# Patient Record
Sex: Male | Born: 1945
Health system: Southern US, Community
[De-identification: ages and names within clinical notes are randomized; demographics above are authoritative.]

## PROBLEM LIST (undated history)

## (undated) DIAGNOSIS — I5042 Chronic combined systolic (congestive) and diastolic (congestive) heart failure: Secondary | ICD-10-CM

## (undated) DIAGNOSIS — I255 Ischemic cardiomyopathy: Secondary | ICD-10-CM

## (undated) DIAGNOSIS — Z951 Presence of aortocoronary bypass graft: Secondary | ICD-10-CM

## (undated) DIAGNOSIS — I4892 Unspecified atrial flutter: Secondary | ICD-10-CM

## (undated) DIAGNOSIS — I4819 Other persistent atrial fibrillation: Secondary | ICD-10-CM

## (undated) DIAGNOSIS — I5022 Chronic systolic (congestive) heart failure: Secondary | ICD-10-CM

## (undated) DIAGNOSIS — K819 Cholecystitis, unspecified: Secondary | ICD-10-CM

## (undated) DIAGNOSIS — I071 Rheumatic tricuspid insufficiency: Secondary | ICD-10-CM

## (undated) DIAGNOSIS — I2510 Atherosclerotic heart disease of native coronary artery without angina pectoris: Secondary | ICD-10-CM

## (undated) DIAGNOSIS — E785 Hyperlipidemia, unspecified: Secondary | ICD-10-CM

## (undated) DIAGNOSIS — G4733 Obstructive sleep apnea (adult) (pediatric): Secondary | ICD-10-CM

## (undated) DIAGNOSIS — N200 Calculus of kidney: Secondary | ICD-10-CM

## (undated) DIAGNOSIS — I119 Hypertensive heart disease without heart failure: Secondary | ICD-10-CM

## (undated) DIAGNOSIS — I340 Nonrheumatic mitral (valve) insufficiency: Secondary | ICD-10-CM

## (undated) HISTORY — DX: Ischemic cardiomyopathy: I25.5

## (undated) HISTORY — DX: Nonrheumatic mitral (valve) insufficiency: I34.0

## (undated) HISTORY — DX: Rheumatic tricuspid insufficiency: I07.1

## (undated) HISTORY — PX: CARDIOVERSION: SHX1299

## (undated) HISTORY — DX: Chronic combined systolic (congestive) and diastolic (congestive) heart failure: I50.42

## (undated) HISTORY — DX: Obstructive sleep apnea (adult) (pediatric): G47.33

## (undated) HISTORY — DX: Presence of aortocoronary bypass graft: Z95.1

---

## 2001-08-04 HISTORY — PX: CARDIAC CATHETERIZATION: SHX172

## 2002-02-14 ENCOUNTER — Inpatient Hospital Stay (HOSPITAL_COMMUNITY): Admission: EM | Admit: 2002-02-14 | Discharge: 2002-02-19 | Payer: Self-pay | Admitting: Cardiology

## 2002-02-15 ENCOUNTER — Encounter: Payer: Self-pay | Admitting: Thoracic Surgery (Cardiothoracic Vascular Surgery)

## 2002-02-16 ENCOUNTER — Encounter: Payer: Self-pay | Admitting: Thoracic Surgery (Cardiothoracic Vascular Surgery)

## 2002-02-16 DIAGNOSIS — Z951 Presence of aortocoronary bypass graft: Secondary | ICD-10-CM

## 2002-02-16 HISTORY — PX: CORONARY ARTERY BYPASS GRAFT: SHX141

## 2002-02-16 HISTORY — DX: Presence of aortocoronary bypass graft: Z95.1

## 2002-02-17 ENCOUNTER — Encounter: Payer: Self-pay | Admitting: Thoracic Surgery (Cardiothoracic Vascular Surgery)

## 2002-02-18 ENCOUNTER — Encounter: Payer: Self-pay | Admitting: Thoracic Surgery (Cardiothoracic Vascular Surgery)

## 2012-07-08 DIAGNOSIS — E7800 Pure hypercholesterolemia, unspecified: Secondary | ICD-10-CM | POA: Diagnosis not present

## 2012-07-08 DIAGNOSIS — Z125 Encounter for screening for malignant neoplasm of prostate: Secondary | ICD-10-CM | POA: Diagnosis not present

## 2012-07-08 DIAGNOSIS — I259 Chronic ischemic heart disease, unspecified: Secondary | ICD-10-CM | POA: Diagnosis not present

## 2012-07-08 DIAGNOSIS — F17200 Nicotine dependence, unspecified, uncomplicated: Secondary | ICD-10-CM | POA: Diagnosis not present

## 2013-08-03 DIAGNOSIS — Z9119 Patient's noncompliance with other medical treatment and regimen: Secondary | ICD-10-CM | POA: Diagnosis not present

## 2013-08-03 DIAGNOSIS — I259 Chronic ischemic heart disease, unspecified: Secondary | ICD-10-CM | POA: Diagnosis not present

## 2013-08-03 DIAGNOSIS — F17200 Nicotine dependence, unspecified, uncomplicated: Secondary | ICD-10-CM | POA: Diagnosis not present

## 2013-08-03 DIAGNOSIS — Z125 Encounter for screening for malignant neoplasm of prostate: Secondary | ICD-10-CM | POA: Diagnosis not present

## 2014-05-16 ENCOUNTER — Inpatient Hospital Stay (HOSPITAL_COMMUNITY)
Admission: EM | Admit: 2014-05-16 | Discharge: 2014-05-18 | DRG: 292 | Disposition: A | Discharge status: Home / Self Care | Payer: Medicare Other | Attending: Cardiovascular Disease | Admitting: Cardiovascular Disease

## 2014-05-16 ENCOUNTER — Emergency Department (HOSPITAL_COMMUNITY): Payer: Medicare Other

## 2014-05-16 ENCOUNTER — Encounter (HOSPITAL_COMMUNITY): Payer: Self-pay | Admitting: Physician Assistant

## 2014-05-16 DIAGNOSIS — I252 Old myocardial infarction: Secondary | ICD-10-CM | POA: Diagnosis not present

## 2014-05-16 DIAGNOSIS — R0609 Other forms of dyspnea: Secondary | ICD-10-CM

## 2014-05-16 DIAGNOSIS — Z7982 Long term (current) use of aspirin: Secondary | ICD-10-CM

## 2014-05-16 DIAGNOSIS — I429 Cardiomyopathy, unspecified: Secondary | ICD-10-CM | POA: Diagnosis present

## 2014-05-16 DIAGNOSIS — E785 Hyperlipidemia, unspecified: Secondary | ICD-10-CM | POA: Diagnosis present

## 2014-05-16 DIAGNOSIS — R000 Tachycardia, unspecified: Secondary | ICD-10-CM | POA: Diagnosis not present

## 2014-05-16 DIAGNOSIS — I4892 Unspecified atrial flutter: Secondary | ICD-10-CM | POA: Diagnosis present

## 2014-05-16 DIAGNOSIS — I2510 Atherosclerotic heart disease of native coronary artery without angina pectoris: Secondary | ICD-10-CM | POA: Diagnosis present

## 2014-05-16 DIAGNOSIS — I5031 Acute diastolic (congestive) heart failure: Secondary | ICD-10-CM | POA: Diagnosis not present

## 2014-05-16 DIAGNOSIS — I059 Rheumatic mitral valve disease, unspecified: Secondary | ICD-10-CM | POA: Diagnosis not present

## 2014-05-16 DIAGNOSIS — R0600 Dyspnea, unspecified: Secondary | ICD-10-CM | POA: Diagnosis not present

## 2014-05-16 DIAGNOSIS — I10 Essential (primary) hypertension: Secondary | ICD-10-CM

## 2014-05-16 DIAGNOSIS — R5383 Other fatigue: Secondary | ICD-10-CM | POA: Diagnosis not present

## 2014-05-16 DIAGNOSIS — R002 Palpitations: Secondary | ICD-10-CM | POA: Diagnosis not present

## 2014-05-16 DIAGNOSIS — Z951 Presence of aortocoronary bypass graft: Secondary | ICD-10-CM

## 2014-05-16 DIAGNOSIS — Z87891 Personal history of nicotine dependence: Secondary | ICD-10-CM | POA: Diagnosis not present

## 2014-05-16 DIAGNOSIS — I5021 Acute systolic (congestive) heart failure: Secondary | ICD-10-CM

## 2014-05-16 DIAGNOSIS — Z9119 Patient's noncompliance with other medical treatment and regimen: Secondary | ICD-10-CM | POA: Diagnosis not present

## 2014-05-16 DIAGNOSIS — R0602 Shortness of breath: Secondary | ICD-10-CM | POA: Diagnosis not present

## 2014-05-16 DIAGNOSIS — I4891 Unspecified atrial fibrillation: Secondary | ICD-10-CM

## 2014-05-16 DIAGNOSIS — I5041 Acute combined systolic (congestive) and diastolic (congestive) heart failure: Principal | ICD-10-CM

## 2014-05-16 LAB — COMPREHENSIVE METABOLIC PANEL
ALBUMIN: 3.4 g/dL — AB (ref 3.5–5.2)
ALT: 52 U/L (ref 0–53)
AST: 28 U/L (ref 0–37)
Alkaline Phosphatase: 66 U/L (ref 39–117)
Anion gap: 11 (ref 5–15)
BILIRUBIN TOTAL: 0.9 mg/dL (ref 0.3–1.2)
BUN: 18 mg/dL (ref 6–23)
CHLORIDE: 105 meq/L (ref 96–112)
CO2: 24 mEq/L (ref 19–32)
CREATININE: 1.01 mg/dL (ref 0.50–1.35)
Calcium: 8.8 mg/dL (ref 8.4–10.5)
GFR calc Af Amer: 87 mL/min — ABNORMAL LOW (ref >90)
GFR calc non Af Amer: 75 mL/min — ABNORMAL LOW (ref >90)
Glucose, Bld: 117 mg/dL — ABNORMAL HIGH (ref 70–99)
Potassium: 4.5 mEq/L (ref 3.7–5.3)
SODIUM: 140 meq/L (ref 137–147)
Total Protein: 6.2 g/dL (ref 6.0–8.3)

## 2014-05-16 LAB — URINALYSIS, ROUTINE W REFLEX MICROSCOPIC
Bilirubin Urine: NEGATIVE
Glucose, UA: NEGATIVE mg/dL
Hgb urine dipstick: NEGATIVE
Ketones, ur: NEGATIVE mg/dL
Leukocytes, UA: NEGATIVE
NITRITE: NEGATIVE
PH: 6 (ref 5.0–8.0)
Protein, ur: NEGATIVE mg/dL
Specific Gravity, Urine: 1.037 — ABNORMAL HIGH (ref 1.005–1.030)
UROBILINOGEN UA: 0.2 mg/dL (ref 0.0–1.0)

## 2014-05-16 LAB — PRO B NATRIURETIC PEPTIDE: Pro B Natriuretic peptide (BNP): 2265 pg/mL — ABNORMAL HIGH (ref 0–125)

## 2014-05-16 LAB — CBC
HCT: 44.1 % (ref 39.0–52.0)
Hemoglobin: 14 g/dL (ref 13.0–17.0)
MCH: 28.8 pg (ref 26.0–34.0)
MCHC: 31.7 g/dL (ref 30.0–36.0)
MCV: 90.7 fL (ref 78.0–100.0)
PLATELETS: 341 10*3/uL (ref 150–400)
RBC: 4.86 MIL/uL (ref 4.22–5.81)
RDW: 14.7 % (ref 11.5–15.5)
WBC: 12.6 10*3/uL — AB (ref 4.0–10.5)

## 2014-05-16 LAB — I-STAT TROPONIN, ED: TROPONIN I, POC: 0.03 ng/mL (ref 0.00–0.08)

## 2014-05-16 LAB — TROPONIN I: Troponin I: <0.3 ng/mL (ref <0.30)

## 2014-05-16 LAB — PROTIME-INR
INR: 1.12 (ref 0.00–1.49)
Prothrombin Time: 14.4 seconds (ref 11.6–15.2)

## 2014-05-16 LAB — LIPID PANEL
Cholesterol: 135 mg/dL (ref 0–200)
HDL: 34 mg/dL — ABNORMAL LOW (ref >39)
LDL CALC: 71 mg/dL (ref 0–99)
Total CHOL/HDL Ratio: 4 RATIO
Triglycerides: 148 mg/dL (ref <150)
VLDL: 30 mg/dL (ref 0–40)

## 2014-05-16 LAB — MRSA PCR SCREENING: MRSA by PCR: NEGATIVE

## 2014-05-16 LAB — APTT: APTT: 31 s (ref 24–37)

## 2014-05-16 LAB — TSH: TSH: 1.83 u[IU]/mL (ref 0.350–4.500)

## 2014-05-16 MED ORDER — HEPARIN (PORCINE) IN NACL 100-0.45 UNIT/ML-% IJ SOLN
1750.0000 [IU]/h | INTRAMUSCULAR | Status: DC
  Administered 2014-05-16: 1500 [IU]/h via INTRAVENOUS
  Administered 2014-05-17: 1750 [IU]/h via INTRAVENOUS
  Filled 2014-05-16 (×4): qty 250

## 2014-05-16 MED ORDER — HEPARIN BOLUS VIA INFUSION
5000.0000 [IU] | Freq: Once | INTRAVENOUS | Status: AC
  Administered 2014-05-16: 5000 [IU] via INTRAVENOUS
  Filled 2014-05-16: qty 5000

## 2014-05-16 MED ORDER — DILTIAZEM LOAD VIA INFUSION
10.0000 mg | Freq: Once | INTRAVENOUS | Status: AC
Start: 2014-05-16 — End: 2014-05-16
  Administered 2014-05-16: 10 mg via INTRAVENOUS
  Filled 2014-05-16: qty 10

## 2014-05-16 MED ORDER — SODIUM CHLORIDE 0.9 % IJ SOLN: 3.0000 mL | INTRAMUSCULAR | Status: DC | PRN

## 2014-05-16 MED ORDER — ALPRAZOLAM 0.25 MG PO TABS
0.2500 mg | ORAL_TABLET | Freq: Two times a day (BID) | ORAL | Status: DC | PRN
  Administered 2014-05-17: 0.25 mg via ORAL
  Filled 2014-05-16: qty 1

## 2014-05-16 MED ORDER — FUROSEMIDE 10 MG/ML IJ SOLN
40.0000 mg | Freq: Two times a day (BID) | INTRAMUSCULAR | Status: DC
  Administered 2014-05-17 (×2): 40 mg via INTRAVENOUS
  Filled 2014-05-16 (×5): qty 4

## 2014-05-16 MED ORDER — ONDANSETRON HCL 4 MG/2ML IJ SOLN
4.0000 mg | Freq: Four times a day (QID) | INTRAMUSCULAR | Status: DC | PRN
Start: 2014-05-16 — End: 2014-05-18

## 2014-05-16 MED ORDER — NITROGLYCERIN IN D5W 200-5 MCG/ML-% IV SOLN
0.0000 ug/min | Freq: Once | INTRAVENOUS | Status: AC
  Administered 2014-05-16: 5 ug/min via INTRAVENOUS
  Filled 2014-05-16: qty 250

## 2014-05-16 MED ORDER — DEXTROSE 5 % IV SOLN
5.0000 mg/h | INTRAVENOUS | Status: DC
  Administered 2014-05-17 (×2): 20 mg/h via INTRAVENOUS
  Filled 2014-05-16 (×3): qty 100

## 2014-05-16 MED ORDER — DILTIAZEM LOAD VIA INFUSION
5.0000 mg | Freq: Once | INTRAVENOUS | Status: AC
  Administered 2014-05-16: 5 mg via INTRAVENOUS
  Filled 2014-05-16: qty 5

## 2014-05-16 MED ORDER — FUROSEMIDE 10 MG/ML IJ SOLN
40.0000 mg | Freq: Once | INTRAMUSCULAR | Status: AC
  Administered 2014-05-16: 40 mg via INTRAVENOUS
  Filled 2014-05-16: qty 4

## 2014-05-16 MED ORDER — IOHEXOL 350 MG/ML SOLN
100.0000 mL | Freq: Once | INTRAVENOUS | Status: AC | PRN
  Administered 2014-05-16: 100 mL via INTRAVENOUS

## 2014-05-16 MED ORDER — NITROGLYCERIN 0.4 MG SL SUBL: 0.4000 mg | SUBLINGUAL_TABLET | SUBLINGUAL | Status: DC | PRN

## 2014-05-16 MED ORDER — POTASSIUM CHLORIDE CRYS ER 20 MEQ PO TBCR
20.0000 meq | EXTENDED_RELEASE_TABLET | Freq: Once | ORAL | Status: AC
  Administered 2014-05-16: 20 meq via ORAL
  Filled 2014-05-16: qty 1

## 2014-05-16 MED ORDER — ASPIRIN EC 81 MG PO TBEC
81.0000 mg | DELAYED_RELEASE_TABLET | Freq: Every day | ORAL | Status: DC
  Administered 2014-05-17: 81 mg via ORAL
  Filled 2014-05-16 (×3): qty 1

## 2014-05-16 MED ORDER — ZOLPIDEM TARTRATE 5 MG PO TABS: 5.0000 mg | ORAL_TABLET | Freq: Every evening | ORAL | Status: DC | PRN

## 2014-05-16 MED ORDER — SODIUM CHLORIDE 0.9 % IV SOLN: 250.0000 mL | INTRAVENOUS | Status: DC | PRN

## 2014-05-16 MED ORDER — ACETAMINOPHEN 325 MG PO TABS: 650.0000 mg | ORAL_TABLET | ORAL | Status: DC | PRN

## 2014-05-16 MED ORDER — SODIUM CHLORIDE 0.9 % IJ SOLN
3.0000 mL | Freq: Two times a day (BID) | INTRAMUSCULAR | Status: DC
  Administered 2014-05-16 – 2014-05-18 (×4): 3 mL via INTRAVENOUS

## 2014-05-16 NOTE — ED Notes (Signed)
Patient transported to X-ray 

## 2014-05-16 NOTE — ED Notes (Signed)
Patient transported to CT 

## 2014-05-16 NOTE — ED Notes (Signed)
Attempted Report 

## 2014-05-16 NOTE — Progress Notes (Signed)
ANTICOAGULATION CONSULT NOTE - Initial Consult  Pharmacy Consult for heparin Indication: Atrial fibrillation   No Known Allergies  Patient Measurements: Height: 5\' 11"  (180.3 cm) Weight: 231 lb (104.781 kg) IBW/kg (Calculated) : 75.3 Heparin Dosing Weight: 97 kg  Vital Signs: Temp: 98 F (36.7 C) (10/13 1157) Temp Source: Oral (10/13 1157) BP: 136/99 mmHg (10/13 1600) Pulse Rate: 135 (10/13 1600)  Labs:  Recent Labs  05/16/14 1212 05/16/14 1547  HGB 14.0  --   HCT 44.1  --   PLT 341  --   APTT  --  31  LABPROT  --  14.4  INR  --  1.12  CREATININE 1.01  --   TROPONINI  --  <0.30    Estimated Creatinine Clearance: 87.4 ml/min (by C-G formula based on Cr of 1.01).   Medical History: Past Medical History  Diagnosis Date  . MI (myocardial infarction)     silent MI (?x3) prior to CABG    Medications: No PTA antioagulation  Assessment: 68 y/o M w/ hx of CAD. ECG was taken at family physician that showed atrial fibrillation and flutter with RVR. Also endorses volume overload. Pulse 135, BP 136/99, WBC 12.6, CrCl 87.4 mL/min. Hgb, Hct, INR WNL.  Goal of Therapy:  Goal heparin level 0.3 - 0.7 Monitor platelets by anticoagulation protocol: yes  Plan:  Heparin IV 5000u bolus x 1, then heparin 1500 units/hr F/u CBC, 6 hour heparin level, daily heparin level  Marisue BrooklynGazda, Ciin Brazzel 05/16/2014,4:59 PM

## 2014-05-16 NOTE — ED Notes (Signed)
Verbal order to D/C Nitroglycerin

## 2014-05-16 NOTE — ED Provider Notes (Signed)
Medical screening examination/treatment/procedure(s) were conducted as a shared visit with non-physician practitioner(s) and myself.  I personally evaluated the patient during the encounter.   EKG Interpretation   Date/Time:  Tuesday May 16 2014 12:58:57 EDT Ventricular Rate:  138 PR Interval:  60 QRS Duration: 98 QT Interval:  297 QTC Calculation: 450 R Axis:   95 Text Interpretation:  Sinus or ectopic atrial tachycardia Ventricular  premature complex Probable anterolateral infarct, age indeterm ED  PHYSICIAN INTERPRETATION AVAILABLE IN [facility] Confirmed by TEST,  Record ([id number]) on 05/18/2014 7:04:35 AM     68yM with DOE. Progressively worsening over past two weeks. Not sleeping well at night and has actually moved to sleeping in a recliner. LE edema, but says he just noticed this today. Denies any CP. No cough. No fever or chills. Past hx of CAD. [place]. Has not followed up with cardiology since then. Hypertensive, tachycardic. Sinus tach vs afib/flutter. Diuretics. BP control. Admit.   Raeford RazorStephen Dayshaun Whobrey, MD 05/18/14 [phone]

## 2014-05-16 NOTE — ED Provider Notes (Signed)
CSN: [id number]     Arrival date & time 05/16/14  1135 History   First MD Initiated Contact with Patient 05/16/14 1207     Chief Complaint  Patient presents with  . Chest Pain     (Consider location/radiation/quality/duration/timing/severity/associated sxs/prior Treatment) HPI Pt is a 68yo male with hx of quadruple bypass in 2003, otherwise, "healthy" states he has f/u with PCP but not a cardiologist since as he has not had any issues until about 2 weeks ago, developed gradually worsening DOE.  States he becomes SOB while going to get his mail and doing other activities of daily living.  Was seen by his PCP today who sent him to ED for further evaluation of atrial flutter.  Pt states he noticed "the other day" his pulse was racing.  Denies chest pain, fever, chills, cough or congestion. Denies seeing a cardiologist since bypass in 2003.  No past medical history on file. No past surgical history on file. No family history on file. History  Substance Use Topics  . Smoking status: Not on file  . Smokeless tobacco: Not on file  . Alcohol Use: Not on file    Review of Systems  Constitutional: Positive for fatigue ( DOE). Negative for fever, chills and diaphoresis.  Respiratory: Negative for cough and shortness of breath.   Cardiovascular: Positive for palpitations ( "heart racing"). Negative for chest pain and leg swelling.  Gastrointestinal: Negative for nausea, vomiting, abdominal pain and diarrhea.  All other systems reviewed and are negative.     Allergies  Review of patient's allergies indicates no known allergies.  Home Medications   Prior to Admission medications   Medication Sig Start Date End Date Taking? Authorizing Provider  aspirin EC 81 MG tablet Take 81 mg by mouth daily.   Yes Historical Provider, MD   BP 129/103  Pulse 132  Temp(Src) 98 F (36.7 C) (Oral)  Resp 24  Ht 5\' 11"  (1.803 m)  Wt 231 lb (104.781 kg)  BMI 32.23 kg/m2  SpO2 95% Physical Exam   Nursing note and vitals reviewed. Constitutional: He appears well-developed and well-nourished.  Pt lying in exam bed, NAD. Appears well, non-toxic.  HENT:  Head: Normocephalic and atraumatic.  Eyes: Conjunctivae are normal. No scleral icterus.  Neck: Normal range of motion. Neck supple.  Cardiovascular: Regular rhythm and normal heart sounds.  Tachycardia present.   Pulmonary/Chest: Effort normal and breath sounds normal. No respiratory distress. He has no wheezes. He has no rales. He exhibits no tenderness.  No respiratory distress, able to speak in full sentences w/o difficulty. Lungs: CTAB  Abdominal: Soft. Bowel sounds are normal. He exhibits no distension and no mass. There is no tenderness. There is no rebound and no guarding.  Musculoskeletal: Normal range of motion.  Neurological: He is alert.  Skin: Skin is warm and dry.    ED Course  Procedures (including critical care time) Labs Review Labs Reviewed  CBC - Abnormal; Notable for the following:    WBC 12.6 (*)    All other components within normal limits  COMPREHENSIVE METABOLIC PANEL - Abnormal; Notable for the following:    Glucose, Bld 117 (*)    Albumin 3.4 (*)    GFR calc non Af Amer 75 (*)    GFR calc Af Amer 87 (*)    All other components within normal limits  URINALYSIS, ROUTINE W REFLEX MICROSCOPIC  I-STAT TROPOININ, ED    Imaging Review Dg Chest 2 View  05/16/2014   CLINICAL  DATA:  Shortness breath, tachycardia  EXAM: CHEST  2 VIEW  COMPARISON:  None.  FINDINGS: There is mild bilateral interstitial thickening. There are trace bilateral pleural effusions. There is no pneumothorax. There is mild cardiomegaly. There is evidence of prior CABG.  The osseous structures are unremarkable.  IMPRESSION: Findings concerning for mild pulmonary edema.   Electronically Signed   By: Elige KoHetal  Patel   On: 05/16/2014 12:57   Ct Angio Chest Pe W/cm &/or Wo Cm  05/16/2014   CLINICAL DATA:  Dyspnea on exertion  EXAM: CT  ANGIOGRAPHY CHEST WITH CONTRAST  TECHNIQUE: Multidetector CT imaging of the chest was performed using the standard protocol during bolus administration of intravenous contrast. Multiplanar CT image reconstructions and MIPs were obtained to evaluate the vascular anatomy.  CONTRAST:  100mL OMNIPAQUE IOHEXOL 350 MG/ML SOLN  COMPARISON:  Chest radiograph May 16, 2014  FINDINGS: There is no demonstrable pulmonary embolus. There is no appreciable thoracic aortic aneurysm. Patient is status post coronary artery bypass grafting. Heart is mildly enlarged. The pericardium is not thickened.  There are moderate pleural effusions bilaterally. There is patchy consolidation in each lung base. There is interstitial edema bilaterally, primarily in the lower lobe regions but present diffusely.  There are multiple subcentimeter mediastinal lymph nodes, but there is no frank adenopathy by size criteria.  In the visualized upper abdomen, there is a cyst in the anterior segment right lobe of the liver measuring 1.7 x 1.4 cm. There is an adrenal adenoma on the left, measuring 2.6 x 1.7 cm. A second left adrenal adenoma measures 2.2 x 1.9 cm. There is atherosclerotic change in the upper abdominal aorta. There are no blastic or lytic bone lesions. There is a hemangioma in the T7 vertebral body. The visualized thyroid appears within normal limits.  Review of the MIP images confirms the above findings.  IMPRESSION: No demonstrable pulmonary embolus.  Evidence of congestive heart failure. Superimposed consolidation in each lung base.  No appreciable adenopathy.  Hemangioma T7 vertebral body.  Benign-appearing left adrenal adenomas.   Electronically Signed   By: Bretta BangWilliam  Woodruff M.D.   On: 05/16/2014 14:26     EKG Interpretation None      MDM   Final diagnoses:  DOE (dyspnea on exertion)    Pt with hx of CAD presenting to ED with c/o DOE.  Pt is hypertensive and tachycardic in ED.  Initial concern for aflutter, however, EKG  reviewed by Dr. Juleen ChinaKohut, not concerned for afib/aflutter.  Concern for PE due to pulse in 130s.  Pt denies CP.  Appears well, NAD. CXR: findings concerning for mild pulmonary edema. CT angio chest: no demonstrable PE. Evidence of congestive heart failure.  Superimposed consolidation in each lung base.  Discussed with Dr. Juleen ChinaKohut, will give 40mg  IV lasix and consult cardiology  2:48 PM Consulted with Trish, cardiology, who agreed to send someone from cardiology to evaluate pt to help determine pt's disposition.   Pt admitted by cardiology.    Junius Finnerrin O'Malley, PA-C 05/17/14 1237

## 2014-05-16 NOTE — H&P (Signed)
History and Physical   Patient ID: Stanley [name]: [id number], DOB/AGE: 04/29/1946 68 y.o. Date of Encounter: 05/16/2014  Primary Physician: Dr. Jeanie Sewer x 2 years Primary Cardiologist: none in over 10 years  Chief Complaint: SOB and weakness  HPI: Stanley Wells is a 68 y.o. male with a history of CAD. He last saw cardiology shortly after his bypass surgery in 2003. The patient states at that time he was told he would be lucky to live 7 years.  He has done well since then. He sees his family doctor every year. His family doctor checks his cholesterol and says it is okay. His family physician requests that he followup with cardiology, and says there are medications he should be taking, but the patient doesn't know which ones and denies any history of diabetes, hypertension, or hyperlipidemia. He does not drink to excess. He quit smoking at the time of his bypass surgery and has not restarted. He exercises regularly, walking 4 miles a day. He never gets chest pain.  Several weeks ago, he began noticing increasing dyspnea on exertion. He did not notice any weight gain. He denies lower extremity edema. He felt that he had increased abdominal girth. He developed orthopnea and the dyspnea on exertion worsened to the point that he could not walk from room-room in the house without getting short of breath. He then develop PND.   He never had palpitations or any awareness of an irregular heart rate. Yesterday, he accidentally became aware that his heart rate was rapid and irregular. He then contacted his family physician.  Today, he went to his family physician and an ECG showed atrial fibrilation/flutter with rapid ventricular response. He also had significant volume overload by exam. He was hypertensive. He was sent to [facility] to the emergency room. In the emergency room, he has received IV Lasix with good urine output and is feeling much better. He has been started on a nitroglycerin  drip, with improvement in his blood pressure.   Past Medical History  Diagnosis Date  . MI (myocardial infarction)     silent MI (?x3) prior to CABG    Surgical History:  Past Surgical History  Procedure Laterality Date  . Coronary artery bypass graft  2003    x 5     I have reviewed the patient's current medications. Prior to Admission medications   Medication Sig Start Date End Date Taking? Authorizing Provider  aspirin EC 81 MG tablet Take 81 mg by mouth daily.   Yes Historical Provider, MD   Allergies: No Known Allergies  History   Social History  . Marital Status: Married    Spouse Name: N/A    Number of Children: N/A  . Years of Education: N/A   Occupational History  . Retired    Social History Main Topics  . Smoking status: Former Smoker -- 1.00 packs/day for 40 years    Types: Cigarettes    Quit date: 02/02/2002  . Smokeless tobacco: Never Used  . Alcohol Use: Yes     Comment: 6 pack of beer a week  . Drug Use: No  . Sexual Activity: Not on file   Other Topics Concern  . Not on file   Social History Narrative   Lives in [place] with wife.    Family History  Problem Relation Age of Onset  . Cancer Mother   . Cancer Father    Family Status  Relation Status Death Age  .  Mother Deceased 13    Cancer  . Father Deceased 26    Cancer    Review of Systems:   Full 14-point review of systems otherwise negative except as noted above.  Physical Exam: Blood pressure 136/99, pulse 135, temperature 98 F (36.7 C), temperature source Oral, resp. rate 27, height 5\' 11"  (1.803 m), weight 231 lb (104.781 kg), SpO2 93.00%. General: Well developed, well nourished,male with shortness of breath Head: Normocephalic, atraumatic, sclera non-icteric, no xanthomas, nares are without discharge. Dentition: Good Neck: No carotid bruits. JVD elevated to jaw. No thyromegally Lungs: Good expansion bilaterally. without wheezes or rhonchi. Bibasilar Rales Heart: IRRegular  and rapid rate and rhythm with S1 S2.  No S3 or S4.  No murmur, no rubs, or gallops appreciated. Abdomen: Firm, non-tender, non-distended with normoactive bowel sounds. No hepatomegaly. No rebound/guarding. No obvious abdominal masses. Positive hepatojugular reflux Msk:  Strength and tone appear normal for age. No joint deformities or effusions, no spine or costo-vertebral angle tenderness. Extremities: No clubbing or cyanosis. Trace pedal edema.  Distal pedal pulses are 2+ in 4 extrem Neuro: Alert and oriented X 3. Moves all extremities spontaneously. No focal deficits noted. Psych:  Responds to questions appropriately with a normal affect. Skin: No rashes or lesions noted  Labs:   Lab Results  Component Value Date   WBC 12.6* 05/16/2014   HGB 14.0 05/16/2014   HCT 44.1 05/16/2014   MCV 90.7 05/16/2014   PLT 341 05/16/2014       Recent Labs Lab 05/16/14 1212  NA 140  K 4.5  CL 105  CO2 24  BUN 18  CREATININE 1.01  CALCIUM 8.8  PROT 6.2  BILITOT 0.9  ALKPHOS 66  ALT 52  AST 28  GLUCOSE 117*    Recent Labs  05/16/14 1218  TROPIPOC 0.03   Radiology/Studies: Dg Chest 2 View 05/16/2014   CLINICAL DATA:  Shortness breath, tachycardia  EXAM: CHEST  2 VIEW  COMPARISON:  None.  FINDINGS: There is mild bilateral interstitial thickening. There are trace bilateral pleural effusions. There is no pneumothorax. There is mild cardiomegaly. There is evidence of prior CABG.  The osseous structures are unremarkable.  IMPRESSION: Findings concerning for mild pulmonary edema.   Electronically Signed   By: Elige Ko   On: 05/16/2014 12:57   Ct Angio Chest Pe W/cm &/or Wo Cm 05/16/2014   CLINICAL DATA:  Dyspnea on exertion  EXAM: CT ANGIOGRAPHY CHEST WITH CONTRAST  TECHNIQUE: Multidetector CT imaging of the chest was performed using the standard protocol during bolus administration of intravenous contrast. Multiplanar CT image reconstructions and MIPs were obtained to evaluate the  vascular anatomy.  CONTRAST:  OMNIPAQUE IOHEXOL 350 MG/ML SOLN  COMPARISON:  Chest radiograph May 16, 2014  FINDINGS: There is no demonstrable pulmonary embolus. There is no appreciable thoracic aortic aneurysm. Patient is status post coronary artery bypass grafting. Heart is mildly enlarged. The pericardium is not thickened.  There are moderate pleural effusions bilaterally. There is patchy consolidation in each lung base. There is interstitial edema bilaterally, primarily in the lower lobe regions but present diffusely.  There are multiple subcentimeter mediastinal lymph nodes, but there is no frank adenopathy by size criteria.  In the visualized upper abdomen, there is a cyst in the anterior segment right lobe of the liver measuring 1.7 x 1.4 cm. There is an adrenal adenoma on the left, measuring 2.6 x 1.7 cm. A second left adrenal adenoma measures 2.2 x 1.9 cm. There  is atherosclerotic change in the upper abdominal aorta. There are no blastic or lytic bone lesions. There is a hemangioma in the T7 vertebral body. The visualized thyroid appears within normal limits.  Review of the MIP images confirms the above findings.  IMPRESSION: No demonstrable pulmonary embolus.  Evidence of congestive heart failure. Superimposed consolidation in each lung base.  No appreciable adenopathy.  Hemangioma T7 vertebral body.  Benign-appearing left adrenal adenomas.   Electronically Signed   By: Bretta BangWilliam  Woodruff M.D.   On: 05/16/2014 14:26   ECG: Atrial flutter, rapid ventricular response, heart rate greater than 140  ASSESSMENT AND PLAN:  Principal Problem:   Acute congestive heart failure - he has had good response to IV Lasix. Continue diuresis, monitor weights, intake/output, and renal function. Get an echocardiogram to evaluate left ventricular function.   Active Problems:   Atrial flutter with rapid ventricular response - initial rate control with IV Cardizem, add a beta blocker as blood pressure and heart  rate will tolerate.    Hypertension goal BP (blood pressure) < 130/80 - patient denies any known history of hypertension. However blood pressure significantly elevated on admission. We'll continue to monitor closely, should improve with diuresis. Continue IV nitroglycerin for now.    Anticoagulation: He may need an ischemic evaluation. Will add heparin for now, consideration can be given to Coumadin versus NOAC Once the need for ischemic eval was ruled out.  Currently will need to be admitted to step down.  Melida QuitterSigned, Rhonda Barrett, PA-C 05/16/2014 4:16 PM Beeper [phone]   Patient seen and examined. Agree with assessment and plan.  Stanley Wells is a 68 year old Caucasian male, who underwent CABG revascularization surgery x5 in 2003, by Dr. Cornelius Moraswen.  He tells me he has seen a cardiologist only once after his operation and essentially has not had any Cardiologic followup for the past 12 years.  He had not been on any medication.  He has a history of remote tobacco use, but quit smoking at the time of his CABG surgery.  He typically would walk 4 miles per day without chest pain, development.  Of note, at the time of his cardiac presentation in 2003 he did not experience chest pain.  He did note profound diaphoresis and also noted some exertional dyspnea.  For the past several weeks he has noticed that he is more short of breath than he had been.  Yesterday he became aware that his heart rhythm was irregular and beating fast.  He was evaluated by his primary physician, who noted atrial fib/flutter with a rapid ventricular response.  He presented to the emergency room today with A. fib/flutter with a ventricular rate in the 130s.  He denies chest pain.  Physical exam is notable for significant mesomorphic body habitus.  He has a thick neck.  There is no wheezing.  Rhythm is irregularly irregular with a rate of approximately 130 beats per minute with a 1/6 systolic murmur.  Her moderate central adiposity.   Pulses were 2+.  He did not have significant edema.  Upon further questioning with his wife the patient does snore significantly and his body habitus is also highly suggestive of obstructive sleep apnea, which may be playing a role in his development of atrial fibrillation.  Presently, we will obtain a 2-D echo Doppler study.  He is hypertensive with a blood pressure 140/106.  He'll be started on titrating doses of IV nitroglycerin and will also be started on Cardizem bolus plus infusion.  We will  anticoagulate him initially with heparin.  Cardiac enzymes will be obtained.  If enzymes are positive, consideration for definitive cardiac catheterization may be necessary to delineate his coronary anatomy.  If enzymes are negative and ischemic evaluation should be performed with at least nuclear stress testing.  The patient also should be evaluated for obstructive sleep apnea, particularly with his cardiovascular comorbidities, and atrial fib/flutter arrhythmia.   Stanley Biharihomas A. Lola Lofaro, MD, [facility] 05/16/2014 4:55 PM

## 2014-05-16 NOTE — ED Notes (Signed)
Started to have fluttering in chest a couple ofweeks ago worse the last couple of daaysa went to dr and was sent here

## 2014-05-16 NOTE — ED Notes (Signed)
Meal tray at bedside.  

## 2014-05-16 NOTE — ED Notes (Signed)
Denies currently being SOB or Chest Pain.

## 2014-05-16 NOTE — ED Notes (Signed)
Verified with Pharmacy compatibility

## 2014-05-16 NOTE — ED Notes (Signed)
PA at bedside.

## 2014-05-16 NOTE — Progress Notes (Signed)
I have reviewed this note and agree with the assessment and plan.

## 2014-05-17 ENCOUNTER — Encounter (HOSPITAL_COMMUNITY): Payer: Self-pay | Admitting: General Practice

## 2014-05-17 DIAGNOSIS — I059 Rheumatic mitral valve disease, unspecified: Secondary | ICD-10-CM

## 2014-05-17 DIAGNOSIS — I5031 Acute diastolic (congestive) heart failure: Secondary | ICD-10-CM

## 2014-05-17 LAB — HEPARIN LEVEL (UNFRACTIONATED)
Heparin Unfractionated: 0.51 IU/mL (ref 0.30–0.70)
Heparin Unfractionated: <0.1 IU/mL — ABNORMAL LOW (ref 0.30–0.70)

## 2014-05-17 LAB — BASIC METABOLIC PANEL
Anion gap: 13 (ref 5–15)
BUN: 18 mg/dL (ref 6–23)
CO2: 23 mEq/L (ref 19–32)
Calcium: 8.5 mg/dL (ref 8.4–10.5)
Chloride: 101 mEq/L (ref 96–112)
Creatinine, Ser: 0.89 mg/dL (ref 0.50–1.35)
GFR calc non Af Amer: 87 mL/min — ABNORMAL LOW (ref >90)
Glucose, Bld: 124 mg/dL — ABNORMAL HIGH (ref 70–99)
POTASSIUM: 3.8 meq/L (ref 3.7–5.3)
SODIUM: 137 meq/L (ref 137–147)

## 2014-05-17 LAB — CBC
HEMATOCRIT: 41.2 % (ref 39.0–52.0)
Hemoglobin: 13.4 g/dL (ref 13.0–17.0)
MCH: 29.4 pg (ref 26.0–34.0)
MCHC: 32.5 g/dL (ref 30.0–36.0)
MCV: 90.4 fL (ref 78.0–100.0)
Platelets: 303 10*3/uL (ref 150–400)
RBC: 4.56 MIL/uL (ref 4.22–5.81)
RDW: 14.9 % (ref 11.5–15.5)
WBC: 11.6 10*3/uL — ABNORMAL HIGH (ref 4.0–10.5)

## 2014-05-17 LAB — TROPONIN I

## 2014-05-17 LAB — HEMOGLOBIN A1C
Hgb A1c MFr Bld: 6.4 % — ABNORMAL HIGH (ref <5.7)
Mean Plasma Glucose: 137 mg/dL — ABNORMAL HIGH (ref <117)

## 2014-05-17 MED ORDER — ATORVASTATIN CALCIUM 20 MG PO TABS
20.0000 mg | ORAL_TABLET | Freq: Every day | ORAL | Status: DC
  Administered 2014-05-17: 20 mg via ORAL
  Filled 2014-05-17 (×2): qty 1

## 2014-05-17 MED ORDER — DILTIAZEM HCL 90 MG PO TABS
90.0000 mg | ORAL_TABLET | Freq: Four times a day (QID) | ORAL | Status: DC
  Administered 2014-05-17 (×4): 90 mg via ORAL
  Filled 2014-05-17 (×9): qty 1

## 2014-05-17 MED ORDER — PERFLUTREN LIPID MICROSPHERE
1.0000 mL | INTRAVENOUS | Status: AC | PRN
  Administered 2014-05-17: 2 mL via INTRAVENOUS
  Filled 2014-05-17: qty 10

## 2014-05-17 MED ORDER — HEPARIN BOLUS VIA INFUSION
2000.0000 [IU] | Freq: Once | INTRAVENOUS | Status: AC
  Administered 2014-05-17: 2000 [IU] via INTRAVENOUS
  Filled 2014-05-17: qty 2000

## 2014-05-17 MED ORDER — APIXABAN 5 MG PO TABS
5.0000 mg | ORAL_TABLET | Freq: Two times a day (BID) | ORAL | Status: DC
  Administered 2014-05-17 – 2014-05-18 (×3): 5 mg via ORAL
  Filled 2014-05-17 (×5): qty 1

## 2014-05-17 NOTE — Progress Notes (Signed)
Subjective:  Feeling better this morning. Less SOB. Rate better controlled.   Objective:  Vital Signs in the last 24 hours: Temp:  [97.4 F (36.3 C)-98.4 F (36.9 C)] 97.4 F (36.3 C) (10/14 0802) Pulse Rate:  [42-140] 85 (10/14 0802) Resp:  [15-31] 19 (10/14 0802) BP: (99-154)/(73-118) 118/74 mmHg (10/14 0802) SpO2:  [92 %-98 %] 92 % (10/14 0802) Weight:  [213 lb 6.4 oz (96.798 kg)-231 lb (104.781 kg)] 213 lb 6.4 oz (96.798 kg) (10/14 0444)  Intake/Output from previous day: 10/13 0701 - 10/14 0700 In: 642 [I.V.:642] Out: 2400 [Urine:2400]   Physical Exam: General: Well developed, well nourished, in no acute distress. Head:  Normocephalic and atraumatic. Lungs: Clear to auscultation and percussion. Heart: Normal S1 and S2. Mildly irregular No murmur, rubs or gallops. Prior bypass scar Abdomen: soft, non-tender, positive bowel sounds. Overweight Extremities: No clubbing or cyanosis. No edema. Neurologic: Alert and oriented x 3.    Lab Results:  Recent Labs  05/16/14 1212 05/17/14 0002  WBC 12.6* 11.6*  HGB 14.0 13.4  PLT 341 303    Recent Labs  05/16/14 1212 05/17/14 0002  NA 140 137  K 4.5 3.8  CL 105 101  CO2 24 23  GLUCOSE 117* 124*  BUN 18 18  CREATININE 1.01 0.89    Recent Labs  05/16/14 2139 05/17/14 0339  TROPONINI <0.30 <0.30   Hepatic Function Panel  Recent Labs  05/16/14 1212  PROT 6.2  ALBUMIN 3.4*  AST 28  ALT 52  ALKPHOS 66  BILITOT 0.9    Recent Labs  05/16/14 1548  CHOL 135     Imaging: Dg Chest 2 View  05/16/2014   CLINICAL DATA:  Shortness breath, tachycardia  EXAM: CHEST  2 VIEW  COMPARISON:  None.  FINDINGS: There is mild bilateral interstitial thickening. There are trace bilateral pleural effusions. There is no pneumothorax. There is mild cardiomegaly. There is evidence of prior CABG.  The osseous structures are unremarkable.  IMPRESSION: Findings concerning for mild pulmonary edema.   Electronically Signed    By: Elige KoHetal  Patel   On: 05/16/2014 12:57   Ct Angio Chest Pe W/cm &/or Wo Cm  05/16/2014   CLINICAL DATA:  Dyspnea on exertion  EXAM: CT ANGIOGRAPHY CHEST WITH CONTRAST  TECHNIQUE: Multidetector CT imaging of the chest was performed using the standard protocol during bolus administration of intravenous contrast. Multiplanar CT image reconstructions and MIPs were obtained to evaluate the vascular anatomy.  CONTRAST:  100mL OMNIPAQUE IOHEXOL 350 MG/ML SOLN  COMPARISON:  Chest radiograph May 16, 2014  FINDINGS: There is no demonstrable pulmonary embolus. There is no appreciable thoracic aortic aneurysm. Patient is status post coronary artery bypass grafting. Heart is mildly enlarged. The pericardium is not thickened.  There are moderate pleural effusions bilaterally. There is patchy consolidation in each lung base. There is interstitial edema bilaterally, primarily in the lower lobe regions but present diffusely.  There are multiple subcentimeter mediastinal lymph nodes, but there is no frank adenopathy by size criteria.  In the visualized upper abdomen, there is a cyst in the anterior segment right lobe of the liver measuring 1.7 x 1.4 cm. There is an adrenal adenoma on the left, measuring 2.6 x 1.7 cm. A second left adrenal adenoma measures 2.2 x 1.9 cm. There is atherosclerotic change in the upper abdominal aorta. There are no blastic or lytic bone lesions. There is a hemangioma in the T7 vertebral body. The visualized thyroid appears within normal limits.  Review of the MIP images confirms the above findings.  IMPRESSION: No demonstrable pulmonary embolus.  Evidence of congestive heart failure. Superimposed consolidation in each lung base.  No appreciable adenopathy.  Hemangioma T7 vertebral body.  Benign-appearing left adrenal adenomas.   Electronically Signed   By: Bretta BangWilliam  Woodruff M.D.   On: 05/16/2014 14:26   Personally viewed.   Telemetry:atrial flutter 70's, occasional NSVT vs aberancy.   Personally viewed.   EKG:  aflutter  Cardiac Studies:  ECHO p  Scheduled Meds: . aspirin EC  81 mg Oral Daily  . furosemide  40 mg Intravenous BID  . sodium chloride  3 mL Intravenous Q12H   Continuous Infusions: . diltiazem (CARDIZEM) infusion 20 mg/hr (05/17/14 0444)  . heparin 1,750 Units/hr (05/17/14 0444)   PRN Meds:.sodium chloride, acetaminophen, ALPRAZolam, nitroGLYCERIN, ondansetron (ZOFRAN) IV, sodium chloride, zolpidem  Assessment/Plan:  Principal Problem:   Acute congestive heart failure Active Problems:   Atrial flutter with rapid ventricular response   Hypertension goal BP (blood pressure) < 130/80   Atrial fibrillation, rapid  68 year old with CAD, CABG 2003, new onset atrial flutter, acute diastolic HF, HTN, HL.   1) Atrial flutter, typical (neg deflection 2,3,F)- better rate controlled on diltiazem. Will change to PO. Trop neg. Will start Eliquis 5mg  PO BID. Stop ASA. If remains in flutter after 3 weeks, cardioversion. Able to rate control currently.   2) Acute diastolic heart failure-checking echocardiogram. IV Lasix has helped out. Will change to by mouth. Pro BNP was 2200.  3) Hypertension-currently well controlled.  4) Hyperlipidemia-LDL currently 71. With prior bypass surgery, I would like for him to be on statin therapy despite excellent LDL. I will start atorvastatin 20 mg.  5) CAD post bypass surgery-has been doing very well walking up to 4 miles a day, exercises, no anginal symptoms. Troponin has been normal. No need for further ischemic valuation. Stopping heparin IV. Transitioning him to Eliquis anticoagulation.  Patient and his wife are agreeable to plan Transfer to floor.    SKAINS, MARK 05/17/2014, 9:38 AM

## 2014-05-17 NOTE — Progress Notes (Signed)
UR completed Anesha Hackert K. Sybella Harnish, RN, BSN, MSHL, CCM  05/17/2014 8:55 PM

## 2014-05-17 NOTE — Care Management Note (Addendum)
  Page 2 of 2   05/18/2014     2:47:41 PM CARE MANAGEMENT NOTE 05/18/2014  Patient:  Stanley Stanley Wells,Stanley Stanley Wells   Account Number:  [id number]  Date Initiated:  05/17/2014  Documentation initiated by:  Kenadee Gates  Subjective/Objective Assessment:   CHF; ECG: Atrial flutter, rapid ventricular response, heart rate greater than 140     Action/Plan:   CM to follow for disposition needs   Anticipated DC Date:  05/20/2014   Anticipated DC Plan:  HOME/SELF CARE      DC Planning Services  CM consult  Medication Assistance      Choice offered to / List presented to:             Status of service:  Completed, signed off Medicare Important Message given?  NA - LOS <3 / Initial given by admissions (If response is "NO", the following Medicare IM given date fields will be blank) Date Medicare IM given:   Medicare IM given by:   Date Additional Medicare IM given:   Additional Medicare IM given by:    Discharge Disposition:  HOME/SELF CARE  Per UR Regulation:  Reviewed for med. necessity/level of care/duration of stay  If discussed at Long Length of Stay Meetings, dates discussed:    Comments:  Stanley Degan RN, BSN, MSHL, CCM  Nurse - Case Manager,  ([facility] [id number])  [phone]  05/18/2014 Social:  From home with wife Stanley Stanley Wells,Stanley Stanley Wells [phone] Patient denies any mobility needs and independent with all ADLs.PCP:  Dr.John  Stanley Wells [address] [place], [place]. [id number] Phone: [phone] Fax: [phone] Last appt 05/16/2014 prn visit for current issue and generally sees yearly and prn Medications:    States hx/o never being sick and never needing to take any medications. Patient has no healthcare coverage for medicatiosn and has pending appt for 05/31/2014 to discuss [employer] Med coverage options. CM provided education on $4.00 medication list options at [employer]. CM advised patient that CM would review d/Stanley Wells meds and review for 4.00 med use  options.   Patient d/Stanley Wells without CM notification Med list reviewed and Eliquis ordered. CM notified charge nurse of event. Charge nurse contacted patient at home # and advised CM would contact. CM attempted calls x 2 and left voice message requesting return call back. CM would like to provide 30 day free sample card to patient along,  patient assistance application, $4.00 med list and discuss meds Rx on $4.00 list.

## 2014-05-17 NOTE — Progress Notes (Addendum)
ANTICOAGULATION CONSULT NOTE - Follow Up Consult  Pharmacy Consult for Heparin  Indication: atrial fibrillation  No Known Allergies  Patient Measurements: Height: 5\' 11"  (180.3 cm) Weight: 231 lb (104.781 kg) IBW/kg (Calculated) : 75.3  Vital Signs: Temp: 98.4 F (36.9 C) (10/14 0030) Temp Source: Oral (10/14 0030) BP: 115/86 mmHg (10/14 0030) Pulse Rate: 42 (10/14 0030)  Labs:  Recent Labs  05/16/14 1212 05/16/14 1547 05/16/14 2139 05/17/14 0002  HGB 14.0  --   --  13.4  HCT 44.1  --   --  41.2  PLT 341  --   --  303  APTT  --  31  --   --   LABPROT  --  14.4  --   --   INR  --  1.12  --   --   HEPARINUNFRC  --   --   --  <0.10*  CREATININE 1.01  --   --  0.89  TROPONINI  --  <0.30 <0.30  --     Estimated Creatinine Clearance: 99.2 ml/min (by C-G formula based on Cr of 0.89).  Assessment: 68 y/o M on heparin for afib. First HL is <0.1. Other labs as above. No issues per RN.    Goal of Therapy:  Heparin level 0.3-0.7 units/ml Monitor platelets by anticoagulation protocol: Yes   Plan:  -Heparin 2000 units BOLUS -Increase heparin drip to 1750 units/hr -0800 HL -Daily CBC/HL -Monitor for bleeding  Abran DukeLedford, Izaac 05/17/2014,1:00 AM   Addendum  -Heparin level is therapeutic this morning -Continue 1750 units/hr -Daily HL/CBC -Check confirmatory HL this afternoon -Monitor transition to PO anticoagulation    Agapito GamesAlison Nohealani Medinger, PharmD, BCPS Clinical Pharmacist Pager: [phone] 05/17/2014 8:23 AM

## 2014-05-17 NOTE — Progress Notes (Signed)
  Echocardiogram 2D Echocardiogram with Definity has been performed.  Cathie BeamsGREGORY, Caoimhe Damron 05/17/2014, 12:53 PM

## 2014-05-18 LAB — BASIC METABOLIC PANEL
Anion gap: 11 (ref 5–15)
BUN: 18 mg/dL (ref 6–23)
CO2: 27 meq/L (ref 19–32)
CREATININE: 1.12 mg/dL (ref 0.50–1.35)
Calcium: 8.9 mg/dL (ref 8.4–10.5)
Chloride: 104 mEq/L (ref 96–112)
GFR calc Af Amer: 77 mL/min — ABNORMAL LOW (ref >90)
GFR, EST NON AFRICAN AMERICAN: 66 mL/min — AB (ref >90)
GLUCOSE: 118 mg/dL — AB (ref 70–99)
Potassium: 4.3 mEq/L (ref 3.7–5.3)
Sodium: 142 mEq/L (ref 137–147)

## 2014-05-18 MED ORDER — ATORVASTATIN CALCIUM 20 MG PO TABS: 20.0000 mg | ORAL_TABLET | Freq: Every day | ORAL | Status: DC

## 2014-05-18 MED ORDER — METOPROLOL SUCCINATE ER 100 MG PO TB24
100.0000 mg | ORAL_TABLET | Freq: Every day | ORAL | Status: DC
  Administered 2014-05-18: 100 mg via ORAL
  Filled 2014-05-18: qty 1

## 2014-05-18 MED ORDER — FUROSEMIDE 40 MG PO TABS
40.0000 mg | ORAL_TABLET | Freq: Every day | ORAL | Status: DC
  Administered 2014-05-18: 40 mg via ORAL
  Filled 2014-05-18: qty 1

## 2014-05-18 MED ORDER — FUROSEMIDE 40 MG PO TABS: 40.0000 mg | ORAL_TABLET | Freq: Every day | ORAL | Status: DC

## 2014-05-18 MED ORDER — APIXABAN 5 MG PO TABS: 5.0000 mg | ORAL_TABLET | Freq: Two times a day (BID) | ORAL | Status: DC

## 2014-05-18 MED ORDER — DILTIAZEM HCL ER COATED BEADS 360 MG PO CP24
360.0000 mg | ORAL_CAPSULE | Freq: Every day | ORAL | Status: DC
  Filled 2014-05-18: qty 1

## 2014-05-18 MED ORDER — METOPROLOL SUCCINATE ER 100 MG PO TB24: 100.0000 mg | ORAL_TABLET | Freq: Every day | ORAL | Status: DC

## 2014-05-18 NOTE — Progress Notes (Signed)
All d/c instructions explained and given to pt.  Verbalized understanding.  D/c off flooor via ambulatory.  Decline w/c service.  Spouse escorted pt.

## 2014-05-18 NOTE — Progress Notes (Signed)
Patient Profile: 68 y/o male with a h/o CAD s/p CABG in 2003, HTN and HLD, admitted with new onset atrial flutter and acute diastolic HF  Subjective: Feels great. No complaints. Denies dyspnea, chest pain and palpitations. Eager to go home.   Objective: Vital signs in last 24 hours: Temp:  [97.4 F (36.3 C)-98.2 F (36.8 C)] 98.2 F (36.8 C) (10/15 0550) Pulse Rate:  [57-85] 57 (10/15 0550) Resp:  [19-27] 20 (10/15 0550) BP: (100-130)/(54-83) 127/83 mmHg (10/15 0550) SpO2:  [92 %-99 %] 99 % (10/15 0550) Weight:  [223 lb 8.7 oz (101.4 kg)] 223 lb 8.7 oz (101.4 kg) (10/14 2059) Last BM Date: 05/17/14  Intake/Output from previous day: 10/14 0701 - 10/15 0700 In: 877.5 [P.O.:860; I.V.:17.5] Out: 2700 [Urine:2700] Intake/Output this shift:    Medications Current Facility-Administered Medications  Medication Dose Route Frequency Provider Last Rate Last Dose  . 0.9 %  sodium chloride infusion  250 mL Intravenous PRN Rhonda G Barrett, PA-C      . acetaminophen (TYLENOL) tablet 650 mg  650 mg Oral Q4H PRN Rhonda G Barrett, PA-C      . ALPRAZolam Prudy Feeler(XANAX) tablet 0.25 mg  0.25 mg Oral BID PRN Joline SaltRhonda G Barrett, PA-C   0.25 mg at 05/17/14 0819  . apixaban (ELIQUIS) tablet 5 mg  5 mg Oral BID Donato SchultzMark Harveer Sadler, MD   5 mg at 05/17/14 2224  . aspirin EC tablet 81 mg  81 mg Oral Daily Joline SaltRhonda G Barrett, PA-C   81 mg at 05/17/14 0951  . atorvastatin (LIPITOR) tablet 20 mg  20 mg Oral q1800 Donato SchultzMark Darleen Moffitt, MD   20 mg at 05/17/14 1827  . diltiazem (CARDIZEM) 100 mg in dextrose 5 % 100 mL (1 mg/mL) infusion  5-20 mg/hr Intravenous Continuous Rhonda G Barrett, PA-C   20 mg/hr at 05/17/14 0900  . diltiazem (CARDIZEM) tablet 90 mg  90 mg Oral QID Donato SchultzMark Dominga Mcduffie, MD   90 mg at 05/17/14 2224  . furosemide (LASIX) injection 40 mg  40 mg Intravenous BID Joline Salthonda G Barrett, PA-C   40 mg at 05/17/14 1827  . nitroGLYCERIN (NITROSTAT) SL tablet 0.4 mg  0.4 mg Sublingual Q5 Min x 3 PRN Rhonda G Barrett, PA-C      .  ondansetron (ZOFRAN) injection 4 mg  4 mg Intravenous Q6H PRN Rhonda G Barrett, PA-C      . sodium chloride 0.9 % injection 3 mL  3 mL Intravenous Q12H Rhonda G Barrett, PA-C   3 mL at 05/17/14 2225  . sodium chloride 0.9 % injection 3 mL  3 mL Intravenous PRN Rhonda G Barrett, PA-C      . zolpidem (AMBIEN) tablet 5 mg  5 mg Oral QHS PRN Darrol Jumphonda G Barrett, PA-C        PE: General appearance: alert, cooperative and no distress Lungs: clear to auscultation bilaterally Heart: fast rate irregular rhythm Extremities: no LEE Pulses: 2+ and symmetric Skin: warm and dry Neurologic: Grossly normal  Lab Results:   Recent Labs  05/16/14 1212 05/17/14 0002  WBC 12.6* 11.6*  HGB 14.0 13.4  HCT 44.1 41.2  PLT 341 303   BMET  Recent Labs  05/16/14 1212 05/17/14 0002  NA 140 137  K 4.5 3.8  CL 105 101  CO2 24 23  GLUCOSE 117* 124*  BUN 18 18  CREATININE 1.01 0.89  CALCIUM 8.8 8.5   PT/INR  Recent Labs  05/16/14 1547  LABPROT 14.4  INR 1.12   Cholesterol  Recent Labs  05/16/14 1548  CHOL 135   Studies/Results: 2D echocardiogram Study Conclusions  - Left ventricle: The cavity size was normal. There was mild concentric hypertrophy. Systolic function was moderately to severely reduced. The estimated ejection fraction was in the range of 30% to 35%. Diffuse hypokinesis. There is akinesis of the basalinferior myocardium. There is severe hypokinesis of the mid-apicalinferior myocardium. - Aortic valve: Moderate thickening and calcification, consistent with sclerosis. - Mitral valve: There was mild regurgitation.   Assessment/Plan  Principal Problem:   Acute congestive heart failure Active Problems:   Atrial flutter with rapid ventricular response   Hypertension goal BP (blood pressure) < 130/80   Atrial fibrillation, rapid  1. Atrial Flutter:  Still in flutter. Resting rate controlled in the 90s. Asymptomatic. BP stable. Given cardiomyopathy with EF of 30-35%,  will discontinue PO Diltiazem and switch to PO Metoprolol XL-100 mg daily. Will reassess ventricular rate at noon. If resting rate is > 100 will increase to 200 mg daily for better rate control. Continue Eliquis for [facility]. Repeat OP EKG in 3-4 weeks and plan for DCCV if still in flutter/fib.   2. Acute Systolic HF: EF 30-35% on 2D echo with diffuse hypokinesis. ? If tachy mediated. He denies anginal symptoms. Euvolemic on physical exam. Asymptomatic. Will place on BB. Continue diuretic. Will change lasix from IV to PO, 40 mg daily. Will need to consider ACE-I at time of f/u. Will hold off for now to avoid hypotension, given his higher dose of BB.   3. HTN: well controlled in the 120s systolic and 80s diastolic  4. HLD: LDL very close to goal at 71 mg/dL. Continue Lipitor.   5. CAD: h/o CABG. Denies anginal symptoms. On BB and Statin.     LOS: 2 days    Brittainy M. Delmer IslamSimmons, PA-C 05/18/2014 7:54 AM  Personally seen and examined. Agree with above. Change dilt to toprol 100. If HR still well rate controlled, OK for DC this afternoon.  Close follow up with me next week.  Donato SchultzSKAINS, Derian Dimalanta, MD

## 2014-05-18 NOTE — Discharge Summary (Signed)
Physician Discharge Summary  Patient ID: Stanley FreibergJames C Verville MRN: [id number] DOB/AGE: 67/04/1946 67 y.o.  Admit date: 05/16/2014 Discharge date: 05/18/2014  Primary Cardiologist: Dr. Anne FuSkains  Admission Diagnoses: Acute CHF   Discharge Diagnoses:  Principal Problem:   Acute congestive heart failure Active Problems:   Atrial flutter with rapid ventricular response   Hypertension goal BP (blood pressure) < 130/80   Atrial fibrillation, rapid   Discharged Condition: stable  Hospital Course: 68 y/o male with history of CAD s/p CABG in 2003. He has not been followed since that time. [facility] with complaints of dyspnea, orthopnea and edema/weight gain. He was initially seen by his PCP  And an EKG demonstrated new atrial flutter with RVR. Physical exam was also notable for significant volume overload. Subsequently, he was sent to the ER for evaluation.   On arrival, he was also hypertensive. BNP was elevated at 2265. CXR showed mild pulmonary edema. CT was negative for PE. Cardiac enzymes were negative x 3. TSH was normal as were electrolytes. He  received IV Lasix with good urine output and symptoms improved. He was started on a nitroglycerin drip, with improvement in his blood pressure (nomalized). IV Cardizem was initiated and rate improved however he continued in atrial flutter. He was transitioned to 360 mg of Diltiazem. Eliquis was started for anticoagulation. A 2 D echo was obtained revealing severely reduced systolic function with an EF of 30-35%. He denied chest pain.  It was felt that his cardiomyopathy was likely tachy mediated. His Cardizem was discontinued and he was started on 100 mg of Toprolol -XL. Ventricular rate remained well controlled. The plan is for repeat EKG in 3-4 weeks and to cardiovert if still in atrial flutter. He was not started on an ACE/ARB to avoid hypotension given his high dose of BB. His BP will need to be re-evaluated and an ACE should be  initiated if stable.   He had no further issues, he was last seen and examined by Dr. Anne FuSkains, who determined he was stable for discharge home.   Consults: None  Significant Diagnostic Studies:   2D echo 05/17/14 Study Conclusions  - Left ventricle: The cavity size was normal. There was mild concentric hypertrophy. Systolic function was moderately to severely reduced. The estimated ejection fraction was in the range of 30% to 35%. Diffuse hypokinesis. There is akinesis of the basalinferior myocardium. There is severe hypokinesis of the mid-apicalinferior myocardium. - Aortic valve: Moderate thickening and calcification, consistent with sclerosis. - Mitral valve: There was mild regurgitation.   Treatments: See Hospital Course  Discharge Exam: Blood pressure 128/79, pulse 99, temperature 98.2 F (36.8 C), temperature source Oral, resp. rate 20, height 5\' 11"  (1.803 m), weight 223 lb 8.7 oz (101.4 kg), SpO2 99.00%.   Disposition: 01-Home or Self CareHome      Discharge Instructions   Diet - low sodium heart healthy    Complete by:  As directed      Increase activity slowly    Complete by:  As directed             Medication List    STOP taking these medications       aspirin EC 81 MG tablet      TAKE these medications       apixaban 5 MG Tabs tablet  Commonly known as:  ELIQUIS  Take 1 tablet (5 mg total) by mouth 2 (two) times daily.     atorvastatin 20 MG tablet  Commonly known as:  LIPITOR  Take 1 tablet (20 mg total) by mouth daily at 6 PM.     furosemide 40 MG tablet  Commonly known as:  LASIX  Take 1 tablet (40 mg total) by mouth daily.     metoprolol succinate 100 MG 24 hr tablet  Commonly known as:  TOPROL-XL  Take 1 tablet (100 mg total) by mouth daily.       Follow-up Information   Follow up with Jacolyn ReedyMichele Lenze, PA-C On 05/31/2014. (1:15 PM (cardiology-up))    Specialty:  Cardiology   Contact information:   [address] [place] [address] [phone]     TIME SPENT ON DISCHARGE INCLUDING PHYSICIAN TIME: >30 MINUTES  Signed: Robbie LisSIMMONS, BRITTAINY 05/18/2014, 4:18 PM

## 2014-05-19 NOTE — Discharge Summary (Signed)
Personally seen and examined. Agree with above. Toprol (EF 35%) Eliquis x 3 weeks Cardioversion then if still in flutter ?partly tachy induced cardiomyopathy Donato SchultzSKAINS, Danasia Baker, MD

## 2014-05-21 NOTE — ED Provider Notes (Signed)
Medical screening examination/treatment/procedure(s) were conducted as a shared visit with non-physician practitioner(s) and myself.  I personally evaluated the patient during the encounter.   EKG Interpretation   Date/Time:  Tuesday May 16 2014 12:58:57 EDT Ventricular Rate:  138 PR Interval:  60 QRS Duration: 98 QT Interval:  297 QTC Calculation: 450 R Axis:   95 Text Interpretation:  Sinus or ectopic atrial tachycardia Ventricular  premature complex Probable anterolateral infarct, age indeterm ED  PHYSICIAN INTERPRETATION AVAILABLE IN [facility] Confirmed by TEST,  Record ([id number]) on 05/18/2014 7:04:35 AM       Raeford RazorStephen Wallice Granville, MD 05/21/14 1620

## 2014-05-29 ENCOUNTER — Emergency Department (HOSPITAL_COMMUNITY): Payer: Medicare Other

## 2014-05-29 ENCOUNTER — Inpatient Hospital Stay (HOSPITAL_COMMUNITY)
Admission: EM | Admit: 2014-05-29 | Discharge: 2014-06-02 | DRG: 444 | Disposition: A | Discharge status: Home / Self Care | Payer: Medicare Other | Attending: Cardiovascular Disease | Admitting: Cardiovascular Disease

## 2014-05-29 ENCOUNTER — Encounter (HOSPITAL_COMMUNITY): Payer: Self-pay | Admitting: Emergency Medicine

## 2014-05-29 DIAGNOSIS — I10 Essential (primary) hypertension: Secondary | ICD-10-CM | POA: Diagnosis present

## 2014-05-29 DIAGNOSIS — K810 Acute cholecystitis: Secondary | ICD-10-CM | POA: Diagnosis not present

## 2014-05-29 DIAGNOSIS — I429 Cardiomyopathy, unspecified: Secondary | ICD-10-CM | POA: Diagnosis present

## 2014-05-29 DIAGNOSIS — I252 Old myocardial infarction: Secondary | ICD-10-CM | POA: Diagnosis not present

## 2014-05-29 DIAGNOSIS — K829 Disease of gallbladder, unspecified: Secondary | ICD-10-CM

## 2014-05-29 DIAGNOSIS — E279 Disorder of adrenal gland, unspecified: Secondary | ICD-10-CM | POA: Diagnosis not present

## 2014-05-29 DIAGNOSIS — R100 Acute abdomen: Secondary | ICD-10-CM | POA: Diagnosis not present

## 2014-05-29 DIAGNOSIS — I5042 Chronic combined systolic (congestive) and diastolic (congestive) heart failure: Secondary | ICD-10-CM | POA: Diagnosis present

## 2014-05-29 DIAGNOSIS — I25810 Atherosclerosis of coronary artery bypass graft(s) without angina pectoris: Secondary | ICD-10-CM

## 2014-05-29 DIAGNOSIS — Z7901 Long term (current) use of anticoagulants: Secondary | ICD-10-CM

## 2014-05-29 DIAGNOSIS — Z87891 Personal history of nicotine dependence: Secondary | ICD-10-CM

## 2014-05-29 DIAGNOSIS — R0602 Shortness of breath: Secondary | ICD-10-CM | POA: Diagnosis not present

## 2014-05-29 DIAGNOSIS — R000 Tachycardia, unspecified: Secondary | ICD-10-CM | POA: Diagnosis present

## 2014-05-29 DIAGNOSIS — I341 Nonrheumatic mitral (valve) prolapse: Secondary | ICD-10-CM | POA: Diagnosis not present

## 2014-05-29 DIAGNOSIS — I5041 Acute combined systolic (congestive) and diastolic (congestive) heart failure: Secondary | ICD-10-CM | POA: Diagnosis present

## 2014-05-29 DIAGNOSIS — I2510 Atherosclerotic heart disease of native coronary artery without angina pectoris: Secondary | ICD-10-CM | POA: Diagnosis present

## 2014-05-29 DIAGNOSIS — I340 Nonrheumatic mitral (valve) insufficiency: Secondary | ICD-10-CM | POA: Diagnosis not present

## 2014-05-29 DIAGNOSIS — Z951 Presence of aortocoronary bypass graft: Secondary | ICD-10-CM

## 2014-05-29 DIAGNOSIS — I4891 Unspecified atrial fibrillation: Secondary | ICD-10-CM

## 2014-05-29 DIAGNOSIS — I483 Typical atrial flutter: Secondary | ICD-10-CM | POA: Diagnosis not present

## 2014-05-29 DIAGNOSIS — I4892 Unspecified atrial flutter: Secondary | ICD-10-CM | POA: Diagnosis present

## 2014-05-29 DIAGNOSIS — R1033 Periumbilical pain: Secondary | ICD-10-CM | POA: Diagnosis not present

## 2014-05-29 DIAGNOSIS — R1011 Right upper quadrant pain: Secondary | ICD-10-CM | POA: Diagnosis not present

## 2014-05-29 DIAGNOSIS — R1010 Upper abdominal pain, unspecified: Secondary | ICD-10-CM | POA: Diagnosis not present

## 2014-05-29 DIAGNOSIS — K819 Cholecystitis, unspecified: Secondary | ICD-10-CM | POA: Diagnosis present

## 2014-05-29 DIAGNOSIS — R1909 Other intra-abdominal and pelvic swelling, mass and lump: Secondary | ICD-10-CM | POA: Diagnosis not present

## 2014-05-29 DIAGNOSIS — R109 Unspecified abdominal pain: Secondary | ICD-10-CM

## 2014-05-29 HISTORY — DX: Cholecystitis, unspecified: K81.9

## 2014-05-29 HISTORY — DX: Atherosclerotic heart disease of native coronary artery without angina pectoris: I25.10

## 2014-05-29 HISTORY — DX: Chronic systolic (congestive) heart failure: I50.22

## 2014-05-29 LAB — I-STAT CHEM 8, ED
BUN: 32 mg/dL — AB (ref 6–23)
CALCIUM ION: 1.15 mmol/L (ref 1.13–1.30)
CHLORIDE: 103 meq/L (ref 96–112)
CREATININE: 1.2 mg/dL (ref 0.50–1.35)
Glucose, Bld: 141 mg/dL — ABNORMAL HIGH (ref 70–99)
HCT: 50 % (ref 39.0–52.0)
Hemoglobin: 17 g/dL (ref 13.0–17.0)
Potassium: 4.5 mEq/L (ref 3.7–5.3)
Sodium: 139 mEq/L (ref 137–147)
TCO2: 22 mmol/L (ref 0–100)

## 2014-05-29 LAB — URINALYSIS, ROUTINE W REFLEX MICROSCOPIC
Bilirubin Urine: NEGATIVE
Glucose, UA: NEGATIVE mg/dL
HGB URINE DIPSTICK: NEGATIVE
Ketones, ur: NEGATIVE mg/dL
LEUKOCYTES UA: NEGATIVE
NITRITE: NEGATIVE
PROTEIN: 30 mg/dL — AB
SPECIFIC GRAVITY, URINE: 1.041 — AB (ref 1.005–1.030)
UROBILINOGEN UA: 0.2 mg/dL (ref 0.0–1.0)
pH: 5 (ref 5.0–8.0)

## 2014-05-29 LAB — HEPATIC FUNCTION PANEL
ALT: 79 U/L — ABNORMAL HIGH (ref 0–53)
AST: 33 U/L (ref 0–37)
Albumin: 3.3 g/dL — ABNORMAL LOW (ref 3.5–5.2)
Alkaline Phosphatase: 59 U/L (ref 39–117)
BILIRUBIN INDIRECT: 0.6 mg/dL (ref 0.3–0.9)
BILIRUBIN TOTAL: 0.8 mg/dL (ref 0.3–1.2)
Bilirubin, Direct: 0.2 mg/dL (ref 0.0–0.3)
Total Protein: 5.7 g/dL — ABNORMAL LOW (ref 6.0–8.3)

## 2014-05-29 LAB — URINE MICROSCOPIC-ADD ON

## 2014-05-29 LAB — CBC
HEMATOCRIT: 47.1 % (ref 39.0–52.0)
Hemoglobin: 15 g/dL (ref 13.0–17.0)
MCH: 29.2 pg (ref 26.0–34.0)
MCHC: 31.8 g/dL (ref 30.0–36.0)
MCV: 91.8 fL (ref 78.0–100.0)
Platelets: 274 10*3/uL (ref 150–400)
RBC: 5.13 MIL/uL (ref 4.22–5.81)
RDW: 14.9 % (ref 11.5–15.5)
WBC: 14.8 10*3/uL — AB (ref 4.0–10.5)

## 2014-05-29 LAB — I-STAT TROPONIN, ED: Troponin i, poc: 0.01 ng/mL (ref 0.00–0.08)

## 2014-05-29 LAB — LIPASE, BLOOD: LIPASE: 24 U/L (ref 11–59)

## 2014-05-29 LAB — PRO B NATRIURETIC PEPTIDE: Pro B Natriuretic peptide (BNP): 3333 pg/mL — ABNORMAL HIGH (ref 0–125)

## 2014-05-29 MED ORDER — DILTIAZEM HCL 25 MG/5ML IV SOLN
10.0000 mg | Freq: Once | INTRAVENOUS | Status: AC
  Administered 2014-05-29: 10 mg via INTRAVENOUS
  Filled 2014-05-29: qty 5

## 2014-05-29 MED ORDER — FUROSEMIDE 10 MG/ML IJ SOLN
40.0000 mg | Freq: Two times a day (BID) | INTRAMUSCULAR | Status: DC
  Administered 2014-05-30 – 2014-05-31 (×3): 40 mg via INTRAVENOUS
  Filled 2014-05-29 (×5): qty 4

## 2014-05-29 MED ORDER — SODIUM CHLORIDE 0.9 % IV SOLN
INTRAVENOUS | Status: DC
  Administered 2014-05-30: via INTRAVENOUS

## 2014-05-29 MED ORDER — ASPIRIN EC 81 MG PO TBEC
81.0000 mg | DELAYED_RELEASE_TABLET | Freq: Every day | ORAL | Status: DC
  Administered 2014-05-30 – 2014-06-01 (×3): 81 mg via ORAL
  Filled 2014-05-29 (×4): qty 1

## 2014-05-29 MED ORDER — LISINOPRIL 5 MG PO TABS
5.0000 mg | ORAL_TABLET | Freq: Every day | ORAL | Status: DC
  Administered 2014-05-30 – 2014-06-02 (×4): 5 mg via ORAL
  Filled 2014-05-29 (×4): qty 1

## 2014-05-29 MED ORDER — METOPROLOL SUCCINATE ER 100 MG PO TB24
100.0000 mg | ORAL_TABLET | Freq: Every day | ORAL | Status: DC
  Administered 2014-05-30 – 2014-06-02 (×4): 100 mg via ORAL
  Filled 2014-05-29 (×4): qty 1

## 2014-05-29 MED ORDER — DILTIAZEM HCL 100 MG IV SOLR
5.0000 mg/h | INTRAVENOUS | Status: DC
  Administered 2014-05-29: 5 mg/h via INTRAVENOUS
  Administered 2014-05-30 – 2014-05-31 (×3): 10 mg/h via INTRAVENOUS
  Filled 2014-05-29 (×6): qty 100

## 2014-05-29 MED ORDER — CIPROFLOXACIN IN D5W 400 MG/200ML IV SOLN
400.0000 mg | Freq: Two times a day (BID) | INTRAVENOUS | Status: DC
  Administered 2014-05-30 (×3): 400 mg via INTRAVENOUS
  Filled 2014-05-29 (×4): qty 200

## 2014-05-29 MED ORDER — APIXABAN 5 MG PO TABS
5.0000 mg | ORAL_TABLET | Freq: Two times a day (BID) | ORAL | Status: DC
  Administered 2014-05-30: 5 mg via ORAL
  Filled 2014-05-29 (×3): qty 1

## 2014-05-29 MED ORDER — IOHEXOL 300 MG/ML  SOLN
80.0000 mL | Freq: Once | INTRAMUSCULAR | Status: AC | PRN
  Administered 2014-05-29: 80 mL via INTRAVENOUS

## 2014-05-29 MED ORDER — ATORVASTATIN CALCIUM 20 MG PO TABS
20.0000 mg | ORAL_TABLET | Freq: Every day | ORAL | Status: DC
  Administered 2014-05-30 – 2014-06-01 (×3): 20 mg via ORAL
  Filled 2014-05-29 (×4): qty 1

## 2014-05-29 MED ORDER — IOHEXOL 300 MG/ML  SOLN
25.0000 mL | INTRAMUSCULAR | Status: AC
  Administered 2014-05-29: 25 mL via ORAL

## 2014-05-29 MED ORDER — FUROSEMIDE 10 MG/ML IJ SOLN
40.0000 mg | Freq: Once | INTRAMUSCULAR | Status: AC
  Administered 2014-05-29: 40 mg via INTRAVENOUS
  Filled 2014-05-29: qty 4

## 2014-05-29 NOTE — ED Provider Notes (Signed)
CSN: [id number]     Arrival date & time 05/29/14  [id number] History   First MD Initiated Contact with Patient 05/29/14 [phone]     Chief Complaint  Patient presents with  . Shortness of Breath     (Consider location/radiation/quality/duration/timing/severity/associated sxs/prior Treatment) HPI Comments: Patient presents with shortness of breath and abdominal pain. He has a history of CHF and was recently admitted to [facility] for CHF exacerbation. He was discharged about a week ago. He states since she's been home he does have some shortness of breath is consistent with his past CHF. He says it's fairly baseline currently. The main reason he came back today he's complaining of abdominal pain. His abdomen has been intermittently hurting for the last 3 months. He states that he notify the physicians during this last hospitalization but the focus was mostly on his cardiac problems. He feels like his abdominal pain is getting worse. He has pain around his umbilicus it's worse when he ambulates and with certain positions. He states when his abdomen is hurting feels distended and he feels like aspirin making his breathing worse. He denies any fevers or chills. He's had some nausea but no vomiting. He is having normal bowel movements.  Patient is a 68 y.o. male presenting with shortness of breath.  Shortness of Breath Associated symptoms: abdominal pain   Associated symptoms: no chest pain, no cough, no diaphoresis, no fever, no headaches, no rash and no vomiting     Past Medical History  Diagnosis Date  . MI (myocardial infarction)     silent MI (?x3) prior to CABG  . CHF (congestive heart failure)    Past Surgical History  Procedure Laterality Date  . Coronary artery bypass graft  2003    x 5   Family History  Problem Relation Age of Onset  . Cancer Mother   . Cancer Father    History  Substance Use Topics  . Smoking status: Former Smoker -- 1.00 packs/day for 40 years    Types: Cigarettes     Quit date: 02/02/2002  . Smokeless tobacco: Never Used  . Alcohol Use: Yes     Comment: 6 pack of beer a week    Review of Systems  Constitutional: Negative for fever, chills, diaphoresis and fatigue.  HENT: Negative for congestion, rhinorrhea and sneezing.   Eyes: Negative.   Respiratory: Positive for shortness of breath. Negative for cough and chest tightness.   Cardiovascular: Negative for chest pain and leg swelling.  Gastrointestinal: Positive for nausea and abdominal pain. Negative for vomiting and blood in stool.  Genitourinary: Negative for frequency, hematuria, flank pain and difficulty urinating.  Musculoskeletal: Negative for arthralgias and back pain.  Skin: Negative for rash.  Neurological: Negative for dizziness, speech difficulty, weakness, numbness and headaches.      Allergies  Review of patient's allergies indicates no known allergies.  Home Medications   Prior to Admission medications   Medication Sig Start Date End Date Taking? Authorizing Provider  apixaban (ELIQUIS) 5 MG TABS tablet Take 1 tablet (5 mg total) by mouth 2 (two) times daily. 05/18/14  Yes Brittainy Sherlynn CarbonM Simmons, PA-C  aspirin EC 81 MG tablet Take 81 mg by mouth daily.   Yes Historical Provider, MD  atorvastatin (LIPITOR) 20 MG tablet Take 1 tablet (20 mg total) by mouth daily at 6 PM. 05/18/14  Yes Brittainy Sherlynn CarbonM Simmons, PA-C  furosemide (LASIX) 40 MG tablet Take 1 tablet (40 mg total) by mouth daily. 05/18/14  Yes  Brittainy Sherlynn CarbonM Simmons, PA-C  metoprolol succinate (TOPROL-XL) 100 MG 24 hr tablet Take 1 tablet (100 mg total) by mouth daily. 05/18/14  Yes Brittainy Sherlynn CarbonM Simmons, PA-C  Multiple Vitamin (MULTIVITAMIN WITH MINERALS) TABS tablet Take 1 tablet by mouth daily.   Yes Historical Provider, MD   BP 118/88  Pulse 135  Temp(Src) 97.6 F (36.4 C) (Oral)  Resp 22  SpO2 99% Physical Exam  Constitutional: He is oriented to person, place, and time. He appears well-developed and well-nourished.   HENT:  Head: Normocephalic and atraumatic.  Eyes: Pupils are equal, round, and reactive to light.  Neck: Normal range of motion. Neck supple.  Cardiovascular: Normal rate, regular rhythm and normal heart sounds.   Pulmonary/Chest: Effort normal and breath sounds normal. No respiratory distress. He has no wheezes. He has no rales. He exhibits no tenderness.  Abdominal: Soft. Bowel sounds are normal. There is tenderness (mild TTP around umbilicus). There is no rebound and no guarding.  Musculoskeletal: Normal range of motion. He exhibits no edema.  Lymphadenopathy:    He has no cervical adenopathy.  Neurological: He is alert and oriented to person, place, and time.  Skin: Skin is warm and dry. No rash noted.  Psychiatric: He has a normal mood and affect.    ED Course  Procedures (including critical care time) Labs Review Labs Reviewed  CBC - Abnormal; Notable for the following:    WBC 14.8 (*)    All other components within normal limits  PRO B NATRIURETIC PEPTIDE - Abnormal; Notable for the following:    Pro B Natriuretic peptide (BNP) 3333.0 (*)    All other components within normal limits  HEPATIC FUNCTION PANEL - Abnormal; Notable for the following:    Total Protein 5.7 (*)    Albumin 3.3 (*)    ALT 79 (*)    All other components within normal limits  URINALYSIS, ROUTINE W REFLEX MICROSCOPIC - Abnormal; Notable for the following:    Specific Gravity, Urine 1.041 (*)    Protein, ur 30 (*)    All other components within normal limits  URINE MICROSCOPIC-ADD ON - Abnormal; Notable for the following:    Squamous Epithelial / LPF FEW (*)    Bacteria, UA FEW (*)    Casts HYALINE CASTS (*)    All other components within normal limits  I-STAT CHEM 8, ED - Abnormal; Notable for the following:    BUN 32 (*)    Glucose, Bld 141 (*)    All other components within normal limits  LIPASE, BLOOD  I-STAT TROPOININ, ED    Imaging Review Ct Abdomen Pelvis W Contrast  05/29/2014    CLINICAL DATA:  Left lower quadrant abdominal pain and swelling for 3 days.  EXAM: CT ABDOMEN AND PELVIS WITH CONTRAST  TECHNIQUE: Multidetector CT imaging of the abdomen and pelvis was performed using the standard protocol following bolus administration of intravenous contrast.  CONTRAST:  80mL OMNIPAQUE IOHEXOL 300 MG/ML  SOLN  COMPARISON:  Chest CT 05/16/2014  FINDINGS: Lower chest: There are small bilateral pleural effusions with overlying atelectasis. No pericardial effusion. Three-vessel coronary artery calcifications are noted. The distal esophagus is grossly normal.  Hepatobiliary: Benign right hepatic lobe cyst. No worrisome hepatic lesions or intrahepatic biliary dilatation. A calcified granuloma is noted in the lower aspect of the right hepatic lobe. The gallbladder wall may be slightly thickened. No obvious calcified gallstones or pericholecystic inflammatory change. Right upper quadrant ultrasound may be helpful for further evaluation. No common  bile duct dilatation.  Pancreas: No focal hepatic lesions or evidence for acute pancreatitis.  Spleen: Normal size.  No focal lesions.  Adrenals/Urinary Tract: Left adrenal gland lesions are noted. These are likely benign adenomas. The right adrenal gland is normal. Both kidneys are unremarkable. No worrisome renal lesions or hydronephrosis. No obstructing ureteral calculi. The bladder appears normal.  Stomach/Bowel: The stomach, duodenum, small bowel and colon are unremarkable. No inflammatory changes, mass lesions or obstructive findings.  Vascular/Lymphatic: The aorta demonstrates Moderate to advanced atherosclerotic calcifications. No focal aneurysm or dissection. The branch vessels are patent. The major venous structures are patent.  Other: There is interstitial change am fluid in the retroperitoneum surrounding the kidneys and extending down into the extraperitoneal pelvis. I do not see an obvious explanation for these findings. It could be the sequela of  a cleared urinary tract infection and or pyelonephritis. I do not see any obstructing ureteral calculi or hydronephrosis.  Pelvis: The bladder, prostate glands and seminal vesicles are unremarkable. No pelvic mass or adenopathy. Extraperitoneal pelvic fluid is noted. No inguinal mass or adenopathy.  Musculoskeletal: No significant findings. Bilateral pars defects are noted at L5.  IMPRESSION: 1. Retroperitoneal interstitial change/fluid/edema of uncertain etiology. I do not see any definite findings for acute pyelonephritis or obstructive uropathy, pancreatitis or aortic abnormality. Retroperitoneal peritonitis would be a consideration. No abdominal/pelvic mass or adenopathy. 2. Left adrenal gland lesions, likely benign adenomas. 3. Moderate to advanced atherosclerotic changes involving the aorta and branch vessels. 4. Possible inflammation of the gallbladder with suspected wall thickening. Right upper quadrant ultrasound may be helpful for further evaluation.   Electronically Signed   By: Loralie Champagne M.D.   On: 05/29/2014 13:39   Dg Chest Port 1 View  05/29/2014   CLINICAL DATA:  CHF, recently discharged from hospital, now with shortness of breath, abdominal swelling for 3 days, personal history of coronary artery disease post MI and CABG, former smoker  EXAM: PORTABLE CHEST - 1 VIEW  COMPARISON:  Portable exam 1013 hr compared to 05/16/2014  FINDINGS: Enlargement of cardiac silhouette post CABG.  Mediastinal contours and pulmonary vascularity normal.  Mild interstitial changes at the inferior lungs bilaterally could reflect minimal residual edema.  Improved pulmonary edema and decreased pleural effusions versus previous exam.  No segmental consolidation or pneumothorax.  Osseous structures unremarkable.  IMPRESSION: Improved CHF.   Electronically Signed   By: Ulyses Southward M.D.   On: 05/29/2014 10:24   US Abdomen Limited Ruq  05/29/2014   CLINICAL DATA:  Mid abdominal pain, initial evaluation  EXAM: US  ABDOMEN LIMITED - RIGHT UPPER QUADRANT  COMPARISON:  05/29/2014 CT scan  FINDINGS: Gallbladder:  Gallbladder wall is 9 mm in thickness. There is sludge in the gallbladder. There is at least 1 tiny calculus. There is a positive sonographic Murphy's sign.  Common bile duct:  Diameter: 3 mm  Liver:  15 mm cyst in the right lobe, otherwise negative  Incidental note of a small right pleural effusion  IMPRESSION: Findings concerning for acute cholecystitis   Electronically Signed   By: Esperanza Heir M.D.   On: 05/29/2014 16:21     EKG Interpretation   Date/Time:  Monday May 29 2014 16:35:41 EDT Ventricular Rate:  136 PR Interval:  72 QRS Duration: 113 QT Interval:  320 QTC Calculation: 481 R Axis:   95 Text Interpretation:  Sinus tachycardia Ventricular premature complex  Probable anteroseptal infarct, old Nonspecific repol abnormality, diffuse  leads hard to tell if this  is sinus tach vs rapid a-flutter. Confirmed by  Jibri Schriefer  MD, Manali Mcelmurry ([id number]) on 05/29/2014 4:51:43 PM      MDM   Final diagnoses:  Abdominal pain    Pt with vague abd discomfort.  CT shows questionable gallbladder wall thickening, obtained u/s showing signs of acute cholecystitis.  WBC elevated, ALT mildly elevated, lipase normal.  Pt does have +Murphys sign.  Confounding factor is that pt is tachycardic.  I feel this rhythm represents a-flutter vs sinus tach, but difficult to tell on EKG.  Have tried cardizem with little improvement.  Also of note, pt is on Eliquis.  17:33, spoke with Dr. Donell Beers who will see pt tonight, requests medicine or cards to admit.  Still awaiting cards to call back, Dr. Preston Fleeting to talk with them.  Rolan Bucco, MD 05/31/14 [phone]

## 2014-05-29 NOTE — H&P (Signed)
Chief Complaint:  Shortness of breath at rest/orthopnea, abdominal discomfort  Cardiologist: Tresa EndoKelly  HPI:  This is a 68 y.o. male with a past medical history significant for recently diagnosed atrial flutter with rapid ventricular response and new onset cardiomyopathy (presumed tachycardia related) as well as previous coronary artery disease status post bypass surgery in 2003. He was recently hospitalized for the arrhythmia and discharged home with a plan for outpatient cardioversion after 3-4 weeks of anticoagulation.  He presents today with complaints of dyspnea at rest. He is sleeping in a recliner. He becomes very short of breath when he bends over. He has noticed upper abdominal discomfort, again worsened by bending over. He has poor appetite and early satiety. He has abdominal distention that began several weeks ago and was initially diagnosed with heart failure.  She also complains of abdominal discomfort status periumbilical and positional. Mostly he complains of a feeling of distention. He has not had fever, chills, vomiting or change in bowel pattern. He has occasional mild nausea.  He presents with tachycardia (atrial flutter with 2-1 AV block and a ventricular rate of 137 bpm). Electrocardiogram shows atrial flutter again. Cardiac enzymes are normal.  Evaluation in the emergency room showed evidence of mildly abnormal transaminases, elevated WBC, normal alkaline phosphatase and bilirubin levels, possible mild thickening of the gallbladder wall on CT but without pericholecystic inflammation or other signs of acute cholecystitis. No gallstones were seen. On the ultrasound the gallbladder wall is 9 mm in thickness with sludge and at least one tiny calculus. The common bile duct measured only 3 mm in diameter.   PMHx:  Past Medical History  Diagnosis Date  . MI (myocardial infarction)     silent MI (?x3) prior to CABG  . CHF (congestive heart failure)     Past Surgical History    Procedure Laterality Date  . Coronary artery bypass graft  2003    x 5    FAMHx:  Family History  Problem Relation Age of Onset  . Cancer Mother   . Cancer Father     SOCHx:   reports that he quit smoking about 12 years ago. His smoking use included Cigarettes. He has a 40 pack-year smoking history. He has never used smokeless tobacco. He reports that he drinks alcohol. He reports that he does not use illicit drugs.  ALLERGIES:  No Known Allergies  ROS: Constitutional: positive for anorexia, negative for chills, fevers and weight loss Ears, nose, mouth, throat, and face: negative for epistaxis, hoarseness, sore throat and voice change Respiratory: positive for dyspnea on exertion, negative for cough, hemoptysis, sputum, stridor and wheezing Cardiovascular: positive for dyspnea, orthopnea and paroxysmal nocturnal dyspnea, negative for chest pressure/discomfort, lower extremity edema, palpitations and syncope Gastrointestinal: positive for abdominal pain and nausea, negative for constipation, diarrhea, dysphagia, melena, odynophagia and vomiting Genitourinary:negative for frequency, hematuria, hesitancy and nocturia Hematologic/lymphatic: negative for bleeding, easy bruising, lymphadenopathy and petechiae Musculoskeletal:negative Neurological: negative Behavioral/Psych: negative Endocrine: negative for diabetic symptoms including polydipsia, polyphagia, polyuria and weight loss and temperature intolerance Allergic/Immunologic: negative  HOME MEDS:  (Not in a hospital admission) Scheduled Meds:  Continuous Infusions: . diltiazem (CARDIZEM) infusion 5 mg/hr (05/29/14 1747)   No current facility-administered medications on file prior to encounter.   Current Outpatient Prescriptions on File Prior to Encounter  Medication Sig Dispense Refill  . apixaban (ELIQUIS) 5 MG TABS tablet Take 1 tablet (5 mg total) by mouth 2 (two) times daily.  60 tablet  11  . atorvastatin (LIPITOR)  20 MG tablet Take 1 tablet (20 mg total) by mouth daily at 6 PM.  30 tablet  5  . furosemide (LASIX) 40 MG tablet Take 1 tablet (40 mg total) by mouth daily.  30 tablet  5  . metoprolol succinate (TOPROL-XL) 100 MG 24 hr tablet Take 1 tablet (100 mg total) by mouth daily.  30 tablet  5   LABS/IMAGING: Results for orders placed during the hospital encounter of 05/29/14 (from the past 48 hour(s))  CBC     Status: Abnormal   Collection Time    05/29/14 10:07 AM      Result Value Ref Range   WBC 14.8 (*) 4.0 - 10.5 K/uL   RBC 5.13  4.22 - 5.81 MIL/uL   Hemoglobin 15.0  13.0 - 17.0 g/dL   HCT 16.1  09.6 - 04.5 %   MCV 91.8  78.0 - 100.0 fL   MCH 29.2  26.0 - 34.0 pg   MCHC 31.8  30.0 - 36.0 g/dL   RDW 40.9  81.1 - 91.4 %   Platelets 274  150 - 400 K/uL  PRO B NATRIURETIC PEPTIDE     Status: Abnormal   Collection Time    05/29/14 10:07 AM      Result Value Ref Range   Pro B Natriuretic peptide (BNP) 3333.0 (*) 0 - 125 pg/mL  LIPASE, BLOOD     Status: None   Collection Time    05/29/14 10:07 AM      Result Value Ref Range   Lipase 24  11 - 59 U/L  I-STAT TROPOININ, ED     Status: None   Collection Time    05/29/14 10:16 AM      Result Value Ref Range   Troponin i, poc 0.01  0.00 - 0.08 ng/mL   Comment 3            Comment: Due to the release kinetics of cTnI,     a negative result within the first hours     of the onset of symptoms does not rule out     myocardial infarction with certainty.     If myocardial infarction is still suspected,     repeat the test at appropriate intervals.  I-STAT CHEM 8, ED     Status: Abnormal   Collection Time    05/29/14 10:18 AM      Result Value Ref Range   Sodium 139  137 - 147 mEq/L   Potassium 4.5  3.7 - 5.3 mEq/L   Chloride 103  96 - 112 mEq/L   BUN 32 (*) 6 - 23 mg/dL   Creatinine, Ser 7.82  0.50 - 1.35 mg/dL   Glucose, Bld 956 (*) 70 - 99 mg/dL   Calcium, Ion 2.13  0.86 - 1.30 mmol/L   TCO2 22  0 - 100 mmol/L   Hemoglobin 17.0   13.0 - 17.0 g/dL   HCT 57.8  46.9 - 62.9 %  HEPATIC FUNCTION PANEL     Status: Abnormal   Collection Time    05/29/14  2:16 PM      Result Value Ref Range   Total Protein 5.7 (*) 6.0 - 8.3 g/dL   Albumin 3.3 (*) 3.5 - 5.2 g/dL   AST 33  0 - 37 U/L   ALT 79 (*) 0 - 53 U/L   Alkaline Phosphatase 59  39 - 117 U/L   Total Bilirubin 0.8  0.3 - 1.2 mg/dL   Bilirubin,  Direct 0.2  0.0 - 0.3 mg/dL   Indirect Bilirubin 0.6  0.3 - 0.9 mg/dL  URINALYSIS, ROUTINE W REFLEX MICROSCOPIC     Status: Abnormal   Collection Time    05/29/14  3:05 PM      Result Value Ref Range   Color, Urine YELLOW  YELLOW   APPearance CLEAR  CLEAR   Specific Gravity, Urine 1.041 (*) 1.005 - 1.030   pH 5.0  5.0 - 8.0   Glucose, UA NEGATIVE  NEGATIVE mg/dL   Hgb urine dipstick NEGATIVE  NEGATIVE   Bilirubin Urine NEGATIVE  NEGATIVE   Ketones, ur NEGATIVE  NEGATIVE mg/dL   Protein, ur 30 (*) NEGATIVE mg/dL   Urobilinogen, UA 0.2  0.0 - 1.0 mg/dL   Nitrite NEGATIVE  NEGATIVE   Leukocytes, UA NEGATIVE  NEGATIVE  URINE MICROSCOPIC-ADD ON     Status: Abnormal   Collection Time    05/29/14  3:05 PM      Result Value Ref Range   Squamous Epithelial / LPF FEW (*) RARE   WBC, UA 0-2  <3 WBC/hpf   RBC / HPF 0-2  <3 RBC/hpf   Bacteria, UA FEW (*) RARE   Casts HYALINE CASTS (*) NEGATIVE   Urine-Other MUCOUS PRESENT     Ct Abdomen Pelvis W Contrast  05/29/2014   CLINICAL DATA:  Left lower quadrant abdominal pain and swelling for 3 days.  EXAM: CT ABDOMEN AND PELVIS WITH CONTRAST  TECHNIQUE: Multidetector CT imaging of the abdomen and pelvis was performed using the standard protocol following bolus administration of intravenous contrast.  CONTRAST:  80mL OMNIPAQUE IOHEXOL 300 MG/ML  SOLN  COMPARISON:  Chest CT 05/16/2014  FINDINGS: Lower chest: There are small bilateral pleural effusions with overlying atelectasis. No pericardial effusion. Three-vessel coronary artery calcifications are noted. The distal esophagus is grossly  normal.  Hepatobiliary: Benign right hepatic lobe cyst. No worrisome hepatic lesions or intrahepatic biliary dilatation. A calcified granuloma is noted in the lower aspect of the right hepatic lobe. The gallbladder wall may be slightly thickened. No obvious calcified gallstones or pericholecystic inflammatory change. Right upper quadrant ultrasound may be helpful for further evaluation. No common bile duct dilatation.  Pancreas: No focal hepatic lesions or evidence for acute pancreatitis.  Spleen: Normal size.  No focal lesions.  Adrenals/Urinary Tract: Left adrenal gland lesions are noted. These are likely benign adenomas. The right adrenal gland is normal. Both kidneys are unremarkable. No worrisome renal lesions or hydronephrosis. No obstructing ureteral calculi. The bladder appears normal.  Stomach/Bowel: The stomach, duodenum, small bowel and colon are unremarkable. No inflammatory changes, mass lesions or obstructive findings.  Vascular/Lymphatic: The aorta demonstrates Moderate to advanced atherosclerotic calcifications. No focal aneurysm or dissection. The branch vessels are patent. The major venous structures are patent.  Other: There is interstitial change am fluid in the retroperitoneum surrounding the kidneys and extending down into the extraperitoneal pelvis. I do not see an obvious explanation for these findings. It could be the sequela of a cleared urinary tract infection and or pyelonephritis. I do not see any obstructing ureteral calculi or hydronephrosis.  Pelvis: The bladder, prostate glands and seminal vesicles are unremarkable. No pelvic mass or adenopathy. Extraperitoneal pelvic fluid is noted. No inguinal mass or adenopathy.  Musculoskeletal: No significant findings. Bilateral pars defects are noted at L5.  IMPRESSION: 1. Retroperitoneal interstitial change/fluid/edema of uncertain etiology. I do not see any definite findings for acute pyelonephritis or obstructive uropathy, pancreatitis or  aortic abnormality. Retroperitoneal  peritonitis would be a consideration. No abdominal/pelvic mass or adenopathy. 2. Left adrenal gland lesions, likely benign adenomas. 3. Moderate to advanced atherosclerotic changes involving the aorta and branch vessels. 4. Possible inflammation of the gallbladder with suspected wall thickening. Right upper quadrant ultrasound may be helpful for further evaluation.   Electronically Signed   By: Loralie ChampagneMark  Gallerani M.D.   On: 05/29/2014 13:39   Dg Chest Port 1 View  05/29/2014   CLINICAL DATA:  CHF, recently discharged from hospital, now with shortness of breath, abdominal swelling for 3 days, personal history of coronary artery disease post MI and CABG, former smoker  EXAM: PORTABLE CHEST - 1 VIEW  COMPARISON:  Portable exam 1013 hr compared to 05/16/2014  FINDINGS: Enlargement of cardiac silhouette post CABG.  Mediastinal contours and pulmonary vascularity normal.  Mild interstitial changes at the inferior lungs bilaterally could reflect minimal residual edema.  Improved pulmonary edema and decreased pleural effusions versus previous exam.  No segmental consolidation or pneumothorax.  Osseous structures unremarkable.  IMPRESSION: Improved CHF.   Electronically Signed   By: Ulyses SouthwardMark  Boles M.D.   On: 05/29/2014 10:24   [place] Abdomen Limited Ruq  05/29/2014   CLINICAL DATA:  Mid abdominal pain, initial evaluation  EXAM: US ABDOMEN LIMITED - RIGHT UPPER QUADRANT  COMPARISON:  05/29/2014 CT scan  FINDINGS: Gallbladder:  Gallbladder wall is 9 mm in thickness. There is sludge in the gallbladder. There is at least 1 tiny calculus. There is a positive sonographic Murphy's sign.  Common bile duct:  Diameter: 3 mm  Liver:  15 mm cyst in the right lobe, otherwise negative  Incidental note of a small right pleural effusion  IMPRESSION: Findings concerning for acute cholecystitis   Electronically Signed   By: Esperanza Heiraymond  Rubner M.D.   On: 05/29/2014 16:21    VITALS: Blood pressure 126/95, pulse  140, temperature 97.6 F (36.4 C), temperature source Oral, resp. rate 20, SpO2 96.00%.  EXAM:  General: Alert, oriented x3, no distress Head: no evidence of trauma, PERRL, EOMI, no exophtalmos or lid lag, no myxedema, no xanthelasma; normal ears, nose and oropharynx Neck: 6-7 centimeters elevation in jugular venous pulsations and prompt hepatojugular reflux; brisk carotid pulses without delay and no carotid bruits Chest: clear to auscultation, no signs of consolidation by percussion or palpation, normal fremitus, symmetrical and full respiratory excursions Cardiovascular: normal position and quality of the apical impulse, rapid, occasionally irregular rhythm, normal first heart sound and loud second heart sounds, no rubs or gallops, no murmur Abdomen: The abdomen is moderately distended and is mildly tender in a diffuse pattern, without Murphy's sign or any peritoneal signs, no masses by palpation, no abnormal pulsatility or arterial bruits, normal bowel sounds, at least mild hepatomegaly Extremities: no clubbing, cyanosis; there is trace ankle bilateral edema; 2+ radial, ulnar and brachial pulses bilaterally; 2+ right femoral, posterior tibial and dorsalis pedis pulses; 2+ left femoral, posterior tibial and dorsalis pedis pulses; no subclavian or femoral bruits Neurological: grossly nonfocal   IMPRESSION:  [name] has abdominal symptoms that I think can be attributed to acute heart failure with pulmonary congestion and hepatic distention. He does have an elevated white blood cell count, but does not have fever or chills and has mildly elevated transaminases with normal alkaline phosphatase and bilirubin. On my exam, there are no peritoneal signs whatsoever.  I would welcome a surgical opinion to make sure that he does not indeed have acute cholecystitis, since this concern was raised by the ultrasound findings.  My suspicion is that he will improve with treatment of his congestive heart  failure.  He has clinical findings suggestive of biventricular failure and I think we should not delay treatment of his arrhythmia any further. We can expedite the treatment with a TEE guided electrical cardioversion. The risks and benefits of moderate sedation, TEE and electrical synchronized cardioversion were reviewed with the patient and his wife. They understand and agree to proceed. Hopefully this can be scheduled in the morning.  I don't think he will require cholecystectomy. If the surgical opinion is that cholecystectomy is necessary and is urgent, cardioversion will have to be canceled since we will be committing him to a month of uninterrupted full dose anticoagulation cardioversion.  He does have a history of coronary artery disease and 12 years have passed since his bypass procedure. He will also require a minimum workup for ischemia with a vasodilator nuclear imaging study, if not full cardiac catheterization and angiography of native and graft vessels.  ACE inhibitors or angiotensin receptor blockers should be started. For now he is on rate control with diltiazem in addition to oral metoprolol. Additional diuretics. We'll not stop his anticoagulant unless surgery tells [place] this is necessary.   Thurmon Fair, MD, [facility] [phone] office [phone] pager  05/29/2014, 7:55 PM

## 2014-05-29 NOTE — ED Notes (Signed)
Family offered something to drink.

## 2014-05-29 NOTE — Consult Note (Signed)
Reason for Consult:cholecystitis Referring Physician: Dr. Deberah Pelton [place] is an 68 y.o. male.  HPI:  Pt is a 68 yo M who was having shortness of breath and abdominal pain that has been worsening for the last several days.  He has a history of heart failure, and he was recently admitted for exacerbation of this.  He has had upper abdominal pain and discomfort for several months, but the episodes always got better on their own.  He describes mild nausea as well.  His wife states that he has had nausea with smells of certain foods.  He does not really have post prandial pain, however. He thought this was a side effect of his medication.  He denies fever or jaundice.  He also has a history of atrial flutter, and he is in this tonight.  He is on Eliquis for hes heart issues.    Past Medical History  Diagnosis Date  . MI (myocardial infarction)     silent MI (?x3) prior to CABG  . CHF (congestive heart failure)     Past Surgical History  Procedure Laterality Date  . Coronary artery bypass graft  2003    x 5    Family History  Problem Relation Age of Onset  . Cancer Mother   . Cancer Father     Social History:  reports that he quit smoking about 12 years ago. His smoking use included Cigarettes. He has a 40 pack-year smoking history. He has never used smokeless tobacco. He reports that he drinks alcohol. He reports that he does not use illicit drugs.  Allergies: No Known Allergies  Medications:  edicationsLong-Term  Outpatient Medications Hospital Medications    apixaban (ELIQUIS) 5 MG TABS tablet   aspirin EC 81 MG tablet   atorvastatin (LIPITOR) 20 MG tablet   furosemide (LASIX) 40 MG tablet   metoprolol succinate (TOPROL-XL) 100 MG 24 hr tablet   Multiple Vitamin (MULTIVITAMIN WITH MINERALS) TABS tablet     Results for orders placed during the hospital encounter of 05/29/14 (from the past 48 hour(s))  CBC     Status: Abnormal   Collection Time    05/29/14 10:07 AM        Result Value Ref Range   WBC 14.8 (*) 4.0 - 10.5 K/uL   RBC 5.13  4.22 - 5.81 MIL/uL   Hemoglobin 15.0  13.0 - 17.0 g/dL   HCT 62.1  30.8 - 65.7 %   MCV 91.8  78.0 - 100.0 fL   MCH 29.2  26.0 - 34.0 pg   MCHC 31.8  30.0 - 36.0 g/dL   RDW 84.6  96.2 - 95.2 %   Platelets 274  150 - 400 K/uL  PRO B NATRIURETIC PEPTIDE     Status: Abnormal   Collection Time    05/29/14 10:07 AM      Result Value Ref Range   Pro B Natriuretic peptide (BNP) 3333.0 (*) 0 - 125 pg/mL  LIPASE, BLOOD     Status: None   Collection Time    05/29/14 10:07 AM      Result Value Ref Range   Lipase 24  11 - 59 U/L  I-STAT TROPOININ, ED     Status: None   Collection Time    05/29/14 10:16 AM      Result Value Ref Range   Troponin i, poc 0.01  0.00 - 0.08 ng/mL   Comment 3  Comment: Due to the release kinetics of cTnI,     a negative result within the first hours     of the onset of symptoms does not rule out     myocardial infarction with certainty.     If myocardial infarction is still suspected,     repeat the test at appropriate intervals.  I-STAT CHEM 8, ED     Status: Abnormal   Collection Time    05/29/14 10:18 AM      Result Value Ref Range   Sodium 139  137 - 147 mEq/L   Potassium 4.5  3.7 - 5.3 mEq/L   Chloride 103  96 - 112 mEq/L   BUN 32 (*) 6 - 23 mg/dL   Creatinine, Ser 1.61  0.50 - 1.35 mg/dL   Glucose, Bld 096 (*) 70 - 99 mg/dL   Calcium, Ion 0.45  4.09 - 1.30 mmol/L   TCO2 22  0 - 100 mmol/L   Hemoglobin 17.0  13.0 - 17.0 g/dL   HCT 81.1  91.4 - 78.2 %  HEPATIC FUNCTION PANEL     Status: Abnormal   Collection Time    05/29/14  2:16 PM      Result Value Ref Range   Total Protein 5.7 (*) 6.0 - 8.3 g/dL   Albumin 3.3 (*) 3.5 - 5.2 g/dL   AST 33  0 - 37 U/L   ALT 79 (*) 0 - 53 U/L   Alkaline Phosphatase 59  39 - 117 U/L   Total Bilirubin 0.8  0.3 - 1.2 mg/dL   Bilirubin, Direct 0.2  0.0 - 0.3 mg/dL   Indirect Bilirubin 0.6  0.3 - 0.9 mg/dL  URINALYSIS, ROUTINE W  REFLEX MICROSCOPIC     Status: Abnormal   Collection Time    05/29/14  3:05 PM      Result Value Ref Range   Color, Urine YELLOW  YELLOW   APPearance CLEAR  CLEAR   Specific Gravity, Urine 1.041 (*) 1.005 - 1.030   pH 5.0  5.0 - 8.0   Glucose, UA NEGATIVE  NEGATIVE mg/dL   Hgb urine dipstick NEGATIVE  NEGATIVE   Bilirubin Urine NEGATIVE  NEGATIVE   Ketones, ur NEGATIVE  NEGATIVE mg/dL   Protein, ur 30 (*) NEGATIVE mg/dL   Urobilinogen, UA 0.2  0.0 - 1.0 mg/dL   Nitrite NEGATIVE  NEGATIVE   Leukocytes, UA NEGATIVE  NEGATIVE  URINE MICROSCOPIC-ADD ON     Status: Abnormal   Collection Time    05/29/14  3:05 PM      Result Value Ref Range   Squamous Epithelial / LPF FEW (*) RARE   WBC, UA 0-2  <3 WBC/hpf   RBC / HPF 0-2  <3 RBC/hpf   Bacteria, UA FEW (*) RARE   Casts HYALINE CASTS (*) NEGATIVE   Urine-Other MUCOUS PRESENT      Ct Abdomen Pelvis W Contrast  05/29/2014   CLINICAL DATA:  Left lower quadrant abdominal pain and swelling for 3 days.  EXAM: CT ABDOMEN AND PELVIS WITH CONTRAST  TECHNIQUE: Multidetector CT imaging of the abdomen and pelvis was performed using the standard protocol following bolus administration of intravenous contrast.  CONTRAST:  80mL OMNIPAQUE IOHEXOL 300 MG/ML  SOLN  COMPARISON:  Chest CT 05/16/2014  FINDINGS: Lower chest: There are small bilateral pleural effusions with overlying atelectasis. No pericardial effusion. Three-vessel coronary artery calcifications are noted. The distal esophagus is grossly normal.  Hepatobiliary: Benign right hepatic lobe cyst. No  worrisome hepatic lesions or intrahepatic biliary dilatation. A calcified granuloma is noted in the lower aspect of the right hepatic lobe. The gallbladder wall may be slightly thickened. No obvious calcified gallstones or pericholecystic inflammatory change. Right upper quadrant ultrasound may be helpful for further evaluation. No common bile duct dilatation.  Pancreas: No focal hepatic lesions or  evidence for acute pancreatitis.  Spleen: Normal size.  No focal lesions.  Adrenals/Urinary Tract: Left adrenal gland lesions are noted. These are likely benign adenomas. The right adrenal gland is normal. Both kidneys are unremarkable. No worrisome renal lesions or hydronephrosis. No obstructing ureteral calculi. The bladder appears normal.  Stomach/Bowel: The stomach, duodenum, small bowel and colon are unremarkable. No inflammatory changes, mass lesions or obstructive findings.  Vascular/Lymphatic: The aorta demonstrates Moderate to advanced atherosclerotic calcifications. No focal aneurysm or dissection. The branch vessels are patent. The major venous structures are patent.  Other: There is interstitial change am fluid in the retroperitoneum surrounding the kidneys and extending down into the extraperitoneal pelvis. I do not see an obvious explanation for these findings. It could be the sequela of a cleared urinary tract infection and or pyelonephritis. I do not see any obstructing ureteral calculi or hydronephrosis.  Pelvis: The bladder, prostate glands and seminal vesicles are unremarkable. No pelvic mass or adenopathy. Extraperitoneal pelvic fluid is noted. No inguinal mass or adenopathy.  Musculoskeletal: No significant findings. Bilateral pars defects are noted at L5.  IMPRESSION: 1. Retroperitoneal interstitial change/fluid/edema of uncertain etiology. I do not see any definite findings for acute pyelonephritis or obstructive uropathy, pancreatitis or aortic abnormality. Retroperitoneal peritonitis would be a consideration. No abdominal/pelvic mass or adenopathy. 2. Left adrenal gland lesions, likely benign adenomas. 3. Moderate to advanced atherosclerotic changes involving the aorta and branch vessels. 4. Possible inflammation of the gallbladder with suspected wall thickening. Right upper quadrant ultrasound may be helpful for further evaluation.   Electronically Signed   By: Loralie ChampagneMark  Gallerani M.D.   On:  05/29/2014 13:39   Dg Chest Port 1 View  05/29/2014   CLINICAL DATA:  CHF, recently discharged from hospital, now with shortness of breath, abdominal swelling for 3 days, personal history of coronary artery disease post MI and CABG, former smoker  EXAM: PORTABLE CHEST - 1 VIEW  COMPARISON:  Portable exam 1013 hr compared to 05/16/2014  FINDINGS: Enlargement of cardiac silhouette post CABG.  Mediastinal contours and pulmonary vascularity normal.  Mild interstitial changes at the inferior lungs bilaterally could reflect minimal residual edema.  Improved pulmonary edema and decreased pleural effusions versus previous exam.  No segmental consolidation or pneumothorax.  Osseous structures unremarkable.  IMPRESSION: Improved CHF.   Electronically Signed   By: Ulyses SouthwardMark  Boles M.D.   On: 05/29/2014 10:24   [place] Abdomen Limited Ruq  05/29/2014   CLINICAL DATA:  Mid abdominal pain, initial evaluation  EXAM: US ABDOMEN LIMITED - RIGHT UPPER QUADRANT  COMPARISON:  05/29/2014 CT scan  FINDINGS: Gallbladder:  Gallbladder wall is 9 mm in thickness. There is sludge in the gallbladder. There is at least 1 tiny calculus. There is a positive sonographic Murphy's sign.  Common bile duct:  Diameter: 3 mm  Liver:  15 mm cyst in the right lobe, otherwise negative  Incidental note of a small right pleural effusion  IMPRESSION: Findings concerning for acute cholecystitis   Electronically Signed   By: Esperanza Heiraymond  Rubner M.D.   On: 05/29/2014 16:21    Review of Systems  Constitutional: Positive for chills.  HENT: Negative.  Eyes: Negative.   Respiratory: Negative.   Cardiovascular: Positive for chest pain and palpitations.  Gastrointestinal: Positive for nausea, vomiting and abdominal pain. Negative for constipation.  Genitourinary: Negative.   Musculoskeletal: Negative.   Skin: Negative.   Neurological: Negative.   Endo/Heme/Allergies:       On eliquis  Psychiatric/Behavioral: Negative.    Blood pressure 128/90, pulse 60,  temperature 98.5 F (36.9 C), temperature source Oral, resp. rate 23, height 5\' 11"  (1.803 m), weight 221 lb 5.5 oz (100.4 kg), SpO2 91.00%. Physical Exam  Constitutional: He is oriented to person, place, and time. He appears well-developed and well-nourished. No distress.  HENT:  Head: Normocephalic and atraumatic.  Eyes: Conjunctivae are normal. Pupils are equal, round, and reactive to light. Right eye exhibits no discharge. Left eye exhibits no discharge. No scleral icterus.  Neck: Normal range of motion. Neck supple. No thyromegaly present.  Cardiovascular: Intact distal pulses.   Tachycardic, irregular beats.  Respiratory: Effort normal. No respiratory distress. He exhibits no tenderness.  GI: Soft. He exhibits distension (mildly). He exhibits no mass. There is tenderness (RUQ). There is no rebound and no guarding.  Musculoskeletal: He exhibits no edema and no tenderness.  Neurological: He is alert and oriented to person, place, and time.  Skin: Skin is warm and dry. No rash noted. He is not diaphoretic. No erythema. No pallor.  Psychiatric: He has a normal mood and affect. His behavior is normal. Judgment and thought content normal.    Assessment/Plan: Acute cholecystitis Iv antibiotics Will plan lap chole with IOC (Dr. Lindie SpruceWyatt)  Pt admitted to cardiology for rate control of atrial flutter and medical optimization prior to surgery. Will let eliquis wear off.   Surgery to follow.    Stanley Wells 05/29/2014, 11:33 PM

## 2014-05-29 NOTE — ED Provider Notes (Signed)
Case has been discussed with Dr. Brion AlimentBerge cardiology service who will come and evaluate the patient. Discussion regarding appropriate physician to be primary admitting will be held between cardiology, surgery, and possibly general internal medicine.  Dione Boozeavid Honesty Menta, MD 05/29/14 [place]

## 2014-05-29 NOTE — ED Notes (Signed)
Pt d/c'd from hospital for tx of chf.  Pt now c/o sob, swollen abd x 3 days.  HR afib in 130's.

## 2014-05-29 NOTE — ED Notes (Signed)
Patient returned from ct at this time

## 2014-05-29 NOTE — ED Notes (Signed)
Family at bedside. 

## 2014-05-30 ENCOUNTER — Inpatient Hospital Stay (HOSPITAL_COMMUNITY): Payer: Medicare Other

## 2014-05-30 ENCOUNTER — Encounter (HOSPITAL_COMMUNITY): Payer: Self-pay | Admitting: Physician Assistant

## 2014-05-30 DIAGNOSIS — K810 Acute cholecystitis: Principal | ICD-10-CM

## 2014-05-30 DIAGNOSIS — R1010 Upper abdominal pain, unspecified: Secondary | ICD-10-CM

## 2014-05-30 LAB — COMPREHENSIVE METABOLIC PANEL
ALBUMIN: 2.9 g/dL — AB (ref 3.5–5.2)
ALT: 66 U/L — ABNORMAL HIGH (ref 0–53)
ANION GAP: 14 (ref 5–15)
AST: 27 U/L (ref 0–37)
Alkaline Phosphatase: 57 U/L (ref 39–117)
BILIRUBIN TOTAL: 0.7 mg/dL (ref 0.3–1.2)
BUN: 32 mg/dL — AB (ref 6–23)
CHLORIDE: 104 meq/L (ref 96–112)
CO2: 20 mEq/L (ref 19–32)
CREATININE: 1.16 mg/dL (ref 0.50–1.35)
Calcium: 8.5 mg/dL (ref 8.4–10.5)
GFR, EST AFRICAN AMERICAN: 73 mL/min — AB (ref >90)
GFR, EST NON AFRICAN AMERICAN: 63 mL/min — AB (ref >90)
GLUCOSE: 134 mg/dL — AB (ref 70–99)
Potassium: 4.1 mEq/L (ref 3.7–5.3)
Sodium: 138 mEq/L (ref 137–147)
Total Protein: 5.1 g/dL — ABNORMAL LOW (ref 6.0–8.3)

## 2014-05-30 LAB — CBC WITH DIFFERENTIAL/PLATELET
BASOS PCT: 0 % (ref 0–1)
Basophils Absolute: 0.1 10*3/uL (ref 0.0–0.1)
Eosinophils Absolute: 0.2 10*3/uL (ref 0.0–0.7)
Eosinophils Relative: 1 % (ref 0–5)
HEMATOCRIT: 43.9 % (ref 39.0–52.0)
HEMOGLOBIN: 13.8 g/dL (ref 13.0–17.0)
LYMPHS ABS: 2.5 10*3/uL (ref 0.7–4.0)
Lymphocytes Relative: 21 % (ref 12–46)
MCH: 29.2 pg (ref 26.0–34.0)
MCHC: 31.4 g/dL (ref 30.0–36.0)
MCV: 92.8 fL (ref 78.0–100.0)
MONO ABS: 1 10*3/uL (ref 0.1–1.0)
MONOS PCT: 9 % (ref 3–12)
NEUTROS ABS: 8.4 10*3/uL — AB (ref 1.7–7.7)
Neutrophils Relative %: 69 % (ref 43–77)
Platelets: 241 10*3/uL (ref 150–400)
RBC: 4.73 MIL/uL (ref 4.22–5.81)
RDW: 15.1 % (ref 11.5–15.5)
WBC: 12.1 10*3/uL — ABNORMAL HIGH (ref 4.0–10.5)

## 2014-05-30 LAB — HEPARIN LEVEL (UNFRACTIONATED): Heparin Unfractionated: 1.28 IU/mL — ABNORMAL HIGH (ref 0.30–0.70)

## 2014-05-30 LAB — TROPONIN I
Troponin I: <0.3 ng/mL (ref <0.30)
Troponin I: <0.3 ng/mL (ref <0.30)

## 2014-05-30 LAB — APTT
APTT: 54 s — AB (ref 24–37)
aPTT: 31 seconds (ref 24–37)

## 2014-05-30 MED ORDER — STERILE WATER FOR INJECTION IJ SOLN
INTRAMUSCULAR | Status: AC
  Filled 2014-05-30: qty 10

## 2014-05-30 MED ORDER — SINCALIDE 5 MCG IJ SOLR
0.0200 ug/kg | Freq: Once | INTRAMUSCULAR | Status: AC
  Administered 2014-05-30: 2.01 ug via INTRAVENOUS

## 2014-05-30 MED ORDER — SINCALIDE 5 MCG IJ SOLR
INTRAMUSCULAR | Status: AC
  Administered 2014-05-30: 2.01 ug via INTRAVENOUS
  Filled 2014-05-30: qty 5

## 2014-05-30 MED ORDER — SINCALIDE 5 MCG IJ SOLR
INTRAMUSCULAR | Status: AC
  Filled 2014-05-30: qty 5

## 2014-05-30 MED ORDER — TECHNETIUM TC 99M MEBROFENIN IV KIT
5.0000 | PACK | Freq: Once | INTRAVENOUS | Status: AC | PRN
  Administered 2014-05-30: 5 via INTRAVENOUS

## 2014-05-30 MED ORDER — HEPARIN (PORCINE) IN NACL 100-0.45 UNIT/ML-% IJ SOLN
1600.0000 [IU]/h | INTRAMUSCULAR | Status: DC
  Administered 2014-05-30: 1350 [IU]/h via INTRAVENOUS
  Administered 2014-05-31: 1600 [IU]/h via INTRAVENOUS
  Filled 2014-05-30 (×4): qty 250

## 2014-05-30 NOTE — Progress Notes (Signed)
[facility] Progress Note    Subjective: The patient is doing well and awaiting TEE and now a HIDA scan ordered by me.  Objective: Vital signs in last 24 hours: Temp:  [97.4 F (36.3 C)-98.5 F (36.9 C)] 97.4 F (36.3 C) (10/27 0806) Pulse Rate:  [46-140] 59 (10/27 0806) Resp:  [16-31] 18 (10/27 0806) BP: (96-134)/(60-103) 121/74 mmHg (10/27 0806) SpO2:  [91 %-99 %] 97 % (10/27 0806) Weight:  [100.4 kg (221 lb 5.5 oz)] 100.4 kg (221 lb 5.5 oz) (10/27 0424)    Intake/Output from previous day: 10/26 0701 - 10/27 0700 In: 531.8 [P.O.:220; I.V.:111.8; IV Piggyback:200] Out: 1250 [Urine:1250] Intake/Output this shift:    General: No acute distress  Lungs: Clear.  Sats are good on room air.  Abd: Completely nontender, no RUQ tenderness, no Murphy's sign.  Extremities: No changes  Neuro: Intact  Lab Results:  @LABLAST2 (wbc:2,hgb:2,hct:2,plt:2) BMET  Recent Labs  05/29/14 1018 05/30/14 0336  NA 139 138  K 4.5 4.1  CL 103 104  CO2  --  20  GLUCOSE 141* 134*  BUN 32* 32*  CREATININE 1.20 1.16  CALCIUM  --  8.5   PT/INR No results found for this basename: LABPROT, INR,  in the last 72 hours ABG No results found for this basename: PHART, PCO2, PO2, HCO3,  in the last 72 hours  Studies/Results: Ct Abdomen Pelvis W Contrast  05/29/2014   CLINICAL DATA:  Left lower quadrant abdominal pain and swelling for 3 days.  EXAM: CT ABDOMEN AND PELVIS WITH CONTRAST  TECHNIQUE: Multidetector CT imaging of the abdomen and pelvis was performed using the standard protocol following bolus administration of intravenous contrast.  CONTRAST:  80mL OMNIPAQUE IOHEXOL 300 MG/ML  SOLN  COMPARISON:  Chest CT 05/16/2014  FINDINGS: Lower chest: There are small bilateral pleural effusions with overlying atelectasis. No pericardial effusion. Three-vessel coronary artery calcifications are noted. The distal esophagus is grossly normal.  Hepatobiliary: Benign right hepatic lobe cyst. No worrisome  hepatic lesions or intrahepatic biliary dilatation. A calcified granuloma is noted in the lower aspect of the right hepatic lobe. The gallbladder wall may be slightly thickened. No obvious calcified gallstones or pericholecystic inflammatory change. Right upper quadrant ultrasound may be helpful for further evaluation. No common bile duct dilatation.  Pancreas: No focal hepatic lesions or evidence for acute pancreatitis.  Spleen: Normal size.  No focal lesions.  Adrenals/Urinary Tract: Left adrenal gland lesions are noted. These are likely benign adenomas. The right adrenal gland is normal. Both kidneys are unremarkable. No worrisome renal lesions or hydronephrosis. No obstructing ureteral calculi. The bladder appears normal.  Stomach/Bowel: The stomach, duodenum, small bowel and colon are unremarkable. No inflammatory changes, mass lesions or obstructive findings.  Vascular/Lymphatic: The aorta demonstrates Moderate to advanced atherosclerotic calcifications. No focal aneurysm or dissection. The branch vessels are patent. The major venous structures are patent.  Other: There is interstitial change am fluid in the retroperitoneum surrounding the kidneys and extending down into the extraperitoneal pelvis. I do not see an obvious explanation for these findings. It could be the sequela of a cleared urinary tract infection and or pyelonephritis. I do not see any obstructing ureteral calculi or hydronephrosis.  Pelvis: The bladder, prostate glands and seminal vesicles are unremarkable. No pelvic mass or adenopathy. Extraperitoneal pelvic fluid is noted. No inguinal mass or adenopathy.  Musculoskeletal: No significant findings. Bilateral pars defects are noted at L5.  IMPRESSION: 1. Retroperitoneal interstitial change/fluid/edema of uncertain etiology. I do not see any definite  findings for acute pyelonephritis or obstructive uropathy, pancreatitis or aortic abnormality. Retroperitoneal peritonitis would be a  consideration. No abdominal/pelvic mass or adenopathy. 2. Left adrenal gland lesions, likely benign adenomas. 3. Moderate to advanced atherosclerotic changes involving the aorta and branch vessels. 4. Possible inflammation of the gallbladder with suspected wall thickening. Right upper quadrant ultrasound may be helpful for further evaluation.   Electronically Signed   By: Loralie ChampagneMark  Gallerani M.D.   On: 05/29/2014 13:39   Dg Chest Port 1 View  05/29/2014   CLINICAL DATA:  CHF, recently discharged from hospital, now with shortness of breath, abdominal swelling for 3 days, personal history of coronary artery disease post MI and CABG, former smoker  EXAM: PORTABLE CHEST - 1 VIEW  COMPARISON:  Portable exam 1013 hr compared to 05/16/2014  FINDINGS: Enlargement of cardiac silhouette post CABG.  Mediastinal contours and pulmonary vascularity normal.  Mild interstitial changes at the inferior lungs bilaterally could reflect minimal residual edema.  Improved pulmonary edema and decreased pleural effusions versus previous exam.  No segmental consolidation or pneumothorax.  Osseous structures unremarkable.  IMPRESSION: Improved CHF.   Electronically Signed   By: Ulyses SouthwardMark  Boles M.D.   On: 05/29/2014 10:24   [place] Abdomen Limited Ruq  05/29/2014   CLINICAL DATA:  Mid abdominal pain, initial evaluation  EXAM: US ABDOMEN LIMITED - RIGHT UPPER QUADRANT  COMPARISON:  05/29/2014 CT scan  FINDINGS: Gallbladder:  Gallbladder wall is 9 mm in thickness. There is sludge in the gallbladder. There is at least 1 tiny calculus. There is a positive sonographic Murphy's sign.  Common bile duct:  Diameter: 3 mm  Liver:  15 mm cyst in the right lobe, otherwise negative  Incidental note of a small right pleural effusion  IMPRESSION: Findings concerning for acute cholecystitis   Electronically Signed   By: Esperanza Heiraymond  Rubner M.D.   On: 05/29/2014 16:21    Anti-infectives: Anti-infectives   Start     Dose/Rate Route Frequency Ordered Stop    05/30/14 0000  ciprofloxacin (CIPRO) IVPB 400 mg     400 mg 200 mL/hr over 60 Minutes Intravenous Every 12 hours 05/29/14 2351        Assessment/Plan: s/p  Get HIDA scan today.  If + will discuss suirgical options with the patient.  LOS: 1 day   Marta LamasJames O. Gae BonWyatt, III, MD, FACS [phone]--pager [phone]--office [facility] 05/30/2014

## 2014-05-30 NOTE — Progress Notes (Signed)
Patient Name: Stanley Wells Date of Encounter: 05/30/2014  Principal Problem:   Acute cholecystitis Active Problems:   Atrial flutter with rapid ventricular response   Acute combined systolic and diastolic ACC/AHA stage C congestive heart failure   Hypertension goal BP (blood pressure) < 130/80   Abdominal discomfort in right upper quadrant   Chronic anticoagulation - Eliquis    Patient Profile: 68 yo male w/ hx atrial flutter, on Eliquis, CM (?2nd tachycardia), CAD/CABG, S-D-CHF, was admitted 10/26 w/ acute cholecystitis. Surgery following, once Eliquis wears off, may need lap choley  SUBJECTIVE: Feels much better than yesterday, SOB improved. Abd not tender.  OBJECTIVE Filed Vitals:   05/29/14 2212 05/30/14 0026 05/30/14 0424 05/30/14 0806  BP: 128/90 98/60 96/71  121/74  Pulse: 60 46 68 59  Temp: 98.5 F (36.9 C) 98.5 F (36.9 C) 98.4 F (36.9 C) 97.4 F (36.3 C)  TempSrc: Oral Oral Oral Oral  Resp: 23 16 19 18   Height: 5\' 11"  (1.803 m)     Weight: 221 lb 5.5 oz (100.4 kg)  221 lb 5.5 oz (100.4 kg)   SpO2: 91% 97% 96% 97%    Intake/Output Summary (Last 24 hours) at 05/30/14 0827 Last data filed at 05/30/14 0300  Gross per 24 hour  Intake 531.83 ml  Output   1250 ml  Net -718.17 ml   Filed Weights   05/29/14 2212 05/30/14 0424  Weight: 221 lb 5.5 oz (100.4 kg) 221 lb 5.5 oz (100.4 kg)    PHYSICAL EXAM General: Well developed, well nourished, male in no acute distress. Head: Normocephalic, atraumatic.  Neck: Supple without bruits, JVD 10 cm, JVP elevated as well.. Lungs:  Resp regular and unlabored, decreased BS bases. Heart: Irreg Irreg, S1, S2, no S3, S4, or murmur; no rub. Abdomen: Soft, non-tender, non-distended, BS decreased but present.  Extremities: No clubbing, cyanosis, no edema.  Neuro: Alert and oriented X 3. Moves all extremities spontaneously. Psych: Normal affect.  LABS: CBC:  Recent Labs  05/29/14 1007 05/29/14 1018  05/30/14 0336  WBC 14.8*  --  12.1*  NEUTROABS  --   --  8.4*  HGB 15.0 17.0 13.8  HCT 47.1 50.0 43.9  MCV 91.8  --  92.8  PLT 274  --  241   Basic Metabolic Panel:  Recent Labs  09/81/19 1018 05/30/14 0336  NA 139 138  K 4.5 4.1  CL 103 104  CO2  --  20  GLUCOSE 141* 134*  BUN 32* 32*  CREATININE 1.20 1.16  CALCIUM  --  8.5   Liver Function Tests:  Recent Labs  05/29/14 1416 05/30/14 0336  AST 33 27  ALT 79* 66*  ALKPHOS 59 57  BILITOT 0.8 0.7  PROT 5.7* 5.1*  ALBUMIN 3.3* 2.9*   Cardiac Enzymes:  Recent Labs  05/29/14 2243 05/30/14 0336  TROPONINI <0.30 <0.30    Recent Labs  05/29/14 1016  TROPIPOC 0.01   BNP: Pro B Natriuretic peptide (BNP)  Date/Time Value Ref Range Status  05/29/2014 10:07 AM 3333.0* 0 - 125 pg/mL Final  05/16/2014  4:14 PM 2265.0* 0 - 125 pg/mL Final   TELE: atrial flutter, rate generally < 110  Radiology/Studies: Ct Abdomen Pelvis W Contrast 05/29/2014   CLINICAL DATA:  Left lower quadrant abdominal pain and swelling for 3 days.  EXAM: CT ABDOMEN AND PELVIS WITH CONTRAST  TECHNIQUE: Multidetector CT imaging of the abdomen and pelvis was performed using the standard protocol following bolus administration  of intravenous contrast.  CONTRAST:  80mL OMNIPAQUE IOHEXOL 300 MG/ML  SOLN  COMPARISON:  Chest CT 05/16/2014  FINDINGS: Lower chest: There are small bilateral pleural effusions with overlying atelectasis. No pericardial effusion. Three-vessel coronary artery calcifications are noted. The distal esophagus is grossly normal.  Hepatobiliary: Benign right hepatic lobe cyst. No worrisome hepatic lesions or intrahepatic biliary dilatation. A calcified granuloma is noted in the lower aspect of the right hepatic lobe. The gallbladder wall may be slightly thickened. No obvious calcified gallstones or pericholecystic inflammatory change. Right upper quadrant ultrasound may be helpful for further evaluation. No common bile duct dilatation.   Pancreas: No focal hepatic lesions or evidence for acute pancreatitis.  Spleen: Normal size.  No focal lesions.  Adrenals/Urinary Tract: Left adrenal gland lesions are noted. These are likely benign adenomas. The right adrenal gland is normal. Both kidneys are unremarkable. No worrisome renal lesions or hydronephrosis. No obstructing ureteral calculi. The bladder appears normal.  Stomach/Bowel: The stomach, duodenum, small bowel and colon are unremarkable. No inflammatory changes, mass lesions or obstructive findings.  Vascular/Lymphatic: The aorta demonstrates Moderate to advanced atherosclerotic calcifications. No focal aneurysm or dissection. The branch vessels are patent. The major venous structures are patent.  Other: There is interstitial change am fluid in the retroperitoneum surrounding the kidneys and extending down into the extraperitoneal pelvis. I do not see an obvious explanation for these findings. It could be the sequela of a cleared urinary tract infection and or pyelonephritis. I do not see any obstructing ureteral calculi or hydronephrosis.  Pelvis: The bladder, prostate glands and seminal vesicles are unremarkable. No pelvic mass or adenopathy. Extraperitoneal pelvic fluid is noted. No inguinal mass or adenopathy.  Musculoskeletal: No significant findings. Bilateral pars defects are noted at L5.  IMPRESSION: 1. Retroperitoneal interstitial change/fluid/edema of uncertain etiology. I do not see any definite findings for acute pyelonephritis or obstructive uropathy, pancreatitis or aortic abnormality. Retroperitoneal peritonitis would be a consideration. No abdominal/pelvic mass or adenopathy. 2. Left adrenal gland lesions, likely benign adenomas. 3. Moderate to advanced atherosclerotic changes involving the aorta and branch vessels. 4. Possible inflammation of the gallbladder with suspected wall thickening. Right upper quadrant ultrasound may be helpful for further evaluation.   Electronically  Signed   By: Loralie ChampagneMark  Gallerani M.D.   On: 05/29/2014 13:39   Dg Chest Port 1 View 05/29/2014   CLINICAL DATA:  CHF, recently discharged from hospital, now with shortness of breath, abdominal swelling for 3 days, personal history of coronary artery disease post MI and CABG, former smoker  EXAM: PORTABLE CHEST - 1 VIEW  COMPARISON:  Portable exam 1013 hr compared to 05/16/2014  FINDINGS: Enlargement of cardiac silhouette post CABG.  Mediastinal contours and pulmonary vascularity normal.  Mild interstitial changes at the inferior lungs bilaterally could reflect minimal residual edema.  Improved pulmonary edema and decreased pleural effusions versus previous exam.  No segmental consolidation or pneumothorax.  Osseous structures unremarkable.  IMPRESSION: Improved CHF.   Electronically Signed   By: Ulyses SouthwardMark  Boles M.D.   On: 05/29/2014 10:24   [place] Abdomen Limited Ruq 05/29/2014   CLINICAL DATA:  Mid abdominal pain, initial evaluation  EXAM: US ABDOMEN LIMITED - RIGHT UPPER QUADRANT  COMPARISON:  05/29/2014 CT scan  FINDINGS: Gallbladder:  Gallbladder wall is 9 mm in thickness. There is sludge in the gallbladder. There is at least 1 tiny calculus. There is a positive sonographic Murphy's sign.  Common bile duct:  Diameter: 3 mm  Liver:  15 mm cyst  in the right lobe, otherwise negative  Incidental note of a small right pleural effusion  IMPRESSION: Findings concerning for acute cholecystitis   Electronically Signed   By: Esperanza Heiraymond  Rubner M.D.   On: 05/29/2014 16:21     Current Medications:  . apixaban  5 mg Oral BID  . aspirin EC  81 mg Oral Daily  . atorvastatin  20 mg Oral q1800  . ciprofloxacin  400 mg Intravenous Q12H  . furosemide  40 mg Intravenous Q12H  . lisinopril  5 mg Oral Daily  . metoprolol succinate  100 mg Oral Daily   . sodium chloride 20 mL/hr at 05/30/14 0000  . diltiazem (CARDIZEM) infusion 10 mg/hr (05/30/14 0220)    ASSESSMENT AND PLAN: Principal Problem:   Acute cholecystitis -  improved, per Dr Lindie SpruceWyatt, HIDA scan will determine need for surgery. Today at 11 am  Active Problems:   Atrial flutter with rapid ventricular response - rate control w/ IV Diltiatzem till taking POs well.    Acute combined systolic and diastolic ACC/AHA stage C congestive heart failure - still w/ some extra volume on board. Continue IV Lasix for now, follow RF, I/O, weights    Hypertension goal BP (blood pressure) < 130/80 - good control on rx    Abdominal discomfort in right upper quadrant - see above    Chronic anticoagulation - Eliquis - given last pm, hold till final surgical decision made.  Signed, Theodore Demarkhonda Barrett , PA-C 8:27 AM 05/30/2014  I have personally seen and examined this patient with Theodore Demarkhonda Barrett, PA-C. I agree with the assessment and plan as outlined above. He is admitted with 3 different issues. He was recently diagnosed with atrial flutter and discharged home on po beta blocker therapy. Now in the setting of possible acute cholecystitis has atrial flutter with RVR and volume overload. His CHF is likely due to his rapid ventricular rate. We will not arrange a TEE guided DCCV as he may have to have surgery and I would not want to interrupt his anti-coagulation post cardioversion for this. Will continue rate control with IV Cardizem. Will hold Eliquis with anticipation of surgery this week. Will start IV heparin. Continue Lasix IV today with evidence of volume overload on exam (elevated JVP). Of note, SOB and abdominal pain improved with current therapy. He may ultimately need TEE guided DCCV before discharge. Further plans to follow as surgical plans are finalized.   Junette Bernat 05/30/2014 9:19 AM

## 2014-05-30 NOTE — Consult Note (Signed)
ANTICOAGULATION CONSULT NOTE - Initial Consult  Pharmacy Consult for Heparin Indication: aflutter  No Known Allergies  Patient Measurements: Height: 5\' 11"  (180.3 cm) Weight: 221 lb 5.5 oz (100.4 kg) IBW/kg (Calculated) : 75.3 Heparin Dosing Weight: 96kg  Vital Signs: Temp: 97.4 F (36.3 C) (10/27 0806) Temp Source: Oral (10/27 0806) BP: 121/74 mmHg (10/27 0806) Pulse Rate: 59 (10/27 0806)  Labs:  Recent Labs  05/29/14 1007 05/29/14 1018 05/29/14 2243 05/30/14 0336  HGB 15.0 17.0  --  13.8  HCT 47.1 50.0  --  43.9  PLT 274  --   --  241  CREATININE  --  1.20  --  1.16  TROPONINI  --   --  <0.30 <0.30    Estimated Creatinine Clearance: 74.6 ml/min (by C-G formula based on Cr of 1.16).   Medical History: Past Medical History  Diagnosis Date  . MI (myocardial infarction)     silent MI (?x3) prior to CABG  . CHF (congestive heart failure)     chronic combined systolic and diastolic  . Chronic anticoagulation     Eliquis    Medications:  Apixaban PTA  Assessment: 68[facility] admission for acute CHF and aflutter RVR. He was discharged on apixaban with plans for outpatient cardioversion after 3-4 weeks. Yesterday he presented to the ED with SOB and abdominal pain and was found to be in aflutter. Surgery consulted for the abdominal pain - will get HIDA scan today and if positive will probably need surgery. Apixaban will be held and switched to IV heparin anticipating any surgical procedures. Last apixaban dose was today at 0001. Will use aPTTs to guide initial heparin dosing as apixaban can falsely elevated heparin levels.   Goal of Therapy:  APTT 66-102 seconds Heparin level 0.3-0.7 units/ml Monitor platelets by anticoagulation protocol: Yes   Plan:  1) Obtain baseline aPTT and heparin level 2) Start heparin at 1350 units/hr with no bolus at 1200 today 3) Check 6h aPTT 4) Daily aPTT, heparin level, CBC  Fredrik RiggerMarkle, Alyn Riedinger  Sue 05/30/2014,9:29 AM

## 2014-05-30 NOTE — Consult Note (Signed)
ANTICOAGULATION CONSULT NOTE -follow up Pharmacy Consult for Heparin Indication: aflutter  No Known Allergies  Patient Measurements: Height: 5\' 11"  (180.3 cm) Weight: 221 lb 5.5 oz (100.4 kg) IBW/kg (Calculated) : 75.3 Heparin Dosing Weight: 96kg  Vital Signs: Temp: 97.4 F (36.3 C) (10/27 1637) Temp Source: Oral (10/27 1637) BP: 102/83 mmHg (10/27 1637) Pulse Rate: 95 (10/27 1637)  Labs:  Recent Labs  05/29/14 1007 05/29/14 1018 05/29/14 2243 05/30/14 0336 05/30/14 0953 05/30/14 1832  HGB 15.0 17.0  --  13.8  --   --   HCT 47.1 50.0  --  43.9  --   --   PLT 274  --   --  241  --   --   APTT  --   --   --   --  31 54*  HEPARINUNFRC  --   --   --   --  1.28*  --   CREATININE  --  1.20  --  1.16  --   --   TROPONINI  --   --  <0.30 <0.30 <0.30  --     Estimated Creatinine Clearance: 74.6 ml/min (by C-G formula based on Cr of 1.16).   Medications:  Apixaban PTA  Assessment: Stanley Wells [facility] after admission for acute CHF and aflutter RVR. He was discharged on apixaban with plans for outpatient cardioversion after 3-4 weeks. Yesterday he presented to the ED with SOB and abdominal pain and was found to be in aflutter. Surgery consulted for the abdominal pain - will get HIDA scan today and if positive will probably need surgery. Apixaban will be held and switched to IV heparin anticipating any surgical procedures. Last apixaban dose was today at 0001. Will use aPTTs to guide initial heparin dosing as apixaban can falsely elevated heparin levels.   Heparin ordered to start with no bolus at 12 noon today.  Heparin drip started at 1517.  aPTT drawn at 1832 is low at 54 sec, but only 3 hours after drip started, so not steady state.  Baseline aPTT is 31.   Goal of Therapy:  APTT 66-102 seconds Heparin level 0.3-0.7 units/ml Monitor platelets by anticoagulation protocol: Yes   Plan:  -increase heparin drip to 1450 units/hr and check aPTT in 6 hrs - RN  informed  Daily aPTT, heparin level, CBC Herby AbrahamMichelle T. Shaketta Rill, Pharm.D. [phone] 05/30/2014 7:27 PM

## 2014-05-31 ENCOUNTER — Encounter: Payer: Medicare Other | Admitting: Physician Assistant

## 2014-05-31 LAB — BASIC METABOLIC PANEL
ANION GAP: 13 (ref 5–15)
BUN: 29 mg/dL — ABNORMAL HIGH (ref 6–23)
CO2: 24 meq/L (ref 19–32)
CREATININE: 1.25 mg/dL (ref 0.50–1.35)
Calcium: 8.9 mg/dL (ref 8.4–10.5)
Chloride: 102 mEq/L (ref 96–112)
GFR calc non Af Amer: 58 mL/min — ABNORMAL LOW (ref >90)
GFR, EST AFRICAN AMERICAN: 67 mL/min — AB (ref >90)
Glucose, Bld: 121 mg/dL — ABNORMAL HIGH (ref 70–99)
Potassium: 4.2 mEq/L (ref 3.7–5.3)
SODIUM: 139 meq/L (ref 137–147)

## 2014-05-31 LAB — CBC
HCT: 46 % (ref 39.0–52.0)
Hemoglobin: 14.8 g/dL (ref 13.0–17.0)
MCH: 29.5 pg (ref 26.0–34.0)
MCHC: 32.2 g/dL (ref 30.0–36.0)
MCV: 91.8 fL (ref 78.0–100.0)
Platelets: 255 10*3/uL (ref 150–400)
RBC: 5.01 MIL/uL (ref 4.22–5.81)
RDW: 15.4 % (ref 11.5–15.5)
WBC: 13.2 10*3/uL — AB (ref 4.0–10.5)

## 2014-05-31 LAB — APTT: APTT: 63 s — AB (ref 24–37)

## 2014-05-31 LAB — HEPARIN LEVEL (UNFRACTIONATED): HEPARIN UNFRACTIONATED: 0.77 [IU]/mL — AB (ref 0.30–0.70)

## 2014-05-31 MED ORDER — DILTIAZEM HCL ER COATED BEADS 240 MG PO CP24
240.0000 mg | ORAL_CAPSULE | Freq: Every day | ORAL | Status: DC
  Administered 2014-05-31 – 2014-06-01 (×2): 240 mg via ORAL
  Filled 2014-05-31 (×3): qty 1

## 2014-05-31 MED ORDER — FUROSEMIDE 40 MG PO TABS
40.0000 mg | ORAL_TABLET | Freq: Two times a day (BID) | ORAL | Status: DC
  Administered 2014-05-31 – 2014-06-02 (×4): 40 mg via ORAL
  Filled 2014-05-31 (×6): qty 1

## 2014-05-31 MED ORDER — SODIUM CHLORIDE 0.9 % IV SOLN
INTRAVENOUS | Status: DC
  Administered 2014-05-31: 10:00:00 via INTRAVENOUS

## 2014-05-31 MED ORDER — APIXABAN 5 MG PO TABS
5.0000 mg | ORAL_TABLET | Freq: Two times a day (BID) | ORAL | Status: DC
  Administered 2014-05-31 – 2014-06-02 (×5): 5 mg via ORAL
  Filled 2014-05-31 (×7): qty 1

## 2014-05-31 NOTE — Progress Notes (Signed)
     SUBJECTIVE: Feeling much better. No chest pain. Breathing improved. No abd pain. No N/V.   BP 112/76  Pulse 61  Temp(Src) 97.6 F (36.4 C) (Oral)  Resp 25  Ht 5\' 11"  (1.803 m)  Wt 220 lb 7.4 oz (100 kg)  BMI 30.76 kg/m2  SpO2 100%  Intake/Output Summary (Last 24 hours) at 05/31/14 0910 Last data filed at 05/31/14 0857  Gross per 24 hour  Intake 1552.5 ml  Output   1150 ml  Net  402.5 ml    PHYSICAL EXAM General: Well developed, well nourished, in no acute distress. Alert and oriented x 3.  Psych:  Good affect, responds appropriately Neck: No JVD. No masses noted.  Lungs: Clear bilaterally with no wheezes or rhonci noted.  Heart: RRR with no murmurs noted. Abdomen: Bowel sounds are present. Soft, non-tender.  Extremities: Trace bilateral lower extremity edema.   LABS: Basic Metabolic Panel:  Recent Labs  16/05/9609/27/15 0336 05/31/14 0231  NA 138 139  K 4.1 4.2  CL 104 102  CO2 20 24  GLUCOSE 134* 121*  BUN 32* 29*  CREATININE 1.16 1.25  CALCIUM 8.5 8.9   CBC:  Recent Labs  05/29/14 1018 05/30/14 0336 05/31/14 0231  WBC  --  12.1* 13.2*  NEUTROABS  --  8.4*  --   HGB 17.0 13.8 14.8  HCT 50.0 43.9 46.0  MCV  --  92.8 91.8  PLT  --  241 255   Cardiac Enzymes:  Recent Labs  05/29/14 2243 05/30/14 0336 05/30/14 0953  TROPONINI <0.30 <0.30 <0.30   Current Meds: . aspirin EC  81 mg Oral Daily  . atorvastatin  20 mg Oral q1800  . ciprofloxacin  400 mg Intravenous Q12H  . furosemide  40 mg Intravenous Q12H  . lisinopril  5 mg Oral Daily  . metoprolol succinate  100 mg Oral Daily   ASSESSMENT AND PLAN:  1. Atrial flutter: Rate controlled on IV Diltiazem but with readmission with RVR and CHF, will need TEE/DCCV to return to sinus and hopefully preventing readmission with CHF. Will make NPO at midnight and plan TEE/DCCV tomorrow at 11am. Will restart apixiban 5 mg po BID. Will change IV diltiazem to po cardizem.   2. Acute cholecystitis: No need  for surgery or drain per surgery. Advance diet. Will d/c antibiotics.   3. Acute combined systolic and diastolic CHF: Volume status is much better. Will change to po Lasix today.    4. Hypertension: BP controlled.   Transfer to telemetry unit    MCALHANY,CHRISTOPHER  10/28/20159:10 AM

## 2014-05-31 NOTE — Progress Notes (Signed)
ANTICOAGULATION CONSULT NOTE - Follow Up Consult  Pharmacy Consult for heparin Indication: Aflutter  Labs:  Recent Labs  05/29/14 1007 05/29/14 1018 05/29/14 2243 05/30/14 0336 05/30/14 0953 05/30/14 1832 05/31/14 0231  HGB 15.0 17.0  --  13.8  --   --  14.8  HCT 47.1 50.0  --  43.9  --   --  46.0  PLT 274  --   --  241  --   --  255  APTT  --   --   --   --  31 54* 63*  HEPARINUNFRC  --   --   --   --  1.28*  --  0.77*  CREATININE  --  1.20  --  1.16  --   --  1.25  TROPONINI  --   --  <0.30 <0.30 <0.30  --   --     Assessment: 68yo male remains subtherapeutic on heparin after rate increase (using PTT as apixaban is still affecting heparin levels.  Goal of Therapy:  Heparin level 66-102 units/ml   Plan:  Will increase heparin gtt by 1-2 units/kg/hr to 1600 units/hr and check PTT in 6hr.  Vernard GamblesVeronda Fynley Chrystal, PharmD, BCPS  05/31/2014,3:54 AM

## 2014-05-31 NOTE — Progress Notes (Signed)
[facility] Progress Note    Subjective: Patient had a completely normal HIDA scan yesterday.  No need for surgery or percutaneous drain.  Objective: Vital signs in last 24 hours: Temp:  [97.4 F (36.3 C)-97.9 F (36.6 C)] 97.9 F (36.6 C) (10/28 0415) Pulse Rate:  [50-97] 61 (10/28 0415) Resp:  [18-25] 25 (10/27 2105) BP: (94-121)/(45-83) 114/81 mmHg (10/28 0415) SpO2:  [95 %-100 %] 100 % (10/28 0415) Weight:  [100 kg (220 lb 7.4 oz)] 100 kg (220 lb 7.4 oz) (10/28 0415)    Intake/Output from previous day: 10/27 0701 - 10/28 0700 In: 1212.5 [P.O.:220; I.V.:592.5; IV Piggyback:400] Out: 1150 [Urine:1150] Intake/Output this shift:    General: No distress  Lungs: Clear  Abd: Completely benign.  Nontender.  Extremities: No changes  Neuro: Intact  Lab Results:  @LABLAST2 (wbc:2,hgb:2,hct:2,plt:2) BMET  Recent Labs  05/30/14 0336 05/31/14 0231  NA 138 139  K 4.1 4.2  CL 104 102  CO2 20 24  GLUCOSE 134* 121*  BUN 32* 29*  CREATININE 1.16 1.25  CALCIUM 8.5 8.9   PT/INR No results found for this basename: LABPROT, INR,  in the last 72 hours ABG No results found for this basename: PHART, PCO2, PO2, HCO3,  in the last 72 hours  Studies/Results: Nm Hepatobiliary Including Gb  05/30/2014   CLINICAL DATA:  Abdominal pain with abnormal ultrasound concerning for potential cholecystitis  EXAM: NUCLEAR MEDICINE HEPATOBILIARY IMAGING WITH GALLBLADDER EF  Views:  Anterior, right lateral right upper quadrant  Radionuclide:  Technetium 8872m Choletec  Dose:  5.0 mCi  Route of administration: Intravenous  COMPARISON:  Ultrasound right upper quadrant May 29, 2014  FINDINGS: Liver uptake of radiotracer is normal. There is prompt visualization of gallbladder and small bowel, indicating patency of the cystic and common bile ducts. A weight based dose, 2.01 mcg, of CCK was administered intravenously with calculation of the computer generated ejection fraction of radiotracer from the  gallbladder. The patient did not experience symptoms with the CCK infusion. The computer generated ejection fraction of radiotracer from the gallbladder is normal at 96.3%, normal greater than 38%.  IMPRESSION: Study within normal limits.   Electronically Signed   By: Bretta BangWilliam  Woodruff M.D.   On: 05/30/2014 16:05   Ct Abdomen Pelvis W Contrast  05/29/2014   CLINICAL DATA:  Left lower quadrant abdominal pain and swelling for 3 days.  EXAM: CT ABDOMEN AND PELVIS WITH CONTRAST  TECHNIQUE: Multidetector CT imaging of the abdomen and pelvis was performed using the standard protocol following bolus administration of intravenous contrast.  CONTRAST:  80mL OMNIPAQUE IOHEXOL 300 MG/ML  SOLN  COMPARISON:  Chest CT 05/16/2014  FINDINGS: Lower chest: There are small bilateral pleural effusions with overlying atelectasis. No pericardial effusion. Three-vessel coronary artery calcifications are noted. The distal esophagus is grossly normal.  Hepatobiliary: Benign right hepatic lobe cyst. No worrisome hepatic lesions or intrahepatic biliary dilatation. A calcified granuloma is noted in the lower aspect of the right hepatic lobe. The gallbladder wall may be slightly thickened. No obvious calcified gallstones or pericholecystic inflammatory change. Right upper quadrant ultrasound may be helpful for further evaluation. No common bile duct dilatation.  Pancreas: No focal hepatic lesions or evidence for acute pancreatitis.  Spleen: Normal size.  No focal lesions.  Adrenals/Urinary Tract: Left adrenal gland lesions are noted. These are likely benign adenomas. The right adrenal gland is normal. Both kidneys are unremarkable. No worrisome renal lesions or hydronephrosis. No obstructing ureteral calculi. The bladder appears normal.  Stomach/Bowel: The  stomach, duodenum, small bowel and colon are unremarkable. No inflammatory changes, mass lesions or obstructive findings.  Vascular/Lymphatic: The aorta demonstrates Moderate to advanced  atherosclerotic calcifications. No focal aneurysm or dissection. The branch vessels are patent. The major venous structures are patent.  Other: There is interstitial change am fluid in the retroperitoneum surrounding the kidneys and extending down into the extraperitoneal pelvis. I do not see an obvious explanation for these findings. It could be the sequela of a cleared urinary tract infection and or pyelonephritis. I do not see any obstructing ureteral calculi or hydronephrosis.  Pelvis: The bladder, prostate glands and seminal vesicles are unremarkable. No pelvic mass or adenopathy. Extraperitoneal pelvic fluid is noted. No inguinal mass or adenopathy.  Musculoskeletal: No significant findings. Bilateral pars defects are noted at L5.  IMPRESSION: 1. Retroperitoneal interstitial change/fluid/edema of uncertain etiology. I do not see any definite findings for acute pyelonephritis or obstructive uropathy, pancreatitis or aortic abnormality. Retroperitoneal peritonitis would be a consideration. No abdominal/pelvic mass or adenopathy. 2. Left adrenal gland lesions, likely benign adenomas. 3. Moderate to advanced atherosclerotic changes involving the aorta and branch vessels. 4. Possible inflammation of the gallbladder with suspected wall thickening. Right upper quadrant ultrasound may be helpful for further evaluation.   Electronically Signed   By: Loralie ChampagneMark  Gallerani M.D.   On: 05/29/2014 13:39   Dg Chest Port 1 View  05/29/2014   CLINICAL DATA:  CHF, recently discharged from hospital, now with shortness of breath, abdominal swelling for 3 days, personal history of coronary artery disease post MI and CABG, former smoker  EXAM: PORTABLE CHEST - 1 VIEW  COMPARISON:  Portable exam 1013 hr compared to 05/16/2014  FINDINGS: Enlargement of cardiac silhouette post CABG.  Mediastinal contours and pulmonary vascularity normal.  Mild interstitial changes at the inferior lungs bilaterally could reflect minimal residual edema.   Improved pulmonary edema and decreased pleural effusions versus previous exam.  No segmental consolidation or pneumothorax.  Osseous structures unremarkable.  IMPRESSION: Improved CHF.   Electronically Signed   By: Ulyses SouthwardMark  Boles M.D.   On: 05/29/2014 10:24   [place] Abdomen Limited Ruq  05/29/2014   CLINICAL DATA:  Mid abdominal pain, initial evaluation  EXAM: US ABDOMEN LIMITED - RIGHT UPPER QUADRANT  COMPARISON:  05/29/2014 CT scan  FINDINGS: Gallbladder:  Gallbladder wall is 9 mm in thickness. There is sludge in the gallbladder. There is at least 1 tiny calculus. There is a positive sonographic Murphy's sign.  Common bile duct:  Diameter: 3 mm  Liver:  15 mm cyst in the right lobe, otherwise negative  Incidental note of a small right pleural effusion  IMPRESSION: Findings concerning for acute cholecystitis   Electronically Signed   By: Esperanza Heiraymond  Rubner M.D.   On: 05/29/2014 16:21    Anti-infectives: Anti-infectives   Start     Dose/Rate Route Frequency Ordered Stop   05/30/14 0000  ciprofloxacin (CIPRO) IVPB 400 mg     400 mg 200 mL/hr over 60 Minutes Intravenous Every 12 hours 05/29/14 2351        Assessment/Plan: s/p  Advance diet We will sign off.  LOS: 2 days   Marta LamasJames O. Gae BonWyatt, III, MD, FACS [phone]--pager [phone]--office [facility] 05/31/2014

## 2014-06-01 ENCOUNTER — Encounter (HOSPITAL_COMMUNITY): Payer: Medicare Other | Admitting: Anesthesiology

## 2014-06-01 ENCOUNTER — Inpatient Hospital Stay (HOSPITAL_COMMUNITY): Payer: Medicare Other | Admitting: Anesthesiology

## 2014-06-01 ENCOUNTER — Encounter (HOSPITAL_COMMUNITY)
Admission: EM | Disposition: A | Discharge status: Home / Self Care | Payer: Self-pay | Source: Home / Self Care | Attending: Cardiovascular Disease

## 2014-06-01 ENCOUNTER — Encounter (HOSPITAL_COMMUNITY): Payer: Self-pay | Admitting: *Deleted

## 2014-06-01 DIAGNOSIS — I4892 Unspecified atrial flutter: Secondary | ICD-10-CM

## 2014-06-01 DIAGNOSIS — I10 Essential (primary) hypertension: Secondary | ICD-10-CM

## 2014-06-01 DIAGNOSIS — I341 Nonrheumatic mitral (valve) prolapse: Secondary | ICD-10-CM

## 2014-06-01 HISTORY — PX: TEE WITHOUT CARDIOVERSION: SHX5443

## 2014-06-01 HISTORY — PX: CARDIOVERSION: SHX1299

## 2014-06-01 LAB — BASIC METABOLIC PANEL
Anion gap: 10 (ref 5–15)
BUN: 25 mg/dL — ABNORMAL HIGH (ref 6–23)
CALCIUM: 8.6 mg/dL (ref 8.4–10.5)
CHLORIDE: 102 meq/L (ref 96–112)
CO2: 27 mEq/L (ref 19–32)
CREATININE: 1.19 mg/dL (ref 0.50–1.35)
GFR calc Af Amer: 71 mL/min — ABNORMAL LOW (ref >90)
GFR calc non Af Amer: 61 mL/min — ABNORMAL LOW (ref >90)
GLUCOSE: 113 mg/dL — AB (ref 70–99)
Potassium: 4 mEq/L (ref 3.7–5.3)
Sodium: 139 mEq/L (ref 137–147)

## 2014-06-01 LAB — HCG, SERUM, QUALITATIVE: Preg, Serum: NEGATIVE

## 2014-06-01 SURGERY — ECHOCARDIOGRAM, TRANSESOPHAGEAL
Anesthesia: Monitor Anesthesia Care

## 2014-06-01 MED ORDER — PROPOFOL 10 MG/ML IV BOLUS
INTRAVENOUS | Status: DC | PRN
  Administered 2014-06-01: 50 mg via INTRAVENOUS

## 2014-06-01 MED ORDER — CIPROFLOXACIN HCL 500 MG PO TABS
500.0000 mg | ORAL_TABLET | Freq: Two times a day (BID) | ORAL | Status: DC
  Administered 2014-06-01 – 2014-06-02 (×3): 500 mg via ORAL
  Filled 2014-06-01 (×5): qty 1

## 2014-06-01 MED ORDER — LACTATED RINGERS IV SOLN
INTRAVENOUS | Status: DC | PRN
  Administered 2014-06-01: 11:00:00 via INTRAVENOUS

## 2014-06-01 MED ORDER — PROPOFOL INFUSION 10 MG/ML OPTIME
INTRAVENOUS | Status: DC | PRN
  Administered 2014-06-01: 50 ug/kg/min via INTRAVENOUS

## 2014-06-01 NOTE — Anesthesia Preprocedure Evaluation (Addendum)
Anesthesia Evaluation  Patient identified by MRN, date of birth, ID band Patient awake    Reviewed: Allergy & Precautions, H&P , NPO status , Patient's Chart, lab work & pertinent test results, reviewed documented beta blocker date and time   History of Anesthesia Complications Negative for: history of anesthetic complications  Airway Mallampati: II  TM Distance: >3 FB Neck ROM: Full    Dental  (+) Teeth Intact, Dental Advisory Given   Pulmonary former smoker (quit '03),  breath sounds clear to auscultation        Cardiovascular hypertension, Pt. on medications and Pt. on home beta blockers + CAD, + Past MI, + CABG and +CHF + dysrhythmias (eliquis) Atrial Fibrillation Rhythm:Irregular Rate:Normal  05/17/14 ECHO: EF 30-35%, valves OK   Neuro/Psych negative neurological ROS     GI/Hepatic negative GI ROS, Neg liver ROS,   Endo/Other  Morbid obesity  Renal/GU negative Renal ROS     Musculoskeletal   Abdominal (+) + obese,   Peds  Hematology   Anesthesia Other Findings   Reproductive/Obstetrics                        Anesthesia Physical Anesthesia Plan  ASA: III  Anesthesia Plan: MAC   Post-op Pain Management:    Induction: Intravenous  Airway Management Planned: Nasal Cannula  Additional Equipment:   Intra-op Plan:   Post-operative Plan:   Informed Consent: I have reviewed the patients History and Physical, chart, labs and discussed the procedure including the risks, benefits and alternatives for the proposed anesthesia with the patient or authorized representative who has indicated his/her understanding and acceptance.   Dental advisory given  Plan Discussed with: Anesthesiologist, Surgeon and CRNA  Anesthesia Plan Comments: (Plan routine monitors, MAC)       Anesthesia Quick Evaluation

## 2014-06-01 NOTE — Progress Notes (Signed)
  Echocardiogram Echocardiogram Transesophageal has been performed.  Dorothey BasemanReel, Travas M 06/01/2014, 11:59 AM

## 2014-06-01 NOTE — Interval H&P Note (Signed)
History and Physical Interval Note:  06/01/2014 9:41 AM  Stanley Wells  has presented today for surgery, with the diagnosis of AFLUTTER  The various methods of treatment have been discussed with the patient and family. After consideration of risks, benefits and other options for treatment, the patient has consented to  Procedure(s): TRANSESOPHAGEAL ECHOCARDIOGRAM (TEE) (N/A) CARDIOVERSION (N/A) as a surgical intervention .  The patient's history has been reviewed, patient examined, no change in status, stable for surgery.  I have reviewed the patient's chart and labs.  Questions were answered to the patient's satisfaction.     Lars MassonNELSON, [place]path H

## 2014-06-01 NOTE — H&P (View-Only) (Signed)
     SUBJECTIVE: No chest pain or SOB.   Tele: appears to be flutter. Rate 65-75 bpm  BP 147/81  Pulse 82  Temp(Src) 97.6 F (36.4 C) (Oral)  Resp 20  Ht 5\' 11"  (1.803 m)  Wt 222 lb 14.2 oz (101.1 kg)  BMI 31.10 kg/m2  SpO2 98%  Intake/Output Summary (Last 24 hours) at 06/01/14 16100628 Last data filed at 06/01/14 0042  Gross per 24 hour  Intake   1244 ml  Output    475 ml  Net    769 ml    PHYSICAL EXAM General: Well developed, well nourished, in no acute distress. Alert and oriented x 3.  Psych:  Good affect, responds appropriately Neck: No JVD. No masses noted.  Lungs: Clear bilaterally with no wheezes or rhonci noted.  Heart: RRR with no murmurs noted. Abdomen: Bowel sounds are present. Soft, non-tender.  Extremities: No lower extremity edema.   LABS: Basic Metabolic Panel:  Recent Labs  96/11/5408/27/15 0336 05/31/14 0231  NA 138 139  K 4.1 4.2  CL 104 102  CO2 20 24  GLUCOSE 134* 121*  BUN 32* 29*  CREATININE 1.16 1.25  CALCIUM 8.5 8.9   CBC:  Recent Labs  05/29/14 1018 05/30/14 0336 05/31/14 0231  WBC  --  12.1* 13.2*  NEUTROABS  --  8.4*  --   HGB 17.0 13.8 14.8  HCT 50.0 43.9 46.0  MCV  --  92.8 91.8  PLT  --  241 255   Cardiac Enzymes:  Recent Labs  05/29/14 2243 05/30/14 0336 05/30/14 0953  TROPONINI <0.30 <0.30 <0.30   Current Meds: . apixaban  5 mg Oral BID  . aspirin EC  81 mg Oral Daily  . atorvastatin  20 mg Oral q1800  . diltiazem  240 mg Oral Daily  . furosemide  40 mg Oral BID  . lisinopril  5 mg Oral Daily  . metoprolol succinate  100 mg Oral Daily   ASSESSMENT AND PLAN:   1. Atrial flutter: Rate controlled on Diltiazem and Toprol but with readmission with RVR and CHF, will need TEE/DCCV to return to sinus and hopefully preventing readmission with CHF. Tele appears to show atrial flutter this am. Will get EKG to document flutter. TEE/DCCV today at 11am. Restarted apixiban 5 mg po BID on 05/31/14. He had been on heparin drip  while we were considering need for cholecystectomy and there was no period of time where he was not anti-coagulated.   2. Acute cholecystitis: No need for surgery or drain per surgery. Advance diet. Cipro 500 mg po BID for 7 day course.    3. Acute combined systolic and diastolic CHF: Volume status is much better. Continue po Lasix.   4. Hypertension: BP controlled.    MCALHANY,CHRISTOPHER  10/29/20156:28 AM

## 2014-06-01 NOTE — Progress Notes (Signed)
Report given to receiving RN. Patient in bed resting with family at bed side. No verbal complaints and no signs or symptoms of distress or discomfort noted.  

## 2014-06-01 NOTE — Interval H&P Note (Signed)
History and Physical Interval Note:  06/01/2014 9:41 AM  Stanley Wells  has presented today for surgery, with the diagnosis of AFLUTTER  The various methods of treatment have been discussed with the patient and family. After consideration of risks, benefits and other options for treatment, the patient has consented to  Procedure(s): TRANSESOPHAGEAL ECHOCARDIOGRAM (TEE) (N/A) CARDIOVERSION (N/A) as a surgical intervention .  The patient's history has been reviewed, patient examined, no change in status, stable for surgery.  I have reviewed the patient's chart and labs.  Questions were answered to the patient's satisfaction.     Ambreen Tufte H   

## 2014-06-01 NOTE — CV Procedure (Signed)
     Transesophageal Echocardiogram Note  Debby FreibergJames C Coyt [id number] 06/09/1946  Procedure: Transesophageal Echocardiogram Indications: atrial flutter  Procedure Details Consent: Obtained Time Out: Verified patient identification, verified procedure, site/side was marked, verified correct patient position, special equipment/implants available, Radiology Safety Procedures followed,  medications/allergies/relevent history reviewed, required imaging and test results available.  Performed  Medications: IV propofol by anesthesia staff  Left Ventrical:   The cavity size was normal. There was mild concentric hypertrophy. Systolic function was moderately to severely reduced. The estimated ejection fraction was in the range of 30% to 35%. Diffuse hypokinesis.   Mitral Valve: Moderate to severe MR.  Aortic Valve: Moderate thickening and calcification with normal leaflet opening.   Tricuspid Valve: Mild to moderate TR.   Pulmonic Valve: Normal   Left Atrium/ Left atrial appendage: No thrombus, decreased velocities. Dilated left atrium.   Atrial septum: Aneurysmal, no PFO or ASD, negative bubble study.   Aorta: Mild plaque  Complications: No apparent complications Patient did tolerate procedure well.   Procedure: DC Cardioversion Indications: atrial flutter  Procedure Details Consent: Obtained Time Out: Verified patient identification, verified procedure, site/side was marked, verified correct patient position, special equipment/implants available, Radiology Safety Procedures followed,  medications/allergies/relevent history reviewed, required imaging and test results available.  Performed  The patient has been on adequate anticoagulation.  The patient received IV Propofol for sedation.  Synchronous cardioversion was performed at 120 joules.  The cardioversion was successful   Complications: No apparent complications Patient did tolerate procedure well.   Lars MassonNELSON, [place]  H, MD, [facility] 06/01/2014, 11:02 AM

## 2014-06-01 NOTE — Progress Notes (Signed)
     SUBJECTIVE: No chest pain or SOB.   Tele: appears to be flutter. Rate 65-75 bpm  BP 147/81  Pulse 82  Temp(Src) 97.6 F (36.4 C) (Oral)  Resp 20  Ht 5' 11" (1.803 m)  Wt 222 lb 14.2 oz (101.1 kg)  BMI 31.10 kg/m2  SpO2 98%  Intake/Output Summary (Last 24 hours) at 06/01/14 0628 Last data filed at 06/01/14 0042  Gross per 24 hour  Intake   1244 ml  Output    475 ml  Net    769 ml    PHYSICAL EXAM General: Well developed, well nourished, in no acute distress. Alert and oriented x 3.  Psych:  Good affect, responds appropriately Neck: No JVD. No masses noted.  Lungs: Clear bilaterally with no wheezes or rhonci noted.  Heart: RRR with no murmurs noted. Abdomen: Bowel sounds are present. Soft, non-tender.  Extremities: No lower extremity edema.   LABS: Basic Metabolic Panel:  Recent Labs  05/30/14 0336 05/31/14 0231  NA 138 139  K 4.1 4.2  CL 104 102  CO2 20 24  GLUCOSE 134* 121*  BUN 32* 29*  CREATININE 1.16 1.25  CALCIUM 8.5 8.9   CBC:  Recent Labs  05/29/14 1018 05/30/14 0336 05/31/14 0231  WBC  --  12.1* 13.2*  NEUTROABS  --  8.4*  --   HGB 17.0 13.8 14.8  HCT 50.0 43.9 46.0  MCV  --  92.8 91.8  PLT  --  241 255   Cardiac Enzymes:  Recent Labs  05/29/14 2243 05/30/14 0336 05/30/14 0953  TROPONINI <0.30 <0.30 <0.30   Current Meds: . apixaban  5 mg Oral BID  . aspirin EC  81 mg Oral Daily  . atorvastatin  20 mg Oral q1800  . diltiazem  240 mg Oral Daily  . furosemide  40 mg Oral BID  . lisinopril  5 mg Oral Daily  . metoprolol succinate  100 mg Oral Daily   ASSESSMENT AND PLAN:   1. Atrial flutter: Rate controlled on Diltiazem and Toprol but with readmission with RVR and CHF, will need TEE/DCCV to return to sinus and hopefully preventing readmission with CHF. Tele appears to show atrial flutter this am. Will get EKG to document flutter. TEE/DCCV today at 11am. Restarted apixiban 5 mg po BID on 05/31/14. He had been on heparin drip  while we were considering need for cholecystectomy and there was no period of time where he was not anti-coagulated.   2. Acute cholecystitis: No need for surgery or drain per surgery. Advance diet. Cipro 500 mg po BID for 7 day course.    3. Acute combined systolic and diastolic CHF: Volume status is much better. Continue po Lasix.   4. Hypertension: BP controlled.    Stanley Wells  10/29/20156:28 AM  

## 2014-06-01 NOTE — Transfer of Care (Signed)
Immediate Anesthesia Transfer of Care Note  Patient: Stanley Wells  Procedure(s) Performed: Procedure(s): TRANSESOPHAGEAL ECHOCARDIOGRAM (TEE) (N/A) CARDIOVERSION (N/A)  Patient Location: Endoscopy Unit  Anesthesia Type:MAC  Level of Consciousness: awake, alert  and oriented  Airway & Oxygen Therapy: Patient Spontanous Breathing and Patient connected to nasal cannula oxygen  Post-op Assessment: Report given to PACU RN and Post -op Vital signs reviewed and stable  Post vital signs: Reviewed and stable  Complications: No apparent anesthesia complications

## 2014-06-01 NOTE — Anesthesia Postprocedure Evaluation (Signed)
  Anesthesia Post-op Note  Patient: Stanley Wells  Procedure(s) Performed: Procedure(s): TRANSESOPHAGEAL ECHOCARDIOGRAM (TEE) (N/A) CARDIOVERSION (N/A)  Patient Location: Endoscopy Unit  Anesthesia Type:MAC  Level of Consciousness: awake, alert , oriented and patient cooperative  Airway and Oxygen Therapy: Patient Spontanous Breathing  Post-op Pain: none  Post-op Assessment: Post-op Vital signs reviewed, Patient's Cardiovascular Status Stable, Respiratory Function Stable, Patent Airway, No signs of Nausea or vomiting and Pain level controlled  Post-op Vital Signs: Reviewed and stable  Last Vitals:  Filed Vitals:   06/01/14 1210  BP: 106/77  Pulse: 71  Temp:   Resp: 23    Complications: No apparent anesthesia complications

## 2014-06-02 ENCOUNTER — Telehealth: Payer: Self-pay | Admitting: Nurse Practitioner

## 2014-06-02 ENCOUNTER — Encounter (HOSPITAL_COMMUNITY): Payer: Self-pay | Admitting: Cardiology

## 2014-06-02 DIAGNOSIS — I5042 Chronic combined systolic (congestive) and diastolic (congestive) heart failure: Secondary | ICD-10-CM | POA: Diagnosis present

## 2014-06-02 DIAGNOSIS — Z7901 Long term (current) use of anticoagulants: Secondary | ICD-10-CM

## 2014-06-02 DIAGNOSIS — I2510 Atherosclerotic heart disease of native coronary artery without angina pectoris: Secondary | ICD-10-CM | POA: Diagnosis present

## 2014-06-02 DIAGNOSIS — K819 Cholecystitis, unspecified: Secondary | ICD-10-CM | POA: Diagnosis present

## 2014-06-02 LAB — CBC
HEMATOCRIT: 42.2 % (ref 39.0–52.0)
HEMOGLOBIN: 13.2 g/dL (ref 13.0–17.0)
MCH: 28.8 pg (ref 26.0–34.0)
MCHC: 31.3 g/dL (ref 30.0–36.0)
MCV: 92.1 fL (ref 78.0–100.0)
Platelets: 237 10*3/uL (ref 150–400)
RBC: 4.58 MIL/uL (ref 4.22–5.81)
RDW: 15.4 % (ref 11.5–15.5)
WBC: 10.6 10*3/uL — ABNORMAL HIGH (ref 4.0–10.5)

## 2014-06-02 LAB — BASIC METABOLIC PANEL
Anion gap: 10 (ref 5–15)
BUN: 25 mg/dL — AB (ref 6–23)
CO2: 26 meq/L (ref 19–32)
CREATININE: 1.2 mg/dL (ref 0.50–1.35)
Calcium: 8.5 mg/dL (ref 8.4–10.5)
Chloride: 104 mEq/L (ref 96–112)
GFR calc Af Amer: 71 mL/min — ABNORMAL LOW (ref >90)
GFR calc non Af Amer: 61 mL/min — ABNORMAL LOW (ref >90)
GLUCOSE: 109 mg/dL — AB (ref 70–99)
Potassium: 4 mEq/L (ref 3.7–5.3)
SODIUM: 140 meq/L (ref 137–147)

## 2014-06-02 MED ORDER — CIPROFLOXACIN HCL 500 MG PO TABS: 500.0000 mg | ORAL_TABLET | Freq: Two times a day (BID) | ORAL | Status: DC

## 2014-06-02 MED ORDER — FUROSEMIDE 40 MG PO TABS: 40.0000 mg | ORAL_TABLET | Freq: Two times a day (BID) | ORAL | Status: DC

## 2014-06-02 MED ORDER — LISINOPRIL 5 MG PO TABS: 5.0000 mg | ORAL_TABLET | Freq: Every day | ORAL | Status: DC

## 2014-06-02 NOTE — Telephone Encounter (Signed)
New message      TCM appt on 06-09-14 per Katie.

## 2014-06-02 NOTE — Discharge Summary (Signed)
Discharge Summary   Patient ID: Stanley [name]: [id number], DOB/AGE: 67/04/1946 67 y.o. Admit date: 05/29/2014 D/C date:     06/02/2014  Primary Cardiologist: Dr. Tresa EndoKelly   Principal Problem:   Atrial flutter with rapid ventricular response Active Problems:   Paroxysmal atrial flutter   Chronic systolic CHF (congestive heart failure)   CAD (coronary artery disease)   Cholecystitis    Admission Dates: 05/29/14-06/02/14 Discharge Diagnosis: atrial flutter s/p TEE/DCCV c/b acute on chronic systolic CHF and acute cholescystiis  HPI: Stanley Wells is a 68 y.o. male with a history of recently diagnosed atrial flutter on Eliquis, CM (presumed 2/2 tachycardia), CAD s/p CABG 2003, and chronic systolic CHF who was admitted on 05/29/14 with atrial flutter with RVR and volume overload in the setting of possible acute cholecystitis.  He was recently hospitalized for atrial flutter and discharged home with a plan for outpatient cardioversion after 3-4 weeks of anticoagulation.  However, he presented to the ED with complaints of dyspnea at rest as well as abdominal discomfort. Evaluation in the emergency room showed atrial flutter with 2-1 AV block and a ventricular rate of 137 bpm as well as evidence of mildly abnormal transaminases, elevated WBC, normal alkaline phosphatase and bilirubin levels, possible mild thickening of the gallbladder wall on CT but without pericholecystic inflammation or other signs of acute cholecystitis. No gallstones were seen. On the ultrasound the gallbladder wall is 9 mm in thickness with sludge and at least one tiny calculus. The common bile duct measured only 3 mm in diameter.    Hospital Course  Atrial flutter: Now s/p TEE/DCCV. Staying in NSR. -- Eliquis initially held and placed on heparin gtt in the case that he need acute cholecystectomy -- When surgery cleared him from needing surgery/drain for gallbladder, a TEE/DCCV was scheduled which he underwent on  06/01/14. This was successful and he is currently in NSR -- Will continue Eliquis and Toprol XL 100mg . Diltiazem discontinued in the setting LV dysfunction. No ASA as he is on Eliquis  Acute cholecystitis: -- s/p HIDA scan on this admission. No need for surgery or drain per surgery.  -- Tolerating POs.  -- Continue Cipro 500 mg po BID for 7 day course. He has received 3 doses of Cipro, will continue for 5.5 more days. ( 11 more doses)  Acute on chronic systolic CHF:  -- 2D ECHO: 05/17/2014: EF 30-35%, mild concentric hypertrophy, diffuse hypokinesis, akinesis of the basalinferior myocardium, severe hypokinesis of the mid-apicalinferior myocardium. Aortic sclerosis, mild MR  -- Presumed to be tachycardia induced CM -- Volume status is much better after IV Lasix. Discharge weight 223 lbs  -- Continue po Lasix 40 bid. Lisinopril 5mg  added this admission.   Hypertension: BP controlled on lisinopril 5mg  and Toprol XL 100mg     The patient has had an uncomplicated hospital course and is recovering well. He has been seen by Dr. Swazil[place]Jordan today and deemed ready for discharge home. All follow-up appointments have been scheduled.  Discharge medications are listed below.   Discharge Vitals: Blood pressure 129/90, pulse 79, temperature 97.6 F (36.4 C), temperature source Oral, resp. rate 18, height 5\' 11"  (1.803 m), weight 223 lb 8.7 oz (101.4 kg), SpO2 99.00%.  Labs: Lab Results  Component Value Date   WBC 10.6* 06/02/2014   HGB 13.2 06/02/2014   HCT 42.2 06/02/2014   MCV 92.1 06/02/2014   PLT 237 06/02/2014    Recent Labs Lab 05/30/14 0336  06/02/14 0315  NA  138  < > 140  K 4.1  < > 4.0  CL 104  < > 104  CO2 20  < > 26  BUN 32*  < > 25*  CREATININE 1.16  < > 1.20  CALCIUM 8.5  < > 8.5  PROT 5.1*  --   --   BILITOT 0.7  --   --   ALKPHOS 57  --   --   ALT 66*  --   --   AST 27  --   --   GLUCOSE 134*  < > 109*  < > = values in this interval not displayed.  Lab Results  Component  Value Date   CHOL 135 05/16/2014   HDL 34* 05/16/2014   LDLCALC 71 05/16/2014   TRIG 148 05/16/2014     Diagnostic Studies/Procedures    Nm Hepatobiliary Including Gb  05/30/2014   CLINICAL DATA:  Abdominal pain with abnormal ultrasound concerning for potential cholecystitis  EXAM: NUCLEAR MEDICINE HEPATOBILIARY IMAGING WITH GALLBLADDER EF  Views:  Anterior, right lateral right upper quadrant  Radionuclide:  Technetium 56m Choletec  Dose:  5.0 mCi  Route of administration: Intravenous  COMPARISON:  Ultrasound right upper quadrant May 29, 2014  FINDINGS: Liver uptake of radiotracer is normal. There is prompt visualization of gallbladder and small bowel, indicating patency of the cystic and common bile ducts. A weight based dose, 2.01 mcg, of CCK was administered intravenously with calculation of the computer generated ejection fraction of radiotracer from the gallbladder. The patient did not experience symptoms with the CCK infusion. The computer generated ejection fraction of radiotracer from the gallbladder is normal at 96.3%, normal greater than 38%.  IMPRESSION: Study within normal limits.   Electronically Signed   By: William  Woodruff M.D.   On: 05/30/2014 16:05    Ct Abdomen Pelvis W Contrast  05/29/2014   CLINICAL DATA:  Left lower quadrant abdominal pain and swelling for 3 days.  EXAM: CT ABDOMEN AND PELVIS WITH CONTRAST  TECHNIQUE: Multidetector CT imaging of the abdomen and pelvis was performed using the standard protocol following bolus administration of intravenous contrast.  CONTRAST:  571ThresMCandiKentuckyce29.7220 Shad6303ThressZM2CandiKentuckyce29.590 Ketch9192ThressZoM7CandiKentuckyce29.7079 Eas4532ThressM2CandiKentuckyce29.74033270ThresM6Candi[place]ce29.7370 394ThressZoniM2Candi[place]entuckyce29.84683914ThressZoniLakM5CandiKentuckyce23208ThressZoM4CandiKentuckyce29.36571ThressMCandiKentuckyce29.70184539ThressZoniBinM3CandiKentuckyce29254ThressZoniLaMCandiKentuckyce29.7832 N. 6754ThressZoniOak RiArM5CandiKentuckyce29.952 S4719ThressM2CandiKentuckyce2633ThressZoniM5CandiKentuckyce29.905 South B6579ThressM6CandiK[place]uckyce29.58 East3116ThreM3CandiK[place]uckyce29.7832 N. 3329ThressZoniLa CrescentM7CandiKentuckyce29.375 W. India7516ThressZoniRayviMounM3CandiKentuckyce29.2588ThressZoniM7Candi[place]uckyce29.74766ThressZoniM6CandiK[place]uckyce29.9074 South C3558ThreM5CandiKentuckyce29.696 Green Lake AvenueergonXOL 300 MG/ML  SOLN  COMPARISON:  Chest CT 05/16/2014  FINDINGS: Lower chest: There are small bilateral pleural effusions with overlying atelectasis. No pericardial effusion. Three-vessel coronary artery calcifications are noted. The distal esophagus is grossly normal.  Hepatobiliary: Benign right hepatic lobe cyst. No worrisome hepatic lesions or intrahepatic biliary dilatation. A  calcified granuloma is noted in the lower aspect of the right hepatic lobe. The gallbladder wall may be slightly thickened. No obvious calcified gallstones or pericholecystic inflammatory change. Right upper quadrant ultrasound may be helpful for further evaluation. No common bile duct dilatation.  Pancreas: No focal hepatic lesions or evidence for acute pancreatitis.  Spleen: Normal size.  No focal lesions.  Adrenals/Urinary Tract: Left adrenal gland lesions are noted. These are likely benign adenomas. The right adrenal gland is normal. Both kidneys are unremarkable. No worrisome renal lesions or hydronephrosis. No obstructing ureteral calculi. The bladder appears normal.  Stomach/Bowel: The stomach, duodenum, small bowel and colon are unremarkable. No inflammatory changes, mass lesions or obstructive findings.  Vascular/Lymphatic: The aorta demonstrates Moderate to advanced atherosclerotic calcifications. No focal aneurysm or dissection. The branch vessels are patent. The major venous structures are patent.  Other: There is interstitial change am fluid in the retroperitoneum surrounding the kidneys and extending down into the extraperitoneal pelvis. I do not see an obvious explanation for  these findings. It could be the sequela of a cleared urinary tract infection and or pyelonephritis. I do not see any obstructing ureteral calculi or hydronephrosis.  Pelvis: The bladder, prostate glands and seminal vesicles are unremarkable. No pelvic mass or adenopathy. Extraperitoneal pelvic fluid is noted. No inguinal mass or adenopathy.  Musculoskeletal: No significant findings. Bilateral pars defects are noted at L5.  IMPRESSION: 1. Retroperitoneal interstitial change/fluid/edema of uncertain etiology. I do not see any definite findings for acute pyelonephritis or obstructive uropathy, pancreatitis or aortic abnormality. Retroperitoneal peritonitis would be a consideration. No abdominal/pelvic mass or adenopathy. 2. Left  adrenal gland lesions, likely benign adenomas. 3. Moderate to advanced atherosclerotic changes involving the aorta and branch vessels. 4. Possible inflammation of the gallbladder with suspected wall thickening. Right upper quadrant ultrasound may be helpful for further evaluation.   Electronically Signed   By: Loralie Champagne M.D.   On: 05/29/2014 13:39    Dg Chest Port 1 View  05/29/2014   CLINICAL DATA:  CHF, recently discharged from hospital, now with shortness of breath, abdominal swelling for 3 days, personal history of coronary artery disease post MI and CABG, former smoker  EXAM: PORTABLE CHEST - 1 VIEW  COMPARISON:  Portable exam 1013 hr compared to 05/16/2014  FINDINGS: Enlargement of cardiac silhouette post CABG.  Mediastinal contours and pulmonary vascularity normal.  Mild interstitial changes at the inferior lungs bilaterally could reflect minimal residual edema.  Improved pulmonary edema and decreased pleural effusions versus previous exam.  No segmental consolidation or pneumothorax.  Osseous structures unremarkable.  IMPRESSION: Improved CHF.   Electronically Signed   By: Ulyses Southward M.D.   On: 05/29/2014 10:24    US Abdomen Limited Ruq  05/29/2014   CLINICAL DATA:  Mid abdominal pain, initial evaluation  EXAM: US ABDOMEN LIMITED - RIGHT UPPER QUADRANT  COMPARISON:  05/29/2014 CT scan  FINDINGS: Gallbladder:  Gallbladder wall is 9 mm in thickness. There is sludge in the gallbladder. There is at least 1 tiny calculus. There is a positive sonographic Murphy's sign.  Common bile duct:  Diameter: 3 mm  Liver:  15 mm cyst in the right lobe, otherwise negative  Incidental note of a small right pleural effusion  IMPRESSION: Findings concerning for acute cholecystitis   Electronically Signed   By: Esperanza Heir M.D.   On: 05/29/2014 16:21    2D ECHO: 05/17/2014: LV EF: 30% - 35% Study Conclusions - Left ventricle: The cavity size was normal. There was mild concentric hypertrophy. Systolic  function was moderately to severely reduced. The estimated ejection fraction was in the range of 30% to 35%. Diffuse hypokinesis. There is akinesis of the basalinferior myocardium. There is severe hypokinesis of the mid-apicalinferior myocardium. - Aortic valve: Moderate thickening and calcification, consistent with sclerosis. - Mitral valve: There was mild regurgitation.   Discharge Medications     Medication List    STOP taking these medications       aspirin EC 81 MG tablet      TAKE these medications       apixaban 5 MG Tabs tablet  Commonly known as:  ELIQUIS  Take 1 tablet (5 mg total) by mouth 2 (two) times daily.     atorvastatin 20 MG tablet  Commonly known as:  LIPITOR  Take 1 tablet (20 mg total) by mouth daily at 6 PM.     ciprofloxacin 500 MG tablet  Commonly known as:  CIPRO  Take 1 tablet (500 mg total) by mouth  2 (two) times daily.     furosemide 40 MG tablet  Commonly known as:  LASIX  Take 1 tablet (40 mg total) by mouth 2 (two) times daily.     lisinopril 5 MG tablet  Commonly known as:  PRINIVIL,ZESTRIL  Take 1 tablet (5 mg total) by mouth daily.     metoprolol succinate 100 MG 24 hr tablet  Commonly known as:  TOPROL-XL  Take 1 tablet (100 mg total) by mouth daily.     multivitamin with minerals Tabs tablet  Take 1 tablet by mouth daily.        Disposition   The patient will be discharged in stable condition to home.  Follow-up Information   Follow up with Norma Fredrickson, NP On 06/09/2014. (@ 9:30am)    Specialty:  Nurse Practitioner   Contact information:   [address] [place] [address] [phone]         Duration of Discharge Encounter: Greater than 30 minutes including physician and PA time.  SignedCline Crock R PA-C 06/02/2014, 11:45 AM

## 2014-06-02 NOTE — Progress Notes (Signed)
     SUBJECTIVE: No chest pain or SOB.   Tele: NSR  BP 119/77  Pulse 80  Temp(Src) 97.6 F (36.4 C) (Oral)  Resp 18  Ht 5\' 11"  (1.803 m)  Wt 223 lb 8.7 oz (101.4 kg)  BMI 31.19 kg/m2  SpO2 99%  Intake/Output Summary (Last 24 hours) at 06/02/14 0953 Last data filed at 06/02/14 0842  Gross per 24 hour  Intake   1360 ml  Output    100 ml  Net   1260 ml    PHYSICAL EXAM General: Well developed, well nourished, in no acute distress. Alert and oriented x 3.  Psych:  Good affect, responds appropriately Neck: No JVD. No masses noted.  Lungs: Clear bilaterally with no wheezes or rhonci noted.  Heart: RRR with no murmurs noted. Abdomen: Bowel sounds are present. Soft, non-tender.  Extremities: No lower extremity edema.   LABS: Basic Metabolic Panel:  Recent Labs  09/81/1910/29/15 0600 06/02/14 0315  NA 139 140  K 4.0 4.0  CL 102 104  CO2 27 26  GLUCOSE 113* 109*  BUN 25* 25*  CREATININE 1.19 1.20  CALCIUM 8.6 8.5   CBC:  Recent Labs  05/31/14 0231 06/02/14 0315  WBC 13.2* 10.6*  HGB 14.8 13.2  HCT 46.0 42.2  MCV 91.8 92.1  PLT 255 237   Cardiac Enzymes: No results found for this basename: CKTOTAL, CKMB, CKMBINDEX, TROPONINI,  in the last 72 hours Current Meds: . apixaban  5 mg Oral BID  . atorvastatin  20 mg Oral q1800  . ciprofloxacin  500 mg Oral BID  . furosemide  40 mg Oral BID  . lisinopril  5 mg Oral Daily  . metoprolol succinate  100 mg Oral Daily   ASSESSMENT AND PLAN:   1. Atrial flutter: Now s/p TEE CV. Staying in NSR. Will continue Eliquis and metoprolol. Will stop diltiazem with lwo EF. Stop ASA on Eliquis. Stable for DC today.  2. Acute cholecystitis: No need for surgery or drain per surgery. Tolerating pos. Cipro 500 mg po BID for 7 day course.    3. Acute combined systolic and diastolic CHF: WF 14-78%30-35%. Volume status is much better. Continue po Lasix bid. lisinopril added this admission.  4. Hypertension: BP controlled.    Peter  Northwest Medical CenterJordanMD,FACC  10/30/20159:53 AM

## 2014-06-02 NOTE — Progress Notes (Signed)
D/C'd IV.  D/C'd tele.  D/C'd pt to home with wife.  Educated pt on living with CHF, when to weigh himself, to assess which zone he is in daily.  Also, stressed the importance of taking Eliquis BID, and not to miss any doses.  Pt was in no distress.

## 2014-06-02 NOTE — Discharge Summary (Signed)
Patient seen and examined and history reviewed. Agree with above findings and plan. See earlier rounding note.  Peter Jordan, MDFACC 06/02/2014 2:24 PM    

## 2014-06-05 NOTE — Telephone Encounter (Signed)
Attempted to call x 2 - phone is busy

## 2014-06-05 NOTE — Telephone Encounter (Signed)
Attempted to call again this eve. Phone is busy.

## 2014-06-09 ENCOUNTER — Encounter: Payer: Self-pay | Admitting: Nurse Practitioner

## 2014-06-09 ENCOUNTER — Telehealth (HOSPITAL_COMMUNITY): Payer: Self-pay

## 2014-06-09 ENCOUNTER — Ambulatory Visit (INDEPENDENT_AMBULATORY_CARE_PROVIDER_SITE_OTHER): Payer: Medicare Other | Admitting: Nurse Practitioner

## 2014-06-09 VITALS — BP 150/90 | HR 73 | Ht 71.0 in | Wt 205.6 lb

## 2014-06-09 DIAGNOSIS — Z7901 Long term (current) use of anticoagulants: Secondary | ICD-10-CM

## 2014-06-09 DIAGNOSIS — Z9889 Other specified postprocedural states: Secondary | ICD-10-CM | POA: Diagnosis not present

## 2014-06-09 DIAGNOSIS — I4892 Unspecified atrial flutter: Secondary | ICD-10-CM

## 2014-06-09 DIAGNOSIS — I25810 Atherosclerosis of coronary artery bypass graft(s) without angina pectoris: Secondary | ICD-10-CM

## 2014-06-09 DIAGNOSIS — Z79899 Other long term (current) drug therapy: Secondary | ICD-10-CM

## 2014-06-09 LAB — BASIC METABOLIC PANEL
BUN: 24 mg/dL — ABNORMAL HIGH (ref 6–23)
CO2: 26 mEq/L (ref 19–32)
Calcium: 9.3 mg/dL (ref 8.4–10.5)
Chloride: 103 mEq/L (ref 96–112)
Creatinine, Ser: 1 mg/dL (ref 0.4–1.5)
GFR: 75.52 mL/min (ref >60.00)
Glucose, Bld: 91 mg/dL (ref 70–99)
Potassium: 4.1 mEq/L (ref 3.5–5.1)
Sodium: 138 mEq/L (ref 135–145)

## 2014-06-09 MED ORDER — LISINOPRIL 5 MG PO TABS: 5.0000 mg | ORAL_TABLET | Freq: Two times a day (BID) | ORAL | Status: DC

## 2014-06-09 NOTE — Telephone Encounter (Signed)
Encounter complete. 

## 2014-06-09 NOTE — Patient Instructions (Addendum)
We will be checking the following labs today BMET  Stay on your current medicines but I am increasing the Lisinopril to 5 mg twice a day  See Dr. Tresa EndoKelly in a month  [facility]  Talk to your PCP about your gallbladder and a sleep study  Restrict your salt goal is less than 2000 mg each day  [facility] office at [phone] if you have any questions, problems or concerns.

## 2014-06-09 NOTE — Progress Notes (Signed)
Stanley Wells Date of Birth: 09/26/1945 Medical Record #[id number]  History of Present Illness: [name] is seen back today for a post hospital visit/TOC (but no phone call - phone line busy). Seen for Dr. Tresa Endo. He is a 68 year old male with paroxysmal atrial flutter, chronic systolic HF, CAD with remote CABG in 2003 and gallbladder disease.  Most recently diagnosed during an ER visit with atrial flutter - placed on Eliquis. Cardiomyopathy felt to be tachycardia mediated. Plan was for outpatient cardioversion after sufficient anticoagulation. However, had progressive dyspnea and abdominal pain and came back to the ER -  EKG with atrial flutter with 2:1 AV block and a VR of 137. Also had mildly elevated LFTs - with evaluation concerning for gallbladder disease - he underwent TEE/DCCV with conversion to NSR. He improved clinically - discharged on course of Cipro. No work up for his CAD noted.   Comes back today. Here with his wife. He is feeling good. He notes that he is back to his "normal self". No chest pain. Not short of breath. Feels good on his medicines. Not dizzy or lightheaded. Not checking his BP at home but has a cuff.  Has finished his antibiotics. No more belly pain.   Current Outpatient Prescriptions  Medication Sig Dispense Refill  . apixaban (ELIQUIS) 5 MG TABS tablet Take 1 tablet (5 mg total) by mouth 2 (two) times daily. 60 tablet 11  . atorvastatin (LIPITOR) 20 MG tablet Take 1 tablet (20 mg total) by mouth daily at 6 PM. 30 tablet 5  . furosemide (LASIX) 40 MG tablet Take 1 tablet (40 mg total) by mouth 2 (two) times daily. 60 tablet 5  . lisinopril (PRINIVIL,ZESTRIL) 5 MG tablet Take 1 tablet (5 mg total) by mouth 2 (two) times daily. 60 tablet 11  . metoprolol succinate (TOPROL-XL) 100 MG 24 hr tablet Take 1 tablet (100 mg total) by mouth daily. 30 tablet 5  . Multiple Vitamin (MULTIVITAMIN WITH MINERALS) TABS tablet Take 1 tablet by mouth daily.     No current  facility-administered medications for this visit.    No Known Allergies  Past Medical History  Diagnosis Date  . CAD (coronary artery disease)     a. s/p CABG 2003  . Paroxysmal atrial flutter     a. s/p succesful TEE/DCCV on 06/01/14.  b. on Eliquis  . Chronic systolic CHF (congestive heart failure)     a. 2D ECHO: 05/17/2014: EF 30-35%, mild concentric hypertrophy, diffuse hypokinesis, akinesis of the basalinferior myocardium, severe hypokinesis of the mid-apicalinferior myocardium. Aortic sclerosis, mild MR  . Cholecystitis   . HTN (hypertension)     Past Surgical History  Procedure Laterality Date  . Coronary artery bypass graft  2003    x 5  . Tee without cardioversion N/A 06/01/2014    Procedure: TRANSESOPHAGEAL ECHOCARDIOGRAM (TEE);  Surgeon: Lars Masson, MD;  Location: [facility] ENDOSCOPY;  Service: Cardiovascular;  Laterality: N/A;  . Cardioversion N/A 06/01/2014    Procedure: CARDIOVERSION;  Surgeon: Lars Masson, MD;  Location: [facility] ENDOSCOPY;  Service: Cardiovascular;  Laterality: N/A;    History  Smoking status  . Former Smoker -- 1.00 packs/day for 40 years  . Types: Cigarettes  . Quit date: 02/02/2002  Smokeless tobacco  . Never Used    History  Alcohol Use  . Yes    Comment: 6 pack of beer a week    Family History  Problem Relation Age of Onset  . Cancer  Mother   . Cancer Father     Review of Systems: The review of systems is per the HPI.  All other systems were reviewed and are negative.  Physical Exam: BP 150/90 mmHg  Pulse 73  Ht 5\' 11"  (1.803 m)  Wt 205 lb 9.6 oz (93.26 kg)  BMI 28.69 kg/m2 Patient is very pleasant and in no acute distress. Skin is warm and dry. Color is normal.  HEENT is unremarkable. Normocephalic/atraumatic. PERRL. Sclera are nonicteric. Neck is supple. No masses. No JVD. Lungs are clear. Cardiac exam shows a regular rate and rhythm. Occasional ectopic. Abdomen is soft. Extremities are without edema. Gait and ROM are  intact. No gross neurologic deficits noted.  Wt Readings from Last 3 Encounters:  06/09/14 205 lb 9.6 oz (93.26 kg)  06/02/14 223 lb 8.7 oz (101.4 kg)  05/17/14 223 lb 8.7 oz (101.4 kg)    LABORATORY DATA/PROCEDURES: BMET pending  EKG today with sinus with PVCs noted. Has Anterolateral T wave changes.   Lab Results  Component Value Date   WBC 10.6* 06/02/2014   HGB 13.2 06/02/2014   HCT 42.2 06/02/2014   PLT 237 06/02/2014   GLUCOSE 109* 06/02/2014   CHOL 135 05/16/2014   TRIG 148 05/16/2014   HDL 34* 05/16/2014   LDLCALC 71 05/16/2014   ALT 66* 05/30/2014   AST 27 05/30/2014   NA 140 06/02/2014   K 4.0 06/02/2014   CL 104 06/02/2014   CREATININE 1.20 06/02/2014   BUN 25* 06/02/2014   CO2 26 06/02/2014   TSH 1.830 05/16/2014   INR 1.12 05/16/2014   HGBA1C 6.4* 05/16/2014    BNP (last 3 results)  Recent Labs  05/16/14 1614 05/29/14 1007  PROBNP 2265.0* 3333.0*   Study Conclusions  - Left ventricle: The cavity size was mildly dilated. Systolic function was moderately to severely reduced. The estimated ejection fraction was in the range of 30% to 35%. Diffuse hypokinesis. No evidence of thrombus. - Aortic valve: Moderately thickened, moderately calcified leaflets. There was no stenosis. There was no regurgitation. - Aorta: Mild plaque. - Mitral valve: No evidence of vegetation. There was moderate to severe regurgitation with systolic reversal of flow in pulmonary veins. - Left atrium: The atrium was dilated. No evidence of thrombus in the atrial cavity or appendage. No evidence of thrombus in the atrial cavity or appendage. Emptying velocity was moderately reduced. - Right ventricle: Systolic function was mildly reduced. - Right atrium: No evidence of thrombus in the atrial cavity or appendage. - Tricuspid valve: There was mild-moderate regurgitation. - Pulmonic valve: No evidence of vegetation. - Pericardium, extracardiac: There was  no pericardial effusion.  Impressions:  - Successful cardioversion. No cardiac source of emboli was indentified.   Procedure: Transesophageal Echocardiogram Indications: atrial flutter  Procedure Details Consent: Obtained Time Out: Verified patient identification, verified procedure, site/side was marked, verified correct patient position, special equipment/implants available, Radiology Safety Procedures followed, medications/allergies/relevent history reviewed, required imaging and test results available. Performed  Medications: IV propofol by anesthesia staff  Left Ventrical: The cavity size was normal. There was mild concentric hypertrophy. Systolic function was moderately to severely reduced. The estimated ejection fraction was in the range of 30% to 35%. Diffuse hypokinesis.   Mitral Valve: Moderate to severe MR.  Aortic Valve: Moderate thickening and calcification with normal leaflet opening.   Tricuspid Valve: Mild to moderate TR.   Pulmonic Valve: Normal   Left Atrium/ Left atrial appendage: No thrombus, decreased velocities. Dilated left atrium.  Atrial septum: Aneurysmal, no PFO or ASD, negative bubble study.   Aorta: Mild plaque  Complications: No apparent complications Patient did tolerate procedure well.   Procedure: DC Cardioversion Indications: atrial flutter  Procedure Details Consent: Obtained Time Out: Verified patient identification, verified procedure, site/side was marked, verified correct patient position, special equipment/implants available, Radiology Safety Procedures followed, medications/allergies/relevent history reviewed, required imaging and test results available. Performed  The patient has been on adequate anticoagulation. The patient received IV Propofol for sedation. Synchronous cardioversion was performed at 120 joules.  The cardioversion was successful   Complications: No apparent complications Patient did  tolerate procedure well.   Lars MassonNELSON, KATARINA H, MD, [facility] 06/01/2014, 11:02 AM    Assessment / Plan:  1. Atrial flutter - s/p TEE/DCCV - holding in sinus.   2. Anticoagulation  - no problems with Eliquis noted.  3. Gallbladder disease - not felt to need intervention  4. CAD with remote CABG -  Abnormal EKG - needs Myoview  5. Systolic HF -  Would favor repeat echo 3 months post cardioversion to reassess his EF - hopefully with restoration of sinus rhythm his EF will improve - his Lisinopril is increased today to 5 mg BID.   6. HTN - recheck by me has improved to 130/80. ACE increased today. BMET today.   See Dr. Tresa EndoKelly in 4 weeks. [facility] [place].   Patient is agreeable to this plan and will call if any problems develop in the interim.   Rosalio MacadamiaLori C. Loree Shehata, RN, ANP-C [facility] [address] [place], [place]  [id number] [phone]

## 2014-06-13 ENCOUNTER — Ambulatory Visit (HOSPITAL_COMMUNITY)
Admission: RE | Admit: 2014-06-13 | Discharge: 2014-06-13 | Disposition: A | Discharge status: Home / Self Care | Payer: Medicare Other | Source: Ambulatory Visit | Attending: Cardiovascular Disease | Admitting: Cardiovascular Disease

## 2014-06-13 ENCOUNTER — Encounter (HOSPITAL_COMMUNITY): Payer: Medicare Other

## 2014-06-13 DIAGNOSIS — I4892 Unspecified atrial flutter: Secondary | ICD-10-CM | POA: Diagnosis not present

## 2014-06-13 DIAGNOSIS — I252 Old myocardial infarction: Secondary | ICD-10-CM | POA: Diagnosis not present

## 2014-06-13 DIAGNOSIS — I2510 Atherosclerotic heart disease of native coronary artery without angina pectoris: Secondary | ICD-10-CM | POA: Diagnosis not present

## 2014-06-13 DIAGNOSIS — Z79899 Other long term (current) drug therapy: Secondary | ICD-10-CM

## 2014-06-13 DIAGNOSIS — Z87891 Personal history of nicotine dependence: Secondary | ICD-10-CM | POA: Insufficient documentation

## 2014-06-13 DIAGNOSIS — I10 Essential (primary) hypertension: Secondary | ICD-10-CM | POA: Insufficient documentation

## 2014-06-13 DIAGNOSIS — Z7901 Long term (current) use of anticoagulants: Secondary | ICD-10-CM

## 2014-06-13 DIAGNOSIS — E785 Hyperlipidemia, unspecified: Secondary | ICD-10-CM | POA: Insufficient documentation

## 2014-06-13 DIAGNOSIS — E663 Overweight: Secondary | ICD-10-CM | POA: Insufficient documentation

## 2014-06-13 MED ORDER — REGADENOSON 0.4 MG/5ML IV SOLN
0.4000 mg | Freq: Once | INTRAVENOUS | Status: AC
  Administered 2014-06-13: 0.4 mg via INTRAVENOUS

## 2014-06-13 MED ORDER — TECHNETIUM TC 99M SESTAMIBI GENERIC - CARDIOLITE
10.0000 | Freq: Once | INTRAVENOUS | Status: AC | PRN
  Administered 2014-06-13: 10 via INTRAVENOUS

## 2014-06-13 MED ORDER — TECHNETIUM TC 99M SESTAMIBI GENERIC - CARDIOLITE
30.0000 | Freq: Once | INTRAVENOUS | Status: AC | PRN
  Administered 2014-06-13: 30 via INTRAVENOUS

## 2014-06-13 NOTE — Procedures (Addendum)
[facility] [address] [place] [address] [phone]  Cardiology Nuclear Med Study  Debby FreibergJames C Evora is a [age] y.o. male     MRN : [id number]     DOB: 12/12/1945  Procedure Date: 06/13/2014  Nuclear Med Background Indication for Stress Test:  Evaluation for Ischemia, Graft Patency and Post Hospital History:  CAD;MI-2003;CABG X5-2003;CHF;PAF w/TEE/DCC;RVR, 2:1 BLOCK;No prior NUC MPI for comparison;05/2014-TEE;EF=30-35% Cardiac Risk Factors: History of Smoking, Hypertension, Lipids and Overweight  Symptoms:  Pt denies symptoms at this time.   Nuclear Pre-Procedure Caffeine/Decaff Intake:  7:00pm NPO After: 5:00am   IV Site: R Forearm  IV 0.9% NS with Angio Cath:  22g  Chest Size (in):  48"  IV Started by: Berdie OgrenAmanda Wease, RN  Height: 5\' 11"  (1.803 m)  Cup Size: n/a  BMI:  Body mass index is 28.6 kg/(m^2). Weight:  205 lb (92.987 kg)   Tech Comments:  n/a    Nuclear Med Study 1 or 2 day study: 1 day  Stress Test Type:  Lexiscan  Order Authorizing Provider:  Norma FredricksonLori Gerhardt, NP   Resting Radionuclide: Technetium 2788m Sestamibi  Resting Radionuclide Dose: 10.2 mCi   Stress Radionuclide:  Technetium 3288m Sestamibi  Stress Radionuclide Dose: 30.8 mCi           Stress Protocol Rest HR: 64 Stress HR: 67  Rest BP: 111/84 Stress BP: 131/88  Exercise Time (min): n/a METS: n/a   Predicted Max HR: 153 bpm % Max HR: 57.52 bpm Rate Pressure Product: [id number]  Dose of Adenosine (mg):  n/a Dose of Lexiscan: 0.4 mg  Dose of Atropine (mg): n/a Dose of Dobutamine: n/a mcg/kg/min (at max HR)  Stress Test Technologist: Esperanza Sheetserry-Marie Martin, CCT Nuclear Technologist: Koren Shiverobin Moffitt, CNMT   Rest Procedure:  Myocardial perfusion imaging was performed at rest 45 minutes following the intravenous administration of Technetium 988m Sestamibi. Stress Procedure:  The patient received IV Lexiscan 0.4 mg over 15-seconds.  Technetium 5488m Sestamibi  injected IV at 30-seconds.  There were no significant changes with Lexiscan.  Quantitative spect images were obtained after a 45 minute delay.  Transient Ischemic Dilatation (Normal <1.22):  1.09 LV Ejection Fraction: Study not gated       Rest ECG: NSR with occasional PVC, septal MI, NSST  Stress ECG: No significant ST segment change suggestive of ischemia.  QPS Raw Data Images:  Acquisition technically good; LVE. Stress Images:  There is decreased uptake in the basal anterior wall, apex and inferior wall. Rest Images:  There is decreased uptake in the inferior wall and apex less prominent compared to the stress images. Subtraction (SDS):  These findings are consistent with prior inferior infarct and mild peri-infarct ischemia and moderate anterior ischemia.  Impression Exercise Capacity:  Lexiscan with no exercise. BP Response:  Normal blood pressure response. Clinical Symptoms:  There is dyspnea. ECG Impression:  No significant ST segment change suggestive of ischemia. Comparison with Prior Nuclear Study: No previous nuclear study performed  Overall Impression:  High risk stress nuclear study with a moderate size, severe intensity, partially reversible inferior defect consistent with infarct and mild peri-infarct ischemia; there is also a moderate size, severe intensity, reversible anterior defect consistent with moderate ischemia..  LV Wall Motion:  Study not gated.   Olga MillersBrian Kaien Pezzullo, MD  06/13/2014 10:51 AM

## 2014-06-15 ENCOUNTER — Encounter: Payer: Self-pay | Admitting: Cardiovascular Disease

## 2014-06-15 ENCOUNTER — Other Ambulatory Visit: Payer: Self-pay | Admitting: *Deleted

## 2014-06-15 ENCOUNTER — Ambulatory Visit (INDEPENDENT_AMBULATORY_CARE_PROVIDER_SITE_OTHER): Payer: Medicare Other | Admitting: Cardiovascular Disease

## 2014-06-15 VITALS — BP 120/80 | HR 64 | Ht 71.0 in | Wt 206.7 lb

## 2014-06-15 DIAGNOSIS — I25810 Atherosclerosis of coronary artery bypass graft(s) without angina pectoris: Secondary | ICD-10-CM

## 2014-06-15 DIAGNOSIS — Z79899 Other long term (current) drug therapy: Secondary | ICD-10-CM

## 2014-06-15 DIAGNOSIS — I4892 Unspecified atrial flutter: Secondary | ICD-10-CM

## 2014-06-15 DIAGNOSIS — R9439 Abnormal result of other cardiovascular function study: Secondary | ICD-10-CM

## 2014-06-15 DIAGNOSIS — Z01812 Encounter for preprocedural laboratory examination: Secondary | ICD-10-CM

## 2014-06-15 DIAGNOSIS — Z01818 Encounter for other preprocedural examination: Secondary | ICD-10-CM

## 2014-06-15 DIAGNOSIS — G4733 Obstructive sleep apnea (adult) (pediatric): Secondary | ICD-10-CM | POA: Diagnosis not present

## 2014-06-15 DIAGNOSIS — I2510 Atherosclerotic heart disease of native coronary artery without angina pectoris: Secondary | ICD-10-CM | POA: Diagnosis not present

## 2014-06-15 LAB — CBC
HCT: 50.1 % (ref 39.0–52.0)
HEMOGLOBIN: 16.3 g/dL (ref 13.0–17.0)
MCH: 28.9 pg (ref 26.0–34.0)
MCHC: 32.5 g/dL (ref 30.0–36.0)
MCV: 88.8 fL (ref 78.0–100.0)
Platelets: 279 10*3/uL (ref 150–400)
RBC: 5.64 MIL/uL (ref 4.22–5.81)
RDW: 14.4 % (ref 11.5–15.5)
WBC: 11 10*3/uL — ABNORMAL HIGH (ref 4.0–10.5)

## 2014-06-15 MED ORDER — ISOSORBIDE MONONITRATE ER 30 MG PO TB24: 30.0000 mg | ORAL_TABLET | Freq: Every day | ORAL | Status: DC

## 2014-06-15 NOTE — Progress Notes (Signed)
Patient ID: [name], male   DOB: 06/11/1946, 68 y.o.   MRN: [id number]     HPI: Stanley Wells is a 68 y.o. male who presents to the office today for cardiology evaluation following his recent hospitalization with atrial flutter.  Stanley Wells has a history of Carney artery disease and in 2003, underwent CABGx5 revascularization surgery by Dr. Owen.  Apparently, he states he has not seen a cardiologist since that time and has been followed in [place].  He has a history of continued tobacco use. In early October he started to develop increasing episodes of dyspnea on exertion.  Ultimately, he became aware that his heart rate was beating fast he saw his primary care physician who noted that he was in atrial fibrillation/flutter with a rapid ventricular response.  He presented to the emergency room on 05/16/2014 and was found to be in atrial flutter.  He was started on Eliquis anticoagulation.  An echo Doppler study showed an ejection fraction in the range of 30-35% with diffuse hypokinesis.  There was mild to moderate tricuspid regurgitation and moderately severe mitral regurgitation.  He underwent successful TEE guided DC cardioversion.  He was also felt to have gallbladder disease for which he underwent an abdominal CT as well as a hepatobiliary scan.  I have reviewed these results from his hospitalization.  He has noticed significantly more energy since restoration of sinus rhythm.  He was seen in follow-up by Stanley Wells, nurse practitioner and was maintaining sinus rhythm at that office visit.  He was referred for nuclear perfusion study which was done on 06/13/2014. This was interpreted as a high risk nuclear study with moderate size, severely intense, partially reversible inferior defect consistent with infarct and mild peri-infarct ischemia and in addition, there was a moderate size, severely intense, reversible anterior defect consistent with moderate anterior ischemia.  He denies any  chest pain.  However, he states he never really experienced chest pain prior to his CABG revascularization surgery.  Upon further questioning, in speaking with the wife, the patient does snore significantly.  She has experienced apparent  Apnea spells when her husband sleeps. .  He gets up several times per night for urination.  He presents now for cardiology evaluation.  Past Medical History  Diagnosis Date  . CAD (coronary artery disease)     a. s/p CABG 2003  . Paroxysmal atrial flutter     a. s/p succesful TEE/DCCV on 06/01/14.  b. on Eliquis  . Chronic systolic CHF (congestive heart failure)     a. 2D ECHO: 05/17/2014: EF 30-35%, mild concentric hypertrophy, diffuse hypokinesis, akinesis of the basalinferior myocardium, severe hypokinesis of the mid-apicalinferior myocardium. Aortic sclerosis, mild MR  . Cholecystitis   . HTN (hypertension)     Past Surgical History  Procedure Laterality Date  . Coronary artery bypass graft  2003    x 5  . Tee without cardioversion N/A 06/01/2014    Procedure: TRANSESOPHAGEAL ECHOCARDIOGRAM (TEE);  Surgeon: Stanley H Nelson, MD;  Location: [facility];  Service: Cardiovascular;  Laterality: N/A;  . Cardioversion N/A 06/01/2014    Procedure: CARDIOVERSION;  Surgeon: Stanley H Nelson, MD;  Location: [facility];  Service: Cardiovascular;  Laterality: N/A;    No Known Allergies  Current Outpatient Prescriptions  Medication Sig Dispense Refill  . apixaban (ELIQUIS) 5 MG TABS tablet Take 1 tablet (5 mg total) by mouth 2 (two) times daily. 60 tablet 11  . atorvastatin (LIPITOR) 20 MG tablet Take 1 tablet (20   mg total) by mouth daily at 6 PM. 30 tablet 5  . furosemide (LASIX) 40 MG tablet Take 1 tablet (40 mg total) by mouth 2 (two) times daily. 60 tablet 5  . isosorbide mononitrate (IMDUR) 30 MG 24 hr tablet Take 1 tablet (30 mg total) by mouth daily. 30 tablet 6  . lisinopril (PRINIVIL,ZESTRIL) 5 MG tablet Take 1 tablet (5 mg total) by mouth 2  (two) times daily. 60 tablet 11  . metoprolol succinate (TOPROL-XL) 100 MG 24 hr tablet Take 1 tablet (100 mg total) by mouth daily. 30 tablet 5  . Multiple Vitamin (MULTIVITAMIN WITH MINERALS) TABS tablet Take 1 tablet by mouth daily.     No current facility-administered medications for this visit.    History   Social History  . Marital Status: Married    Spouse Name: N/A    Number of Children: N/A  . Years of Education: N/A   Occupational History  . Retired    Social History Main Topics  . Smoking status: Former Smoker -- 1.00 packs/day for 40 years    Types: Cigarettes    Quit date: 02/02/2002  . Smokeless tobacco: Never Used  . Alcohol Use: Yes     Comment: 6 pack of beer a week  . Drug Use: No  . Sexual Activity: Not on file   Other Topics Concern  . Not on file   Social History Narrative   Lives in [place] with wife.    Family History  Problem Relation Age of Onset  . Cancer Mother   . Cancer Father     ROS General: Negative; No fevers, chills, or night sweats HEENT: Negative; No changes in vision or hearing, sinus congestion, difficulty swallowing Pulmonary: Negative; No cough, wheezing, shortness of breath, hemoptysis Cardiovascular: See HPI: No chest pain, presyncope, syncope, palpatations GI:  No nausea, vomiting, diarrhea, or abdominal pain since his hospitalization GU: Negative; No dysuria, hematuria, or difficulty voiding Musculoskeletal: Negative; no myalgias, joint pain, or weakness Hematologic: Negative; no easy bruising, bleeding Endocrine: Negative; no heat/cold intolerance; no diabetes, Neuro: Negative; no changes in balance, headaches Skin: Negative; No rashes or skin lesions Psychiatric: Negative; No behavioral problems, depression Sleep: positive for snoring and witnessed apnea;  No daytime sleepiness, hypersomnolence, bruxism, restless legs, hypnogognic hallucinations. Other comprehensive 14 point system review is negative   Physical  Exam BP 120/80 mmHg  Pulse 64  Ht 5' 11" (1.803 m)  Wt 206 lb 11.2 oz (93.759 kg)  BMI 28.84 kg/m2 General: Alert, oriented, no distress.  Skin: normal turgor, no rashes, warm and dry HEENT: Normocephalic, atraumatic. Pupils equal round and reactive to light; sclera anicteric; extraocular muscles intact, No lid lag; Nose without nasal septal hypertrophy; Mouth/Parynx benign; Mallinpatti scale 3 Neck: No JVD, no carotid bruits; normal carotid upstroke Lungs: clear to ausculatation and percussion bilaterally; no wheezing or rales, normal inspiratory and expiratory effort Chest wall: without tenderness to palpitation Heart: PMI not displaced, RRR, s1 s2 normal, 2/6 systolic murmur, No diastolic murmur, no rubs, gallops, thrills, or heaves Abdomen: soft, nontender; no hepatosplenomehaly, BS+; abdominal aorta nontender and not dilated by palpation. Back: no CVA tenderness Pulses: 2+  Musculoskeletal: full range of motion, normal strength, no joint deformities Extremities: Pulses 2+, no clubbing cyanosis or edema, Homan's sign negative  Neurologic: grossly nonfocal; Cranial nerves grossly wnl Psychologic: Normal mood and affect  Nuclear study: I personally have reviewed the nuclear scan images which suggest moderate size scar with partial ischemia inferiorly and more significant   anterior to anterolateral ischemia.  I also have reviewed these in detail with the patient and his wife and have shown themthe scanned images and the areas of concern on the image scan  LABS:  BMET    Component Value Date/Time   NA 139 06/15/2014 1345   K 4.7 06/15/2014 1345   CL 103 06/15/2014 1345   CO2 24 06/15/2014 1345   GLUCOSE 105* 06/15/2014 1345   BUN 25* 06/15/2014 1345   CREATININE 1.10 06/15/2014 1345   CREATININE 1.0 06/09/2014 0957   CALCIUM 9.7 06/15/2014 1345   GFRNONAA 61* 06/02/2014 0315   GFRAA 71* 06/02/2014 0315     Hepatic Function Panel     Component Value Date/Time   PROT 6.7  06/15/2014 1345   ALBUMIN 4.2 06/15/2014 1345   AST 33 06/15/2014 1345   ALT 53 06/15/2014 1345   ALKPHOS 75 06/15/2014 1345   BILITOT 0.9 06/15/2014 1345   BILIDIR 0.2 05/29/2014 1416   IBILI 0.6 05/29/2014 1416     CBC    Component Value Date/Time   WBC 11.0* 06/15/2014 1345   RBC 5.64 06/15/2014 1345   HGB 16.3 06/15/2014 1345   HCT 50.1 06/15/2014 1345   PLT 279 06/15/2014 1345   MCV 88.8 06/15/2014 1345   MCH 28.9 06/15/2014 1345   MCHC 32.5 06/15/2014 1345   RDW 14.4 06/15/2014 1345   LYMPHSABS 2.5 05/30/2014 0336   MONOABS 1.0 05/30/2014 0336   EOSABS 0.2 05/30/2014 0336   BASOSABS 0.1 05/30/2014 0336     BNP    Component Value Date/Time   PROBNP 3333.0* 05/29/2014 1007    Lipid Panel     Component Value Date/Time   CHOL 166 06/15/2014 1345   TRIG 169* 06/15/2014 1345   HDL 51 06/15/2014 1345   CHOLHDL 3.3 06/15/2014 1345   VLDL 34 06/15/2014 1345   LDLCALC 81 06/15/2014 1345     RADIOLOGY: Nm Hepatobiliary Including Gb  05/30/2014   CLINICAL DATA:  Abdominal pain with abnormal ultrasound concerning for potential cholecystitis  EXAM: NUCLEAR MEDICINE HEPATOBILIARY IMAGING WITH GALLBLADDER EF  Views:  Anterior, right lateral right upper quadrant  Radionuclide:  Technetium 99m Choletec  Dose:  5.0 mCi  Route of administration: Intravenous  COMPARISON:  Ultrasound right upper quadrant May 29, 2014  FINDINGS: Liver uptake of radiotracer is normal. There is prompt visualization of gallbladder and small bowel, indicating patency of the cystic and common bile ducts. A weight based dose, 2.01 mcg, of CCK was administered intravenously with calculation of the computer generated ejection fraction of radiotracer from the gallbladder. The patient did not experience symptoms with the CCK infusion. The computer generated ejection fraction of radiotracer from the gallbladder is normal at 96.3%, normal greater than 38%.  IMPRESSION: Study within normal limits.    Electronically Signed   By: Stanley  Wells M.D.   On: 05/30/2014 16:05   Ct Abdomen Pelvis W Contrast  05/29/2014   CLINICAL DATA:  Left lower quadrant abdominal pain and swelling for 3 days.  EXAM: CT ABDOMEN AND PELVIS WITH CONTRAST  TECHNIQUE: Multidetector CT imaging of the abdomen and pelvis was performed using the standard protocol following bolus administration of intravenous contrast.  CONTRAST:  80mL OMNIPAQUE IOHEXOL 300 MG/ML  SOLN  COMPARISON:  Chest CT 05/16/2014  FINDINGS: Lower chest: There are small bilateral pleural effusions with overlying atelectasis. No pericardial effusion. Three-vessel coronary artery calcifications are noted. The distal esophagus is grossly normal.  Hepatobiliary: Benign right hepatic lobe   cyst. No worrisome hepatic lesions or intrahepatic biliary dilatation. A calcified granuloma is noted in the lower aspect of the right hepatic lobe. The gallbladder wall may be slightly thickened. No obvious calcified gallstones or pericholecystic inflammatory change. Right upper quadrant ultrasound may be helpful for further evaluation. No common bile duct dilatation.  Pancreas: No focal hepatic lesions or evidence for acute pancreatitis.  Spleen: Normal size.  No focal lesions.  Adrenals/Urinary Tract: Left adrenal gland lesions are noted. These are likely benign adenomas. The right adrenal gland is normal. Both kidneys are unremarkable. No worrisome renal lesions or hydronephrosis. No obstructing ureteral calculi. The bladder appears normal.  Stomach/Bowel: The stomach, duodenum, small bowel and colon are unremarkable. No inflammatory changes, mass lesions or obstructive findings.  Vascular/Lymphatic: The aorta demonstrates Moderate to advanced atherosclerotic calcifications. No focal aneurysm or dissection. The branch vessels are patent. The major venous structures are patent.  Other: There is interstitial change am fluid in the retroperitoneum surrounding the kidneys and  extending down into the extraperitoneal pelvis. I do not see an obvious explanation for these findings. It could be the sequela of a cleared urinary tract infection and or pyelonephritis. I do not see any obstructing ureteral calculi or hydronephrosis.  Pelvis: The bladder, prostate glands and seminal vesicles are unremarkable. No pelvic mass or adenopathy. Extraperitoneal pelvic fluid is noted. No inguinal mass or adenopathy.  Musculoskeletal: No significant findings. Bilateral pars defects are noted at L5.  IMPRESSION: 1. Retroperitoneal interstitial change/fluid/edema of uncertain etiology. I do not see any definite findings for acute pyelonephritis or obstructive uropathy, pancreatitis or aortic abnormality. Retroperitoneal peritonitis would be a consideration. No abdominal/pelvic mass or adenopathy. 2. Left adrenal gland lesions, likely benign adenomas. 3. Moderate to advanced atherosclerotic changes involving the aorta and branch vessels. 4. Possible inflammation of the gallbladder with suspected wall thickening. Right upper quadrant ultrasound may be helpful for further evaluation.   Electronically Signed   By: Stanley  Wells M.D.   On: 05/29/2014 13:39   Dg Chest Port 1 View  05/29/2014   CLINICAL DATA:  CHF, recently discharged from hospital, now with shortness of breath, abdominal swelling for 3 days, personal history of coronary artery disease post MI and CABG, former smoker  EXAM: PORTABLE CHEST - 1 VIEW  COMPARISON:  Portable exam 1013 hr compared to 05/16/2014  FINDINGS: Enlargement of cardiac silhouette post CABG.  Mediastinal contours and pulmonary vascularity normal.  Mild interstitial changes at the inferior lungs bilaterally could reflect minimal residual edema.  Improved pulmonary edema and decreased pleural effusions versus previous exam.  No segmental consolidation or pneumothorax.  Osseous structures unremarkable.  IMPRESSION: Improved CHF.   Electronically Signed   By: Stanley  Wells M.D.    On: 05/29/2014 10:24   Us Abdomen Limited Ruq  05/29/2014   CLINICAL DATA:  Mid abdominal pain, initial evaluation  EXAM: US ABDOMEN LIMITED - RIGHT UPPER QUADRANT  COMPARISON:  05/29/2014 CT scan  FINDINGS: Gallbladder:  Gallbladder wall is 9 mm in thickness. There is sludge in the gallbladder. There is at least 1 tiny calculus. There is a positive sonographic Murphy's sign.  Common bile duct:  Diameter: 3 mm  Liver:  15 mm cyst in the right lobe, otherwise negative  Incidental note of a small right pleural effusion  IMPRESSION: Findings concerning for acute cholecystitis   Electronically Signed   By: Stanley  Wells M.D.   On: 05/29/2014 16:21      ASSESSMENT AND PLAN: [name] is a   68-year-old Caucasian male who is 12 years status post CBG surgery 5 by Dr. Owen.  He had only seen a cardiologist 1 time after his surgery but had not had Cardiologic follow-up in over 12 years.  He is now one month after presenting to the hospital with atrial fibrillation flutter with increased ventricular rate and was found to have a significant cardiomyopathy with an ejection fraction of 30-35% with diffuse hypokinesis.  He has been on anticoagulation with Eliquis.  Since his cardioversion he is maintaining sinus rhythm.  Since he had a nuclear study 2 days ago, a repeat ECG was not done in the office today, but I did review the ECG from the treadmill test.  Presently, his blood pressure is controlled on lisinopril 5 mg twice a day, Toprol 100 mg, and he also is taking Lasix 40 mg twice a day.  His lungs are clear.  There are no signs of CHF.  His nuclear study is high risk.  I reviewed the study in detail.  It suggests abnormalities in at least 2 vascular distributions.  Presently, I am m recommending further titration of his Toprol to 125 mg and am also initiating isosorbide mononitrate 30 mg.  I'm also concerned that he may have obstructive sleep apnea and I'm referring him for an polysomnogram for further  evaluation.  In light of his nuclear study findings,  I had a long discussion with he and his wife today regarding definitive cardiac catheterization.  I discussed the risks benefits of the procedure in detail.  In light of his reduced LV function and valvular heart disease I will schedule this to be done as a right and left heart catheterization.  He will need to hold Eliquis for 48 hours prior to the cardiac catheterization.  I answered all he and his wife's questions.  Catheterization will be scheduled at [facility] next week. Also reviewed his recent laboratory from the hospital.  He is on atorvastatin 20 mg daily for hyperlipidemia.  Target LDL is less than 70.  I will also recommend further increase of his atorvastatin to 40 mg daily following his catheterization.   Time spent: 45 minutes  Stanley Rachel A. Caldwell Kronenberger, MD, FACC  06/17/2014 11:36 AM    

## 2014-06-15 NOTE — Patient Instructions (Signed)
Your physician has recommended that you have a sleep study. This test records several body functions during sleep, including: brain activity, eye movement, oxygen and carbon dioxide blood levels, heart rate and rhythm, breathing rate and rhythm, the flow of air through your mouth and nose, snoring, body muscle movements, and chest and belly movement.  Your physician has requested that you have a cardiac catheterization. Cardiac catheterization is used to diagnose and/or treat various heart conditions. Doctors may recommend this procedure for a number of different reasons. The most common reason is to evaluate chest pain. Chest pain can be a symptom of coronary artery disease (CAD), and cardiac catheterization can show whether plaque is narrowing or blocking your heart's arteries. This procedure is also used to evaluate the valves, as well as measure the blood flow and oxygen levels in different parts of your heart. For further information please visit [email or web address] Please follow instruction sheet, as given.  Your physician recommends that you return for lab work within 7 days of the procedure.  Your physician has recommended you make the following change in your medication: increase the metoprolol succ to 125 mg daily ( 1 & 1/4 tablet)  HOLD the Eliquis 48 hours prior to the heart cath.  Start new prescription for isosorbide [place] 30 mg. This has already been sent to your pharmacy.  Your physician recommends that you schedule a follow-up appointment in: 2 weeks after the procedure. This will be with a NP or PA and will be scheduled at the time of your discharge from the hospital.

## 2014-06-16 LAB — COMPREHENSIVE METABOLIC PANEL
ALK PHOS: 75 U/L (ref 39–117)
ALT: 53 U/L (ref 0–53)
AST: 33 U/L (ref 0–37)
Albumin: 4.2 g/dL (ref 3.5–5.2)
BILIRUBIN TOTAL: 0.9 mg/dL (ref 0.2–1.2)
BUN: 25 mg/dL — ABNORMAL HIGH (ref 6–23)
CO2: 24 meq/L (ref 19–32)
CREATININE: 1.1 mg/dL (ref 0.50–1.35)
Calcium: 9.7 mg/dL (ref 8.4–10.5)
Chloride: 103 mEq/L (ref 96–112)
GLUCOSE: 105 mg/dL — AB (ref 70–99)
Potassium: 4.7 mEq/L (ref 3.5–5.3)
Sodium: 139 mEq/L (ref 135–145)
Total Protein: 6.7 g/dL (ref 6.0–8.3)

## 2014-06-16 LAB — PROTIME-INR
INR: 1.14 (ref <1.50)
PROTHROMBIN TIME: 14.6 s (ref 11.6–15.2)

## 2014-06-16 LAB — LIPID PANEL
Cholesterol: 166 mg/dL (ref 0–200)
HDL: 51 mg/dL (ref >39)
LDL CALC: 81 mg/dL (ref 0–99)
Total CHOL/HDL Ratio: 3.3 Ratio
Triglycerides: 169 mg/dL — ABNORMAL HIGH (ref <150)
VLDL: 34 mg/dL (ref 0–40)

## 2014-06-16 LAB — TSH: TSH: 2.255 u[IU]/mL (ref 0.350–4.500)

## 2014-06-16 LAB — APTT: aPTT: 33 seconds (ref 24–37)

## 2014-06-17 ENCOUNTER — Encounter: Payer: Self-pay | Admitting: Cardiovascular Disease

## 2014-06-19 ENCOUNTER — Encounter (HOSPITAL_COMMUNITY)
Admission: RE | Disposition: A | Discharge status: Home / Self Care | Payer: Self-pay | Source: Ambulatory Visit | Attending: Cardiovascular Disease

## 2014-06-19 ENCOUNTER — Encounter: Payer: Self-pay | Admitting: *Deleted

## 2014-06-19 ENCOUNTER — Ambulatory Visit (HOSPITAL_COMMUNITY)
Admission: RE | Admit: 2014-06-19 | Discharge: 2014-06-19 | Disposition: A | Discharge status: Home / Self Care | Payer: Medicare Other | Source: Ambulatory Visit | Attending: Cardiovascular Disease | Admitting: Cardiovascular Disease

## 2014-06-19 DIAGNOSIS — I4891 Unspecified atrial fibrillation: Secondary | ICD-10-CM | POA: Diagnosis not present

## 2014-06-19 DIAGNOSIS — I255 Ischemic cardiomyopathy: Secondary | ICD-10-CM | POA: Insufficient documentation

## 2014-06-19 DIAGNOSIS — I5022 Chronic systolic (congestive) heart failure: Secondary | ICD-10-CM

## 2014-06-19 DIAGNOSIS — I10 Essential (primary) hypertension: Secondary | ICD-10-CM | POA: Insufficient documentation

## 2014-06-19 DIAGNOSIS — I2510 Atherosclerotic heart disease of native coronary artery without angina pectoris: Secondary | ICD-10-CM | POA: Diagnosis not present

## 2014-06-19 DIAGNOSIS — E785 Hyperlipidemia, unspecified: Secondary | ICD-10-CM | POA: Diagnosis not present

## 2014-06-19 DIAGNOSIS — I2582 Chronic total occlusion of coronary artery: Secondary | ICD-10-CM | POA: Diagnosis not present

## 2014-06-19 DIAGNOSIS — I4892 Unspecified atrial flutter: Secondary | ICD-10-CM | POA: Insufficient documentation

## 2014-06-19 DIAGNOSIS — R931 Abnormal findings on diagnostic imaging of heart and coronary circulation: Secondary | ICD-10-CM | POA: Diagnosis present

## 2014-06-19 DIAGNOSIS — Z01818 Encounter for other preprocedural examination: Secondary | ICD-10-CM

## 2014-06-19 DIAGNOSIS — I340 Nonrheumatic mitral (valve) insufficiency: Secondary | ICD-10-CM | POA: Diagnosis not present

## 2014-06-19 DIAGNOSIS — Z87891 Personal history of nicotine dependence: Secondary | ICD-10-CM | POA: Diagnosis not present

## 2014-06-19 DIAGNOSIS — I25810 Atherosclerosis of coronary artery bypass graft(s) without angina pectoris: Secondary | ICD-10-CM | POA: Diagnosis not present

## 2014-06-19 HISTORY — PX: CARDIAC CATHETERIZATION: SHX172

## 2014-06-19 LAB — POCT I-STAT 3, ART BLOOD GAS (G3+)
Acid-base deficit: 2 mmol/L (ref 0.0–2.0)
Bicarbonate: 23.3 mEq/L (ref 20.0–24.0)
O2 SAT: 99 %
TCO2: 24 mmol/L (ref 0–100)
pCO2 arterial: 38.9 mmHg (ref 35.0–45.0)
pH, Arterial: 7.385 (ref 7.350–7.450)
pO2, Arterial: 118 mmHg — ABNORMAL HIGH (ref 80.0–100.0)

## 2014-06-19 SURGERY — RIGHT/LEFT HEART CATH AND CORONARY/GRAFT ANGIOGRAPHY

## 2014-06-19 MED ORDER — ASPIRIN EC 81 MG PO TBEC: 81.0000 mg | DELAYED_RELEASE_TABLET | Freq: Every day | ORAL | Status: DC

## 2014-06-19 MED ORDER — ACETAMINOPHEN 325 MG PO TABS: 650.0000 mg | ORAL_TABLET | ORAL | Status: DC | PRN

## 2014-06-19 MED ORDER — SODIUM CHLORIDE 0.9 % IJ SOLN: 3.0000 mL | Freq: Two times a day (BID) | INTRAMUSCULAR | Status: DC

## 2014-06-19 MED ORDER — MIDAZOLAM HCL 2 MG/2ML IJ SOLN
INTRAMUSCULAR | Status: AC
  Filled 2014-06-19: qty 2

## 2014-06-19 MED ORDER — LIDOCAINE HCL (PF) 1 % IJ SOLN
INTRAMUSCULAR | Status: AC
  Filled 2014-06-19: qty 30

## 2014-06-19 MED ORDER — ATORVASTATIN CALCIUM 80 MG PO TABS: 80.0000 mg | ORAL_TABLET | Freq: Every day | ORAL | Status: DC

## 2014-06-19 MED ORDER — SODIUM CHLORIDE 0.9 % IJ SOLN: 3.0000 mL | INTRAMUSCULAR | Status: DC | PRN

## 2014-06-19 MED ORDER — SODIUM CHLORIDE 0.9 % IV SOLN: 250.0000 mL | INTRAVENOUS | Status: DC | PRN

## 2014-06-19 MED ORDER — FENTANYL CITRATE 0.05 MG/ML IJ SOLN
INTRAMUSCULAR | Status: AC
  Filled 2014-06-19: qty 2

## 2014-06-19 MED ORDER — SODIUM CHLORIDE 0.9 % IV SOLN: INTRAVENOUS | Status: DC

## 2014-06-19 MED ORDER — ONDANSETRON HCL 4 MG/2ML IJ SOLN: 4.0000 mg | Freq: Four times a day (QID) | INTRAMUSCULAR | Status: DC | PRN

## 2014-06-19 MED ORDER — HEPARIN (PORCINE) IN NACL 2-0.9 UNIT/ML-% IJ SOLN
INTRAMUSCULAR | Status: AC
  Filled 2014-06-19: qty 1500

## 2014-06-19 MED ORDER — ASPIRIN 81 MG PO CHEW
81.0000 mg | CHEWABLE_TABLET | ORAL | Status: AC
  Administered 2014-06-19: 81 mg via ORAL

## 2014-06-19 MED ORDER — DEXTROSE-NACL 5-0.45 % IV SOLN
INTRAVENOUS | Status: DC
  Administered 2014-06-19: 08:00:00 via INTRAVENOUS

## 2014-06-19 MED ORDER — ASPIRIN 81 MG PO CHEW
CHEWABLE_TABLET | ORAL | Status: AC
  Filled 2014-06-19: qty 1

## 2014-06-19 NOTE — H&P (View-Only) (Signed)
Patient ID: Stanley Wells Jakes, male   DOB: 11/15/1945, 68 y.o.   MRN: [id number]     HPI: Stanley Wells Manfre is a 68 y.o. male who presents to the office today for cardiology evaluation following his recent hospitalization with atrial flutter.  [name] has a history of Carney artery disease and in 2003, underwent CABGx5 revascularization surgery by Dr. Cornelius Moraswen.  Apparently, he states he has not seen a cardiologist since that time and has been [place] in [place].  He has a history of continued tobacco use. In early October he started to develop increasing episodes of dyspnea on exertion.  Ultimately, he became aware that his heart rate was beating fast he saw his primary care physician who noted that he was in atrial fibrillation/flutter with a rapid ventricular response.  He presented to the emergency room on 05/16/2014 and was found to be in atrial flutter.  He was started on Eliquis anticoagulation.  An echo Doppler study showed an ejection fraction in the range of 30-35% with diffuse hypokinesis.  There was mild to moderate tricuspid regurgitation and moderately severe mitral regurgitation.  He underwent successful TEE guided DC cardioversion.  He was also felt to have gallbladder disease for which he underwent an abdominal CT as well as a hepatobiliary scan.  I have reviewed these results from his hospitalization.  He has noticed significantly more energy since restoration of sinus rhythm.  He was seen in follow-up by Otelia SanteeLaurie Gearhart, nurse practitioner and was maintaining sinus rhythm at that office visit.  He was referred for nuclear perfusion study which was done on 06/13/2014. This was interpreted as a high risk nuclear study with moderate size, severely intense, partially reversible inferior defect consistent with infarct and mild peri-infarct ischemia and in addition, there was a moderate size, severely intense, reversible anterior defect consistent with moderate anterior ischemia.  He denies any  chest pain.  However, he states he never really experienced chest pain prior to his CABG revascularization surgery.  Upon further questioning, in speaking with the wife, the patient does snore significantly.  She has experienced apparent  Apnea spells when her husband sleeps. .  He gets up several times per night for urination.  He presents now for cardiology evaluation.  Past Medical History  Diagnosis Date  . CAD (coronary artery disease)     a. s/p CABG 2003  . Paroxysmal atrial flutter     a. s/p succesful TEE/DCCV on 06/01/14.  b. on Eliquis  . Chronic systolic CHF (congestive heart failure)     a. 2D ECHO: 05/17/2014: EF 30-35%, mild concentric hypertrophy, diffuse hypokinesis, akinesis of the basalinferior myocardium, severe hypokinesis of the mid-apicalinferior myocardium. Aortic sclerosis, mild MR  . Cholecystitis   . HTN (hypertension)     Past Surgical History  Procedure Laterality Date  . Coronary artery bypass graft  2003    x 5  . Tee without cardioversion N/A 06/01/2014    Procedure: TRANSESOPHAGEAL ECHOCARDIOGRAM (TEE);  Surgeon: Lars MassonKatarina H Nelson, MD;  [facility];  Service: Cardiovascular;  Laterality: N/A;  . Cardioversion N/A 06/01/2014    Procedure: CARDIOVERSION;  Surgeon: Lars MassonKatarina H Nelson, MD;  [facility];  Service: Cardiovascular;  Laterality: N/A;    No Known Allergies  Current Outpatient Prescriptions  Medication Sig Dispense Refill  . apixaban (ELIQUIS) 5 MG TABS tablet Take 1 tablet (5 mg total) by mouth 2 (two) times daily. 60 tablet 11  . atorvastatin (LIPITOR) 20 MG tablet Take 1 tablet (20  mg total) by mouth daily at 6 PM. 30 tablet 5  . furosemide (LASIX) 40 MG tablet Take 1 tablet (40 mg total) by mouth 2 (two) times daily. 60 tablet 5  . isosorbide mononitrate (IMDUR) 30 MG 24 hr tablet Take 1 tablet (30 mg total) by mouth daily. 30 tablet 6  . lisinopril (PRINIVIL,ZESTRIL) 5 MG tablet Take 1 tablet (5 mg total) by mouth 2  (two) times daily. 60 tablet 11  . metoprolol succinate (TOPROL-XL) 100 MG 24 hr tablet Take 1 tablet (100 mg total) by mouth daily. 30 tablet 5  . Multiple Vitamin (MULTIVITAMIN WITH MINERALS) TABS tablet Take 1 tablet by mouth daily.     No current facility-administered medications for this visit.    History   Social History  . Marital Status: Married    Spouse Name: N/A    Number of Children: N/A  . Years of Education: N/A   Occupational History  . Retired    Social History Main Topics  . Smoking status: Former Smoker -- 1.00 packs/day for 40 years    Types: Cigarettes    Quit date: 02/02/2002  . Smokeless tobacco: Never Used  . Alcohol Use: Yes     Comment: 6 pack of beer a week  . Drug Use: No  . Sexual Activity: Not on file   Other Topics Concern  . Not on file   Social History Narrative   Lives in [place] with wife.    Family History  Problem Relation Age of Onset  . Cancer Mother   . Cancer Father     ROS General: Negative; No fevers, chills, or night sweats HEENT: Negative; No changes in vision or hearing, sinus congestion, difficulty swallowing Pulmonary: Negative; No cough, wheezing, shortness of breath, hemoptysis Cardiovascular: See HPI: No chest pain, presyncope, syncope, palpatations GI:  No nausea, vomiting, diarrhea, or abdominal pain since his hospitalization GU: Negative; No dysuria, hematuria, or difficulty voiding Musculoskeletal: Negative; no myalgias, joint pain, or weakness Hematologic: Negative; no easy bruising, bleeding Endocrine: Negative; no heat/cold intolerance; no diabetes, Neuro: Negative; no changes in balance, headaches Skin: Negative; No rashes or skin lesions Psychiatric: Negative; No behavioral problems, depression Sleep: positive for snoring and witnessed apnea;  No daytime sleepiness, hypersomnolence, bruxism, restless legs, hypnogognic hallucinations. Other comprehensive 14 point system review is negative   Physical  Exam BP 120/80 mmHg  Pulse 64  Ht 5\' 11"  (1.803 m)  Wt 206 lb 11.2 oz (93.759 kg)  BMI 28.84 kg/m2 General: Alert, oriented, no distress.  Skin: normal turgor, no rashes, warm and dry HEENT: Normocephalic, atraumatic. Pupils equal round and reactive to light; sclera anicteric; extraocular muscles intact, No lid lag; Nose without nasal septal hypertrophy; Mouth/Parynx benign; Mallinpatti scale 3 Neck: No JVD, no carotid bruits; normal carotid upstroke Lungs: clear to ausculatation and percussion bilaterally; no wheezing or rales, normal inspiratory and expiratory effort Chest wall: without tenderness to palpitation Heart: PMI not displaced, RRR, s1 s2 normal, 2/6 systolic murmur, No diastolic murmur, no rubs, gallops, thrills, or heaves Abdomen: soft, nontender; no hepatosplenomehaly, BS+; abdominal aorta nontender and not dilated by palpation. Back: no CVA tenderness Pulses: 2+  Musculoskeletal: full range of motion, normal strength, no joint deformities Extremities: Pulses 2+, no clubbing cyanosis or edema, Homan's sign negative  Neurologic: grossly nonfocal; Cranial nerves grossly wnl Psychologic: Normal mood and affect  Nuclear study: I personally have reviewed the nuclear scan images which suggest moderate size scar with partial ischemia inferiorly and more significant  anterior to anterolateral ischemia.  I also have reviewed these in detail with the patient and his wife and have shown themthe scanned images and the areas of concern on the image scan  LABS:  BMET    Component Value Date/Time   NA 139 06/15/2014 1345   K 4.7 06/15/2014 1345   CL 103 06/15/2014 1345   CO2 24 06/15/2014 1345   GLUCOSE 105* 06/15/2014 1345   BUN 25* 06/15/2014 1345   CREATININE 1.10 06/15/2014 1345   CREATININE 1.0 06/09/2014 0957   CALCIUM 9.7 06/15/2014 1345   GFRNONAA 61* 06/02/2014 0315   GFRAA 71* 06/02/2014 0315     Hepatic Function Panel     Component Value Date/Time   PROT 6.7  06/15/2014 1345   ALBUMIN 4.2 06/15/2014 1345   AST 33 06/15/2014 1345   ALT 53 06/15/2014 1345   ALKPHOS 75 06/15/2014 1345   BILITOT 0.9 06/15/2014 1345   BILIDIR 0.2 05/29/2014 1416   IBILI 0.6 05/29/2014 1416     CBC    Component Value Date/Time   WBC 11.0* 06/15/2014 1345   RBC 5.64 06/15/2014 1345   HGB 16.3 06/15/2014 1345   HCT 50.1 06/15/2014 1345   PLT 279 06/15/2014 1345   MCV 88.8 06/15/2014 1345   MCH 28.9 06/15/2014 1345   MCHC 32.5 06/15/2014 1345   RDW 14.4 06/15/2014 1345   LYMPHSABS 2.5 05/30/2014 0336   MONOABS 1.0 05/30/2014 0336   EOSABS 0.2 05/30/2014 0336   BASOSABS 0.1 05/30/2014 0336     BNP    Component Value Date/Time   PROBNP 3333.0* 05/29/2014 1007    Lipid Panel     Component Value Date/Time   CHOL 166 06/15/2014 1345   TRIG 169* 06/15/2014 1345   HDL 51 06/15/2014 1345   CHOLHDL 3.3 06/15/2014 1345   VLDL 34 06/15/2014 1345   LDLCALC 81 06/15/2014 1345     RADIOLOGY: Nm Hepatobiliary Including Gb  05/30/2014   CLINICAL DATA:  Abdominal pain with abnormal ultrasound concerning for potential cholecystitis  EXAM: NUCLEAR MEDICINE HEPATOBILIARY IMAGING WITH GALLBLADDER EF  Views:  Anterior, right lateral right upper quadrant  Radionuclide:  Technetium 54m Choletec  Dose:  5.0 mCi  Route of administration: Intravenous  COMPARISON:  Ultrasound right upper quadrant May 29, 2014  FINDINGS: Liver uptake of radiotracer is normal. There is prompt visualization of gallbladder and small bowel, indicating patency of the cystic and common bile ducts. A weight based dose, 2.01 mcg, of CCK was administered intravenously with calculation of the computer generated ejection fraction of radiotracer from the gallbladder. The patient did not experience symptoms with the CCK infusion. The computer generated ejection fraction of radiotracer from the gallbladder is normal at 96.3%, normal greater than 38%.  IMPRESSION: Study within normal limits.    Electronically Signed   By: Bretta Bang M.D.   On: 05/30/2014 16:05   Ct Abdomen Pelvis W Contrast  05/29/2014   CLINICAL DATA:  Left lower quadrant abdominal pain and swelling for 3 days.  EXAM: CT ABDOMEN AND PELVIS WITH CONTRAST  TECHNIQUE: Multidetector CT imaging of the abdomen and pelvis was performed using the standard protocol following bolus administration of intravenous contrast.  CONTRAST:  80mL OMNIPAQUE IOHEXOL 300 MG/ML  SOLN  COMPARISON:  Chest CT 05/16/2014  FINDINGS: Lower chest: There are small bilateral pleural effusions with overlying atelectasis. No pericardial effusion. Three-vessel coronary artery calcifications are noted. The distal esophagus is grossly normal.  Hepatobiliary: Benign right hepatic lobe  cyst. No worrisome hepatic lesions or intrahepatic biliary dilatation. A calcified granuloma is noted in the lower aspect of the right hepatic lobe. The gallbladder wall may be slightly thickened. No obvious calcified gallstones or pericholecystic inflammatory change. Right upper quadrant ultrasound may be helpful for further evaluation. No common bile duct dilatation.  Pancreas: No focal hepatic lesions or evidence for acute pancreatitis.  Spleen: Normal size.  No focal lesions.  Adrenals/Urinary Tract: Left adrenal gland lesions are noted. These are likely benign adenomas. The right adrenal gland is normal. Both kidneys are unremarkable. No worrisome renal lesions or hydronephrosis. No obstructing ureteral calculi. The bladder appears normal.  Stomach/Bowel: The stomach, duodenum, small bowel and colon are unremarkable. No inflammatory changes, mass lesions or obstructive findings.  Vascular/Lymphatic: The aorta demonstrates Moderate to advanced atherosclerotic calcifications. No focal aneurysm or dissection. The branch vessels are patent. The major venous structures are patent.  Other: There is interstitial change am fluid in the retroperitoneum surrounding the kidneys and  extending down into the extraperitoneal pelvis. I do not see an obvious explanation for these findings. It could be the sequela of a cleared urinary tract infection and or pyelonephritis. I do not see any obstructing ureteral calculi or hydronephrosis.  Pelvis: The bladder, prostate glands and seminal vesicles are unremarkable. No pelvic mass or adenopathy. Extraperitoneal pelvic fluid is noted. No inguinal mass or adenopathy.  Musculoskeletal: No significant findings. Bilateral pars defects are noted at L5.  IMPRESSION: 1. Retroperitoneal interstitial change/fluid/edema of uncertain etiology. I do not see any definite findings for acute pyelonephritis or obstructive uropathy, pancreatitis or aortic abnormality. Retroperitoneal peritonitis would be a consideration. No abdominal/pelvic mass or adenopathy. 2. Left adrenal gland lesions, likely benign adenomas. 3. Moderate to advanced atherosclerotic changes involving the aorta and branch vessels. 4. Possible inflammation of the gallbladder with suspected wall thickening. Right upper quadrant ultrasound may be helpful for further evaluation.   Electronically Signed   By: Loralie ChampagneMark  Gallerani M.D.   On: 05/29/2014 13:39   Dg Chest Port 1 View  05/29/2014   CLINICAL DATA:  CHF, recently discharged from hospital, now with shortness of breath, abdominal swelling for 3 days, personal history of coronary artery disease post MI and CABG, former smoker  EXAM: PORTABLE CHEST - 1 VIEW  COMPARISON:  Portable exam 1013 hr compared to 05/16/2014  FINDINGS: Enlargement of cardiac silhouette post CABG.  Mediastinal contours and pulmonary vascularity normal.  Mild interstitial changes at the inferior lungs bilaterally could reflect minimal residual edema.  Improved pulmonary edema and decreased pleural effusions versus previous exam.  No segmental consolidation or pneumothorax.  Osseous structures unremarkable.  IMPRESSION: Improved CHF.   Electronically Signed   By: Ulyses SouthwardMark  Boles M.D.    On: 05/29/2014 10:24   [place] Abdomen Limited Ruq  05/29/2014   CLINICAL DATA:  Mid abdominal pain, initial evaluation  EXAM: US ABDOMEN LIMITED - RIGHT UPPER QUADRANT  COMPARISON:  05/29/2014 CT scan  FINDINGS: Gallbladder:  Gallbladder wall is 9 mm in thickness. There is sludge in the gallbladder. There is at least 1 tiny calculus. There is a positive sonographic Murphy's sign.  Common bile duct:  Diameter: 3 mm  Liver:  15 mm cyst in the right lobe, otherwise negative  Incidental note of a small right pleural effusion  IMPRESSION: Findings concerning for acute cholecystitis   Electronically Signed   By: Esperanza Heiraymond  Rubner M.D.   On: 05/29/2014 16:21      ASSESSMENT AND PLAN: Mr. Cathlean CowerJames Festa is a  68 year old Caucasian male who is 12 years status post CBG surgery 5 by Dr. [name].  He had only seen a cardiologist 1 time after his surgery but had not had Cardiologic follow-up in over 12 years.  He is now one month after presenting to the hospital with atrial fibrillation flutter with increased ventricular rate and was found to have a significant cardiomyopathy with an ejection fraction of 30-35% with diffuse hypokinesis.  He has been on anticoagulation with Eliquis.  Since his cardioversion he is maintaining sinus rhythm.  Since he had a nuclear study 2 days ago, a repeat ECG was not done in the office today, but I did review the ECG from the treadmill test.  Presently, his blood pressure is controlled on lisinopril 5 mg twice a day, Toprol 100 mg, and he also is taking Lasix 40 mg twice a day.  His lungs are clear.  There are no signs of CHF.  His nuclear study is high risk.  I reviewed the study in detail.  It suggests abnormalities in at least 2 vascular distributions.  Presently, I am m recommending further titration of his Toprol to 125 mg and am also initiating isosorbide mononitrate 30 mg.  I'm also concerned that he may have obstructive sleep apnea and I'm referring him for an polysomnogram for further  evaluation.  In light of his nuclear study findings,  I had a long discussion with he and his wife today regarding definitive cardiac catheterization.  I discussed the risks benefits of the procedure in detail.  In light of his reduced LV function and valvular heart disease I will schedule this to be done as a right and left heart catheterization.  He will need to hold Eliquis for 48 hours prior to the cardiac catheterization.  I answered all he and his wife's questions.  Catheterization will be scheduled at [facility] next week. Also reviewed his recent laboratory from the hospital.  He is on atorvastatin 20 mg daily for hyperlipidemia.  Target LDL is less than 70.  I will also recommend further increase of his atorvastatin to 40 mg daily following his catheterization.   Time spent: 45 minutes  Lennette Bihari, MD, [facility]  06/17/2014 11:36 AM

## 2014-06-19 NOTE — Progress Notes (Signed)
Site area: rt groin Site Prior to Removal:  Level 0 Pressure Applied For: 20 minutes Manual:   yes Patient Status During Pull:  stable Post Pull Site:  Level 0 Post Pull Instructions Given:  yes Post Pull Pulses Present: yes Dressing Applied:  tegaderm Bedrest begins @ 1050 Comments: no complications

## 2014-06-19 NOTE — Interval H&P Note (Signed)
Cath Lab Visit (complete for each Cath Lab visit)  Clinical Evaluation Leading to the Procedure:   ACS: No.  Non-ACS:    Anginal Classification: CCS II  Anti-ischemic medical therapy: Maximal Therapy (2 or more classes of medications)  Non-Invasive Test Results: High-risk stress test findings: cardiac mortality >3%/year  Prior CABG: No previous CABG      History and Physical Interval Note:  06/19/2014 9:08 AM  Stanley Wells  has presented today for surgery, with the diagnosis of Chest Pain  The various methods of treatment have been discussed with the patient and family. After consideration of risks, benefits and other options for treatment, the patient has consented to  Procedure(s): LEFT HEART CATHETERIZATION WITH CORONARY/GRAFT ANGIOGRAM (Left) as a surgical intervention .  The patient's history has been reviewed, patient examined, no change in status, stable for surgery.  I have reviewed the patient's chart and labs.  Questions were answered to the patient's satisfaction.     Aniayah Alaniz A

## 2014-06-19 NOTE — Discharge Instructions (Signed)
Angiogram, Care After °Refer to this sheet in the next few weeks. These instructions provide you with information on caring for yourself after your procedure. Your health care provider may also give you more specific instructions. Your treatment has been planned according to current medical practices, but problems sometimes occur. Call your health care provider if you have any problems or questions after your procedure.  °WHAT TO EXPECT AFTER THE PROCEDURE °After your procedure, it is typical to have the following sensations: °· Minor discomfort or tenderness and a small bump at the catheter insertion site. The bump should usually decrease in size and tenderness within 1 to 2 weeks. °· Any bruising will usually fade within 2 to 4 weeks. °HOME CARE INSTRUCTIONS  °· You may need to keep taking blood thinners if they were prescribed for you. Take medicines only as directed by your health care provider. °· Do not apply powder or lotion to the site. °· Do not take baths, swim, or use a hot tub until your health care provider approves. °· You may shower 24 hours after the procedure. Remove the bandage (dressing) and gently wash the site with plain soap and water. Gently pat the site dry. °· Inspect the site at least twice daily. °· Limit your activity for the first 48 hours. Do not bend, squat, or lift anything over 20 lb (9 kg) or as directed by your health care provider. °· Plan to have someone take you home after the procedure. Follow instructions about when you can drive or return to work. °SEEK MEDICAL CARE IF: °· You get light-headed when standing up. °· You have drainage (other than a small amount of blood on the dressing). °· You have chills. °· You have a fever. °· You have redness, warmth, swelling, or pain at the insertion site. °SEEK IMMEDIATE MEDICAL CARE IF:  °· You develop chest pain or shortness of breath, feel faint, or pass out. °· You have bleeding, swelling larger than a walnut, or drainage from the  catheter insertion site. °· You develop pain, discoloration, coldness, or severe bruising in the leg or arm that held the catheter.. °· You have heavy bleeding from the site. If this happens, hold pressure on the site. °MAKE SURE YOU: °· Understand these instructions. °· Will watch your condition. °· Will get help right away if you are not doing well or get worse. °Document Released: 02/06/2005 Document Revised: 12/05/2013 Document Reviewed: 12/13/2012 °[employer]® Patient Information ©2015 [employer]. This information is not intended to replace advice given to you by your health care provider. Make sure you discuss any questions you have with your health care provider. ° °

## 2014-06-19 NOTE — CV Procedure (Addendum)
Stanley Wells is a 68 y.o. male   [id number]  [id number] LOCATION:  FACILITY: [facility]  PHYSICIAN: Lennette Biharihomas A. Makari Sanko, MD, [facility] 03/22/1946   DATE OF PROCEDURE:  06/19/2014       RIGHT AND LEFT HEART CARDIAC CATHETERIZATION   HISTORY:   Stanley Wells is a 68 y.o. male who underwent CABG revascularization surgery 5 in 2003, by Dr. Cornelius Moraswen with a LIMA to the LAD, saphenous vein graft to the OM 2, saphenous vein graft to a diagonal 1, and sequential  vein graft to the PDA and PLA branches of the RCA. He had not had Cardiologic follow-up in over 12 years.  He recently developed atrial flutter and underwent successful cardioversion.  He was found to have an ejection fraction of 30-35% and a TEE suggested moderately severe MR and at least moderate TR.  A nuclear perfusion study was high risk with several regions of ischemia.  He is now referred for definitive right and left heart cardiac catheterization.   PROCEDURE:  Right and left heart catheterization: Swan-Ganz catheterization with cardiac output determination by the thermodilution and Fick methods, coronary angiography into the native coronary arteries, selective angiography into the saphenous vein grafts and left internal mammary artery, left ventriculography.  The patient was brought to the second floor [facility] lab in the fasting state. Versed 2 mg and fentanyl 50 mcg were administered for conscious sedation. The right groin was prepped and draped in sterile fashion and a 7 French arterial and venous sheaths were inserted without difficulty. A Swan-Ganz catheter was advanced into the venous sheath and pressures were obtained in the right atrium, right ventricle, pulmonary artery, and pulmonary capillary wedge position. Cardiac outputs were obtained by the thermodilution and assumed Fick methods. Oxygen saturation was obtained in the pulmonary artery and aorta. A pigtail catheter was inserted and simultaneous AO/PA pressures were  recorded. The pigtail catheter was advanced into the left ventricle and simultaneous left ventricular and PCW pressures were recorded. Left ventriculography was performed in the RAO projection.  A left ventricle to aorta pullback was performed. The pigtail catheter was then removed and diagnostic catheterization to delineate the coronary anatomy was performed utilizing 5 French JL 5,  JR$ and LIMA diagnostic catheters. All catheters were removed and the patient. Hemostasis was obtained by direct manual pressure. The patient tolerated the procedure well and returned to his room in satisfactory condition.   HEMODYNAMICS:   RA: 2 RV: 20/2 PA: 20/6 PC: mean 5 a 8  V 5 LV: 120/11  AO: 120/65  Oxygen saturation in the aorta 99% and the pulmonary artery 69%  Cardiac output: 4.0 l/min (Thermo); 4.2 (Fick)  Cardiac index: 1.9 l/m/m2                2.0  ANGIOGRAPHY:   Left main:  A  long angiographically normal vessel which gave rise to an intermediate and left circumflex.  Next cardio artery.  The LAD was occluded at its origin from the left main  LAD:  Totally occluded at its origin from the left main  Ramus intermediate:  Normal small vessel.   Left circumflex:  Moderate size vessel that had 70% proximal stenosis.  The marginal branches were not visualized from the left injection.  There was collateralization to the distal RCA.  The the distal circumflex.   Right coronary artery:  Totally occluded at its origin.  SVG to the OM 2: This graft was a large graft which had mild luminal  irregularities proximally in its midsegment.  The graft anastomosis into the OM2.  There was filling of the AV groove circumflex as well as the OM1 vessel via this graft.  There was no distal disease beyond the graft anastomosis in the OM 2 vessel.  Saphenous vein graft to the diagonal vessel is totally occluded at its origin.  Saphenous vein graft which had been a sequential graft supplying the PDA and PLA vessel is  totally occluded at its origin.  The LIMA graft was a large caliber graft which was widely patent.  The LAD filled retrograde back up to the left main.  The LAD distal to the graft was free of significant disease.  There were collateral supplying the diagonal vessel as well as collateral supplying an acute marginal branch of the RCA with moderate filling of the mid RCA via this collateral system  Left ventriculography  Revealed a enlarged left ventricle with global hypokinesis and an ejection fraction of 35%.  Only trivial mitral regurgitation was appreciated.  Total contrast used: 95 cc Omnipaque  IMPRESSION:  Ischemic cardiomyopathy with an ejection fraction of 35% and diffuse hypo-contractility and trivial mitral regurgitation.  Severe native coronary obstructive disease with total occlusion of the LAD at at its origin; 70% proximal left circumflex stenosis; and total occlusion of the proximal RCA.  Patent LIMA graft supplying the LAD with retrograde filling up to the left main and distal filling with collateralization to both the diagonal vessel as well as to the mid RCA.  Patent saphenous vein graft with mild luminal irregularities supplying the obtuse marginal 2 vessel of the left circumflex artery with filling of the AV groove circumflex and the OM1 vessel via this graft.  Occluded vein graft which had supplied the diagonal 1 vessel.  Occluded vein graft,which had sequentially supplied the PDA and PLA branches of the RCA.  RECOMMENDATION:  Medical therapy for the patient's CAD and graft occlusion with beta blocker, Ace-IARB therapy, nitrates, and possible ranolazine.  The patient will resume Eliquis tomorrow at 5 mg twice a day in light of his recent paroxysmal atrial flutter and his ischemic cardiomyopathy.   Lennette Biharihomas A. Achilles Neville, MD, [facility] 06/19/2014 10:10 AM

## 2014-06-20 LAB — POCT I-STAT 3, VENOUS BLOOD GAS (G3P V)
Bicarbonate: 25.8 mEq/L — ABNORMAL HIGH (ref 20.0–24.0)
O2 Saturation: 69 %
TCO2: 27 mmol/L (ref 0–100)
pCO2, Ven: 45.5 mmHg (ref 45.0–50.0)
pH, Ven: 7.361 — ABNORMAL HIGH (ref 7.250–7.300)
pO2, Ven: 38 mmHg (ref 30.0–45.0)

## 2014-06-26 ENCOUNTER — Other Ambulatory Visit: Payer: Self-pay

## 2014-06-26 MED ORDER — ATORVASTATIN CALCIUM 80 MG PO TABS: 80.0000 mg | ORAL_TABLET | Freq: Every day | ORAL | Status: DC

## 2014-06-26 NOTE — Telephone Encounter (Signed)
Rx was sent to pharmacy electronically. 

## 2014-07-05 ENCOUNTER — Encounter: Payer: Self-pay | Admitting: Cardiovascular Disease

## 2014-07-05 ENCOUNTER — Ambulatory Visit (INDEPENDENT_AMBULATORY_CARE_PROVIDER_SITE_OTHER): Payer: Medicare Other | Admitting: Cardiovascular Disease

## 2014-07-05 VITALS — BP 120/80 | HR 61 | Ht 71.0 in | Wt 209.0 lb

## 2014-07-05 DIAGNOSIS — I4892 Unspecified atrial flutter: Secondary | ICD-10-CM | POA: Diagnosis not present

## 2014-07-05 DIAGNOSIS — I2583 Coronary atherosclerosis due to lipid rich plaque: Secondary | ICD-10-CM

## 2014-07-05 DIAGNOSIS — G4733 Obstructive sleep apnea (adult) (pediatric): Secondary | ICD-10-CM | POA: Diagnosis not present

## 2014-07-05 DIAGNOSIS — I255 Ischemic cardiomyopathy: Secondary | ICD-10-CM

## 2014-07-05 DIAGNOSIS — Z79899 Other long term (current) drug therapy: Secondary | ICD-10-CM

## 2014-07-05 DIAGNOSIS — I2510 Atherosclerotic heart disease of native coronary artery without angina pectoris: Secondary | ICD-10-CM | POA: Diagnosis not present

## 2014-07-05 MED ORDER — RANOLAZINE ER 500 MG PO TB12: 500.0000 mg | ORAL_TABLET | Freq: Two times a day (BID) | ORAL | Status: DC

## 2014-07-05 MED ORDER — ATORVASTATIN CALCIUM 40 MG PO TABS: 40.0000 mg | ORAL_TABLET | Freq: Every day | ORAL | Status: DC

## 2014-07-05 NOTE — Patient Instructions (Signed)
Your physician has recommended you make the following change in your medication: start new prescription for Ranexa 500 mg as directed on the bottle. This has already been sent to your pharmacy. Decrease the atorvastatin down to 40 mg daily. Take 1/2 tablet of the 80 mg tablets daily. When completed start new prescription given today fore the 40 mg tablets.  Your physician recommends that you return for lab work fasting in 3 months.  Your physician recommends that you schedule a follow-up appointment in: 3 months with Dr. Tresa EndoKelly

## 2014-07-05 NOTE — Progress Notes (Signed)
Patient ID: Stanley Wells, male   DOB: October 20, 1945, 68 y.o.   MRN: [id number]     HPI: Stanley Wells is a 68 y.o. male who presents to the office today for cardiology evaluation following his recent cardiac catheterization.  [name] has a history of CAD and in 2003, underwent CABGx5 revascularization surgery by Dr. [name].  He had not seen a cardiologist since that time and has been followed in [place].  He has a history of continued tobacco use. In early October2015 he started to develop increasing episodes of dyspnea on exertion.  Ultimately, he became aware that his heart rate was beating fast he saw his primary care physician who noted that he was in atrial fibrillation/flutter with a rapid ventricular response.  He presented to the emergency room on 05/16/2014 and was found to be in atrial flutter.  He was started on Eliquis anticoagulation.  An echo Doppler study showed an ejection fraction in the range of 30-35% with diffuse hypokinesis.  There was mild to moderate tricuspid regurgitation and moderately severe mitral regurgitation.  He underwent successful TEE guided DC cardioversion.  He was also felt to have gallbladder disease for which he underwent an abdominal CT as well as a hepatobiliary scan.  I have reviewed these results from his hospitalization.  He has noticed significantly more energy since restoration of sinus rhythm.  He was seen in follow-up by Otelia Santee, nurse practitioner and was maintaining sinus rhythm at that office visit.  He was referred for nuclear perfusion study which was done on 06/13/2014. This was interpreted as a high risk nuclear study with moderate size, severely intense, partially reversible inferior defect consistent with infarct and mild peri-infarct ischemia and in addition, there was a moderate size, severely intense, reversible anterior defect consistent with moderate anterior ischemia.  He denies any chest pain.  However, he states he never really  experienced chest pain prior to his CABG revascularization surgery.  Upon further questioning, in speaking with the wife, the patient does snore significantly.  She has experienced apparent  Apnea spells when her husband sleeps. .  He gets up several times per night for urination.  He presents now for cardiology evaluation.  Since I initially saw him, he underwent right and left heart cardiac catheterization after his nuclear study was high risk with several regions of ischemia.  I perform this on 06/19/2014.  Right are pressures were normal and not increased.  Catheterization revealed findings compatible with an ischemic cardiopathy with an ejection fraction of 35% and diffuse hypocontractility and trivial mitral regurgitation.  He had severe native CAD with total occlusion of the LAD at [facility], 70% proximal left circumflex stenosis, and total occlusion of the proximal RCA.  The LIMA graft supplying the LAD was widely patent and there was retrograde filling of the LAD up to the left main and distal filling with collateralization to both the diagonal vessel as well as to the mid RCA.  The vein graft supplying the obtuse marginal 2 vessel had mild luminal irregularities but was otherwise free of significant stenosis and there was filling of the AV groove circumflex and the OM1 vessel.  This graft.  The vein graft which had supplied the diagonal 1 vessel was occluded as was the vein graft which had sequentially supplied the PDA and PLA branch of the RCA.  Medical therapy was recommended.  Eliquis was resumed the day following the catheterization in light of his history of PACs.  His atrial flutter and  ischemiccardiomyopathy.  He presents now for evaluation.  Past Medical History  Diagnosis Date  . CAD (coronary artery disease)     a. s/p CABG 2003  . Paroxysmal atrial flutter     a. s/p succesful TEE/DCCV on 06/01/14.  b. on Eliquis  . Chronic systolic CHF (congestive heart failure)     a. 2D ECHO:  05/17/2014: EF 30-35%, mild concentric hypertrophy, diffuse hypokinesis, akinesis of the basalinferior myocardium, severe hypokinesis of the mid-apicalinferior myocardium. Aortic sclerosis, mild MR  . Cholecystitis   . HTN (hypertension)     Past Surgical History  Procedure Laterality Date  . Coronary artery bypass graft  2003    x 5  . Tee without cardioversion N/A 06/01/2014    Procedure: TRANSESOPHAGEAL ECHOCARDIOGRAM (TEE);  Surgeon: Lars MassonKatarina H Nelson, MD;  [facility];  Service: Cardiovascular;  Laterality: N/A;  . Cardioversion N/A 06/01/2014    Procedure: CARDIOVERSION;  Surgeon: Lars MassonKatarina H Nelson, MD;  [facility];  Service: Cardiovascular;  Laterality: N/A;    No Known Allergies  Current Outpatient Prescriptions  Medication Sig Dispense Refill  . apixaban (ELIQUIS) 5 MG TABS tablet Take 1 tablet (5 mg total) by mouth 2 (two) times daily. 60 tablet 11  . atorvastatin (LIPITOR) 80 MG tablet Take 1 tablet (80 mg total) by mouth daily at 6 PM. 30 tablet 11  . furosemide (LASIX) 40 MG tablet Take 1 tablet (40 mg total) by mouth 2 (two) times daily. 60 tablet 5  . isosorbide mononitrate (IMDUR) 30 MG 24 hr tablet Take 1 tablet (30 mg total) by mouth daily. 30 tablet 6  . lisinopril (PRINIVIL,ZESTRIL) 5 MG tablet Take 1 tablet (5 mg total) by mouth 2 (two) times daily. 60 tablet 11  . metoprolol succinate (TOPROL-XL) 100 MG 24 hr tablet Take 1 tablet (100 mg total) by mouth daily. (Patient taking differently: Take 125 mg by mouth daily. ) 30 tablet 5  . Multiple Vitamin (MULTIVITAMIN WITH MINERALS) TABS tablet Take 1 tablet by mouth daily.     No current facility-administered medications for this visit.    History   Social History  . Marital Status: Married    Spouse Name: N/A    Number of Children: N/A  . Years of Education: N/A   Occupational History  . Retired    Social History Main Topics  . Smoking status: Former Smoker -- 1.00 packs/day for 40  years    Types: Cigarettes    Quit date: 02/02/2002  . Smokeless tobacco: Never Used  . Alcohol Use: Yes     Comment: 6 pack of beer a week  . Drug Use: No  . Sexual Activity: Not on file   Other Topics Concern  . Not on file   Social History Narrative   [place]    Family History  Problem Relation Age of Onset  . Cancer Mother   . Cancer Father     ROS General: Negative; No fevers, chills, or night sweats HEENT: Negative; No changes in vision or hearing, sinus congestion, difficulty swallowing Pulmonary: Negative; No cough, wheezing, shortness of breath, hemoptysis Cardiovascular: See HPI: No chest pain, presyncope, syncope, palpatations GI:  No nausea, vomiting, diarrhea, or abdominal pain since his hospitalization GU: Negative; No dysuria, hematuria, or difficulty voiding Musculoskeletal: Negative; no myalgias, joint pain, or weakness Hematologic: Negative; no easy bruising, bleeding Endocrine: Negative; no heat/cold intolerance; no diabetes, Neuro: Negative; no changes in balance, headaches Skin: Negative; No rashes or  skin lesions Psychiatric: Negative; No behavioral problems, depression Sleep: positive for snoring and witnessed apnea;  No daytime sleepiness, hypersomnolence, bruxism, restless legs, hypnogognic hallucinations. Other comprehensive 14 point system review is negative   Physical Exam BP 120/80 mmHg  Pulse 61  Ht 5\' 11"  (1.803 m)  Wt 209 lb (94.802 kg)  BMI 29.16 kg/m2 General: Alert, oriented, no distress.  Skin: normal turgor, no rashes, warm and dry HEENT: Normocephalic, atraumatic. Pupils equal round and reactive to light; sclera anicteric; extraocular muscles intact, No lid lag; Nose without nasal septal hypertrophy; Mouth/Parynx benign; Mallinpatti scale 3 Neck: No JVD, no carotid bruits; normal carotid upstroke Lungs: clear to ausculatation and percussion bilaterally; no wheezing or rales, normal inspiratory and expiratory  effort Chest wall: without tenderness to palpitation Heart: PMI not displaced, RRR, s1 s2 normal, 2/6 systolic murmur, No diastolic murmur, no rubs, gallops, thrills, or heaves Abdomen: soft, nontender; no hepatosplenomehaly, BS+; abdominal aorta nontender and not dilated by palpation. Back: no CVA tenderness Pulses: 2+  Musculoskeletal: full range of motion, normal strength, no joint deformities Extremities: Pulses 2+, no clubbing cyanosis or edema, Homan's sign negative  Neurologic: grossly nonfocal; Cranial nerves grossly wnl Psychologic: Normal mood and affect  ECG (independently read by me): Normal sinus rhythm at 61 bpm.  QTc interval 455 ms.  PR interval 158 ms.  LABS:  BMET    Component Value Date/Time   NA 139 06/15/2014 1345   K 4.7 06/15/2014 1345   CL 103 06/15/2014 1345   CO2 24 06/15/2014 1345   GLUCOSE 105* 06/15/2014 1345   BUN 25* 06/15/2014 1345   CREATININE 1.10 06/15/2014 1345   CREATININE 1.0 06/09/2014 0957   CALCIUM 9.7 06/15/2014 1345   GFRNONAA 61* 06/02/2014 0315   GFRAA 71* 06/02/2014 0315     Hepatic Function Panel     Component Value Date/Time   PROT 6.7 06/15/2014 1345   ALBUMIN 4.2 06/15/2014 1345   AST 33 06/15/2014 1345   ALT 53 06/15/2014 1345   ALKPHOS 75 06/15/2014 1345   BILITOT 0.9 06/15/2014 1345   BILIDIR 0.2 05/29/2014 1416   IBILI 0.6 05/29/2014 1416     CBC    Component Value Date/Time   WBC 11.0* 06/15/2014 1345   RBC 5.64 06/15/2014 1345   HGB 16.3 06/15/2014 1345   HCT 50.1 06/15/2014 1345   PLT 279 06/15/2014 1345   MCV 88.8 06/15/2014 1345   MCH 28.9 06/15/2014 1345   MCHC 32.5 06/15/2014 1345   RDW 14.4 06/15/2014 1345   LYMPHSABS 2.5 05/30/2014 0336   MONOABS 1.0 05/30/2014 0336   EOSABS 0.2 05/30/2014 0336   BASOSABS 0.1 05/30/2014 0336     BNP    Component Value Date/Time   PROBNP 3333.0* 05/29/2014 1007    Lipid Panel     Component Value Date/Time   CHOL 166 06/15/2014 1345   TRIG 169*  06/15/2014 1345   HDL 51 06/15/2014 1345   CHOLHDL 3.3 06/15/2014 1345   VLDL 34 06/15/2014 1345   LDLCALC 81 06/15/2014 1345     RADIOLOGY: No results found.    ASSESSMENT AND PLAN: Mr. Chuck Caban is a 68 year old Caucasian male who is 12 years status post CABG surgery 5 by Dr. [name].   He is now 2 months after presenting to the hospital with atrial fibrillation/flutter with increased ventricular rate and was found to have a significant cardiomyopathy with an ejection fraction of 30-35% with diffuse hypokinesis.  He has been on anticoagulation with  Eliquis.  Since his cardioversion he is maintaining sinus rhythm.  I reviewed with him his cardiac catheterization findings in detail.  He has graft occlusion of the diagonal vessel as well as the RCA with collateralization documented as noted above and is consistent with his high risk nuclear perfusion study which led to his catheterization.  He has felt improved on his increased medical regimen.  Presently, I'm electing to add Ranexa 500 mg twice a day which should improve collateral filling, as well as reduce myocardial stiffness as result of blockade of the late inward sodium current and the sodium/calcium exchange mechanism.  This should also have potential reduction in recurrent atrial fibrillation risk.  He is maintaining sinus rhythm and is not having any bleeding issues on Eliquis.  I am reducing his atorvastatin to 40 mg.  I have scheduled him for diagnostic polysomnogram but he has not yet had this done.  The patient does have significant snoring and has had witnessed apnea.  [facility]  I will see him in 3 months for follow-up evaluation and further recommendations will be made at that time.  Time spent: 30  Lennette Biharihomas A. Fuller Makin, MD, [facility]  07/05/2014 9:43 AM

## 2014-07-06 DIAGNOSIS — I255 Ischemic cardiomyopathy: Secondary | ICD-10-CM | POA: Insufficient documentation

## 2014-07-11 ENCOUNTER — Ambulatory Visit: Payer: Medicare Other | Admitting: Cardiovascular Disease

## 2014-07-13 ENCOUNTER — Encounter (HOSPITAL_COMMUNITY): Payer: Self-pay | Admitting: Cardiovascular Disease

## 2014-08-15 ENCOUNTER — Other Ambulatory Visit: Payer: Self-pay

## 2014-08-15 MED ORDER — ATORVASTATIN CALCIUM 40 MG PO TABS: 40.0000 mg | ORAL_TABLET | Freq: Every day | ORAL | Status: DC

## 2014-08-15 NOTE — Telephone Encounter (Signed)
Rx(s) sent to pharmacy electronically.  

## 2014-09-06 ENCOUNTER — Ambulatory Visit (HOSPITAL_BASED_OUTPATIENT_CLINIC_OR_DEPARTMENT_OTHER): Payer: Medicare Other | Attending: Cardiovascular Disease | Admitting: Radiology

## 2014-09-06 VITALS — Ht 71.0 in | Wt 206.0 lb

## 2014-09-06 DIAGNOSIS — I2510 Atherosclerotic heart disease of native coronary artery without angina pectoris: Secondary | ICD-10-CM

## 2014-09-06 DIAGNOSIS — G4733 Obstructive sleep apnea (adult) (pediatric): Secondary | ICD-10-CM | POA: Diagnosis not present

## 2014-09-06 DIAGNOSIS — R9439 Abnormal result of other cardiovascular function study: Secondary | ICD-10-CM

## 2014-09-06 DIAGNOSIS — G4731 Primary central sleep apnea: Secondary | ICD-10-CM

## 2014-09-06 DIAGNOSIS — Z01812 Encounter for preprocedural laboratory examination: Secondary | ICD-10-CM

## 2014-09-10 NOTE — Addendum Note (Signed)
Addended by: Nicki GuadalajaraKELLY, THOMAS A on: 09/10/2014 12:04 PM   Modules accepted: Level of Service

## 2014-09-10 NOTE — Sleep Study (Signed)
NAME: Stanley Wells DATE OF BIRTH:  12/30/1945 MEDICAL RECORD NUMBER [id number]  LOCATION: [facility]  PHYSICIAN: Lincon Sahlin A  DATE OF STUDY: 09/06/2014  SLEEP STUDY TYPE: Nocturnal Polysomnogram               REFERRING PHYSICIAN: Lennette BihariKelly, Clea Dubach A, MD  INDICATION FOR STUDY:  Evaluation for obstructive sleep apnea in this patient with cardiovascular comorbidities including hypertension, coronary artery disease, status post CABG revascularization surgery, and paroxysmal atrial fibrillation who has a history of snoring, witnessed apnea, and daytime fatigue,   EPWORTH SLEEPINESS SCORE:  9 HEIGHT: 5\' 11"  (180.3 cm)  WEIGHT: 206 lb (93.441 kg)    Body mass index is 28.74 kg/(m^2).  NECK SIZE: 16.5 in.  MEDICATIONS:  ranolazine (RANEXA) 500 MG 12 hr tablet 500 mg, 2 times daily Multiple Vitamin (MULTIVITAMIN WITH MINERALS) TABS tablet 1 tablet, Daily metoprolol succinate (TOPROL-XL) 100 MG 24 hr tablet 100 mg, Daily  Patient taking differently: 125 mg Oral Daily, Informant: Spouse/Significant Other, Reported on 07/05/2014  lisinopril (PRINIVIL,ZESTRIL) 5 MG tablet 5 mg, 2 times daily isosorbide mononitrate (IMDUR) 30 MG 24 hr tablet 30 mg, Daily furosemide (LASIX) 40 MG tablet 40 mg, 2 times daily atorvastatin (LIPITOR) 40 MG tablet 40 mg, Daily apixaban (ELIQUIS) 5 MG TABS tablet 5 mg, 2 times daily   SLEEP ARCHITECTURE:  The patient slept for 222 minutes out of a sleep period of time of 362 minutes.  Sleep efficiency was reduced at 58.4%.  Sleep latency was 11.5 minutes.  Latency to REM sleep was 78 minutes.  The patient spent 59.5 minutes in stage I (26.8%) 114 minutes in stage II (51.6%) and 48 minutes in REM sleep (21.6%).  He spent 49.3% of the night and REM sleep of which 7.4% was supine REM sleep.  RESPIRATORY DATA:  During the sleep period time the patient had 4 obstructive apneas, 34 central apneas, 2 mixed apneas, and 45, hypotony is.  The apnea hypopnea  index (AHI) was 23.0 which places the patient in the category of moderate obstructive sleep apnea.  The respiratory disturbance index (RDI) was 35.4.  The AHI in REM sleep was 8.8%.  There was a positional component with supine sleep.  AHI at 23.5 per hour in nonsupine sleep, AHI at 4.2 per hour.  The arousal index was increased at 34.6.  OXYGEN DATA:  The baseline oxygen saturation was 94%.  The lowest oxygen saturation with non-REM sleep was 89% and with REM sleep was 88%.  CARDIAC DATA:  The patient was in sinus rhythm but had occasional PACs and several PVCs.  MOVEMENT/PARASOMNIA:  There were 0 periodic limb movements.  IMPRESSION/ RECOMMENDATION:   Moderate obstructive and central sleep apnea/hypoxia syndrome with events being worse in the supine position. Respiratory events with oxygen desaturation to a nadir of 88% in REM sleep. Mild snoring. Reduced sleep efficiency. No evidence for nocturnal myoclonus. The arousal index was abnormal.  CPAP titration is recommended in light of the severity of the patient's sleep apnea/hypoxia syndrome and its symptomatic nature in this patient with significant cardiovascular morbidities. Efforts should be made to optimize nasal and oropharyngeal patency.   The patient should be counseled in good sleep hygiene. The patient should be counseled to try avoiding sleep in the supine position.    Lennette BihariKELLY,Kathryn Linarez A Diplomate, American Board of Sleep Medicine  ELECTRONICALLY SIGNED ON:  09/10/2014, 11:48 AM  [facility] PH: [phone]   FX: [phone] ACCREDITED BY  THE AMERICAN ACADEMY OF SLEEP MEDICINE

## 2014-09-15 ENCOUNTER — Telehealth: Payer: Self-pay | Admitting: *Deleted

## 2014-09-15 NOTE — Telephone Encounter (Signed)
Called patient to inform him sleep study positive for sleep apnea. CPAP titration recommended. Patient informs me that " I'm not going to get it."   I told patient that is totally his decision. He can discuss this with Dr. Tresa EndoKelly @  his up coming appointment on March 7th.

## 2014-10-03 DIAGNOSIS — Z79899 Other long term (current) drug therapy: Secondary | ICD-10-CM | POA: Diagnosis not present

## 2014-10-03 DIAGNOSIS — I4892 Unspecified atrial flutter: Secondary | ICD-10-CM | POA: Diagnosis not present

## 2014-10-03 DIAGNOSIS — I2510 Atherosclerotic heart disease of native coronary artery without angina pectoris: Secondary | ICD-10-CM | POA: Diagnosis not present

## 2014-10-04 LAB — CBC
HEMATOCRIT: 40.2 % (ref 39.0–52.0)
HEMOGLOBIN: 13.3 g/dL (ref 13.0–17.0)
MCH: 30.3 pg (ref 26.0–34.0)
MCHC: 33.1 g/dL (ref 30.0–36.0)
MCV: 91.6 fL (ref 78.0–100.0)
MPV: 10.3 fL (ref 9.4–12.4)
Platelets: 235 10*3/uL (ref 150–400)
RBC: 4.39 MIL/uL (ref 4.22–5.81)
RDW: 16.5 % — ABNORMAL HIGH (ref 11.5–15.5)
WBC: 10.1 10*3/uL (ref 4.0–10.5)

## 2014-10-04 LAB — LIPID PANEL
CHOL/HDL RATIO: 3.3 ratio
Cholesterol: 119 mg/dL (ref 0–200)
HDL: 36 mg/dL — ABNORMAL LOW (ref >=40)
LDL CALC: 60 mg/dL (ref 0–99)
Triglycerides: 113 mg/dL (ref <150)
VLDL: 23 mg/dL (ref 0–40)

## 2014-10-04 LAB — COMPREHENSIVE METABOLIC PANEL
ALBUMIN: 4.2 g/dL (ref 3.5–5.2)
ALK PHOS: 74 U/L (ref 39–117)
ALT: 29 U/L (ref 0–53)
AST: 27 U/L (ref 0–37)
BILIRUBIN TOTAL: 1.4 mg/dL — AB (ref 0.2–1.2)
BUN: 24 mg/dL — ABNORMAL HIGH (ref 6–23)
CO2: 27 mEq/L (ref 19–32)
Calcium: 9.7 mg/dL (ref 8.4–10.5)
Chloride: 105 mEq/L (ref 96–112)
Creat: 1.13 mg/dL (ref 0.50–1.35)
Glucose, Bld: 113 mg/dL — ABNORMAL HIGH (ref 70–99)
Potassium: 4.7 mEq/L (ref 3.5–5.3)
SODIUM: 140 meq/L (ref 135–145)
TOTAL PROTEIN: 6.6 g/dL (ref 6.0–8.3)

## 2014-10-04 LAB — TSH: TSH: 1.124 u[IU]/mL (ref 0.350–4.500)

## 2014-10-09 ENCOUNTER — Ambulatory Visit (INDEPENDENT_AMBULATORY_CARE_PROVIDER_SITE_OTHER): Payer: Medicare Other | Admitting: Cardiovascular Disease

## 2014-10-09 VITALS — BP 170/98 | HR 61 | Ht 71.0 in | Wt 212.1 lb

## 2014-10-09 DIAGNOSIS — I255 Ischemic cardiomyopathy: Secondary | ICD-10-CM

## 2014-10-09 DIAGNOSIS — I4892 Unspecified atrial flutter: Secondary | ICD-10-CM | POA: Diagnosis not present

## 2014-10-09 DIAGNOSIS — G4733 Obstructive sleep apnea (adult) (pediatric): Secondary | ICD-10-CM

## 2014-10-09 DIAGNOSIS — Z79899 Other long term (current) drug therapy: Secondary | ICD-10-CM | POA: Diagnosis not present

## 2014-10-09 DIAGNOSIS — I2510 Atherosclerotic heart disease of native coronary artery without angina pectoris: Secondary | ICD-10-CM

## 2014-10-09 MED ORDER — LISINOPRIL 20 MG PO TABS: 20.0000 mg | ORAL_TABLET | Freq: Every day | ORAL | Status: DC

## 2014-10-09 NOTE — Patient Instructions (Signed)
Your physician wants you to follow-up in: 6 Months You will receive a reminder letter in the mail two months in advance. If you don't receive a letter, please call our office to schedule the follow-up appointment.  Your physician has recommended you make the following change in your medication: Decrease Lasix to 40 my daily and Increase Lisinopril to 20 mg daily  Your physician recommends that you return for lab work in: 2 Weeks BMP

## 2014-10-11 ENCOUNTER — Encounter: Payer: Self-pay | Admitting: Cardiovascular Disease

## 2014-10-11 DIAGNOSIS — G4733 Obstructive sleep apnea (adult) (pediatric): Secondary | ICD-10-CM

## 2014-10-11 HISTORY — DX: Obstructive sleep apnea (adult) (pediatric): G47.33

## 2014-10-11 NOTE — Progress Notes (Signed)
Patient ID: Stanley Wells, male   DOB: [birth year], 70 y.o.   MRN: [id number]     HPI: Stanley Wells is a 69 y.o. male who presents to the office today for cardiology evaluation following his recent cardiac catheterization.  Mr. Azizi has a history of CAD and in 2003, underwent CABGx5 revascularization surgery by Dr. Roxy Manns.  He had not seen a cardiologist since that time and has been followed in [place].  He has a history of continued tobacco use. In October 2015 he started to develop increasing episodes of dyspnea on exertion. He became aware that his heart rate was beating fast and saw his primary care physician who noted that he was in atrial fibrillation/flutter with a rapid ventricular response.  He presented to the emergency room on 05/16/2014 and was found to be in atrial flutter.  He was started on Eliquis anticoagulation.  An echo Doppler study showed an ejection fraction in the range of 30-35% with diffuse hypokinesis.  There was mild to moderate tricuspid regurgitation and moderately severe mitral regurgitation.  He underwent successful TEE guided DC cardioversion.  He was also felt to have gallbladder disease for which he underwent an abdominal CT as well as a hepatobiliary scan.  I have reviewed these results from his hospitalization.  He has noticed significantly more energy since restoration of sinus rhythm.  He was seen in follow-up by Alan Mulder, nurse practitioner and was maintaining sinus rhythm at that office visit.  He was referred for nuclear perfusion study on 06/13/2014. This was interpreted as a high risk nuclear study with moderate size, severely intense, partially reversible inferior defect consistent with infarct and mild peri-infarct ischemia and in addition, there was a moderate size, severely intense, reversible anterior defect consistent with moderate anterior ischemia.  He denies any chest pain.  However, he states he never really experienced chest pain prior to  his CABG revascularization surgery.   He underwent right and left heart cardiac catheterization on 06/19/2014 after his nuclear study was high risk with several regions of ischemia. Right are pressures were normal and not increased.  Catheterization revealed findings compatible with an ischemic cardiomyopathy with an ejection fraction of 35% and diffuse hypocontractility and trivial mitral regurgitation.  He had severe native CAD with total occlusion of the LAD at [facility], 70% proximal left circumflex stenosis, and total occlusion of the proximal RCA.  The LIMA graft supplying the LAD was widely patent and there was retrograde filling of the LAD up to the left main and distal filling with collateralization to both the diagonal vessel as well as to the mid RCA.  The vein graft supplying the obtuse marginal 2 vessel had mild luminal irregularities but was otherwise free of significant stenosis and there was filling of the AV groove circumflex and the OM1 vessel.  This graft.  The vein graft which had supplied the diagonal 1 vessel was occluded as was the vein graft which had sequentially supplied the PDA and PLA branch of the RCA.  Medical therapy was recommended.  Eliquis was resumed the day following the catheterization in light of his history of PACs.  His atrial flutter and ischemiccardiomyopathy.  He presents now for evaluation.  I was concerned about the strong possibility of obstructive sleep apnea with his wife noting that he snores loudly and she has witnessed apnea spells when her husband sleeps. .  He gets up several times per night for urination.  Since I saw him, I referred him for a  diagnostic polysomnogram which was done at [facility] on 09/06/2014.  This revealed moderate obstructive and central sleep apnea with events being worse in the supine position.  His overall AHI was 23 per hour and RDI 35.4 per hour.  He had a significant positional component with supine sleep compared to nonsupine  sleep.  His arousal index was severely abnormal.  He sucks Claiborne Billings was notified to undergo a CPAP/BiPAP titration.  He apparently has refused to undergo this and states he will not have this done due to hearing many stories of people cannot tolerate CPAP therapy.   Past Medical History  Diagnosis Date  . CAD (coronary artery disease)     a. s/p CABG 2003  . Paroxysmal atrial flutter     a. s/p succesful TEE/DCCV on 06/01/14.  b. on Eliquis  . Chronic systolic CHF (congestive heart failure)     a. 2D ECHO: 05/17/2014: EF 30-35%, mild concentric hypertrophy, diffuse hypokinesis, akinesis of the basalinferior myocardium, severe hypokinesis of the mid-apicalinferior myocardium. Aortic sclerosis, mild MR  . Cholecystitis   . HTN (hypertension)     Past Surgical History  Procedure Laterality Date  . Coronary artery bypass graft  2003    x 5  . Tee without cardioversion N/A 06/01/2014    Procedure: TRANSESOPHAGEAL ECHOCARDIOGRAM (TEE);  Surgeon: Dorothy Spark, MD;  Location: [place];  Service: Cardiovascular;  Laterality: N/A;  . Cardioversion N/A 06/01/2014    Procedure: CARDIOVERSION;  Surgeon: Dorothy Spark, MD;  Location: [facility] ENDOSCOPY;  Service: Cardiovascular;  Laterality: N/A;  . Cardiac catheterization N/A 06/19/2014    Procedure: RIGHT/LEFT HEART CATH AND CORONARY/GRAFT ANGIOGRAPHY;  Surgeon: Troy Sine, MD;  Location: [facility] CATH LAB;  Service: Cardiovascular;  Laterality: N/A;    No Known Allergies  Current Outpatient Prescriptions  Medication Sig Dispense Refill  . apixaban (ELIQUIS) 5 MG TABS tablet Take 1 tablet (5 mg total) by mouth 2 (two) times daily. 60 tablet 11  . atorvastatin (LIPITOR) 40 MG tablet Take 1 tablet (40 mg total) by mouth daily. 30 tablet 11  . furosemide (LASIX) 40 MG tablet Take 1 tablet (40 mg total) by mouth 2 (two) times daily. 60 tablet 5  . isosorbide mononitrate (IMDUR) 30 MG 24 hr tablet Take 1 tablet (30 mg total) by mouth daily. 30  tablet 6  . lisinopril (PRINIVIL,ZESTRIL) 20 MG tablet Take 1 tablet (20 mg total) by mouth daily. 30 tablet 6  . metoprolol succinate (TOPROL-XL) 100 MG 24 hr tablet Take 1 tablet (100 mg total) by mouth daily. (Patient taking differently: Take 125 mg by mouth daily. ) 30 tablet 5  . Multiple Vitamin (MULTIVITAMIN WITH MINERALS) TABS tablet Take 1 tablet by mouth daily.    . ranolazine (RANEXA) 500 MG 12 hr tablet Take 1 tablet (500 mg total) by mouth 2 (two) times daily. 60 tablet 6   No current facility-administered medications for this visit.    History   Social History  . Marital Status: Married    Spouse Name: N/A  . Number of Children: N/A  . Years of Education: N/A   Occupational History  . Retired    Social History Main Topics  . Smoking status: Former Smoker -- 1.00 packs/day for 40 years    Types: Cigarettes    Quit date: 02/02/2002  . Smokeless tobacco: Never Used  . Alcohol Use: Yes     Comment: 6 pack of beer a week  . Drug Use: No  .  Sexual Activity: Not on file   Other Topics Concern  . Not on file   Social History Narrative   Lives in [place] with wife.    Family History  Problem Relation Age of Onset  . Cancer Mother   . Cancer Father     ROS General: Negative; No fevers, chills, or night sweats HEENT: Negative; No changes in vision or hearing, sinus congestion, difficulty swallowing Pulmonary: Negative; No cough, wheezing, shortness of breath, hemoptysis Cardiovascular: See HPI: No chest pain, presyncope, syncope, palpatations GI:  No nausea, vomiting, diarrhea, or abdominal pain since his hospitalization GU: Negative; No dysuria, hematuria, or difficulty voiding Musculoskeletal: Negative; no myalgias, joint pain, or weakness Hematologic: Negative; no easy bruising, bleeding Endocrine: Negative; no heat/cold intolerance; no diabetes, Neuro: Negative; no changes in balance, headaches Skin: Negative; No rashes or skin lesions Psychiatric:  Negative; No behavioral problems, depression Sleep: positive for snoring and witnessed apnea;  No daytime sleepiness, hypersomnolence, bruxism, restless legs, hypnogognic hallucinations. Other comprehensive 14 point system review is negative   Physical Exam BP 170/98 mmHg  Pulse 61  Ht _0  (1.803 m)  Wt 212 lb 1.6 oz (96.208 kg)  BMI 29.60 kg/m2  Repeat blood pressure by me was 160/94. General: Alert, oriented, no distress.  Skin: normal turgor, no rashes, warm and dry HEENT: Normocephalic, atraumatic. Pupils equal round and reactive to light; sclera anicteric; extraocular muscles intact, No lid lag; Nose without nasal septal hypertrophy; Mouth/Parynx benign; Mallinpatti scale 3 Neck: No JVD, no carotid bruits; normal carotid upstroke Lungs: clear to ausculatation and percussion bilaterally; no wheezing or rales, normal inspiratory and expiratory effort Chest wall: without tenderness to palpitation Heart: PMI not displaced, RRR, s1 s2 normal, 2/6 systolic murmur, No diastolic murmur, no rubs, gallops, thrills, or heaves Abdomen: soft, nontender; no hepatosplenomehaly, BS+; abdominal aorta nontender and not dilated by palpation. Back: no CVA tenderness Pulses: 2+  Musculoskeletal: full range of motion, normal strength, no joint deformities Extremities: Pulses 2+, no clubbing cyanosis or edema, Homan's sign negative  Neurologic: grossly nonfocal; Cranial nerves grossly wnl Psychologic: Normal mood and affect  ECG (independently read by me): Sinus rhythm with sinus arrhythmia and occasional PVC.  Average particular rate 61 bpm.  07/05/2014 ECG (independently read by me): Normal sinus rhythm at 61 bpm.  QTc interval 455 ms.  PR interval 158 ms.  LABS:  BMET  BMP Latest Ref Rng 10/03/2014 06/15/2014 06/09/2014  Glucose 70 - 99 mg/dL 113(H) 105(H) 91  BUN 6 - 23 mg/dL 24(H) 25(H) 24(H)  Creatinine 0.50 - 1.35 mg/dL 1.13 1.10 1.0  Sodium 135 - 145 mEq/L 140 139 138  Potassium 3.5 -  5.3 mEq/L 4.7 4.7 4.1  Chloride 96 - 112 mEq/L 105 103 103  CO2 19 - 32 mEq/L _1 Calcium 8.4 - 10.5 mg/dL 9.7 9.7 9.3     Hepatic Function Panel   Hepatic Function Latest Ref Rng 10/03/2014 06/15/2014 05/30/2014  Total Protein 6.0 - 8.3 g/dL 6.6 6.7 5.1(L)  Albumin 3.5 - 5.2 g/dL 4.2 4.2 2.9(L)  AST 0 - 37 U/L 27 33 27  ALT 0 - 53 U/L 29 53 66(H)  Alk Phosphatase 39 - 117 U/L 74 75 57  Total Bilirubin 0.2 - 1.2 mg/dL 1.4(H) 0.9 0.7  Bilirubin, Direct 0.0 - 0.3 mg/dL - - -     CBC   CBC Latest Ref Rng 10/03/2014 06/15/2014 06/02/2014  WBC 4.0 - 10.5 K/uL 10.1 11.0(H) 10.6(H)  Hemoglobin 13.0 -  17.0 g/dL 13.3 16.3 13.2  Hematocrit 39.0 - 52.0 % 40.2 50.1 42.2  Platelets 150 - 400 K/uL 235 279 237    BNP    Component Value Date/Time   PROBNP 3333.0* 05/29/2014 1007    Lipid Panel     Component Value Date/Time   CHOL 119 10/03/2014 0902   TRIG 113 10/03/2014 0902   HDL 36* 10/03/2014 0902   CHOLHDL 3.3 10/03/2014 0902   VLDL 23 10/03/2014 0902   LDLCALC 60 10/03/2014 0902     RADIOLOGY: No results found.    ASSESSMENT AND PLAN: Mr. Jarmar Rousseau is a 69 year old Caucasian male who is status post CABG surgery 5 by Dr. Roxy Manns.   He developed atrial fibrillation/flutter with increased ventricular rate and was found to have a significant cardiomyopathy with an ejection fraction of 30-35% with diffuse hypokinesis.  He has been on anticoagulation with Eliquis.  Since his cardioversion he is maintaining sinus rhythm.  I again  reviewed with him his cardiac catheterization which demonstrated graft occlusion of the diagonal vessel as well as the RCA with collateralization documented as noted above and is consistent with his high risk nuclear perfusion study which led to his catheterization.  He has felt improved on his increased medical regimen.  I last saw him I added Ranexa 500 mg twice a day which should improve collateral filling, as well as reduce myocardial  stiffness as result of blockade of the late inward sodium current and the sodium/calcium exchange mechanism.  This should also have potential reduction in recurrent atrial fibrillation risk.  He is maintaining sinus rhythm and is not having any bleeding issues on Eliquis.  His lipid studies are excellent on his current dose of atorvastatin, which was reduced at his last visit to 40 mg.  I long discussion with him today and reviewed in detail his recent sleep study.  I stressed the importance of treatment for his moderate sleep apnea which has both obstructive as well as central components.  He is adamant that he will not undergo CPAP titration.  His blood pressure today is elevated.  He is not having significant edema.  I have recommended he change his Lasix from 40 mg twice a day to 40 mg in the morning and will further titrate his lisinopril from 5 mg twice a day To 10 mg twice a day until his 5 mg pills run out and then I have given him a prescription for 20 mg for him to take once in the morning.  He will have a Bmet in 2 weeks.  I will see him in 6 months for reevaluation.  Time spent: [place], MD, [facility]  10/11/2014 2:57 PM

## 2014-10-26 DIAGNOSIS — Z79899 Other long term (current) drug therapy: Secondary | ICD-10-CM | POA: Diagnosis not present

## 2014-10-27 LAB — BASIC METABOLIC PANEL
BUN: 20 mg/dL (ref 6–23)
CO2: 26 meq/L (ref 19–32)
CREATININE: 1 mg/dL (ref 0.50–1.35)
Calcium: 9.3 mg/dL (ref 8.4–10.5)
Chloride: 105 mEq/L (ref 96–112)
GLUCOSE: 110 mg/dL — AB (ref 70–99)
Potassium: 4.5 mEq/L (ref 3.5–5.3)
Sodium: 140 mEq/L (ref 135–145)

## 2014-10-30 ENCOUNTER — Telehealth: Payer: Self-pay | Admitting: *Deleted

## 2014-10-30 NOTE — Telephone Encounter (Signed)
Spoke to patient. Result given . Verbalized understanding  

## 2014-10-30 NOTE — Telephone Encounter (Signed)
-----   Message from Lennette Biharihomas A Kelly, MD sent at 10/28/2014 10:28 AM EDT ----- labbs good; glu 110

## 2014-10-31 ENCOUNTER — Encounter: Payer: Self-pay | Admitting: *Deleted

## 2014-11-16 ENCOUNTER — Other Ambulatory Visit: Payer: Self-pay

## 2014-11-16 MED ORDER — METOPROLOL SUCCINATE ER 100 MG PO TB24: 100.0000 mg | ORAL_TABLET | Freq: Every day | ORAL | Status: DC

## 2014-11-16 NOTE — Telephone Encounter (Signed)
Rx(s) sent to pharmacy electronically.  

## 2015-01-02 ENCOUNTER — Other Ambulatory Visit: Payer: Self-pay | Admitting: *Deleted

## 2015-01-02 MED ORDER — ISOSORBIDE MONONITRATE ER 30 MG PO TB24: 30.0000 mg | ORAL_TABLET | Freq: Every day | ORAL | Status: DC

## 2015-02-01 ENCOUNTER — Other Ambulatory Visit: Payer: Self-pay | Admitting: *Deleted

## 2015-02-01 MED ORDER — RANOLAZINE ER 500 MG PO TB12: 500.0000 mg | ORAL_TABLET | Freq: Two times a day (BID) | ORAL | Status: DC

## 2015-02-06 ENCOUNTER — Other Ambulatory Visit: Payer: Self-pay | Admitting: *Deleted

## 2015-02-06 MED ORDER — FUROSEMIDE 40 MG PO TABS: 40.0000 mg | ORAL_TABLET | Freq: Two times a day (BID) | ORAL | Status: DC

## 2015-02-06 NOTE — Telephone Encounter (Signed)
Rx(s) sent to pharmacy electronically.  

## 2015-05-01 ENCOUNTER — Encounter: Payer: Self-pay | Admitting: Cardiovascular Disease

## 2015-05-01 ENCOUNTER — Ambulatory Visit (INDEPENDENT_AMBULATORY_CARE_PROVIDER_SITE_OTHER): Payer: Medicare Other | Admitting: Cardiovascular Disease

## 2015-05-01 VITALS — BP 162/104 | HR 63 | Ht 71.0 in | Wt 212.5 lb

## 2015-05-01 DIAGNOSIS — I10 Essential (primary) hypertension: Secondary | ICD-10-CM

## 2015-05-01 DIAGNOSIS — G4733 Obstructive sleep apnea (adult) (pediatric): Secondary | ICD-10-CM

## 2015-05-01 DIAGNOSIS — E785 Hyperlipidemia, unspecified: Secondary | ICD-10-CM

## 2015-05-01 DIAGNOSIS — I25810 Atherosclerosis of coronary artery bypass graft(s) without angina pectoris: Secondary | ICD-10-CM | POA: Diagnosis not present

## 2015-05-01 DIAGNOSIS — I480 Paroxysmal atrial fibrillation: Secondary | ICD-10-CM | POA: Diagnosis not present

## 2015-05-01 DIAGNOSIS — I4892 Unspecified atrial flutter: Secondary | ICD-10-CM

## 2015-05-01 DIAGNOSIS — I255 Ischemic cardiomyopathy: Secondary | ICD-10-CM

## 2015-05-01 DIAGNOSIS — I4891 Unspecified atrial fibrillation: Secondary | ICD-10-CM | POA: Diagnosis not present

## 2015-05-01 LAB — COMPREHENSIVE METABOLIC PANEL WITH GFR
ALT: 21 U/L (ref 9–46)
AST: 20 U/L (ref 10–35)
Albumin: 4 g/dL (ref 3.6–5.1)
Alkaline Phosphatase: 64 U/L (ref 40–115)
BUN: 22 mg/dL (ref 7–25)
CO2: 27 mmol/L (ref 20–31)
Calcium: 9.4 mg/dL (ref 8.6–10.3)
Chloride: 102 mmol/L (ref 98–110)
Creat: 0.9 mg/dL (ref 0.70–1.25)
Glucose, Bld: 102 mg/dL — ABNORMAL HIGH (ref 65–99)
Potassium: 4.4 mmol/L (ref 3.5–5.3)
Sodium: 134 mmol/L — ABNORMAL LOW (ref 135–146)
Total Bilirubin: 0.8 mg/dL (ref 0.2–1.2)
Total Protein: 6.4 g/dL (ref 6.1–8.1)

## 2015-05-01 LAB — CBC
HCT: 42.6 % (ref 39.0–52.0)
Hemoglobin: 14.6 g/dL (ref 13.0–17.0)
MCH: 31.4 pg (ref 26.0–34.0)
MCHC: 34.3 g/dL (ref 30.0–36.0)
MCV: 91.6 fL (ref 78.0–100.0)
MPV: 10.4 fL (ref 8.6–12.4)
PLATELETS: 214 10*3/uL (ref 150–400)
RBC: 4.65 MIL/uL (ref 4.22–5.81)
RDW: 14.3 % (ref 11.5–15.5)
WBC: 9.6 10*3/uL (ref 4.0–10.5)

## 2015-05-01 LAB — LIPID PANEL
Cholesterol: 213 mg/dL — ABNORMAL HIGH (ref 125–200)
HDL: 38 mg/dL — ABNORMAL LOW (ref >=40)
LDL Cholesterol: 126 mg/dL (ref <130)
Total CHOL/HDL Ratio: 5.6 ratio — ABNORMAL HIGH (ref <=5.0)
Triglycerides: 245 mg/dL — ABNORMAL HIGH (ref <150)
VLDL: 49 mg/dL — ABNORMAL HIGH (ref <30)

## 2015-05-01 MED ORDER — FUROSEMIDE 40 MG PO TABS: 40.0000 mg | ORAL_TABLET | Freq: Every day | ORAL | Status: DC

## 2015-05-01 MED ORDER — AMLODIPINE BESYLATE 5 MG PO TABS: 5.0000 mg | ORAL_TABLET | Freq: Every day | ORAL | Status: DC

## 2015-05-01 MED ORDER — LISINOPRIL 20 MG PO TABS: 30.0000 mg | ORAL_TABLET | Freq: Every day | ORAL | Status: DC

## 2015-05-01 NOTE — Patient Instructions (Signed)
Your physician has recommended you make the following change in your medication: increase the lisinopril to 30 mg daily. ( 1& 1/2 tablets daily). Start new prescription for amlodipine 5 mg.daily. New prescriptions were sent to your pharmacy.  Your physician recommends that you return for lab work today.  Your physician recommends that you schedule a follow-up appointment in: 3-4 months.

## 2015-05-01 NOTE — Progress Notes (Signed)
Patient ID: Stanley Wells, male   DOB: [birth year], 69 y.o.   MRN: [id number]    HPI: Stanley Wells is a 69 y.o. male who presents to the office today for cardiology evaluation following his recent cardiac catheterization.  Stanley Wells has a history of CAD and in 2003, underwent CABGx5 revascularization surgery by Dr. Roxy Manns.  He had not seen a cardiologist since that time and has been followed in [place] until 2015.  He has a history of continued tobacco use. In October 2015 he started to develop increasing episodes of dyspnea on exertion. He became aware that his heart rate was beating fast and saw his primary care physician who noted that he was in atrial fibrillation/flutter with a rapid ventricular response.  He presented to the emergency room on 05/16/2014 and was found to be in atrial flutter.  He was started on Eliquis anticoagulation.  An echo Doppler study showed an ejection fraction of 30-35% with diffuse hypokinesis.  There was mild to moderate tricuspid regurgitation and moderately severe mitral regurgitation.  He underwent successful TEE guided DC cardioversion.  He was also felt to have gallbladder disease for which he underwent an abdominal CT as well as a hepatobiliary scan.  He has noticed significantly more energy since restoration of sinus rhythm.  He was referred for nuclear perfusion study on 06/13/2014 which was interpreted as a high risk nuclear study with moderate size, severely intense, partially reversible inferior defect consistent with infarct and mild peri-infarct ischemia and in addition, there was a moderate size, severely intense, reversible anterior defect consistent with moderate anterior ischemia.  He denied any chest pain.  However, he states he never really experienced chest pain prior to his CABG revascularization surgery.  He underwent right and left heart cardiac catheterization on 06/19/2014 after his nuclear study was high risk with several regions of ischemia.  Right heart pressures were normal.  Catheterization revealed findings compatible with an ischemic cardiomyopathy with an ejection fraction of 35% and diffuse hypocontractility and trivial mitral regurgitation.  He had severe native CAD with total occlusion of the LAD at [name], 70% proximal left circumflex stenosis, and total occlusion of the proximal RCA.  The LIMA graft supplying the LAD was widely patent and there was retrograde filling of the LAD up to the left main and distal filling with collateralization to both the diagonal vessel as well as to the mid RCA.  The vein graft supplying the obtuse marginal 2 vessel had mild luminal irregularities but was otherwise free of significant stenosis and there was filling of the AV groove circumflex and the OM1 vessel.  This graft.  The vein graft which had supplied the diagonal 1 vessel was occluded as was the vein graft which had sequentially supplied the PDA and PLA branch of the RCA.  Medical therapy was recommended.  Eliquis was resumed the day following the catheterization in light of his history of PACs.  His atrial flutter and ischemiccardiomyopathy.  He presents now for evaluation.  I was concerned about the strong possibility of obstructive sleep apnea with his wife noting that he snores loudly and she has witnessed apnea spells when her husband sleeps. .  He gets up several times per night for urination.  Since I saw him, I referred him for a diagnostic polysomnogram which was done at [facility] on 09/06/2014.  This revealed moderate obstructive and central sleep apnea with events being worse in the supine position.  His overall AHI was 23 per hour  and RDI 35.4 per hour.  He had a significant positional component with supine sleep compared to nonsupine sleep.  His arousal index was severely abnormal.   It was recommended that he have a CPAP titration, but he never had this done and does not wish to pursue this.   Over the past 6 months, he denies  recurrent episodes of chest pain or shortness of breath.  He denies palpitations. He denies PND, orthopnea.  He presents for evaluation.  Past Medical History  Diagnosis Date  . CAD (coronary artery disease)     a. s/p CABG 2003  . Paroxysmal atrial flutter     a. s/p succesful TEE/DCCV on 06/01/14.  b. on Eliquis  . Chronic systolic CHF (congestive heart failure)     a. 2D ECHO: 05/17/2014: EF 30-35%, mild concentric hypertrophy, diffuse hypokinesis, akinesis of the basalinferior myocardium, severe hypokinesis of the mid-apicalinferior myocardium. Aortic sclerosis, mild MR  . Cholecystitis   . HTN (hypertension)     Past Surgical History  Procedure Laterality Date  . Coronary artery bypass graft  2003    x 5  . Tee without cardioversion N/A 06/01/2014    Procedure: TRANSESOPHAGEAL ECHOCARDIOGRAM (TEE);  Surgeon: Dorothy Spark, MD;  Location: [place];  Service: Cardiovascular;  Laterality: N/A;  . Cardioversion N/A 06/01/2014    Procedure: CARDIOVERSION;  Surgeon: Dorothy Spark, MD;  Location: [facility] ENDOSCOPY;  Service: Cardiovascular;  Laterality: N/A;  . Cardiac catheterization N/A 06/19/2014    Procedure: RIGHT/LEFT HEART CATH AND CORONARY/GRAFT ANGIOGRAPHY;  Surgeon: Troy Sine, MD;  Location: [facility] CATH LAB;  Service: Cardiovascular;  Laterality: N/A;    No Known Allergies  Current Outpatient Prescriptions  Medication Sig Dispense Refill  . apixaban (ELIQUIS) 5 MG TABS tablet Take 1 tablet (5 mg total) by mouth 2 (two) times daily. 60 tablet 11  . atorvastatin (LIPITOR) 40 MG tablet Take 1 tablet (40 mg total) by mouth daily. 30 tablet 11  . furosemide (LASIX) 40 MG tablet Take 1 tablet (40 mg total) by mouth daily. 60 tablet 8  . isosorbide mononitrate (IMDUR) 30 MG 24 hr tablet Take 1 tablet (30 mg total) by mouth daily. 30 tablet 6  . lisinopril (PRINIVIL,ZESTRIL) 20 MG tablet Take 1.5 tablets (30 mg total) by mouth daily. 45 tablet 6  . metoprolol succinate  (TOPROL-XL) 100 MG 24 hr tablet Take 1 tablet (100 mg total) by mouth daily. 30 tablet 11  . Multiple Vitamin (MULTIVITAMIN WITH MINERALS) TABS tablet Take 1 tablet by mouth daily.    . ranolazine (RANEXA) 500 MG 12 hr tablet Take 1 tablet (500 mg total) by mouth 2 (two) times daily. 60 tablet 3  . amLODipine (NORVASC) 5 MG tablet Take 1 tablet (5 mg total) by mouth daily. 30 tablet 6   No current facility-administered medications for this visit.    Social History   Social History  . Marital Status: Married    Spouse Name: N/A  . Number of Children: N/A  . Years of Education: N/A   Occupational History  . Retired    Social History Main Topics  . Smoking status: Former Smoker -- 1.00 packs/day for 40 years    Types: Cigarettes    Quit date: 02/02/2002  . Smokeless tobacco: Never Used  . Alcohol Use: Yes     Comment: 6 pack of beer a week  . Drug Use: No  . Sexual Activity: Not on file   Other Topics Concern  .  Not on file   Social History Narrative   Lives in [place] with wife.    Family History  Problem Relation Age of Onset  . Cancer Mother   . Cancer Father     ROS General: Negative; No fevers, chills, or night sweats HEENT: Negative; No changes in vision or hearing, sinus congestion, difficulty swallowing Pulmonary: Negative; No cough, wheezing, shortness of breath, hemoptysis Cardiovascular: See HPI: No chest pain, presyncope, syncope, palpatations GI:  No nausea, vomiting, diarrhea, or abdominal pain since his hospitalization GU: Negative; No dysuria, hematuria, or difficulty voiding Musculoskeletal: Negative; no myalgias, joint pain, or weakness Hematologic: Negative; no easy bruising, bleeding Endocrine: Negative; no heat/cold intolerance; no diabetes, Neuro: Negative; no changes in balance, headaches Skin: Negative; No rashes or skin lesions Psychiatric: Negative; No behavioral problems, depression Sleep: positive for snoring and witnessed apnea;  No  daytime sleepiness, hypersomnolence, bruxism, restless legs, hypnogognic hallucinations. Other comprehensive 14 point system review is negative   Physical Exam BP 162/104 mmHg  Pulse 63  Ht $R'5\' 11"'NW$  (1.803 m)  Wt 212 lb 8 oz (96.389 kg)  BMI 29.65 kg/m2  Repeat blood pressure by me was 170/98. Wt Readings from Last 3 Encounters:  05/01/15 212 lb 8 oz (96.389 kg)  10/09/14 212 lb 1.6 oz (96.208 kg)  09/06/14 206 lb (93.441 kg)   General: Alert, oriented, no distress.  Skin: normal turgor, no rashes, warm and dry HEENT: Normocephalic, atraumatic. Pupils equal round and reactive to light; sclera anicteric; extraocular muscles intact, No lid lag; Nose without nasal septal hypertrophy; Mouth/Parynx benign; Mallinpatti scale 3 Neck: No JVD, no carotid bruits; normal carotid upstroke Lungs: clear to ausculatation and percussion bilaterally; no wheezing or rales, normal inspiratory and expiratory effort Chest wall: without tenderness to palpitation Heart: PMI not displaced, RRR, s1 s2 normal, 2/6 systolic murmur, No diastolic murmur, no rubs, gallops, thrills, or heaves Abdomen: soft, nontender; no hepatosplenomehaly, BS+; abdominal aorta nontender and not dilated by palpation. Back: no CVA tenderness Pulses: 2+  Musculoskeletal: full range of motion, normal strength, no joint deformities Extremities: Pulses 2+, no clubbing cyanosis or edema, Homan's sign negative  Neurologic: grossly nonfocal; Cranial nerves grossly wnl Psychologic: Normal mood and affect  ECG (independently read by me):  Sinus rhythm with premature atrial complex.  No significant ST-T change.  March 2016ECG (independently read by me): Sinus rhythm with sinus arrhythmia and occasional PVC.  Average particular rate 61 bpm.  07/05/2014 ECG (independently read by me): Normal sinus rhythm at 61 bpm.  QTc interval 455 ms.  PR interval 158 ms.  LABS:  BMP Latest Ref Rng 10/26/2014 10/03/2014 06/15/2014  Glucose 70 - 99 mg/dL  110(H) 113(H) 105(H)  BUN 6 - 23 mg/dL 20 24(H) 25(H)  Creatinine 0.50 - 1.35 mg/dL 1.00 1.13 1.10  Sodium 135 - 145 mEq/L 140 140 139  Potassium 3.5 - 5.3 mEq/L 4.5 4.7 4.7  Chloride 96 - 112 mEq/L 105 105 103  CO2 19 - 32 mEq/L $Remove'26 27 24  'cDQsSyl$ Calcium 8.4 - 10.5 mg/dL 9.3 9.7 9.7    Hepatic Function Latest Ref Rng 10/03/2014 06/15/2014 05/30/2014  Total Protein 6.0 - 8.3 g/dL 6.6 6.7 5.1(L)  Albumin 3.5 - 5.2 g/dL 4.2 4.2 2.9(L)  AST 0 - 37 U/L 27 33 27  ALT 0 - 53 U/L 29 53 66(H)  Alk Phosphatase 39 - 117 U/L 74 75 57  Total Bilirubin 0.2 - 1.2 mg/dL 1.4(H) 0.9 0.7  Bilirubin, Direct 0.0 - 0.3 mg/dL - - -  CBC Latest Ref Rng 10/03/2014 06/15/2014 06/02/2014  WBC 4.0 - 10.5 K/uL 10.1 11.0(H) 10.6(H)  Hemoglobin 13.0 - 17.0 g/dL 13.3 16.3 13.2  Hematocrit 39.0 - 52.0 % 40.2 50.1 42.2  Platelets 150 - 400 K/uL 235 279 237   Lab Results  Component Value Date   MCV 91.6 10/03/2014   MCV 88.8 06/15/2014   MCV 92.1 06/02/2014   Lab Results  Component Value Date   TSH 1.124 10/03/2014   Lab Results  Component Value Date   HGBA1C 6.4* 05/16/2014   Lipid Panel     Component Value Date/Time   CHOL 119 10/03/2014 0902   TRIG 113 10/03/2014 0902   HDL 36* 10/03/2014 0902   CHOLHDL 3.3 10/03/2014 0902   VLDL 23 10/03/2014 0902   LDLCALC 60 10/03/2014 0902   RADIOLOGY: No results found.    ASSESSMENT AND PLAN: Stanley Wells is a 69 year old Caucasian male who is 13 years status post CABG surgery 5 by Dr. Roxy Manns.   He developed atrial fibrillation/flutter with increased ventricular rate and was found to have a significant cardiomyopathy with an ejection fraction of 30-35% with diffuse hypokinesis.  He has been on anticoagulation with Eliquis.  Since his cardioversion he is maintaining sinus rhythm.  I again  reviewed with him his cardiac catheterization which demonstrated graft occlusion of the diagonal vessel as well as the RCA with collateralization documented as noted above  and is consistent with his high risk nuclear perfusion study which led to his catheterization.  He has felt improved on his increased medical regimen.  I had added Ranexa 500 mg twice a day to improve collateral filling, as well as reduce myocardial stiffness as result of blockade of the late inward sodium current and the sodium/calcium exchange mechanism.  This should also have potential reduction in recurrent atrial fibrillation risk.  He is maintaining sinus rhythm and is not having any bleeding issues on Eliquis.  His lipid studies are excellent on his current dose of atorvastatin 40 mg.   When I last saw my further titrated his lisinopril.  His blood pressure today  continues to be elevated despite taking  Furosemide 40 mg daily, lisinopril 20 mg, Toprol-XL 100 mg in addition to his isosorbide mononitrate.  He also is tolerating Ranexa 500 mg twice a day.  He is without anginal symptoms.  I'm adding amlodipine 5 mg for more optimal blood pressure control and will further titrate his lisinopril to 30 mg daily. subsequent blood work consisting of a incompetence.  A  Comprehensive metabolic panel, lipid and CBC will be obtained. I will contact him regarding the results.  I will see him in 3-4 months for reevaluation.  Time spent: [place], MD, [facility]  05/01/2015 6:38 PM

## 2015-05-03 ENCOUNTER — Telehealth: Payer: Self-pay | Admitting: Cardiovascular Disease

## 2015-05-03 NOTE — Telephone Encounter (Signed)
Pt tried to take take the generic Norvasc,it gave him chest pain. Pt took it one time and have not taken it since.Also would like his lab results from 05-01-15 please.

## 2015-05-03 NOTE — Telephone Encounter (Signed)
Chest pain is not a side effect of amlodipine.  Cannot say for sure that this was the cause from only 1 tablet.  Would suggest he re-start it.  It is not absorbed sufficiently in 15 minutes to cause problems.

## 2015-05-03 NOTE — Telephone Encounter (Signed)
Patient said that he took first time Norvasc (generic) at 830pm Tuesday night along with his Wliquist, Imdur and Renexa.  15 minutes after taking developed chest pain lasting 15 minutes  Last night he eliminated Norvasc (generic), took all his other medications for 830pm and did not have chest pain  He feels that the Norvasc (generic) caused the pain  Routing to Phillips Hay, pharmacist and Dr. Tresa Endo

## 2015-05-04 NOTE — Telephone Encounter (Signed)
ok 

## 2015-05-04 NOTE — Telephone Encounter (Signed)
Suggested to patient to take medication in the morning an hour apart from his other medications.  If he finds he is having symptoms to call us back

## 2015-05-11 ENCOUNTER — Telehealth: Payer: Self-pay | Admitting: Cardiovascular Disease

## 2015-05-11 NOTE — Telephone Encounter (Signed)
Spoke with pt wife, the pt is reporting his heart rate is increasing for about 10 to 15 min after taking the amlodipine. He denies any SOB or chest pain and the pulse is not irregular. They want to make sure this is okay. Will forward to [employer] md for advise

## 2015-05-11 NOTE — Telephone Encounter (Signed)
Spoke with pt wife, aware of kristin's advise

## 2015-05-11 NOTE — Telephone Encounter (Signed)
Please call,he thinks the Norvasc is making his heart rate increase.

## 2015-05-11 NOTE — Telephone Encounter (Signed)
Palpitations/increased heart rate does happen with amlodipine.  Should stop after taking for a week or so.  If continues long term will consider another medication.  Not necessarily a problem, just an inconvenience.

## 2015-05-16 ENCOUNTER — Telehealth: Payer: Self-pay | Admitting: *Deleted

## 2015-05-16 NOTE — Telephone Encounter (Signed)
-----   Message from Thomas A Kelly, MD sent at 05/15/2015  3:42 PM EDT ----- Labs good x lipid; if still on atorva 40 mg will add zetia 10 mg; if does  not start zetia than inc atorva to 80 mg 

## 2015-05-16 NOTE — Telephone Encounter (Signed)
Left message with wife to have patient to return a call to discuss lab results.

## 2015-05-17 ENCOUNTER — Telehealth: Payer: Self-pay | Admitting: Cardiovascular Disease

## 2015-05-17 DIAGNOSIS — Z79899 Other long term (current) drug therapy: Secondary | ICD-10-CM

## 2015-05-17 DIAGNOSIS — E785 Hyperlipidemia, unspecified: Secondary | ICD-10-CM

## 2015-05-17 DIAGNOSIS — R899 Unspecified abnormal finding in specimens from other organs, systems and tissues: Secondary | ICD-10-CM

## 2015-05-17 NOTE — Telephone Encounter (Signed)
Returning your call. °

## 2015-05-22 MED ORDER — ATORVASTATIN CALCIUM 80 MG PO TABS
80.0000 mg | ORAL_TABLET | Freq: Every day | ORAL | Status: DC
Start: 2015-05-22 — End: 2015-07-07

## 2015-05-22 NOTE — Telephone Encounter (Signed)
-----   Message from Lennette Biharihomas A Kelly, MD sent at 05/15/2015  3:42 PM EDT ----- Labs good x lipid; if still on atorva 40 mg will add zetia 10 mg; if does  not start zetia than inc atorva to 80 mg

## 2015-05-22 NOTE — Telephone Encounter (Signed)
Called and discussed patient lab results and recommendations. Patient voiced understanding and opted to increase the atorvastatin to 80 mg instead of adding zetia.

## 2015-06-01 ENCOUNTER — Other Ambulatory Visit: Payer: Self-pay | Admitting: *Deleted

## 2015-06-01 MED ORDER — APIXABAN 5 MG PO TABS: 5.0000 mg | ORAL_TABLET | Freq: Two times a day (BID) | ORAL | Status: DC

## 2015-06-02 ENCOUNTER — Other Ambulatory Visit: Payer: Self-pay | Admitting: Cardiovascular Disease

## 2015-06-11 ENCOUNTER — Emergency Department (HOSPITAL_COMMUNITY)
Admission: EM | Admit: 2015-06-11 | Discharge: 2015-06-11 | Disposition: A | Discharge status: Home / Self Care | Payer: Medicare Other | Attending: Emergency Medicine | Admitting: Emergency Medicine

## 2015-06-11 ENCOUNTER — Telehealth: Payer: Self-pay | Admitting: Cardiovascular Disease

## 2015-06-11 ENCOUNTER — Encounter (HOSPITAL_COMMUNITY): Payer: Self-pay | Admitting: Family Medicine

## 2015-06-11 ENCOUNTER — Emergency Department (HOSPITAL_COMMUNITY): Payer: Medicare Other

## 2015-06-11 DIAGNOSIS — I5022 Chronic systolic (congestive) heart failure: Secondary | ICD-10-CM | POA: Insufficient documentation

## 2015-06-11 DIAGNOSIS — I5042 Chronic combined systolic (congestive) and diastolic (congestive) heart failure: Secondary | ICD-10-CM | POA: Diagnosis present

## 2015-06-11 DIAGNOSIS — Z7901 Long term (current) use of anticoagulants: Secondary | ICD-10-CM

## 2015-06-11 DIAGNOSIS — R000 Tachycardia, unspecified: Secondary | ICD-10-CM | POA: Diagnosis not present

## 2015-06-11 DIAGNOSIS — Z87891 Personal history of nicotine dependence: Secondary | ICD-10-CM | POA: Diagnosis not present

## 2015-06-11 DIAGNOSIS — I10 Essential (primary) hypertension: Secondary | ICD-10-CM | POA: Diagnosis present

## 2015-06-11 DIAGNOSIS — I255 Ischemic cardiomyopathy: Secondary | ICD-10-CM | POA: Diagnosis not present

## 2015-06-11 DIAGNOSIS — I4892 Unspecified atrial flutter: Secondary | ICD-10-CM | POA: Insufficient documentation

## 2015-06-11 DIAGNOSIS — Z79899 Other long term (current) drug therapy: Secondary | ICD-10-CM | POA: Insufficient documentation

## 2015-06-11 DIAGNOSIS — I2510 Atherosclerotic heart disease of native coronary artery without angina pectoris: Secondary | ICD-10-CM | POA: Diagnosis present

## 2015-06-11 DIAGNOSIS — R0602 Shortness of breath: Secondary | ICD-10-CM | POA: Diagnosis present

## 2015-06-11 LAB — I-STAT TROPONIN, ED: TROPONIN I, POC: 0.03 ng/mL (ref 0.00–0.08)

## 2015-06-11 LAB — BASIC METABOLIC PANEL
Anion gap: 10 (ref 5–15)
BUN: 17 mg/dL (ref 6–20)
CO2: 23 mmol/L (ref 22–32)
CREATININE: 1 mg/dL (ref 0.61–1.24)
Calcium: 9.2 mg/dL (ref 8.9–10.3)
Chloride: 105 mmol/L (ref 101–111)
GFR calc Af Amer: >60 mL/min (ref >60)
GLUCOSE: 123 mg/dL — AB (ref 65–99)
Potassium: 3.9 mmol/L (ref 3.5–5.1)
Sodium: 138 mmol/L (ref 135–145)

## 2015-06-11 LAB — I-STAT CHEM 8, ED
BUN: 19 mg/dL (ref 6–20)
CREATININE: 1 mg/dL (ref 0.61–1.24)
Calcium, Ion: 1.14 mmol/L (ref 1.13–1.30)
Chloride: 103 mmol/L (ref 101–111)
GLUCOSE: 117 mg/dL — AB (ref 65–99)
HEMATOCRIT: 45 % (ref 39.0–52.0)
HEMOGLOBIN: 15.3 g/dL (ref 13.0–17.0)
POTASSIUM: 3.8 mmol/L (ref 3.5–5.1)
Sodium: 140 mmol/L (ref 135–145)
TCO2: 23 mmol/L (ref 0–100)

## 2015-06-11 LAB — CBC
HCT: 43.4 % (ref 39.0–52.0)
Hemoglobin: 13.9 g/dL (ref 13.0–17.0)
MCH: 30 pg (ref 26.0–34.0)
MCHC: 32 g/dL (ref 30.0–36.0)
MCV: 93.5 fL (ref 78.0–100.0)
PLATELETS: 258 10*3/uL (ref 150–400)
RBC: 4.64 MIL/uL (ref 4.22–5.81)
RDW: 14.2 % (ref 11.5–15.5)
WBC: 12.7 10*3/uL — AB (ref 4.0–10.5)

## 2015-06-11 MED ORDER — DILTIAZEM LOAD VIA INFUSION
15.0000 mg | Freq: Once | INTRAVENOUS | Status: AC
  Administered 2015-06-11: 15 mg via INTRAVENOUS
  Filled 2015-06-11: qty 15

## 2015-06-11 MED ORDER — METOPROLOL SUCCINATE ER 50 MG PO TB24: 50.0000 mg | ORAL_TABLET | Freq: Every day | ORAL | Status: DC

## 2015-06-11 MED ORDER — ETOMIDATE 2 MG/ML IV SOLN
0.1000 mg/kg | Freq: Once | INTRAVENOUS | Status: DC
  Filled 2015-06-11: qty 10

## 2015-06-11 MED ORDER — ETOMIDATE 2 MG/ML IV SOLN
INTRAVENOUS | Status: AC | PRN
Start: 2015-06-11 — End: 2015-06-11
  Administered 2015-06-11: 10 mg via INTRAVENOUS

## 2015-06-11 MED ORDER — DEXTROSE 5 % IV SOLN
5.0000 mg/h | INTRAVENOUS | Status: DC
  Administered 2015-06-11: 5 mg/h via INTRAVENOUS
  Filled 2015-06-11: qty 100

## 2015-06-11 NOTE — Sedation Documentation (Signed)
Shocked at 200J-Dr.hardening

## 2015-06-11 NOTE — CV Procedure (Signed)
   NAME:  Stanley Wells   MRN: [id number] DOB:  12/30/1945   ADMIT DATE: 06/11/2015  Procedure: Electrical Cardioversion Indications:  Atrial Flutter  Procedure Details:  Consent: Risks of procedure as well as the alternatives and risks of each were explained to the (patient/caregiver).  Consent for procedure obtained.  Time Out: Verified patient identification, verified procedure, site/side was marked, verified correct patient position, special equipment/implants available, medications/allergies/relevent history reviewed, required imaging and test results available.  Performed  Patient placed on cardiac monitor, pulse oximetry, supplemental oxygen as necessary.  Sedation given: Etomidate  - Dr. Rubin PayorPickering (EDP) Pacer pads placed anterior chest.  Cardioverted 1 time(s).  Cardioverted at 200J.  Evaluation: Findings: Post procedure EKG shows: Normal sinus rhythm Complications: None Patient did tolerate procedure well.  Time Spent Directly with the Patient:  10 minutes    Signed, Azalee CourseHao Meng PA Pager: [phone]  I was present with [name],PA-C & Dr. Rubin PayorPickering & supervised each step of the process.   Successful DCCV with 1 shock restoring NSR with PACs.    Marykay LexHARDING, DAVID W, M.D., M.S. Interventional Cardiologist   Pager # [phone]

## 2015-06-11 NOTE — Progress Notes (Signed)
RT was on standby during cardioversion.

## 2015-06-11 NOTE — Discharge Instructions (Signed)
Please note your norvasc has been stopped to make more room for additional rate control medication. You will continue 100mg  daily metoprolol XL every morning, but take additional 50mg  daily metoprolol at night.

## 2015-06-11 NOTE — Consult Note (Signed)
Patient ID: Stanley [name]: [id number], DOB/AGE: November 09, 1945   Admit date: 06/11/2015   Primary Physician: [name]., MD Primary Cardiologist: Dr. Tresa Endo  Pt. Profile:  69 year old Caucasian male with past medical history of OSA, HTN, chronic systolic heart failure, PAF on eliquis and a history of CAD s/p CABG x5 2003 by Dr. [name] presented with recurrent aflutter  Problem List  Past Medical History  Diagnosis Date  . CAD (coronary artery disease)     a. s/p CABG 2003  . Paroxysmal atrial flutter (HCC)     a. s/p succesful TEE/DCCV on 06/01/14.  b. on Eliquis  . Chronic systolic CHF (congestive heart failure) (HCC)     a. 2D ECHO: 05/17/2014: EF 30-35%, mild concentric hypertrophy, diffuse hypokinesis, akinesis of the basalinferior myocardium, severe hypokinesis of the mid-apicalinferior myocardium. Aortic sclerosis, mild MR  . Cholecystitis   . HTN (hypertension)     Past Surgical History  Procedure Laterality Date  . Coronary artery bypass graft  2003    x 5  . Tee without cardioversion N/A 06/01/2014    Procedure: TRANSESOPHAGEAL ECHOCARDIOGRAM (TEE);  Surgeon: Lars Masson, MD;  Location: [facility] ENDOSCOPY;  Service: Cardiovascular;  Laterality: N/A;  . Cardioversion N/A 06/01/2014    Procedure: CARDIOVERSION;  Surgeon: Lars Masson, MD;  Location: [facility] ENDOSCOPY;  Service: Cardiovascular;  Laterality: N/A;  . Cardiac catheterization N/A 06/19/2014    Procedure: RIGHT/LEFT HEART CATH AND CORONARY/GRAFT ANGIOGRAPHY;  Surgeon: Lennette Bihari, MD;  Location: [facility] CATH LAB;  Service: Cardiovascular;  Laterality: N/A;     Allergies  No Known Allergies  HPI  The patient is a 69 year old Caucasian male with past medical history of OSA, HTN, chronic systolic heart failure, PAF on eliquis and a history of CAD s/p CABG x5 2003 by Dr. [name]. He first noted to have atrial fibrillation in October 2015 with his PCP noted he was in A. fib/atrial flutter with rapid  ventricular response. He was started during the previous admission in October 2015 with eliquis. Echo obtained at the time showed EF 30-35% with diffuse hypokinesis, mild to moderate tricuspid regurg and moderate severe mitral regurg. He underwent successful TEE DCCV. After cardioversion, he had significantly more energy. He was referred for nuclear stress test on 06/14/2015 which was interpreted as high risk with moderate sized, severe intense partially reversible inferior defect consistent with infarct with peri-infarct ischemia and he also had moderate size, severely intense, reversible anterior defect consistent with moderate anterior ischemia. He had no chest pain that time. However given high risk stress test, he underwent cardiac catheterization on 06/20/2015 which showed patent LIMA to LAD, patent SVG to OM 2, occluded SVG to D1, occluded SVG sequential to PDA/PLA. Medical therapy was recommended. Eliquis was resumed on the following day. He underwent diagnostic polysomnography at [facility] on 09/06/2014 this revealed moderate obstructive and central sleep apnea with events being worse in the supine position. However patient does not wish to pursue CPAP.   According to the patient, he has been fairly active walking up to 5 miles every other day. He also has been compliant with his medication. Recently 5 mg Norvasc was added to his medical regimen. Since starting Norvasc, he has been occasionally experiencing palpitation symptom. Interestingly, he never had true palpitation before with previous atrial fibrillation or atrial flutter, his symptom has always been shortness of breath with exertion. He was ready to go to church on 06/10/2015 and was cleaning up after the dog  when he experienced shortness of breath that's reminiscent of his symptom last year. He tried to walk his usual five-mile exercise regimen this morning, and was unable to complete it due to exertional shortness of breath. He felt he has back  into atrial fibrillation and decided to [facility] and was advised to seek medical attention at the ED.   On arrival to the ED, EKG showed patient was in 2:1 atrial flutter with RVR with T wave inversion in the lateral leads. He was started on diltiazem drip in the ED. According to the patient, he did take his Toprol-XL 100 mg this morning. He has normal CBC and BMP. Chest x-ray is negative for acute process. Cardiology has been consulted for atrial flutter with RVR.   Home Medications  Prior to Admission medications   Medication Sig Start Date End Date Taking? Authorizing Provider  amLODipine (NORVASC) 5 MG tablet Take 1 tablet (5 mg total) by mouth daily. 05/01/15  Yes Lennette Biharihomas A Kelly, MD  apixaban (ELIQUIS) 5 MG TABS tablet Take 1 tablet (5 mg total) by mouth 2 (two) times daily. 06/01/15  Yes Lennette Biharihomas A Kelly, MD  atorvastatin (LIPITOR) 80 MG tablet Take 1 tablet (80 mg total) by mouth daily. 05/22/15  Yes Lennette Biharihomas A Kelly, MD  furosemide (LASIX) 40 MG tablet Take 1 tablet (40 mg total) by mouth daily. 05/01/15  Yes Lennette Biharihomas A Kelly, MD  isosorbide mononitrate (IMDUR) 30 MG 24 hr tablet Take 1 tablet (30 mg total) by mouth daily. 01/02/15  Yes Lennette Biharihomas A Kelly, MD  lisinopril (PRINIVIL,ZESTRIL) 20 MG tablet Take 1.5 tablets (30 mg total) by mouth daily. 05/01/15  Yes Lennette Biharihomas A Kelly, MD  metoprolol succinate (TOPROL-XL) 100 MG 24 hr tablet Take 1 tablet (100 mg total) by mouth daily. 11/16/14  Yes Lennette Biharihomas A Kelly, MD  Multiple Vitamin (MULTIVITAMIN WITH MINERALS) TABS tablet Take 1 tablet by mouth daily.   Yes Historical Provider, MD  RANEXA 500 MG 12 hr tablet take 1 tablet by mouth twice a day 06/04/15  Yes Lennette Biharihomas A Kelly, MD    Family History  Family History  Problem Relation Age of Onset  . Cancer Mother   . Cancer Father     Social History  Social History   Social History  . Marital Status: Married    Spouse Name: N/A  . Number of Children: N/A  . Years of Education: N/A    Occupational History  . Retired    Social History Main Topics  . Smoking status: Former Smoker -- 1.00 packs/day for 40 years    Types: Cigarettes    Quit date: 02/02/2002  . Smokeless tobacco: Never Used  . Alcohol Use: Yes     Comment: 6 pack of beer a week  . Drug Use: No  . Sexual Activity: Not on file   Other Topics Concern  . Not on file   Social History Narrative   [place] in [place].     Review of Systems General:  No chills, fever, night sweats or weight changes.  Cardiovascular:  No chest pain, edema, orthopnea, palpitations, paroxysmal nocturnal dyspnea. +dyspnea on exertion Dermatological: No rash, lesions/masses Respiratory: No cough, dyspnea Urologic: No hematuria, dysuria Abdominal:   No nausea, vomiting, diarrhea, bright red blood per rectum, melena, or hematemesis Neurologic:  No visual changes, changes in mental status. +wkns All other systems reviewed and are otherwise negative except as noted above.  Physical Exam  Blood pressure 93/77, pulse 109, temperature  98.7 F (37.1 C), temperature source Oral, resp. rate 18, weight 217 lb 9 oz (98.686 kg), SpO2 98 %.  General: Pleasant, NAD Psych: Normal affect. Neuro: Alert and oriented X 3. Moves all extremities spontaneously. HEENT: Normal  Neck: Supple without bruits or JVD. Lungs:  Resp regular and unlabored, CTA. Heart: tachycardia. no s3, s4, or murmurs. Abdomen: Soft, non-tender, non-distended, BS + x 4.  Extremities: No clubbing, cyanosis or edema. DP/PT/Radials 2+ and equal bilaterally.  Labs  Troponin (Point of Care Test)  Recent Labs  06/11/15 1124  TROPIPOC 0.03   No results for input(s): CKTOTAL, CKMB, TROPONINI in the last 72 hours. Lab Results  Component Value Date   WBC 12.7* 06/11/2015   HGB 15.3 06/11/2015   HCT 45.0 06/11/2015   MCV 93.5 06/11/2015   PLT 258 06/11/2015    Recent Labs Lab 06/11/15 1102 06/11/15 1126  NA 138 140  K 3.9 3.8  CL 105 103  CO2  23  --   BUN 17 19  CREATININE 1.00 1.00  CALCIUM 9.2  --   GLUCOSE 123* 117*   Lab Results  Component Value Date   CHOL 213* 05/01/2015   HDL 38* 05/01/2015   LDLCALC 126 05/01/2015   TRIG 245* 05/01/2015   No results found for: DDIMER   Radiology/Studies  Dg Chest Portable 1 View  06/11/2015  CLINICAL DATA:  Tachycardia, shortness of breath, atrial fibrillation EXAM: PORTABLE CHEST 1 VIEW COMPARISON:  05/29/2014 FINDINGS: Prior coronary bypass. Stable cardiomegaly without focal pneumonia, collapse or consolidation. No edema, large effusion or pneumothorax. Degenerative changes of the left AC joint. No significant interval change. IMPRESSION: Stable cardiomegaly.  No current CHF or pneumonia. Electronically Signed   By: Judie Petit.  Shick M.D.   On: 06/11/2015 12:20    ECG  Aflutter with RVR with TWI in lateral leads  TEE 06/01/2014  LV EF: 30% -  35%  ------------------------------------------------------------------- History:  PMH:  Atrial fibrillation.  ------------------------------------------------------------------- Study Conclusions  - Left ventricle: The cavity size was mildly dilated. Systolic function was moderately to severely reduced. The estimated ejection fraction was in the range of 30% to 35%. Diffuse hypokinesis. No evidence of thrombus. - Aortic valve: Moderately thickened, moderately calcified leaflets. There was no stenosis. There was no regurgitation. - Aorta: Mild plaque. - Mitral valve: No evidence of vegetation. There was moderate to severe regurgitation with systolic reversal of flow in pulmonary veins. - Left atrium: The atrium was dilated. No evidence of thrombus in the atrial cavity or appendage. No evidence of thrombus in the atrial cavity or appendage. Emptying velocity was moderately reduced. - Right ventricle: Systolic function was mildly reduced. - Right atrium: No evidence of thrombus in the atrial cavity  or appendage. - Tricuspid valve: There was mild-moderate regurgitation. - Pulmonic valve: No evidence of vegetation. - Pericardium, extracardiac: There was no pericardial effusion.  Impressions:  - Successful cardioversion. No cardiac source of emboli was indentified.    ASSESSMENT AND PLAN  1. Paroxysmal atrial flutter with RVR on eliqus  - h/o afib/aflutter s/p DCCV 06/01/2014  - recurrence of exertional SOB on 06/10/2015, no cardiac awareness. Has been compliant with eliquis  - 2 options are admit to obes and DCCV tomorrow vs DCCV in the ED. He is a good candidate for DCCV in ED  - ideally need to control OSA, however he does not want CPAP.   - may need to stop norvasc on discharge and change Toprol XL to 100 mg BID or   in AM and  in PM  2. history of CAD s/p CABG x5 2003 by Dr. [name]  - cath 06/20/2015 patent LIMA to LAD, patent SVG to OM 2, occluded SVG to D1, occluded SVG sequential to PDA/PLA. Medical therapy was recommended.  3. Chronic systolic heart failure with EF 30-35% on echo 10/25: euvolemic on exam  4. OSA not on CPAP  5. Moderately severe MR on echo 05/2014  6. HTN   Signed, Azalee Course, [place] 06/11/2015, 2:36 PM   I have seen, examined and evaluated the patient this PM in the ER along with Mr. Lisabeth Devoid, [place].  After reviewing all the available data and chart,  I agree with his findings, examination as well as impression recommendations.  Recent onset Atrial Flutter with RVR - symptomatic.  No CHF or Angina Sx.  If notes persistent DOE after DCCV - would dose extra dose of Lasix. Is on standing dose of NOAC.    Agree with plan for DCCV in ER with EDP followed by increase of BB dose to total 150 mg/day Hold CCB until seen in close f/u already arranged with Mr. Leron Croak for next week.  If stable post DCCV, can d/c from ER with close f/u.  ---   ADDENDUM - See PV Procedure note for DCCV - 1 shock @ 200 J, Etomidate for sedation. Back in NSR with  occasional PACs & PVCs.  Rate in 70s.  Marykay Lex, M.D., M.S. Interventional Cardiologist   Pager # [phone]   ]

## 2015-06-11 NOTE — ED Notes (Signed)
Pt here for increased SOB and HR of 130. Hx pf afib. Sts more SOB than normal.

## 2015-06-11 NOTE — Sedation Documentation (Signed)
Family updated as to patient's status.Wife out side of room in chair.

## 2015-06-11 NOTE — ED Notes (Signed)
MD at bedside.-[place] 

## 2015-06-11 NOTE — ED Provider Notes (Addendum)
CSN: [id number]     Arrival date & time 06/11/15  1018 History   First MD Initiated Contact with Patient 06/11/15 1115     Chief Complaint  Patient presents with  . Shortness of Breath     (Consider location/radiation/quality/duration/timing/severity/associated sxs/prior Treatment) Patient is a 69 y.o. male presenting with shortness of breath.  Shortness of Breath Associated symptoms: no abdominal pain, no chest pain, no headaches, no rash and no vomiting    patient has had shortness of breath the last day and a half. Has a history of proximal muscle A flutter. Has a history of CHF also. States he has felt for the last 3 months he has had his heart racing at times. He is on anticoagulation. No fevers. No cough. No swelling in his legs. He just has some shortness of breath with it. Upon arrival found to be in atrial flutter with RVR.  Past Medical History  Diagnosis Date  . CAD (coronary artery disease)     a. s/p CABG 2003  . Paroxysmal atrial flutter (HCC)     a. s/p succesful TEE/DCCV on 06/01/14.  b. on Eliquis  . Chronic systolic CHF (congestive heart failure) (HCC)     a. 2D ECHO: 05/17/2014: EF 30-35%, mild concentric hypertrophy, diffuse hypokinesis, akinesis of the basalinferior myocardium, severe hypokinesis of the mid-apicalinferior myocardium. Aortic sclerosis, mild MR  . Cholecystitis   . HTN (hypertension)    Past Surgical History  Procedure Laterality Date  . Coronary artery bypass graft  2003    x 5  . Tee without cardioversion N/A 06/01/2014    Procedure: TRANSESOPHAGEAL ECHOCARDIOGRAM (TEE);  Surgeon: Lars MassonKatarina H Nelson, MD;  [facility];  Service: Cardiovascular;  Laterality: N/A;  . Cardioversion N/A 06/01/2014    Procedure: CARDIOVERSION;  Surgeon: Lars MassonKatarina H Nelson, MD;  [facility];  Service: Cardiovascular;  Laterality: N/A;  . Cardiac catheterization N/A 06/19/2014    Procedure: RIGHT/LEFT HEART CATH AND CORONARY/GRAFT ANGIOGRAPHY;   Surgeon: Lennette Biharihomas A Kelly, MD;  [facility];  Service: Cardiovascular;  Laterality: N/A;   Family History  Problem Relation Age of Onset  . Cancer Mother   . Cancer Father    Social History  Substance Use Topics  . Smoking status: Former Smoker -- 1.00 packs/day for 40 years    Types: Cigarettes    Quit date: 02/02/2002  . Smokeless tobacco: Never Used  . Alcohol Use: Yes     Comment: 6 pack of beer a week    Review of Systems  Constitutional: Negative for activity change and appetite change.  Eyes: Negative for pain.  Respiratory: Positive for shortness of breath. Negative for chest tightness.   Cardiovascular: Positive for palpitations. Negative for chest pain and leg swelling.  Gastrointestinal: Negative for nausea, vomiting, abdominal pain and diarrhea.  Genitourinary: Negative for flank pain.  Musculoskeletal: Negative for back pain and neck stiffness.  Skin: Negative for rash.  Neurological: Negative for weakness, numbness and headaches.  Psychiatric/Behavioral: Negative for behavioral problems.      Allergies  Review of patient's allergies indicates no known allergies.  Home Medications   Prior to Admission medications   Medication Sig Start Date End Date Taking? Authorizing Provider  apixaban (ELIQUIS) 5 MG TABS tablet Take 1 tablet (5 mg total) by mouth 2 (two) times daily. 06/01/15  Yes Lennette Biharihomas A Kelly, MD  atorvastatin (LIPITOR) 80 MG tablet Take 1 tablet (80 mg total) by mouth daily. 05/22/15  Yes Lennette Biharihomas A Kelly, MD  furosemide (LASIX) 40 MG tablet Take 1 tablet (40 mg total) by mouth daily. 05/01/15  Yes Lennette Bihari, MD  isosorbide mononitrate (IMDUR) 30 MG 24 hr tablet Take 1 tablet (30 mg total) by mouth daily. 01/02/15  Yes Lennette Bihari, MD  lisinopril (PRINIVIL,ZESTRIL) 20 MG tablet Take 1.5 tablets (30 mg total) by mouth daily. 05/01/15  Yes Lennette Bihari, MD  metoprolol succinate (TOPROL-XL) 100 MG 24 hr tablet Take 1 tablet (100 mg total) by  mouth daily. 11/16/14  Yes Lennette Bihari, MD  Multiple Vitamin (MULTIVITAMIN WITH MINERALS) TABS tablet Take 1 tablet by mouth daily.   Yes Historical Provider, MD  RANEXA 500 MG 12 hr tablet take 1 tablet by mouth twice a day 06/04/15  Yes Lennette Bihari, MD  metoprolol succinate (TOPROL XL) 50 MG 24 hr tablet Take 1 tablet (50 mg total) by mouth daily. Take  in AM and this  tablet in PM daily 06/11/15   Benjiman Core, MD   BP 108/81 mmHg  Pulse 76  Temp(Src) 98.7 F (37.1 C) (Oral)  Resp 24  Wt 217 lb 9 oz (98.686 kg)  SpO2 98% Physical Exam  Constitutional: He appears well-developed and well-nourished.  HENT:  Head: Atraumatic.  Eyes: EOM are normal.  Neck: Neck supple.  Cardiovascular:  Irregular tachycardia  Pulmonary/Chest: Effort normal.  Abdominal: Soft. There is no tenderness.  Musculoskeletal: Normal range of motion.  Neurological: He is alert.  Skin: Skin is warm.    ED Course  .Sedation Date/Time: 06/11/2015 3:00 PM Performed by: Benjiman Core Authorized by: Benjiman Core  Consent:    Consent obtained:  Written   Consent given by:  Patient   Risks discussed:  Prolonged hypoxia resulting in organ damage, respiratory compromise necessitating ventilatory assistance and intubation, nausea, vomiting and inadequate sedation Indications:    Sedation purpose:  Cardioversion   Procedure necessitating sedation performed by:  Different physician   Intended level of sedation:  Moderate (conscious sedation) Pre-sedation assessment:    Time since last food or drink:  4am   ASA classification: class 2 - patient with mild systemic disease     Neck mobility: normal     Mouth opening:  2 finger widths   Thyromental distance:  3 finger widths   Mallampati score:  III - soft palate, base of uvula visible   Pre-sedation assessments completed and reviewed: airway patency, cardiovascular function and mental status   Immediate pre-procedure details:     Reassessment: Patient reassessed immediately prior to procedure   Procedure details (see MAR for exact dosages):    Preoxygenation:  Nasal cannula   Sedation:  Etomidate   Intra-procedure monitoring:  Blood pressure monitoring   Intra-procedure events: none     Total sedation time (minutes):  10 Post-procedure details:    Attendance: Constant attendance by certified staff until patient recovered     Recovery: Patient returned to pre-procedure baseline     Post-sedation assessments completed and reviewed: cardiovascular function, mental status and respiratory function     Patient is stable for discharge or admission: Yes     Patient tolerance:  Tolerated well, no immediate complications  (including critical care time) Labs Review Labs Reviewed  BASIC METABOLIC PANEL - Abnormal; Notable for the following:    Glucose, Bld 123 (*)    All other components within normal limits  CBC - Abnormal; Notable for the following:    WBC 12.7 (*)    All other components within normal  limits  I-STAT CHEM 8, ED - Abnormal; Notable for the following:    Glucose, Bld 117 (*)    All other components within normal limits  I-STAT TROPOININ, ED    Imaging Review Dg Chest Portable 1 View  06/11/2015  CLINICAL DATA:  Tachycardia, shortness of breath, atrial fibrillation EXAM: PORTABLE CHEST 1 VIEW COMPARISON:  05/29/2014 FINDINGS: Prior coronary bypass. Stable cardiomegaly without focal pneumonia, collapse or consolidation. No edema, large effusion or pneumothorax. Degenerative changes of the left AC joint. No significant interval change. IMPRESSION: Stable cardiomegaly.  No current CHF or pneumonia. Electronically Signed   By: Judie Petit.  Shick M.D.   On: 06/11/2015 12:20   I have personally reviewed and evaluated these images and lab results as part of my medical decision-making.   EKG Interpretation   Date/Time:  Monday June 11 2015 10:48:20 EST Ventricular Rate:  133 PR Interval:  128 QRS Duration:  128 QT Interval:  310 QTC Calculation: 461 R Axis:   88 Text Interpretation:  Atrial flutter Non-specific intra-ventricular  conduction block Cannot rule out Anterior infarct , age undetermined T  wave abnormality, consider lateral ischemia Abnormal ECG Confirmed by  Rubin Payor  MD, Harrold Donath [phone]) on 06/11/2015 12:11:17 PM      MDM   Final diagnoses:  Atrial flutter with rapid ventricular response (HCC)    Patient with atrial flutter with RVR. Likely onset within the last 48 hours. Already on anticoagulation. Discussed with cardiology and cardioversion done in the ER. Had been on Cardizem drip. Maintain sinus rhythm and will be discharged home. CRITICAL CARE Performed by: Billee Cashing Total critical care time:30 minutes Critical care time was exclusive of separately billable procedures and treating other patients. Critical care was necessary to treat or prevent imminent or life-threatening deterioration. Critical care was time spent personally by me on the following activities: development of treatment plan with patient and/or surrogate as well as nursing, discussions with consultants, evaluation of patient's response to treatment, examination of patient, obtaining history from patient or surrogate, ordering and performing treatments and interventions, ordering and review of laboratory studies, ordering and review of radiographic studies, pulse oximetry and re-evaluation of patient's condition.        Benjiman Core, MD 06/11/15 1646  Cardioversion done by cardiology while I did the sedation.    Benjiman Core, MD 07/02/15 [phone]

## 2015-06-11 NOTE — Telephone Encounter (Signed)
Returned call to patient's wife. Patient has elevated BP of 155/104 and HR of 133. Patient is short of breath and "feels a lot like he did last fall when he had to go to the hospital" per wife. Advised with high HR, h/o aflutter with RVR, and shortness of breath, patient should go to ED for eval. Wife agreed and voiced understanding.

## 2015-06-11 NOTE — ED Notes (Signed)
PA with cards in with pt.

## 2015-06-11 NOTE — ED Notes (Signed)
Consent signed and at bedside  

## 2015-06-11 NOTE — Telephone Encounter (Signed)
[name] is having shortness of breath this morning , please call   Thanks

## 2015-06-13 ENCOUNTER — Encounter (HOSPITAL_COMMUNITY): Payer: Self-pay | Admitting: Emergency Medicine

## 2015-06-13 ENCOUNTER — Emergency Department (HOSPITAL_COMMUNITY): Payer: Medicare Other

## 2015-06-13 ENCOUNTER — Emergency Department (HOSPITAL_COMMUNITY)
Admission: EM | Admit: 2015-06-13 | Discharge: 2015-06-13 | Disposition: A | Discharge status: Home / Self Care | Payer: Medicare Other | Attending: Emergency Medicine | Admitting: Emergency Medicine

## 2015-06-13 DIAGNOSIS — J159 Unspecified bacterial pneumonia: Secondary | ICD-10-CM | POA: Diagnosis not present

## 2015-06-13 DIAGNOSIS — J189 Pneumonia, unspecified organism: Secondary | ICD-10-CM | POA: Diagnosis not present

## 2015-06-13 DIAGNOSIS — J90 Pleural effusion, not elsewhere classified: Secondary | ICD-10-CM | POA: Diagnosis not present

## 2015-06-13 DIAGNOSIS — Z79899 Other long term (current) drug therapy: Secondary | ICD-10-CM | POA: Diagnosis not present

## 2015-06-13 DIAGNOSIS — I5022 Chronic systolic (congestive) heart failure: Secondary | ICD-10-CM | POA: Diagnosis not present

## 2015-06-13 DIAGNOSIS — I2510 Atherosclerotic heart disease of native coronary artery without angina pectoris: Secondary | ICD-10-CM | POA: Diagnosis not present

## 2015-06-13 DIAGNOSIS — R0602 Shortness of breath: Secondary | ICD-10-CM | POA: Diagnosis present

## 2015-06-13 DIAGNOSIS — Z87891 Personal history of nicotine dependence: Secondary | ICD-10-CM | POA: Insufficient documentation

## 2015-06-13 DIAGNOSIS — I10 Essential (primary) hypertension: Secondary | ICD-10-CM | POA: Diagnosis not present

## 2015-06-13 LAB — I-STAT TROPONIN, ED: Troponin i, poc: 0.03 ng/mL (ref 0.00–0.08)

## 2015-06-13 LAB — BASIC METABOLIC PANEL
ANION GAP: 9 (ref 5–15)
BUN: 26 mg/dL — ABNORMAL HIGH (ref 6–20)
CALCIUM: 8.9 mg/dL (ref 8.9–10.3)
CO2: 22 mmol/L (ref 22–32)
CREATININE: 1.24 mg/dL (ref 0.61–1.24)
Chloride: 106 mmol/L (ref 101–111)
GFR, EST NON AFRICAN AMERICAN: 58 mL/min — AB (ref >60)
Glucose, Bld: 198 mg/dL — ABNORMAL HIGH (ref 65–99)
Potassium: 4.1 mmol/L (ref 3.5–5.1)
SODIUM: 137 mmol/L (ref 135–145)

## 2015-06-13 LAB — CBC
HCT: 40.9 % (ref 39.0–52.0)
Hemoglobin: 13.1 g/dL (ref 13.0–17.0)
MCH: 30.5 pg (ref 26.0–34.0)
MCHC: 32 g/dL (ref 30.0–36.0)
MCV: 95.3 fL (ref 78.0–100.0)
PLATELETS: 232 10*3/uL (ref 150–400)
RBC: 4.29 MIL/uL (ref 4.22–5.81)
RDW: 14.5 % (ref 11.5–15.5)
WBC: 12.5 10*3/uL — ABNORMAL HIGH (ref 4.0–10.5)

## 2015-06-13 LAB — BRAIN NATRIURETIC PEPTIDE: B Natriuretic Peptide: 634.7 pg/mL — ABNORMAL HIGH (ref 0.0–100.0)

## 2015-06-13 LAB — PROTIME-INR
INR: 1.32 (ref 0.00–1.49)
Prothrombin Time: 16.6 seconds — ABNORMAL HIGH (ref 11.6–15.2)

## 2015-06-13 LAB — D-DIMER, QUANTITATIVE: D-Dimer, Quant: 0.43 ug/mL-FEU (ref 0.00–0.48)

## 2015-06-13 MED ORDER — LEVOFLOXACIN 500 MG PO TABS
500.0000 mg | ORAL_TABLET | Freq: Once | ORAL | Status: AC
  Administered 2015-06-13: 500 mg via ORAL
  Filled 2015-06-13: qty 1

## 2015-06-13 MED ORDER — AZITHROMYCIN 250 MG PO TABS: 500.0000 mg | ORAL_TABLET | Freq: Once | ORAL | Status: DC

## 2015-06-13 MED ORDER — LEVOFLOXACIN 500 MG PO TABS: 500.0000 mg | ORAL_TABLET | Freq: Every day | ORAL | Status: DC

## 2015-06-13 MED ORDER — DEXTROSE 5 % IV SOLN: 1.0000 g | Freq: Once | INTRAVENOUS | Status: DC

## 2015-06-13 MED ORDER — ALBUTEROL SULFATE HFA 108 (90 BASE) MCG/ACT IN AERS
2.0000 | INHALATION_SPRAY | RESPIRATORY_TRACT | Status: DC | PRN
  Administered 2015-06-13: 2 via RESPIRATORY_TRACT
  Filled 2015-06-13: qty 6.7

## 2015-06-13 NOTE — ED Provider Notes (Signed)
CSN: [id number]     Arrival date & time 06/13/15  0630 History   First MD Initiated Contact with Patient 06/13/15 0700     Chief Complaint  Patient presents with  . Shortness of Breath     (Consider location/radiation/quality/duration/timing/severity/associated sxs/prior Treatment) HPI  Pt presenting with c/o shortness of breath.  Pt was in the ED 2 days ago in rapid afib and was cardioverted in the ED- he was stable after and discharged home.  He states that since that time he has been feeling short of breath with exertion "just about as bad as when I came in here 2 days ago" .  No chest pain.  He feels that his heart is racing again.  No syncope.  He takes eliquis.  No leg swelling.  No fever/chills.  There are no other associated systemic symptoms, there are no other alleviating or modifying factors.   Past Medical History  Diagnosis Date  . CAD (coronary artery disease)     a. s/p CABG 2003  . Paroxysmal atrial flutter (HCC)     a. s/p succesful TEE/DCCV on 06/01/14.  b. on Eliquis  . Chronic systolic CHF (congestive heart failure) (HCC)     a. 2D ECHO: 05/17/2014: EF 30-35%, mild concentric hypertrophy, diffuse hypokinesis, akinesis of the basalinferior myocardium, severe hypokinesis of the mid-apicalinferior myocardium. Aortic sclerosis, mild MR  . Cholecystitis   . HTN (hypertension)    Past Surgical History  Procedure Laterality Date  . Coronary artery bypass graft  2003    x 5  . Tee without cardioversion N/A 06/01/2014    Procedure: TRANSESOPHAGEAL ECHOCARDIOGRAM (TEE);  Surgeon: Lars Masson, MD;  Location: [facility] ENDOSCOPY;  Service: Cardiovascular;  Laterality: N/A;  . Cardioversion N/A 06/01/2014    Procedure: CARDIOVERSION;  Surgeon: Lars Masson, MD;  Location: [facility] ENDOSCOPY;  Service: Cardiovascular;  Laterality: N/A;  . Cardiac catheterization N/A 06/19/2014    Procedure: RIGHT/LEFT HEART CATH AND CORONARY/GRAFT ANGIOGRAPHY;  Surgeon: Lennette Bihari, MD;   Location: [facility] CATH LAB;  Service: Cardiovascular;  Laterality: N/A;   Family History  Problem Relation Age of Onset  . Cancer Mother   . Cancer Father    Social History  Substance Use Topics  . Smoking status: Former Smoker -- 1.00 packs/day for 40 years    Types: Cigarettes    Quit date: 02/02/2002  . Smokeless tobacco: Never Used  . Alcohol Use: Yes     Comment: 6 pack of beer a week    Review of Systems  ROS reviewed and all otherwise negative except for mentioned in HPI    Allergies  Review of patient's allergies indicates no known allergies.  Home Medications   Prior to Admission medications   Medication Sig Start Date End Date Taking? Authorizing Provider  apixaban (ELIQUIS) 5 MG TABS tablet Take 1 tablet (5 mg total) by mouth 2 (two) times daily. 06/01/15  Yes Lennette Bihari, MD  atorvastatin (LIPITOR) 80 MG tablet Take 1 tablet (80 mg total) by mouth daily. 05/22/15  Yes Lennette Bihari, MD  furosemide (LASIX) 40 MG tablet Take 1 tablet (40 mg total) by mouth daily. 05/01/15  Yes Lennette Bihari, MD  isosorbide mononitrate (IMDUR) 30 MG 24 hr tablet Take 1 tablet (30 mg total) by mouth daily. 01/02/15  Yes Lennette Bihari, MD  lisinopril (PRINIVIL,ZESTRIL) 20 MG tablet Take 1.5 tablets (30 mg total) by mouth daily. 05/01/15  Yes Lennette Bihari, MD  metoprolol succinate (  TOPROL XL) 50 MG 24 hr tablet Take 1 tablet (50 mg total) by mouth daily. Take 100mg  in AM and this 50mg  tablet in PM daily 06/11/15  Yes Benjiman CoreNathan Pickering, MD  Multiple Vitamin (MULTIVITAMIN WITH MINERALS) TABS tablet Take 1 tablet by mouth daily.   Yes Historical Provider, MD  RANEXA 500 MG 12 hr tablet take 1 tablet by mouth twice a day 06/04/15  Yes Lennette Biharihomas A Kelly, MD  levofloxacin (LEVAQUIN) 500 MG tablet Take 1 tablet (500 mg total) by mouth daily. 06/13/15   Jerelyn ScottMartha Linker, MD  metoprolol succinate (TOPROL-XL) 100 MG 24 hr tablet Take 1 tablet (100 mg total) by mouth daily. Patient not taking: Reported on  06/13/2015 11/16/14   Lennette Biharihomas A Kelly, MD   BP 127/98 mmHg  Pulse 85  Temp(Src) 97.6 F (36.4 C) (Oral)  Resp 23  Ht 5\' 11"  (1.803 m)  Wt 212 lb (96.163 kg)  BMI 29.58 kg/m2  SpO2 95%  Vitals reviewed Physical Exam  Physical Examination: General appearance - alert, well appearing, and in no distress Mental status - alert, oriented to person, place, and time Eyes - no conjunctival injection no scleral icterus Mouth - mucous membranes moist, pharynx normal without lesions Chest - clear to auscultation, no wheezes, rales or rhonchi, symmetric air entry Heart - normal rate, regular rhythm, normal S1, S2, no murmurs, rubs, clicks or gallops Abdomen - soft, nontender, nondistended, no masses or organomegaly Neurological - alert, oriented, normal speech,  Extremities - peripheral pulses normal, no pedal edema, no clubbing or cyanosis Skin - normal coloration and turgor, no rashes  ED Course  Procedures (including critical care time) Labs Review Labs Reviewed  BASIC METABOLIC PANEL - Abnormal; Notable for the following:    Glucose, Bld 198 (*)    BUN 26 (*)    GFR calc non Af Amer 58 (*)    All other components within normal limits  CBC - Abnormal; Notable for the following:    WBC 12.5 (*)    All other components within normal limits  PROTIME-INR - Abnormal; Notable for the following:    Prothrombin Time 16.6 (*)    All other components within normal limits  BRAIN NATRIURETIC PEPTIDE - Abnormal; Notable for the following:    B Natriuretic Peptide 634.7 (*)    All other components within normal limits  D-DIMER, QUANTITATIVE (NOT AT [facility])  Rosezena SensorI-STAT TROPOININ, ED    Imaging Review Dg Chest 2 View  06/13/2015  CLINICAL DATA:  Short of breath EXAM: CHEST  2 VIEW COMPARISON:  06/11/2015 FINDINGS: Sternotomy wires overlie normal cardiac silhouette. Increased linear opacities at the lung bases. Small bilateral effusions. No pneumothorax. Hyperinflated lungs. IMPRESSION: Increased linear  opacities lung bases suggests atelectasis, pneumonia, or bronchitis. Pneumonia less favored. Small effusions. Electronically Signed   By: Genevive BiStewart  Edmunds M.D.   On: 06/13/2015 07:03   Dg Chest Portable 1 View  06/11/2015  CLINICAL DATA:  Tachycardia, shortness of breath, atrial fibrillation EXAM: PORTABLE CHEST 1 VIEW COMPARISON:  05/29/2014 FINDINGS: Prior coronary bypass. Stable cardiomegaly without focal pneumonia, collapse or consolidation. No edema, large effusion or pneumothorax. Degenerative changes of the left AC joint. No significant interval change. IMPRESSION: Stable cardiomegaly.  No current CHF or pneumonia. Electronically Signed   By: Judie PetitM.  Shick M.D.   On: 06/11/2015 12:20   I have personally reviewed and evaluated these images and lab results as part of my medical decision-making.   EKG Interpretation   Date/Time:  Wednesday June 13 2015 06:37:21 EST Ventricular Rate:  93 PR Interval:  180 QRS Duration: 112 QT Interval:  366 QTC Calculation: 455 R Axis:   63 Text Interpretation:  Sinus rhythm Probable left atrial enlargement  Anteroseptal infarct, age indeterminate no significant change since Jun 11 2015 Confirmed by Criss Alvine  MD, SCOTT [phone]) on 06/13/2015 6:42:16 AM      MDM   Final diagnoses:  Community acquired pneumonia    Pt presenting with c/o shortness of breath- symptoms have been off and on the for the past 2 days.  Pt was cardioverted from rapid afib to sinus 2 days ago.  He remains in sinus rhythm in the ED today- HR 80s.  CXR shows area of atelectasis/bronchitis/possible pneumonia.  Troponin, d-dimer reassuring- doubt ACS or PE.  BNP is mildly elevated with hx of CHF- no florid pulmonary edema.   Pt on lasix.  Will give albuterol MDI as well as course of levaquin for possible pneumonia.  Discharged with strict return precautions.  Pt agreeable with plan.    Jerelyn Scott, MD 06/13/15 [phone]

## 2015-06-13 NOTE — ED Notes (Signed)
Pt in from home reporting SOB since Monday. Pt was here and was cardioverted. Stated feels like his heart is racing again

## 2015-06-13 NOTE — ED Notes (Signed)
Pt to CXR.

## 2015-06-13 NOTE — ED Notes (Signed)
Pt verbalizes understanding of discharge instructions. NAD on departure. VSS. Wheeled to waiting room to await transportation home safely by family.

## 2015-06-13 NOTE — Discharge Instructions (Signed)
Return to the ED with any concerns including difficulty breathing despite using albuterol every 4 hours, not drinking fluids, decreased urine output, vomiting and not able to keep down liquids or medications, decreased level of alertness/lethargy, or any other alarming symptoms °

## 2015-06-22 ENCOUNTER — Ambulatory Visit: Payer: Medicare Other | Admitting: Physician Assistant

## 2015-07-05 ENCOUNTER — Observation Stay (HOSPITAL_COMMUNITY)
Admission: EM | Admit: 2015-07-05 | Discharge: 2015-07-07 | Disposition: A | Discharge status: Home / Self Care | Payer: Medicare Other | Attending: Cardiology | Admitting: Cardiology

## 2015-07-05 ENCOUNTER — Emergency Department (HOSPITAL_COMMUNITY): Payer: Medicare Other

## 2015-07-05 ENCOUNTER — Encounter (HOSPITAL_COMMUNITY): Payer: Self-pay

## 2015-07-05 DIAGNOSIS — Z7901 Long term (current) use of anticoagulants: Secondary | ICD-10-CM | POA: Insufficient documentation

## 2015-07-05 DIAGNOSIS — I5042 Chronic combined systolic (congestive) and diastolic (congestive) heart failure: Secondary | ICD-10-CM | POA: Diagnosis not present

## 2015-07-05 DIAGNOSIS — I480 Paroxysmal atrial fibrillation: Secondary | ICD-10-CM | POA: Diagnosis not present

## 2015-07-05 DIAGNOSIS — Z79899 Other long term (current) drug therapy: Secondary | ICD-10-CM | POA: Insufficient documentation

## 2015-07-05 DIAGNOSIS — D72829 Elevated white blood cell count, unspecified: Secondary | ICD-10-CM | POA: Diagnosis not present

## 2015-07-05 DIAGNOSIS — R002 Palpitations: Secondary | ICD-10-CM | POA: Diagnosis present

## 2015-07-05 DIAGNOSIS — R748 Abnormal levels of other serum enzymes: Secondary | ICD-10-CM | POA: Insufficient documentation

## 2015-07-05 DIAGNOSIS — I10 Essential (primary) hypertension: Secondary | ICD-10-CM

## 2015-07-05 DIAGNOSIS — R7989 Other specified abnormal findings of blood chemistry: Secondary | ICD-10-CM

## 2015-07-05 DIAGNOSIS — G4733 Obstructive sleep apnea (adult) (pediatric): Secondary | ICD-10-CM | POA: Diagnosis not present

## 2015-07-05 DIAGNOSIS — R778 Other specified abnormalities of plasma proteins: Secondary | ICD-10-CM

## 2015-07-05 DIAGNOSIS — R0600 Dyspnea, unspecified: Secondary | ICD-10-CM | POA: Diagnosis not present

## 2015-07-05 DIAGNOSIS — I959 Hypotension, unspecified: Secondary | ICD-10-CM | POA: Insufficient documentation

## 2015-07-05 DIAGNOSIS — I340 Nonrheumatic mitral (valve) insufficiency: Secondary | ICD-10-CM | POA: Insufficient documentation

## 2015-07-05 DIAGNOSIS — I4891 Unspecified atrial fibrillation: Secondary | ICD-10-CM

## 2015-07-05 DIAGNOSIS — Z951 Presence of aortocoronary bypass graft: Secondary | ICD-10-CM | POA: Insufficient documentation

## 2015-07-05 DIAGNOSIS — Z9114 Patient's other noncompliance with medication regimen: Secondary | ICD-10-CM | POA: Insufficient documentation

## 2015-07-05 DIAGNOSIS — I255 Ischemic cardiomyopathy: Secondary | ICD-10-CM | POA: Diagnosis not present

## 2015-07-05 DIAGNOSIS — I4892 Unspecified atrial flutter: Secondary | ICD-10-CM | POA: Insufficient documentation

## 2015-07-05 DIAGNOSIS — Z87891 Personal history of nicotine dependence: Secondary | ICD-10-CM | POA: Diagnosis not present

## 2015-07-05 DIAGNOSIS — Z7982 Long term (current) use of aspirin: Secondary | ICD-10-CM | POA: Insufficient documentation

## 2015-07-05 DIAGNOSIS — I2510 Atherosclerotic heart disease of native coronary artery without angina pectoris: Secondary | ICD-10-CM | POA: Diagnosis not present

## 2015-07-05 DIAGNOSIS — I110 Hypertensive heart disease with heart failure: Secondary | ICD-10-CM | POA: Diagnosis not present

## 2015-07-05 DIAGNOSIS — N179 Acute kidney failure, unspecified: Secondary | ICD-10-CM | POA: Diagnosis not present

## 2015-07-05 DIAGNOSIS — E785 Hyperlipidemia, unspecified: Secondary | ICD-10-CM | POA: Diagnosis not present

## 2015-07-05 DIAGNOSIS — Z91148 Patient's other noncompliance with medication regimen for other reason: Secondary | ICD-10-CM

## 2015-07-05 LAB — BASIC METABOLIC PANEL
Anion gap: 10 (ref 5–15)
BUN: 19 mg/dL (ref 6–20)
CO2: 22 mmol/L (ref 22–32)
Calcium: 9.2 mg/dL (ref 8.9–10.3)
Chloride: 105 mmol/L (ref 101–111)
Creatinine, Ser: 1.06 mg/dL (ref 0.61–1.24)
GFR calc Af Amer: >60 mL/min (ref >60)
GFR calc non Af Amer: >60 mL/min (ref >60)
Glucose, Bld: 136 mg/dL — ABNORMAL HIGH (ref 65–99)
Potassium: 4.4 mmol/L (ref 3.5–5.1)
Sodium: 137 mmol/L (ref 135–145)

## 2015-07-05 LAB — CBC WITH DIFFERENTIAL/PLATELET
Basophils Absolute: 0 10*3/uL (ref 0.0–0.1)
Basophils Relative: 0 %
Eosinophils Absolute: 0.1 10*3/uL (ref 0.0–0.7)
Eosinophils Relative: 0 %
HCT: 45.3 % (ref 39.0–52.0)
Hemoglobin: 14.5 g/dL (ref 13.0–17.0)
Lymphocytes Relative: 12 %
Lymphs Abs: 2 10*3/uL (ref 0.7–4.0)
MCH: 29.4 pg (ref 26.0–34.0)
MCHC: 32 g/dL (ref 30.0–36.0)
MCV: 91.7 fL (ref 78.0–100.0)
Monocytes Absolute: 1.4 10*3/uL — ABNORMAL HIGH (ref 0.1–1.0)
Monocytes Relative: 8 %
Neutro Abs: 13.2 10*3/uL — ABNORMAL HIGH (ref 1.7–7.7)
Neutrophils Relative %: 79 %
Platelets: 256 10*3/uL (ref 150–400)
RBC: 4.94 MIL/uL (ref 4.22–5.81)
RDW: 14.4 % (ref 11.5–15.5)
WBC: 16.7 10*3/uL — ABNORMAL HIGH (ref 4.0–10.5)

## 2015-07-05 LAB — TROPONIN I
TROPONIN I: 0.04 ng/mL — AB (ref <0.031)
TROPONIN I: 0.03 ng/mL (ref <0.031)
Troponin I: 0.04 ng/mL — ABNORMAL HIGH (ref <0.031)

## 2015-07-05 LAB — MAGNESIUM: Magnesium: 2.1 mg/dL (ref 1.7–2.4)

## 2015-07-05 LAB — BRAIN NATRIURETIC PEPTIDE: B Natriuretic Peptide: 873.7 pg/mL — ABNORMAL HIGH (ref 0.0–100.0)

## 2015-07-05 MED ORDER — ISOSORBIDE MONONITRATE ER 30 MG PO TB24
30.0000 mg | ORAL_TABLET | Freq: Every day | ORAL | Status: DC
  Administered 2015-07-06 – 2015-07-07 (×2): 30 mg via ORAL
  Filled 2015-07-05 (×2): qty 1

## 2015-07-05 MED ORDER — DILTIAZEM HCL 100 MG IV SOLR
5.0000 mg/h | Freq: Once | INTRAVENOUS | Status: AC
  Administered 2015-07-05: 5 mg/h via INTRAVENOUS
  Filled 2015-07-05: qty 100

## 2015-07-05 MED ORDER — DILTIAZEM HCL 100 MG IV SOLR
5.0000 mg/h | INTRAVENOUS | Status: DC
  Administered 2015-07-05 (×2): 12.5 mg/h via INTRAVENOUS
  Filled 2015-07-05 (×2): qty 100

## 2015-07-05 MED ORDER — ADULT MULTIVITAMIN W/MINERALS CH
1.0000 | ORAL_TABLET | Freq: Every day | ORAL | Status: DC
  Administered 2015-07-05 – 2015-07-07 (×3): 1 via ORAL
  Filled 2015-07-05 (×3): qty 1

## 2015-07-05 MED ORDER — DILTIAZEM HCL 25 MG/5ML IV SOLN
25.0000 mg | Freq: Once | INTRAVENOUS | Status: AC
  Administered 2015-07-05: 25 mg via INTRAVENOUS

## 2015-07-05 MED ORDER — RANOLAZINE ER 500 MG PO TB12
500.0000 mg | ORAL_TABLET | Freq: Two times a day (BID) | ORAL | Status: DC
  Administered 2015-07-05 – 2015-07-07 (×4): 500 mg via ORAL
  Filled 2015-07-05 (×4): qty 1

## 2015-07-05 MED ORDER — LISINOPRIL 10 MG PO TABS
10.0000 mg | ORAL_TABLET | Freq: Every day | ORAL | Status: DC
  Administered 2015-07-05: 10 mg via ORAL
  Filled 2015-07-05: qty 1

## 2015-07-05 MED ORDER — METOPROLOL TARTRATE 50 MG PO TABS
50.0000 mg | ORAL_TABLET | Freq: Two times a day (BID) | ORAL | Status: DC
  Administered 2015-07-05 – 2015-07-07 (×5): 50 mg via ORAL
  Filled 2015-07-05 (×5): qty 1

## 2015-07-05 MED ORDER — ATORVASTATIN CALCIUM 80 MG PO TABS
80.0000 mg | ORAL_TABLET | Freq: Every day | ORAL | Status: DC
  Administered 2015-07-05 – 2015-07-06 (×2): 80 mg via ORAL
  Filled 2015-07-05 (×2): qty 1

## 2015-07-05 MED ORDER — ACETAMINOPHEN 325 MG PO TABS: 650.0000 mg | ORAL_TABLET | ORAL | Status: DC | PRN

## 2015-07-05 MED ORDER — APIXABAN 5 MG PO TABS
5.0000 mg | ORAL_TABLET | Freq: Two times a day (BID) | ORAL | Status: DC
  Administered 2015-07-05 – 2015-07-07 (×4): 5 mg via ORAL
  Filled 2015-07-05 (×4): qty 1

## 2015-07-05 MED ORDER — ONDANSETRON HCL 4 MG/2ML IJ SOLN
4.0000 mg | Freq: Four times a day (QID) | INTRAMUSCULAR | Status: DC | PRN
  Administered 2015-07-06: 4 mg via INTRAVENOUS
  Filled 2015-07-05: qty 2

## 2015-07-05 NOTE — ED Notes (Signed)
Pt presents with sudden onset of palpitations that began this morning.  Pt denies any chest pain, reports shortness of breath and dry cough. Pt reports stopping all his medication x 1 month.

## 2015-07-05 NOTE — ED Provider Notes (Signed)
CSN: [id number]     Arrival date & time 07/05/15  [id number] History   First MD Initiated Contact with Patient 07/05/15 [id number]     Chief Complaint  Patient presents with  . Palpitations     (Consider location/radiation/quality/duration/timing/severity/associated sxs/prior Treatment) HPI   69 year old Caucasian male with past medical history of OSA, HTN, chronic systolic heart failure, PAF on eliquis and a history of CAD s/p CABG x5 2003 presenting with dyspnea. Seen in ED 11/7 with afib/flutter. Had DCCV and felt better. "It was night and day." Began feeling SOB two days later and seen again in the ED 11/9. Was in NSR then and started on abx for possible pneumonia. Continued to feel SOB and was significantly worse this morning so decided to come to ED. Denies any pain.   Pt has stopped taking all of his medication aside from aspirin. "I did fine for 12 years on just aspirin." He had stopped all of them months ago and was not on any of them on 11/7 when he was cardioverted although he reported he was at that time. When asked why he told providers he was taking them he replied, "So they would shock me and we could get on with it."   Past Medical History  Diagnosis Date  . CAD (coronary artery disease)     a. s/p CABG 2003  . Paroxysmal atrial flutter (HCC)     a. s/p succesful TEE/DCCV on 06/01/14.  b. on Eliquis  . Chronic systolic CHF (congestive heart failure) (HCC)     a. 2D ECHO: 05/17/2014: EF 30-35%, mild concentric hypertrophy, diffuse hypokinesis, akinesis of the basalinferior myocardium, severe hypokinesis of the mid-apicalinferior myocardium. Aortic sclerosis, mild MR  . Cholecystitis   . HTN (hypertension)    Past Surgical History  Procedure Laterality Date  . Coronary artery bypass graft  2003    x 5  . Tee without cardioversion N/A 06/01/2014    Procedure: TRANSESOPHAGEAL ECHOCARDIOGRAM (TEE);  Surgeon: Lars MassonKatarina H Nelson, MD;  [facility];  Service: Cardiovascular;   Laterality: N/A;  . Cardioversion N/A 06/01/2014    Procedure: CARDIOVERSION;  Surgeon: Lars MassonKatarina H Nelson, MD;  [facility];  Service: Cardiovascular;  Laterality: N/A;  . Cardiac catheterization N/A 06/19/2014    Procedure: RIGHT/LEFT HEART CATH AND CORONARY/GRAFT ANGIOGRAPHY;  Surgeon: Lennette Biharihomas A Kelly, MD;  [facility];  Service: Cardiovascular;  Laterality: N/A;   Family History  Problem Relation Age of Onset  . Cancer Mother   . Cancer Father    Social History  Substance Use Topics  . Smoking status: Former Smoker -- 1.00 packs/day for 40 years    Types: Cigarettes    Quit date: 02/02/2002  . Smokeless tobacco: Never Used  . Alcohol Use: Yes     Comment: 6 pack of beer a week    Review of Systems  All systems reviewed and negative, other than as noted in HPI.   Allergies  Review of patient's allergies indicates no known allergies.  Home Medications   Prior to Admission medications   Medication Sig Start Date End Date Taking? Authorizing Provider  apixaban (ELIQUIS) 5 MG TABS tablet Take 1 tablet (5 mg total) by mouth 2 (two) times daily. 06/01/15   Lennette Biharihomas A Kelly, MD  atorvastatin (LIPITOR) 80 MG tablet Take 1 tablet (80 mg total) by mouth daily. 05/22/15   Lennette Biharihomas A Kelly, MD  furosemide (LASIX) 40 MG tablet Take 1 tablet (40 mg total) by mouth daily. 05/01/15  Lennette Bihari, MD  isosorbide mononitrate (IMDUR) 30 MG 24 hr tablet Take 1 tablet (30 mg total) by mouth daily. 01/02/15   Lennette Bihari, MD  levofloxacin (LEVAQUIN) 500 MG tablet Take 1 tablet (500 mg total) by mouth daily. 06/13/15   Jerelyn Scott, MD  lisinopril (PRINIVIL,ZESTRIL) 20 MG tablet Take 1.5 tablets (30 mg total) by mouth daily. 05/01/15   Lennette Bihari, MD  metoprolol succinate (TOPROL XL) 50 MG 24 hr tablet Take 1 tablet (50 mg total) by mouth daily. Take  in AM and this  tablet in PM daily 06/11/15   Benjiman Core, MD  metoprolol succinate (TOPROL-XL) 100 MG 24 hr  tablet Take 1 tablet (100 mg total) by mouth daily. Patient not taking: Reported on 06/13/2015 11/16/14   Lennette Bihari, MD  Multiple Vitamin (MULTIVITAMIN WITH MINERALS) TABS tablet Take 1 tablet by mouth daily.    Historical Provider, MD  RANEXA 500 MG 12 hr tablet take 1 tablet by mouth twice a day 06/04/15   Lennette Bihari, MD   BP 176/138 mmHg  Pulse 160  Temp(Src) 98.5 F (36.9 C) (Oral)  Resp 17  Ht  (1.803 m)  Wt 212 lb (96.163 kg)  BMI 29.58 kg/m2  SpO2 100% Physical Exam  Constitutional: He appears well-developed and well-nourished. No distress.  HENT:  Head: Normocephalic and atraumatic.  Eyes: Conjunctivae are normal. Right eye exhibits no discharge. Left eye exhibits no discharge.  Neck: Neck supple.  Cardiovascular: Regular rhythm and normal heart sounds.  Exam reveals no gallop and no friction rub.   No murmur heard. tachcyardic  Pulmonary/Chest: Effort normal and breath sounds normal. No respiratory distress.  Abdominal: Soft. He exhibits no distension. There is no tenderness.  Musculoskeletal: He exhibits no edema or tenderness.  Neurological: He is alert.  Skin: Skin is warm and dry.  Psychiatric: He has a normal mood and affect. His behavior is normal. Thought content normal.  Nursing note and vitals reviewed.   ED Course  Procedures (including critical care time) Labs Review Labs Reviewed  CBC WITH DIFFERENTIAL/PLATELET  BASIC METABOLIC PANEL  TROPONIN I  MAGNESIUM  BRAIN NATRIURETIC PEPTIDE    Imaging Review No results found. I have personally reviewed and evaluated these images and lab results as part of my medical decision-making.   EKG Interpretation   Date/Time:  Thursday July 05 2015 07:16:53 EST Ventricular Rate:  143 PR Interval:    QRS Duration: 96 QT Interval:  294 QTC Calculation: 453 R Axis:   89 Text Interpretation:  Atrial fibrillation with rapid ventricular response  with premature ventricular or aberrantly  conducted complexes Cannot rule  out Anterior infarct , age undetermined Abnormal ECG Confirmed by Jaleisa Brose   MD, Amyla Heffner ([id number]) on 07/05/2015 7:39:01 AM      MDM   Final diagnoses:  Atrial fibrillation with RVR (HCC)   69yM with dyspnea. In afib with RVR. HX of same.  Denies CP. Cardizem for rate control.  Stopped taking all meds except for aspirin. He stopped taking meds because he felt like many symptoms he was having were because of side effects. Some potentially may be but many probably not. Needs better education on his medical problems and medications.   Raeford Razor, MD 07/12/15 [phone]

## 2015-07-05 NOTE — H&P (Signed)
Patient ID: Stanley [name]: [id number], DOB/AGE: 68-Oct-1947   Admit date: 07/05/2015   Primary Physician: Stanley Wells., MD Primary Cardiologist: Dr. Herbie Wells (previously Dr. Tresa Wells, changed to Dr. Herbie Wells at pt's request)  Pt. Profile:  Stanley Wells is a 69 year old Caucasian male with past medical history of OSA, HTN, chronic systolic heart failure, PAF on eliquis and a history of CAD s/p CABG x5 2003 presented with recurrent afib due to stopping all meds except for ASA for 2 month  Problem List  Past Medical History  Diagnosis Date  . CAD (coronary artery disease)     a. s/p CABG 2003  . Paroxysmal atrial flutter (HCC)     a. s/p succesful TEE/DCCV on 06/01/14.  b. on Eliquis  . Chronic systolic CHF (congestive heart failure) (HCC)     a. 2D ECHO: 05/17/2014: EF 30-35%, mild concentric hypertrophy, diffuse hypokinesis, akinesis of the basalinferior myocardium, severe hypokinesis of the mid-apicalinferior myocardium. Aortic sclerosis, mild MR  . Cholecystitis   . HTN (hypertension)     Past Surgical History  Procedure Laterality Date  . Coronary artery bypass graft  2003    x 5  . Tee without cardioversion N/A 06/01/2014    Procedure: TRANSESOPHAGEAL ECHOCARDIOGRAM (TEE);  Surgeon: Stanley Masson, MD;  Location: [facility] ENDOSCOPY;  Service: Cardiovascular;  Laterality: N/A;  . Cardioversion N/A 06/01/2014    Procedure: CARDIOVERSION;  Surgeon: Stanley Masson, MD;  Location: [facility] ENDOSCOPY;  Service: Cardiovascular;  Laterality: N/A;  . Cardiac catheterization N/A 06/19/2014    Procedure: RIGHT/LEFT HEART CATH AND CORONARY/GRAFT ANGIOGRAPHY;  Surgeon: Stanley Bihari, MD;  Location: [facility] CATH LAB;  Service: Cardiovascular;  Laterality: N/A;     Allergies  No Known Allergies  HPI  Stanley Wells is a 69 year old Caucasian male with past medical history of OSA, HTN, chronic systolic heart failure, PAF on eliquis and a history of CAD s/p CABG x5 2003. He first noted  to have atrial fibrillation in October 2015. He was started on eliquis as time. Echocardiogram showed EF 30-35% with diffuse hypokinesis, mild to moderate tricuspid regurg and moderate to severe mitral regurg. He underwent TEE DC cardioversion. Afterward, he had more energy. He had a false positive stress test in 06/13/2014. He underwent cardiac catheterization on 06/19/2014 which showed patent LIMA to LAD, patent SVG to OM 2, occluded SVG to D1, occluded SVG to sequential PDA/PLA. Medical therapy was recommended. He had diagnostic polysomnography at [facility] on 09/06/2014 which revealed moderate obstructive and central sleep apnea with events being worse in the supine position. However patient does not wish to pursue CPAP.  He was last seen by cardiology on 06/11/2015 for atrial fibrillation with RVR. At that time, he reported he was taking Toprol-XL along with eliquis and he was cardioverted in the ED and discharged. However according to the wife, she just found out the patient has not been taking his medication for close to 2 month now. He did not want to take the eliquis because of he was having a lot of bruising on aspirin, and he did not want to take anything stronger. His only medication has been aspirin, all other medication has been stopped. He was seen back 2 days later for shortness of breath. EKG obtained in the ED at the time shows he was in sinus rhythm. Chest x-ray showed area of atelectasis, bronchitis and possible pneumonia. He was given a course of Levaquin. For the past month, patient has been having intermittent  shortness breath and palpitation. He denies any CP, orthopnea and PND. This morning, his shortness of breath worsened after he got up and go to the bathroom. He checked his pulse and was running in the 190s. This prompted the patient to seek medical attention at [facility] again.  On arrival, he was in A. fib with RVR on EKG. BNP was elevated at 873. Chest x-ray is negative.  White blood cell count is 16.7. Interestingly, patient denies any recent signs of infection after being treated with antibiotic month ago. He denies any fever or chill, he does have mild cough. His troponin in the ED was borderline elevated at 0.04. He admits to noncompliance with all his medication this time. Cardiology has been consulted for A. fib with RVR in the setting of medication noncompliance.    Home Medications  Prior to Admission medications   Medication Sig Start Date End Date Taking? Authorizing Provider  aspirin EC 81 MG tablet Take 81 mg by mouth daily.   Yes Historical Provider, MD  Multiple Vitamin (MULTIVITAMIN WITH MINERALS) TABS tablet Take 1 tablet by mouth daily.   Yes Historical Provider, MD  apixaban (ELIQUIS) 5 MG TABS tablet Take 1 tablet (5 mg total) by mouth 2 (two) times daily. 06/01/15   Stanley Bihari, MD  atorvastatin (LIPITOR) 80 MG tablet Take 1 tablet (80 mg total) by mouth daily. 05/22/15   Stanley Bihari, MD  furosemide (LASIX) 40 MG tablet Take 1 tablet (40 mg total) by mouth daily. 05/01/15   Stanley Bihari, MD  isosorbide mononitrate (IMDUR) 30 MG 24 hr tablet Take 1 tablet (30 mg total) by mouth daily. 01/02/15   Stanley Bihari, MD  levofloxacin (LEVAQUIN) 500 MG tablet Take 1 tablet (500 mg total) by mouth daily. 06/13/15   Stanley Scott, MD  lisinopril (PRINIVIL,ZESTRIL) 20 MG tablet Take 1.5 tablets (30 mg total) by mouth daily. 05/01/15   Stanley Bihari, MD  metoprolol succinate (TOPROL XL) 50 MG 24 hr tablet Take 1 tablet (50 mg total) by mouth daily. Take 100mg  in AM and this 50mg  tablet in PM daily 06/11/15   Stanley Core, MD  metoprolol succinate (TOPROL-XL) 100 MG 24 hr tablet Take 1 tablet (100 mg total) by mouth daily. Patient not taking: Reported on 06/13/2015 11/16/14   Stanley Bihari, MD  RANEXA 500 MG 12 hr tablet take 1 tablet by mouth twice a day 06/04/15   Stanley Bihari, MD    Family History  Family History  Problem Relation Age of  Onset  . Cancer Mother   . Cancer Father     Social History  Social History   Social History  . Marital Status: Married    Spouse Name: N/A  . Number of Children: N/A  . Years of Education: N/A   Occupational History  . Retired    Social History Main Topics  . Smoking status: Former Smoker -- 1.00 packs/day for 40 years    Types: Cigarettes    Quit date: 02/02/2002  . Smokeless tobacco: Never Used  . Alcohol Use: Yes     Comment: 6 pack of beer a week  . Drug Use: No  . Sexual Activity: Not on file   Other Topics Concern  . Not on file   Social History Narrative   Lives in [place] with wife.     Review of Systems General:  No chills, fever, night sweats or weight changes.  Cardiovascular:  No chest  pain,edema, orthopnea, palpitations, paroxysmal nocturnal dyspnea. + dyspnea on exertion Dermatological: No rash, lesions/masses Respiratory: + cough, dyspnea Urologic: No hematuria, dysuria Abdominal:   No nausea, vomiting, diarrhea, bright red blood per rectum, melena, or hematemesis Neurologic:  No visual changes, wkns, changes in mental status. All other systems reviewed and are otherwise negative except as noted above.  Physical Exam  Blood pressure 147/113, pulse 106, temperature 98.7 F (37.1 C), temperature source Oral, resp. rate 25, height 5\' 11"  (1.803 m), weight 212 lb (96.163 kg), SpO2 97 %.  General: Pleasant, NAD Psych: Normal affect. Neuro: Alert and oriented X 3. Moves all extremities spontaneously. HEENT: Normal  Neck: Supple without bruits or JVD. Lungs:  Resp regular and unlabored, CTA. Heart: irregular. no s3, s4, or murmurs. Abdomen: Soft, non-tender, non-distended, BS + x 4.  Extremities: No clubbing, cyanosis or edema. DP/PT/Radials 2+ and equal bilaterally.  Labs  Troponin (Point of Care Test) No results for input(s): TROPIPOC in the last 72 hours.  Recent Labs  07/05/15 0730  TROPONINI 0.04*   Lab Results  Component Value Date    WBC 16.7* 07/05/2015   HGB 14.5 07/05/2015   HCT 45.3 07/05/2015   MCV 91.7 07/05/2015   PLT 256 07/05/2015    Recent Labs Lab 07/05/15 0730  NA 137  K 4.4  CL 105  CO2 22  BUN 19  CREATININE 1.06  CALCIUM 9.2  GLUCOSE 136*   Lab Results  Component Value Date   CHOL 213* 05/01/2015   HDL 38* 05/01/2015   LDLCALC 126 05/01/2015   TRIG 245* 05/01/2015   Lab Results  Component Value Date   DDIMER 0.43 06/13/2015     Radiology/Studies  Dg Chest 2 View  06/13/2015  CLINICAL DATA:  Short of breath EXAM: CHEST  2 VIEW COMPARISON:  06/11/2015 FINDINGS: Sternotomy wires overlie normal cardiac silhouette. Increased linear opacities at the lung bases. Small bilateral effusions. No pneumothorax. Hyperinflated lungs. IMPRESSION: Increased linear opacities lung bases suggests atelectasis, pneumonia, or bronchitis. Pneumonia less favored. Small effusions. Electronically Signed   By: Genevive Bi M.D.   On: 06/13/2015 07:03   Dg Chest Portable 1 View  07/05/2015  CLINICAL DATA:  Dyspnea EXAM: PORTABLE CHEST 1 VIEW COMPARISON:  06/13/2015 FINDINGS: Cardiac enlargement with changes of CABG. Negative for heart failure. Lungs are clear without infiltrate or effusion or mass lesion. IMPRESSION: No active disease. Electronically Signed   By: Marlan Palau M.D.   On: 07/05/2015 08:02   Dg Chest Portable 1 View  06/11/2015  CLINICAL DATA:  Tachycardia, shortness of breath, atrial fibrillation EXAM: PORTABLE CHEST 1 VIEW COMPARISON:  05/29/2014 FINDINGS: Prior coronary bypass. Stable cardiomegaly without focal pneumonia, collapse or consolidation. No edema, large effusion or pneumothorax. Degenerative changes of the left AC joint. No significant interval change. IMPRESSION: Stable cardiomegaly.  No current CHF or pneumonia. Electronically Signed   By: Judie Petit.  Shick M.D.   On: 06/11/2015 12:20    ECG  afib with RVR  Echocardiogram 06/01/2014  LV EF: 30% -   35%  ------------------------------------------------------------------- History:  PMH:  Atrial fibrillation.  ------------------------------------------------------------------- Study Conclusions  - Left ventricle: The cavity size was mildly dilated. Systolic function was moderately to severely reduced. The estimated ejection fraction was in the range of 30% to 35%. Diffuse hypokinesis. No evidence of thrombus. - Aortic valve: Moderately thickened, moderately calcified leaflets. There was no stenosis. There was no regurgitation. - Aorta: Mild plaque. - Mitral valve: No evidence of vegetation. There was moderate  to severe regurgitation with systolic reversal of flow in pulmonary veins. - Left atrium: The atrium was dilated. No evidence of thrombus in the atrial cavity or appendage. No evidence of thrombus in the atrial cavity or appendage. Emptying velocity was moderately reduced. - Right ventricle: Systolic function was mildly reduced. - Right atrium: No evidence of thrombus in the atrial cavity or appendage. - Tricuspid valve: There was mild-moderate regurgitation. - Pulmonic valve: No evidence of vegetation. - Pericardium, extracardiac: There was no pericardial effusion.  Impressions:  - Successful cardioversion. No cardiac source of emboli was indentified.     ASSESSMENT AND PLAN Principal Problem:   Atrial fibrillation with rapid ventricular response (HCC) Active Problems:   Chronic combined systolic and diastolic heart failure, NYHA class 2 (HCC)   Coronary artery disease involving native coronary artery without angina pectoris   Cardiomyopathy, ischemic: EF 30-35%   Noncompliance with medication regimen   Paroxysmal atrial flutter (HCC)   Essential hypertension   Paroxysmal atrial fibrillation with RVR (HCC)   OSA (obstructive sleep apnea)   1. Paroxysmal atrial fibrillation 2/2 medication noncompliance  - h/o afib/aflutter s/p DCCV  06/01/2014, again DCCV on 06/11/2015 (he told provider he was on eliquis even though he was not)  - stopped all of his med close to 2 month ago, only on ASA at home. When asked about noncompliance and why he stopped on meds, he says they weren't doing him much good. Very pleasant patient, but noncompliance is a problem. Emphasized repeatedly today that he need to be compliant  - CHA2DS2-Vasc score 4 (age, CAD, HF, HTN)  - admit to cardiology, resume eliquis, continue IV diltiazem for now, continue lower dose PO metoprolol. Continue lower dose of ACEI for LV dysfunction . Continue Imdur and renaxa.   - elevated BNP, appears to be euvolemic on exam, BNP elevation likely related to afib  2. history of CAD s/p CABG x5 2003 by Dr. Cornelius Moraswen - cath 06/19/2014 patent LIMA to LAD, patent SVG to OM 2, occluded SVG to D1, occluded SVG sequential to PDA/PLA. Medical therapy was recommended.  3. Chronic systolic heart failure with EF 30-35% on echo 10/25: euvolemic on exam  4. OSA not on CPAP  5. Moderately severe MR on echo 05/2014  6. HTN  Signed, Azalee CourseMeng, Hao, [place]PA-C 07/05/2015, 11:34 AM    I have seen, examined and evaluated the patient this PM along with Mr. Eyvonne LeftMeng, PA-C (in ER).  After reviewing all the available data and chart,  I agree with his findings, examination as well as impression recommendations.  Patient is relatively well-known to me from your visit with A. fib RVR. At that time, we were led to believe that he was compliant with his medications including beta blocker and ELIQUIS. We opted for elective cardioversion while in the emergency room and then discharged with planned follow-up. Unfortunately he did not go to follow-up appointment, as it turns out he was not compliant with medications, and has not been taking his medications for over 2 months now.  He now returns with recurrent A. fib RVR and some exertional dyspnea. Mild spurring elevation which is not unheard of in the setting  of A. fib RVR with his cardiac myopathy.  Unfortunately this point, I don't think we can safely cardiovert him because he is indicated he has not been on his ELIQUIS. It turns out that he simply had what he considered to be a bad relationship with his cardiologist and feeling that that he was not  being listened to [place] far as him feeling poorly with medicines like his statin medicine. He is somewhat he would not even recommend medicine unless he is feeling poorly. He did not understand the importance of the medications that he was on. He was tired of having bleeding with ELIQUIS and didn't understand why he couldn't get back on aspirin.  Spent about 45 minutes with the patient, his son and wife explaining to him the importance of each the medications he is on and the concerns that I have about why he is not taking his medications. I then understood the simply wanted to potentially switch cardiologists. I have agreed to take over his his cardiologist if it would mean getting him back on his medications.  At this point I think he needs to be restarted on his beta blocker, we will use IV diltiazem while the beta blocker is kicking in and noted to provide more rate control. We will make she is back on ELIQUIS. The next step to see how he does on the beta blocker as opposed to not being on the beta blocker as far as any recurrence of A. fib. He has recurrence, at that time would probably need to consider antiarrhythmic agent. Probably start with dronaderone as the first option, but would then potentially needing discussed with electrophysiology as far as the preference of sotalol, Tikosyn or amiodarone.  He has ischemic cardiomyopathy, we will restart his ARB and his Toprol he has occasional edema but nothing consistent, is reluctant to take standing dose of Lasix, but I think we can agree that he needs to be on at least a when necessary Lasix dose. We talked about sliding scale dosing with weight change in  symptoms. He is agreeable to that.  He has significant cramping with statins, especially atorvastatin high-dose. For now we'll see how he does with that in the hospital, but would be inclined to switch to either Crestor or pravastatin to see how he tolerates those, if he still has severe cramps on those medications, would consider referral for [id number]-I injections.  We will continue his Imdur for now, as he does have some exertional tightness when he is in A. fib. He has significant CAD but by cath a year ago was stable.  The patient will likely require at least overnight observation for rate control. I would like to see how he does with simple rate control as well. If he is tolerating rate controlled atrial fibrillation with no significant heart failure symptoms, but could potentially consider discharge with simple rate control as opposed to rhythm control.  For his discharge, he has asked to switch cardiologists. He also has not seen his primary physician in quite some time and was suggested to him that he switch to [facility].  We can refer him to [facility], but will arrange OP f/u with me @ [facility] office on discharge.    Marykay Lex, M.D., M.S. Interventional Cardiologist   Pager # [phone]

## 2015-07-05 NOTE — ED Notes (Signed)
Family at bedside. 

## 2015-07-06 DIAGNOSIS — N179 Acute kidney failure, unspecified: Secondary | ICD-10-CM | POA: Diagnosis not present

## 2015-07-06 DIAGNOSIS — I4891 Unspecified atrial fibrillation: Secondary | ICD-10-CM | POA: Diagnosis not present

## 2015-07-06 DIAGNOSIS — I255 Ischemic cardiomyopathy: Secondary | ICD-10-CM | POA: Diagnosis not present

## 2015-07-06 DIAGNOSIS — I110 Hypertensive heart disease with heart failure: Secondary | ICD-10-CM | POA: Diagnosis not present

## 2015-07-06 DIAGNOSIS — I5042 Chronic combined systolic (congestive) and diastolic (congestive) heart failure: Secondary | ICD-10-CM | POA: Diagnosis not present

## 2015-07-06 DIAGNOSIS — I480 Paroxysmal atrial fibrillation: Secondary | ICD-10-CM | POA: Diagnosis not present

## 2015-07-06 DIAGNOSIS — N170 Acute kidney failure with tubular necrosis: Secondary | ICD-10-CM

## 2015-07-06 DIAGNOSIS — I2510 Atherosclerotic heart disease of native coronary artery without angina pectoris: Secondary | ICD-10-CM | POA: Diagnosis not present

## 2015-07-06 LAB — BASIC METABOLIC PANEL
Anion gap: 10 (ref 5–15)
BUN: 32 mg/dL — AB (ref 6–20)
CO2: 21 mmol/L — ABNORMAL LOW (ref 22–32)
CREATININE: 1.9 mg/dL — AB (ref 0.61–1.24)
Calcium: 8.9 mg/dL (ref 8.9–10.3)
Chloride: 103 mmol/L (ref 101–111)
GFR, EST AFRICAN AMERICAN: 40 mL/min — AB (ref >60)
GFR, EST NON AFRICAN AMERICAN: 35 mL/min — AB (ref >60)
Glucose, Bld: 157 mg/dL — ABNORMAL HIGH (ref 65–99)
POTASSIUM: 5 mmol/L (ref 3.5–5.1)
SODIUM: 134 mmol/L — AB (ref 135–145)

## 2015-07-06 LAB — TROPONIN I: TROPONIN I: 0.03 ng/mL (ref <0.031)

## 2015-07-06 MED ORDER — ROSUVASTATIN CALCIUM 40 MG PO TABS
40.0000 mg | ORAL_TABLET | Freq: Every day | ORAL | Status: DC
  Administered 2015-07-06: 40 mg via ORAL
  Filled 2015-07-06: qty 1

## 2015-07-06 MED ORDER — PROMETHAZINE HCL 25 MG/ML IJ SOLN: 12.5000 mg | Freq: Once | INTRAMUSCULAR | Status: DC

## 2015-07-06 MED ORDER — ROSUVASTATIN CALCIUM 20 MG PO TABS: 20.0000 mg | ORAL_TABLET | Freq: Every day | ORAL | Status: DC

## 2015-07-06 NOTE — Progress Notes (Signed)
EKG performed as ordered and results are in Epic.

## 2015-07-06 NOTE — Progress Notes (Signed)
As ordered, pt has ambulated in hall a second time, apprx 41200ft without complication. Oxygen was not warranted as his O2 sats = 96% while ambulating.

## 2015-07-06 NOTE — Progress Notes (Signed)
D/c'd cardizem drip per MD order parameter. HR 50s-60s, Bp 90/61 sats 100%RA. Pt c/o nausea, given Zofran per PRN order. Onalee Hualvarez on call, made aware and ordered to monitor pt HR.

## 2015-07-06 NOTE — Progress Notes (Signed)
Patient Name: Stanley Wells Date of Encounter: 07/06/2015  Primary Cardiologist: Dr. Herbie BaltimoreHarding (previously Dr. Tresa EndoKelly, changed to Dr. Herbie BaltimoreHarding at pt's request)   Principal Problem:   Atrial fibrillation with rapid ventricular response [facility](HCC) Active Problems:   Paroxysmal atrial flutter (HCC)   Chronic combined systolic and diastolic heart failure, NYHA class 2 (HCC)   Coronary artery disease involving native coronary artery without angina pectoris   Cardiomyopathy, ischemic: EF 30-35%   OSA (obstructive sleep apnea)   Essential hypertension   Paroxysmal atrial fibrillation with RVR (HCC)   Noncompliance with medication regimen    SUBJECTIVE  Denies any CP or SOB. Some nausea yesterday.   CURRENT MEDS . apixaban  5 mg Oral BID  . atorvastatin  80 mg Oral Daily  . isosorbide mononitrate  30 mg Oral Daily  . metoprolol tartrate  50 mg Oral BID  . multivitamin with minerals  1 tablet Oral Daily  . promethazine  12.5 mg Intravenous Once  . ranolazine  500 mg Oral BID    OBJECTIVE  Filed Vitals:   07/05/15 2019 07/05/15 2349 07/06/15 0109 07/06/15 0629  BP: 106/62 90/61 89/60  124/80  Pulse: 68 58 55 73  Temp: 98.4 F (36.9 C)   98.2 F (36.8 C)  TempSrc: Oral   Oral  Resp: 24   18  Height:      Weight:    212 lb 9.6 oz (96.435 kg)  SpO2: 98% 100% 95% 98%    Intake/Output Summary (Last 24 hours) at 07/06/15 1042 Last data filed at 07/06/15 0636  Gross per 24 hour  Intake 1227.17 ml  Output    175 ml  Net 1052.17 ml   Filed Weights   07/05/15 0719 07/05/15 1341 07/06/15 0629  Weight: 212 lb (96.163 kg) 211 lb 12.8 oz (96.072 kg) 212 lb 9.6 oz (96.435 kg)    PHYSICAL EXAM  General: Pleasant, NAD. Neuro: Alert and oriented X 3. Moves all extremities spontaneously. Psych: Normal affect. HEENT:  Normal  Neck: Supple without bruits or JVD. Lungs:  Resp regular and unlabored, CTA. Heart: irregular. no s3, s4, or murmurs. Abdomen: Soft, non-tender,  non-distended, BS + x 4.  Extremities: No clubbing, cyanosis or edema. DP/PT/Radials 2+ and equal bilaterally.  Accessory Clinical Findings  CBC  Recent Labs  07/05/15 0730  WBC 16.7*  NEUTROABS 13.2*  HGB 14.5  HCT 45.3  MCV 91.7  PLT 256   Basic Metabolic Panel  Recent Labs  07/05/15 0730 07/06/15 0307  NA 137 134*  K 4.4 5.0  CL 105 103  CO2 22 21*  GLUCOSE 136* 157*  BUN 19 32*  CREATININE 1.06 1.90*  CALCIUM 9.2 8.9  MG 2.1  --    Cardiac Enzymes  Recent Labs  07/05/15 1430 07/05/15 1957 07/06/15 0307  TROPONINI 0.04* 0.03 0.03    TELE afib with HR 60-90s    ECG  afib with TWI V4-V6  Echocardiogram 06/01/2014  LV EF: 30% -  35%  ------------------------------------------------------------------- History:  PMH:  Atrial fibrillation.  ------------------------------------------------------------------- Study Conclusions  - Left ventricle: The cavity size was mildly dilated. Systolic function was moderately to severely reduced. The estimated ejection fraction was in the range of 30% to 35%. Diffuse hypokinesis. No evidence of thrombus. - Aortic valve: Moderately thickened, moderately calcified leaflets. There was no stenosis. There was no regurgitation. - Aorta: Mild plaque. - Mitral valve: No evidence of vegetation. There was moderate to severe regurgitation with systolic reversal of flow in  pulmonary veins. - Left atrium: The atrium was dilated. No evidence of thrombus in the atrial cavity or appendage. No evidence of thrombus in the atrial cavity or appendage. Emptying velocity was moderately reduced. - Right ventricle: Systolic function was mildly reduced. - Right atrium: No evidence of thrombus in the atrial cavity or appendage. - Tricuspid valve: There was mild-moderate regurgitation. - Pulmonic valve: No evidence of vegetation. - Pericardium, extracardiac: There was no pericardial  effusion.  Impressions:  - Successful cardioversion. No cardiac source of emboli was indentified.    Radiology/Studies  Dg Chest 2 View  06/13/2015  CLINICAL DATA:  Short of breath EXAM: CHEST  2 VIEW COMPARISON:  06/11/2015 FINDINGS: Sternotomy wires overlie normal cardiac silhouette. Increased linear opacities at the lung bases. Small bilateral effusions. No pneumothorax. Hyperinflated lungs. IMPRESSION: Increased linear opacities lung bases suggests atelectasis, pneumonia, or bronchitis. Pneumonia less favored. Small effusions. Electronically Signed   By: Genevive Bi M.D.   On: 06/13/2015 07:03   Dg Chest Portable 1 View  07/05/2015  CLINICAL DATA:  Dyspnea EXAM: PORTABLE CHEST 1 VIEW COMPARISON:  06/13/2015 FINDINGS: Cardiac enlargement with changes of CABG. Negative for heart failure. Lungs are clear without infiltrate or effusion or mass lesion. IMPRESSION: No active disease. Electronically Signed   By: Marlan Palau M.D.   On: 07/05/2015 08:02   Dg Chest Portable 1 View  06/11/2015  CLINICAL DATA:  Tachycardia, shortness of breath, atrial fibrillation EXAM: PORTABLE CHEST 1 VIEW COMPARISON:  05/29/2014 FINDINGS: Prior coronary bypass. Stable cardiomegaly without focal pneumonia, collapse or consolidation. No edema, large effusion or pneumothorax. Degenerative changes of the left AC joint. No significant interval change. IMPRESSION: Stable cardiomegaly.  No current CHF or pneumonia. Electronically Signed   By: Judie Petit.  Shick M.D.   On: 06/11/2015 12:20    ASSESSMENT AND PLAN  1. Paroxysmal atrial fibrillation 2/2 medication noncompliance - h/o afib/aflutter s/p DCCV 06/01/2014, again DCCV on 06/11/2015 (he told provider he was on eliquis even though he was not) - stopped all of his med close to 2 month ago, only on ASA at home. When asked about noncompliance and why he stopped on meds, he says they weren't doing him much good. Very pleasant patient, but  noncompliance is a problem. Emphasized repeatedly today that he need to be compliant - CHA2DS2-Vasc score 4 (age, CAD, HF, HTN) - Continue Imdur and renaxa. HR well controlled on metoprolol  BID. IV diltizem stoped yesterday due to hypotension. Patient to ambulate today.  - elevated BNP, appears to be euvolemic on exam, BNP elevation likely related to afib  2. AKI: Cr increased from 1.06 up to 1.9 today. No nephrotoxic medication on board. Lisinopril stopped. Likely related to temporary drop in perfusion pressure with hypotension yesterday on IV diltiazem. IV diltiazem stopped.   - if Cr stable tomorrow AM, expect discharge.   3. history of CAD s/p CABG x5 2003 by Dr. [name] - cath 06/19/2014 patent LIMA to LAD, patent SVG to OM 2, occluded SVG to D1, occluded SVG sequential to PDA/PLA. Medical therapy was recommended.  4. Chronic systolic heart failure with EF 30-35% on echo 10/25: euvolemic on exam  5. OSA not on CPAP  6. Moderately severe MR on echo 05/2014  7. HTN 8. HLD: tolerating statin. If has muscle ache, will consider switch to crestor  Signed, Amedeo Plenty Pager: [phone]   I have seen, examined and evaluated the patient this AM along with Mr. Lisabeth Devoid, [place].  After reviewing all the  available data and chart,  I agree with his findings, examination as well as impression recommendations.  Overall, symptomatically improved. He did get a little bit hypotensive when his heart rate dropped, remains in atrial fibrillation but better rate control. Diltiazem stopped overnight, but continued on beta blocker. Due to the low pressures this morning, we will hold his ACE inhibitor since the creatinine level increased (likely increased secondary to prerenal condition with hypotension) severe ATN.  No active heart failure symptoms, sore okay with holding the ACE inhibitor for now. Would only restart once renal function improved.  Patient had  significant arthralgias and myalgias with Lipitor, we'll switch to Crestor.  Again had a long talk with the patient about medication adherence he does agree that he will continue. At this point if he is adherent with medical regimen, the plan will be to continue rate control and discharge if stable probably tomorrow (pending improvement in renal function). Would then be seen by endovascular provider in a couple weeks in order to determine and potentially schedule outpatient cardioversion if he remains in A. fib after 4 weeks of full and regulation.  Again the patient has requested to change cardiologists from his existing cardiologist Dr. Tresa Endo) to myself.    Marykay Lex, M.D., M.S. Interventional Cardiologist   Pager # [phone]

## 2015-07-06 NOTE — Progress Notes (Signed)
As ordered, pt has ambulated in hall, apprx 44600ft without complication.  Oxygen was not warranted as his O2 sats = 96% while ambulating.

## 2015-07-07 ENCOUNTER — Other Ambulatory Visit: Payer: Self-pay | Admitting: Physician Assistant

## 2015-07-07 DIAGNOSIS — I480 Paroxysmal atrial fibrillation: Secondary | ICD-10-CM | POA: Diagnosis not present

## 2015-07-07 DIAGNOSIS — I2510 Atherosclerotic heart disease of native coronary artery without angina pectoris: Secondary | ICD-10-CM | POA: Diagnosis not present

## 2015-07-07 DIAGNOSIS — I4891 Unspecified atrial fibrillation: Secondary | ICD-10-CM | POA: Diagnosis not present

## 2015-07-07 DIAGNOSIS — D72829 Elevated white blood cell count, unspecified: Secondary | ICD-10-CM

## 2015-07-07 DIAGNOSIS — Z9114 Patient's other noncompliance with medication regimen: Secondary | ICD-10-CM | POA: Diagnosis not present

## 2015-07-07 DIAGNOSIS — I4892 Unspecified atrial flutter: Secondary | ICD-10-CM

## 2015-07-07 DIAGNOSIS — R7989 Other specified abnormal findings of blood chemistry: Secondary | ICD-10-CM

## 2015-07-07 DIAGNOSIS — N179 Acute kidney failure, unspecified: Secondary | ICD-10-CM

## 2015-07-07 DIAGNOSIS — R778 Other specified abnormalities of plasma proteins: Secondary | ICD-10-CM

## 2015-07-07 DIAGNOSIS — I959 Hypotension, unspecified: Secondary | ICD-10-CM

## 2015-07-07 DIAGNOSIS — I340 Nonrheumatic mitral (valve) insufficiency: Secondary | ICD-10-CM

## 2015-07-07 LAB — BASIC METABOLIC PANEL
Anion gap: 6 (ref 5–15)
BUN: 31 mg/dL — AB (ref 6–20)
CO2: 27 mmol/L (ref 22–32)
CREATININE: 1.4 mg/dL — AB (ref 0.61–1.24)
Calcium: 8.7 mg/dL — ABNORMAL LOW (ref 8.9–10.3)
Chloride: 105 mmol/L (ref 101–111)
GFR, EST AFRICAN AMERICAN: 58 mL/min — AB (ref >60)
GFR, EST NON AFRICAN AMERICAN: 50 mL/min — AB (ref >60)
Glucose, Bld: 124 mg/dL — ABNORMAL HIGH (ref 65–99)
POTASSIUM: 4.2 mmol/L (ref 3.5–5.1)
SODIUM: 138 mmol/L (ref 135–145)

## 2015-07-07 MED ORDER — METOPROLOL TARTRATE 50 MG PO TABS: 50.0000 mg | ORAL_TABLET | Freq: Two times a day (BID) | ORAL | Status: DC

## 2015-07-07 MED ORDER — FUROSEMIDE 40 MG PO TABS: 40.0000 mg | ORAL_TABLET | Freq: Every day | ORAL | Status: DC | PRN

## 2015-07-07 MED ORDER — ROSUVASTATIN CALCIUM 40 MG PO TABS: 40.0000 mg | ORAL_TABLET | Freq: Every evening | ORAL | Status: DC

## 2015-07-07 NOTE — Progress Notes (Signed)
Subjective:  Feeling great. No CP.   Objective:  Vital Signs in the last 24 hours: Temp:  [97.4 F (36.3 C)-98.3 F (36.8 C)] 98.3 F (36.8 C) (12/03 1206) Pulse Rate:  [79-96] 89 (12/03 1206) Resp:  [16-18] 16 (12/03 1206) BP: (110-138)/(81-99) 116/81 mmHg (12/03 1206) SpO2:  [96 %-98 %] 96 % (12/03 1206) Weight:  [212 lb 11.2 oz (96.48 kg)] 212 lb 11.2 oz (96.48 kg) (12/03 0551)  Intake/Output from previous day: 12/02 0701 - 12/03 0700 In: 1135 [P.O.:1135] Out: 1225 [Urine:1225]   Physical Exam: General: Well developed, well nourished, in no acute distress. Head:  Normocephalic and atraumatic. Lungs: Clear to auscultation and percussion. Heart: Irreg irreg.  No murmur, rubs or gallops.  Abdomen: soft, non-tender, positive bowel sounds. Extremities: No clubbing or cyanosis. No edema. Neurologic: Alert and oriented x 3.    Lab Results:  Recent Labs  07/05/15 0730  WBC 16.7*  HGB 14.5  PLT 256    Recent Labs  07/06/15 0307 07/07/15 0246  NA 134* 138  K 5.0 4.2  CL 103 105  CO2 21* 27  GLUCOSE 157* 124*  BUN 32* 31*  CREATININE 1.90* 1.40*    Recent Labs  07/05/15 1957 07/06/15 0307  TROPONINI 0.03 0.03   Telemetry:  Atrial fibrillation under good rate control Personally viewed.   Cardiac Studies:  EF 30-35%  Scheduled Meds: . apixaban  5 mg Oral BID  . isosorbide mononitrate  30 mg Oral Daily  . metoprolol tartrate  50 mg Oral BID  . multivitamin with minerals  1 tablet Oral Daily  . promethazine  12.5 mg Intravenous Once  . ranolazine  500 mg Oral BID  . rosuvastatin  40 mg Oral q1800   Continuous Infusions:  PRN Meds:.acetaminophen, ondansetron (ZOFRAN) IV   Assessment/Plan:  Principal Problem:   Atrial fibrillation with rapid ventricular response (HCC) Active Problems:   Paroxysmal atrial flutter (HCC)   Chronic combined systolic and diastolic heart failure, NYHA class 2 (HCC)   Coronary artery disease involving native  coronary artery without angina pectoris   Cardiomyopathy, ischemic: EF 30-35%   OSA (obstructive sleep apnea)   Essential hypertension   Paroxysmal atrial fibrillation with RVR (HCC)   Noncompliance with medication regimen  69 year old with chronic systolic heart failure, atrial fibrillation, acute kidney injury resolved ready for discharge.  Atrial fibrillation -Under good rate control currently. -Metoprolol 50 mg twice a day -We could consider cardioversion as outpatient after anticoagulated for 3 weeks.  Chronic anticoagulation -Resuming Eliquis. Noncompliance has been an issue in the past.  Hyperlipidemia -Crestor 40  Ischemic cardiomyopathy, chronic systolic heart failure -EF 30%, appears well compensated. Creatinine has improved from 1.9 down to 1.4. Was normal creatinine 1.06 on admission. -Patient has not been taking furosemide 40 mg once a day. Given his recent creatinine change, it would not be unreasonable for him to take this on an as-needed basis if daily weights increase greater than 3 pounds. -His lisinopril 10 mg daily was held. -Check basic metabolic profile in about 10 days and if creatinine remains normal, I would reinitiate ACE inhibitor at low-dose and titrate upwards. -He has not been taking Ranexa, or isosorbide. It would be fine for him to continue this as an outpatient.  CAD s/p CABG x5 2003 by Dr. [name] - cath 06/19/2014 patent LIMA to LAD, patent SVG to OM 2, occluded SVG to D1, occluded SVG sequential to PDA/PLA. Medical therapy was recommended.  Hyperlipidemia-prior intolerance to atorvastatin,  we switched to Crestor.  Moderate to severe MR on echo 2015, continue to monitor.  Acute kidney injury secondary to hypotension. Improving.Anne Fu.  Stanley Wells 07/07/2015, 12:26 PM

## 2015-07-07 NOTE — Discharge Summary (Signed)
Discharge Summary   Patient ID: Stanley Wells,  MRN: [id number], DOB/AGE: Jun 01, 1946 69 y.o.  Admit date: 07/05/2015 Discharge date: 07/07/2015  Primary Care Provider: [name] Primary Cardiologist: Dr. Herbie Baltimore (patient requested to switch to Dr. Herbie Baltimore)  Discharge Diagnoses    Principal Problem:   Paroxysmal atrial fibrillation with RVR (HCC) Active Problems:   Noncompliance with medication regimen   Acute kidney injury (HCC)   Chronic combined systolic and diastolic heart failure, NYHA class 2 (HCC)   Coronary artery disease involving native coronary artery without angina pectoris   Cardiomyopathy, ischemic: EF 30-35%   OSA (obstructive sleep apnea)   Essential hypertension   Mitral regurgitation   Elevated troponin   Hypotension   Leukocytosis   Allergies No Known Allergies  Diagnostic Studies/Procedures    N/A _____________   History of Present Illness  [name] is a 69 year old Caucasian male with past medical history of OSA, HTN, chronic systolic heart failure, mod-severe MR, PAF, CAD s/p CABG x5 2003 who presented to [facility] 07/05/2015 with recurrent atrial fibrillation in the setting of stopping all medicines except for aspirin for 2 months.  Hospital Course   Per history, he first noted to have atrial fibrillation in October 2015. He was started on Eliquis as time. Echocardiogram showed EF 30-35% with diffuse hypokinesis, mild to moderate tricuspid regurg and moderate to severe mitral regurg. He underwent TEE/DC cardioversion. Afterward, he had more energy. He had a false positive stress test in 06/13/2014. He underwent cardiac catheterization on 06/19/2014 which showed patent LIMA to LAD, patent SVG to OM 2, occluded SVG to D1, occluded SVG to sequential PDA/PLA. Medical therapy was recommended. He had diagnostic polysomnography at [facility] on 09/06/2014 which revealed moderate obstructive and central sleep apnea with events being  worse in the supine position. However patient did not wish to pursue CPAP.  He was last seen by cardiology on 06/11/2015 for atrial fibrillation with RVR. At that time, he reported he was taking Toprol-XL along with Eliquis and he was cardioverted in the ED and discharged. However, according to the wife, she just found out the patient had not been taking his medication for close to 2 months. He admitted he did not want to take the Eliquis because of he was having a lot of bruising on aspirin, and he did not want to take anything stronger. He was seen back 2 days later for shortness of breath. EKG obtained in the ED showed NSR. Chest x-ray showed area of atelectasis, bronchitis and possible pneumonia. He was given a course of Levaquin. Unfortunately for the past month, he has been having intermittent shortness breath and palpitations. No CP, orthopnea and PND. On the morning of admission his SOB worsened and he checked his pulse which was running in the 190s, prompting him to return to the ER. On arrival, he was in Afib with RVR on EKG. BNP was elevated at 873. Chest x-ray was negative. WBC 16.7. He denied any fever or chills but did report mild cough. His troponin in the ED was borderline elevated at 0.04, subsequent sets negative. He was admitted for further management.   He was placed on IV diltiazem and restarted on metoprolol. Eliquis was restarted. He appeared to be euvolemic on exam and BNP/elevated troponin were felt likely due to atrial fibrillation. Diltiazem had to be stopped due to hypotension. He then developed AKI with Cr from 1.06 to 1.9 and thus lisinopril was held - felt due to ATN  from hypotension. He did not require diuresis this admission so there was no Lasix on board when this occurred. He has h/o myalgias on Lipitor so was changed to Crestor. Dr. Herbie Baltimore had a long talk with the patient about med adherence and he agreed that he would continue. The plan for now is for continued rate control  with f/u in the office in a couple of weeks to discuss possible outpatient cardioversion if he remains in A. fib after 4 weeks of uninterrupted compliance with anticoagulation. Cr has improved today to 1.40. HR is in the 80s and BP is normotensive. Dr. Anne Fu has seen and examined the patient today and feels he is stable for discharge. Since the patient was not taking Ranexa at home and not having any chest pain, this was not resumed at discharge per d/w Dr. Anne Fu.  Dr. Anne Fu recommends checking BMET in 10 days and if Cr is normal, would re-initiate ACEI at low dose and titrate upwards. Lasix was recommended only to take on an as-needed basis if daily weights increase greater than 3 pounds. We will also recheck a CBC at the time of his BMET given leukocytosis this admission to ensure it's returning to normal. I have sent a message to our [facility] office's scheduler requesting a follow-up appointment to coincide with those labs, and our office will call the patient with this information. If the patient is tolerating statin at time of follow-up appointment, would consider rechecking liver function/lipid panel in 6-8 weeks.  _____________  Discharge Vitals Blood pressure 116/81, pulse 89, temperature 98.3 F (36.8 C), temperature source Oral, resp. rate 16, height  (1.803 m), weight 212 lb 11.2 oz (96.48 kg), SpO2 96 %.  Filed Weights   07/05/15 1341 07/06/15 0629 07/07/15 0551  Weight: 211 lb 12.8 oz (96.072 kg) 212 lb 9.6 oz (96.435 kg) 212 lb 11.2 oz (96.48 kg)   _____________  Labs     CBC  Recent Labs  07/05/15 0730  WBC 16.7*  NEUTROABS 13.2*  HGB 14.5  HCT 45.3  MCV 91.7  PLT 256   Basic Metabolic Panel  Recent Labs  07/05/15 0730 07/06/15 0307 07/07/15 0246  NA 137 134* 138  K 4.4 5.0 4.2  CL 105 103 105  CO2 22 21* 27  GLUCOSE 136* 157* 124*  BUN 19 32* 31*  CREATININE 1.06 1.90* 1.40*  CALCIUM 9.2 8.9 8.7*  MG 2.1  --   --    ardiac Enzymes  Recent  Labs  07/05/15 1430 07/05/15 1957 07/06/15 0307  TROPONINI 0.04* 0.03 0.03   _____________  Disposition   Pt is being discharged home today in good condition. _____________  Follow-up Plans & Appointments    Follow-up Information    Follow up with [facility], Piedad Climes, MD.   Specialty:  Cardiology   Why:  Office will call you for your followup appointment. Call office if you have not heard back in 3 days. Make sure to clarify which office location you will be visiting since it may be at [facility] or [employer].   Contact information:   [address] [place] [address] [phone]      Discharge Instructions    Diet - low sodium heart healthy    Complete by:  As directed      Increase activity slowly    Complete by:  As directed   Please note the following medicine changes: - Your furosemide/Lasix has been changed to AS NEEDED (  see med list) - Your metoprolol was changed to a different form/prescription - metoprolol TARTRATE - Your atorvastatin was stopped and you were changed to rosuvastatin - The following medicines were stopped: Toprol (long-acting metoprolol), lisinopril, atorvastatin, Ranexa, and aspirin - Your Eliquis, isosorbide, and multivitamin remain the same.           Discharge Medications   Current Discharge Medication List    START taking these medications   Details  metoprolol (LOPRESSOR) 50 MG tablet Take 1 tablet (50 mg total) by mouth 2 (two) times daily. Qty: 60 tablet, Refills: 3    rosuvastatin (CRESTOR) 40 MG tablet Take 1 tablet (40 mg total) by mouth every evening. Qty: 30 tablet, Refills: 3      CONTINUE these medications which have CHANGED   Details  furosemide (LASIX) 40 MG tablet Take 1 tablet (40 mg total) by mouth daily as needed (fluid weight gain of 3 pounds or greater). Qty: 30 tablet, Refills: 1      CONTINUE these medications which have NOT CHANGED   Details  Multiple Vitamin (MULTIVITAMIN WITH  MINERALS) TABS tablet Take 1 tablet by mouth daily.    apixaban (ELIQUIS) 5 MG TABS tablet Take 1 tablet (5 mg total) by mouth 2 (two) times daily. Q    isosorbide mononitrate (IMDUR) 30 MG 24 hr tablet Take 1 tablet (30 mg total) by mouth daily.       STOP taking these medications     aspirin EC 81 MG tablet      atorvastatin (LIPITOR) 80 MG tablet      levofloxacin (LEVAQUIN) 500 MG tablet - finished PTA      lisinopril (PRINIVIL,ZESTRIL) 20 MG tablet      metoprolol succinate (TOPROL XL) 50 MG 24 hr tablet      metoprolol succinate (TOPROL-XL) 100 MG 24 hr tablet      RANEXA 500 MG 12 hr tablet           Outstanding Labs/Studies   CBC/BMET as above  Duration of Discharge Encounter   Greater than 30 minutes including physician time.  Signed, Dayna Dunn PA-C 07/07/2015, 40:24 PM  69 year old with chronic systolic heart failure, atrial fibrillation, acute kidney injury resolved ready for discharge.  Atrial fibrillation -Under good rate control currently. -Metoprolol 50 mg twice a day -We could consider cardioversion as outpatient after anticoagulated for 3 weeks.  Chronic anticoagulation -Resuming Eliquis. Noncompliance has been an issue in the past.  Hyperlipidemia -Crestor 40  Ischemic cardiomyopathy, chronic systolic heart failure -EF 30%, appears well compensated. Creatinine has improved from 1.9 down to 1.4. Was normal creatinine 1.06 on admission. -Patient has not been taking furosemide 40 mg once a day. Given his recent creatinine change, it would not be unreasonable for him to take this on an as-needed basis if daily weights increase greater than 3 pounds. -His lisinopril 10 mg daily was held. -Check basic metabolic profile in about 10 days and if creatinine remains normal, I would reinitiate ACE inhibitor at low-dose and titrate upwards. -He has not been taking Ranexa, or isosorbide. It would be fine for him to continue Imdur as an outpatient (Renaxa  possible cost deterrent -compliance).  CAD s/p CABG x5 2003 by Dr. [name] - cath 06/19/2014 patent LIMA to LAD, patent SVG to OM 2, occluded SVG to D1, occluded SVG sequential to PDA/PLA. Medical therapy was recommended.  Hyperlipidemia-prior intolerance to atorvastatin, we switched to Crestor.  Moderate to severe MR on echo 2015, continue to monitor.  Acute kidney injury secondary to hypotension. Improving.Anne Fu.  Meriah Shands

## 2015-07-07 NOTE — Progress Notes (Signed)
Pt. discharged home instructions given wife at the bedside, questions answered refused wheelchair out. IV and tele removed.

## 2015-07-13 ENCOUNTER — Emergency Department (HOSPITAL_COMMUNITY): Payer: Medicare Other

## 2015-07-13 ENCOUNTER — Inpatient Hospital Stay (HOSPITAL_COMMUNITY)
Admission: EM | Admit: 2015-07-13 | Discharge: 2015-07-15 | DRG: 293 | Disposition: A | Discharge status: Home / Self Care | Payer: Medicare Other | Attending: Cardiology | Admitting: Cardiology

## 2015-07-13 ENCOUNTER — Encounter (HOSPITAL_COMMUNITY): Payer: Self-pay | Admitting: *Deleted

## 2015-07-13 DIAGNOSIS — R0602 Shortness of breath: Secondary | ICD-10-CM | POA: Diagnosis not present

## 2015-07-13 DIAGNOSIS — Z951 Presence of aortocoronary bypass graft: Secondary | ICD-10-CM

## 2015-07-13 DIAGNOSIS — I2510 Atherosclerotic heart disease of native coronary artery without angina pectoris: Secondary | ICD-10-CM | POA: Diagnosis present

## 2015-07-13 DIAGNOSIS — I110 Hypertensive heart disease with heart failure: Principal | ICD-10-CM | POA: Diagnosis present

## 2015-07-13 DIAGNOSIS — I509 Heart failure, unspecified: Secondary | ICD-10-CM

## 2015-07-13 DIAGNOSIS — G4733 Obstructive sleep apnea (adult) (pediatric): Secondary | ICD-10-CM | POA: Diagnosis present

## 2015-07-13 DIAGNOSIS — I480 Paroxysmal atrial fibrillation: Secondary | ICD-10-CM | POA: Diagnosis present

## 2015-07-13 DIAGNOSIS — Z87891 Personal history of nicotine dependence: Secondary | ICD-10-CM

## 2015-07-13 DIAGNOSIS — Z9114 Patient's other noncompliance with medication regimen: Secondary | ICD-10-CM | POA: Diagnosis not present

## 2015-07-13 DIAGNOSIS — I5043 Acute on chronic combined systolic (congestive) and diastolic (congestive) heart failure: Secondary | ICD-10-CM | POA: Diagnosis present

## 2015-07-13 DIAGNOSIS — I10 Essential (primary) hypertension: Secondary | ICD-10-CM | POA: Diagnosis present

## 2015-07-13 DIAGNOSIS — Z6828 Body mass index (BMI) 28.0-28.9, adult: Secondary | ICD-10-CM

## 2015-07-13 DIAGNOSIS — I255 Ischemic cardiomyopathy: Secondary | ICD-10-CM | POA: Diagnosis present

## 2015-07-13 DIAGNOSIS — Z7901 Long term (current) use of anticoagulants: Secondary | ICD-10-CM | POA: Diagnosis not present

## 2015-07-13 DIAGNOSIS — E669 Obesity, unspecified: Secondary | ICD-10-CM | POA: Diagnosis present

## 2015-07-13 LAB — CBC
HEMATOCRIT: 43.5 % (ref 39.0–52.0)
HEMOGLOBIN: 13.6 g/dL (ref 13.0–17.0)
MCH: 28.9 pg (ref 26.0–34.0)
MCHC: 31.3 g/dL (ref 30.0–36.0)
MCV: 92.4 fL (ref 78.0–100.0)
Platelets: 262 10*3/uL (ref 150–400)
RBC: 4.71 MIL/uL (ref 4.22–5.81)
RDW: 14.5 % (ref 11.5–15.5)
WBC: 16.2 10*3/uL — ABNORMAL HIGH (ref 4.0–10.5)

## 2015-07-13 LAB — BASIC METABOLIC PANEL
Anion gap: 6 (ref 5–15)
BUN: 27 mg/dL — AB (ref 6–20)
CHLORIDE: 109 mmol/L (ref 101–111)
CO2: 24 mmol/L (ref 22–32)
CREATININE: 1.02 mg/dL (ref 0.61–1.24)
Calcium: 9 mg/dL (ref 8.9–10.3)
GFR calc non Af Amer: >60 mL/min (ref >60)
Glucose, Bld: 126 mg/dL — ABNORMAL HIGH (ref 65–99)
POTASSIUM: 4.8 mmol/L (ref 3.5–5.1)
SODIUM: 139 mmol/L (ref 135–145)

## 2015-07-13 LAB — BRAIN NATRIURETIC PEPTIDE: B NATRIURETIC PEPTIDE 5: 1130.2 pg/mL — AB (ref 0.0–100.0)

## 2015-07-13 LAB — I-STAT TROPONIN, ED: TROPONIN I, POC: 0.03 ng/mL (ref 0.00–0.08)

## 2015-07-13 MED ORDER — ONDANSETRON HCL 4 MG/2ML IJ SOLN: 4.0000 mg | Freq: Four times a day (QID) | INTRAMUSCULAR | Status: DC | PRN

## 2015-07-13 MED ORDER — ISOSORBIDE MONONITRATE ER 30 MG PO TB24
30.0000 mg | ORAL_TABLET | Freq: Every day | ORAL | Status: DC
  Administered 2015-07-14 – 2015-07-15 (×2): 30 mg via ORAL
  Filled 2015-07-13 (×2): qty 1

## 2015-07-13 MED ORDER — SODIUM CHLORIDE 0.9 % IJ SOLN
3.0000 mL | Freq: Two times a day (BID) | INTRAMUSCULAR | Status: DC
  Administered 2015-07-13 – 2015-07-15 (×4): 3 mL via INTRAVENOUS

## 2015-07-13 MED ORDER — METOPROLOL SUCCINATE ER 50 MG PO TB24
50.0000 mg | ORAL_TABLET | Freq: Two times a day (BID) | ORAL | Status: DC
  Administered 2015-07-13 – 2015-07-15 (×4): 50 mg via ORAL
  Filled 2015-07-13 (×4): qty 1

## 2015-07-13 MED ORDER — METOPROLOL SUCCINATE ER 25 MG PO TB24: 50.0000 mg | ORAL_TABLET | Freq: Two times a day (BID) | ORAL | Status: DC

## 2015-07-13 MED ORDER — FUROSEMIDE 10 MG/ML IJ SOLN
40.0000 mg | Freq: Once | INTRAMUSCULAR | Status: AC
  Administered 2015-07-13: 40 mg via INTRAVENOUS
  Filled 2015-07-13: qty 4

## 2015-07-13 MED ORDER — FUROSEMIDE 10 MG/ML IJ SOLN
40.0000 mg | Freq: Two times a day (BID) | INTRAMUSCULAR | Status: AC
  Administered 2015-07-13 – 2015-07-14 (×3): 40 mg via INTRAVENOUS
  Filled 2015-07-13 (×3): qty 4

## 2015-07-13 MED ORDER — ACETAMINOPHEN 325 MG PO TABS: 650.0000 mg | ORAL_TABLET | ORAL | Status: DC | PRN

## 2015-07-13 MED ORDER — METOPROLOL TARTRATE 25 MG PO TABS
25.0000 mg | ORAL_TABLET | Freq: Once | ORAL | Status: AC
  Administered 2015-07-13: 25 mg via ORAL
  Filled 2015-07-13: qty 1

## 2015-07-13 MED ORDER — ROSUVASTATIN CALCIUM 40 MG PO TABS
40.0000 mg | ORAL_TABLET | Freq: Every evening | ORAL | Status: DC
  Administered 2015-07-14: 40 mg via ORAL
  Filled 2015-07-13 (×2): qty 1

## 2015-07-13 MED ORDER — APIXABAN 5 MG PO TABS
5.0000 mg | ORAL_TABLET | Freq: Two times a day (BID) | ORAL | Status: DC
  Administered 2015-07-13 – 2015-07-15 (×4): 5 mg via ORAL
  Filled 2015-07-13 (×4): qty 1

## 2015-07-13 MED ORDER — SODIUM CHLORIDE 0.9 % IV SOLN: 250.0000 mL | INTRAVENOUS | Status: DC | PRN

## 2015-07-13 MED ORDER — SODIUM CHLORIDE 0.9 % IJ SOLN: 3.0000 mL | INTRAMUSCULAR | Status: DC | PRN

## 2015-07-13 NOTE — Progress Notes (Signed)
Patient arrived to 3E17 from the ED. Safety precautions and orders reviewed with patient. Call light within reach. TELE called and confirmed with Dwayne (CCMD). Will continue to monitor.   Sim BoastHavy, RN

## 2015-07-13 NOTE — ED Notes (Signed)
Dr Cooper with cardiology at bedside. 

## 2015-07-13 NOTE — ED Provider Notes (Signed)
CSN: [id number]     Arrival date & time 07/13/15  0840 History   First MD Initiated Contact with Patient 07/13/15 [phone]     Chief Complaint  Patient presents with  . Shortness of Breath  . Palpitations     (Consider location/radiation/quality/duration/timing/severity/associated sxs/prior Treatment) HPI Patient with history of coronary artery disease, a flutter and congestive heart failure presents with 3 days of increasing dyspnea on exertion and orthopnea. States he's had a dry nonproductive cough. Denies any fever or chills. Patient has no chest pain, lower extremity swelling or pain. Recent admission for initial fibrillation with RVR. Primary physician is Dr. Jeanie Sewer in [place] and cardiologist is Dr. Herbie Baltimore. Past Medical History  Diagnosis Date  . CAD (coronary artery disease)     a. s/p CABG 2003  . Paroxysmal atrial flutter (HCC)     a. s/p succesful TEE/DCCV on 06/01/14.  b. on Eliquis  . Chronic systolic CHF (congestive heart failure) (HCC)     a. 2D ECHO: 05/17/2014: EF 30-35%, mild concentric hypertrophy, diffuse hypokinesis, akinesis of the basalinferior myocardium, severe hypokinesis of the mid-apicalinferior myocardium. Aortic sclerosis, mild MR  . Cholecystitis   . HTN (hypertension)    Past Surgical History  Procedure Laterality Date  . Coronary artery bypass graft  2003    x 5  . Tee without cardioversion N/A 06/01/2014    Procedure: TRANSESOPHAGEAL ECHOCARDIOGRAM (TEE);  Surgeon: Lars Masson, MD;  Location: [facility] ENDOSCOPY;  Service: Cardiovascular;  Laterality: N/A;  . Cardioversion N/A 06/01/2014    Procedure: CARDIOVERSION;  Surgeon: Lars Masson, MD;  Location: [facility] ENDOSCOPY;  Service: Cardiovascular;  Laterality: N/A;  . Cardiac catheterization N/A 06/19/2014    Procedure: RIGHT/LEFT HEART CATH AND CORONARY/GRAFT ANGIOGRAPHY;  Surgeon: Lennette Bihari, MD;  Location: [facility] CATH LAB;  Service: Cardiovascular;  Laterality: N/A;   Family History  Problem  Relation Age of Onset  . Cancer Mother   . Cancer Father    Social History  Substance Use Topics  . Smoking status: Former Smoker -- 1.00 packs/day for 40 years    Types: Cigarettes    Quit date: 02/02/2002  . Smokeless tobacco: Never Used  . Alcohol Use: Yes     Comment: 6 pack of beer a week    Review of Systems  Constitutional: Negative for fever and chills.  Respiratory: Positive for cough and shortness of breath. Negative for wheezing.   Cardiovascular: Positive for palpitations. Negative for chest pain and leg swelling.  Gastrointestinal: Negative for nausea, vomiting and abdominal pain.  Musculoskeletal: Negative for back pain, neck pain and neck stiffness.  Skin: Negative for rash and wound.  Neurological: Positive for light-headedness. Negative for dizziness, syncope, weakness, numbness and headaches.  All other systems reviewed and are negative.     Allergies  Review of patient's allergies indicates no known allergies.  Home Medications   Prior to Admission medications   Medication Sig Start Date End Date Taking? Authorizing Provider  apixaban (ELIQUIS) 5 MG TABS tablet Take 1 tablet (5 mg total) by mouth 2 (two) times daily. 06/01/15  Yes Lennette Bihari, MD  furosemide (LASIX) 40 MG tablet Take 1 tablet (40 mg total) by mouth daily as needed (fluid weight gain of 3 pounds or greater). 07/07/15  Yes Dayna N Dunn, PA-C  isosorbide mononitrate (IMDUR) 30 MG 24 hr tablet Take 1 tablet (30 mg total) by mouth daily. 01/02/15  Yes Lennette Bihari, MD  metoprolol (LOPRESSOR) 50 MG tablet Take 1  tablet (50 mg total) by mouth 2 (two) times daily. 07/07/15  Yes Dayna N Dunn, PA-C  Multiple Vitamin (MULTIVITAMIN WITH MINERALS) TABS tablet Take 1 tablet by mouth daily.   Yes Historical Provider, MD  rosuvastatin (CRESTOR) 40 MG tablet Take 1 tablet (40 mg total) by mouth every evening. 07/07/15   Dayna N Dunn, PA-C   BP 138/120 mmHg  Pulse 117  Temp(Src) 97.6 F (36.4 C) (Oral)   Resp 24  Wt 223 lb (101.152 kg)  SpO2 99% Physical Exam  Constitutional: He is oriented to person, place, and time. He appears well-developed and well-nourished. No distress.  HENT:  Head: Normocephalic and atraumatic.  Mouth/Throat: Oropharynx is clear and moist. No oropharyngeal exudate.  Eyes: EOM are normal. Pupils are equal, round, and reactive to light.  Neck: Normal range of motion. Neck supple.  Cardiovascular:  Tachycardia. Irregularly irregular.  Pulmonary/Chest: Breath sounds normal. No respiratory distress. He has no wheezes. He has no rales. He exhibits no tenderness.  Increased respiratory effort but no distress.  Abdominal: Soft. Bowel sounds are normal. He exhibits no distension and no mass. There is no tenderness. There is no rebound and no guarding.  Musculoskeletal: Normal range of motion. He exhibits edema. He exhibits no tenderness.  Bilateral 1+ lower extremity edema. No calf swelling or tenderness. Distal pulses intact.  Neurological: He is alert and oriented to person, place, and time.  Moves all extremity is without deficit. Sensation is intact.  Skin: Skin is warm and dry. No rash noted. No erythema.  Psychiatric: He has a normal mood and affect. His behavior is normal.  Nursing note and vitals reviewed.   ED Course  Procedures (including critical care time) Labs Review Labs Reviewed  BASIC METABOLIC PANEL - Abnormal; Notable for the following:    Glucose, Bld 126 (*)    BUN 27 (*)    All other components within normal limits  CBC - Abnormal; Notable for the following:    WBC 16.2 (*)    All other components within normal limits  BRAIN NATRIURETIC PEPTIDE - Abnormal; Notable for the following:    B Natriuretic Peptide 1130.2 (*)    All other components within normal limits  I-STAT TROPOININ, ED    Imaging Review Dg Chest 2 View  07/13/2015  CLINICAL DATA:  Shortness of breath for 2 weeks. History of atrial fibrillation. EXAM: CHEST  2 VIEW  COMPARISON:  Two radiographs 07/05/2015 and 06/12/2005) FINDINGS: There is stable cardiac enlargement status post CABG. There is increased interstitial prominence consistent with mild edema. There are small bilateral pleural effusions. No confluent airspace opacity or pneumothorax. The bones appear unchanged. IMPRESSION: Interstitial pulmonary edema with small bilateral pleural effusions consistent with congestive heart failure. Electronically Signed   By: Carey BullocksWilliam  Veazey M.D.   On: 07/13/2015 09:31   I have personally reviewed and evaluated these images and lab results as part of my medical decision-making.   EKG Interpretation   Date/Time:  Friday July 13 2015 08:51:55 EST Ventricular Rate:  99 PR Interval:    QRS Duration: 98 QT Interval:  379 QTC Calculation: 486 R Axis:   93 Text Interpretation:  Right and left arm electrode reversal,  interpretation assumes no reversal Atrial fibrillation Ventricular  premature complex Anterior infarct, old Borderline repolarization  abnormality Confirmed by Ranae PalmsYELVERTON  MD, Shalla Bulluck ([id number]) on 07/13/2015 9:13:07  AM      MDM   Final diagnoses:  Acute on chronic congestive heart failure, unspecified congestive heart failure  type [facility])    Patient no respiratory distress. Blood pressure remained stable. Given dose of IV Lasix. Discuss with cardiology. Will see patient in emergency department and admit for CHF exacerbation.    Loren Racer, MD 07/13/15 [phone]

## 2015-07-13 NOTE — ED Notes (Signed)
Attempted report 

## 2015-07-13 NOTE — ED Notes (Signed)
Pt reports feeling his heart race "all week" and SOB.  pt reports recent visits for the same. Pt has hx of cardioversion. Denies chest pain.

## 2015-07-13 NOTE — H&P (Signed)
Cardiologist:  Stanley Wells is an 69 y.o. male.   Chief Complaint: Shortness of breath, orthopnea HPI:   Stanley Wells is a 69 year old Caucasian male with past medical history of OSA, HTN, chronic systolic heart failure, mod-severe MR, PAF, CAD s/p CABG x5 2003 who presented to [facility] 07/05/2015 with recurrent atrial fibrillation in the setting of stopping all medicines except for aspirin for 2 months.  Patient was discharged on December 3.  He first noted to have atrial fibrillation in October 2015. He was started on Eliquis as time. Echocardiogram showed EF 30-35% with diffuse hypokinesis, mild to moderate tricuspid regurg and moderate to severe mitral regurg. He underwent TEE/DC cardioversion. Afterward, he had more energy. He had a false positive stress test in 06/13/2014. He underwent cardiac catheterization on 06/19/2014 which showed patent LIMA to LAD, patent SVG to OM 2, occluded SVG to D1, occluded SVG to sequential PDA/PLA. Medical therapy was recommended. He had diagnostic polysomnography at [facility] on 09/06/2014 which revealed moderate obstructive and central sleep apnea with events being worse in the supine position. However patient did not wish to pursue CPAP.  He was seen by cardiology on 06/11/2015 for atrial fibrillation with RVR. At that time, he reported he was taking Toprol-XL along with Eliquis and he was cardioverted in the ED and discharged.  However, he was not taking his meds at that time.    During his recent admission he was placed on IV Cardizem and restart on metoprolol and Eliquis.  He appeared euvolemic on that exam and troponin elevation was felt to be due to atrial fibrillation. Diltiazem was stopped due to hypotension.. Acute kidney injury and lisinopril was held.  Patient presents back today with shortness of breath, increased weight, lower extremity edema, nausea, cough, abdominal distention and tightness.  He is supposed to be taking Lasix  as needed however, he is not weighing himself every day. His wife cooks healthy meals however, he makes some of his own, and eats what he wants with no concern for sodium content.  The patient currently denies vomiting, fever, dizziness, PND, cough, congestion, abdominal pain, hematochezia, melena.  His wife has been laying out all his medications for him which apparently he's been taking.    Past Medical History  Diagnosis Date  . CAD (coronary artery disease)     a. s/p CABG 2003  . Paroxysmal atrial flutter ([place])     a. s/p succesful TEE/DCCV on 06/01/14.  b. on Eliquis  . Chronic systolic CHF (congestive heart failure) ([place])     a. 2D ECHO: 05/17/2014: EF 30-35%, mild concentric hypertrophy, diffuse hypokinesis, akinesis of the basalinferior myocardium, severe hypokinesis of the mid-apicalinferior myocardium. Aortic sclerosis, mild MR  . Cholecystitis   . HTN (hypertension)     Past Surgical History  Procedure Laterality Date  . Coronary artery bypass graft  2003    x 5  . Tee without cardioversion N/A 06/01/2014    Procedure: TRANSESOPHAGEAL ECHOCARDIOGRAM (TEE);  Surgeon: Dorothy Spark, MD;  Location: [place];  Service: Cardiovascular;  Laterality: N/A;  . Cardioversion N/A 06/01/2014    Procedure: CARDIOVERSION;  Surgeon: Dorothy Spark, MD;  Location: [facility] ENDOSCOPY;  Service: Cardiovascular;  Laterality: N/A;  . Cardiac catheterization N/A 06/19/2014    Procedure: RIGHT/LEFT HEART CATH AND CORONARY/GRAFT ANGIOGRAPHY;  Surgeon: Troy Sine, MD;  Location: [facility] CATH LAB;  Service: Cardiovascular;  Laterality: N/A;    Family History  Problem Relation Age of  Onset  . Cancer Mother   . Cancer Father    Social History:  reports that he quit smoking about 13 years ago. His smoking use included Cigarettes. He has a 40 pack-year smoking history. He has never used smokeless tobacco. He reports that he drinks alcohol. He reports that he does not use illicit  drugs.  Allergies: No Known Allergies   (Not in a hospital admission)  Results for orders placed or performed during the hospital encounter of 07/13/15 (from the past 48 hour(s))  Basic metabolic panel     Status: Abnormal   Collection Time: 07/13/15  8:51 AM  Result Value Ref Range   Sodium 139 135 - 145 mmol/L   Potassium 4.8 3.5 - 5.1 mmol/L   Chloride 109 101 - 111 mmol/L   CO2 24 22 - 32 mmol/L   Glucose, Bld 126 (H) 65 - 99 mg/dL   BUN 27 (H) 6 - 20 mg/dL   Creatinine, Ser 1.02 0.61 - 1.24 mg/dL   Calcium 9.0 8.9 - 10.3 mg/dL   GFR calc non Af Amer >60 >60 mL/min   GFR calc Af Amer >60 >60 mL/min    Comment: (NOTE) The eGFR has been calculated using the CKD EPI equation. This calculation has not been validated in all clinical situations. eGFR's persistently <60 mL/min signify possible Chronic Kidney Disease.    Anion gap 6 5 - 15  CBC     Status: Abnormal   Collection Time: 07/13/15  8:51 AM  Result Value Ref Range   WBC 16.2 (H) 4.0 - 10.5 K/uL   RBC 4.71 4.22 - 5.81 MIL/uL   Hemoglobin 13.6 13.0 - 17.0 g/dL   HCT 43.5 39.0 - 52.0 %   MCV 92.4 78.0 - 100.0 fL   MCH 28.9 26.0 - 34.0 pg   MCHC 31.3 30.0 - 36.0 g/dL   RDW 14.5 11.5 - 15.5 %   Platelets 262 150 - 400 K/uL  Brain natriuretic peptide     Status: Abnormal   Collection Time: 07/13/15  9:00 AM  Result Value Ref Range   B Natriuretic Peptide 1130.2 (H) 0.0 - 100.0 pg/mL  I-stat troponin, ED (not at [facility])     Status: None   Collection Time: 07/13/15  9:06 AM  Result Value Ref Range   Troponin i, poc 0.03 0.00 - 0.08 ng/mL   Comment 3            Comment: Due to the release kinetics of cTnI, a negative result within the first hours of the onset of symptoms does not rule out myocardial infarction with certainty. If myocardial infarction is still suspected, repeat the test at appropriate intervals.    Dg Chest 2 View  07/13/2015  CLINICAL DATA:  Shortness of breath for 2 weeks. History of atrial  fibrillation. EXAM: CHEST  2 VIEW COMPARISON:  Two radiographs 07/05/2015 and 06/12/2005) FINDINGS: There is stable cardiac enlargement status post CABG. There is increased interstitial prominence consistent with mild edema. There are small bilateral pleural effusions. No confluent airspace opacity or pneumothorax. The bones appear unchanged. IMPRESSION: Interstitial pulmonary edema with small bilateral pleural effusions consistent with congestive heart failure. Electronically Signed   By: Richardean Sale M.D.   On: 07/13/2015 09:31    Review of Systems  Constitutional: Negative for fever and diaphoresis.  HENT: Negative for congestion.   Respiratory: Positive for cough and shortness of breath.   Cardiovascular: Positive for orthopnea, leg swelling and PND. Negative for  chest pain and palpitations.  Gastrointestinal: Positive for nausea and abdominal pain. Negative for vomiting, blood in stool and melena.  Musculoskeletal: Negative for myalgias.  Neurological: Negative for dizziness.  All other systems reviewed and are negative.   Blood pressure 138/120, pulse 117, temperature 97.6 F (36.4 C), temperature source Oral, resp. rate 24, weight 223 lb (101.152 kg), SpO2 99 %. Physical Exam  Nursing note and vitals reviewed. Constitutional: He is oriented to person, place, and time. He appears well-developed. No distress.  Obese  HENT:  Head: Normocephalic and atraumatic.  Mouth/Throat: No oropharyngeal exudate.  Eyes: EOM are normal. Pupils are equal, round, and reactive to light. No scleral icterus.  Neck: Normal range of motion. Neck supple. JVD present.  Cardiovascular: S1 normal and S2 normal.  An irregularly irregular rhythm present. Tachycardia present.   No murmur heard. Pulses:      Radial pulses are 2+ on the right side, and 2+ on the left side.  Respiratory: Effort normal. He has no wheezes. He has rales.  GI: Soft. Bowel sounds are normal. He exhibits distension. There is no  tenderness.  Dull percussion  Musculoskeletal: He exhibits edema.  Lymphadenopathy:    He has cervical adenopathy.  Neurological: He is alert and oriented to person, place, and time. He exhibits normal muscle tone.  Skin: Skin is warm and dry.  Psychiatric: He has a normal mood and affect.     Assessment/Plan Principal Problem:   Acute on chronic combined systolic and diastolic heart failure, NYHA class 2 (HCC) Active Problems:   Coronary artery disease involving native coronary artery without angina pectoris   Cardiomyopathy, ischemic: EF 30-35%   OSA (obstructive sleep apnea)   Essential hypertension   Long term current use of anticoagulant therapy - Eliquis   Paroxysmal atrial fibrillation with RVR ([place])   Noncompliance with medication regimen  Patient will be admitted to [place]. He needs IV diuretics as his weight is up about 11 pounds since his discharge one week ago.  BNP is elevated.  JVD is elevated. chest x-ray shows interstitial pulmonary edema and small bilateral pleural effusions.  His kidney function has improved compared to his labs on December 3.  Continue home medications.  Monitor potassium and kidney function and blood pressure.  No ACE or ARB due to hypotension last admission.  He is on long acting nitrates.  Dietary compliance as well as weight monitoring was discussed.  He took his metoprolol this morning.  Will give another 25 mg x 1 now to help BP and HR.  The previous plan was to consider DCCV after 4 weeks of eliquis as an OP.   Tarri Fuller, PA-C 07/13/2015, 12:21 PM  Patient seen, examined. Available data reviewed. Agree with findings, assessment, and plan as outlined by Tarri Fuller, PA-C. Data is reviewed from the chart. The patient has a history of medication noncompliance, but reports good adherence since his recent hospital discharge. On exam, he is alert and oriented in no distress. Jugular venous pressure is elevated to the angle of the jaw. Lungs with  bibasilar rales. Heart is irregularly irregular without murmur. Abdomen is soft and nontender but moderately distended. Extremities have trace edema.  In summary, he has atrial fibrillation and acute on chronic systolic heart failure with clinical exam evidence of volume overload. As detailed above, he will be admitted for IV diuresis. With LV dysfunction, will change his beta blocker from metoprolol tartrate to metoprolol succinate twice daily dosing.  Sherren Mocha, M.D.  07/13/2015 2:30 PM

## 2015-07-13 NOTE — ED Notes (Signed)
Cardiology at bedside.

## 2015-07-14 LAB — BASIC METABOLIC PANEL
Anion gap: 7 (ref 5–15)
BUN: 26 mg/dL — AB (ref 6–20)
CHLORIDE: 105 mmol/L (ref 101–111)
CO2: 29 mmol/L (ref 22–32)
CREATININE: 1.15 mg/dL (ref 0.61–1.24)
Calcium: 8.7 mg/dL — ABNORMAL LOW (ref 8.9–10.3)
GFR calc Af Amer: >60 mL/min (ref >60)
GFR calc non Af Amer: >60 mL/min (ref >60)
GLUCOSE: 115 mg/dL — AB (ref 65–99)
Potassium: 3.9 mmol/L (ref 3.5–5.1)
Sodium: 141 mmol/L (ref 135–145)

## 2015-07-14 NOTE — Progress Notes (Signed)
ptmhad episode of bradycardia. HR went down to the 20. And quickly went back to the low 100's. Pt asymptomatic. Dr Antoine PocheHochrein paged and made aware of bradycardic episode. No orders received. Continued to closely monitor patient

## 2015-07-14 NOTE — Discharge Instructions (Signed)

## 2015-07-14 NOTE — Progress Notes (Signed)
Patient ID: Debby FreibergJames C Vollmer, male   DOB: 12/18/1945, 69 y.o.   MRN: [id number]     Subjective:   SOB improved   Objective:   Temp:  [97.1 F (36.2 C)-98 F (36.7 C)] 98 F (36.7 C) (12/10 1140) Pulse Rate:  [44-125] 61 (12/10 1140) Resp:  [16-32] 16 (12/10 0512) BP: (124-169)/(81-133) 124/82 mmHg (12/10 1140) SpO2:  [92 %-99 %] 98 % (12/10 1140) Weight:  [209 lb 11.2 oz (95.119 kg)-214 lb 8 oz (97.297 kg)] 209 lb 11.2 oz (95.119 kg) (12/10 0512)    Filed Weights   07/13/15 1030 07/13/15 1737 07/14/15 0512  Weight: 223 lb (101.152 kg) 214 lb 8 oz (97.297 kg) 209 lb 11.2 oz (95.119 kg)    Intake/Output Summary (Last 24 hours) at 07/14/15 1239 Last data filed at 07/14/15 1100  Gross per 24 hour  Intake   1340 ml  Output   4575 ml  Net  -3235 ml    Telemetry: afib with some elevated rates.   Exam:  General: NAD  Resp: crackles bilateral bases  Cardiac: irreg, normal rate, no m/r/g, JVD to angle of jaw  GI: abdomen soft, NT, ND  MSK: no LE edema  Neuro: no focal deficits  Psych: appropriate affect  Lab Results:  Basic Metabolic Panel:  Recent Labs Lab 07/13/15 0851 07/14/15 0326  NA 139 141  K 4.8 3.9  CL 109 105  CO2 24 29  GLUCOSE 126* 115*  BUN 27* 26*  CREATININE 1.02 1.15  CALCIUM 9.0 8.7*    Liver Function Tests: No results for input(s): AST, ALT, ALKPHOS, BILITOT, PROT, ALBUMIN in the last 168 hours.  CBC:  Recent Labs Lab 07/13/15 0851  WBC 16.2*  HGB 13.6  HCT 43.5  MCV 92.4  PLT 262    Cardiac Enzymes: No results for input(s): CKTOTAL, CKMB, CKMBINDEX, TROPONINI in the last 168 hours.  BNP: No results for input(s): PROBNP in the last 8760 hours.  Coagulation: No results for input(s): INR in the last 168 hours.  ECG:   Medications:   Scheduled Medications: . apixaban  5 mg Oral BID  . furosemide  40 mg Intravenous BID  . isosorbide mononitrate  30 mg Oral Daily  . metoprolol succinate  50 mg Oral BID  .  rosuvastatin  40 mg Oral QPM  . sodium chloride  3 mL Intravenous Q12H     Infusions:     PRN Medications:  sodium chloride, acetaminophen, ondansetron (ZOFRAN) IV, sodium chloride     Assessment/Plan   1. Acute on chronic combined systolic/diastolic HF - 05/2014 echo LVEF 30-35%, diastolic function not described. - weight up 11 lbs on admission. History of medication noncompliance - negative 3.2 liters since admission, he is on lasix IV 40mg  bid. Mild uptrend in Cr.  - no ACE due to history of low bps - still with some fluid overload, continue IV diuretics today, likely change to oral tomorrow.   2. Afib - rate controlled with Toprol - has been on eliquis - notes mention consideration for possible outpatient DCCV at some point, however has not been on anticoag 3 weeks.  - some elevated rates, Toprol XL increased to 50mg  bid just last night, will follow rates.     Dina RichJonathan Cambell Rickenbach, M.D.

## 2015-07-15 LAB — BASIC METABOLIC PANEL
ANION GAP: 6 (ref 5–15)
BUN: 24 mg/dL — AB (ref 6–20)
CHLORIDE: 101 mmol/L (ref 101–111)
CO2: 32 mmol/L (ref 22–32)
Calcium: 8.8 mg/dL — ABNORMAL LOW (ref 8.9–10.3)
Creatinine, Ser: 1.26 mg/dL — ABNORMAL HIGH (ref 0.61–1.24)
GFR calc Af Amer: >60 mL/min (ref >60)
GFR calc non Af Amer: 57 mL/min — ABNORMAL LOW (ref >60)
Glucose, Bld: 122 mg/dL — ABNORMAL HIGH (ref 65–99)
POTASSIUM: 3.8 mmol/L (ref 3.5–5.1)
SODIUM: 139 mmol/L (ref 135–145)

## 2015-07-15 MED ORDER — METOPROLOL SUCCINATE ER 50 MG PO TB24: 50.0000 mg | ORAL_TABLET | Freq: Two times a day (BID) | ORAL | Status: DC

## 2015-07-15 NOTE — Progress Notes (Signed)
Pt d/c'd home with wife.  D/c instructions given to and d/w  Pt. Pt verbalizes understanding.

## 2015-07-15 NOTE — Progress Notes (Signed)
Patient ID: Stanley FreibergJames C Ramsay, male   DOB: 08/30/1945, 69 y.o.   MRN: [id number]     Subjective:    SOB resolved  Objective:   Temp:  [97.5 F (36.4 C)-98 F (36.7 C)] 98 F (36.7 C) (12/11 0828) Pulse Rate:  [61-114] 80 (12/11 0941) Resp:  [16-18] 18 (12/11 0828) BP: (124-148)/(79-110) 148/92 mmHg (12/11 0941) SpO2:  [97 %-100 %] 100 % (12/11 0828) Weight:  [205 lb 11.2 oz (93.305 kg)] 205 lb 11.2 oz (93.305 kg) (12/11 0445) Last BM Date: 07/14/15  Filed Weights   07/13/15 1737 07/14/15 0512 07/15/15 0445  Weight: 214 lb 8 oz (97.297 kg) 209 lb 11.2 oz (95.119 kg) 205 lb 11.2 oz (93.305 kg)    Intake/Output Summary (Last 24 hours) at 07/15/15 1016 Last data filed at 07/15/15 0900  Gross per 24 hour  Intake    360 ml  Output   3050 ml  Net  -2690 ml    Telemetry: afib  Exam:  General: NAD  HEENT: sclera clear  Resp: CTAB  Cardiac: irreg, no m/r/g, no jvd  GI: abdomen soft, NT, ND  MSK:no LE edema  Neuro: no focal deficits  Psych: appropriate affect  Lab Results:  Basic Metabolic Panel:  Recent Labs Lab 07/13/15 0851 07/14/15 0326 07/15/15 0300  NA 139 141 139  K 4.8 3.9 3.8  CL 109 105 101  CO2 24 29 32  GLUCOSE 126* 115* 122*  BUN 27* 26* 24*  CREATININE 1.02 1.15 1.26*  CALCIUM 9.0 8.7* 8.8*    Liver Function Tests: No results for input(s): AST, ALT, ALKPHOS, BILITOT, PROT, ALBUMIN in the last 168 hours.  CBC:  Recent Labs Lab 07/13/15 0851  WBC 16.2*  HGB 13.6  HCT 43.5  MCV 92.4  PLT 262    Cardiac Enzymes: No results for input(s): CKTOTAL, CKMB, CKMBINDEX, TROPONINI in the last 168 hours.  BNP: No results for input(s): PROBNP in the last 8760 hours.  Coagulation: No results for input(s): INR in the last 168 hours.  ECG:   Medications:   Scheduled Medications: . apixaban  5 mg Oral BID  . isosorbide mononitrate  30 mg Oral Daily  . metoprolol succinate  50 mg Oral BID  . rosuvastatin  40 mg Oral QPM  . sodium  chloride  3 mL Intravenous Q12H     Infusions:     PRN Medications:  sodium chloride, acetaminophen, ondansetron (ZOFRAN) IV, sodium chloride     Assessment/Plan   1. Acute on chronic combined systolic/diastolic HF - 05/2014 echo LVEF 30-35%, diastolic function not described. - weight up 11 lbs on admission. History of medication noncompliance - negative 2.6 liters yesterday and negative 5.5 liters since admission, he is on lasix IV 40mg  bid. Mild uptrend in Cr.  - no ACE due to history of low bps according to notes - will stop IV lasix. Restart his home lasix, will continue as prn at this time. Educated on importance of daily weights and monitoring for edema or SOB as triggers to take his lasix.    2. Afib - rate controlled with Toprol - has been on eliquis - notes mention consideration for possible outpatient DCCV at some point, however has not been on anticoag 3 weeks.  - some elevated rates, Toprol XL increased to 50mg  bid just last night, will follow rates.    His appointment for tomorrow with Dr Herbie BaltimoreHarding will need to be rescheduled for 2-3 weeks.     Dina RichJonathan Chatara Lucente,  M.D.

## 2015-07-15 NOTE — Discharge Summary (Signed)
Physician Discharge Summary   Cardiologist:   Herbie BaltimoreHarding  Patient ID: Stanley [name]: [id number] DOB/AGE: 68/04/1946 68 y.o.  Admit date: 07/13/2015 Discharge date: 07/15/2015  Admission Diagnoses:  Acute on chronic combined systolic and diastolic heart failure  Discharge Diagnoses:  Principal Problem:   Acute on chronic combined systolic and diastolic heart failure, NYHA class 2 (HCC) Active Problems:   Coronary artery disease involving native coronary artery without angina pectoris   Cardiomyopathy, ischemic: EF 30-35%   OSA (obstructive sleep apnea)   Essential hypertension   Long term current use of anticoagulant therapy - Eliquis   Paroxysmal atrial fibrillation with RVR (HCC)   Noncompliance with medication regimen   Acute on chronic combined systolic and diastolic congestive heart failure, NYHA class 1 (HCC)   Discharged Condition: stable  Hospital Course:  Stanley Wells is a 69 year old Caucasian male with past medical history of OSA, HTN, chronic systolic heart failure, mod-severe MR, PAF, CAD s/p CABG x5 2003 who [facility] with recurrent atrial fibrillation in the setting of stopping all medicines except for aspirin for 2 months. Patient was discharged on December 3. He first noted to have atrial fibrillation in October 2015. He was started on Eliquis as time. Echocardiogram showed EF 30-35% with diffuse hypokinesis, mild to moderate tricuspid regurg and moderate to severe mitral regurg. He underwent TEE/DC cardioversion. Afterward, he had more energy. He had a false positive stress test in 06/13/2014. He underwent cardiac catheterization on 06/19/2014 which showed patent LIMA to LAD, patent SVG to OM 2, occluded SVG to D1, occluded SVG to sequential PDA/PLA. Medical therapy was recommended. He had [facility] 09[facility]06/2014 which revealed moderate obstructive and central sleep apnea with events being  worse in the supine position. However patient did not wish to pursue CPAP. He was seen by cardiology on 06/11/2015 for atrial fibrillation with RVR. At that time, he reported he was taking Toprol-XL along with Eliquis and he was cardioverted in the ED and discharged. However, he was not taking his meds at that time.  During his recent admission he was placed on IV Cardizem and restart on metoprolol and Eliquis. He appeared euvolemic on that exam and troponin elevation was felt to be due to atrial fibrillation. Diltiazem was stopped due to hypotension.. Acute kidney injury and lisinopril was held. Patient presents back today with shortness of breath, increased weight, lower extremity edema, nausea, cough, abdominal distention and tightness. He is supposed to be taking Lasix as needed however, he is not weighing himself every day. His wife cooks healthy meals however, he makes some of his own, and eats what he wants with no concern for sodium content. The patient currently denies vomiting, fever, dizziness, PND, cough, congestion, abdominal pain, hematochezia, melena. His wife has been laying out all his medications for him which apparently he's been taking.  Patient was admitted for IV diuresis.  Ultimately diuresis 5.5 L.  Shortness of breath resolved. Change his Lopressor to metoprolol succinate 50 mg twice daily. Get a mild bump in his serum creatinine the day of his discharge.  He is on ACE inhibitor due to history of low blood pressures.  He was restarted on home Lasix which is when necessary. He was educated on the importance of monitoring his weight daily as well as low sodium diet. His A. fib rate is controlled after metoprolol was increased. He is on Eliquis.  The previous plan was to consider DCCV after 4 weeks  of eliquis as an OP. The patient was seen by Dr. Wyline Mood who felt he was stable for DC home.   Consults: None  Significant Diagnostic Studies:    CHEST 2 VIEW  COMPARISON: Two radiographs 07/05/2015 and 06/12/2005)  FINDINGS: There is stable cardiac enlargement status post CABG. There is increased interstitial prominence consistent with mild edema. There are small bilateral pleural effusions. No confluent airspace opacity or pneumothorax. The bones appear unchanged.  IMPRESSION: Interstitial pulmonary edema with small bilateral pleural effusions consistent with congestive heart failure.  Treatments: See above  Discharge Exam: Blood pressure 148/92, pulse 80, temperature 98 F (36.7 C), temperature source Oral, resp. rate 18, height  (1.803 m), weight 205 lb 11.2 oz (93.305 kg), SpO2 100 %.   Disposition: 01-Home or Self Care      Discharge Instructions    Diet - low sodium heart healthy    Complete by:  As directed      Discharge instructions    Complete by:  As directed   Monitor your weight every morning.  If you gain 3 pounds in 24 hours, or 5 pounds in a week, call the office for instructions.            Medication List    STOP taking these medications        metoprolol 50 MG tablet  Commonly known as:  LOPRESSOR      TAKE these medications        apixaban 5 MG Tabs tablet  Commonly known as:  ELIQUIS  Take 1 tablet (5 mg total) by mouth 2 (two) times daily.     furosemide 40 MG tablet  Commonly known as:  LASIX  Take 1 tablet (40 mg total) by mouth daily as needed (fluid weight gain of 3 pounds or greater).     isosorbide mononitrate 30 MG 24 hr tablet  Commonly known as:  IMDUR  Take 1 tablet (30 mg total) by mouth daily.     metoprolol succinate 50 MG 24 hr tablet  Commonly known as:  TOPROL-XL  Take 1 tablet (50 mg total) by mouth 2 (two) times daily. Take with or immediately following a meal.     multivitamin with minerals Tabs tablet  Take 1 tablet by mouth daily.     rosuvastatin 40 MG tablet  Commonly known as:  CRESTOR  Take 1 tablet (40 mg total) by mouth every evening.        Follow-up Information    Follow up with Marykay Lex, MD On 08/13/2015.   Specialty:  Cardiology   Why:  9:30 AM   Contact information:   [address] [place] [address] [phone]       Follow up with Marykay Lex, MD.   Specialty:  Cardiology   Why:  This appointment will be the week of the 19th with one of the PAs.  The office will call you tomorrow with the date and time   Contact information:   [address] [place] [address] [phone]      Greater than 30 minutes was spent completing the patient's discharge.    SignedWilburt Finlay, PA-C 07/15/2015, 10:50 AM

## 2015-07-16 ENCOUNTER — Telehealth: Payer: Self-pay | Admitting: Physician Assistant

## 2015-07-16 ENCOUNTER — Ambulatory Visit: Payer: Medicare Other | Admitting: Physician Assistant

## 2015-07-16 NOTE — Telephone Encounter (Signed)
Patient [facility] on 07/15/[facility].  Patient [facility]. Patient understands discharge instructions? yes  Patient understands medications and regiment? yes  Patient understands to bring all medications to this visit? yes

## 2015-07-16 NOTE — Telephone Encounter (Signed)
D/C phone Call .Marland Kitchen. Appt is on 07/23/15 at 2pm w/ Theodore Demarkhonda Barrett [facility] .Marland Kitchen.  Thanks

## 2015-07-23 ENCOUNTER — Encounter: Payer: Self-pay | Admitting: *Deleted

## 2015-07-23 ENCOUNTER — Other Ambulatory Visit: Payer: Self-pay | Admitting: *Deleted

## 2015-07-23 ENCOUNTER — Ambulatory Visit (INDEPENDENT_AMBULATORY_CARE_PROVIDER_SITE_OTHER): Payer: Medicare Other | Admitting: Physician Assistant

## 2015-07-23 ENCOUNTER — Encounter: Payer: Self-pay | Admitting: Physician Assistant

## 2015-07-23 VITALS — BP 150/90 | HR 94 | Ht 71.0 in | Wt 213.0 lb

## 2015-07-23 DIAGNOSIS — I255 Ischemic cardiomyopathy: Secondary | ICD-10-CM

## 2015-07-23 DIAGNOSIS — Z7901 Long term (current) use of anticoagulants: Secondary | ICD-10-CM | POA: Diagnosis not present

## 2015-07-23 DIAGNOSIS — I480 Paroxysmal atrial fibrillation: Secondary | ICD-10-CM | POA: Diagnosis not present

## 2015-07-23 DIAGNOSIS — I4891 Unspecified atrial fibrillation: Secondary | ICD-10-CM

## 2015-07-23 NOTE — Progress Notes (Signed)
Cardiology Office Note   Date:  07/23/2015   ID:  Stanley FreibergJames C Wells, DOB 12/12/1945, MRN [id number]  PCP:  Stanley SaupeEDDING II,[name], MD  Cardiologist:  Dr Lauraine RinneHarding  Stanley Riccardi, PA-C   No chief complaint on file.   History of Present Illness: Stanley Wells is a 69 y.o. male with a history of OSA, HTN, chronic systolic heart failure, mod-severe MR, PAF on Eliquis, CAD s/p CABG x5 2003. Admitted 12/09-12/11 with acute on chronic CHF. In afib, possible DCCV planned in 4 weeks.   Winn JockJames C Wells presents for  Follow-up of his atrial fibrillation.   Since discharge from the hospital, he has not missed a dose of Eliquis. According to his wife,  He will have been on it for 3 weeks this Thursday.   He struggles with the atrial fibrillation. He has significant fatigue and dyspnea on exertion. He does very little. He does not exercise in this been sitting mostly in the recliner. He has not had chest pain. He just doesn't feel like doing anything. He has not had edema, orthopnea or PND. He does not have palpitations. He just feels bad.   He is looking forward to getting cardioverted because he believes he will feel much better when he is back in sinus rhythm.   Past Medical History  Diagnosis Date  . CAD (coronary artery disease)     a. s/p CABG 2003  . Paroxysmal atrial flutter (HCC)     a. s/p succesful TEE/DCCV on 06/01/14.  b. on Eliquis  . Chronic systolic CHF (congestive heart failure) (HCC)     a. 2D ECHO: 05/17/2014: EF 30-35%, mild concentric hypertrophy, diffuse hypokinesis, akinesis of the basalinferior myocardium, severe hypokinesis of the mid-apicalinferior myocardium. Aortic sclerosis, mild MR  . Cholecystitis   . HTN (hypertension)     Past Surgical History  Procedure Laterality Date  . Coronary artery bypass graft  2003    x 5  . Tee without cardioversion N/A 06/01/2014    Procedure: TRANSESOPHAGEAL ECHOCARDIOGRAM (TEE);  Surgeon: Stanley MassonKatarina H Nelson, MD;   [facility];  Service: Cardiovascular;  Laterality: N/A;  . Cardioversion N/A 06/01/2014    Procedure: CARDIOVERSION;  Surgeon: Stanley MassonKatarina H Nelson, MD;  [facility];  Service: Cardiovascular;  Laterality: N/A;  . Cardiac catheterization N/A 06/19/2014    Procedure: RIGHT/LEFT HEART CATH AND CORONARY/GRAFT ANGIOGRAPHY;  Surgeon: Stanley Biharihomas A Kelly, MD;  [facility];  Service: Cardiovascular;  Laterality: N/A;    Current Outpatient Prescriptions  Medication Sig Dispense Refill  . apixaban (ELIQUIS) 5 MG TABS tablet Take 1 tablet (5 mg total) by mouth 2 (two) times daily. 60 tablet 11  . furosemide (LASIX) 40 MG tablet Take 1 tablet (40 mg total) by mouth daily as needed (fluid weight gain of 3 pounds or greater). 30 tablet 1  . isosorbide mononitrate (IMDUR) 30 MG 24 hr tablet Take 1 tablet (30 mg total) by mouth daily. 30 tablet 6  . metoprolol succinate (TOPROL-XL) 50 MG 24 hr tablet Take 1 tablet (50 mg total) by mouth 2 (two) times daily. Take with or immediately following a meal. 60 tablet 11  . Multiple Vitamin (MULTIVITAMIN WITH MINERALS) TABS tablet Take 1 tablet by mouth daily.    . rosuvastatin (CRESTOR) 40 MG tablet Take 1 tablet (40 mg total) by mouth every evening. 30 tablet 3  . [DISCONTINUED] amLODipine (NORVASC) 5 MG tablet Take 1 tablet (5 mg total) by mouth daily. 30 tablet 6  No current facility-administered medications for this visit.    Allergies:   Review of patient's allergies indicates no known allergies.    Social History:  The patient  reports that he quit smoking about 13 years ago. His smoking use included Cigarettes. He has a 40 pack-year smoking history. He has never used smokeless tobacco. He reports that he drinks alcohol. He reports that he does not use illicit drugs.   Family History:  The patient's family history includes Cancer in his father and mother. There is no history of Hypertension or Stroke.    ROS:  Please see the  history of present illness. All other systems are reviewed and negative.    PHYSICAL EXAM: VS:  BP 150/90 mmHg  Pulse 94  Ht  (1.803 m)  Wt 213 lb (96.616 kg)  BMI 29.72 kg/m2 , BMI Body mass index is 29.72 kg/(m^2). GEN: Well nourished, well developed, male in no acute distress HEENT: normal for age  Neck: no JVD, no carotid bruit, no masses Cardiac: irregular rate and rhythm; soft murmur, no rubs, or gallops Respiratory:  clear to auscultation bilaterally, normal work of breathing GI: soft, nontender, nondistended, + BS MS: no deformity or atrophy; no edema; distal pulses are 2+ in all 4 extremities  Skin: warm and dry, no rash Neuro:  Strength and sensation are intact Psych: euthymic mood, full affect   EKG:  EKG is ordered today. The ekg ordered today demonstrates atrial fibrillation, heart rate 94   Recent Labs: 10/03/2014: TSH 1.124 05/01/2015: ALT 21 07/05/2015: Magnesium 2.1 07/13/2015: B Natriuretic Peptide 1130.2*; Hemoglobin 13.6; Platelets 262 07/15/2015: BUN 24*; Creatinine, Ser 1.26*; Potassium 3.8; Sodium 139    Lipid Panel    Component Value Date/Time   CHOL 213* 05/01/2015 0942   TRIG 245* 05/01/2015 0942   HDL 38* 05/01/2015 0942   CHOLHDL 5.6* 05/01/2015 0942   VLDL 49* 05/01/2015 0942   LDLCALC 126 05/01/2015 0942     Wt Readings from Last 3 Encounters:  07/23/15 213 lb (96.616 kg)  07/15/15 205 lb 11.2 oz (93.305 kg)  07/07/15 212 lb 11.2 oz (96.48 kg)     Other studies Reviewed: Additional studies/ records that were reviewed today include:  Hospital records and office notes.  ASSESSMENT AND PLAN:  1.   Atrial fibrillation: the patient is tolerating poorly. He has been compliant with his doses of Eliquis. His wife tracks's carefully and he is been on it for almost 3 weeks. Therefore, he will be able to be cardioverted next week. We will set up a cardioversion for the end of next week. He will have been there few date: Eliquis for 4 weeks  and TEE will not be needed. Dr. Herbie Baltimore will be able to perform the procedure. His heart rate is controlled and no medication changes will be made at this time.   2. Chronic anticoagulation : Eliquis. This patients CHA2DS2-VASc Score and unadjusted Ischemic Stroke Rate (% per year) is equal to 4.8 % stroke rate/year from a score of 4 Above score calculated as 1 point each if present [CHF, HTN, DM, Vascular=MI/PAD/Aortic Plaque, Age if 65-74, or Male], 2 points each if present [Age > 75, or Stroke/TIA/TE]   3. Ischemic cardiomyopathy: his weight is up at his volume status is stable. He is to continue current therapy and contact us for any changes in his respiratory status  Current medicines are reviewed at length with the patient today.  The patient does not have concerns regarding medicines.  The following changes have been made:  no change  Labs/ tests ordered today include:  No orders of the defined types were placed in this encounter.     Disposition:   FU with  Dr. Harding  Signed, Gehrig Patras, PA-C  07/23/2015 2:35 PM    [facility] [address], [place], [place]  [address] Phone: [phone]; Fax: [phone]     

## 2015-07-23 NOTE — Patient Instructions (Signed)
Medication Instructions:   NO CHANGE  Testing/Procedures:  Your physician has recommended that you have a Cardioversion (DCCV). Electrical Cardioversion uses a jolt of electricity to your heart either through paddles or wired patches attached to your chest. This is a controlled, usually prescheduled, procedure. Defibrillation is done under light anesthesia in the hospital, and you usually go home the day of the procedure. This is done to get your heart back into a normal rhythm. You are not awake for the procedure. Please see the instruction sheet given to you today.    Follow-Up:  Your physician recommends that you schedule a follow-up appointment in: NEXT AVAILABLE [facility]   If you need a refill on your cardiac medications before your next appointment, please call your pharmacy.

## 2015-07-27 ENCOUNTER — Ambulatory Visit: Payer: Medicare Other | Admitting: Cardiovascular Disease

## 2015-08-02 ENCOUNTER — Ambulatory Visit (HOSPITAL_COMMUNITY): Payer: Medicare Other | Admitting: Anesthesiology

## 2015-08-02 ENCOUNTER — Encounter (HOSPITAL_COMMUNITY)
Admission: RE | Disposition: A | Discharge status: Home / Self Care | Payer: Self-pay | Source: Ambulatory Visit | Attending: Internal Medicine

## 2015-08-02 ENCOUNTER — Inpatient Hospital Stay (HOSPITAL_COMMUNITY): Payer: Medicare Other

## 2015-08-02 ENCOUNTER — Encounter (HOSPITAL_COMMUNITY): Payer: Self-pay | Admitting: *Deleted

## 2015-08-02 ENCOUNTER — Inpatient Hospital Stay (HOSPITAL_COMMUNITY)
Admission: RE | Admit: 2015-08-02 | Discharge: 2015-08-04 | DRG: 292 | Disposition: A | Discharge status: Home / Self Care | Payer: Medicare Other | Source: Ambulatory Visit | Attending: Internal Medicine | Admitting: Internal Medicine

## 2015-08-02 DIAGNOSIS — I255 Ischemic cardiomyopathy: Secondary | ICD-10-CM | POA: Diagnosis present

## 2015-08-02 DIAGNOSIS — I5023 Acute on chronic systolic (congestive) heart failure: Secondary | ICD-10-CM | POA: Diagnosis not present

## 2015-08-02 DIAGNOSIS — I481 Persistent atrial fibrillation: Secondary | ICD-10-CM | POA: Diagnosis not present

## 2015-08-02 DIAGNOSIS — I252 Old myocardial infarction: Secondary | ICD-10-CM | POA: Diagnosis not present

## 2015-08-02 DIAGNOSIS — Z951 Presence of aortocoronary bypass graft: Secondary | ICD-10-CM

## 2015-08-02 DIAGNOSIS — Z87891 Personal history of nicotine dependence: Secondary | ICD-10-CM

## 2015-08-02 DIAGNOSIS — I4892 Unspecified atrial flutter: Secondary | ICD-10-CM | POA: Diagnosis present

## 2015-08-02 DIAGNOSIS — Z7901 Long term (current) use of anticoagulants: Secondary | ICD-10-CM | POA: Diagnosis not present

## 2015-08-02 DIAGNOSIS — I509 Heart failure, unspecified: Secondary | ICD-10-CM | POA: Diagnosis not present

## 2015-08-02 DIAGNOSIS — I10 Essential (primary) hypertension: Secondary | ICD-10-CM | POA: Diagnosis not present

## 2015-08-02 DIAGNOSIS — E785 Hyperlipidemia, unspecified: Secondary | ICD-10-CM | POA: Diagnosis present

## 2015-08-02 DIAGNOSIS — I5043 Acute on chronic combined systolic (congestive) and diastolic (congestive) heart failure: Secondary | ICD-10-CM | POA: Diagnosis present

## 2015-08-02 DIAGNOSIS — Z79899 Other long term (current) drug therapy: Secondary | ICD-10-CM | POA: Diagnosis not present

## 2015-08-02 DIAGNOSIS — G4733 Obstructive sleep apnea (adult) (pediatric): Secondary | ICD-10-CM | POA: Diagnosis present

## 2015-08-02 DIAGNOSIS — I340 Nonrheumatic mitral (valve) insufficiency: Secondary | ICD-10-CM

## 2015-08-02 DIAGNOSIS — I110 Hypertensive heart disease with heart failure: Principal | ICD-10-CM | POA: Diagnosis present

## 2015-08-02 DIAGNOSIS — I4819 Other persistent atrial fibrillation: Secondary | ICD-10-CM | POA: Insufficient documentation

## 2015-08-02 DIAGNOSIS — I5022 Chronic systolic (congestive) heart failure: Secondary | ICD-10-CM | POA: Diagnosis not present

## 2015-08-02 DIAGNOSIS — I4891 Unspecified atrial fibrillation: Secondary | ICD-10-CM | POA: Diagnosis not present

## 2015-08-02 DIAGNOSIS — I119 Hypertensive heart disease without heart failure: Secondary | ICD-10-CM | POA: Diagnosis present

## 2015-08-02 DIAGNOSIS — I2510 Atherosclerotic heart disease of native coronary artery without angina pectoris: Secondary | ICD-10-CM | POA: Diagnosis present

## 2015-08-02 DIAGNOSIS — R0600 Dyspnea, unspecified: Secondary | ICD-10-CM | POA: Diagnosis not present

## 2015-08-02 HISTORY — DX: Calculus of kidney: N20.0

## 2015-08-02 HISTORY — DX: Hyperlipidemia, unspecified: E78.5

## 2015-08-02 HISTORY — DX: Hypertensive heart disease without heart failure: I11.9

## 2015-08-02 HISTORY — PX: CARDIOVERSION: SHX1299

## 2015-08-02 HISTORY — DX: Other persistent atrial fibrillation: I48.19

## 2015-08-02 LAB — BASIC METABOLIC PANEL
ANION GAP: 7 (ref 5–15)
BUN: 18 mg/dL (ref 6–20)
CHLORIDE: 108 mmol/L (ref 101–111)
CO2: 27 mmol/L (ref 22–32)
Calcium: 9 mg/dL (ref 8.9–10.3)
Creatinine, Ser: 1.16 mg/dL (ref 0.61–1.24)
GFR calc non Af Amer: >60 mL/min (ref >60)
GLUCOSE: 120 mg/dL — AB (ref 65–99)
Potassium: 5 mmol/L (ref 3.5–5.1)
Sodium: 142 mmol/L (ref 135–145)

## 2015-08-02 LAB — MAGNESIUM: Magnesium: 2.1 mg/dL (ref 1.7–2.4)

## 2015-08-02 LAB — POCT I-STAT 4, (NA,K, GLUC, HGB,HCT)
GLUCOSE: 128 mg/dL — AB (ref 65–99)
HCT: 43 % (ref 39.0–52.0)
Hemoglobin: 14.6 g/dL (ref 13.0–17.0)
Potassium: 4.2 mmol/L (ref 3.5–5.1)
Sodium: 140 mmol/L (ref 135–145)

## 2015-08-02 SURGERY — CARDIOVERSION
Anesthesia: General

## 2015-08-02 MED ORDER — LIDOCAINE HCL (CARDIAC) 20 MG/ML IV SOLN
INTRAVENOUS | Status: DC | PRN
  Administered 2015-08-02: 20 mg via INTRAVENOUS
  Administered 2015-08-02: 10 mg via INTRAVENOUS
  Administered 2015-08-02: 40 mg via INTRAVENOUS
  Administered 2015-08-02: 50 mg via INTRAVENOUS

## 2015-08-02 MED ORDER — SODIUM CHLORIDE 0.9 % IV SOLN: 250.0000 mL | INTRAVENOUS | Status: DC | PRN

## 2015-08-02 MED ORDER — ISOSORBIDE MONONITRATE ER 30 MG PO TB24
30.0000 mg | ORAL_TABLET | Freq: Every day | ORAL | Status: DC
  Administered 2015-08-03 – 2015-08-04 (×2): 30 mg via ORAL
  Filled 2015-08-02 (×2): qty 1

## 2015-08-02 MED ORDER — APIXABAN 5 MG PO TABS
5.0000 mg | ORAL_TABLET | Freq: Two times a day (BID) | ORAL | Status: DC
  Administered 2015-08-02 – 2015-08-04 (×4): 5 mg via ORAL
  Filled 2015-08-02 (×4): qty 1

## 2015-08-02 MED ORDER — METOPROLOL SUCCINATE ER 50 MG PO TB24
50.0000 mg | ORAL_TABLET | Freq: Two times a day (BID) | ORAL | Status: DC
  Administered 2015-08-02 – 2015-08-04 (×4): 50 mg via ORAL
  Filled 2015-08-02 (×4): qty 1

## 2015-08-02 MED ORDER — SODIUM CHLORIDE 0.9 % IV SOLN
INTRAVENOUS | Status: DC
  Administered 2015-08-02: 13:00:00 via INTRAVENOUS

## 2015-08-02 MED ORDER — SODIUM CHLORIDE 0.9 % IJ SOLN
3.0000 mL | Freq: Two times a day (BID) | INTRAMUSCULAR | Status: DC
  Administered 2015-08-02 – 2015-08-04 (×4): 3 mL via INTRAVENOUS

## 2015-08-02 MED ORDER — ROSUVASTATIN CALCIUM 10 MG PO TABS
40.0000 mg | ORAL_TABLET | Freq: Every evening | ORAL | Status: DC
  Administered 2015-08-02 – 2015-08-03 (×2): 40 mg via ORAL
  Filled 2015-08-02 (×2): qty 4

## 2015-08-02 MED ORDER — SODIUM CHLORIDE 0.9 % IJ SOLN: 3.0000 mL | INTRAMUSCULAR | Status: DC | PRN

## 2015-08-02 MED ORDER — HYDROCORTISONE 1 % EX CREA: 1.0000 "application " | TOPICAL_CREAM | Freq: Three times a day (TID) | CUTANEOUS | Status: DC | PRN

## 2015-08-02 MED ORDER — FUROSEMIDE 40 MG PO TABS: 40.0000 mg | ORAL_TABLET | Freq: Every day | ORAL | Status: DC | PRN

## 2015-08-02 NOTE — Interval H&P Note (Signed)
History and Physical Interval Note:  08/02/2015 2:13 PM  Stanley Wells  has presented today for surgery, with the diagnosis of afib  The various methods of treatment have been discussed with the patient and family. After consideration of risks, benefits and other options for treatment, the patient has consented to  Procedure(s): CARDIOVERSION (N/A) as a surgical intervention .  The patient's history has been reviewed, patient examined, no change in status, stable for surgery.  I have reviewed the patient's chart and labs.  Questions were answered to the patient's satisfaction.     Charlton HawsPeter Nishan

## 2015-08-02 NOTE — Anesthesia Postprocedure Evaluation (Signed)
Anesthesia Post Note  Patient: Stanley Wells  Procedure(s) Performed: Procedure(s) (LRB): CARDIOVERSION (N/A)  Patient location during evaluation: PACU Anesthesia Type: MAC Level of consciousness: awake and alert Pain management: pain level controlled Vital Signs Assessment: post-procedure vital signs reviewed and stable Respiratory status: spontaneous breathing, nonlabored ventilation, respiratory function stable and patient connected to nasal cannula oxygen Cardiovascular status: blood pressure returned to baseline and stable Postop Assessment: no signs of nausea or vomiting Anesthetic complications: no    Last Vitals:  Filed Vitals:   08/02/15 1445 08/02/15 1450  BP: 147/109 132/109  Pulse: 97 95  Temp:    Resp: 23 25    Last Pain: There were no vitals filed for this visit.               Orlando Thalmann JENNETTE

## 2015-08-02 NOTE — H&P (View-Only) (Signed)
Cardiology Office Note   Date:  07/23/2015   ID:  Stanley FreibergJames C Wells, DOB 12/12/1945, MRN [id number]  PCP:  Noni SaupeEDDING II,[name], MD  Cardiologist:  Dr Lauraine RinneHarding  Cinque Begley, PA-C   No chief complaint on file.   History of Present Illness: Stanley Wells is a 69 y.o. male with a history of OSA, HTN, chronic systolic heart failure, mod-severe MR, PAF on Eliquis, CAD s/p CABG x5 2003. Admitted 12/09-12/11 with acute on chronic CHF. In afib, possible DCCV planned in 4 weeks.   Stanley Wells presents for  Follow-up of his atrial fibrillation.   Since discharge from the hospital, he has not missed a dose of Eliquis. According to his wife,  He will have been on it for 3 weeks this Thursday.   He struggles with the atrial fibrillation. He has significant fatigue and dyspnea on exertion. He does very little. He does not exercise in this been sitting mostly in the recliner. He has not had chest pain. He just doesn't feel like doing anything. He has not had edema, orthopnea or PND. He does not have palpitations. He just feels bad.   He is looking forward to getting cardioverted because he believes he will feel much better when he is back in sinus rhythm.   Past Medical History  Diagnosis Date  . CAD (coronary artery disease)     a. s/p CABG 2003  . Paroxysmal atrial flutter (HCC)     a. s/p succesful TEE/DCCV on 06/01/14.  b. on Eliquis  . Chronic systolic CHF (congestive heart failure) (HCC)     a. 2D ECHO: 05/17/2014: EF 30-35%, mild concentric hypertrophy, diffuse hypokinesis, akinesis of the basalinferior myocardium, severe hypokinesis of the mid-apicalinferior myocardium. Aortic sclerosis, mild MR  . Cholecystitis   . HTN (hypertension)     Past Surgical History  Procedure Laterality Date  . Coronary artery bypass graft  2003    x 5  . Tee without cardioversion N/A 06/01/2014    Procedure: TRANSESOPHAGEAL ECHOCARDIOGRAM (TEE);  Surgeon: Lars MassonKatarina H Nelson, MD;   [facility];  Service: Cardiovascular;  Laterality: N/A;  . Cardioversion N/A 06/01/2014    Procedure: CARDIOVERSION;  Surgeon: Lars MassonKatarina H Nelson, MD;  [facility];  Service: Cardiovascular;  Laterality: N/A;  . Cardiac catheterization N/A 06/19/2014    Procedure: RIGHT/LEFT HEART CATH AND CORONARY/GRAFT ANGIOGRAPHY;  Surgeon: Lennette Biharihomas A Kelly, MD;  [facility];  Service: Cardiovascular;  Laterality: N/A;    Current Outpatient Prescriptions  Medication Sig Dispense Refill  . apixaban (ELIQUIS) 5 MG TABS tablet Take 1 tablet (5 mg total) by mouth 2 (two) times daily. 60 tablet 11  . furosemide (LASIX) 40 MG tablet Take 1 tablet (40 mg total) by mouth daily as needed (fluid weight gain of 3 pounds or greater). 30 tablet 1  . isosorbide mononitrate (IMDUR) 30 MG 24 hr tablet Take 1 tablet (30 mg total) by mouth daily. 30 tablet 6  . metoprolol succinate (TOPROL-XL) 50 MG 24 hr tablet Take 1 tablet (50 mg total) by mouth 2 (two) times daily. Take with or immediately following a meal. 60 tablet 11  . Multiple Vitamin (MULTIVITAMIN WITH MINERALS) TABS tablet Take 1 tablet by mouth daily.    . rosuvastatin (CRESTOR) 40 MG tablet Take 1 tablet (40 mg total) by mouth every evening. 30 tablet 3  . [DISCONTINUED] amLODipine (NORVASC) 5 MG tablet Take 1 tablet (5 mg total) by mouth daily. 30 tablet 6  No current facility-administered medications for this visit.    Allergies:   Review of patient's allergies indicates no known allergies.    Social History:  The patient  reports that he quit smoking about 13 years ago. His smoking use included Cigarettes. He has a 40 pack-year smoking history. He has never used smokeless tobacco. He reports that he drinks alcohol. He reports that he does not use illicit drugs.   Family History:  The patient's family history includes Cancer in his father and mother. There is no history of Hypertension or Stroke.    ROS:  Please see the  history of present illness. All other systems are reviewed and negative.    PHYSICAL EXAM: VS:  BP 150/90 mmHg  Pulse 94  Ht  (1.803 m)  Wt 213 lb (96.616 kg)  BMI 29.72 kg/m2 , BMI Body mass index is 29.72 kg/(m^2). GEN: Well nourished, well developed, male in no acute distress HEENT: normal for age  Neck: no JVD, no carotid bruit, no masses Cardiac: irregular rate and rhythm; soft murmur, no rubs, or gallops Respiratory:  clear to auscultation bilaterally, normal work of breathing GI: soft, nontender, nondistended, + BS MS: no deformity or atrophy; no edema; distal pulses are 2+ in all 4 extremities  Skin: warm and dry, no rash Neuro:  Strength and sensation are intact Psych: euthymic mood, full affect   EKG:  EKG is ordered today. The ekg ordered today demonstrates atrial fibrillation, heart rate 94   Recent Labs: 10/03/2014: TSH 1.124 05/01/2015: ALT 21 07/05/2015: Magnesium 2.1 07/13/2015: B Natriuretic Peptide 1130.2*; Hemoglobin 13.6; Platelets 262 07/15/2015: BUN 24*; Creatinine, Ser 1.26*; Potassium 3.8; Sodium 139    Lipid Panel    Component Value Date/Time   CHOL 213* 05/01/2015 0942   TRIG 245* 05/01/2015 0942   HDL 38* 05/01/2015 0942   CHOLHDL 5.6* 05/01/2015 0942   VLDL 49* 05/01/2015 0942   LDLCALC 126 05/01/2015 0942     Wt Readings from Last 3 Encounters:  07/23/15 213 lb (96.616 kg)  07/15/15 205 lb 11.2 oz (93.305 kg)  07/07/15 212 lb 11.2 oz (96.48 kg)     Other studies Reviewed: Additional studies/ records that were reviewed today include:  Hospital records and office notes.  ASSESSMENT AND PLAN:  1.   Atrial fibrillation: the patient is tolerating poorly. He has been compliant with his doses of Eliquis. His wife tracks's carefully and he is been on it for almost 3 weeks. Therefore, he will be able to be cardioverted next week. We will set up a cardioversion for the end of next week. He will have been there few date: Eliquis for 4 weeks  and TEE will not be needed. Dr. Herbie Baltimore will be able to perform the procedure. His heart rate is controlled and no medication changes will be made at this time.   2. Chronic anticoagulation : Eliquis. This patients CHA2DS2-VASc Score and unadjusted Ischemic Stroke Rate (% per year) is equal to 4.8 % stroke rate/year from a score of 4 Above score calculated as 1 point each if present [CHF, HTN, DM, Vascular=MI/PAD/Aortic Plaque, Age if 65-74, or Male], 2 points each if present [Age > 75, or Stroke/TIA/TE]   3. Ischemic cardiomyopathy: his weight is up at his volume status is stable. He is to continue current therapy and contact us for any changes in his respiratory status  Current medicines are reviewed at length with the patient today.  The patient does not have concerns regarding medicines.  The following changes have been made:  no change  Labs/ tests ordered today include:  No orders of the defined types were placed in this encounter.     Disposition:   FU with  Dr. Herbie Baltimore  Signed, Leanna Battles  07/23/2015 2:35 PM    [facility] [address] [place], [place], [place]  [address] Phone: [phone]; Fax: [phone]

## 2015-08-02 NOTE — Transfer of Care (Signed)
Immediate Anesthesia Transfer of Care Note  Patient: Stanley Wells  Procedure(s) Performed: Procedure(s): CARDIOVERSION (N/A)  Patient Location: PACU and Endoscopy Unit  Anesthesia Type:General  Level of Consciousness: sedated  Airway & Oxygen Therapy: Patient Spontanous Breathing and Patient connected to nasal cannula oxygen  Post-op Assessment: Report given to RN and Post -op Vital signs reviewed and stable  Post vital signs: Reviewed and stable  Last Vitals:  Filed Vitals:   08/02/15 1422 08/02/15 1423  BP: 169/101   Pulse: 50 104  Temp:    Resp: 33 24    Complications: No apparent anesthesia complications

## 2015-08-02 NOTE — Consult Note (Signed)
ELECTROPHYSIOLOGY CONSULT NOTE    Patient ID: Stanley Wells Hoffer MRN: [id number], DOB/AGE: 68/04/1946 68 y.o.  Admit date: 08/02/2015 Date of Consult: 08/02/2015  Primary Physician: Noni SaupeEDDING II,[name], MD Primary Cardiologist: Herbie BaltimoreHarding Referring Physician: Eden EmmsNishan  Reason for Consultation: atrial fibrillation  HPI:  Stanley Wells Amon is a 69 y.o. male with a past medical history significant for CAD s/p CABG, persistent atrial fibrillation, hypertension, ischemic cardiomyopathy, chronic systolic heart failure, and OSA.  He reports first being diagnosed with atrial fibrillation post CABG in 2003.  He did well without significant arrhythmias until about 18 months ago when he developed persistent atrial fibrillation and underwent TEE guided cardioversion in 05/2014.  He then had recurrent AF in November of this year and underwent repeat cardioversion but only held SR for approximately 2 weeks.  He is symptomatic with his AF with fatigue, increased shortness of breath and presented today for repeat elective cardioversion. Cardioversion was attempted 4 times but was unsuccessful.  EP has been asked to evaluate for treatment options.  TEE 05/2014 demonstrated EF 30-35%, diffuse hypokinesis, moderate to severe MR with systolic reversal of flow in pulmonary veins, mild to moderate TR.  TTE 05/2014 demonstrated LV dysfunction and LA of 50. Repeat echo pending this admission.   Lab work is pending.   He currently reports increased fatigue and shortness of breath with exertion. He has not had dizziness, pre-syncope or syncope.  He has not had recent fevers, chills, nausea or vomiting. No chest pain. He reports compliance with Eliquis without missing any doses in the last month.   Past Medical History  Diagnosis Date  . CAD (coronary artery disease)     a. s/p CABG 2003  . Persistent atrial fibrillation (HCC)     a. s/p succesful TEE/DCCV on 06/01/14.  b. on Eliquis  . Chronic systolic CHF (congestive  heart failure) (HCC)     a. 2D ECHO: 05/17/2014: EF 30-35%, mild concentric hypertrophy, diffuse hypokinesis, akinesis of the basalinferior myocardium, severe hypokinesis of the mid-apicalinferior myocardium. Aortic sclerosis, mild MR  . Cholecystitis   . HTN (hypertension)      Surgical History:  Past Surgical History  Procedure Laterality Date  . Coronary artery bypass graft  2003    x 5  . Tee without cardioversion N/A 06/01/2014    Procedure: TRANSESOPHAGEAL ECHOCARDIOGRAM (TEE);  Surgeon: Lars MassonKatarina H Nelson, MD;  [facility];  Service: Cardiovascular;  Laterality: N/A;  . Cardioversion N/A 06/01/2014    Procedure: CARDIOVERSION;  Surgeon: Lars MassonKatarina H Nelson, MD;  [facility];  Service: Cardiovascular;  Laterality: N/A;  . Cardiac catheterization N/A 06/19/2014    Procedure: RIGHT/LEFT HEART CATH AND CORONARY/GRAFT ANGIOGRAPHY;  Surgeon: Lennette Biharihomas A Kelly, MD;  [facility];  Service: Cardiovascular;  Laterality: N/A;     Prescriptions prior to admission  Medication Sig Dispense Refill Last Dose  . apixaban (ELIQUIS) 5 MG TABS tablet Take 1 tablet (5 mg total) by mouth 2 (two) times daily. 60 tablet 11 08/02/2015 at Unknown time  . furosemide (LASIX) 40 MG tablet Take 1 tablet (40 mg total) by mouth daily as needed (fluid weight gain of 3 pounds or greater). 30 tablet 1 Past Month at Unknown time  . isosorbide mononitrate (IMDUR) 30 MG 24 hr tablet Take 1 tablet (30 mg total) by mouth daily. 30 tablet 6 08/02/2015 at Unknown time  . metoprolol succinate (TOPROL-XL) 50 MG 24 hr tablet Take 1 tablet (50 mg total) by mouth 2 (two) times  daily. Take with or immediately following a meal. 60 tablet 11 08/02/2015 at Unknown time  . Multiple Vitamin (MULTIVITAMIN WITH MINERALS) TABS tablet Take 1 tablet by mouth daily.   08/02/2015 at Unknown time  . rosuvastatin (CRESTOR) 40 MG tablet Take 1 tablet (40 mg total) by mouth every evening. 30 tablet 3 08/01/2015 at  Unknown time    Inpatient Medications:   Allergies: No Known Allergies  Social History   Social History  . Marital Status: Married    Spouse Name: N/A  . Number of Children: N/A  . Years of Education: N/A   Occupational History  . Retired    Social History Main Topics  . Smoking status: Former Smoker -- 1.00 packs/day for 40 years    Types: Cigarettes    Quit date: 02/02/2002  . Smokeless tobacco: Never Used  . Alcohol Use: Yes     Comment: 6 pack of beer a week  . Drug Use: No  . Sexual Activity: Not on file   Other Topics Concern  . Not on file   Social History Narrative   Lives in [place] with wife.     Family History  Problem Relation Age of Onset  . Cancer Mother   . Cancer Father   . Hypertension Neg Hx   . Stroke Neg Hx      Review of Systems: All other systems reviewed and are otherwise negative except as noted above.  Physical Exam: Filed Vitals:   08/02/15 1440 08/02/15 1445 08/02/15 1450 08/02/15 1455  BP: 174/114 147/109 132/109 139/107  Pulse: 95 97 95 97  Temp:      TempSrc:      Resp: SpO2: 97% 95% 95% 95%    GEN- The patient is well appearing, alert and oriented x 3 today.   HEENT: normocephalic, atraumatic; sclera clear, conjunctiva pink; hearing intact; oropharynx clear; neck supple  Lungs- Clear to ausculation bilaterally, normal work of breathing.  No wheezes, rales, rhonchi Heart- Irregular rate and rhythm, 2/6 SEM GI- soft, non-tender, non-distended, bowel sounds present  Extremities- no clubbing, cyanosis, or edema; DP/PT/radial pulses 2+ bilaterally MS- no significant deformity or atrophy Skin- warm and dry, no rash or lesion Psych- euthymic mood, full affect Neuro- strength and sensation are intact  Labs:   Lab Results  Component Value Date   WBC 16.2* 07/13/2015   HGB 14.6 08/02/2015   HCT 43.0 08/02/2015   MCV 92.4 07/13/2015   PLT 262 07/13/2015     Recent Labs Lab 08/02/15 1252  NA 140  K 4.2   GLUCOSE 128*      Radiology/Studies: Dg Chest 2 View 07/13/2015  CLINICAL DATA:  Shortness of breath for 2 weeks. History of atrial fibrillation. EXAM: CHEST  2 VIEW COMPARISON:  Two radiographs 07/05/2015 and 06/12/2005) FINDINGS: There is stable cardiac enlargement status post CABG. There is increased interstitial prominence consistent with mild edema. There are small bilateral pleural effusions. No confluent airspace opacity or pneumothorax. The bones appear unchanged. IMPRESSION: Interstitial pulmonary edema with small bilateral pleural effusions consistent with congestive heart failure. Electronically Signed   By: Carey Bullocks M.D.   On: 07/13/2015 09:31   EXB:MWUXLK fibrillation, rate 94  TELEMETRY: atrial fibrillation, ventricular rates 90's  Assessment/Plan: 1.  Persistent atrial fibrillation The patient has persistent atrial fibrillation and has failed cardioversion of AAD therapy.  With severely enlarged LA and moderate to severe MR by TEE 05/2014, I think our ability to maintain SR  is low.  The patient is very symptomatic with his atrial fibrillation and would like to try AAD therapy to restore SR.  With CAD and LV dysfunction, drug options are limited. Would like to avoid amiodarone with relatively young age. Will try Tikosyn for now. Risks, benefits reviewed with patient and his family who would like to proceed.  Keep K>3.9, Mg >1.8 QTc acceptable on SR EKG's, will need to follow closely Continue Eliquis for CHADS2VASC of at least 4 Will hold off on starting Tikosyn tonight until MR evaluated with echo. If MR not felt to be surgical, will plan to start Tikosyn tomorrow.  2.  Moderate to severe MR by TEE 05/2014 Will update echo If this is considered to be primary MR, may be best considered for MVR with concomitant MAZE procedure.  Discussed with Dr Herbie Baltimore - will do stat echo today to relook at MR.  If severe by TTE, will need repeat TEE tomorrow.  NPO after midnight tonight and  evaluation with Dr [name] who did patient's bypass surgery in 2003  3.  OSA The patient has been diagnosed with OSA but has been unwilling to try CPAP.  I have discussed with him and his family today the correlation between sleep apnea and atrial fibrillation. I have encouraged him to consider a trial of CPAP with Dr Tresa Endo. The patient will think about it.   3.  ICM/CAD No recent ischemic symptoms  4.  Chronic systolic heart failure EF 30-35% by echo 05/2014 Update echo Continue BB/diuretic Consider adding ARB for LV dysfunction and to help with AF if BMET stable  5.  HTN Above goal Add ARB if BMET stable  Dr Graciela Husbands to see later today  Signed, Gypsy Balsam, NP 08/02/2015 3:25 PM   Pt seen and examined and addended as necessary  Pt with progressive LV dysfn and severe- mod MR  The issue oaf AFib is appropriate to address but as outlined secondarily to figuring out what we do about his MR Echo today EF further deterioeration 25% with Mod Sev MR   For TEE in am  Tikosyn can be initated thereafter if elected not to pursue intervntion for his MR at which poitn a Maze would be worth considering   Input from CT surgery will be most welcome and helpful

## 2015-08-02 NOTE — Progress Notes (Signed)
Echocardiogram 2D Echocardiogram has been performed.  Dorothey BasemanReel, Obediah M 08/02/2015, 5:05 PM

## 2015-08-02 NOTE — Anesthesia Procedure Notes (Signed)
Procedure Name: MAC Date/Time: 08/02/2015 1:36 PM Performed by: [name], Stanley Wells SALOMAN Pre-anesthesia Checklist: Patient identified, Emergency Drugs available, Suction available, Patient being monitored and Timeout performed Patient Re-evaluated:Patient Re-evaluated prior to inductionOxygen Delivery Method: Ambu bag Preoxygenation: Pre-oxygenation with 100% oxygen Number of attempts: 1 Dental Injury: Teeth and Oropharynx as per pre-operative assessment

## 2015-08-02 NOTE — Anesthesia Preprocedure Evaluation (Addendum)
Anesthesia Evaluation  Patient identified by MRN, date of birth, ID band Patient awake    Reviewed: Allergy & Precautions, H&P , NPO status , Patient's Chart, lab work & pertinent test results, reviewed documented beta blocker date and time   History of Anesthesia Complications Negative for: history of anesthetic complications  Airway Mallampati: II  TM Distance: >3 FB Neck ROM: Full    Dental  (+) Teeth Intact, Dental Advisory Given   Pulmonary sleep apnea , former smoker,    breath sounds clear to auscultation       Cardiovascular Exercise Tolerance: Good hypertension, Pt. on medications and Pt. on home beta blockers + CAD, + Past MI, + CABG and +CHF  + dysrhythmias (eliquis) Atrial Fibrillation  Rhythm:Irregular Rate:Normal  2D ECHO: 05/17/2014: EF 30-35%, mild concentric hypertrophy, diffuse hypokinesis, akinesis of the basalinferior myocardium, severe hypokinesis of the mid-apicalinferior myocardium. Aortic sclerosis, mild MR  s/p CABG 2003  s/p succesful TEE/DCCV on 06/01/14. b. on Eliquis   Neuro/Psych negative neurological ROS     GI/Hepatic negative GI ROS, Neg liver ROS,   Endo/Other    Renal/GU negative Renal ROS     Musculoskeletal   Abdominal (+) + obese,  Abdomen: soft. Bowel sounds: normal.  Peds  Hematology   Anesthesia Other Findings   Reproductive/Obstetrics                            BP Readings from Last 3 Encounters:  08/02/15 162/113  07/23/15 150/90  07/15/15 148/92   Lab Results  Component Value Date   WBC 16.2* 07/13/2015   HGB 13.6 07/13/2015   HCT 43.5 07/13/2015   MCV 92.4 07/13/2015   PLT 262 07/13/2015   Lab Results  Component Value Date   INR 1.32 06/13/2015   INR 1.14 06/15/2014   INR 1.12 05/16/2014     Chemistry      Component Value Date/Time   NA 139 07/15/2015 0300   K 3.8 07/15/2015 0300   CL 101 07/15/2015 0300   CO2 32  07/15/2015 0300   BUN 24* 07/15/2015 0300   CREATININE 1.26* 07/15/2015 0300   CREATININE 0.90 05/01/2015 0942      Component Value Date/Time   CALCIUM 8.8* 07/15/2015 0300   ALKPHOS 64 05/01/2015 0942   AST 20 05/01/2015 0942   ALT 21 05/01/2015 0942   BILITOT 0.8 05/01/2015 0942      Anesthesia Physical  Anesthesia Plan  ASA: III  Anesthesia Plan: General   Post-op Pain Management:    Induction: Intravenous  Airway Management Planned: Mask and Natural Airway  Additional Equipment:   Intra-op Plan:   Post-operative Plan:   Informed Consent: I have reviewed the patients History and Physical, chart, labs and discussed the procedure including the risks, benefits and alternatives for the proposed anesthesia with the patient or authorized representative who has indicated his/her understanding and acceptance.   Dental advisory given  Plan Discussed with: Anesthesiologist, Surgeon and CRNA  Anesthesia Plan Comments:         Anesthesia Quick Evaluation

## 2015-08-02 NOTE — CV Procedure (Signed)
DCC:  70 mg diprovan and 40 mg lidocain Dr Gentry RochJudd Anesthesia  DCC x 4 120 J and 3x at 200J.  Never converted from afib at rate 100-110.    No immediate neurologic sequelae  Have spoken with Dr Herbie BaltimoreHarding and EP  Will admit for [facility] Ascikosyn  Stanley Wells

## 2015-08-02 NOTE — H&P (Signed)
Physician History and Physical     Patient ID: Stanley [name]: [id number] DOB/AGE: Apr 26, 1946 69 y.o. Admit date: 08/02/2015  Primary Care Physician: [name]., MD Primary Cardiologist: Herbie Baltimore   Active Problems:   Atrial fibrillation (HCC)   A-fib [facility])   HPI:  69 y.o. with ischemic DCM.  EF 30-35%  Previous CABG.  Severe MR and LAE.  Been in hospital multiple times last 2 months with CHF releated to afib.  Last TEE/DCC October successful.  Has not been on antiarrhythmic RX On Rx Eliquis  Arranged by Dr Herbie Baltimore to come in today for [facility].  Shocked 4 times with AP pad postion 120 x1 and 200 J x3 with no sinus beats.  Given his symptomatic class 3 CHF elected to admit patient for EP consult and likely  Tikosyn loading.  No recent chest pain.  Palpitations fatigue malaise and dyspnea.  Mild LE edema    Review of systems complete and found to be negative unless listed above   Past Medical History  Diagnosis Date  . CAD (coronary artery disease)     a. s/p CABG 2003  . Paroxysmal atrial flutter (HCC)     a. s/p succesful TEE/DCCV on 06/01/14.  b. on Eliquis  . Chronic systolic CHF (congestive heart failure) (HCC)     a. 2D ECHO: 05/17/2014: EF 30-35%, mild concentric hypertrophy, diffuse hypokinesis, akinesis of the basalinferior myocardium, severe hypokinesis of the mid-apicalinferior myocardium. Aortic sclerosis, mild MR  . Cholecystitis   . HTN (hypertension)     Family History  Problem Relation Age of Onset  . Cancer Mother   . Cancer Father   . Hypertension Neg Hx   . Stroke Neg Hx     Social History   Social History  . Marital Status: Married    Spouse Name: N/A  . Number of Children: N/A  . Years of Education: N/A   Occupational History  . Retired    Social History Main Topics  . Smoking status: Former Smoker -- 1.00 packs/day for 40 years    Types: Cigarettes    Quit date: 02/02/2002  . Smokeless tobacco: Never Used  . Alcohol Use: Yes   Comment: 6 pack of beer a week  . Drug Use: No  . Sexual Activity: Not on file   Other Topics Concern  . Not on file   Social History Narrative   Lives in [place] with wife.    Past Surgical History  Procedure Laterality Date  . Coronary artery bypass graft  2003    x 5  . Tee without cardioversion N/A 06/01/2014    Procedure: TRANSESOPHAGEAL ECHOCARDIOGRAM (TEE);  Surgeon: Lars Masson, MD;  Location: [facility] ENDOSCOPY;  Service: Cardiovascular;  Laterality: N/A;  . Cardioversion N/A 06/01/2014    Procedure: CARDIOVERSION;  Surgeon: Lars Masson, MD;  Location: [facility] ENDOSCOPY;  Service: Cardiovascular;  Laterality: N/A;  . Cardiac catheterization N/A 06/19/2014    Procedure: RIGHT/LEFT HEART CATH AND CORONARY/GRAFT ANGIOGRAPHY;  Surgeon: Lennette Bihari, MD;  Location: [facility] CATH LAB;  Service: Cardiovascular;  Laterality: N/A;     Prescriptions prior to admission  Medication Sig Dispense Refill Last Dose  . apixaban (ELIQUIS) 5 MG TABS tablet Take 1 tablet (5 mg total) by mouth 2 (two) times daily. 60 tablet 11 08/02/2015 at Unknown time  . furosemide (LASIX) 40 MG tablet Take 1 tablet (40 mg total) by mouth daily as needed (fluid weight gain of 3 pounds or greater). 30  tablet 1 Past Month at Unknown time  . isosorbide mononitrate (IMDUR) 30 MG 24 hr tablet Take 1 tablet (30 mg total) by mouth daily. 30 tablet 6 08/02/2015 at Unknown time  . metoprolol succinate (TOPROL-XL) 50 MG 24 hr tablet Take 1 tablet (50 mg total) by mouth 2 (two) times daily. Take with or immediately following a meal. 60 tablet 11 08/02/2015 at Unknown time  . Multiple Vitamin (MULTIVITAMIN WITH MINERALS) TABS tablet Take 1 tablet by mouth daily.   08/02/2015 at Unknown time  . rosuvastatin (CRESTOR) 40 MG tablet Take 1 tablet (40 mg total) by mouth every evening. 30 tablet 3 08/01/2015 at Unknown time    Physical Exam: Blood pressure 139/105, pulse 94, temperature 97.5 F (36.4 C), temperature source Oral,  resp. rate 23, SpO2 98 %.    Affect appropriate Healthy:  appears stated age HEENT: normal Neck supple with no adenopathy JVP normal no bruits no thyromegaly Lungs clear with no wheezing and good diaphragmatic motion Heart:  S1/S2 MR previous sternotomy PMI enlarged  Abdomen: benighn, BS positve, no tenderness, no AAA no bruit.  No HSM or HJR Distal pulses intact with no bruits No edema Neuro non-focal Skin warm and dry No muscular weakness  No current facility-administered medications on file prior to encounter.   Current Outpatient Prescriptions on File Prior to Encounter  Medication Sig Dispense Refill  . apixaban (ELIQUIS) 5 MG TABS tablet Take 1 tablet (5 mg total) by mouth 2 (two) times daily. 60 tablet 11  . furosemide (LASIX) 40 MG tablet Take 1 tablet (40 mg total) by mouth daily as needed (fluid weight gain of 3 pounds or greater). 30 tablet 1  . isosorbide mononitrate (IMDUR) 30 MG 24 hr tablet Take 1 tablet (30 mg total) by mouth daily. 30 tablet 6  . metoprolol succinate (TOPROL-XL) 50 MG 24 hr tablet Take 1 tablet (50 mg total) by mouth 2 (two) times daily. Take with or immediately following a meal. 60 tablet 11  . Multiple Vitamin (MULTIVITAMIN WITH MINERALS) TABS tablet Take 1 tablet by mouth daily.    . rosuvastatin (CRESTOR) 40 MG tablet Take 1 tablet (40 mg total) by mouth every evening. 30 tablet 3  . [DISCONTINUED] amLODipine (NORVASC) 5 MG tablet Take 1 tablet (5 mg total) by mouth daily. 30 tablet 6    Labs:   Lab Results  Component Value Date   WBC 16.2* 07/13/2015   HGB 14.6 08/02/2015   HCT 43.0 08/02/2015   MCV 92.4 07/13/2015   PLT 262 07/13/2015    Recent Labs Lab 08/02/15 1252  NA 140  K 4.2  GLUCOSE 128*   Lab Results  Component Value Date   TROPONINI 0.03 07/06/2015   TROPONINI 0.03 07/05/2015   TROPONINI 0.04* 07/05/2015     Lab Results  Component Value Date   CHOL 213* 05/01/2015   CHOL 119 10/03/2014   CHOL 166 06/15/2014     Lab Results  Component Value Date   HDL 38* 05/01/2015   HDL 36* 10/03/2014   HDL 51 06/15/2014   Lab Results  Component Value Date   LDLCALC 126 05/01/2015   LDLCALC 60 10/03/2014   LDLCALC 81 06/15/2014   Lab Results  Component Value Date   TRIG 245* 05/01/2015   TRIG 113 10/03/2014   TRIG 169* 06/15/2014   Lab Results  Component Value Date   CHOLHDL 5.6* 05/01/2015   CHOLHDL 3.3 10/03/2014   CHOLHDL 3.3 06/15/2014   No results  found for: LDLDIRECT     Radiology: Dg Chest 2 View  07/13/2015  CLINICAL DATA:  Shortness of breath for 2 weeks. History of atrial fibrillation. EXAM: CHEST  2 VIEW COMPARISON:  Two radiographs 07/05/2015 and 06/12/2005) FINDINGS: There is stable cardiac enlargement status post CABG. There is increased interstitial prominence consistent with mild edema. There are small bilateral pleural effusions. No confluent airspace opacity or pneumothorax. The bones appear unchanged. IMPRESSION: Interstitial pulmonary edema with small bilateral pleural effusions consistent with congestive heart failure. Electronically Signed   By: Carey BullocksWilliam  Veazey M.D.   On: 07/13/2015 09:31   Dg Chest Portable 1 View  07/05/2015  CLINICAL DATA:  Dyspnea EXAM: PORTABLE CHEST 1 VIEW COMPARISON:  06/13/2015 FINDINGS: Cardiac enlargement with changes of CABG. Negative for heart failure. Lungs are clear without infiltrate or effusion or mass lesion. IMPRESSION: No active disease. Electronically Signed   By: Marlan Palauharles  Clark M.D.   On: 07/05/2015 08:02    EKG: afib nonspecific ST changes   ASSESSMENT AND PLAN:  PAF:  In setting of ischemic DCM EF 30-35% and previous CABG.  Admit for Tikosyn load and EP consult  Continue Eliquis  Check labs to include K/Mg.  Likely repeat DCC attempt on Sunday/Monday before d/c if he tolerates Tikosyn  CHF:  Continue home dose lasix try to restore NSR and AV synchrony  CAD/CABG:  No angina continue asa and beta blocker   Signed: Theron AristaPeter  Nishan12/29/2016, 2:44 PM

## 2015-08-03 ENCOUNTER — Inpatient Hospital Stay (HOSPITAL_COMMUNITY): Payer: Medicare Other

## 2015-08-03 ENCOUNTER — Encounter (HOSPITAL_COMMUNITY): Payer: Self-pay | Admitting: Cardiovascular Disease

## 2015-08-03 ENCOUNTER — Encounter (HOSPITAL_COMMUNITY)
Admission: RE | Disposition: A | Discharge status: Home / Self Care | Payer: Self-pay | Source: Ambulatory Visit | Attending: Internal Medicine

## 2015-08-03 DIAGNOSIS — I340 Nonrheumatic mitral (valve) insufficiency: Secondary | ICD-10-CM

## 2015-08-03 DIAGNOSIS — I481 Persistent atrial fibrillation: Secondary | ICD-10-CM

## 2015-08-03 DIAGNOSIS — I5022 Chronic systolic (congestive) heart failure: Secondary | ICD-10-CM

## 2015-08-03 HISTORY — PX: TEE WITHOUT CARDIOVERSION: SHX5443

## 2015-08-03 LAB — BASIC METABOLIC PANEL
Anion gap: 8 (ref 5–15)
BUN: 20 mg/dL (ref 6–20)
CO2: 26 mmol/L (ref 22–32)
Calcium: 9.1 mg/dL (ref 8.9–10.3)
Chloride: 108 mmol/L (ref 101–111)
Creatinine, Ser: 1.14 mg/dL (ref 0.61–1.24)
GFR calc Af Amer: >60 mL/min (ref >60)
GFR calc non Af Amer: >60 mL/min (ref >60)
Glucose, Bld: 106 mg/dL — ABNORMAL HIGH (ref 65–99)
Potassium: 4.7 mmol/L (ref 3.5–5.1)
Sodium: 142 mmol/L (ref 135–145)

## 2015-08-03 LAB — MAGNESIUM: Magnesium: 2.2 mg/dL (ref 1.7–2.4)

## 2015-08-03 SURGERY — ECHOCARDIOGRAM, TRANSESOPHAGEAL
Anesthesia: Moderate Sedation

## 2015-08-03 MED ORDER — DIPHENHYDRAMINE HCL 50 MG/ML IJ SOLN
INTRAMUSCULAR | Status: AC
  Filled 2015-08-03: qty 1

## 2015-08-03 MED ORDER — BUTAMBEN-TETRACAINE-BENZOCAINE 2-2-14 % EX AERO
INHALATION_SPRAY | CUTANEOUS | Status: DC | PRN
  Administered 2015-08-03: 2 via TOPICAL

## 2015-08-03 MED ORDER — POTASSIUM CHLORIDE CRYS ER 10 MEQ PO TBCR
10.0000 meq | EXTENDED_RELEASE_TABLET | Freq: Every day | ORAL | Status: DC
  Administered 2015-08-03 – 2015-08-04 (×2): 10 meq via ORAL
  Filled 2015-08-03 (×2): qty 1

## 2015-08-03 MED ORDER — MIDAZOLAM HCL 5 MG/ML IJ SOLN
INTRAMUSCULAR | Status: AC
Start: 2015-08-03 — End: 2015-08-03
  Filled 2015-08-03: qty 2

## 2015-08-03 MED ORDER — LISINOPRIL 10 MG PO TABS
10.0000 mg | ORAL_TABLET | Freq: Every day | ORAL | Status: DC
  Administered 2015-08-03 – 2015-08-04 (×2): 10 mg via ORAL
  Filled 2015-08-03 (×2): qty 1

## 2015-08-03 MED ORDER — FUROSEMIDE 40 MG PO TABS
40.0000 mg | ORAL_TABLET | Freq: Every day | ORAL | Status: DC
  Administered 2015-08-03 – 2015-08-04 (×2): 40 mg via ORAL
  Filled 2015-08-03 (×2): qty 1

## 2015-08-03 MED ORDER — FENTANYL CITRATE (PF) 100 MCG/2ML IJ SOLN
INTRAMUSCULAR | Status: AC
  Filled 2015-08-03: qty 2

## 2015-08-03 MED ORDER — MIDAZOLAM HCL 10 MG/2ML IJ SOLN
INTRAMUSCULAR | Status: DC | PRN
Start: 2015-08-03 — End: 2015-08-03
  Administered 2015-08-03: 2 mg via INTRAVENOUS

## 2015-08-03 MED ORDER — FENTANYL CITRATE (PF) 100 MCG/2ML IJ SOLN
INTRAMUSCULAR | Status: DC | PRN
  Administered 2015-08-03: 25 ug via INTRAVENOUS

## 2015-08-03 NOTE — H&P (View-Only) (Signed)
ELECTROPHYSIOLOGY CONSULT NOTE    Patient ID: Stanley [name]: [id number], DOB/AGE: 68/04/1946 68 y.o.  Admit date: 08/02/2015 Date of Consult: 08/02/2015  Primary Physician: Noni SaupeEDDING II,[name], MD Primary Cardiologist: Herbie BaltimoreHarding Referring Physician: Eden EmmsNishan  Reason for Consultation: atrial fibrillation  HPI:  Stanley Wells is a 69 y.o. male with a past medical history significant for CAD s/p CABG, persistent atrial fibrillation, hypertension, ischemic cardiomyopathy, chronic systolic heart failure, and OSA.  He reports first being diagnosed with atrial fibrillation post CABG in 2003.  He did well without significant arrhythmias until about 18 months ago when he developed persistent atrial fibrillation and underwent TEE guided cardioversion in 05/2014.  He then had recurrent AF in November of this year and underwent repeat cardioversion but only held SR for approximately 2 weeks.  He is symptomatic with his AF with fatigue, increased shortness of breath and presented today for repeat elective cardioversion. Cardioversion was attempted 4 times but was unsuccessful.  EP has been asked to evaluate for treatment options.  TEE 05/2014 demonstrated EF 30-35%, diffuse hypokinesis, moderate to severe MR with systolic reversal of flow in pulmonary veins, mild to moderate TR.  TTE 05/2014 demonstrated LV dysfunction and LA of 50. Repeat echo pending this admission.   Lab work is pending.   He currently reports increased fatigue and shortness of breath with exertion. He has not had dizziness, pre-syncope or syncope.  He has not had recent fevers, chills, nausea or vomiting. No chest pain. He reports compliance with Eliquis without missing any doses in the last month.   Past Medical History  Diagnosis Date  . CAD (coronary artery disease)     a. s/p CABG 2003  . Persistent atrial fibrillation (HCC)     a. s/p succesful TEE/DCCV on 06/01/14.  b. on Eliquis  . Chronic systolic CHF (congestive  heart failure) (HCC)     a. 2D ECHO: 05/17/2014: EF 30-35%, mild concentric hypertrophy, diffuse hypokinesis, akinesis of the basalinferior myocardium, severe hypokinesis of the mid-apicalinferior myocardium. Aortic sclerosis, mild MR  . Cholecystitis   . HTN (hypertension)      Surgical History:  Past Surgical History  Procedure Laterality Date  . Coronary artery bypass graft  2003    x 5  . Tee without cardioversion N/A 06/01/2014    Procedure: TRANSESOPHAGEAL ECHOCARDIOGRAM (TEE);  Surgeon: Lars MassonKatarina H Nelson, MD;  [facility];  Service: Cardiovascular;  Laterality: N/A;  . Cardioversion N/A 06/01/2014    Procedure: CARDIOVERSION;  Surgeon: Lars MassonKatarina H Nelson, MD;  [facility];  Service: Cardiovascular;  Laterality: N/A;  . Cardiac catheterization N/A 06/19/2014    Procedure: RIGHT/LEFT HEART CATH AND CORONARY/GRAFT ANGIOGRAPHY;  Surgeon: Lennette Biharihomas A Kelly, MD;  [facility];  Service: Cardiovascular;  Laterality: N/A;     Prescriptions prior to admission  Medication Sig Dispense Refill Last Dose  . apixaban (ELIQUIS) 5 MG TABS tablet Take 1 tablet (5 mg total) by mouth 2 (two) times daily. 60 tablet 11 08/02/2015 at Unknown time  . furosemide (LASIX) 40 MG tablet Take 1 tablet (40 mg total) by mouth daily as needed (fluid weight gain of 3 pounds or greater). 30 tablet 1 Past Month at Unknown time  . isosorbide mononitrate (IMDUR) 30 MG 24 hr tablet Take 1 tablet (30 mg total) by mouth daily. 30 tablet 6 08/02/2015 at Unknown time  . metoprolol succinate (TOPROL-XL) 50 MG 24 hr tablet Take 1 tablet (50 mg total) by mouth 2 (two) times  daily. Take with or immediately following a meal. 60 tablet 11 08/02/2015 at Unknown time  . Multiple Vitamin (MULTIVITAMIN WITH MINERALS) TABS tablet Take 1 tablet by mouth daily.   08/02/2015 at Unknown time  . rosuvastatin (CRESTOR) 40 MG tablet Take 1 tablet (40 mg total) by mouth every evening. 30 tablet 3 08/01/2015 at  Unknown time    Inpatient Medications:   Allergies: No Known Allergies  Social History   Social History  . Marital Status: Married    Spouse Name: N/A  . Number of Children: N/A  . Years of Education: N/A   Occupational History  . Retired    Social History Main Topics  . Smoking status: Former Smoker -- 1.00 packs/day for 40 years    Types: Cigarettes    Quit date: 02/02/2002  . Smokeless tobacco: Never Used  . Alcohol Use: Yes     Comment: 6 pack of beer a week  . Drug Use: No  . Sexual Activity: Not on file   Other Topics Concern  . Not on file   Social History Narrative   Lives in [place] with wife.     Family History  Problem Relation Age of Onset  . Cancer Mother   . Cancer Father   . Hypertension Neg Hx   . Stroke Neg Hx      Review of Systems: All other systems reviewed and are otherwise negative except as noted above.  Physical Exam: Filed Vitals:   08/02/15 1440 08/02/15 1445 08/02/15 1450 08/02/15 1455  BP: 174/114 147/109 132/109 139/107  Pulse: 95 97 95 97  Temp:      TempSrc:      Resp: 22 23 25 28  SpO2: 97% 95% 95% 95%    GEN- The patient is well appearing, alert and oriented x 3 today.   HEENT: normocephalic, atraumatic; sclera clear, conjunctiva pink; hearing intact; oropharynx clear; neck supple  Lungs- Clear to ausculation bilaterally, normal work of breathing.  No wheezes, rales, rhonchi Heart- Irregular rate and rhythm, 2/6 SEM GI- soft, non-tender, non-distended, bowel sounds present  Extremities- no clubbing, cyanosis, or edema; DP/PT/radial pulses 2+ bilaterally MS- no significant deformity or atrophy Skin- warm and dry, no rash or lesion Psych- euthymic mood, full affect Neuro- strength and sensation are intact  Labs:   Lab Results  Component Value Date   WBC 16.2* 07/13/2015   HGB 14.6 08/02/2015   HCT 43.0 08/02/2015   MCV 92.4 07/13/2015   PLT 262 07/13/2015     Recent Labs Lab 08/02/15 1252  NA 140  K 4.2   GLUCOSE 128*      Radiology/Studies: Dg Chest 2 View 07/13/2015  CLINICAL DATA:  Shortness of breath for 2 weeks. History of atrial fibrillation. EXAM: CHEST  2 VIEW COMPARISON:  Two radiographs 07/05/2015 and 06/12/2005) FINDINGS: There is stable cardiac enlargement status post CABG. There is increased interstitial prominence consistent with mild edema. There are small bilateral pleural effusions. No confluent airspace opacity or pneumothorax. The bones appear unchanged. IMPRESSION: Interstitial pulmonary edema with small bilateral pleural effusions consistent with congestive heart failure. Electronically Signed   By: William  Veazey M.D.   On: 07/13/2015 09:31   EKG:atrial fibrillation, rate 94  TELEMETRY: atrial fibrillation, ventricular rates 90's  Assessment/Plan: 1.  Persistent atrial fibrillation The patient has persistent atrial fibrillation and has failed cardioversion of AAD therapy.  With severely enlarged LA and moderate to severe MR by TEE 05/2014, I think our ability to maintain SR   is low.  The patient is very symptomatic with his atrial fibrillation and would like to try AAD therapy to restore SR.  With CAD and LV dysfunction, drug options are limited. Would like to avoid amiodarone with relatively young age. Will try Tikosyn for now. Risks, benefits reviewed with patient and his family who would like to proceed.  Keep K>3.9, Mg >1.8 QTc acceptable on SR EKG's, will need to follow closely Continue Eliquis for CHADS2VASC of at least 4 Will hold off on starting Tikosyn tonight until MR evaluated with echo. If MR not felt to be surgical, will plan to start Tikosyn tomorrow.  2.  Moderate to severe MR by TEE 05/2014 Will update echo If this is considered to be primary MR, may be best considered for MVR with concomitant MAZE procedure.  Discussed with Dr Herbie Baltimore - will do stat echo today to relook at MR.  If severe by TTE, will need repeat TEE tomorrow.  NPO after midnight tonight and  evaluation with Dr [name] who did patient's bypass surgery in 2003  3.  OSA The patient has been diagnosed with OSA but has been unwilling to try CPAP.  I have discussed with him and his family today the correlation between sleep apnea and atrial fibrillation. I have encouraged him to consider a trial of CPAP with Dr Tresa Endo. The patient will think about it.   3.  ICM/CAD No recent ischemic symptoms  4.  Chronic systolic heart failure EF 30-35% by echo 05/2014 Update echo Continue BB/diuretic Consider adding ARB for LV dysfunction and to help with AF if BMET stable  5.  HTN Above goal Add ARB if BMET stable  Dr Graciela Husbands to see later today  Signed, Gypsy Balsam, NP 08/02/2015 3:25 PM   Pt seen and examined and addended as necessary  Pt with progressive LV dysfn and severe- mod MR  The issue oaf AFib is appropriate to address but as outlined secondarily to figuring out what we do about his MR Echo today EF further deterioeration 25% with Mod Sev MR   For TEE in am  Tikosyn can be initated thereafter if elected not to pursue intervntion for his MR at which poitn a Maze would be worth considering   Input from CT surgery will be most welcome and helpful

## 2015-08-03 NOTE — Progress Notes (Signed)
  Echocardiogram Echocardiogram Transesophageal has been performed.  Stanley Wells, Stanley Wells 08/03/2015, 12:19 PM

## 2015-08-03 NOTE — Interval H&P Note (Signed)
History and Physical Interval Note:  08/03/2015 11:28 AM  Stanley Wells  has presented today for surgery, with the diagnosis of MR  The various methods of treatment have been discussed with the patient and family. After consideration of risks, benefits and other options for treatment, the patient has consented to  Procedure(s): TRANSESOPHAGEAL ECHOCARDIOGRAM (TEE) (N/A) as a surgical intervention .  The patient's history has been reviewed, patient examined, no change in status, stable for surgery.  I have reviewed the patient's chart and labs.  Questions were answered to the patient's satisfaction.     Lars MassonNELSON, Amarri Satterly H

## 2015-08-03 NOTE — Progress Notes (Signed)
PROGRESS NOTE  Subjective:   69 y.o. male with a history of OSA, HTN, chronic systolic heart failure, mod-severe MR, PAF on Eliquis, CAD s/p CABG x5 2003. Admitted 12/09-12/11 with acute on chronic CHF. In afib  I have reviewed the echo .  Has severe MR.  Significant LAE  TEE shows EF of 20%.  Severe MR with flow reversal in Pulmonary vein.     Objective:    Vital Signs:   Temp:  [97.6 F (36.4 C)-98.3 F (36.8 C)] 97.6 F (36.4 C) (12/30 1301) Pulse Rate:  [34-105] 103 (12/30 1301) Resp:  [0-34] 18 (12/30 1301) BP: (128-174)/(70-120) 149/106 mmHg (12/30 1301) SpO2:  [91 %-100 %] 97 % (12/30 1301) Weight:  [211 lb 9.6 oz (95.981 kg)-211 lb 12.8 oz (96.072 kg)] 211 lb 12.8 oz (96.072 kg) (12/30 0434)  Last BM Date: 08/03/15   24-hour weight change: Weight change:   Weight trends: Filed Weights   08/02/15 1541 08/03/15 0434  Weight: 211 lb 9.6 oz (95.981 kg) 211 lb 12.8 oz (96.072 kg)    Intake/Output:  12/29 0701 - 12/30 0700 In: 450 [I.V.:450] Out: 550 [Urine:550] Total I/O In: 0  Out: 300 [Urine:300]   Physical Exam: BP 149/106 mmHg  Pulse 103  Temp(Src) 97.6 F (36.4 C) (Oral)  Resp 18  Ht 5\' 11"  (1.803 m)  Wt 211 lb 12.8 oz (96.072 kg)  BMI 29.55 kg/m2  SpO2 97%  Wt Readings from Last 3 Encounters:  08/03/15 211 lb 12.8 oz (96.072 kg)  07/23/15 213 lb (96.616 kg)  07/15/15 205 lb 11.2 oz (93.305 kg)    General: Vital signs reviewed and noted.   Head: Normocephalic, atraumatic.  Eyes: conjunctivae/corneas clear.  EOM's intact.   Throat: normal  Neck:  normal   Lungs:    mostly clear, occasional rale   Heart:   Irreg. Irreg. Soft systolic murmur   Abdomen:  Soft, non-tender, non-distended    Extremities: No edema    Neurologic: A&O X3, CN II - XII are grossly intact.   Psych: Normal     Labs: BMET:  Recent Labs  08/02/15 1553 08/03/15 0324  NA 142 142  K 5.0 4.7  CL 108 108  CO2 27 26  GLUCOSE 120* 106*  BUN 18 20    CREATININE 1.16 1.14  CALCIUM 9.0 9.1  MG 2.1 2.2    Liver function tests: No results for input(s): AST, ALT, ALKPHOS, BILITOT, PROT, ALBUMIN in the last 72 hours. No results for input(s): LIPASE, AMYLASE in the last 72 hours.  CBC:  Recent Labs  08/02/15 1252  HGB 14.6  HCT 43.0    Cardiac Enzymes: No results for input(s): CKTOTAL, CKMB, TROPONINI in the last 72 hours.  Coagulation Studies: No results for input(s): LABPROT, INR in the last 72 hours.  Other: Invalid input(s): POCBNP No results for input(s): DDIMER in the last 72 hours. No results for input(s): HGBA1C in the last 72 hours. No results for input(s): CHOL, HDL, LDLCALC, TRIG, CHOLHDL in the last 72 hours. No results for input(s): TSH, T4TOTAL, T3FREE, THYROIDAB in the last 72 hours.  Invalid input(s): FREET3 No results for input(s): VITAMINB12, FOLATE, FERRITIN, TIBC, IRON, RETICCTPCT in the last 72 hours.   Other results: Tele :  Atrial fib   Medications:    Infusions:    Scheduled Medications: . apixaban  5 mg Oral BID  . isosorbide mononitrate  30 mg Oral Daily  . metoprolol succinate  50 mg Oral BID  . rosuvastatin  40 mg Oral QPM  . sodium chloride  3 mL Intravenous Q12H    Assessment/ Plan:   Active Problems:   Atrial fibrillation (HCC)   A-fib (HCC)   Persistent atrial fibrillation (HCC)  1. Chronic systolic CHF:   Needs to be back on lasix and lisinopril Will restart today .   Anticipate Dc tomorrow if he does well   2. Atrial fib:   Failed  cardioversion yesterday .  There was some thought about starting Tikosyn by I discussed the case with Gypsy Balsam, NP  today.  I think we should tune up his CHF symptoms ( add lasix and lisinopril back )  And consider antiarrhythmics at a later point .   3. Mitral regurg.:   Has severe MR. I've reviewed the echo. He should be sent back to Dr. [name] to discuss option .   Possible MVR ( ? mitra clip since his EF is so low) + MAZE.    Anticipate DC tomorrow if he does well   Disposition: Length of Stay: 1  Vesta Mixer, Montez Hageman., MD, [facility] 08/03/2015, 2:29 PM Office [phone] Pager [phone]

## 2015-08-03 NOTE — Plan of Care (Signed)
Problem: Education: Goal: Knowledge of disease or condition will improve Outcome: Completed/Met Date Met:  08/03/15 Pt is aware of the plan for for TEE . Also aware of the plan to start Tikosyn tomorrow.

## 2015-08-03 NOTE — CV Procedure (Signed)
     Transesophageal Echocardiogram Note  Stanley FreibergJames C Murry [id number] 11/03/1945  Procedure: Transesophageal Echocardiogram Indications: MR, a-fib  Procedure Details Consent: Obtained Time Out: Verified patient identification, verified procedure, site/side was marked, verified correct patient position, special equipment/implants available, Radiology Safety Procedures followed,  medications/allergies/relevent history reviewed, required imaging and test results available.  Performed  Medications: Fentanyl: 25 mcg Versed: 2 mg  Left ventricle: The cavity size was moderately dilated. There was mild concentric hypertrophy. Systolic function was severely reduced. The estimated ejection fraction was in the range of 20% to 25%. Diffuse hypokinesis. - Mitral valve: There was severe regurgitation directed centrally and covering > 40% of the left atrium.  Unable to calculated severity of the MR based on PISA as there are at least 2 jets, however there is clear systolic flow reversal in two pulmonary arteries.  - Left atrium: The atrium was severely dilated. - Right ventricle: Systolic function was moderately reduced. - Right atrium: The atrium was mildly dilated. - Tricuspid valve: There was moderate regurgitation. Left Atrium/ Left atrial appendage: Severely decreased velocities. No thrombus. Atrial septum: No ASD or PFO, lipomatous hypertrophy of the interatrial septum.  Aorta: Mild plaque.   Complications: No apparent complications. Patient did tolerate procedure well.  Lars MassonNELSON, Uriel Horkey H, MD, [facility] 08/03/2015, 11:29 AM

## 2015-08-03 NOTE — Interval H&P Note (Signed)
History and Physical Interval Note:  08/03/2015 8:56 AM  Stanley Wells  has presented today for surgery, with the diagnosis of MR  The various methods of treatment have been discussed with the patient and family. After consideration of risks, benefits and other options for treatment, the patient has consented to  Procedure(s): TRANSESOPHAGEAL ECHOCARDIOGRAM (TEE) (N/A) as a surgical intervention .  The patient's history has been reviewed, patient examined, no change in status, stable for surgery.  I have reviewed the patient's chart and labs.  Questions were answered to the patient's satisfaction.     Lars MassonNELSON, Zailyn Thoennes H

## 2015-08-03 NOTE — Progress Notes (Signed)
Severe MR noted on TEE - surgical consult recommended for possible MVR/MAZE.  Discussed with Drs Herbie BaltimoreHarding, Nahser, and Cornelius Moraswen.  For now, would leave in AF as starting AAD therapy and restoring SR would require continuous anticoagulation for 4 weeks.   Dr Cornelius Moraswen is out of the hospital until next Tuesday.  He will see patient in hospital (if still here) or as an outpatient if discharged.  Per Dr Cornelius Moraswen, right and left heart cath can be done either before or after surgical consult.  Gypsy BalsamAmber Seiler, NP 08/03/2015 6:10 PM

## 2015-08-04 ENCOUNTER — Encounter (HOSPITAL_COMMUNITY): Payer: Self-pay | Admitting: Nurse Practitioner

## 2015-08-04 DIAGNOSIS — I5023 Acute on chronic systolic (congestive) heart failure: Secondary | ICD-10-CM

## 2015-08-04 DIAGNOSIS — I340 Nonrheumatic mitral (valve) insufficiency: Secondary | ICD-10-CM

## 2015-08-04 DIAGNOSIS — I119 Hypertensive heart disease without heart failure: Secondary | ICD-10-CM | POA: Diagnosis present

## 2015-08-04 DIAGNOSIS — E785 Hyperlipidemia, unspecified: Secondary | ICD-10-CM | POA: Diagnosis present

## 2015-08-04 LAB — BASIC METABOLIC PANEL
Anion gap: 5 (ref 5–15)
BUN: 17 mg/dL (ref 6–20)
CO2: 28 mmol/L (ref 22–32)
Calcium: 8.6 mg/dL — ABNORMAL LOW (ref 8.9–10.3)
Chloride: 107 mmol/L (ref 101–111)
Creatinine, Ser: 1.15 mg/dL (ref 0.61–1.24)
GFR calc Af Amer: >60 mL/min (ref >60)
GFR calc non Af Amer: >60 mL/min (ref >60)
Glucose, Bld: 115 mg/dL — ABNORMAL HIGH (ref 65–99)
Potassium: 4 mmol/L (ref 3.5–5.1)
Sodium: 140 mmol/L (ref 135–145)

## 2015-08-04 LAB — MAGNESIUM: Magnesium: 2.2 mg/dL (ref 1.7–2.4)

## 2015-08-04 MED ORDER — LISINOPRIL 10 MG PO TABS: 10.0000 mg | ORAL_TABLET | Freq: Every day | ORAL | Status: DC

## 2015-08-04 MED ORDER — POTASSIUM CHLORIDE CRYS ER 10 MEQ PO TBCR: 10.0000 meq | EXTENDED_RELEASE_TABLET | Freq: Every day | ORAL | Status: DC

## 2015-08-04 MED ORDER — FUROSEMIDE 40 MG PO TABS: 40.0000 mg | ORAL_TABLET | Freq: Every day | ORAL | Status: DC

## 2015-08-04 NOTE — Discharge Summary (Signed)
Discharge Summary   Patient ID: Stanley Wells,  MRN: [id number], DOB/AGE: 68/04/1946 68 y.o.  Admit date: 08/02/2015 Discharge date: 08/04/2015  Primary Care Provider: Noni SaupeEDDING II,[name] Primary Cardiologist: [name]. Harding, MD   Discharge Diagnoses    Principal Problem:   Acute on chronic combined systolic and diastolic congestive heart failure, NYHA class 1 (HCC)  **Minus 1.75 liters this admission with reduction in weight from 211 lbs on admission to 209 lbs on discharge.  Active Problems:   Cardiomyopathy, ischemic: EF 20-25% by TEE this admission   Atrial fibrillation (HCC)  **Failed DCCV this admission.  **Chronic Eliquis.   Severe mitral regurgitation   Coronary artery disease involving native coronary artery without angina pectoris  **Prior CABG.   OSA (obstructive sleep apnea)   Chronic anticoagulation - Eliquis, CHADS2VASC=4   Hyperlipidemia   Hypertensive heart disease   Allergies No Known Allergies  Diagnostic Studies/Procedures    Unsuccessful Direct Current Cardioversion 12.29.2016  Shocked 4 times with AP pad postion 120 x1 and 200 J x3 with no sinus beats. _____________   2D Echocardiogram 12.29.2016  Study Conclusions  - Left ventricle: The cavity size was moderately dilated. There was   mild concentric hypertrophy. Systolic function was severely   reduced. The estimated ejection fraction was in the range of 20%   to 25%. Diffuse hypokinesis. - Mitral valve: There was moderate to severe regurgitation directed   centrally. - Left atrium: The atrium was severely dilated. - Right ventricle: Systolic function was moderately reduced. - Right atrium: The atrium was mildly dilated. - Tricuspid valve: There was moderate regurgitation. - Pulmonary arteries: Systolic pressure was mildly increased. PA   peak pressure: 33 mm Hg (S). - Line: A venous catheter was visualized in the superior vena cava,   with its tip in the right atrium. No abnormal  features noted. _____________   Transesophageal Echocardiogram 12.30.2016  Study Conclusions  - Left ventricle: The cavity size was moderately dilated. Systolic   function was severely reduced. The estimated ejection fraction   was in the range of 20% to 25%. Diffuse hypokinesis. - Aortic valve: No evidence of vegetation. - Mitral valve: There is severe mitral regurgitation with centrally   directed jet. The severity is based on systolic flow reversal in   two pulmonary veins and regurgitant jet covering > 50% of the   left atrium. PISA inacurate as there are at least two jets.   Mitral valve leaflets are mildly thickened and not calcified and   therefore favorable for repair. . There was severe regurgitation   directed centrally. - Left atrium: The atrium was severely dilated. No evidence of   thrombus in the atrial cavity or appendage. No evidence of   thrombus in the atrial cavity or appendage. No evidence of   thrombus in the appendage. - Right ventricle: Systolic function was moderately reduced. - Right atrium: The atrium was moderately dilated. No evidence of   thrombus in the atrial cavity or appendage. - Tricuspid valve: There was moderate regurgitation. - Pulmonic valve: No evidence of vegetation. _____________   History of Present Illness  69 year old male with a prior history of ischemic cardiac myopathy and systolic heart failure as well as coronary artery disease status post prior coronary artery bypass grafting, severe mitral regurgitation, and atrial fibrillation on chronic anticoagulation. Patient has been hospitalized multiple times in the past 2 months secondary to recurrent A. fib and congestive heart failure requiring diuresis. He was seen in clinic on December  19 with ongoing complaints of fatigue and dyspnea on exertion. Arrangements were made for outpatient cardioversion.  Hospital Course   Consultants: None   Patient presented to [facility] on December 29  and underwent elective cardioversion. Despite 4 attempts at cardioversion, he remained in atrial fibrillation. As result, decision was made to admit him for further management of heart failure and electrophysiology evaluation.  He was seen by [name] on December 29, and consideration was given to initiation of Tikosyn therapy. Was felt that he should have further evaluation of his mitral regurgitation prior to initiation however. A 2-D echocardiogram was undertaken revealing EF of 25% with diffuse hypokinesis and moderate to severe mitral regurgitation. TEE was felt to be necessary to better evaluate his mitral regurgitation and this was performed on December 30, also showing an EF of 20-25%, diffuse hypokinesis, and severe mitral regurgitation with a centrally directed jet. Systolic flow reversal in the 2 pulmonary veins was noted. Again, consideration was given to initiation of antiarrhythmic therapy, however it was felt that he would benefit from better management of heart failure symptoms as well as consideration for mitral valve surgery and maze procedure prior to Tikosyn initiation. ACE inhibitor and oral Lasix therapy were resumed. Thoracic surgery was consulted with recommendation for outpatient follow-up and surgical consultation.  [name] is felt to be stable for discharge today. He has early follow-up scheduled in our clinic on January 9 and will be contacted by thoracic surgery for follow-up with Dr. [name]. _____________  Discharge Vitals Blood pressure 154/100, pulse 90, temperature 98.3 F (36.8 C), temperature source Oral, resp. rate 18, height  (1.803 m), weight 209 lb 12.8 oz (95.165 kg), SpO2 98 %.  Filed Weights   08/02/15 1541 08/03/15 0434 08/04/15 0500  Weight: 211 lb 9.6 oz (95.981 kg) 211 lb 12.8 oz (96.072 kg) 209 lb 12.8 oz (95.165 kg)    Labs     CBC  Recent Labs  08/02/15 1252  HGB 14.6  HCT 43.0   Basic Metabolic Panel  Recent Labs  08/03/15 0324  08/04/15 0540  NA 142 140  K 4.7 4.0  CL 108 107  CO2 26 28  GLUCOSE 106* 115*  BUN 20 17  CREATININE 1.14 1.15  CALCIUM 9.1 8.6*  MG 2.2 2.2   Disposition   Pt is being discharged home today in good condition.  Follow-up Plans & Appointments        Follow-up Information    Follow up with Marykay Lex, MD On 08/13/2015.   Specialty:  Cardiology   Why:  9:30 AM   Contact information:   [address] [place] [address] [phone]        Discharge Medications     Medication List    TAKE these medications        apixaban 5 MG Tabs tablet  Commonly known as:  ELIQUIS  Take 1 tablet (5 mg total) by mouth 2 (two) times daily.     furosemide 40 MG tablet  Commonly known as:  LASIX  Take 1 tablet (40 mg total) by mouth daily.     isosorbide mononitrate 30 MG 24 hr tablet  Commonly known as:  IMDUR  Take 1 tablet (30 mg total) by mouth daily.     lisinopril 10 MG tablet  Commonly known as:  PRINIVIL,ZESTRIL  Take 1 tablet (10 mg total) by mouth daily.     metoprolol succinate 50 MG 24 hr tablet  Commonly  known as:  TOPROL-XL  Take 1 tablet (50 mg total) by mouth 2 (two) times daily. Take with or immediately following a meal.     multivitamin with minerals Tabs tablet  Take 1 tablet by mouth daily.     potassium chloride 10 MEQ tablet  Commonly known as:  K-DUR,KLOR-CON  Take 1 tablet (10 mEq total) by mouth daily.     rosuvastatin 40 MG tablet  Commonly known as:  CRESTOR  Take 1 tablet (40 mg total) by mouth every evening.         Outstanding Labs/Studies   F/U BMET @ office f/u on 1/9 (new ACEI; daily lasix).  Duration of Discharge Encounter   Greater than 30 minutes including physician time.  Signed, Nicolasa Ducking NP 08/04/2015, 10:16 AM       Hillis Range MD, [facility] 08/04/2015 12:26 PM

## 2015-08-04 NOTE — Progress Notes (Signed)
PROGRESS NOTE  Subjective:   69 y.o. male with a history of OSA, HTN, chronic systolic heart failure, mod-severe MR, PAF on Eliquis, CAD s/p CABG x5 2003. Admitted 12/09-12/11 with acute on chronic CHF. In afib  Has severe MR.  Significant LAE  TEE shows EF of 20%.  Severe MR with flow reversal in Pulmonary vein.  Feels well today.  Denies CP, SOB, or other concerns.  Wants to go home.   Objective:    Vital Signs:   Temp:  [97.6 F (36.4 C)-98.3 F (36.8 C)] 98.3 F (36.8 C) (12/31 0500) Pulse Rate:  [34-105] 90 (12/31 0803) Resp:  [0-34] 18 (12/31 0500) BP: (128-154)/(90-120) 154/100 mmHg (12/31 0802) SpO2:  [91 %-100 %] 98 % (12/31 0500) Weight:  [209 lb 12.8 oz (95.165 kg)] 209 lb 12.8 oz (95.165 kg) (12/31 0500)  Last BM Date: 08/03/15   24-hour weight change: Weight change: -1 lb 12.8 oz (-0.816 kg)  Weight trends: Filed Weights   08/02/15 1541 08/03/15 0434 08/04/15 0500  Weight: 211 lb 9.6 oz (95.981 kg) 211 lb 12.8 oz (96.072 kg) 209 lb 12.8 oz (95.165 kg)    Intake/Output:  12/30 0701 - 12/31 0700 In: 1015 [P.O.:1015] Out: 1200 [Urine:1200]     Physical Exam: BP 154/100 mmHg  Pulse 90  Temp(Src) 98.3 F (36.8 C) (Oral)  Resp 18  Ht  (1.803 m)  Wt 209 lb 12.8 oz (95.165 kg)  BMI 29.27 kg/m2  SpO2 98%  Wt Readings from Last 3 Encounters:  08/04/15 209 lb 12.8 oz (95.165 kg)  07/23/15 213 lb (96.616 kg)  07/15/15 205 lb 11.2 oz (93.305 kg)    General: Vital signs reviewed and noted.   Head: Normocephalic, atraumatic.  Eyes: conjunctivae/corneas clear.  EOM's intact.   Throat: normal  Neck:  normal   Lungs:    mostly clear, occasional rale   Heart:   Irreg. Irreg. Soft systolic murmur   Abdomen:  Soft, non-tender, non-distended    Extremities: No edema    Neurologic: A&O X3, CN II - XII are grossly intact.   Psych: Normal     Labs: BMET:  Recent Labs  08/03/15 0324 08/04/15 0540  NA 142 140  K 4.7 4.0  CL 108 107    CO2 26 28  GLUCOSE 106* 115*  BUN 20 17  CREATININE 1.14 1.15  CALCIUM 9.1 8.6*  MG 2.2 2.2    CBC:  Recent Labs  08/02/15 1252  HGB 14.6  HCT 43.0    Other results: Tele :  Atrial fib   Medications:    Infusions:    Scheduled Medications: . apixaban  5 mg Oral BID  . furosemide  40 mg Oral Daily  . isosorbide mononitrate  30 mg Oral Daily  . lisinopril  10 mg Oral Daily  . metoprolol succinate  50 mg Oral BID  . potassium chloride  10 mEq Oral Daily  . rosuvastatin  40 mg Oral QPM  . sodium chloride  3 mL Intravenous Q12H    Assessment/ Plan:   Active Problems:   Atrial fibrillation (HCC)   A-fib (HCC)   Persistent atrial fibrillation (HCC)  1. Acute on chronic systolic CHF: Will need further medicine titration as an outpatient May benefit from transition of care visit after discharge    2. Atrial fib:   Failed  cardioversion this admit.   If he is a surgical candidate for his MR, I would favor MAZE  as primary treatment strategy  3. Mitral regurg.:   Has severe MR. Plan is for him to see Dr Cornelius Moraswen as an outpatient in next couple weeks for consideration of mitral repair and maze  DC to home today  Hillis RangeJames Obed Samek MD, [facility] 08/04/2015 8:47 AM

## 2015-08-04 NOTE — Progress Notes (Signed)
Report received via Jessica L. RN using SBAR format, reviewed chart, orders, labs, VS, meds and patient's general condition, assumed care of patient. 

## 2015-08-04 NOTE — Discharge Instructions (Signed)
**PLEASE REMEMBER TO BRING ALL OF YOUR MEDICATIONS TO EACH OF YOUR FOLLOW-UP OFFICE VISITS.   Electrical Cardioversion, Care After Refer to this sheet in the next few weeks. These instructions provide you with information on caring for yourself after your procedure. Your health care provider may also give you more specific instructions. Your treatment has been planned according to current medical practices, but problems sometimes occur. Call your health care provider if you have any problems or questions after your procedure. WHAT TO EXPECT AFTER THE PROCEDURE After your procedure, it is typical to have the following sensations:  Some redness on the skin where the shocks were delivered. If this is tender, a sunburn lotion or hydrocortisone cream may help.  Possible return of an abnormal heart rhythm within hours or days after the procedure. HOME CARE INSTRUCTIONS  Take medicines only as directed by your health care provider. Be sure you understand how and when to take your medicine.  Learn how to feel your pulse and check it often.  Limit your activity for 48 hours after the procedure or as directed by your health care provider.  Avoid or minimize caffeine and other stimulants as directed by your health care provider. SEEK MEDICAL CARE IF:  You feel like your heart is beating too fast or your pulse is not regular.  You have any questions about your medicines.  You have bleeding that will not stop. SEEK IMMEDIATE MEDICAL CARE IF:  You are dizzy or feel faint.  It is hard to breathe or you feel short of breath.  There is a change in discomfort in your chest.  Your speech is slurred or you have trouble moving an arm or leg on one side of your body.  You get a serious muscle cramp that does not go away.  Your fingers or toes turn cold or blue.   This information is not intended to replace advice given to you by your health care provider. Make sure you discuss any questions you  have with your health care provider.   Document Released: 05/11/2013 Document Revised: 08/11/2014 Document Reviewed: 05/11/2013 Elsevier Interactive Patient Education 2016 [employer].     10 Habits of Highly Healthy People  [place] wants to help you get well and stay well.  Live a longer, healthier life by practicing healthy habits every day.  1.  Visit your primary care provider regularly. 2.  Make time for family and friends.  Healthy relationships are important. 3.  Take medications as directed by your provider. 4.  Maintain a healthy weight and a trim waistline. 5.  Eat healthy meals and snacks, rich in fruits, vegetables, whole grains, and lean proteins. 6.  Get moving every day - aim for 150 minutes of moderate physical activity each week. 7.  Don't smoke. 8.  Avoid alcohol or drink in moderation. 9.  Manage stress through meditation or mindful relaxation. 10.  Get seven to nine hours of quality sleep each night.  Want more information on healthy habits?  To learn more about these and other healthy habits, visit [email or web address]. _____________     Heart-Healthy Eating Plan Many factors influence your heart health, including eating and exercise habits. Heart (coronary) risk increases with abnormal blood fat (lipid) levels. Heart-healthy meal planning includes limiting unhealthy fats, increasing healthy fats, and making other small dietary changes. This includes maintaining a healthy body weight to help keep lipid levels within a normal range. WHAT IS MY PLAN?  Your health care provider recommends that  you:  Get no more than _________% of the total calories in your daily diet from fat.  Limit your intake of saturated fat to less than _________% of your total calories each day.  Limit the amount of cholesterol in your diet to less than _________ mg per day. WHAT TYPES OF FAT SHOULD I CHOOSE?  Choose healthy fats more often. Choose monounsaturated and  polyunsaturated fats, such as olive oil and canola oil, flaxseeds, walnuts, almonds, and seeds.  Eat more omega-3 fats. Good choices include salmon, mackerel, sardines, tuna, flaxseed oil, and ground flaxseeds. Aim to eat fish at least two times each week.  Limit saturated fats. Saturated fats are primarily found in animal products, such as meats, butter, and cream. Plant sources of saturated fats include palm oil, palm kernel oil, and coconut oil.  Avoid foods with partially hydrogenated oils in them. These contain trans fats. Examples of foods that contain trans fats are stick margarine, some tub margarines, cookies, crackers, and other baked goods. WHAT GENERAL GUIDELINES DO I NEED TO FOLLOW?  Check food labels carefully to identify foods with trans fats or high amounts of saturated fat.  Fill one half of your plate with vegetables and green salads. Eat 4-5 servings of vegetables per day. A serving of vegetables equals 1 cup of raw leafy vegetables,  cup of raw or cooked cut-up vegetables, or  cup of vegetable juice.  Fill one fourth of your plate with whole grains. Look for the word "whole" as the first word in the ingredient list.  Fill one fourth of your plate with lean protein foods.  Eat 4-5 servings of fruit per day. A serving of fruit equals one medium whole fruit,  cup of dried fruit,  cup of fresh, frozen, or canned fruit, or  cup of 100% fruit juice.  Eat more foods that contain soluble fiber. Examples of foods that contain this type of fiber are apples, broccoli, carrots, beans, peas, and barley. Aim to get 20-30 g of fiber per day.  Eat more home-cooked food and less restaurant, buffet, and fast food.  Limit or avoid alcohol.  Limit foods that are high in starch and sugar.  Avoid fried foods.  Cook foods by using methods other than frying. Baking, boiling, grilling, and broiling are all great options. Other fat-reducing suggestions include:  Removing the skin from  poultry.  Removing all visible fats from meats.  Skimming the fat off of stews, soups, and gravies before serving them.  Steaming vegetables in water or broth.  Lose weight if you are overweight. Losing just 5-10% of your initial body weight can help your overall health and prevent diseases such as diabetes and heart disease.  Increase your consumption of nuts, legumes, and seeds to 4-5 servings per week. One serving of dried beans or legumes equals  cup after being cooked, one serving of nuts equals 1 ounces, and one serving of seeds equals  ounce or 1 tablespoon.  You may need to monitor your salt (sodium) intake, especially if you have high blood pressure. Talk with your health care provider or dietitian to get more information about reducing sodium. WHAT FOODS CAN I EAT? Grains Breads, including [place], white, pita, wheat, raisin, rye, oatmeal, and [place]. Tortillas that are neither fried nor made with lard or trans fat. Low-fat rolls, including hotdog and hamburger buns and English muffins. Biscuits. Muffins. Waffles. Pancakes. Light popcorn. Whole-grain cereals. Flatbread. Melba toast. Pretzels. Breadsticks. Rusks. Low-fat snacks and crackers, including oyster, saltine, matzo,  graham, animal, and rye. Rice and pasta, including brown rice and those that are made with whole wheat. Vegetables All vegetables. Fruits All fruits, but limit coconut. Meats and Other Protein Sources Lean, well-trimmed beef, veal, pork, and lamb. Chicken and [place] without skin. All fish and shellfish. Wild duck, rabbit, pheasant, and venison. Egg whites or low-cholesterol egg substitutes. Dried beans, peas, lentils, and tofu.Seeds and most nuts. Dairy Low-fat or nonfat cheeses, including ricotta, string, and mozzarella. Skim or 1% milk that is liquid, powdered, or evaporated. Buttermilk that is made with low-fat milk. Nonfat or low-fat yogurt. Beverages Mineral water. Diet carbonated beverages. Sweets and  Desserts Sherbets and fruit ices. Honey, jam, marmalade, jelly, and syrups. Meringues and gelatins. Pure sugar candy, such as hard candy, jelly beans, gumdrops, mints, marshmallows, and small amounts of dark chocolate. [employer]. Eat all sweets and desserts in moderation. Fats and Oils Nonhydrogenated (trans-free) margarines. Vegetable oils, including soybean, sesame, sunflower, olive, peanut, safflower, corn, canola, and cottonseed. Salad dressings or mayonnaise that are made with a vegetable oil. Limit added fats and oils that you use for cooking, baking, salads, and as spreads. Other Cocoa powder. Coffee and tea. All seasonings and condiments. The items listed above may not be a complete list of recommended foods or beverages. Contact your dietitian for more options. WHAT FOODS ARE NOT RECOMMENDED? Grains Breads that are made with saturated or trans fats, oils, or whole milk. Croissants. Butter rolls. Cheese breads. Sweet rolls. Donuts. Buttered popcorn. Chow mein noodles. High-fat crackers, such as cheese or butter crackers. Meats and Other Protein Sources Fatty meats, such as hotdogs, short ribs, sausage, spareribs, bacon, ribeye roast or steak, and mutton. High-fat deli meats, such as salami and bologna. Caviar. Domestic duck and goose. Organ meats, such as kidney, liver, sweetbreads, brains, gizzard, chitterlings, and heart. Dairy Cream, sour cream, cream cheese, and creamed cottage cheese. Whole milk cheeses, including blue (bleu), [address], [place], [place], [address], [place], [address], [place], [place], and [place]. Whole or 2% milk that is liquid, evaporated, or condensed. Whole buttermilk. Cream sauce or high-fat cheese sauce. Yogurt that is made from whole milk. Beverages Regular sodas and drinks with added sugar. Sweets and Desserts Frosting. Pudding. Cookies. Cakes other than angel food cake. Candy that has milk chocolate or white chocolate, hydrogenated fat, butter, coconut,  or unknown ingredients. Buttered syrups. Full-fat ice cream or ice cream drinks. Fats and Oils Gravy that has suet, meat fat, or shortening. Cocoa butter, hydrogenated oils, palm oil, coconut oil, palm kernel oil. These can often be found in baked products, candy, fried foods, nondairy creamers, and whipped toppings. Solid fats and shortenings, including bacon fat, salt pork, lard, and butter. Nondairy cream substitutes, such as coffee creamers and sour cream substitutes. Salad dressings that are made of unknown oils, cheese, or sour cream. The items listed above may not be a complete list of foods and beverages to avoid. Contact your dietitian for more information.   This information is not intended to replace advice given to you by your health care provider. Make sure you discuss any questions you have with your health care provider.   Document Released: 04/29/2008 Document Revised: 08/11/2014 Document Reviewed: 01/12/2014 Elsevier Interactive Patient Education [employer]

## 2015-08-04 NOTE — Progress Notes (Deleted)
New IV placed in L. Wrist #22 Gauge with good blood return and flushes well, dressing applied and patient tolerated procedure well. Education provided and consent signed for his Cardioversion in the AM. Patient's left chest was shaved and cleaned in preparation for the procedure and all questions answered. Patient was given graham crackers/ peanut butter and diet coke, at his request, since he is receiving 60 units of Lantus and will be NPO after midnight, patient is aware and agreeable.

## 2015-08-07 ENCOUNTER — Encounter (HOSPITAL_COMMUNITY): Payer: Self-pay | Admitting: Cardiology

## 2015-08-09 ENCOUNTER — Encounter: Payer: Self-pay | Admitting: Thoracic Surgery (Cardiothoracic Vascular Surgery)

## 2015-08-09 ENCOUNTER — Institutional Professional Consult (permissible substitution) (INDEPENDENT_AMBULATORY_CARE_PROVIDER_SITE_OTHER): Payer: Medicare Other | Admitting: Thoracic Surgery (Cardiothoracic Vascular Surgery)

## 2015-08-09 ENCOUNTER — Other Ambulatory Visit: Payer: Self-pay | Admitting: *Deleted

## 2015-08-09 VITALS — BP 147/96 | HR 99 | Resp 16 | Ht 71.0 in | Wt 208.0 lb

## 2015-08-09 DIAGNOSIS — I712 Thoracic aortic aneurysm, without rupture, unspecified: Secondary | ICD-10-CM

## 2015-08-09 DIAGNOSIS — I071 Rheumatic tricuspid insufficiency: Secondary | ICD-10-CM | POA: Insufficient documentation

## 2015-08-09 DIAGNOSIS — I7409 Other arterial embolism and thrombosis of abdominal aorta: Secondary | ICD-10-CM

## 2015-08-09 DIAGNOSIS — I4819 Other persistent atrial fibrillation: Secondary | ICD-10-CM

## 2015-08-09 DIAGNOSIS — I255 Ischemic cardiomyopathy: Secondary | ICD-10-CM | POA: Diagnosis not present

## 2015-08-09 DIAGNOSIS — Z951 Presence of aortocoronary bypass graft: Secondary | ICD-10-CM | POA: Diagnosis not present

## 2015-08-09 DIAGNOSIS — I481 Persistent atrial fibrillation: Secondary | ICD-10-CM

## 2015-08-09 DIAGNOSIS — I340 Nonrheumatic mitral (valve) insufficiency: Secondary | ICD-10-CM

## 2015-08-09 DIAGNOSIS — I2510 Atherosclerotic heart disease of native coronary artery without angina pectoris: Secondary | ICD-10-CM

## 2015-08-09 DIAGNOSIS — I5042 Chronic combined systolic (congestive) and diastolic (congestive) heart failure: Secondary | ICD-10-CM

## 2015-08-09 DIAGNOSIS — R0602 Shortness of breath: Secondary | ICD-10-CM

## 2015-08-09 NOTE — Patient Instructions (Signed)
Continue all previous medications without any changes at this time  Schedule PFT's and CT angiogram as soon as practical  Schedule heart cath as soon as practical, and make certain that you know when to stop taking Eliquis before your cat

## 2015-08-09 NOTE — Progress Notes (Signed)
[address]       [place] [address]             [phone]     CARDIOTHORACIC SURGERY CONSULTATION REPORT  Referring Provider is Duke Salvia, MD  Primary Cardiologist is Lennette Bihari, MD PCP is [facility] Valrie Hart., MD  Chief Complaint  Patient presents with  . Mitral Regurgitation    eval for MVR/MAZE....ECHO 08/02/15, TEE 08/03/15  . Atrial Fibrillation  . Shortness of Breath    HPI:  Patient is a 70 year old male with known history of coronary artery disease, status post acute myocardial infarction in 2003.  He was noted to have ischemic cardiomyopathy and mild mitral regurgitation at that time. He underwent coronary artery bypass grafting 5 with grafts placed at time of surgery including left internal mammary artery to the distal left anterior ascending coronary artery, saphenous vein graft to the first diagonal branch, saphenous vein graft to the second obtuse marginal branch of the left circumflex coronary artery, and sequential saphenous vein graft to the posterior setting coronary artery and right posterior lateral branch. The patient recovered uneventfully and did very well clinically.  He was followed for a brief period of time by Dr. Tomie China in [place], but the patient states that he stopped going back for follow-up because he is prognosis was relatively guarded at best because of the degree of underlying left ventricular dysfunction caused by his large myocardial infarction.   The patient states that he did very well and remained asymptomatic until he suddenly began to experience symptoms of exertional shortness of breath in the fall of 2015.  He was hospitalized at that time and noted to be in rapid atrial flutter. There was initially some concern about the possibility of acute cholecystitis, but he was evaluated by general surgery and felt not to have symptomatic gallbladder disease.  Transthoracic echocardiogram performed at that time  demonstrated severe left ventricular systolic dysfunction with ejection fraction estimated 30-35%. There was moderate to severe mitral regurgitation. Similar findings were noted on transesophageal echocardiogram at the time of DC cardioversion. Following cardioversion the patient did well clinically and noticed considerable symptomatic improvement. He underwent left and right heart catheterization by Dr. Tresa Endo on 06/19/2014. He was found to have severe native vessel coronary artery disease with essentially 100% occlusion of all 3 major vascular territories. There remained widely patent left internal mammary artery graft to the left anterior descending coronary artery which filled the majority of the coronary arteries of the anterior wall. There was a widely patent vein graft to second obtuse marginal branch of left circumflex coronary artery which filled the majority of the branches of the lateral wall. Vein graft to the diagonal branch was chronically occluded, as were the sequential vein grafts to the right coronary circulation.  Pulmonary artery pressures were normal. Medical therapy was recommended.  The patient continued to do well for a proximally a year and was seen in follow-up on several occasions by Dr. Tresa Endo. He underwent a sleep study and was confirmed to have obstructive sleep apnea, although the patient refused to consider using CPAP.  The patient again developed relatively sudden onset of severe exertional shortness of breath in early November. He was seen in the emergency department and noted to be in atrial flutter. He was treated with cardioversion. He did well for approximately 4 weeks but again was admitted to the hospital in early December with recurrence of atrial fibrillation associated with acute  exacerbation of chronic combined systolic and diastolic congestive heart failure.  Prior to that admission the patient had stopped taking all medications except for aspirin.  During his  hospitalization he was aggressively diuresed and his shortness of breath resolved. He was restarted on Eliquis and discharge from the hospital with plans to perceived with repeat cardioversion in 4 weeks.  The patient was admitted for elective cardioversion on 08/02/2015. However, the patient failed cardioversion despite 4 countershocks including 3 at 200 J. The patient never converted from atrial fibrillation. Follow-up transthoracic and transesophageal echocardiograms were performed, demonstrating significant further decline in the patient's left ventricular systolic function and severe mitral regurgitation. The patient was referred for elective surgical consultation.  The patient is married and lives with his wife in [place], [place] near [place].  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 He has remained physically active and function independent until recently. Complains of is only significant physical limitation is that of progressive exertional shortness of breath. He states that at present is breathing is better than it was at the time of his hospitalization in early December, but he still gets short of breath with relatively mild physical activity and occasionally at rest. He intermittently has periods of worsening shortness of breath at night for which she sits up in bed. He states that at times she cannot lie flat. He feels his heart beating irregularly. He does not have any lower extremity edema at present but he had some mild swelling last month. He has had occasional mild dizzy spells without syncope. He has never experienced any symptoms of substernal chest pain or chest tightness either with activity or at rest.    The patient admits to intermittently stopping all medications over the past year because he felt that they were not helping him. He states that blood pressure medications have not been able to control  his blood pressure, and when that occurs he simply stopped taking them.   Past Medical History  Diagnosis Date  . CAD (coronary artery disease)     a. s/p CABG 2003  b. s/p acute MI prior to CABG w/ EF <30%  . Persistent atrial fibrillation (HCC)     a. s/p succesful TEE/DCCV on 06/01/14.  b. on Eliquis;  c. 07/2015 recurrent AF-->Failed DCCV.  Marland Kitchen Chronic systolic CHF (congestive heart failure) (HCC)     a. 2D ECHO: 05/17/2014: EF 30-35%;  b. 07/2015 TEE: EF 20-25%, diff HK, sev MR, sev dil LA w/o thrombus, mod reduced RV fxn, mod dil RA, mod TR.  Marland Kitchen Cholecystitis   . Hypertensive heart disease   . Hyperlipidemia   . Kidney stones X 1    "passed it" (08/02/2015)  . Chronic combined systolic and diastolic CHF (congestive heart failure) (HCC)   . Cardiomyopathy, ischemic: EF 20-25%   . OSA (obstructive sleep apnea) 10/11/2014    refuses to use CPAP  . Severe mitral regurgitation   . Tricuspid regurgitation   . S/P CABG x 5 02/16/2002    LIMA to LAD, SVG to D1, SVG to OM2, Sequential SVG to PDA-RPL, saphenous vein harvest from right thigh and lower leg    Past Surgical History  Procedure Laterality Date  . Tee without cardioversion N/A 06/01/2014    Procedure: TRANSESOPHAGEAL ECHOCARDIOGRAM (TEE);  Surgeon: Lars Masson, MD;  Location[facility] ENDOSCOPY;  Service: Cardiovascular;  Laterality: N/A;  . Cardioversion N/A 06/01/2014    Procedure:  CARDIOVERSION;  Surgeon: Lars Masson, MD;  Location: [facility] ENDOSCOPY;  Service: Cardiovascular;  Laterality: N/A;  . Coronary artery bypass graft  02/16/2002    CABG X5  . Cardiac catheterization N/A 06/19/2014    Procedure: RIGHT/LEFT HEART CATH AND CORONARY/GRAFT ANGIOGRAPHY;  Surgeon: Lennette Bihari, MD;  Location: [facility] CATH LAB;  Service: Cardiovascular;  Laterality: N/A;  . Cardiac catheterization  2003  . Cardioversion  06/11/2015; 08/02/2015  . Cardioversion N/A 08/02/2015    Procedure: CARDIOVERSION;  Surgeon: Wendall Stade, MD;   Location: [facility] ENDOSCOPY;  Service: Cardiovascular;  Laterality: N/A;  . Tee without cardioversion N/A 08/03/2015    Procedure: TRANSESOPHAGEAL ECHOCARDIOGRAM (TEE);  Surgeon: Lars Masson, MD;  Location: [facility] ENDOSCOPY;  Service: Cardiovascular;  Laterality: N/A;    Family History  Problem Relation Age of Onset  . Cancer Mother   . Cancer Father   . Hypertension Neg Hx   . Stroke Neg Hx     Social History   Social History  . Marital Status: Married    Spouse Name: N/A  . Number of Children: N/A  . Years of Education: N/A   Occupational History  . Retired    Social History Main Topics  . Smoking status: Former Smoker -- 1.00 packs/day for 40 years    Types: Cigarettes    Quit date: 02/02/2002  . Smokeless tobacco: Never Used  . Alcohol Use: Yes     Comment: 08/02/2015 "I rarely have a beer"  . Drug Use: No  . Sexual Activity: Yes   Other Topics Concern  . Not on file   Social History Narrative   Lives in [place] with wife.    Current Outpatient Prescriptions  Medication Sig Dispense Refill  . apixaban (ELIQUIS) 5 MG TABS tablet Take 1 tablet (5 mg total) by mouth 2 (two) times daily. 60 tablet 11  . furosemide (LASIX) 40 MG tablet Take 1 tablet (40 mg total) by mouth daily. 30 tablet 6  . isosorbide mononitrate (IMDUR) 30 MG 24 hr tablet Take 1 tablet (30 mg total) by mouth daily. 30 tablet 6  . lisinopril (PRINIVIL,ZESTRIL) 10 MG tablet Take 1 tablet (10 mg total) by mouth daily. 30 tablet 6  . metoprolol succinate (TOPROL-XL) 50 MG 24 hr tablet Take 1 tablet (50 mg total) by mouth 2 (two) times daily. Take with or immediately following a meal. 60 tablet 11  . Multiple Vitamin (MULTIVITAMIN WITH MINERALS) TABS tablet Take 1 tablet by mouth daily.    . potassium chloride (K-DUR,KLOR-CON) 10 MEQ tablet Take 1 tablet (10 mEq total) by mouth daily. 30 tablet 6  . rosuvastatin (CRESTOR) 40 MG tablet Take 1 tablet (40 mg total) by mouth every evening. 30 tablet 3  .  [DISCONTINUED] amLODipine (NORVASC) 5 MG tablet Take 1 tablet (5 mg total) by mouth daily. 30 tablet 6   No current facility-administered medications for this visit.    No Known Allergies    Review of Systems:   General:  normal appetite, decreased energy, no weight gain, no weight loss, no fever  Cardiac:  no chest pain with exertion, no chest pain at rest, + SOB with mild exertion, occasional resting SOB, occasional PND, + orthopnea, + palpitations, + arrhythmia, + atrial fibrillation, + LE edema, + dizzy spells, no syncope  Respiratory:  + shortness of breath, no home oxygen, no productive cough, + chronic dry cough that he attributes to medications, no bronchitis, no wheezing, no hemoptysis, no asthma, no  pain with inspiration or cough, + sleep apnea, no CPAP at night  GI:   no difficulty swallowing, no reflux, no frequent heartburn, no hiatal hernia, no abdominal pain, no constipation, no diarrhea, no hematochezia, no hematemesis, no melena  GU:   no dysuria,  no frequency, no urinary tract infection, no hematuria, no enlarged prostate, + kidney stones, no kidney disease  Vascular:  no pain suggestive of claudication, no pain in feet, no leg cramps, no varicose veins, no DVT, no non-healing foot ulcer  Neuro:   no stroke, no TIA's, no seizures, no headaches, no temporary blindness one eye,  no slurred speech, no peripheral neuropathy, no chronic pain, no instability of gait, no memory/cognitive dysfunction  Musculoskeletal: no arthritis, no joint swelling, no myalgias, no difficulty walking, normal mobility   Skin:   no rash, no itching, no skin infections, no pressure sores or ulcerations  Psych:   no anxiety, no depression, no nervousness, no unusual recent stress  Eyes:   no blurry vision, no floaters, no recent vision changes, does not wear glasses or contacts  ENT:   no hearing loss, no loose or painful teeth, no dentures, last saw dentist within the past month  Hematologic:  + easy  bruising, no abnormal bleeding, no clotting disorder, no frequent epistaxis  Endocrine:  no diabetes, does not check CBG's at home     Physical Exam:   BP 147/96 mmHg  Pulse 99  Resp 16  Ht 5\' 11"  (1.803 m)  Wt 208 lb (94.348 kg)  BMI 29.02 kg/m2  SpO2 97%  General:  Mildly obese but o/w  well-appearing  HEENT:  Unremarkable   Neck:   no JVD, no bruits, no adenopathy   Chest:   clear to auscultation, symmetrical breath sounds, no wheezes, no rhonchi   CV:   RRR, grade II/VI systolic murmur   Abdomen:  soft, non-tender, no masses   Extremities:  warm, well-perfused, pulses diminished but palpable, no LE edema  Rectal/GU  Deferred  Neuro:   Grossly non-focal and symmetrical throughout  Skin:   Clean and dry, no rashes, no breakdown   Diagnostic Tests:  Transthoracic Echocardiography  Patient:  Dakoda, Lav[place] MR #:    [id number] Study Date: 05/17/2014 Gender:   M Age:    43 Height:   180.3 cm Weight:   96.8 kg BSA:    2.22 m^2 Pt. Status: Room:    2C14C  ATTENDING  Nicki Guadalajara, M.D. SONOGRAPHER Cathie Beams ORDERING   Barrett, Joline Salt PERFORMING  [facility], Inpatient  cc:  ------------------------------------------------------------------- LV EF: 30% -  35%  ------------------------------------------------------------------- Indications:   CHF - 428.0.  ------------------------------------------------------------------- History:  PMH: Atrial flutter with rapid ventricular response. Atrial fibrillation. Risk factors: Hypertension.  ------------------------------------------------------------------- Study Conclusions  - Left ventricle: The cavity size was normal. There was mild concentric hypertrophy. Systolic function was moderately to severely reduced. The estimated ejection fraction was in the range of 30% to 35%. Diffuse hypokinesis. There is akinesis of the basalinferior myocardium. There is severe  hypokinesis of the mid-apicalinferior myocardium. - Aortic valve: Moderate thickening and calcification, consistent with sclerosis. - Mitral valve: There was mild regurgitation.  Transthoracic echocardiography. M-mode, complete 2D, spectral Doppler, and color Doppler. Birthdate: Patient birthdate: Mar 04, 1946. Age: Patient is 70 yr old. Sex: Gender: male. BMI: 29.8 kg/m^2. Blood pressure:   90/54 Patient status: Inpatient. Study date: Study date: 05/17/2014. Study time: 11:58 AM. Location: ICU/CCU  -------------------------------------------------------------------  ------------------------------------------------------------------- Left ventricle: The cavity size was normal.  There was mild concentric hypertrophy. Systolic function was moderately to severely reduced. The estimated ejection fraction was in the range of 30% to 35%. Diffuse hypokinesis. Regional wall motion abnormalities:  There is akinesis of the basalinferior myocardium. There is severe hypokinesis of the mid-apicalinferior myocardium.  ------------------------------------------------------------------- Aortic valve:  Trileaflet. Moderate thickening and calcification, consistent with sclerosis. Mobility was not restricted. Doppler: Transvalvular velocity was within the normal range. There was no stenosis. There was no regurgitation.  ------------------------------------------------------------------- Aorta: Aortic root: The aortic root was normal in size.  ------------------------------------------------------------------- Mitral valve:  Structurally normal valve.  Mobility was not restricted. Doppler: Transvalvular velocity was within the normal range. There was no evidence for stenosis. There was mild regurgitation.  Peak gradient (D): 3 mm Hg.  ------------------------------------------------------------------- Left atrium: The atrium was normal in  size.  ------------------------------------------------------------------- Right ventricle: The cavity size was normal. Wall thickness was normal. Systolic function was normal.  ------------------------------------------------------------------- Pulmonic valve:  Doppler: Transvalvular velocity was within the normal range. There was no evidence for stenosis.  ------------------------------------------------------------------- Tricuspid valve:  Structurally normal valve.  Doppler: Transvalvular velocity was within the normal range. There was no regurgitation.  ------------------------------------------------------------------- Pulmonary artery:  The main pulmonary artery was normal-sized. Systolic pressure was within the normal range.  ------------------------------------------------------------------- Right atrium: The atrium was normal in size.  ------------------------------------------------------------------- Pericardium: There was no pericardial effusion.  ------------------------------------------------------------------- Systemic veins: Inferior vena cava: The vessel was normal in size.  ------------------------------------------------------------------- Measurements  Left ventricle             Value    Reference LV ID, ED, PLAX chordal    (H)   58.8 mm   43 - 52 LV ID, ES, PLAX chordal    (H)   53.5 mm   23 - 38 LV fx shortening, PLAX chordal (L)   9   %   >=29 LV PW thickness, ED          13.1 mm   --------- IVS/LV PW ratio, ED          0.92     <=1.3  Ventricular septum           Value    Reference IVS thickness, ED           12.1 mm   ---------  Aorta                 Value    Reference Aortic root ID, ED           36  mm   ---------  Left atrium              Value    Reference LA ID, A-P,  ES             50  mm   --------- LA ID/bsa, A-P         (H)   2.25 cm/m^2 <=2.2 LA volume, ES, 1-p A4C         115  ml   --------- LA volume/bsa, ES, 1-p A4C       51.7 ml/m^2 --------- LA volume, ES, 1-p A2C         126  ml   --------- LA volume/bsa, ES, 1-p A2C       56.6 ml/m^2 ---------  Mitral valve              Value    Reference Mitral E-wave peak velocity      87.8 cm/s  --------- Mitral A-wave peak  velocity      38.8 cm/s  --------- Mitral deceleration time        169  ms   150 - 230 Mitral peak gradient, D        3   mm Hg --------- Mitral E/A ratio, peak         2.2     ---------  Pulmonary arteries           Value    Reference PA pressure, S, DP           27  mm Hg <=30  Tricuspid valve            Value    Reference Tricuspid regurg peak velocity     219  cm/s  --------- Tricuspid peak RV-RA gradient     19  mm Hg ---------  Systemic veins             Value    Reference Estimated CVP             8   mm Hg ---------  Right ventricle            Value    Reference RV pressure, S, DP           27  mm Hg <=30  Legend: (L) and (H) mark values outside specified reference range.  ------------------------------------------------------------------- Prepared and Electronically Authenticated by  Armanda Magic, MD 2015-10-14T13:23:51   Transesophageal Echocardiography with Cardioversion  Patient:  Male, Minish MR #:    [id number] Study Date: 06/01/2014 Gender:   M Age:    38 Height:   154.9 cm Weight:   100 kg BSA:    2.13 m^2 Pt. Status: Room:    3E04C  ADMITTING  Thurmon Fair, MD ATTENDING  Thurmon Fair, MD Lovey Newcomer, Christopher REFERRING   Verne Carrow SONOGRAPHER Jimmy Reel, RDCS PERFORMING  Tobias Alexander, M.D. SONOGRAPHER Britta Mccreedy Rix  cc:  ------------------------------------------------------------------- LV EF: 30% -  35%  ------------------------------------------------------------------- History:  PMH:  Atrial fibrillation.  ------------------------------------------------------------------- Study Conclusions  - Left ventricle: The cavity size was mildly dilated. Systolic function was moderately to severely reduced. The estimated ejection fraction was in the range of 30% to 35%. Diffuse hypokinesis. No evidence of thrombus. - Aortic valve: Moderately thickened, moderately calcified leaflets. There was no stenosis. There was no regurgitation. - Aorta: Mild plaque. - Mitral valve: No evidence of vegetation. There was moderate to severe regurgitation with systolic reversal of flow in pulmonary veins. - Left atrium: The atrium was dilated. No evidence of thrombus in the atrial cavity or appendage. No evidence of thrombus in the atrial cavity or appendage. Emptying velocity was moderately reduced. - Right ventricle: Systolic function was mildly reduced. - Right atrium: No evidence of thrombus in the atrial cavity or appendage. - Tricuspid valve: There was mild-moderate regurgitation. - Pulmonic valve: No evidence of vegetation. - Pericardium, extracardiac: There was no pericardial effusion.  Impressions:  - Successful cardioversion. No cardiac source of emboli was indentified.  Transesophageal echocardiography with cardioversion. 2D and intravenous contrast injection. Birthdate: Patient birthdate: 03/29/1946. Age: Patient is 70 yr old. Sex: Gender: male. BMI: 41.7 kg/m^2. Blood pressure:   145/87 Patient status: Inpatient. Study date: Study date: 06/01/2014. Study time: 11:28 AM. Location:  Endoscopy.  -------------------------------------------------------------------  ------------------------------------------------------------------- Left ventricle: The cavity size was mildly dilated. Systolic function was moderately to severely reduced. The estimated ejection fraction was in the range of 30% to 35%. Diffuse hypokinesis. No evidence of  thrombus.  ------------------------------------------------------------------- Aortic valve:  Moderately thickened, moderately calcified leaflets. Doppler:  There was no stenosis.  There was no regurgitation.  ------------------------------------------------------------------- Aorta: Mild plaque.  ------------------------------------------------------------------- Mitral valve:  Structurally normal valve.  Leaflet separation was normal. No evidence of vegetation. Doppler: There was moderate to severe regurgitation with systolic reversal of flow in pulmonary veins.  ------------------------------------------------------------------- Left atrium: The atrium was dilated. No evidence of thrombus in the atrial cavity or appendage. No evidence of thrombus in the atrial cavity or appendage. Emptying velocity was moderately reduced.  ------------------------------------------------------------------- Right ventricle: The cavity size was normal. Wall thickness was normal. Systolic function was mildly reduced.  ------------------------------------------------------------------- Pulmonic valve:  Structurally normal valve.  Cusp separation was normal. No evidence of vegetation.  ------------------------------------------------------------------- Tricuspid valve:  Structurally normal valve.  Doppler: There was mild-moderate regurgitation.  ------------------------------------------------------------------- Right atrium: The atrium was normal in size. No evidence of thrombus in the atrial cavity or  appendage.  ------------------------------------------------------------------- Pericardium: The pericardium was normal in appearance. There was no pericardial effusion.  ------------------------------------------------------------------- Post procedure conclusions Ascending Aorta:  - Mild plaque.  ------------------------------------------------------------------- Prepared and Electronically Authenticated by  Tobias Alexander, M.D. 2015-10-29T16:28:36    RIGHT AND LEFT HEART CARDIAC CATHETERIZATION   HISTORY:   ISAIA HASSELL is a 70 y.o. male who underwent CABG revascularization surgery 5 in 2003, by Dr. [name] with a LIMA to the LAD, saphenous vein graft to the OM 2, saphenous vein graft to a diagonal 1, and sequential vein graft to the PDA and PLA branches of the RCA. He had not had Cardiologic follow-up in over 12 years. He recently developed atrial flutter and underwent successful cardioversion. He was found to have an ejection fraction of 30-35% and a TEE suggested moderately severe MR and at least moderate TR. A nuclear perfusion study was high risk with several regions of ischemia. He is now referred for definitive right and left heart cardiac catheterization.   PROCEDURE: Right and left heart catheterization: Swan-Ganz catheterization with cardiac output determination by the thermodilution and Fick methods, coronary angiography into the native coronary arteries, selective angiography into the saphenous vein grafts and left internal mammary artery, left ventriculography.  The patient was brought to the second floor [facility] lab in the fasting state. Versed 2 mg and fentanyl 50 mcg were administered for conscious sedation. The right groin was prepped and draped in sterile fashion and a 7 French arterial and venous sheaths were inserted without difficulty. A Swan-Ganz catheter was advanced into the venous sheath and pressures were obtained in the right  atrium, right ventricle, pulmonary artery, and pulmonary capillary wedge position. Cardiac outputs were obtained by the thermodilution and assumed Fick methods. Oxygen saturation was obtained in the pulmonary artery and aorta. A pigtail catheter was inserted and simultaneous AO/PA pressures were recorded. The pigtail catheter was advanced into the left ventricle and simultaneous left ventricular and PCW pressures were recorded. Left ventriculography was performed in the RAO projection. A left ventricle to aorta pullback was performed. The pigtail catheter was then removed and diagnostic catheterization to delineate the coronary anatomy was performed utilizing 5 French JL 5, JR$ and LIMA diagnostic catheters. All catheters were removed and the patient. Hemostasis was obtained by direct manual pressure. The patient tolerated the procedure well and returned to his room in satisfactory condition.   HEMODYNAMICS:   RA: 2 RV: 20/2 PA: 20/6 PC: mean 5 a 8 V 5 LV: 120/11  AO: 120/65  Oxygen saturation in the aorta 99%  and the pulmonary artery 69%  Cardiac output: 4.0 l/min (Thermo); 4.2 (Fick)  Cardiac index: 1.9 l/m/m2 2.0  ANGIOGRAPHY:   Left main: A long angiographically normal vessel which gave rise to an intermediate and left circumflex. Next cardio artery. The LAD was occluded at its origin from the left main  LAD: Totally occluded at its origin from the left main  Ramus intermediate: Normal small vessel.   Left circumflex: Moderate size vessel that had 70% proximal stenosis. The marginal branches were not visualized from the left injection. There was collateralization to the distal RCA. The the distal circumflex.   Right coronary artery: Totally occluded at its origin.  SVG to the OM 2: This graft was a large graft which had mild luminal irregularities proximally in its midsegment. The graft anastomosis into the OM2. There was filling of the AV groove  circumflex as well as the OM1 vessel via this graft. There was no distal disease beyond the graft anastomosis in the OM 2 vessel.  Saphenous vein graft to the diagonal vessel is totally occluded at its origin.  Saphenous vein graft which had been a sequential graft supplying the PDA and PLA vessel is totally occluded at its origin.  The LIMA graft was a large caliber graft which was widely patent. The LAD filled retrograde back up to the left main. The LAD distal to the graft was free of significant disease. There were collateral supplying the diagonal vessel as well as collateral supplying an acute marginal branch of the RCA with moderate filling of the mid RCA via this collateral system  Left ventriculography Revealed a enlarged left ventricle with global hypokinesis and an ejection fraction of 35%. Only trivial mitral regurgitation was appreciated.  Total contrast used: 95 cc Omnipaque  IMPRESSION:  Ischemic cardiomyopathy with an ejection fraction of 35% and diffuse hypo-contractility and trivial mitral regurgitation.  Severe native coronary obstructive disease with total occlusion of the LAD at at its origin; 70% proximal left circumflex stenosis; and total occlusion of the proximal RCA.  Patent LIMA graft supplying the LAD with retrograde filling up to the left main and distal filling with collateralization to both the diagonal vessel as well as to the mid RCA.  Patent saphenous vein graft with mild luminal irregularities supplying the obtuse marginal 2 vessel of the left circumflex artery with filling of the AV groove circumflex and the OM1 vessel via this graft.  Occluded vein graft which had supplied the diagonal 1 vessel.  Occluded vein graft,which had sequentially supplied the PDA and PLA branches of the RCA.  RECOMMENDATION:  Medical therapy for the patient's CAD and graft occlusion with beta blocker, Ace-IARB therapy, nitrates, and possible ranolazine. The patient will  resume Eliquis tomorrow at 5 mg twice a day in light of his recent paroxysmal atrial flutter and his ischemic cardiomyopathy.   Lennette Bihari, MD, [facility] 06/19/2014 10:10 AM   Transthoracic Echocardiography  Patient:  Bron, Snellings MR #:    [id number] Study Date: 08/02/2015 Gender:   M Age:    41 Height:   180.3 cm Weight:   96.6 kg BSA:    2.22 m^2 Pt. Status: Room:    3W03C  ADMITTING  Bryan Lemma, MD ATTENDING  Bryan Lemma, MD SONOGRAPHER Lanae Crumbly, RDCS PERFORMING  [facility], Inpatient Mellissa Kohut, Amber K REFERRING  Glory Buff, Amber K  cc:  ------------------------------------------------------------------- LV EF: 20% -  25%  ------------------------------------------------------------------- Indications:   Atrial fibrillation - 427.31. Mitral regurgitation 424.0.  -------------------------------------------------------------------  History:  PMH: Post cardioversion. Atrial fibrillation. Congestive heart failure. Risk factors: Hypertension.  ------------------------------------------------------------------- Study Conclusions  - Left ventricle: The cavity size was moderately dilated. There was mild concentric hypertrophy. Systolic function was severely reduced. The estimated ejection fraction was in the range of 20% to 25%. Diffuse hypokinesis. - Mitral valve: There was moderate to severe regurgitation directed centrally. - Left atrium: The atrium was severely dilated. - Right ventricle: Systolic function was moderately reduced. - Right atrium: The atrium was mildly dilated. - Tricuspid valve: There was moderate regurgitation. - Pulmonary arteries: Systolic pressure was mildly increased. PA peak pressure: 33 mm Hg (S). - Line: A venous catheter was visualized in the superior vena cava, with its tip in the right atrium. No abnormal features noted.  Impressions:  - When compared to the  prior study from 06/01/2014, LVEF has further deteriorated, now 25-30%. There is also moderate RV systolic dysfunction.  Transthoracic echocardiography. M-mode, complete 2D, spectral Doppler, and color Doppler. Birthdate: Patient birthdate: [birth year]. Age: Patient is 69 yr old. Sex: Gender: male. BMI: 29.7 kg/m^2. Blood pressure:   150/90 Patient status: Inpatient. Study date: Study date: 08/02/2015. Study time: 04:40 PM. Location: Bedside.  -------------------------------------------------------------------  ------------------------------------------------------------------- Left ventricle: The cavity size was moderately dilated. There was mild concentric hypertrophy. Systolic function was severely reduced. The estimated ejection fraction was in the range of 20% to 25%. Diffuse hypokinesis. The study was not technically sufficient to allow evaluation of LV diastolic dysfunction due to atrial fibrillation.  ------------------------------------------------------------------- Aortic valve:  Trileaflet; mildly thickened, mildly calcified leaflets. Mobility was not restricted. Sclerosis without stenosis. Doppler: Transvalvular velocity was minimally increased. There was no stenosis. There was no regurgitation.  ------------------------------------------------------------------- Aorta: Aortic root: The aortic root was normal in size.  ------------------------------------------------------------------- Mitral valve:  Mildly thickened leaflets . Mobility was not restricted. Doppler: Transvalvular velocity was within the normal range. There was no evidence for stenosis. There was moderate to severe regurgitation directed centrally.  ------------------------------------------------------------------- Left atrium: The atrium was severely dilated.  ------------------------------------------------------------------- Right ventricle: The cavity size was normal.  Wall thickness was normal. Systolic function was moderately reduced.  ------------------------------------------------------------------- Pulmonic valve:  Structurally normal valve.  Cusp separation was normal. Doppler: Transvalvular velocity was within the normal range. There was no evidence for stenosis. There was no regurgitation.  ------------------------------------------------------------------- Tricuspid valve:  Structurally normal valve.  Doppler: Transvalvular velocity was within the normal range. There was moderate regurgitation.  ------------------------------------------------------------------- Pulmonary artery:  The main pulmonary artery was normal-sized. Systolic pressure was mildly increased.  ------------------------------------------------------------------- Right atrium: The atrium was mildly dilated. Pacer wire or catheter noted in right atrium.  ------------------------------------------------------------------- Pericardium: There was no pericardial effusion.  ------------------------------------------------------------------- Systemic veins: Line: A venous catheter was visualized in the superior vena cava, with its tip in the right atrium. No abnormal features noted. Inferior vena cava: The vessel was dilated. The respirophasic diameter changes were blunted (< 50%), consistent with elevated central venous pressure.  ------------------------------------------------------------------- Measurements  Left ventricle               Value    Reference LV ID, ED, PLAX chordal       (H)   60.6 mm   43 - 52 LV ID, ES, PLAX chordal       (H)   56.1 mm   23 - 38 LV fx shortening, PLAX chordal   (L)   7   %   >=29 LV PW thickness, ED  13  mm   --------- IVS/LV PW ratio, ED             0.92     <=1.3 LV end-diastolic volume, 1-p A4C      409  ml    --------- LV ejection fraction, 1-p A4C        28  %   --------- LV end-diastolic volume/bsa, 1-p      92  ml/m^2 --------- A4C LV end-diastolic volume, 2-p        195  ml   --------- LV end-systolic volume, 2-p         144  ml   --------- LV ejection fraction, 2-p          26  %   --------- Stroke volume, 2-p             51  ml   --------- LV end-diastolic volume/bsa, 2-p      88  ml/m^2 --------- LV end-systolic volume/bsa, 2-p       65  ml/m^2 --------- Stroke volume/bsa, 2-p           23  ml/m^2 ---------  Ventricular septum             Value    Reference IVS thickness, ED              11.9 mm   ---------  LVOT                    Value    Reference LVOT ID, S                 22  mm   --------- LVOT area                  3.8  cm^2  ---------  Aorta                    Value    Reference Aortic root ID, ED             31  mm   ---------  Left atrium                 Value    Reference LA ID, A-P, ES               59  mm   --------- LA ID/bsa, A-P           (H)   2.66 cm/m^2 <=2.2 LA volume, S                152  ml   --------- LA volume/bsa, S              68.4 ml/m^2 --------- LA volume, ES, 1-p A4C           147  ml   --------- LA volume/bsa, ES, 1-p A4C         66.2 ml/m^2 --------- LA volume, ES, 1-p A2C           147  ml   --------- LA volume/bsa, ES, 1-p A2C         66.2 ml/m^2 ---------  Pulmonary arteries             Value    Reference PA pressure, S, DP         (H)   33  mm Hg <=30  Tricuspid valve  Value     Reference Tricuspid regurg peak velocity       235  cm/s  --------- Tricuspid peak RV-RA gradient        22  mm Hg ---------  Systemic veins               Value    Reference Estimated CVP                8   mm Hg ---------  Right ventricle               Value    Reference RV pressure, S, DP             30  mm Hg <=30  Legend: (L) and (H) mark values outside specified reference range.  ------------------------------------------------------------------- Prepared and Electronically Authenticated by  Tobias Alexander, M.D. 2016-12-29T17:47:55   Transesophageal Echocardiography  Patient:  Kazuo, Durnil MR #:    [id number] Study Date: 08/03/2015 Gender:   M Age:    35 Height:   180.3 cm Weight:   95.9 kg BSA:    2.21 m^2 Pt. Status: Room:    3W03C  ATTENDING  Berton Mount, M.D. ADMITTING  Bryan Lemma, MD SONOGRAPHER Delcie Roch, RDCS, CCT ORDERING   Tobias Alexander, M.D. PERFORMING  Tobias Alexander, M.D.  cc:  ------------------------------------------------------------------- LV EF: 20% -  25%  ------------------------------------------------------------------- Study Conclusions  - Left ventricle: The cavity size was moderately dilated. Systolic function was severely reduced. The estimated ejection fraction was in the range of 20% to 25%. Diffuse hypokinesis. - Aortic valve: No evidence of vegetation. - Mitral valve: There is severe mitral regurgitation with centrally directed jet. The severity is based on systolic flow reversal in two pulmonary veins and regurgitant jet covering > 50% of the left atrium. PISA inacurate as there are at least two jets. Mitral valve leaflets are mildly thickened and not calcified and therefore favorable for repair. . There was severe regurgitation directed centrally. -  Left atrium: The atrium was severely dilated. No evidence of thrombus in the atrial cavity or appendage. No evidence of thrombus in the atrial cavity or appendage. No evidence of thrombus in the appendage. - Right ventricle: Systolic function was moderately reduced. - Right atrium: The atrium was moderately dilated. No evidence of thrombus in the atrial cavity or appendage. - Tricuspid valve: There was moderate regurgitation. - Pulmonic valve: No evidence of vegetation.  Impressions:  - There is severe mitral regurgitation with centrally directed jet. The severity is based on systolic flow reversal in two pulmonary veins and regurgitant jet covering > 50% of the left atrium. PISA inacurate as there are at least two jets. Mitral valve leaflets are mildly thickened and not calcified and therefore favorable for repair. .  Diagnostic transesophageal echocardiography. 2D and color Doppler. Birthdate: Patient birthdate: 03-02-1946. Age: Patient is 70 yr old. Sex: Gender: male.  BMI: 29.5 kg/m^2. Blood pressure: 143/105 Study date: Study date: 08/03/2015. Study time: 11:32 AM.  -------------------------------------------------------------------  ------------------------------------------------------------------- Left ventricle: The cavity size was moderately dilated. Systolic function was severely reduced. The estimated ejection fraction was in the range of 20% to 25%. Diffuse hypokinesis.  ------------------------------------------------------------------- Aortic valve:  Structurally normal valve. Trileaflet; normal thickness leaflets. Cusp separation was normal. No evidence of vegetation. Doppler: There was no regurgitation.  ------------------------------------------------------------------- Aorta: There was no atheroma. There was no evidence for dissection. Aortic root: The aortic root was not dilated. Ascending aorta: The ascending aorta was  normal in size. Aortic  arch: The aortic arch was normal in size. Descending aorta: The descending aorta was normal in size.  ------------------------------------------------------------------- Mitral valve: There is severe mitral regurgitation with centrally directed jet. The severity is based on systolic flow reversal in two pulmonary veins and regurgitant jet covering > 50% of the left atrium. PISA inacurate as there are at least two jets. Mitral valve leaflets are mildly thickened and not calcified and therefore favorable for repair. . Structurally normal valve. Leaflet separation was normal. Doppler: There was severe regurgitation directed centrally.  ------------------------------------------------------------------- Left atrium: The atrium was severely dilated. No evidence of thrombus in the atrial cavity or appendage. No evidence of thrombus in the atrial cavity or appendage. No evidence of thrombus in the appendage. The appendage was morphologically a left appendage, multilobulated, and of normal size. Emptying velocity significantly decreased.  ------------------------------------------------------------------- Right ventricle: The cavity size was normal. Wall thickness was normal. Systolic function was moderately reduced.  ------------------------------------------------------------------- Pulmonic valve:  Structurally normal valve.  Cusp separation was normal. No evidence of vegetation.  ------------------------------------------------------------------- Tricuspid valve:  Structurally normal valve.  Leaflet separation was normal. Doppler: There was moderate regurgitation.  ------------------------------------------------------------------- Pulmonary artery:  The main pulmonary artery was normal-sized.  ------------------------------------------------------------------- Right atrium: The atrium was moderately dilated. No evidence of thrombus in the  atrial cavity or appendage. The appendage was morphologically a right appendage.  ------------------------------------------------------------------- Pericardium: There was no pericardial effusion.  ------------------------------------------------------------------- Prepared and Electronically Authenticated by  Tobias Alexander, M.D. 2016-12-30T12:53:25        Impression:  I have personally reviewed the patient's recent transthoracic and transesophageal echocardiograms as well as diagnostic cardiac catheterization and echocardiograms performed in the fall of 2015. The patient has long-standing coronary artery disease with ischemic cardiomyopathy, ischemic mitral regurgitation, and chronic combined systolic and diastolic congestive heart failure.  He developed an acute exacerbation of chronic symptoms of congestive heart failure in October 2015 in the setting of atrial flutter with rapid ventricular response.  Transthoracic and transesophageal echocardiograms performed at that time revealed severe left ventricular systolic dysfunction and moderate to severe mitral regurgitation. Diagnostic cardiac catheterization was notable for the presence of severe native coronary artery disease but patent grafts supplying both the anterior wall and the lateral wall from previous coronary artery bypass surgery. Vein grafts to the right coronary circulation were chronically occluded, as was the native right coronary artery.  He was treated with cardioversion and did relatively well for approximately 1 year, but over the past 2 months he has had significant recurrence of symptoms of congestive heart failure in the setting of recurrent persistent atrial fibrillation that has failed multiple attempts at cardioversion. Transthoracic and transesophageal echocardiograms both confirm the presence of severe left ventricular systolic dysfunction, type IIIB mitral valve dysfunction with severe mitral regurgitation and  probably moderate tricuspid regurgitation. There is severe bilateral atrial enlargement.  Both left and right ventricular function appear noticeably worse than they did in 2015.  I think it is very unlikely that any further attempts to keep this patient in sinus rhythm will be likely to succeed unless his mitral regurgitation can be definitively treated.  It might be reasonable to consider mitral valve repair or replacement with Maze procedure, although any type of surgical intervention will be relatively high risk.  Based upon the severity of left ventricular systolic dysfunction and the degree of systolic tenting of the mitral valve and subvalvular apparatus, mitral valve replacement may be required. Concomitant tricuspid valve repair may need to be considered as well.  He  will need follow-up left and right heart catheterization to be performed if surgery were to be considered, and there remains a possibility that redo CABG might be warranted. Most importantly, even following successful surgery the patient's long-term prognosis will remain guarded at best because of his underlying severe ischemic heart disease.  Long-term compliance with medical therapy will be critically important.     Plan:  The patient and his wife were counseled at length regarding the patient's numerous ongoing clinical problems as well as the indications, risks and potential benefits of mitral valve repair or replacement and maze procedure.  The implications regarding the patient's underlying severe ischemic cardiomyopathy on both short-term treatment and long-term prognosis were discussed at length.  All of their questions were addressed. The patient is interested in proceeding with diagnostic cardiac catheterization in the near future and further discussions regarding the possible risks and benefits of surgery.  Pulmonary function tests will be performed as well as CT angiography to screen for the presence of significant aortoiliac  occlusive disease.  We will make arrangements for left and right heart catheterization in the near future. He might benefit from close follow-up through the advanced heart failure clinic.   I spent in excess of 90 minutes during the conduct of this office consultation and >50% of this time involved direct face-to-face encounter with the patient for counseling and/or coordination of their care.   Salvatore Decent. [name], MD 08/09/2015 4:08 PM

## 2015-08-13 ENCOUNTER — Ambulatory Visit: Payer: Medicare Other | Admitting: Cardiology

## 2015-08-15 ENCOUNTER — Ambulatory Visit
Admission: RE | Admit: 2015-08-15 | Discharge: 2015-08-15 | Disposition: A | Discharge status: Home / Self Care | Payer: Medicare Other | Source: Ambulatory Visit | Attending: Thoracic Surgery (Cardiothoracic Vascular Surgery) | Admitting: Thoracic Surgery (Cardiothoracic Vascular Surgery)

## 2015-08-15 ENCOUNTER — Ambulatory Visit (HOSPITAL_COMMUNITY)
Admission: RE | Admit: 2015-08-15 | Discharge: 2015-08-15 | Disposition: A | Discharge status: Home / Self Care | Payer: Medicare Other | Source: Ambulatory Visit | Attending: Thoracic Surgery (Cardiothoracic Vascular Surgery) | Admitting: Thoracic Surgery (Cardiothoracic Vascular Surgery)

## 2015-08-15 DIAGNOSIS — I7409 Other arterial embolism and thrombosis of abdominal aorta: Secondary | ICD-10-CM

## 2015-08-15 DIAGNOSIS — Z01818 Encounter for other preprocedural examination: Secondary | ICD-10-CM | POA: Diagnosis not present

## 2015-08-15 DIAGNOSIS — F17210 Nicotine dependence, cigarettes, uncomplicated: Secondary | ICD-10-CM | POA: Diagnosis not present

## 2015-08-15 DIAGNOSIS — R0609 Other forms of dyspnea: Secondary | ICD-10-CM | POA: Diagnosis not present

## 2015-08-15 DIAGNOSIS — I712 Thoracic aortic aneurysm, without rupture, unspecified: Secondary | ICD-10-CM

## 2015-08-15 DIAGNOSIS — R05 Cough: Secondary | ICD-10-CM | POA: Diagnosis not present

## 2015-08-15 DIAGNOSIS — Z01810 Encounter for preprocedural cardiovascular examination: Secondary | ICD-10-CM | POA: Diagnosis not present

## 2015-08-15 LAB — PULMONARY FUNCTION TEST
DL/VA % PRED: 82 %
DL/VA: 3.74 ml/min/mmHg/L
DLCO unc % pred: 37 %
DLCO unc: 11.72 ml/min/mmHg
FEF 25-75 POST: 3.49 L/s
FEF 25-75 Pre: 2.14 L/sec
FEF2575-%CHANGE-POST: 62 %
FEF2575-%PRED-POST: 143 %
FEF2575-%Pred-Pre: 88 %
FEV1-%CHANGE-POST: 11 %
FEV1-%PRED-PRE: 82 %
FEV1-%Pred-Post: 92 %
FEV1-PRE: 2.63 L
FEV1-Post: 2.94 L
FEV1FVC-%CHANGE-POST: 4 %
FEV1FVC-%PRED-PRE: 103 %
FEV6-%CHANGE-POST: 7 %
FEV6-%Pred-Post: 90 %
FEV6-%Pred-Pre: 84 %
FEV6-PRE: 3.42 L
FEV6-Post: 3.68 L
FEV6FVC-%Change-Post: 0 %
FEV6FVC-%Pred-Post: 106 %
FEV6FVC-%Pred-Pre: 106 %
FVC-%CHANGE-POST: 7 %
FVC-%PRED-POST: 85 %
FVC-%Pred-Pre: 79 %
FVC-POST: 3.68 L
FVC-Pre: 3.43 L
POST FEV1/FVC RATIO: 80 %
PRE FEV6/FVC RATIO: 100 %
Post FEV6/FVC ratio: 100 %
Pre FEV1/FVC ratio: 77 %
RV % pred: 138 %
RV: 3.29 L
TLC % pred: 102 %
TLC: 7 L

## 2015-08-15 MED ORDER — IOPAMIDOL (ISOVUE-370) INJECTION 76%
75.0000 mL | Freq: Once | INTRAVENOUS | Status: AC | PRN
Start: 2015-08-15 — End: 2015-08-15
  Administered 2015-08-15: 75 mL via INTRAVENOUS

## 2015-08-15 MED ORDER — ALBUTEROL SULFATE (2.5 MG/3ML) 0.083% IN NEBU
2.5000 mg | INHALATION_SOLUTION | Freq: Once | RESPIRATORY_TRACT | Status: AC
  Administered 2015-08-15: 2.5 mg via RESPIRATORY_TRACT

## 2015-08-17 ENCOUNTER — Other Ambulatory Visit: Payer: Medicare Other

## 2015-08-24 ENCOUNTER — Other Ambulatory Visit (HOSPITAL_COMMUNITY): Payer: Self-pay | Admitting: *Deleted

## 2015-08-24 ENCOUNTER — Encounter (HOSPITAL_COMMUNITY): Payer: Self-pay | Admitting: *Deleted

## 2015-08-24 ENCOUNTER — Encounter (HOSPITAL_COMMUNITY): Payer: Self-pay

## 2015-08-24 ENCOUNTER — Ambulatory Visit (HOSPITAL_COMMUNITY)
Admission: RE | Admit: 2015-08-24 | Discharge: 2015-08-24 | Disposition: A | Discharge status: Home / Self Care | Payer: Medicare Other | Source: Ambulatory Visit | Attending: Cardiology | Admitting: Cardiology

## 2015-08-24 ENCOUNTER — Telehealth (HOSPITAL_COMMUNITY): Payer: Self-pay | Admitting: *Deleted

## 2015-08-24 VITALS — BP 140/78 | HR 94 | Ht 71.0 in | Wt 212.4 lb

## 2015-08-24 DIAGNOSIS — I5043 Acute on chronic combined systolic (congestive) and diastolic (congestive) heart failure: Secondary | ICD-10-CM | POA: Diagnosis not present

## 2015-08-24 DIAGNOSIS — I255 Ischemic cardiomyopathy: Secondary | ICD-10-CM | POA: Diagnosis not present

## 2015-08-24 DIAGNOSIS — I2510 Atherosclerotic heart disease of native coronary artery without angina pectoris: Secondary | ICD-10-CM | POA: Diagnosis not present

## 2015-08-24 DIAGNOSIS — I481 Persistent atrial fibrillation: Secondary | ICD-10-CM | POA: Insufficient documentation

## 2015-08-24 DIAGNOSIS — Z87891 Personal history of nicotine dependence: Secondary | ICD-10-CM | POA: Diagnosis not present

## 2015-08-24 DIAGNOSIS — E785 Hyperlipidemia, unspecified: Secondary | ICD-10-CM

## 2015-08-24 DIAGNOSIS — G4733 Obstructive sleep apnea (adult) (pediatric): Secondary | ICD-10-CM | POA: Diagnosis not present

## 2015-08-24 DIAGNOSIS — I110 Hypertensive heart disease with heart failure: Secondary | ICD-10-CM | POA: Diagnosis not present

## 2015-08-24 DIAGNOSIS — I4891 Unspecified atrial fibrillation: Secondary | ICD-10-CM

## 2015-08-24 DIAGNOSIS — I4819 Other persistent atrial fibrillation: Secondary | ICD-10-CM

## 2015-08-24 DIAGNOSIS — Z7902 Long term (current) use of antithrombotics/antiplatelets: Secondary | ICD-10-CM | POA: Insufficient documentation

## 2015-08-24 DIAGNOSIS — I5022 Chronic systolic (congestive) heart failure: Secondary | ICD-10-CM | POA: Diagnosis not present

## 2015-08-24 DIAGNOSIS — I5042 Chronic combined systolic (congestive) and diastolic (congestive) heart failure: Secondary | ICD-10-CM

## 2015-08-24 DIAGNOSIS — I340 Nonrheumatic mitral (valve) insufficiency: Secondary | ICD-10-CM

## 2015-08-24 DIAGNOSIS — Z79899 Other long term (current) drug therapy: Secondary | ICD-10-CM | POA: Insufficient documentation

## 2015-08-24 DIAGNOSIS — I071 Rheumatic tricuspid insufficiency: Secondary | ICD-10-CM

## 2015-08-24 DIAGNOSIS — Z951 Presence of aortocoronary bypass graft: Secondary | ICD-10-CM | POA: Diagnosis not present

## 2015-08-24 LAB — LIPID PANEL
CHOL/HDL RATIO: 2.8 ratio
Cholesterol: 109 mg/dL (ref 0–200)
HDL: 39 mg/dL — AB (ref >40)
LDL CALC: 42 mg/dL (ref 0–99)
TRIGLYCERIDES: 141 mg/dL (ref <150)
VLDL: 28 mg/dL (ref 0–40)

## 2015-08-24 LAB — BASIC METABOLIC PANEL
ANION GAP: 9 (ref 5–15)
BUN: 26 mg/dL — ABNORMAL HIGH (ref 6–20)
CALCIUM: 9.3 mg/dL (ref 8.9–10.3)
CHLORIDE: 112 mmol/L — AB (ref 101–111)
CO2: 20 mmol/L — AB (ref 22–32)
Creatinine, Ser: 1.05 mg/dL (ref 0.61–1.24)
GFR calc non Af Amer: >60 mL/min (ref >60)
GLUCOSE: 107 mg/dL — AB (ref 65–99)
Potassium: 4.4 mmol/L (ref 3.5–5.1)
Sodium: 141 mmol/L (ref 135–145)

## 2015-08-24 LAB — CBC
HEMATOCRIT: 44.7 % (ref 39.0–52.0)
HEMOGLOBIN: 14.4 g/dL (ref 13.0–17.0)
MCH: 28.6 pg (ref 26.0–34.0)
MCHC: 32.2 g/dL (ref 30.0–36.0)
MCV: 88.9 fL (ref 78.0–100.0)
Platelets: 185 10*3/uL (ref 150–400)
RBC: 5.03 MIL/uL (ref 4.22–5.81)
RDW: 15.7 % — AB (ref 11.5–15.5)
WBC: 12.5 10*3/uL — AB (ref 4.0–10.5)

## 2015-08-24 LAB — PROTIME-INR
INR: 1.28 (ref 0.00–1.49)
PROTHROMBIN TIME: 16.1 s — AB (ref 11.6–15.2)

## 2015-08-24 MED ORDER — FUROSEMIDE 40 MG PO TABS: ORAL_TABLET | ORAL | Status: DC

## 2015-08-24 MED ORDER — SPIRONOLACTONE 25 MG PO TABS: 12.5000 mg | ORAL_TABLET | Freq: Every day | ORAL | Status: DC

## 2015-08-24 MED ORDER — POTASSIUM CHLORIDE CRYS ER 10 MEQ PO TBCR: 20.0000 meq | EXTENDED_RELEASE_TABLET | Freq: Every day | ORAL | Status: DC

## 2015-08-24 NOTE — Telephone Encounter (Signed)
No pre cert reqd for right and left heart cath 1/24

## 2015-08-24 NOTE — Patient Instructions (Signed)
Increase Furosemide to 40 mg in AM and 20 mg (1/2 tab) in PM  Increase Potassium to 20 meq (2 tabs) daily  Start Spironolactone 12.5 mg (1/2 tab) daily  Labs today  Heart Catheterization 08/28/15, see instruction sheet  Your physician recommends that you schedule a follow-up appointment in: 2-3 weeks

## 2015-08-26 NOTE — Progress Notes (Signed)
Patient ID: Stanley Wells, male   DOB: 04/01/1946, 70 y.o.   MRN: [id number] PCP: Dr. Redding  70 yo with history of CAD, ischemic cardiomyopathy/chronic systolic CHF, severe functional mitral regurgitation, and persistent atrial fibrillation presents for CHF clinic evaluation.  Patient had MI then CABG in 2003.  Cardiac cath in 2015 showed only LIMA-LAD and SVG-OM patent.  In 2015, EF was 30-35%.  He had atrial flutter in 2015 and again in 11/16, both times was cardioverted.  After the 11/16 cardioversion, he stopped all his meds.  He was admitted again in 12/16 with atrial fibrillation/RVR and a CHF exacerbation.  He was diuresed and started on Eliquis with plan for DCCV after 4 weeks of anticoagulation.  He was brought back at the end of December for cardioversion, but this was unsuccessful.  TEE in 12/16 showed EF 20-25% with severe functional MR.    He was seen by Dr Owen with plan for tentative plan for mitral valve repair versus replacement and possible tricuspid valve repair along with MAZE procedure.  He was referred to CHF clinic for optimization prior to surgery.   Patient reports dyspnea when he tries to walk fast.  He can climb a flight of steps but is a little winded at the top. No orthopnea/PND. Weight stable.  He is in atrial fibrillation but does feel palpitations.  Weight is stable.  Not as active as in the past, has "slowed down."  Taking Eliquis, no BRBPR or melena.   ECG: Atrial fibrillation at 95 bpm with anterolateral TWIs.   Labs (9/16): LDL 126, HDL 38 Labs (12/16): K 4, creatinine 1.15  PMH: 1. CAD: s/p MI in 2003.  CABG x 5 in 2003 with LIMA-LAD, SVG-D1, SVG-OM2, seq SVG-PDA/PLV.  LHC/RHC 11/15 with native arteries essentially occluded.  SVG-D1 and seq SVG-PDA/PLV occluded, LIMA and SVG-OM patent.  2. Atrial flutter: DCCV 2015.  Recurred 11/16 with DCCV.   3. Chronic systolic CHF: Ischemic cardiomyopathy.  Echo (2015) with moderate-severe MR, EF 30-35%.  TEE (12/16) with  severe central MR (functional), EF 20-25% with diffuse hypokinesis, moderately decreased RV systolic function, moderate TR.  4. Mitral regurgitation: Severe, suspect functional. 5. OSA: Not using CPAP.  6. HTN 7. Nephrolithiasis 8. Atrial fibrillation: Noted in 12/16, now persistent.  9. PFTs (1/17) with minimal obstructive airways disease but severely decreased DLCO (37% predicted), possibly suggestive of pulmonary vascular disease.   SH: Prior smoker quit 2013, lives in [place], retired engineer, married.   FH: No premature CAD.  ROS: All systems reviewed and negative except as per HPI.   Current Outpatient Prescriptions  Medication Sig Dispense Refill  . apixaban (ELIQUIS) 5 MG TABS tablet Take 1 tablet (5 mg total) by mouth 2 (two) times daily. 60 tablet 11  . furosemide (LASIX) 40 MG tablet Take 1 tab in AM and 1/2 tab in PM (Patient taking differently: Take 20-40 mg by mouth 2 (two) times daily. Take 1 tablet (40 mg) by mouth every morning and 1/2 tablet (20 mg) daily with supper) 45 tablet 6  . isosorbide mononitrate (IMDUR) 30 MG 24 hr tablet Take 1 tablet (30 mg total) by mouth daily. 30 tablet 6  . lisinopril (PRINIVIL,ZESTRIL) 10 MG tablet Take 1 tablet (10 mg total) by mouth daily. 30 tablet 6  . metoprolol succinate (TOPROL-XL) 50 MG 24 hr tablet Take 1 tablet (50 mg total) by mouth 2 (two) times daily. Take with or immediately following a meal. 60 tablet 11  . Multiple   Vitamin (MULTIVITAMIN WITH MINERALS) TABS tablet Take 1 tablet by mouth daily.    . potassium chloride (K-DUR,KLOR-CON) 10 MEQ tablet Take 2 tablets (20 mEq total) by mouth daily. 60 tablet 6  . rosuvastatin (CRESTOR) 40 MG tablet Take 1 tablet (40 mg total) by mouth every evening. 30 tablet 3  . spironolactone (ALDACTONE) 25 MG tablet Take 0.5 tablets (12.5 mg total) by mouth daily. 15 tablet 3  . [DISCONTINUED] amLODipine (NORVASC) 5 MG tablet Take 1 tablet (5 mg total) by mouth daily. 30 tablet 6   No  current facility-administered medications for this encounter.   BP 140/78 mmHg  Pulse 94  Ht 5' 11" (1.803 m)  Wt 212 lb 6.4 oz (96.344 kg)  BMI 29.64 kg/m2  SpO2 98% General: NAD Neck: JVP 8-9 cm, no thyromegaly or thyroid nodule.  Lungs: Decreased breath sounds bilaterally.  CV: Nondisplaced PMI.  Heart sounds quiet, regular S1/S2, no S3/S4, 1/6 HSM apex.  No peripheral edema.  No carotid bruit.  Normal pedal pulses.  Abdomen: Soft, nontender, no hepatosplenomegaly, no distention.  Skin: Intact without lesions or rashes.  Neurologic: Alert and oriented x 3.  Psych: Normal affect. Extremities: No clubbing or cyanosis.  HEENT: Normal.   Assessment/Plan: 1. Chronic systolic CHF: EF 20-25% on TEE from 12/16.  Ischemic cardiomyopathy.  He remains volume overloaded on exam with NYHA class II-III symptoms.   - I will have him increase Lasix to 40 qam/20 qpm.  Increase KCl to 20 daily.  - Add spironolactone 12.5 mg daily.  - Continue Toprol XL and lisinopril.  In future, would consider transition to Entresto.  - BMET/BNP today.  - Will follow with repeat echo after surgery. If EF remains low, would be ICD candidate.  Narrow QRS so no CRT.  - Will get RHC as part of pre-operative workup.  2. CAD: s/p CABG.  On last cath in 2015, only LIMA-LAD and SVG-OM2 still patent.   - He will need coronary angiography prior to MV surgery.  I will arrange this.  I discussed RHC/LHC with the patient and he understands the risks and benefits.  He agrees to the procedure.  - No ASA given use of apixaban.  - Continue Crestor, check lipids today.  3. Atrial fibrillation: Persistent.  Failed DCCV last month.  Rate controlled with Toprol XL 50 mg bid and anticoagulated with apixaban.  MAZE procedure is planned with his mitral valve operation.  4. Mitral regurgitation: Severe, probably functional.   Currently being evaluated for MV repair or replacement.  5. Tricuspid regurgitation: Moderate.  May be repaired at  time of surgery.   Stanley Wells 08/26/2015  

## 2015-08-28 ENCOUNTER — Encounter (HOSPITAL_COMMUNITY)
Admission: RE | Disposition: A | Discharge status: Home / Self Care | Payer: Self-pay | Source: Ambulatory Visit | Attending: Cardiovascular Disease

## 2015-08-28 ENCOUNTER — Ambulatory Visit (HOSPITAL_COMMUNITY)
Admission: RE | Admit: 2015-08-28 | Discharge: 2015-08-28 | Disposition: A | Discharge status: Home / Self Care | Payer: Medicare Other | Source: Ambulatory Visit | Attending: Cardiovascular Disease | Admitting: Cardiovascular Disease

## 2015-08-28 DIAGNOSIS — I481 Persistent atrial fibrillation: Secondary | ICD-10-CM | POA: Insufficient documentation

## 2015-08-28 DIAGNOSIS — I252 Old myocardial infarction: Secondary | ICD-10-CM | POA: Diagnosis not present

## 2015-08-28 DIAGNOSIS — I110 Hypertensive heart disease with heart failure: Secondary | ICD-10-CM | POA: Diagnosis not present

## 2015-08-28 DIAGNOSIS — I2582 Chronic total occlusion of coronary artery: Secondary | ICD-10-CM | POA: Diagnosis not present

## 2015-08-28 DIAGNOSIS — Z87442 Personal history of urinary calculi: Secondary | ICD-10-CM | POA: Insufficient documentation

## 2015-08-28 DIAGNOSIS — Z7901 Long term (current) use of anticoagulants: Secondary | ICD-10-CM | POA: Diagnosis not present

## 2015-08-28 DIAGNOSIS — I5022 Chronic systolic (congestive) heart failure: Secondary | ICD-10-CM | POA: Insufficient documentation

## 2015-08-28 DIAGNOSIS — I340 Nonrheumatic mitral (valve) insufficiency: Secondary | ICD-10-CM | POA: Insufficient documentation

## 2015-08-28 DIAGNOSIS — Z87891 Personal history of nicotine dependence: Secondary | ICD-10-CM | POA: Insufficient documentation

## 2015-08-28 DIAGNOSIS — I255 Ischemic cardiomyopathy: Secondary | ICD-10-CM | POA: Insufficient documentation

## 2015-08-28 DIAGNOSIS — I25810 Atherosclerosis of coronary artery bypass graft(s) without angina pectoris: Secondary | ICD-10-CM | POA: Insufficient documentation

## 2015-08-28 DIAGNOSIS — I429 Cardiomyopathy, unspecified: Secondary | ICD-10-CM | POA: Diagnosis not present

## 2015-08-28 DIAGNOSIS — G4733 Obstructive sleep apnea (adult) (pediatric): Secondary | ICD-10-CM | POA: Diagnosis not present

## 2015-08-28 DIAGNOSIS — I2510 Atherosclerotic heart disease of native coronary artery without angina pectoris: Secondary | ICD-10-CM | POA: Diagnosis not present

## 2015-08-28 HISTORY — PX: CARDIAC CATHETERIZATION: SHX172

## 2015-08-28 LAB — BASIC METABOLIC PANEL
Anion gap: 9 (ref 5–15)
BUN: 24 mg/dL — AB (ref 6–20)
CHLORIDE: 105 mmol/L (ref 101–111)
CO2: 26 mmol/L (ref 22–32)
CREATININE: 1.09 mg/dL (ref 0.61–1.24)
Calcium: 9.5 mg/dL (ref 8.9–10.3)
GFR calc Af Amer: >60 mL/min (ref >60)
GFR calc non Af Amer: >60 mL/min (ref >60)
Glucose, Bld: 109 mg/dL — ABNORMAL HIGH (ref 65–99)
Potassium: 4.4 mmol/L (ref 3.5–5.1)
SODIUM: 140 mmol/L (ref 135–145)

## 2015-08-28 LAB — POCT I-STAT 3, VENOUS BLOOD GAS (G3P V)
ACID-BASE EXCESS: 1 mmol/L (ref 0.0–2.0)
Bicarbonate: 26.1 mEq/L — ABNORMAL HIGH (ref 20.0–24.0)
O2 SAT: 57 %
PCO2 VEN: 42.4 mmHg — AB (ref 45.0–50.0)
PO2 VEN: 30 mmHg (ref 30.0–45.0)
TCO2: 27 mmol/L (ref 0–100)
pH, Ven: 7.397 — ABNORMAL HIGH (ref 7.250–7.300)

## 2015-08-28 SURGERY — RIGHT HEART CATH AND CORONARY/GRAFT ANGIOGRAPHY
Anesthesia: LOCAL

## 2015-08-28 MED ORDER — LIDOCAINE HCL (PF) 1 % IJ SOLN
INTRAMUSCULAR | Status: DC | PRN
  Administered 2015-08-28: 11:00:00

## 2015-08-28 MED ORDER — SODIUM CHLORIDE 0.9 % IJ SOLN: 3.0000 mL | Freq: Two times a day (BID) | INTRAMUSCULAR | Status: DC

## 2015-08-28 MED ORDER — SODIUM CHLORIDE 0.9 % IJ SOLN: 3.0000 mL | INTRAMUSCULAR | Status: DC | PRN

## 2015-08-28 MED ORDER — SODIUM CHLORIDE 0.9 % IV SOLN: 250.0000 mL | INTRAVENOUS | Status: DC | PRN

## 2015-08-28 MED ORDER — LIDOCAINE HCL (PF) 1 % IJ SOLN
INTRAMUSCULAR | Status: AC
  Filled 2015-08-28: qty 30

## 2015-08-28 MED ORDER — MIDAZOLAM HCL 2 MG/2ML IJ SOLN
INTRAMUSCULAR | Status: DC | PRN
  Administered 2015-08-28 (×2): 1 mg via INTRAVENOUS

## 2015-08-28 MED ORDER — HEPARIN (PORCINE) IN NACL 2-0.9 UNIT/ML-% IJ SOLN
INTRAMUSCULAR | Status: AC
  Filled 2015-08-28: qty 1000

## 2015-08-28 MED ORDER — MIDAZOLAM HCL 2 MG/2ML IJ SOLN
INTRAMUSCULAR | Status: AC
  Filled 2015-08-28: qty 2

## 2015-08-28 MED ORDER — FENTANYL CITRATE (PF) 100 MCG/2ML IJ SOLN
INTRAMUSCULAR | Status: AC
  Filled 2015-08-28: qty 2

## 2015-08-28 MED ORDER — IOHEXOL 350 MG/ML SOLN
INTRAVENOUS | Status: DC | PRN
Start: 2015-08-28 — End: 2015-08-28
  Administered 2015-08-28: 80 mL via INTRAVENOUS

## 2015-08-28 MED ORDER — SODIUM CHLORIDE 0.9 % IV SOLN
INTRAVENOUS | Status: DC
  Administered 2015-08-28: 08:00:00 via INTRAVENOUS

## 2015-08-28 MED ORDER — ASPIRIN 81 MG PO CHEW
CHEWABLE_TABLET | ORAL | Status: AC
  Filled 2015-08-28: qty 1

## 2015-08-28 MED ORDER — ASPIRIN 81 MG PO CHEW
81.0000 mg | CHEWABLE_TABLET | ORAL | Status: AC
  Administered 2015-08-28: 81 mg via ORAL

## 2015-08-28 MED ORDER — SODIUM CHLORIDE 0.9 % WEIGHT BASED INFUSION: 1.0000 mL/kg/h | INTRAVENOUS | Status: AC

## 2015-08-28 MED ORDER — FENTANYL CITRATE (PF) 100 MCG/2ML IJ SOLN
INTRAMUSCULAR | Status: DC | PRN
  Administered 2015-08-28 (×2): 25 ug via INTRAVENOUS

## 2015-08-28 MED ORDER — ACETAMINOPHEN 325 MG PO TABS: 650.0000 mg | ORAL_TABLET | ORAL | Status: DC | PRN

## 2015-08-28 MED ORDER — ONDANSETRON HCL 4 MG/2ML IJ SOLN: 4.0000 mg | Freq: Four times a day (QID) | INTRAMUSCULAR | Status: DC | PRN

## 2015-08-28 SURGICAL SUPPLY — 14 items
CATH INFINITI 5 FR IM (CATHETERS) ×2 IMPLANT
CATH INFINITI 5 FR MPA2 (CATHETERS) ×2 IMPLANT
CATH INFINITI 5FR JL5 (CATHETERS) ×2 IMPLANT
CATH INFINITI 5FR MULTPACK ANG (CATHETERS) ×2 IMPLANT
CATH SWAN GANZ 7F STRAIGHT (CATHETERS) IMPLANT
KIT HEART LEFT (KITS) ×2 IMPLANT
PACK CARDIAC CATHETERIZATION (CUSTOM PROCEDURE TRAY) ×2 IMPLANT
SHEATH PINNACLE 5F 10CM (SHEATH) IMPLANT
SHEATH PINNACLE 7F 10CM (SHEATH) IMPLANT
SYR MEDRAD MARK V 150ML (SYRINGE) ×2 IMPLANT
TRANSDUCER W/STOPCOCK (MISCELLANEOUS) ×4 IMPLANT
TUBING ART PRESS 72  MALE/FEM (TUBING) ×1
TUBING ART PRESS 72 MALE/FEM (TUBING) ×1 IMPLANT
WIRE EMERALD 3MM-J .035X150CM (WIRE) IMPLANT

## 2015-08-28 NOTE — H&P (View-Only) (Signed)
Patient ID: Stanley Wells, male   DOB: 16-Jan-1946, 70 y.o.   MRN: [id number] PCP: Dr. Jeanie Sewer  70 yo with history of CAD, ischemic cardiomyopathy/chronic systolic CHF, severe functional mitral regurgitation, and persistent atrial fibrillation presents for CHF clinic evaluation.  Patient had MI then CABG in 2003.  Cardiac cath in 2015 showed only LIMA-LAD and SVG-OM patent.  In 2015, EF was 30-35%.  He had atrial flutter in 2015 and again in 11/16, both times was cardioverted.  After the 11/16 cardioversion, he stopped all his meds.  He was admitted again in 12/16 with atrial fibrillation/RVR and a CHF exacerbation.  He was diuresed and started on Eliquis with plan for DCCV after 4 weeks of anticoagulation.  He was brought back at the end of December for cardioversion, but this was unsuccessful.  TEE in 12/16 showed EF 20-25% with severe functional MR.    He was seen by Dr [name] with plan for tentative plan for mitral valve repair versus replacement and possible tricuspid valve repair along with MAZE procedure.  He was referred to CHF clinic for optimization prior to surgery.   Patient reports dyspnea when he tries to walk fast.  He can climb a flight of steps but is a little winded at the top. No orthopnea/PND. Weight stable.  He is in atrial fibrillation but does feel palpitations.  Weight is stable.  Not as active as in the past, has "slowed down."  Taking Eliquis, no BRBPR or melena.   ECG: Atrial fibrillation at 95 bpm with anterolateral TWIs.   Labs (9/16): LDL 126, HDL 38 Labs (12/16): K 4, creatinine 1.15  PMH: 1. CAD: s/p MI in 2003.  CABG x 5 in 2003 with LIMA-LAD, SVG-D1, SVG-OM2, seq SVG-PDA/PLV.  LHC/RHC 11/15 with native arteries essentially occluded.  SVG-D1 and seq SVG-PDA/PLV occluded, LIMA and SVG-OM patent.  2. Atrial flutter: DCCV 2015.  Recurred 11/16 with DCCV.   3. Chronic systolic CHF: Ischemic cardiomyopathy.  Echo (2015) with moderate-severe MR, EF 30-35%.  TEE (12/16) with  severe central MR (functional), EF 20-25% with diffuse hypokinesis, moderately decreased RV systolic function, moderate TR.  4. Mitral regurgitation: Severe, suspect functional. 5. OSA: Not using CPAP.  6. HTN 7. Nephrolithiasis 8. Atrial fibrillation: Noted in 12/16, now persistent.  9. PFTs (1/17) with minimal obstructive airways disease but severely decreased DLCO (37% predicted), possibly suggestive of pulmonary vascular disease.   SH: Prior smoker quit 2013, lives in [place], retired Art gallery manager, married.   FH: No premature CAD.  ROS: All systems reviewed and negative except as per HPI.   Current Outpatient Prescriptions  Medication Sig Dispense Refill  . apixaban (ELIQUIS) 5 MG TABS tablet Take 1 tablet (5 mg total) by mouth 2 (two) times daily. 60 tablet 11  . furosemide (LASIX) 40 MG tablet Take 1 tab in AM and 1/2 tab in PM (Patient taking differently: Take 20-40 mg by mouth 2 (two) times daily. Take 1 tablet (40 mg) by mouth every morning and 1/2 tablet (20 mg) daily with supper) 45 tablet 6  . isosorbide mononitrate (IMDUR) 30 MG 24 hr tablet Take 1 tablet (30 mg total) by mouth daily. 30 tablet 6  . lisinopril (PRINIVIL,ZESTRIL) 10 MG tablet Take 1 tablet (10 mg total) by mouth daily. 30 tablet 6  . metoprolol succinate (TOPROL-XL) 50 MG 24 hr tablet Take 1 tablet (50 mg total) by mouth 2 (two) times daily. Take with or immediately following a meal. 60 tablet 11  . Multiple  Vitamin (MULTIVITAMIN WITH MINERALS) TABS tablet Take 1 tablet by mouth daily.    . potassium chloride (K-DUR,KLOR-CON) 10 MEQ tablet Take 2 tablets (20 mEq total) by mouth daily. 60 tablet 6  . rosuvastatin (CRESTOR) 40 MG tablet Take 1 tablet (40 mg total) by mouth every evening. 30 tablet 3  . spironolactone (ALDACTONE) 25 MG tablet Take 0.5 tablets (12.5 mg total) by mouth daily. 15 tablet 3  . [DISCONTINUED] amLODipine (NORVASC) 5 MG tablet Take 1 tablet (5 mg total) by mouth daily. 30 tablet 6   No  current facility-administered medications for this encounter.   BP 140/78 mmHg  Pulse 94  Ht  (1.803 m)  Wt 212 lb 6.4 oz (96.344 kg)  BMI 29.64 kg/m2  SpO2 98% General: NAD Neck: JVP 8-9 cm, no thyromegaly or thyroid nodule.  Lungs: Decreased breath sounds bilaterally.  CV: Nondisplaced PMI.  Heart sounds quiet, regular S1/S2, no S3/S4, 1/6 HSM apex.  No peripheral edema.  No carotid bruit.  Normal pedal pulses.  Abdomen: Soft, nontender, no hepatosplenomegaly, no distention.  Skin: Intact without lesions or rashes.  Neurologic: Alert and oriented x 3.  Psych: Normal affect. Extremities: No clubbing or cyanosis.  HEENT: Normal.   Assessment/Plan: 1. Chronic systolic CHF: EF 32-44% on TEE from 12/16.  Ischemic cardiomyopathy.  He remains volume overloaded on exam with NYHA class II-III symptoms.   - I will have him increase Lasix to 40 qam/20 qpm.  Increase KCl to 20 daily.  - Add spironolactone 12.5 mg daily.  - Continue Toprol XL and lisinopril.  In future, would consider transition to [facility].  - BMET/BNP today.  - Will follow with repeat echo after surgery. If EF remains low, would be ICD candidate.  Narrow QRS so no CRT.  - Will get RHC as part of pre-operative workup.  2. CAD: s/p CABG.  On last cath in 2015, only LIMA-LAD and SVG-OM2 still patent.   - He will need coronary angiography prior to MV surgery.  I will arrange this.  I discussed RHC/LHC with the patient and he understands the risks and benefits.  He agrees to the procedure.  - No ASA given use of apixaban.  - Continue Crestor, check lipids today.  3. Atrial fibrillation: Persistent.  Failed DCCV last month.  Rate controlled with Toprol XL 50 mg bid and anticoagulated with apixaban.  MAZE procedure is planned with his mitral valve operation.  4. Mitral regurgitation: Severe, probably functional.   Currently being evaluated for MV repair or replacement.  5. Tricuspid regurgitation: Moderate.  May be repaired at  time of surgery.   Marca Ancona 08/26/2015

## 2015-08-28 NOTE — Progress Notes (Signed)
Site area: rfa/rfv Site Prior to Removal:  Level 0 Pressure Applied For:12min Manual:   yes Patient Status During Pull: stable  Post Pull Site:  Level 0 Post Pull Instructions Given:yes   Post Pull Pulses Present:palpable  Dressing Applied:  clear Bedrest begins @ 1125 till 1525 Comments:

## 2015-08-28 NOTE — Discharge Instructions (Signed)
Angiogram, Care After °Refer to this sheet in the next few weeks. These instructions provide you with information about caring for yourself after your procedure. Your health care provider may also give you more specific instructions. Your treatment has been planned according to current medical practices, but problems sometimes occur. Call your health care provider if you have any problems or questions after your procedure. °WHAT TO EXPECT AFTER THE PROCEDURE °After your procedure, it is typical to have the following: °· Bruising at the catheter insertion site that usually fades within 1-2 weeks. °· Blood collecting in the tissue (hematoma) that may be painful to the touch. It should usually decrease in size and tenderness within 1-2 weeks. °HOME CARE INSTRUCTIONS °· Take medicines only as directed by your health care provider. °· You may shower 24-48 hours after the procedure or as directed by your health care provider. Remove the bandage (dressing) and gently wash the site with plain soap and water. Pat the area dry with a clean towel. Do not rub the site, because this may cause bleeding. °· Do not take baths, swim, or use a hot tub until your health care provider approves. °· Check your insertion site every day for redness, swelling, or drainage. °· Do not apply powder or lotion to the site. °· Do not lift over 10 lb (4.5 kg) for 5 days after your procedure or as directed by your health care provider. °· Ask your health care provider when it is okay to: °¨ Return to work or school. °¨ Resume usual physical activities or sports. °¨ Resume sexual activity. °· Do not drive home if you are discharged the same day as the procedure. Have someone else drive you. °· You may drive 24 hours after the procedure unless otherwise instructed by your health care provider. °· Do not operate machinery or power tools for 24 hours after the procedure or as directed by your health care provider. °· If your procedure was done as an  outpatient procedure, which means that you went home the same day as your procedure, a responsible adult should be with you for the first 24 hours after you arrive home. °· Keep all follow-up visits as directed by your health care provider. This is important. °SEEK MEDICAL CARE IF: °· You have a fever. °· You have chills. °· You have increased bleeding from the catheter insertion site. Hold pressure on the site. °SEEK IMMEDIATE MEDICAL CARE IF: °· You have unusual pain at the catheter insertion site. °· You have redness, warmth, or swelling at the catheter insertion site. °· You have drainage (other than a small amount of blood on the dressing) from the catheter insertion site. °· The catheter insertion site is bleeding, and the bleeding does not stop after 30 minutes of holding steady pressure on the site. °· The area near or just beyond the catheter insertion site becomes pale, cool, tingly, or numb. °  °This information is not intended to replace advice given to you by your health care provider. Make sure you discuss any questions you have with your health care provider. °  °Document Released: 02/06/2005 Document Revised: 08/11/2014 Document Reviewed: 12/22/2012 °Elsevier Interactive Patient Education ©2016 [employer] ° °

## 2015-08-28 NOTE — Interval H&P Note (Signed)
History and Physical Interval Note:  08/28/2015 9:44 AM  Stanley Wells  has presented today for surgery, with the diagnosis of hf  The various methods of treatment have been discussed with the patient and family. After consideration of risks, benefits and other options for treatment, the patient has consented to  Procedure(s): Right Heart Cath and Coronary/Graft Angiography (N/A) as a surgical intervention .  The patient's history has been reviewed, patient examined, no change in status, stable for surgery.  I have reviewed the patient's chart and labs.  Questions were answered to the patient's satisfaction.     Stanley Wells [employer]

## 2015-08-29 ENCOUNTER — Encounter (HOSPITAL_COMMUNITY): Payer: Self-pay | Admitting: Cardiology

## 2015-08-29 ENCOUNTER — Encounter (HOSPITAL_COMMUNITY): Payer: Self-pay | Admitting: *Deleted

## 2015-09-03 ENCOUNTER — Encounter: Payer: Self-pay | Admitting: Thoracic Surgery (Cardiothoracic Vascular Surgery)

## 2015-09-03 ENCOUNTER — Ambulatory Visit (INDEPENDENT_AMBULATORY_CARE_PROVIDER_SITE_OTHER): Payer: Medicare Other | Admitting: Thoracic Surgery (Cardiothoracic Vascular Surgery)

## 2015-09-03 ENCOUNTER — Other Ambulatory Visit: Payer: Self-pay | Admitting: Thoracic Surgery (Cardiothoracic Vascular Surgery)

## 2015-09-03 VITALS — BP 134/86 | HR 90 | Resp 20 | Ht 71.0 in | Wt 205.0 lb

## 2015-09-03 DIAGNOSIS — Z951 Presence of aortocoronary bypass graft: Secondary | ICD-10-CM

## 2015-09-03 DIAGNOSIS — I071 Rheumatic tricuspid insufficiency: Secondary | ICD-10-CM

## 2015-09-03 DIAGNOSIS — I481 Persistent atrial fibrillation: Secondary | ICD-10-CM | POA: Diagnosis not present

## 2015-09-03 DIAGNOSIS — I5042 Chronic combined systolic (congestive) and diastolic (congestive) heart failure: Secondary | ICD-10-CM

## 2015-09-03 DIAGNOSIS — I340 Nonrheumatic mitral (valve) insufficiency: Secondary | ICD-10-CM

## 2015-09-03 DIAGNOSIS — I4819 Other persistent atrial fibrillation: Secondary | ICD-10-CM

## 2015-09-03 DIAGNOSIS — I255 Ischemic cardiomyopathy: Secondary | ICD-10-CM | POA: Diagnosis not present

## 2015-09-03 NOTE — Progress Notes (Signed)
[address]       [place] [address]             [phone]     CARDIOTHORACIC SURGERY OFFICE NOTE  Referring Provider is Duke Salvia, MD  Primary Cardiologist is Lennette Bihari, MD PCP is [facility] Valrie Hart., MD   HPI:  Patient returns to the office today for follow-up of mitral regurgitation, coronary artery disease with ischemic cardiomyopathy, recurrent persistent atrial fibrillation, and chronic combined systolic and diastolic congestive heart failure.  He was initially seen in consultation on 08/09/2015.  Since then he has been evaluated in the advanced heart failure clinic and he underwent left and right heart catheterization by Dr. Shirlee Latch on 08/28/2015. Coronary arteriography appeared similar to the patient's last previous catheterization performed in 2015. Specifically, the patient had continued patency of left internal mammary artery to distal left anterior descending coronary artery and saphenous vein graft to the second obtuse marginal branch of the left circumflex coronary artery. All of the native coronary arteries are chronically occluded and the vein grafts previously placed to the diagonal branch into the right coronary system also remain chronically occluded. The patient had remarkably normal right sided filling pressures. Ejection fraction was estimated 25% with moderate to severe (3+) mitral regurgitation. The patient has also undergone pulmonary function testing and a CT angiogram, and he returns to our office today to discuss treatment options further.  The patient continues to describe stable symptoms of exertional shortness of breath consistent with chronic combined systolic and diastolic congestive heart failure, [place] Heart Association functional class II-III.  He states that he is comfortable with ordinary activity but he gets short of breath if he goes up a flight of stairs, walks at a brisk pace, or tries to do significant chores around  the house. This is limiting his daily activities to some degree. He has not had resting shortness of breath, or orthopnea.  He has had some PND and lower extremity edema in the past but not recently.   Current Outpatient Prescriptions  Medication Sig Dispense Refill  . DIGITEK 125 MCG tablet Take 0.125 mg by mouth daily.   0  . ELIQUIS 5 MG TABS tablet Take 5 mg by mouth 2 (two) times daily.  1  . furosemide (LASIX) 40 MG tablet TAKE 1 TABLET BY MOUTH IN THE MORNING AND 1/2 TABLET IN THE EVENING  0  . isosorbide mononitrate (IMDUR) 30 MG 24 hr tablet   0  . lisinopril (PRINIVIL,ZESTRIL) 10 MG tablet   0  . metoprolol succinate (TOPROL-XL) 50 MG 24 hr tablet   1  . potassium chloride (K-DUR,KLOR-CON) 10 MEQ tablet Take 20 mEq by mouth daily.  0  . rosuvastatin (CRESTOR) 40 MG tablet   0  . spironolactone (ALDACTONE) 25 MG tablet take 1/2 OF A tablet by mouth once daily  0  . [DISCONTINUED] amLODipine (NORVASC) 5 MG tablet Take 1 tablet (5 mg total) by mouth daily. 30 tablet 6   No current facility-administered medications for this visit.      Physical Exam:   BP 134/86 mmHg  Pulse 90  Resp 20  Ht  (1.803 m)  Wt 205 lb (92.987 kg)  BMI 28.60 kg/m2  SpO2 98%  General:  Well-appearing  Chest:   Clear to auscultation  CV:   Irregular rate and rhythm  Incisions:  n/a  Abdomen:  Soft and nontender  Extremities:  Warm and well-perfused  Diagnostic Tests:  CARDIAC CATHETERIZATION Procedures    Right Heart Cath and Coronary/Graft Angiography    Conclusion    1. Left ventriculogram showed EF about 25% with 3+ MR. Not well-visualized as LV was large and filled poorly.  2. Normal left and right heart filling pressures. However, cardiac output low by Fick and thermodilution.  3. Coronaries and grafts similar to prior study in 2015. Patent LIMA-LAD and SVG-OM1. Other two grafts occluded. No source of flow to RCA territory other than very weak collaterals from LAD.  Possible compromise to flow to superior division of large OM1 as the SVG touches down on the inferior division of OM1 which has 70-80% proximal stenosis.   He is clinically stable. I am going to start him on digoxin 0.125 daily. He will restart Eliquis tomorrow morning.     Technique and Indications    Procedure: Right Heart Cath, Left Heart Cath, Selective Coronary Angiography, SVG angiography, LIMA angiography, LV angiography  Indication: Severe MR, cardiomyopathy, atrial fibrillation. Plan for MV repair/replacement and MAZE.   Procedural Details: The right groin was prepped, draped, and anesthetized with 1% lidocaine. Using the modified Seldinger technique a 5 French sheath was placed in the right femoral artery and a 7 French sheath was placed in the right femoral vein. A Swan-Ganz catheter was used for the right heart catheterization. Standard protocol was followed for recording of right heart pressures and sampling of oxygen saturations. Fick and thermodilution cardiac output was calculated. Standard Judkins catheters were used for selective coronary angiography and left ventriculography. There were no immediate procedural complications. The patient was transferred to the post catheterization recovery area for further monitoring.Estimated blood loss <50 mL.    Coronary Findings    Dominance: Right   Left Main  30-40% distal LM stenosis.     Left Anterior Descending  Totally occluded proximal LAD. Total occlusion of SVG-diagonal. Patent LIMA-large LAD that gives collaterals to LCx territory and weak collaterals to RCA territory. There is a small-moderate D1 that fills via collaterals.     Left Circumflex  Small to moderate ramus without significant disease. Moderate caliber AV LCx with proximal 70-80% stenosis. Total occlusion ostially of a large, branching OM1. Patent SVG-OM1. SVG touches down on the inferior branch of OM1. This backfills to the superior branch of OM1. However,  there are serial 70-80% stenoses in the proximal inferior OM1 branch that may compromise backflow to the superior OM1 branch.     Right Coronary Artery  Total occlusion proximally. Occlusion of the sequential SVG-PDA and PLV.       Right Heart Pressures RHC Procedural Findings: Hemodynamics (mmHg) RA mean 5 RV 23/3 PA 27/12, mean 18 PCWP mean 10 LV 107/10 AO 111/74  Oxygen saturations: PA 57% AO 98%  Cardiac Output (Fick) 3.53  Cardiac Index (Fick) 1.7  Cardiac Output (Thermo) 3.9 Cardiac Index (Thermo) 1.83     CT ANGIOGRAPHY CHEST, ABDOMEN AND PELVIS  TECHNIQUE: Multidetector CT imaging through the chest, abdomen and pelvis was performed using the standard protocol during bolus administration of intravenous contrast. Multiplanar reconstructed images and MIPs were obtained and reviewed to evaluate the vascular anatomy.  CONTRAST: 75 cc Isovue  COMPARISON: CT thorax 05/16/2004  FINDINGS: CTA CHEST FINDINGS  Mediastinum/Nodes: Patient status post CABG. LEFT LIMA to LAD. Vein graft to circumflex.  The ascending aorta measures 3.4 cm at the level of the bi bypass graft markers. The descending thoracic aorta measures 3.1 cm.  No pericardial fluid. No mediastinal or hilar  adenopathy. Esophagus normal.  Lungs/Pleura: No pulmonary nodularity. No airspace disease or pleural fluid  Review of the MIP images confirms the above findings.  CTA ABDOMEN AND PELVIS FINDINGS  Hepatobiliary: Low-density lesion in the RIGHT hepatic lobe consistent benign cysts. No biliary duct dilatation. Gallbladder normal.  Pancreas: Pancreas is normal. No ductal dilatation. No pancreatic inflammation.  Spleen: Normal spleen  Adrenals/urinary tract: Enlargement of the LEFT adrenal gland. This lesions has low attenuation consistent with benign adenoma on comparison exam. Mild bilateral renal cortical thinning. No hydronephrosis. Ureters bladder  normal.  Stomach/Bowel: Stomach, small bowel, appendix, and cecum are normal. The colon and rectosigmoid colon are normal.  Vascular/Lymphatic: Abdominal aorta is normal caliber. There is extensive intimal calcification. The celiac trunk and SMA are widely patent. Bilateral single renal arteries noted. The iliac arteries are non aneurysmal.  Heavy intimal calcification of the iliac arteries. No significant stenosis. There is a focus of eccentric plaque in the proximal LEFT femoral artery (image 261, series 4). Second focus in the eccentric plaque in the RIGHT external iliac artery on image 225, series 4.  Reproductive: Normal prostate  Other: No free fluid.  Musculoskeletal: No aggressive osseous lesion.  Review of the MIP images confirms the above findings.  IMPRESSION: Chest Impression:  1. Post CABG anatomy with vein grafts and LIMA to LAD. 2. Thoracic aorta appears normal with some intimal calcification.  Abdomen / Pelvis Impression:  1. Interval calcification of the abdominal aorta. No aneurysm or dissection. 2. Patent branches of the aorta as above. 3. Iliac arteries are heavily calcified however there is no significant stenosis identified. Several foci of eccentric plaque are noted in the proximal LEFT femoral artery and RIGHT external iliac artery. 4. Bilateral renal cortical thinning.   Electronically Signed  By: Genevive Bi M.D.  On: 08/15/2015 16:15    Pulmonary Function Tests  Baseline      Post-bronchodilator  FVC  3.43 L  (79% predicted) FVC  3.68 L  (85% predicted) FEV1  2.63 L  (82% predicted) FEV1  2.94 L  (92% predicted) FEF25-75 2.14 L  (88% predicted) FEF25-75 3.49 L  (143% predicted)  TLC  7.00 L  (102% predicted) RV  3.29 L  (138% predicted) DLCO  37% predicted      Impression:  Patient has long-standing coronary artery disease status post coronary artery bypass grafting in 2003, ischemic cardiomyopathy,  ischemic mitral regurgitation, and chronic combined systolic and diastolic congestive heart failure that has gotten worse clinically in the setting of recurrent persistent atrial fibrillation. I have personally reviewed the patient's recent diagnostic cardiac catheterization and compared it with the catheterization performed in 2015. Overall there has been no significant change.  There is severe native artery artery disease with chronic occlusion of all of the main branches in all 3 major vascular territories. There remains continued patency of the left internal mammary artery to the distal left anterior second coronary artery and saphenous vein graft to the second obtuse marginal branch of the left circumflex system.  There are no signs of a reasonable target vessel for redo coronary artery bypass grafting in the distal right coronary circulation. Right heart pressures were remarkably normal at the time of the patient's recent catheterization. The patient has classical type IIIB dysfunction of the mitral valve with at least moderate and probably severe regurgitation. Flow reversal was noted in the pulmonary veins at the time of the patient's most recent transesophageal echocardiogram.  Pulmonary function testing demonstrates the presence of only mild  COPD and the patient quit smoking in 2003. The patient does have moderately reduced diffusion capacity at baseline. CT angiography reveals mild atherosclerotic disease of the aorta and great vessels without any significant complicating features.   Plan:  I have discussed options at length with the patient and his wife in the office today. We discussed long-term prognosis with continued medical therapy and close follow-up. We discussed risks associated with mitral valve repair or replacement and Maze procedure. The patient remains interested in considering surgical intervention despite the associated risks. We will obtain MRI viability study to further characterize  the degree of left ventricular systolic dysfunction and whether or not there are areas of reversible ischemia that might benefit from redo surgical revascularization. Based upon review of the patient's diagnostic catheterization I am currently skeptical that redo CABG would be worth the considerable associated risk. Alternatively, mitral valve repair or replacement with Maze procedure could potentially be performed via right mini thoracotomy approach. The patient will return in 2 weeks to review the results of his MRI and discuss treatment options further.  I spent in excess of 15 minutes during the conduct of this office consultation and >50% of this time involved direct face-to-face encounter with the patient for counseling and/or coordination of their care.    Salvatore Decent. [name], MD 09/03/2015 3:10 PM

## 2015-09-03 NOTE — Patient Instructions (Signed)
Continue all previous medications without any changes at this time  

## 2015-09-12 ENCOUNTER — Encounter: Payer: Self-pay | Admitting: Thoracic Surgery (Cardiothoracic Vascular Surgery)

## 2015-09-14 ENCOUNTER — Ambulatory Visit (HOSPITAL_COMMUNITY)
Admission: RE | Admit: 2015-09-14 | Discharge: 2015-09-14 | Disposition: A | Discharge status: Home / Self Care | Payer: Medicare Other | Source: Ambulatory Visit | Attending: Cardiology | Admitting: Cardiology

## 2015-09-14 ENCOUNTER — Encounter (HOSPITAL_COMMUNITY): Payer: Self-pay

## 2015-09-14 VITALS — BP 124/71 | HR 70 | Resp 18 | Wt 209.0 lb

## 2015-09-14 DIAGNOSIS — Z79899 Other long term (current) drug therapy: Secondary | ICD-10-CM | POA: Insufficient documentation

## 2015-09-14 DIAGNOSIS — I255 Ischemic cardiomyopathy: Secondary | ICD-10-CM | POA: Insufficient documentation

## 2015-09-14 DIAGNOSIS — I5042 Chronic combined systolic (congestive) and diastolic (congestive) heart failure: Secondary | ICD-10-CM | POA: Diagnosis not present

## 2015-09-14 DIAGNOSIS — G4733 Obstructive sleep apnea (adult) (pediatric): Secondary | ICD-10-CM | POA: Insufficient documentation

## 2015-09-14 DIAGNOSIS — I482 Chronic atrial fibrillation, unspecified: Secondary | ICD-10-CM

## 2015-09-14 DIAGNOSIS — Z87891 Personal history of nicotine dependence: Secondary | ICD-10-CM | POA: Insufficient documentation

## 2015-09-14 DIAGNOSIS — I2510 Atherosclerotic heart disease of native coronary artery without angina pectoris: Secondary | ICD-10-CM | POA: Diagnosis not present

## 2015-09-14 DIAGNOSIS — I340 Nonrheumatic mitral (valve) insufficiency: Secondary | ICD-10-CM

## 2015-09-14 DIAGNOSIS — I5043 Acute on chronic combined systolic (congestive) and diastolic (congestive) heart failure: Secondary | ICD-10-CM | POA: Diagnosis not present

## 2015-09-14 DIAGNOSIS — I5022 Chronic systolic (congestive) heart failure: Secondary | ICD-10-CM | POA: Insufficient documentation

## 2015-09-14 DIAGNOSIS — I272 Other secondary pulmonary hypertension: Secondary | ICD-10-CM | POA: Diagnosis not present

## 2015-09-14 DIAGNOSIS — I481 Persistent atrial fibrillation: Secondary | ICD-10-CM | POA: Diagnosis not present

## 2015-09-14 DIAGNOSIS — Z951 Presence of aortocoronary bypass graft: Secondary | ICD-10-CM | POA: Insufficient documentation

## 2015-09-14 DIAGNOSIS — Z7902 Long term (current) use of antithrombotics/antiplatelets: Secondary | ICD-10-CM | POA: Diagnosis not present

## 2015-09-14 DIAGNOSIS — I081 Rheumatic disorders of both mitral and tricuspid valves: Secondary | ICD-10-CM | POA: Diagnosis not present

## 2015-09-14 DIAGNOSIS — I110 Hypertensive heart disease with heart failure: Secondary | ICD-10-CM | POA: Insufficient documentation

## 2015-09-14 DIAGNOSIS — I252 Old myocardial infarction: Secondary | ICD-10-CM | POA: Diagnosis not present

## 2015-09-14 LAB — BASIC METABOLIC PANEL
ANION GAP: 12 (ref 5–15)
BUN: 31 mg/dL — ABNORMAL HIGH (ref 6–20)
CALCIUM: 9.6 mg/dL (ref 8.9–10.3)
CO2: 20 mmol/L — ABNORMAL LOW (ref 22–32)
Chloride: 104 mmol/L (ref 101–111)
Creatinine, Ser: 1.26 mg/dL — ABNORMAL HIGH (ref 0.61–1.24)
GFR, EST NON AFRICAN AMERICAN: 57 mL/min — AB (ref >60)
GLUCOSE: 104 mg/dL — AB (ref 65–99)
POTASSIUM: 5.1 mmol/L (ref 3.5–5.1)
Sodium: 136 mmol/L (ref 135–145)

## 2015-09-14 MED ORDER — SACUBITRIL-VALSARTAN 24-26 MG PO TABS: 1.0000 | ORAL_TABLET | Freq: Two times a day (BID) | ORAL | Status: DC

## 2015-09-14 NOTE — Patient Instructions (Addendum)
Routine lab work today. Will notify you of abnormal results, otherwise no news is good news!  STOP Lisinopril.  START Entresto 24/26 tablet twice daily on Sunday.  Follow up 1 month.  Do the following things EVERYDAY: 1) Weigh yourself in the morning before breakfast. Write it down and keep it in a log. 2) Take your medicines as prescribed 3) Eat low salt foods-Limit salt (sodium) to 2000 mg per day.  4) Stay as active as you can everyday 5) Limit all fluids for the day to less than 2 liters

## 2015-09-15 NOTE — Progress Notes (Signed)
Patient ID: Stanley Wells, male   DOB: [birth year], 70 y.o.   MRN: [id number] PCP: Dr. Jeanie Sewer  70 yo with history of CAD, ischemic cardiomyopathy/chronic systolic CHF, severe functional mitral regurgitation, and persistent atrial fibrillation presents for CHF clinic evaluation.  Patient had MI then CABG in 2003.  Cardiac cath in 2015 showed only LIMA-LAD and SVG-OM patent.  In 2015, EF was 30-35%.  He had atrial flutter in 2015 and again in 11/16, both times was cardioverted.  After the 11/16 cardioversion, he stopped all his meds.  He was admitted again in 12/16 with atrial fibrillation/RVR and a CHF exacerbation.  He was diuresed and started on Eliquis with plan for DCCV after 4 weeks of anticoagulation.  He was brought back at the end of December for cardioversion, but this was unsuccessful.  TEE in 12/16 showed EF 20-25% with severe functional MR.    He was seen by Dr [name] with plan for tentative plan for mitral valve repair versus replacement and possible tricuspid valve repair along with MAZE procedure.  He was referred to CHF clinic for optimization prior to surgery.   After last appointment, I took him for left and right heart cath. RHC showed good left and right heart filling pressures but cardiac output was low.  Coronary angiography was stable with patent LIMA-LAD and SVG-OM.  Other SVGs occluded.    Weight is down 3 lbs on higher Lasix since last appointment.  He is walking on flat ground and up a flight of steps without dyspnea.  No orthopnea/PND.  No chest pain.  Taking Eliquis, no BRBPR or melena.   Labs (9/16): LDL 126, HDL 38 Labs (12/16): K 4, creatinine 1.15 Labs (1/17): K 4.4, creatinine 1.09, LDL 42, HDL 39  PMH: 1. CAD: s/p MI in 2003.  CABG x 5 in 2003 with LIMA-LAD, SVG-D1, SVG-OM2, seq SVG-PDA/PLV.  LHC/RHC 11/15 with native arteries essentially occluded.  SVG-D1 and seq SVG-PDA/PLV occluded, LIMA and SVG-OM patent.  LHC (1/17) with patent LIMA-LAD, occluded SVG-D, patent  SVG-OM => SVG touches down on inferior branch of OM1 that backfills to superior branch of OM1, there are serial 70-80% stenoses in the proximal inferior OM1 branch that may compromise backflow to superior branch OM1, SVG-PDA/PLV totally occluded, EF 25% . 2. Atrial flutter: DCCV 2015.  Recurred 11/16 with DCCV.   3. Chronic systolic CHF: Ischemic cardiomyopathy.  Echo (2015) with moderate-severe MR, EF 30-35%.  TEE (12/16) with severe central MR (functional), EF 20-25% with diffuse hypokinesis, moderately decreased RV systolic function, moderate TR.  RHC (1/17) with mean RA 5, PA 27/12 mean 18, mean PCWP 10, CI 1.7 Fick/1.83 thermo. EF 25% on 1/17 LV-gram.  4. Mitral regurgitation: Severe, suspect functional. 5. OSA: Not using CPAP.  6. HTN 7. Nephrolithiasis 8. Atrial fibrillation: Noted in 12/16, now persistent.  9. PFTs (1/17) with minimal obstructive airways disease but severely decreased DLCO (37% predicted), possibly suggestive of pulmonary vascular disease.   SH: Prior smoker quit 2013, lives in [place], retired Art gallery manager, married.   FH: No premature CAD.  ROS: All systems reviewed and negative except as per HPI.   Current Outpatient Prescriptions  Medication Sig Dispense Refill  . DIGITEK 125 MCG tablet Take 0.125 mg by mouth daily.   0  . ELIQUIS 5 MG TABS tablet Take 5 mg by mouth 2 (two) times daily.  1  . furosemide (LASIX) 40 MG tablet TAKE 1 TABLET BY MOUTH IN THE MORNING AND 1/2 TABLET IN THE EVENING  0  . isosorbide mononitrate (IMDUR) 30 MG 24 hr tablet   0  . metoprolol succinate (TOPROL-XL) 50 MG 24 hr tablet   1  . Multiple Vitamins-Minerals (MULTIVITAMIN WITH MINERALS) tablet Take 1 tablet by mouth daily.    . potassium chloride (K-DUR,KLOR-CON) 10 MEQ tablet Take 20 mEq by mouth daily.  0  . rosuvastatin (CRESTOR) 40 MG tablet   0  . spironolactone (ALDACTONE) 25 MG tablet take 1/2 OF A tablet by mouth once daily  0  . sacubitril-valsartan (ENTRESTO) 24-26 MG  Take 1 tablet by mouth 2 (two) times daily. 60 tablet 3  . [DISCONTINUED] amLODipine (NORVASC) 5 MG tablet Take 1 tablet (5 mg total) by mouth daily. 30 tablet 6   No current facility-administered medications for this encounter.   BP 124/71 mmHg  Pulse 70  Resp 18  Wt 209 lb (94.802 kg)  SpO2 97% General: NAD Neck: JVP 7 cm, no thyromegaly or thyroid nodule.  Lungs: Decreased breath sounds bilaterally.  CV: Nondisplaced PMI.  Heart sounds quiet, regular S1/S2, no S3/S4, 1/6 HSM apex.  No peripheral edema.  No carotid bruit.  Normal pedal pulses.  Abdomen: Soft, nontender, no hepatosplenomegaly, no distention.  Skin: Intact without lesions or rashes.  Neurologic: Alert and oriented x 3.  Psych: Normal affect. Extremities: No clubbing or cyanosis.  HEENT: Normal.   Assessment/Plan: 1. Chronic systolic CHF: EF 21-30% on TEE from 12/16.  Ischemic cardiomyopathy.  Volume status improved on higher Lasix.  NYHA class II symptoms.  Low cardiac output by RHC in 1/17 but filling pressures optimized.  - Continue current Lasix and spironolactone.  BMET today. - Continue digoxin, check level today. - Continue Toprol XL.  - Stop lisinopril. After 36 hours, will start Entresto 24/26 bid. BMET in 2 wks. - Will follow with repeat echo after surgery. If EF remains low, would be ICD candidate.  Narrow QRS so no CRT.  2. CAD: s/p CABG.  On 1/17 cath, only LIMA-LAD and SVG-OM2 still patent.  I do not think that he would derive much benefit from attempting redo CABG.  If he has surgery, would like it done as right mini-thoractomy, avoiding sternotomy in case LVAD is needed down the road.  - No ASA given use of apixaban.  - Continue Crestor, good lipids 1/17.  - He is planned for MRI viability study to help make the final decision on CABG versus no CABG. 3. Atrial fibrillation: Persistent.  Failed DCCV in 12/16.  Rate controlled with Toprol XL 50 mg qd and anticoagulated with apixaban.  MAZE procedure is  planned with his mitral valve operation.  4. Mitral regurgitation: Severe, probably functional.   Currently being evaluated for MV repair or replacement. Would favor right mini-thoracotomy for MAZE/MV repair or replacement.  This would preserve re-do sternotomy for possible LVAD placement down the road. His filling pressures were normal on recent RHC but cardiac output was low.  He is at moderately high risk for complications with surgery.  Would load with amiodarone pre-operative, may make MAZE more likely to hold. 5. Tricuspid regurgitation: Moderate.  May be repaired at time of surgery.   Marca Ancona 09/15/2015

## 2015-09-17 ENCOUNTER — Ambulatory Visit: Payer: Medicare Other | Admitting: Thoracic Surgery (Cardiothoracic Vascular Surgery)

## 2015-09-19 ENCOUNTER — Telehealth (HOSPITAL_COMMUNITY): Payer: Self-pay | Admitting: *Deleted

## 2015-09-19 NOTE — Telephone Encounter (Signed)
Notes Recorded by Noralee Space, RN on 09/19/2015 at 5:09 PM Pt and wife aware and agreeable, repeat labs 2/27

## 2015-09-19 NOTE — Telephone Encounter (Signed)
-----   Message from Laurey Morale, MD sent at 09/16/2015 12:16 AM EST ----- Stop KCl. Repeat BMET 1 week.

## 2015-10-01 ENCOUNTER — Ambulatory Visit (INDEPENDENT_AMBULATORY_CARE_PROVIDER_SITE_OTHER): Payer: Medicare Other | Admitting: Thoracic Surgery (Cardiothoracic Vascular Surgery)

## 2015-10-01 ENCOUNTER — Telehealth (HOSPITAL_COMMUNITY): Payer: Self-pay | Admitting: Pharmacist

## 2015-10-01 ENCOUNTER — Ambulatory Visit (HOSPITAL_COMMUNITY)
Admission: RE | Admit: 2015-10-01 | Discharge: 2015-10-01 | Disposition: A | Discharge status: Home / Self Care | Payer: Medicare Other | Source: Ambulatory Visit | Attending: Thoracic Surgery (Cardiothoracic Vascular Surgery) | Admitting: Thoracic Surgery (Cardiothoracic Vascular Surgery)

## 2015-10-01 ENCOUNTER — Encounter: Payer: Self-pay | Admitting: Thoracic Surgery (Cardiothoracic Vascular Surgery)

## 2015-10-01 ENCOUNTER — Ambulatory Visit (HOSPITAL_BASED_OUTPATIENT_CLINIC_OR_DEPARTMENT_OTHER)
Admission: RE | Admit: 2015-10-01 | Discharge: 2015-10-01 | Disposition: A | Discharge status: Home / Self Care | Payer: Medicare Other | Source: Ambulatory Visit | Attending: Cardiology | Admitting: Cardiology

## 2015-10-01 VITALS — BP 114/72 | HR 57 | Resp 20 | Ht 71.0 in | Wt 209.0 lb

## 2015-10-01 DIAGNOSIS — I340 Nonrheumatic mitral (valve) insufficiency: Secondary | ICD-10-CM | POA: Insufficient documentation

## 2015-10-01 DIAGNOSIS — I5043 Acute on chronic combined systolic (congestive) and diastolic (congestive) heart failure: Secondary | ICD-10-CM | POA: Diagnosis not present

## 2015-10-01 DIAGNOSIS — I5042 Chronic combined systolic (congestive) and diastolic (congestive) heart failure: Secondary | ICD-10-CM

## 2015-10-01 DIAGNOSIS — I2510 Atherosclerotic heart disease of native coronary artery without angina pectoris: Secondary | ICD-10-CM

## 2015-10-01 DIAGNOSIS — I517 Cardiomegaly: Secondary | ICD-10-CM | POA: Diagnosis not present

## 2015-10-01 DIAGNOSIS — I481 Persistent atrial fibrillation: Secondary | ICD-10-CM

## 2015-10-01 DIAGNOSIS — Z951 Presence of aortocoronary bypass graft: Secondary | ICD-10-CM

## 2015-10-01 DIAGNOSIS — I255 Ischemic cardiomyopathy: Secondary | ICD-10-CM | POA: Diagnosis not present

## 2015-10-01 DIAGNOSIS — I071 Rheumatic tricuspid insufficiency: Secondary | ICD-10-CM

## 2015-10-01 DIAGNOSIS — I4819 Other persistent atrial fibrillation: Secondary | ICD-10-CM

## 2015-10-01 LAB — BASIC METABOLIC PANEL
Anion gap: 8 (ref 5–15)
BUN: 25 mg/dL — ABNORMAL HIGH (ref 6–20)
CALCIUM: 9.5 mg/dL (ref 8.9–10.3)
CO2: 25 mmol/L (ref 22–32)
CREATININE: 1.27 mg/dL — AB (ref 0.61–1.24)
Chloride: 106 mmol/L (ref 101–111)
GFR, EST NON AFRICAN AMERICAN: 56 mL/min — AB (ref >60)
Glucose, Bld: 107 mg/dL — ABNORMAL HIGH (ref 65–99)
Potassium: 4.5 mmol/L (ref 3.5–5.1)
SODIUM: 139 mmol/L (ref 135–145)

## 2015-10-01 LAB — DIGOXIN LEVEL: DIGOXIN LVL: 0.4 ng/mL — AB (ref 0.8–2.0)

## 2015-10-01 MED ORDER — GADOBENATE DIMEGLUMINE 529 MG/ML IV SOLN
30.0000 mL | Freq: Once | INTRAVENOUS | Status: AC
  Administered 2015-10-01: 29 mL via INTRAVENOUS

## 2015-10-01 NOTE — Addendum Note (Signed)
Encounter addended by: Theresia Bough, CMA on: 10/01/2015  9:56 AM<BR>     Documentation filed: [employer], Orders

## 2015-10-01 NOTE — Patient Instructions (Signed)
Continue all previous medications without any changes at this time  

## 2015-10-01 NOTE — Telephone Encounter (Signed)
Spoke with Mr. And Mrs. Such today about Entresto coverage. Through [employer], no PA required. Attempted to enroll him in [employer] but he makes too much and does not qualify. This will be relayed to patient.   Tyler Deis. Bonnye Fava, PharmD, BCPS, CPP Clinical Pharmacist Pager: [phone] Phone: [phone] 10/01/2015 12:02 PM

## 2015-10-01 NOTE — Progress Notes (Signed)
[address]       [place] [address]             [phone]     CARDIOTHORACIC SURGERY OFFICE NOTE  Referring Provider is Duke Salvia, MD  Primary Cardiologist is Lennette Bihari, MD PCP is [facility] Valrie Hart., MD   HPI:  Patient returns to the office today for follow-up of mitral regurgitation, coronary artery disease with ischemic cardiomyopathy, recurrent persistent atrial fibrillation, and chronic combined systolic and diastolic congestive heart failure. He was originally seen in consultation on 08/09/2015. Since then he has been evaluated in the advanced heart failure clinic by Dr. Shirlee Latch. He underwent diagnostic cardiac catheterization on 08/28/2015. He was last seen here in our office on 09/03/2015. Since then he was scheduled for cardiac MRI viability study which was performed this morning, results remain pending at this time. The patient returns to the office to discuss treatment options further.  The patient states that he is now feeling remarkably well. He denies any symptoms of exertional shortness of breath. He has not started any type of strenuous exercise, but with normal activities he has no symptoms of exertional shortness of breath whatsoever.  He has never had any chest pain or chest tightness. The last time he experienced an episode of shortness of breath with activity was several weeks ago. With his current regimen of medications he appears to be doing remarkably well.   Current Outpatient Prescriptions  Medication Sig Dispense Refill  . DIGITEK 125 MCG tablet Take 0.125 mg by mouth daily.   0  . ELIQUIS 5 MG TABS tablet Take 5 mg by mouth 2 (two) times daily.  1  . furosemide (LASIX) 40 MG tablet TAKE 1 TABLET BY MOUTH IN THE MORNING AND 1/2 TABLET IN THE EVENING  0  . isosorbide mononitrate (IMDUR) 30 MG 24 hr tablet Take 30 mg by mouth daily.   0  . metoprolol succinate (TOPROL-XL) 50 MG 24 hr tablet Take 50 mg by mouth 2 (two) times  daily.   1  . Multiple Vitamins-Minerals (MULTIVITAMIN WITH MINERALS) tablet Take 1 tablet by mouth daily.    . rosuvastatin (CRESTOR) 40 MG tablet Take 40 mg by mouth daily.   0  . sacubitril-valsartan (ENTRESTO) 24-26 MG Take 1 tablet by mouth 2 (two) times daily. 60 tablet 3  . spironolactone (ALDACTONE) 25 MG tablet take 1/2 OF A tablet by mouth once daily  0  . [DISCONTINUED] amLODipine (NORVASC) 5 MG tablet Take 1 tablet (5 mg total) by mouth daily. 30 tablet 6   No current facility-administered medications for this visit.      Physical Exam:   BP 114/72 mmHg  Pulse 57  Resp 20  Ht  (1.803 m)  Wt 209 lb (94.802 kg)  BMI 29.16 kg/m2  SpO2 96%  General:  Well-appearing   Chest:   Clear to auscultation  CV:   Irregular rate and rhythm without murmur  Incisions:  n/a  Abdomen:  Soft and nontender  Extremities:  Warm and well-perfused, no lower extremity edema  Diagnostic Tests:  n/a   Impression:  Patient has long-standing coronary artery disease status post coronary artery bypass grafting in 2003, ischemic cardiomyopathy, ischemic mitral regurgitation, persistent atrial fibrillation, and chronic combined systolic and diastolic congestive heart failure. With recent adjustment in his medical therapy his symptoms have improved dramatically. Presently the patient denies any symptoms of exertional shortness of breath, although he admits  that he has not been pushing himself very hard physically.  His blood pressure is currently under excellent control and I can no longer hear a murmur on physical exam. In the setting of secondary mitral regurgitation might be prudent to hold off on plans for surgery as long as the patient continues to do well on medical therapy.    Plan:  I have discussed matters at length with the patient and his wife in the office today. Under the circumstances I favor continued close observation on medical therapy. If patient experiences significant  exertional shortness of breath we could proceed with mitral valve repair or replacement and possible concomitant Maze procedure. On the other hand, now that his hypertension has been brought under control he seems to be doing remarkably well on medical therapy. Patient will return for follow-up in 3 months or sooner should symptoms get worse. He will continue to follow up closely with Dr. Shirlee Latch in the advanced heart failure clinic.  I spent in excess of 15 minutes during the conduct of this office consultation and >50% of this time involved direct face-to-face encounter with the patient for counseling and/or coordination of their care.    Salvatore Decent. [name], MD 10/01/2015 10:59 AM

## 2015-10-03 ENCOUNTER — Telehealth (HOSPITAL_COMMUNITY): Payer: Self-pay

## 2015-10-03 ENCOUNTER — Telehealth (HOSPITAL_COMMUNITY): Payer: Self-pay | Admitting: Pharmacist

## 2015-10-03 NOTE — Telephone Encounter (Signed)
Patient gave me wrong income information so was able to enroll him in [employer] and he now has $1500 for the year towards his Entresto copays. [facility] and relayed information to them.   Billing ID: [id number] Person Code: [id number] RX Group: [id number] RX BIN: [id number] PCN for Part D: [id number]    Tyler Deis. Bonnye Fava, PharmD, BCPS, CPP Clinical Pharmacist Pager: [phone] Phone: [phone] 10/03/2015 5:07 PM

## 2015-10-03 NOTE — Telephone Encounter (Signed)
Patient's wife contacted CHF clinic triage line to report patient recently spoke to [place] [facility] clinical pharmicist, howveer, she reports he gave her wrong informationa nd needs to speak with her concerning this.  Will forward message to erika to return call and address.  Ave Filter

## 2015-10-15 ENCOUNTER — Ambulatory Visit (HOSPITAL_COMMUNITY)
Admission: RE | Admit: 2015-10-15 | Discharge: 2015-10-15 | Disposition: A | Discharge status: Home / Self Care | Payer: Medicare Other | Source: Ambulatory Visit | Attending: Cardiology | Admitting: Cardiology

## 2015-10-15 ENCOUNTER — Other Ambulatory Visit (HOSPITAL_COMMUNITY): Payer: Self-pay | Admitting: *Deleted

## 2015-10-15 ENCOUNTER — Other Ambulatory Visit (HOSPITAL_COMMUNITY): Payer: Self-pay

## 2015-10-15 ENCOUNTER — Encounter (HOSPITAL_COMMUNITY): Payer: Self-pay

## 2015-10-15 VITALS — BP 100/65 | HR 90 | Resp 18 | Wt 216.2 lb

## 2015-10-15 DIAGNOSIS — G4733 Obstructive sleep apnea (adult) (pediatric): Secondary | ICD-10-CM | POA: Insufficient documentation

## 2015-10-15 DIAGNOSIS — I110 Hypertensive heart disease with heart failure: Secondary | ICD-10-CM | POA: Insufficient documentation

## 2015-10-15 DIAGNOSIS — Z87891 Personal history of nicotine dependence: Secondary | ICD-10-CM | POA: Diagnosis not present

## 2015-10-15 DIAGNOSIS — I481 Persistent atrial fibrillation: Secondary | ICD-10-CM | POA: Diagnosis not present

## 2015-10-15 DIAGNOSIS — Z7901 Long term (current) use of anticoagulants: Secondary | ICD-10-CM | POA: Diagnosis not present

## 2015-10-15 DIAGNOSIS — I2510 Atherosclerotic heart disease of native coronary artery without angina pectoris: Secondary | ICD-10-CM | POA: Diagnosis present

## 2015-10-15 DIAGNOSIS — I340 Nonrheumatic mitral (valve) insufficiency: Secondary | ICD-10-CM

## 2015-10-15 DIAGNOSIS — I5042 Chronic combined systolic (congestive) and diastolic (congestive) heart failure: Secondary | ICD-10-CM

## 2015-10-15 DIAGNOSIS — I255 Ischemic cardiomyopathy: Secondary | ICD-10-CM | POA: Insufficient documentation

## 2015-10-15 DIAGNOSIS — Z951 Presence of aortocoronary bypass graft: Secondary | ICD-10-CM

## 2015-10-15 DIAGNOSIS — I5022 Chronic systolic (congestive) heart failure: Secondary | ICD-10-CM | POA: Diagnosis not present

## 2015-10-15 DIAGNOSIS — R0600 Dyspnea, unspecified: Secondary | ICD-10-CM

## 2015-10-15 DIAGNOSIS — I5043 Acute on chronic combined systolic (congestive) and diastolic (congestive) heart failure: Secondary | ICD-10-CM

## 2015-10-15 DIAGNOSIS — Z79899 Other long term (current) drug therapy: Secondary | ICD-10-CM | POA: Insufficient documentation

## 2015-10-15 DIAGNOSIS — I4819 Other persistent atrial fibrillation: Secondary | ICD-10-CM

## 2015-10-15 MED ORDER — AMIODARONE HCL 200 MG PO TABS: 200.0000 mg | ORAL_TABLET | Freq: Every day | ORAL | Status: DC

## 2015-10-15 NOTE — Patient Instructions (Signed)
START Amiodarone 200mg  twice daily.  You have been scheduled for a cardioversion on 3/20//2017. See instruction sheet for details.  Will schedule you for Cardiopulmonary Exercise Test. This test is done at our Heart Failure Clinic. Please wear comfortable clothes and shoes for this test. Avoid heavy meal before the test (light snack/meal recommended). Avoid caffeine, alcohol, tobacco products 12 hrs before test. Please give 24 hr notice for cancellations/rescheduling: [phone].  Follow up 6 weeks with Dr. Shirlee LatchMcLean.  Do the following things EVERYDAY: 1) Weigh yourself in the morning before breakfast. Write it down and keep it in a log. 2) Take your medicines as prescribed 3) Eat low salt foods-Limit salt (sodium) to 2000 mg per day.  4) Stay as active as you can everyday 5) Limit all fluids for the day to less than 2 liters

## 2015-10-15 NOTE — Progress Notes (Signed)
Patient ID: Stanley Wells, male   DOB: 06/09/1946, 70 y.o.   MRN: [id number] PCP: Dr. Jeanie [place]  70 yo with history of CAD, ischemic cardiomyopathy/chronic systolic CHF, severe functional mitral regurgitation, and persistent atrial fibrillation presents for CHF clinic evaluation.  Patient had MI then CABG in 2003.  Cardiac cath in 2015 showed only LIMA-LAD and SVG-OM patent.  In 2015, EF was 30-35%.  He had atrial flutter in 2015 and again in 11/16, both times was cardioverted.  After the 11/16 cardioversion, he stopped all his meds.  He was admitted again in 12/16 with atrial fibrillation/RVR and a CHF exacerbation.  He was diuresed and started on Eliquis with plan for DCCV after 4 weeks of anticoagulation.  He was brought back at the end of December for cardioversion, but this was unsuccessful.  TEE in 12/16 showed EF 20-25% with severe functional MR.    He was seen by Dr Cornelius Moraswen with plan for tentative plan for mitral valve repair versus replacement and possible tricuspid valve repair along with MAZE procedure.  He was referred to CHF clinic for optimization prior to surgery.   After last appointment, he went for left and right heart cath. RHC showed good left and right heart filling pressures but cardiac output was low.  Coronary angiography was stable with patent LIMA-LAD and SVG-OM.  Other SVGs occluded.    Today he returns for HF follow up. Last visit lisinopril was stopped and entresto was added. Overall feeling pretty good. Denies SOB/PND/Orthopnea. Denies SOB with inclines. Every now an then dizzy. Weight at home 207-208 pounds. Does admit to mild leg fatigue. Able to do more outside.  Weight down 7 lbs on our scale.   ECG: Atrial fibrillation, old ASMI, old inferior MI, narrow QRS.   Labs (9/16): LDL 126, HDL 38 Labs (12/16): K 4, creatinine 1.15 Labs (1/17): K 4.4, creatinine 1.09, LDL 42, HDL 39 Labs (10/01/2015): K 4.5 Creatinine 1.27 Dig level 0.4   PMH: 1. CAD: s/p MI in 2003.  CABG x 5  in 2003 with LIMA-LAD, SVG-D1, SVG-OM2, seq SVG-PDA/PLV.  LHC/RHC 11/15 with native arteries essentially occluded.  SVG-D1 and seq SVG-PDA/PLV occluded, LIMA and SVG-OM patent.  LHC (1/17) with patent LIMA-LAD, occluded SVG-D, patent SVG-OM => SVG touches down on inferior branch of OM1 that backfills to superior branch of OM1, there are serial 70-80% stenoses in the proximal inferior OM1 branch that may compromise backflow to superior branch OM1, SVG-PDA/PLV totally occluded, EF 25% . 2. Atrial flutter: DCCV 2015.  Recurred 11/16 with DCCV.   3. Chronic systolic CHF: Ischemic cardiomyopathy.  Echo (2015) with moderate-severe MR, EF 30-35%.  TEE (12/16) with severe central MR (functional), EF 20-25% with diffuse hypokinesis, moderately decreased RV systolic function, moderate TR.  RHC (1/17) with mean RA 5, PA 27/12 mean 18, mean PCWP 10, CI 1.7 Fick/1.83 thermo. EF 25% on 1/17 LV-gram.  Cardiac MRI (2/17) with EF 19%, dilated LV, images not adequate to assess delayed enhancement.  4. Mitral regurgitation: Severe, suspect functional. 5. OSA: Not using CPAP.  6. HTN 7. Nephrolithiasis 8. Atrial fibrillation: Noted in 12/16, now persistent.  9. PFTs (1/17) with minimal obstructive airways disease but severely decreased DLCO (37% predicted), possibly suggestive of pulmonary vascular disease.   SH: Prior smoker quit 2013, [place], retired Art gallery managerengineer, married.   FH: No premature CAD.  ROS: All systems reviewed and negative except as per HPI.   Current Outpatient Prescriptions  Medication Sig Dispense Refill  .  DIGITEK 125 MCG tablet Take 0.125 mg by mouth daily.   0  . ELIQUIS 5 MG TABS tablet Take 5 mg by mouth 2 (two) times daily.  1  . furosemide (LASIX) 40 MG tablet TAKE 1 TABLET BY MOUTH IN THE MORNING AND 1/2 TABLET IN THE EVENING  0  . isosorbide mononitrate (IMDUR) 30 MG 24 hr tablet Take 30 mg by mouth daily.   0  . metoprolol succinate (TOPROL-XL) 50 MG 24 hr tablet Take 50 mg  by mouth 2 (two) times daily.   1  . Multiple Vitamins-Minerals (MULTIVITAMIN WITH MINERALS) tablet Take 1 tablet by mouth daily.    . rosuvastatin (CRESTOR) 40 MG tablet Take 40 mg by mouth daily.   0  . sacubitril-valsartan (ENTRESTO) 24-26 MG Take 1 tablet by mouth 2 (two) times daily. 60 tablet 3  . spironolactone (ALDACTONE) 25 MG tablet take 1/2 OF A tablet by mouth once daily  0  . [DISCONTINUED] amLODipine (NORVASC) 5 MG tablet Take 1 tablet (5 mg total) by mouth daily. 30 tablet 6   No current facility-administered medications for this encounter.   BP 100/65 mmHg  Pulse 90  Resp 18  Wt 216 lb 4 oz (98.09 kg)  SpO2 99% General: NAD Neck: JVP not elevated, no thyromegaly or thyroid nodule.  Lungs: Decreased breath sounds bilaterally.  CV: Nondisplaced PMI.  Heart sounds quiet, irregular S1/S2, no S3/S4, 1/6 HSM apex.  No peripheral edema.  No carotid bruit.  Normal pedal pulses.  Abdomen: Soft, nontender, no hepatosplenomegaly, no distention.  Skin: Intact without lesions or rashes.  Neurologic: Alert and oriented x 3.  Psych: Normal affect. Extremities: No clubbing or cyanosis.  HEENT: Normal.   Assessment/Plan: 1. Chronic systolic CHF: EF 16-10% on TEE from 12/16.  cMRI 2/17 with EF 19%, unable to assess for delayed enhancement on this study.  Ischemic cardiomyopathy.  NYHA class II symptoms.  Low cardiac output by RHC in 1/17 but filling pressures optimized.  Volume looks ok on exam today.  He does not have BP room for medication titration today.  - Continue current Lasix /20mg  and 12.5 mg spironolactone.   - Continue digoxin, level 0.4 on 09/2015  - Continue Toprol XL 50 mg twice a day .  - Continue entresto 24/26 bid. - If EF remains low, would be ICD candidate.  Narrow QRS so no CRT.  - Set up CPX in 4 weeks.  2. CAD: s/p CABG.  On 1/17 cath, only LIMA-LAD and SVG-OM2 still patent.  I do not think that he would derive much benefit from attempting redo CABG.   - No  ASA given use of apixaban.  - Continue Crestor, good lipids 1/17.  3. Atrial fibrillation: Persistent.  Failed DCCV in 12/16.  Rate controlled with Toprol XL 50 mg qd and anticoagulated with apixaban.  At this point, holding off on mitral valve repair/Maze, therefore think it would be reasonable to try again for cardioversion. - Start amiodarone 200 mg twice a day => if he remains on this, will need baseline PFTs and will need to follow LFTs/TSH.  - Plan for DC-CV in a few weeks after amiodarone load.  4. Mitral regurgitation: Severe by prior echo, probably functional.  Did not look as impressive on MRI and murmur today is not loud.  He is at moderately high risk for complications with surgery.  He saw Dr [name] recently => given symptomatic improvement and the functional nature of his mitral regurgitation, we are  holding off on MV replacement for the time being and will see how he does with medical treatment.  5. Tricuspid regurgitation: Moderate.    Follow up  6 weeks. Plan for CPX in 4 weeks.   Amy Clegg 10/15/2015   Patient seen with NP, agree with the above note.  He seems to be doing well overall.  He is not volume overloaded.  MR murmur is not prominent on exam.  Saw Dr [name] recently, and given improvement we are holding off on surgery for now (would be relatively high risk and MR is functional, so evidence for benefit from operation is less).   - I agree that it would be reasonable to try to get him out of atrial fibrillation using amiodarone and DCCV after amiodarone load.   - CPX will be arranged.   - If he worsens over time, would possible candidate for LVAD.   Marca Ancona 10/15/2015

## 2015-10-22 ENCOUNTER — Encounter (HOSPITAL_COMMUNITY)
Admission: RE | Disposition: A | Discharge status: Home / Self Care | Payer: Self-pay | Source: Ambulatory Visit | Attending: Cardiology

## 2015-10-22 ENCOUNTER — Ambulatory Visit (HOSPITAL_COMMUNITY)
Admission: RE | Admit: 2015-10-22 | Discharge: 2015-10-22 | Disposition: A | Discharge status: Home / Self Care | Payer: Medicare Other | Source: Ambulatory Visit | Attending: Cardiology | Admitting: Cardiology

## 2015-10-22 ENCOUNTER — Encounter (HOSPITAL_COMMUNITY): Payer: Self-pay | Admitting: *Deleted

## 2015-10-22 ENCOUNTER — Ambulatory Visit (HOSPITAL_COMMUNITY): Payer: Medicare Other | Admitting: Certified Registered Nurse Anesthetist

## 2015-10-22 DIAGNOSIS — R0600 Dyspnea, unspecified: Secondary | ICD-10-CM

## 2015-10-22 DIAGNOSIS — I110 Hypertensive heart disease with heart failure: Secondary | ICD-10-CM | POA: Insufficient documentation

## 2015-10-22 DIAGNOSIS — I4891 Unspecified atrial fibrillation: Secondary | ICD-10-CM | POA: Diagnosis not present

## 2015-10-22 DIAGNOSIS — G4733 Obstructive sleep apnea (adult) (pediatric): Secondary | ICD-10-CM | POA: Diagnosis not present

## 2015-10-22 DIAGNOSIS — E669 Obesity, unspecified: Secondary | ICD-10-CM | POA: Insufficient documentation

## 2015-10-22 DIAGNOSIS — Z951 Presence of aortocoronary bypass graft: Secondary | ICD-10-CM | POA: Diagnosis not present

## 2015-10-22 DIAGNOSIS — I10 Essential (primary) hypertension: Secondary | ICD-10-CM | POA: Diagnosis not present

## 2015-10-22 DIAGNOSIS — I340 Nonrheumatic mitral (valve) insufficiency: Secondary | ICD-10-CM | POA: Insufficient documentation

## 2015-10-22 DIAGNOSIS — I481 Persistent atrial fibrillation: Secondary | ICD-10-CM | POA: Diagnosis not present

## 2015-10-22 DIAGNOSIS — I252 Old myocardial infarction: Secondary | ICD-10-CM | POA: Insufficient documentation

## 2015-10-22 DIAGNOSIS — I5022 Chronic systolic (congestive) heart failure: Secondary | ICD-10-CM | POA: Diagnosis not present

## 2015-10-22 DIAGNOSIS — Z87891 Personal history of nicotine dependence: Secondary | ICD-10-CM | POA: Insufficient documentation

## 2015-10-22 DIAGNOSIS — Z7901 Long term (current) use of anticoagulants: Secondary | ICD-10-CM | POA: Insufficient documentation

## 2015-10-22 DIAGNOSIS — Z79899 Other long term (current) drug therapy: Secondary | ICD-10-CM | POA: Insufficient documentation

## 2015-10-22 DIAGNOSIS — I2510 Atherosclerotic heart disease of native coronary artery without angina pectoris: Secondary | ICD-10-CM | POA: Insufficient documentation

## 2015-10-22 DIAGNOSIS — I255 Ischemic cardiomyopathy: Secondary | ICD-10-CM | POA: Insufficient documentation

## 2015-10-22 HISTORY — PX: CARDIOVERSION: SHX1299

## 2015-10-22 LAB — POCT I-STAT 4, (NA,K, GLUC, HGB,HCT)
Glucose, Bld: 103 mg/dL — ABNORMAL HIGH (ref 65–99)
HCT: 44 % (ref 39.0–52.0)
Hemoglobin: 15 g/dL (ref 13.0–17.0)
POTASSIUM: 4.4 mmol/L (ref 3.5–5.1)
SODIUM: 140 mmol/L (ref 135–145)

## 2015-10-22 SURGERY — CARDIOVERSION
Anesthesia: Monitor Anesthesia Care

## 2015-10-22 MED ORDER — SODIUM CHLORIDE 0.9 % IV SOLN: 250.0000 mL | INTRAVENOUS | Status: DC

## 2015-10-22 MED ORDER — HYDROCORTISONE 1 % EX CREA: 1.0000 "application " | TOPICAL_CREAM | Freq: Three times a day (TID) | CUTANEOUS | Status: DC | PRN

## 2015-10-22 MED ORDER — SODIUM CHLORIDE 0.9% FLUSH: 3.0000 mL | Freq: Two times a day (BID) | INTRAVENOUS | Status: DC

## 2015-10-22 MED ORDER — PROPOFOL 10 MG/ML IV BOLUS
INTRAVENOUS | Status: DC | PRN
  Administered 2015-10-22: 50 mg via INTRAVENOUS

## 2015-10-22 MED ORDER — SODIUM CHLORIDE 0.9% FLUSH: 3.0000 mL | INTRAVENOUS | Status: DC | PRN

## 2015-10-22 MED ORDER — LIDOCAINE HCL (CARDIAC) 20 MG/ML IV SOLN
INTRAVENOUS | Status: DC | PRN
  Administered 2015-10-22: 60 mg via INTRAVENOUS

## 2015-10-22 MED ORDER — SODIUM CHLORIDE 0.9 % IV SOLN
INTRAVENOUS | Status: DC
Start: 2015-10-22 — End: 2015-10-22
  Administered 2015-10-22: 12:00:00 via INTRAVENOUS

## 2015-10-22 NOTE — Anesthesia Preprocedure Evaluation (Addendum)
Anesthesia Evaluation  Patient identified by MRN, date of birth, ID band Patient awake    Reviewed: Allergy & Precautions, H&P , NPO status , Patient's Chart, lab work & pertinent test results, reviewed documented beta blocker date and time   History of Anesthesia Complications Negative for: history of anesthetic complications  Airway Mallampati: II  TM Distance: >3 FB Neck ROM: Full    Dental  (+) Teeth Intact, Dental Advisory Given   Pulmonary sleep apnea , former smoker,    breath sounds clear to auscultation       Cardiovascular Exercise Tolerance: Good hypertension, Pt. on medications and Pt. on home beta blockers + CAD, + Past MI, + CABG and +CHF  + dysrhythmias (eliquis) Atrial Fibrillation + Valvular Problems/Murmurs MR  Rhythm:Irregular Rate:Normal  Echo 12/16: Study Conclusions  - Left ventricle: The cavity size was moderately dilated. Systolic function was severely reduced. The estimated ejection fraction was in the range of 20% to 25%. Diffuse hypokinesis. - Aortic valve: No evidence of vegetation. - Mitral valve: There is severe mitral regurgitation with centrally directed jet. The severity is based on systolic flow reversal in two pulmonary veins and regurgitant jet covering > 50% of the left atrium. PISA inacurate as there are at least two jets. Mitral valve leaflets are mildly thickened and not calcified and therefore favorable for repair. . There was severe regurgitation directed centrally. - Left atrium: The atrium was severely dilated. No evidence of thrombus in the atrial cavity or appendage. No evidence of thrombus in the atrial cavity or appendage. No evidence of thrombus in the appendage. - Right ventricle: Systolic function was moderately reduced. - Right atrium: The atrium was moderately dilated. No evidence of thrombus in the atrial cavity or appendage. - Tricuspid valve: There was moderate regurgitation. -  Pulmonic valve: No evidence of vegetation.  Impressions:  - There is severe mitral regurgitation with centrally directed jet.  The severity is based on systolic flow reversal in two pulmonary veins and regurgitant jet covering > 50% of the left atrium. PISA inacurate as there are at least two jets.  Mitral valve leaflets are mildly thickened and not calcified and therefore favorable for repair. .  s/p CABG 2003  s/p succesful TEE/DCCV on 06/01/14. b. on Eliquis   Neuro/Psych negative neurological ROS  negative psych ROS   GI/Hepatic negative GI ROS, Neg liver ROS,   Endo/Other  negative endocrine ROS  Renal/GU negative Renal ROS     Musculoskeletal negative musculoskeletal ROS (+)   Abdominal (+) + obese,  Abdomen: soft. Bowel sounds: normal.  Peds  Hematology  (+) Blood dyscrasia (eliquis), ,   Anesthesia Other Findings Day of surgery medications reviewed with the patient.  Reproductive/Obstetrics                            Anesthesia Physical Anesthesia Plan  ASA: IV  Anesthesia Plan: MAC   Post-op Pain Management:    Induction: Intravenous  Airway Management Planned: Nasal Cannula  Additional Equipment:   Intra-op Plan:   Post-operative Plan:   Informed Consent: I have reviewed the patients History and Physical, chart, labs and discussed the procedure including the risks, benefits and alternatives for the proposed anesthesia with the patient or authorized representative who has indicated his/her understanding and acceptance.   Dental advisory given  Plan Discussed with: CRNA and Anesthesiologist  Anesthesia Plan Comments: (Discussed risks/benefits/alternatives to MAC sedation including need for ventilatory support, hypotension, need  for conversion to general anesthesia.  All patient questions answered.  Patient wished to proceed.)        Anesthesia Quick Evaluation

## 2015-10-22 NOTE — Anesthesia Postprocedure Evaluation (Signed)
Anesthesia Post Note  Patient: [place] Wells  Procedure(s) Performed: Procedure(s) (LRB): CARDIOVERSION (N/A)  Patient location during evaluation: Endoscopy Anesthesia Type: MAC Level of consciousness: awake and alert Pain management: pain level controlled Vital Signs Assessment: post-procedure vital signs reviewed and stable Respiratory status: spontaneous breathing, nonlabored ventilation, respiratory function stable and patient connected to nasal cannula oxygen Cardiovascular status: stable and blood pressure returned to baseline Anesthetic complications: no    Last Vitals:  Filed Vitals:   10/22/15 1230 10/22/15 1240  BP: 117/83 118/76  Pulse: 66 63  Temp:    Resp: 16 15    Last Pain: There were no vitals filed for this visit.               Cecile HearingStephen Edward Darnel Mchan

## 2015-10-22 NOTE — Procedures (Signed)
Electrical Cardioversion Procedure Note [place] C [place] [id number] 08/04/1946  Procedure: Electrical Cardioversion Indications:  Atrial Fibrillation  Procedure Details Consent: Risks of procedure as well as the alternatives and risks of each were explained to the (patient/caregiver).  Consent for procedure obtained. Time Out: Verified patient identification, verified procedure, site/side was marked, verified correct patient position, special equipment/implants available, medications/allergies/relevent history reviewed, required imaging and test results available.  Performed  Patient placed on cardiac monitor, pulse oximetry, supplemental oxygen as necessary.  Sedation given: Propofol per anesthesiology Pacer pads placed anterior and posterior chest.  Cardioverted 1 time(s).  Cardioverted at 200J.  Evaluation Findings: Post procedure EKG shows: NSR Complications: None Patient did tolerate procedure well.   Stanley Wells 10/22/2015, 12:18 PM

## 2015-10-22 NOTE — Transfer of Care (Signed)
Immediate Anesthesia Transfer of Care Note  Patient: [place] Wells  Procedure(s) Performed: Procedure(s): CARDIOVERSION (N/A)  Patient Location: Endoscopy Unit  Anesthesia Type:MAC  Level of Consciousness: awake, alert  and oriented  Airway & Oxygen Therapy: Patient Spontanous Breathing and Patient connected to nasal cannula oxygen  Post-op Assessment: Report given to RN, Post -op Vital signs reviewed and stable and Patient moving all extremities  Post vital signs: Reviewed and stable  Last Vitals:  Filed Vitals:   10/22/15 0954  BP: 125/78  Pulse: 74  Temp: 36.4 C  Resp: 18    Complications: No apparent anesthesia complications

## 2015-10-22 NOTE — H&P (View-Only) (Signed)
Patient ID: Stanley Wells, male   DOB: 04/06/1946, 70 y.o.   MRN: [id number] PCP: Dr. Redding  70 yo with history of CAD, ischemic cardiomyopathy/chronic systolic CHF, severe functional mitral regurgitation, and persistent atrial fibrillation presents for CHF clinic evaluation.  Patient had MI then CABG in 2003.  Cardiac cath in 2015 showed only LIMA-LAD and SVG-OM patent.  In 2015, EF was 30-35%.  He had atrial flutter in 2015 and again in 11/16, both times was cardioverted.  After the 11/16 cardioversion, he stopped all his meds.  He was admitted again in 12/16 with atrial fibrillation/RVR and a CHF exacerbation.  He was diuresed and started on Eliquis with plan for DCCV after 4 weeks of anticoagulation.  He was brought back at the end of December for cardioversion, but this was unsuccessful.  TEE in 12/16 showed EF 20-25% with severe functional MR.    He was seen by Dr Owen with plan for tentative plan for mitral valve repair versus replacement and possible tricuspid valve repair along with MAZE procedure.  He was referred to CHF clinic for optimization prior to surgery.   After last appointment, he went for left and right heart cath. RHC showed good left and right heart filling pressures but cardiac output was low.  Coronary angiography was stable with patent LIMA-LAD and SVG-OM.  Other SVGs occluded.    Today he returns for HF follow up. Last visit lisinopril was stopped and entresto was added. Overall feeling pretty good. Denies SOB/PND/Orthopnea. Denies SOB with inclines. Every now an then dizzy. Weight at home 207-208 pounds. Does admit to mild leg fatigue. Able to do more outside.  Weight down 7 lbs on our scale.   ECG: Atrial fibrillation, old ASMI, old inferior MI, narrow QRS.   Labs (9/16): LDL 126, HDL 38 Labs (12/16): K 4, creatinine 1.15 Labs (1/17): K 4.4, creatinine 1.09, LDL 42, HDL 39 Labs (10/01/2015): K 4.5 Creatinine 1.27 Dig level 0.4   PMH: 1. CAD: s/p MI in 2003.  CABG x 5  in 2003 with LIMA-LAD, SVG-D1, SVG-OM2, seq SVG-PDA/PLV.  LHC/RHC 11/15 with native arteries essentially occluded.  SVG-D1 and seq SVG-PDA/PLV occluded, LIMA and SVG-OM patent.  LHC (1/17) with patent LIMA-LAD, occluded SVG-D, patent SVG-OM => SVG touches down on inferior branch of OM1 that backfills to superior branch of OM1, there are serial 70-80% stenoses in the proximal inferior OM1 branch that may compromise backflow to superior branch OM1, SVG-PDA/PLV totally occluded, EF 25% . 2. Atrial flutter: DCCV 2015.  Recurred 11/16 with DCCV.   3. Chronic systolic CHF: Ischemic cardiomyopathy.  Echo (2015) with moderate-severe MR, EF 30-35%.  TEE (12/16) with severe central MR (functional), EF 20-25% with diffuse hypokinesis, moderately decreased RV systolic function, moderate TR.  RHC (1/17) with mean RA 5, PA 27/12 mean 18, mean PCWP 10, CI 1.7 Fick/1.83 thermo. EF 25% on 1/17 LV-gram.  Cardiac MRI (2/17) with EF 19%, dilated LV, images not adequate to assess delayed enhancement.  4. Mitral regurgitation: Severe, suspect functional. 5. OSA: Not using CPAP.  6. HTN 7. Nephrolithiasis 8. Atrial fibrillation: Noted in 12/16, now persistent.  9. PFTs (1/17) with minimal obstructive airways disease but severely decreased DLCO (37% predicted), possibly suggestive of pulmonary vascular disease.   SH: Prior smoker quit 2013, lives in [place], retired engineer, married.   FH: No premature CAD.  ROS: All systems reviewed and negative except as per HPI.   Current Outpatient Prescriptions  Medication Sig Dispense Refill  .   DIGITEK 125 MCG tablet Take 0.125 mg by mouth daily.   0  . ELIQUIS 5 MG TABS tablet Take 5 mg by mouth 2 (two) times daily.  1  . furosemide (LASIX) 40 MG tablet TAKE 1 TABLET BY MOUTH IN THE MORNING AND 1/2 TABLET IN THE EVENING  0  . isosorbide mononitrate (IMDUR) 30 MG 24 hr tablet Take 30 mg by mouth daily.   0  . metoprolol succinate (TOPROL-XL) 50 MG 24 hr tablet Take 50 mg  by mouth 2 (two) times daily.   1  . Multiple Vitamins-Minerals (MULTIVITAMIN WITH MINERALS) tablet Take 1 tablet by mouth daily.    . rosuvastatin (CRESTOR) 40 MG tablet Take 40 mg by mouth daily.   0  . sacubitril-valsartan (ENTRESTO) 24-26 MG Take 1 tablet by mouth 2 (two) times daily. 60 tablet 3  . spironolactone (ALDACTONE) 25 MG tablet take 1/2 OF A tablet by mouth once daily  0  . [DISCONTINUED] amLODipine (NORVASC) 5 MG tablet Take 1 tablet (5 mg total) by mouth daily. 30 tablet 6   No current facility-administered medications for this encounter.   BP 100/65 mmHg  Pulse 90  Resp 18  Wt 216 lb 4 oz (98.09 kg)  SpO2 99% General: NAD Neck: JVP not elevated, no thyromegaly or thyroid nodule.  Lungs: Decreased breath sounds bilaterally.  CV: Nondisplaced PMI.  Heart sounds quiet, irregular S1/S2, no S3/S4, 1/6 HSM apex.  No peripheral edema.  No carotid bruit.  Normal pedal pulses.  Abdomen: Soft, nontender, no hepatosplenomegaly, no distention.  Skin: Intact without lesions or rashes.  Neurologic: Alert and oriented x 3.  Psych: Normal affect. Extremities: No clubbing or cyanosis.  HEENT: Normal.   Assessment/Plan: 1. Chronic systolic CHF: EF 20-25% on TEE from 12/16.  cMRI 2/17 with EF 19%, unable to assess for delayed enhancement on this study.  Ischemic cardiomyopathy.  NYHA class II symptoms.  Low cardiac output by RHC in 1/17 but filling pressures optimized.  Volume looks ok on exam today.  He does not have BP room for medication titration today.  - Continue current Lasix 40mg/20mg and 12.5 mg spironolactone.   - Continue digoxin, level 0.4 on 09/2015  - Continue Toprol XL 50 mg twice a day .  - Continue entresto 24/26 bid. - If EF remains low, would be ICD candidate.  Narrow QRS so no CRT.  - Set up CPX in 4 weeks.  2. CAD: s/p CABG.  On 1/17 cath, only LIMA-LAD and SVG-OM2 still patent.  I do not think that he would derive much benefit from attempting redo CABG.   - No  ASA given use of apixaban.  - Continue Crestor, good lipids 1/17.  3. Atrial fibrillation: Persistent.  Failed DCCV in 12/16.  Rate controlled with Toprol XL 50 mg qd and anticoagulated with apixaban.  At this point, holding off on mitral valve repair/Maze, therefore think it would be reasonable to try again for cardioversion. - Start amiodarone 200 mg twice a day => if he remains on this, will need baseline PFTs and will need to follow LFTs/TSH.  - Plan for DC-CV in a few weeks after amiodarone load.  4. Mitral regurgitation: Severe by prior echo, probably functional.  Did not look as impressive on MRI and murmur today is not loud.  He is at moderately high risk for complications with surgery.  He saw Dr Owen recently => given symptomatic improvement and the functional nature of his mitral regurgitation, we are   holding off on MV replacement for the time being and will see how he does with medical treatment.  5. Tricuspid regurgitation: Moderate.    Follow up  6 weeks. Plan for CPX in 4 weeks.   Amy Clegg 10/15/2015   Patient seen with NP, agree with the above note.  He seems to be doing well overall.  He is not volume overloaded.  MR murmur is not prominent on exam.  Saw Dr Owen recently, and given improvement we are holding off on surgery for now (would be relatively high risk and MR is functional, so evidence for benefit from operation is less).   - I agree that it would be reasonable to try to get him out of atrial fibrillation using amiodarone and DCCV after amiodarone load.   - CPX will be arranged.   - If he worsens over time, would possible candidate for LVAD.   Hiliary Osorto 10/15/2015   

## 2015-10-22 NOTE — Interval H&P Note (Signed)
History and Physical Interval Note:  10/22/2015 12:13 PM  Stanley Wells  has presented today for surgery, with the diagnosis of AFIB  The various methods of treatment have been discussed with the patient and family. After consideration of risks, benefits and other options for treatment, the patient has consented to  Procedure(s): CARDIOVERSION (N/A) as a surgical intervention .  The patient's history has been reviewed, patient examined, no change in status, stable for surgery.  I have reviewed the patient's chart and labs.  Questions were answered to the patient's satisfaction.     Sianni Cloninger [employer]

## 2015-10-22 NOTE — Discharge Instructions (Signed)
Monitored Anesthesia Care °Monitored anesthesia care is an anesthesia service for a medical procedure. Anesthesia is the loss of the ability to feel pain. It is produced by medicines called anesthetics. It may affect a small area of your body (local anesthesia), a large area of your body (regional anesthesia), or your entire body (general anesthesia). The need for monitored anesthesia care depends your procedure, your condition, and the potential need for regional or general anesthesia. It is often provided during procedures where:  °· General anesthesia may be needed if there are complications. This is because you need special care when you are under general anesthesia.   °· You will be under local or regional anesthesia. This is so that you are able to have higher levels of anesthesia if needed.   °· You will receive calming medicines (sedatives). This is especially the case if sedatives are given to put you in a semi-conscious state of relaxation (deep sedation). This is because the amount of sedative needed to produce this state can be hard to predict. Too much of a sedative can produce general anesthesia. °Monitored anesthesia care is performed by one or more health care providers who have special training in all types of anesthesia. You will need to meet with these health care providers before your procedure. During this meeting, they will ask you about your medical history. They will also give you instructions to follow. (For example, you will need to stop eating and drinking before your procedure. You may also need to stop or change medicines you are taking.) During your procedure, your health care providers will stay with you. They will:  °· Watch your condition. This includes watching your blood pressure, breathing, and level of pain.   °· Diagnose and treat problems that occur.   °· Give medicines if they are needed. These may include calming medicines (sedatives) and anesthetics.   °· Make sure you are  comfortable.   °Having monitored anesthesia care does not necessarily mean that you will be under anesthesia. It does mean that your health care providers will be able to manage anesthesia if you need it or if it occurs. It also means that you will be able to have a different type of anesthesia than you are having if you need it. When your procedure is complete, your health care providers will continue to watch your condition. They will make sure any medicines wear off before you are allowed to go home.  °  °This information is not intended to replace advice given to you by your health care provider. Make sure you discuss any questions you have with your health care provider. °  °Document Released: 04/16/2005 Document Revised: 08/11/2014 Document Reviewed: 09/01/2012 °Elsevier Interactive Patient Education ©2016 [employer] °Electrical Cardioversion, Care After °Refer to this sheet in the next few weeks. These instructions provide you with information on caring for yourself after your procedure. Your health care provider may also give you more specific instructions. Your treatment has been planned according to current medical practices, but problems sometimes occur. Call your health care provider if you have any problems or questions after your procedure. °WHAT TO EXPECT AFTER THE PROCEDURE °After your procedure, it is typical to have the following sensations: °· Some redness on the skin where the shocks were delivered. If this is tender, a sunburn lotion or hydrocortisone cream may help. °· Possible return of an abnormal heart rhythm within hours or days after the procedure. °HOME CARE INSTRUCTIONS °· Take medicines only as directed by your health care provider.   Be sure you understand how and when to take your medicine. °· Learn how to feel your pulse and check it often. °· Limit your activity for 48 hours after the procedure or as directed by your health care provider. °· Avoid or minimize caffeine and other  stimulants as directed by your health care provider. °SEEK MEDICAL CARE IF: °· You feel like your heart is beating too fast or your pulse is not regular. °· You have any questions about your medicines. °· You have bleeding that will not stop. °SEEK IMMEDIATE MEDICAL CARE IF: °· You are dizzy or feel faint. °· It is hard to breathe or you feel short of breath. °· There is a change in discomfort in your chest. °· Your speech is slurred or you have trouble moving an arm or leg on one side of your body. °· You get a serious muscle cramp that does not go away. °· Your fingers or toes turn cold or blue. °  °This information is not intended to replace advice given to you by your health care provider. Make sure you discuss any questions you have with your health care provider. °  °Document Released: 05/11/2013 Document Revised: 08/11/2014 Document Reviewed: 05/11/2013 °Elsevier Interactive Patient Education ©2016 Elsevier Inc. ° °

## 2015-10-23 ENCOUNTER — Encounter (HOSPITAL_COMMUNITY): Payer: Self-pay | Admitting: Cardiology

## 2015-10-24 ENCOUNTER — Other Ambulatory Visit (HOSPITAL_COMMUNITY): Payer: Self-pay | Admitting: *Deleted

## 2015-10-24 MED ORDER — ISOSORBIDE MONONITRATE ER 30 MG PO TB24: 30.0000 mg | ORAL_TABLET | Freq: Every day | ORAL | Status: DC

## 2015-10-29 ENCOUNTER — Ambulatory Visit (HOSPITAL_COMMUNITY): Payer: Medicare Other | Attending: Cardiology

## 2015-10-29 ENCOUNTER — Other Ambulatory Visit (HOSPITAL_COMMUNITY): Payer: Self-pay | Admitting: *Deleted

## 2015-10-29 DIAGNOSIS — I5043 Acute on chronic combined systolic (congestive) and diastolic (congestive) heart failure: Secondary | ICD-10-CM | POA: Insufficient documentation

## 2015-10-29 DIAGNOSIS — I5022 Chronic systolic (congestive) heart failure: Secondary | ICD-10-CM | POA: Diagnosis not present

## 2015-11-02 ENCOUNTER — Telehealth (HOSPITAL_COMMUNITY): Payer: Self-pay

## 2015-11-02 NOTE — Telephone Encounter (Signed)
10/29/15 CPX results reviewed with wife. No questions or concerns at this time.  Stanley Wells, [name]

## 2015-11-06 ENCOUNTER — Other Ambulatory Visit (HOSPITAL_COMMUNITY): Payer: Self-pay | Admitting: *Deleted

## 2015-11-06 MED ORDER — ROSUVASTATIN CALCIUM 40 MG PO TABS: 40.0000 mg | ORAL_TABLET | Freq: Every day | ORAL | Status: DC

## 2015-11-07 ENCOUNTER — Other Ambulatory Visit: Payer: Self-pay | Admitting: *Deleted

## 2015-11-07 NOTE — Patient Outreach (Signed)
[facility] Care Management  11/07/2015  Stanley FreibergJames C Wells 07/16/1946 [id number]  Subjective: Telephone call to patient's home number, spoke with wife, states patient not available, left HIPAA compliant message with wife for patient, and requested call back.   Consent on file to speak with wife regarding healthcare needs.   Wife states she is currently on the other line and is unable to talk at this time.  Objective: Per Epic case review: Patient had cardioversion for atrial fibrillation on 10/22/15.   Patient has history of chronic systolic congestive heart failure.  Assessment: Received from 2016 Heart Failure Readmission report on 10/31/15.    3 Admissions and 2 ED visits in the past 6 months.  Plan: RNCM will call patient for 2nd telephone outreach, telephone screen completion, within 3 business days, if no return call.  Annaleigha Woo H. Gardiner Barefootooper RN, BSN, CCM [facility] Care [facility] Telephonic CM Phone: [phone] Fax: [phone]

## 2015-11-08 ENCOUNTER — Other Ambulatory Visit: Payer: Self-pay | Admitting: *Deleted

## 2015-11-08 ENCOUNTER — Encounter: Payer: Self-pay | Admitting: *Deleted

## 2015-11-08 NOTE — Patient Outreach (Signed)
[facility] Care Management  11/08/2015  Stanley Wells 07/13/1946 [id number]  Subjective: Telephone call to patient's home number, spoke with patient, and HIPAA verified.   Patient states he is doing great.   [facility] services and patient agreement to complete telephone screen.   Patient states he has not seen primary MD Dr. Gwendlyn DeutscherJohn Redding II in a long time and has been seeing cardiologist frequently for heart issues.  States he will resume care with primary once heart issues have settled down.   Patient verbalized importance of follow up with primary MD.  Patient states he has not taken flu shot in many years, last taken when he was in the [employer].   Patient decline services due to no care management needs.   Patient in agreement to receive [facility] information.  Objective: Per Epic case review: Patient had cardioversion for atrial fibrillation on 10/22/15. Patient has history of chronic systolic congestive heart failure.  Assessment: Received from 2016 Heart Failure Readmission report on 10/31/15. 3 Admissions and 2 ED visits in the past 6 months.   Telephone screen completed.   [facility] due to no care management needs at this time.   Plan: RNCM will send patient successful outreach letter, [facility] pamphlet, and magnet. RNCM will send case closure letter due to refusal/ no case management needs to patient's primary MD. [facility] will send case closure due to refusal/ no case management needs request to Sherle PoeNicole Robinson at [facility] Care [facility].   Chardonay Scritchfield H. Gardiner Barefootooper RN, BSN, CCM [facility] Care [facility] Telephonic CM Phone: [phone] Fax: [phone]

## 2015-11-30 ENCOUNTER — Ambulatory Visit (HOSPITAL_COMMUNITY)
Admission: RE | Admit: 2015-11-30 | Discharge: 2015-11-30 | Disposition: A | Discharge status: Home / Self Care | Payer: Medicare Other | Source: Ambulatory Visit | Attending: Cardiology | Admitting: Cardiology

## 2015-11-30 ENCOUNTER — Encounter (HOSPITAL_COMMUNITY): Payer: Self-pay

## 2015-11-30 ENCOUNTER — Telehealth (HOSPITAL_COMMUNITY): Payer: Self-pay | Admitting: *Deleted

## 2015-11-30 VITALS — BP 109/57 | HR 61 | Ht 71.0 in | Wt 210.0 lb

## 2015-11-30 DIAGNOSIS — I480 Paroxysmal atrial fibrillation: Secondary | ICD-10-CM | POA: Diagnosis not present

## 2015-11-30 DIAGNOSIS — I481 Persistent atrial fibrillation: Secondary | ICD-10-CM

## 2015-11-30 DIAGNOSIS — I5022 Chronic systolic (congestive) heart failure: Secondary | ICD-10-CM | POA: Insufficient documentation

## 2015-11-30 DIAGNOSIS — Z79899 Other long term (current) drug therapy: Secondary | ICD-10-CM | POA: Insufficient documentation

## 2015-11-30 DIAGNOSIS — I255 Ischemic cardiomyopathy: Secondary | ICD-10-CM | POA: Diagnosis not present

## 2015-11-30 DIAGNOSIS — I110 Hypertensive heart disease with heart failure: Secondary | ICD-10-CM | POA: Insufficient documentation

## 2015-11-30 DIAGNOSIS — Z7901 Long term (current) use of anticoagulants: Secondary | ICD-10-CM | POA: Diagnosis not present

## 2015-11-30 DIAGNOSIS — I340 Nonrheumatic mitral (valve) insufficiency: Secondary | ICD-10-CM

## 2015-11-30 DIAGNOSIS — I2510 Atherosclerotic heart disease of native coronary artery without angina pectoris: Secondary | ICD-10-CM | POA: Insufficient documentation

## 2015-11-30 DIAGNOSIS — Z87891 Personal history of nicotine dependence: Secondary | ICD-10-CM | POA: Insufficient documentation

## 2015-11-30 DIAGNOSIS — I252 Old myocardial infarction: Secondary | ICD-10-CM | POA: Diagnosis not present

## 2015-11-30 DIAGNOSIS — I5042 Chronic combined systolic (congestive) and diastolic (congestive) heart failure: Secondary | ICD-10-CM | POA: Diagnosis not present

## 2015-11-30 DIAGNOSIS — I4819 Other persistent atrial fibrillation: Secondary | ICD-10-CM

## 2015-11-30 DIAGNOSIS — R531 Weakness: Secondary | ICD-10-CM | POA: Diagnosis not present

## 2015-11-30 DIAGNOSIS — G4733 Obstructive sleep apnea (adult) (pediatric): Secondary | ICD-10-CM | POA: Diagnosis not present

## 2015-11-30 DIAGNOSIS — I739 Peripheral vascular disease, unspecified: Secondary | ICD-10-CM

## 2015-11-30 DIAGNOSIS — Z951 Presence of aortocoronary bypass graft: Secondary | ICD-10-CM | POA: Diagnosis not present

## 2015-11-30 DIAGNOSIS — I081 Rheumatic disorders of both mitral and tricuspid valves: Secondary | ICD-10-CM | POA: Diagnosis not present

## 2015-11-30 LAB — COMPREHENSIVE METABOLIC PANEL
ALK PHOS: 58 U/L (ref 38–126)
ALT: 33 U/L (ref 17–63)
ANION GAP: 11 (ref 5–15)
AST: 33 U/L (ref 15–41)
Albumin: 4.3 g/dL (ref 3.5–5.0)
BUN: 34 mg/dL — ABNORMAL HIGH (ref 6–20)
CALCIUM: 9.8 mg/dL (ref 8.9–10.3)
CO2: 21 mmol/L — AB (ref 22–32)
CREATININE: 1.4 mg/dL — AB (ref 0.61–1.24)
Chloride: 107 mmol/L (ref 101–111)
GFR, EST AFRICAN AMERICAN: 58 mL/min — AB (ref >60)
GFR, EST NON AFRICAN AMERICAN: 50 mL/min — AB (ref >60)
Glucose, Bld: 123 mg/dL — ABNORMAL HIGH (ref 65–99)
Potassium: 4.5 mmol/L (ref 3.5–5.1)
SODIUM: 139 mmol/L (ref 135–145)
Total Bilirubin: 1.3 mg/dL — ABNORMAL HIGH (ref 0.3–1.2)
Total Protein: 6.9 g/dL (ref 6.5–8.1)

## 2015-11-30 LAB — CBC
HCT: 42.6 % (ref 39.0–52.0)
HEMOGLOBIN: 14.2 g/dL (ref 13.0–17.0)
MCH: 30.3 pg (ref 26.0–34.0)
MCHC: 33.3 g/dL (ref 30.0–36.0)
MCV: 90.8 fL (ref 78.0–100.0)
PLATELETS: 194 10*3/uL (ref 150–400)
RBC: 4.69 MIL/uL (ref 4.22–5.81)
RDW: 16.6 % — ABNORMAL HIGH (ref 11.5–15.5)
WBC: 10.5 10*3/uL (ref 4.0–10.5)

## 2015-11-30 LAB — TSH: TSH: 2.093 u[IU]/mL (ref 0.350–4.500)

## 2015-11-30 LAB — DIGOXIN LEVEL: DIGOXIN LVL: 0.4 ng/mL — AB (ref 0.8–2.0)

## 2015-11-30 MED ORDER — AMIODARONE HCL 200 MG PO TABS: 200.0000 mg | ORAL_TABLET | Freq: Every day | ORAL | Status: DC

## 2015-11-30 MED ORDER — FUROSEMIDE 40 MG PO TABS: 40.0000 mg | ORAL_TABLET | Freq: Every day | ORAL | Status: DC

## 2015-11-30 MED ORDER — SPIRONOLACTONE 25 MG PO TABS: 25.0000 mg | ORAL_TABLET | Freq: Every day | ORAL | Status: DC

## 2015-11-30 NOTE — Telephone Encounter (Signed)
Notes Recorded by Noralee SpaceHeather M Kameshia Madruga, RN on 11/30/2015 at 1:39 PM Pt's wife aware and agreeable, repeat labs sch 5/5

## 2015-11-30 NOTE — Progress Notes (Signed)
Patient ID: Stanley Wells, male   DOB: 10-Jun-1946, 70 y.o.   MRN: [id number] PCP: Dr. Jeanie Sewer  70 yo with history of CAD, ischemic cardiomyopathy/chronic systolic CHF, severe functional mitral regurgitation, and persistent atrial fibrillation presents for CHF clinic evaluation.  Patient had MI then CABG in 2003.  Cardiac cath in 2015 showed only LIMA-LAD and SVG-OM patent.  In 2015, EF was 30-35%.  He had atrial flutter in 2015 and again in 11/16, both times was cardioverted.  After the 11/16 cardioversion, he stopped all his meds.  He was admitted again in 12/16 with atrial fibrillation/RVR and a CHF exacerbation.  He was diuresed and started on Eliquis with plan for DCCV after 4 weeks of anticoagulation.  He was brought back at the end of December for cardioversion, but this was unsuccessful.  TEE in 12/16 showed EF 20-25% with severe functional MR.    He was seen by Dr [name] with plan for tentative plan for mitral valve repair versus replacement and possible tricuspid valve repair along with MAZE procedure.  He was referred to CHF clinic for optimization prior to surgery.   He went for left and right heart cath. RHC showed good left and right heart filling pressures but cardiac output was low.  Coronary angiography was stable with patent LIMA-LAD and SVG-OM.  Other SVGs occluded.    He had DCCV in 3/17 and is in NSR today.  He was supposed to decreased amiodarone to 200 daily after DCCV but actually stopped it altogether.  He had a CPX in 3/17 with only mild functional impairment from CHF.    Overall, he is doing quite well from a cardiac standpoint.  No exertional dyspnea now and no chest pain. Weight is down another 6 lbs. No orthopnea/PND.  Main limitation is that his legs tire after he walks more than 100 yards or so and he has to slow down.  He has walked up to 4 miles at a time, however.   ECG: NSR, old ASMI, lateral TWIs   Labs (9/16): LDL 126, HDL 38 Labs (12/16): K 4, creatinine  1.15 Labs (1/17): K 4.4, creatinine 1.09, LDL 42, HDL 39 Labs (10/01/2015): K 4.5 Creatinine 1.27 Dig level 0.4   PMH: 1. CAD: s/p MI in 2003.  CABG x 5 in 2003 with LIMA-LAD, SVG-D1, SVG-OM2, seq SVG-PDA/PLV.  LHC/RHC 11/15 with native arteries essentially occluded.  SVG-D1 and seq SVG-PDA/PLV occluded, LIMA and SVG-OM patent.  LHC (1/17) with patent LIMA-LAD, occluded SVG-D, patent SVG-OM => SVG touches down on inferior branch of OM1 that backfills to superior branch of OM1, there are serial 70-80% stenoses in the proximal inferior OM1 branch that may compromise backflow to superior branch OM1, SVG-PDA/PLV totally occluded, EF 25% . 2. Atrial flutter: DCCV 2015.  Recurred 11/16 with DCCV.   3. Chronic systolic CHF: Ischemic cardiomyopathy.  Echo (2015) with moderate-severe MR, EF 30-35%.  TEE (12/16) with severe central MR (functional), EF 20-25% with diffuse hypokinesis, moderately decreased RV systolic function, moderate TR.  RHC (1/17) with mean RA 5, PA 27/12 mean 18, mean PCWP 10, CI 1.7 Fick/1.83 thermo. EF 25% on 1/17 LV-gram.  Cardiac MRI (2/17) with EF 19%, dilated LV, images not adequate to assess delayed enhancement.  - CPX (3/17) with peak VO2 20.4, VE/VCO2 slope 34, RER 1.11 => mild functional impairment.  4. Mitral regurgitation: Severe, suspect functional. 5. OSA: Not using CPAP.  6. HTN 7. Nephrolithiasis 8. Atrial fibrillation: Noted in 12/16 initially.  Paroxysmal.  Had DCCV to NSR in 3/17.  9. PFTs (1/17) with minimal obstructive airways disease but severely decreased DLCO (37% predicted), possibly suggestive of pulmonary vascular disease.   SH: Prior smoker quit 2013, [place] in [place], retired Art gallery managerengineer, married.   FH: No premature CAD.  ROS: All systems reviewed and negative except as per HPI.   Current Outpatient Prescriptions  Medication Sig Dispense Refill  . DIGITEK 125 MCG tablet Take 0.125 mg by mouth daily.   0  . ELIQUIS 5 MG TABS tablet Take 5 mg by mouth  2 (two) times daily.  1  . furosemide (LASIX) 40 MG tablet TAKE 1 TABLET BY MOUTH IN THE MORNING AND 1/2 TABLET IN THE EVENING  0  . isosorbide mononitrate (IMDUR) 30 MG 24 hr tablet Take 1 tablet (30 mg total) by mouth daily. 30 tablet 3  . metoprolol succinate (TOPROL-XL) 50 MG 24 hr tablet Take 50 mg by mouth 2 (two) times daily.   1  . Multiple Vitamins-Minerals (MULTIVITAMIN WITH MINERALS) tablet Take 1 tablet by mouth daily.    . rosuvastatin (CRESTOR) 40 MG tablet Take 1 tablet (40 mg total) by mouth daily. 30 tablet 6  . sacubitril-valsartan (ENTRESTO) 24-26 MG Take 1 tablet by mouth 2 (two) times daily. 60 tablet 3  . spironolactone (ALDACTONE) 25 MG tablet take 1/2 OF A tablet by mouth once daily  0  . amiodarone (PACERONE) 200 MG tablet Take 1 tablet (200 mg total) by mouth daily. 90 tablet 3  . [DISCONTINUED] amLODipine (NORVASC) 5 MG tablet Take 1 tablet (5 mg total) by mouth daily. 30 tablet 6   No current facility-administered medications for this encounter.   BP 109/57 mmHg  Pulse 61  Ht 5\' 11"  (1.803 m)  Wt 210 lb (95.255 kg)  BMI 29.30 kg/m2  SpO2 97% General: NAD Neck: JVP not elevated, no thyromegaly or thyroid nodule.  Lungs: Decreased breath sounds bilaterally.  CV: Nondisplaced PMI.  Heart sounds quiet, irregular S1/S2, no S3/S4, 1/6 HSM apex.  No peripheral edema.  No carotid bruit.  2+ PT on right, unable to palpate PT on left.   Abdomen: Soft, nontender, no hepatosplenomegaly, no distention.  Skin: Intact without lesions or rashes.  Neurologic: Alert and oriented x 3.  Psych: Normal affect. Extremities: No clubbing or cyanosis.  HEENT: Normal.   Assessment/Plan: 1. Chronic systolic CHF: EF 19-14%20-25% on TEE from 12/16.  cMRI 2/17 with EF 19%, unable to assess for delayed enhancement on this study.  Ischemic cardiomyopathy.  Low cardiac output by RHC in 1/17 but filling pressures optimized.  Despite low output on RHC, he has been doing quite well with medical  treatment.  CPX with only mild functional limitation.  NYHA class II.  Volume looks ok on exam today.   - Continue current Lasix 40 qam/20 qpm.  BMET today.  - Increase spironolactone to 25 daily. BMET in 10 days.  - Continue digoxin, check level.   - Continue Toprol XL 50 mg twice a day .  - Continue entresto 24/26 bid. - Will get echo before next appt.  If EF remains low, would be ICD candidate.  Narrow QRS so no CRT.   2. CAD: s/p CABG.  On 1/17 cath, only LIMA-LAD and SVG-OM2 still patent.  I do not think that he would derive much benefit from attempting redo CABG.   - No ASA given use of apixaban.  - Continue Crestor, good lipids 1/17.  3. Atrial fibrillation: Paroxysmal.  Successful  DCCV in 3/17 on amiodarone.   - In NSR today, but I think to hold NSR long-term, he needs to restart amiodarone at 200 mg daily.  Will check LFTs and TSH today.  - Continue apixaban.  4. Mitral regurgitation: Severe by prior echo, probably functional.  Did not look as impressive on MRI and murmur today is not loud.  He is at moderately high risk for complications with surgery.  He saw Dr [name] => given symptomatic improvement and the functional nature of his mitral regurgitation, we are holding off on MV replacement for the time being and will see how he does with medical treatment.  5. Tricuspid regurgitation: Moderate.   6. Leg weakness/tiredness: I cannot palpate pulses in his right foot.  Will get peripheral arterial dopplers.   Follow up  6 weeks.   Marca Ancona 11/30/2015

## 2015-11-30 NOTE — Progress Notes (Signed)
Advanced Heart Failure Medication Review by a Pharmacist  Does the patient  feel that his/her medications are working for him/her?  yes  Has the patient been experiencing any side effects to the medications prescribed?  no  Does the patient measure his/her own blood pressure or blood glucose at home?  yes   Does the patient have any problems obtaining medications due to transportation or finances?   No PAN for Entresto  Understanding of regimen: good Understanding of indications: good Potential of compliance: good Patient understands to avoid NSAIDs. Patient understands to avoid decongestants.  Issues to address at subsequent visits: None   Pharmacist comments:  [name] is a pleasant 70 yo M presenting with his wife and without a medication list but with good recall of his regimen including dosages. He reports good compliance with his medications. He did express concern with the cost of Eliquis and I explained to them that through BMS they would be eligible for free medication once they have spent at least 3% of their annual income on Rx medications. They did not have any specific medication-related questions or concerns for me at this time.   Tyler DeisErika K. Bonnye FavaNicolsen, PharmD, BCPS, CPP Clinical Pharmacist Pager: [phone] Phone: [phone] 11/30/2015 8:58 AM    Time with patient: 10 minutes Preparation and documentation time: 2 minutes Total time: 12 minutes

## 2015-11-30 NOTE — Telephone Encounter (Signed)
-----   Message from Laurey Moralealton S McLean, MD sent at 11/30/2015  9:46 AM EDT ----- Decrease Lasix to 40 mg once daily since creatinine is a little higher.  Repeat BMET in 1 week.

## 2015-11-30 NOTE — Patient Instructions (Addendum)
Start Amiodarone 200 mg daily  Increase Spironolactone to 25 mg (1 tab) daily  Labs today  Your physician has requested that you have an echocardiogram. Echocardiography is a painless test that uses sound waves to create images of your heart. It provides your doctor with information about the size and shape of your heart and how well your heart's chambers and valves are working. This procedure takes approximately one hour. There are no restrictions for this procedure.  Your physician has requested that you have a lower extremity arterial exercise duplex. During this test, exercise and ultrasound are used to evaluate arterial blood flow in the legs. Allow one hour for this exam. There are no restrictions or special instructions.  Your physician recommends that you schedule a follow-up appointment in: 6 weeks

## 2015-12-04 ENCOUNTER — Other Ambulatory Visit (HOSPITAL_COMMUNITY): Payer: Self-pay | Admitting: Cardiology

## 2015-12-04 DIAGNOSIS — I739 Peripheral vascular disease, unspecified: Secondary | ICD-10-CM

## 2015-12-07 ENCOUNTER — Ambulatory Visit (HOSPITAL_COMMUNITY)
Admission: RE | Admit: 2015-12-07 | Discharge: 2015-12-07 | Disposition: A | Discharge status: Home / Self Care | Payer: Medicare Other | Source: Ambulatory Visit | Attending: Cardiology | Admitting: Cardiology

## 2015-12-07 DIAGNOSIS — I5042 Chronic combined systolic (congestive) and diastolic (congestive) heart failure: Secondary | ICD-10-CM | POA: Diagnosis not present

## 2015-12-07 LAB — BASIC METABOLIC PANEL
Anion gap: 9 (ref 5–15)
BUN: 35 mg/dL — AB (ref 6–20)
CALCIUM: 9.6 mg/dL (ref 8.9–10.3)
CHLORIDE: 111 mmol/L (ref 101–111)
CO2: 20 mmol/L — AB (ref 22–32)
CREATININE: 1.36 mg/dL — AB (ref 0.61–1.24)
GFR calc Af Amer: 60 mL/min — ABNORMAL LOW (ref >60)
GFR calc non Af Amer: 52 mL/min — ABNORMAL LOW (ref >60)
GLUCOSE: 120 mg/dL — AB (ref 65–99)
Potassium: 4.5 mmol/L (ref 3.5–5.1)
Sodium: 140 mmol/L (ref 135–145)

## 2015-12-10 DIAGNOSIS — H10813 Pingueculitis, bilateral: Secondary | ICD-10-CM | POA: Diagnosis not present

## 2015-12-11 ENCOUNTER — Ambulatory Visit (HOSPITAL_COMMUNITY)
Admission: RE | Admit: 2015-12-11 | Discharge: 2015-12-11 | Disposition: A | Discharge status: Home / Self Care | Payer: Medicare Other | Source: Ambulatory Visit | Attending: Cardiovascular Disease | Admitting: Cardiovascular Disease

## 2015-12-11 DIAGNOSIS — I110 Hypertensive heart disease with heart failure: Secondary | ICD-10-CM | POA: Diagnosis not present

## 2015-12-11 DIAGNOSIS — E785 Hyperlipidemia, unspecified: Secondary | ICD-10-CM | POA: Insufficient documentation

## 2015-12-11 DIAGNOSIS — G4733 Obstructive sleep apnea (adult) (pediatric): Secondary | ICD-10-CM | POA: Diagnosis not present

## 2015-12-11 DIAGNOSIS — I5042 Chronic combined systolic (congestive) and diastolic (congestive) heart failure: Secondary | ICD-10-CM | POA: Diagnosis not present

## 2015-12-11 DIAGNOSIS — I739 Peripheral vascular disease, unspecified: Secondary | ICD-10-CM | POA: Insufficient documentation

## 2015-12-11 DIAGNOSIS — I2510 Atherosclerotic heart disease of native coronary artery without angina pectoris: Secondary | ICD-10-CM | POA: Diagnosis not present

## 2015-12-17 ENCOUNTER — Ambulatory Visit (HOSPITAL_COMMUNITY): Payer: Medicare Other | Attending: Cardiovascular Disease

## 2015-12-17 ENCOUNTER — Other Ambulatory Visit: Payer: Self-pay

## 2015-12-17 DIAGNOSIS — I2510 Atherosclerotic heart disease of native coronary artery without angina pectoris: Secondary | ICD-10-CM | POA: Insufficient documentation

## 2015-12-17 DIAGNOSIS — I5042 Chronic combined systolic (congestive) and diastolic (congestive) heart failure: Secondary | ICD-10-CM | POA: Insufficient documentation

## 2015-12-17 DIAGNOSIS — I110 Hypertensive heart disease with heart failure: Secondary | ICD-10-CM | POA: Diagnosis not present

## 2015-12-17 DIAGNOSIS — I371 Nonrheumatic pulmonary valve insufficiency: Secondary | ICD-10-CM | POA: Insufficient documentation

## 2015-12-17 DIAGNOSIS — I429 Cardiomyopathy, unspecified: Secondary | ICD-10-CM | POA: Diagnosis not present

## 2015-12-17 DIAGNOSIS — I340 Nonrheumatic mitral (valve) insufficiency: Secondary | ICD-10-CM | POA: Insufficient documentation

## 2015-12-17 DIAGNOSIS — G4733 Obstructive sleep apnea (adult) (pediatric): Secondary | ICD-10-CM | POA: Insufficient documentation

## 2015-12-17 DIAGNOSIS — I509 Heart failure, unspecified: Secondary | ICD-10-CM | POA: Diagnosis present

## 2015-12-17 DIAGNOSIS — I071 Rheumatic tricuspid insufficiency: Secondary | ICD-10-CM | POA: Insufficient documentation

## 2015-12-24 ENCOUNTER — Encounter: Payer: Medicare Other | Admitting: Thoracic Surgery (Cardiothoracic Vascular Surgery)

## 2016-01-10 ENCOUNTER — Telehealth (HOSPITAL_COMMUNITY): Payer: Self-pay | Admitting: Cardiology

## 2016-01-10 ENCOUNTER — Ambulatory Visit (HOSPITAL_COMMUNITY)
Admission: RE | Admit: 2016-01-10 | Discharge: 2016-01-10 | Disposition: A | Discharge status: Home / Self Care | Payer: Medicare Other | Source: Ambulatory Visit | Attending: Cardiology | Admitting: Cardiology

## 2016-01-10 VITALS — BP 118/64 | HR 62 | Wt 207.5 lb

## 2016-01-10 DIAGNOSIS — I110 Hypertensive heart disease with heart failure: Secondary | ICD-10-CM | POA: Diagnosis not present

## 2016-01-10 DIAGNOSIS — Z87891 Personal history of nicotine dependence: Secondary | ICD-10-CM | POA: Diagnosis not present

## 2016-01-10 DIAGNOSIS — G4733 Obstructive sleep apnea (adult) (pediatric): Secondary | ICD-10-CM | POA: Insufficient documentation

## 2016-01-10 DIAGNOSIS — I255 Ischemic cardiomyopathy: Secondary | ICD-10-CM | POA: Diagnosis not present

## 2016-01-10 DIAGNOSIS — I2510 Atherosclerotic heart disease of native coronary artery without angina pectoris: Secondary | ICD-10-CM | POA: Diagnosis not present

## 2016-01-10 DIAGNOSIS — I5042 Chronic combined systolic (congestive) and diastolic (congestive) heart failure: Secondary | ICD-10-CM | POA: Diagnosis not present

## 2016-01-10 DIAGNOSIS — I340 Nonrheumatic mitral (valve) insufficiency: Secondary | ICD-10-CM | POA: Diagnosis not present

## 2016-01-10 DIAGNOSIS — I252 Old myocardial infarction: Secondary | ICD-10-CM | POA: Diagnosis not present

## 2016-01-10 DIAGNOSIS — Z7901 Long term (current) use of anticoagulants: Secondary | ICD-10-CM | POA: Diagnosis not present

## 2016-01-10 DIAGNOSIS — I480 Paroxysmal atrial fibrillation: Secondary | ICD-10-CM | POA: Insufficient documentation

## 2016-01-10 DIAGNOSIS — Z951 Presence of aortocoronary bypass graft: Secondary | ICD-10-CM | POA: Insufficient documentation

## 2016-01-10 DIAGNOSIS — Z79899 Other long term (current) drug therapy: Secondary | ICD-10-CM | POA: Insufficient documentation

## 2016-01-10 LAB — COMPREHENSIVE METABOLIC PANEL
ALT: 39 U/L (ref 17–63)
ANION GAP: 8 (ref 5–15)
AST: 29 U/L (ref 15–41)
Albumin: 4.3 g/dL (ref 3.5–5.0)
Alkaline Phosphatase: 57 U/L (ref 38–126)
BILIRUBIN TOTAL: 0.9 mg/dL (ref 0.3–1.2)
BUN: 44 mg/dL — AB (ref 6–20)
CALCIUM: 9.7 mg/dL (ref 8.9–10.3)
CHLORIDE: 107 mmol/L (ref 101–111)
CO2: 21 mmol/L — ABNORMAL LOW (ref 22–32)
Creatinine, Ser: 1.88 mg/dL — ABNORMAL HIGH (ref 0.61–1.24)
GFR calc Af Amer: 40 mL/min — ABNORMAL LOW (ref >60)
GFR, EST NON AFRICAN AMERICAN: 35 mL/min — AB (ref >60)
Glucose, Bld: 119 mg/dL — ABNORMAL HIGH (ref 65–99)
POTASSIUM: 5 mmol/L (ref 3.5–5.1)
Sodium: 136 mmol/L (ref 135–145)
TOTAL PROTEIN: 6.8 g/dL (ref 6.5–8.1)

## 2016-01-10 LAB — CBC
HCT: 40.7 % (ref 39.0–52.0)
Hemoglobin: 13.4 g/dL (ref 13.0–17.0)
MCH: 30.5 pg (ref 26.0–34.0)
MCHC: 32.9 g/dL (ref 30.0–36.0)
MCV: 92.5 fL (ref 78.0–100.0)
PLATELETS: 207 10*3/uL (ref 150–400)
RBC: 4.4 MIL/uL (ref 4.22–5.81)
RDW: 14.6 % (ref 11.5–15.5)
WBC: 13 10*3/uL — AB (ref 4.0–10.5)

## 2016-01-10 LAB — LIPID PANEL
CHOLESTEROL: 138 mg/dL (ref 0–200)
HDL: 39 mg/dL — ABNORMAL LOW (ref >40)
LDL Cholesterol: 54 mg/dL (ref 0–99)
TRIGLYCERIDES: 224 mg/dL — AB (ref <150)
Total CHOL/HDL Ratio: 3.5 RATIO
VLDL: 45 mg/dL — AB (ref 0–40)

## 2016-01-10 LAB — DIGOXIN LEVEL: DIGOXIN LVL: 0.8 ng/mL (ref 0.8–2.0)

## 2016-01-10 LAB — TSH: TSH: 3.061 u[IU]/mL (ref 0.350–4.500)

## 2016-01-10 LAB — BRAIN NATRIURETIC PEPTIDE: B NATRIURETIC PEPTIDE 5: 87.9 pg/mL (ref 0.0–100.0)

## 2016-01-10 MED ORDER — FUROSEMIDE 20 MG PO TABS: 40.0000 mg | ORAL_TABLET | ORAL | Status: DC | PRN

## 2016-01-10 MED ORDER — SPIRONOLACTONE 25 MG PO TABS: 12.5000 mg | ORAL_TABLET | Freq: Every day | ORAL | Status: DC

## 2016-01-10 MED ORDER — METOPROLOL SUCCINATE ER 25 MG PO TB24: 75.0000 mg | ORAL_TABLET | Freq: Two times a day (BID) | ORAL | Status: DC

## 2016-01-10 MED ORDER — FUROSEMIDE 20 MG PO TABS: 20.0000 mg | ORAL_TABLET | Freq: Every day | ORAL | Status: DC

## 2016-01-10 NOTE — Progress Notes (Signed)
Patient ID: Stanley Wells, male   DOB: 05/17/1946, 70 y.o.   MRN: [id number] PCP: Dr. Jeanie Seweredding  70 yo with history of CAD, ischemic cardiomyopathy/chronic systolic CHF, severe functional mitral regurgitation, and persistent atrial fibrillation presents for CHF clinic evaluation.  Patient had MI then CABG in 2003.  Cardiac cath in 2015 showed only LIMA-LAD and SVG-OM patent.  In 2015, EF was 30-35%.  He had atrial flutter in 2015 and again in 11/16, both times was cardioverted.  After the 11/16 cardioversion, he stopped all his meds.  He was admitted again in 12/16 with atrial fibrillation/RVR and a CHF exacerbation.  He was diuresed and started on Eliquis with plan for DCCV after 4 weeks of anticoagulation.  He was brought back at the end of December for cardioversion, but this was unsuccessful.  TEE in 12/16 showed EF 20-25% with severe functional MR.    He was seen by Dr Cornelius Moraswen with plan for tentative plan for mitral valve repair versus replacement and possible tricuspid valve repair along with MAZE procedure.  He was referred to CHF clinic for optimization prior to surgery.   He went for left and right heart cath. RHC showed good left and right heart filling pressures but cardiac output was low.  Coronary angiography was stable with patent LIMA-LAD and SVG-OM.  Other SVGs occluded.    He had DCCV in 3/17 and is in NSR today on amiodarone.  He had a CPX in 3/17 with only mild functional impairment from CHF.    Overall, he is doing quite well from a cardiac standpoint.  No exertional dyspnea now and no chest pain. Recently painted his house and was up and down ladders with no problems.  Weight is down another 3 lbs. No orthopnea/PND.  His most recent echo in 5/17 showed improvement in EF to 40-45% with only trivial MR.    Labs (9/16): LDL 126, HDL 38 Labs (12/16): K 4, creatinine 1.15 Labs (1/17): K 4.4, creatinine 1.09, LDL 42, HDL 39 Labs (10/01/2015): K 4.5 Creatinine 1.27 Dig level 0.4  Labs  (4/17): digoxin 0.4, LFTs normal, TSH normal Labs (5/17): K 4.5, creatinine 1.36  PMH: 1. CAD: s/p MI in 2003.  CABG x 5 in 2003 with LIMA-LAD, SVG-D1, SVG-OM2, seq SVG-PDA/PLV.  LHC/RHC 11/15 with native arteries essentially occluded.  SVG-D1 and seq SVG-PDA/PLV occluded, LIMA and SVG-OM patent.  LHC (1/17) with patent LIMA-LAD, occluded SVG-D, patent SVG-OM => SVG touches down on inferior branch of OM1 that backfills to superior branch of OM1, there are serial 70-80% stenoses in the proximal inferior OM1 branch that may compromise backflow to superior branch OM1, SVG-PDA/PLV totally occluded, EF 25% . 2. Atrial flutter: DCCV 2015.  Recurred 11/16 with DCCV.   3. Chronic systolic CHF: Ischemic cardiomyopathy.  Echo (2015) with moderate-severe MR, EF 30-35%.  TEE (12/16) with severe central MR (functional), EF 20-25% with diffuse hypokinesis, moderately decreased RV systolic function, moderate TR.  RHC (1/17) with mean RA 5, PA 27/12 mean 18, mean PCWP 10, CI 1.7 Fick/1.83 thermo. EF 25% on 1/17 LV-gram.  Cardiac MRI (2/17) with EF 19%, dilated LV, images not adequate to assess delayed enhancement.  - CPX (3/17) with peak VO2 20.4, VE/VCO2 slope 34, RER 1.11 => mild functional impairment.  - Echo (5/17) with EF 40-45%, trivial MR, mild RV dilation with low normal RV systolic function.  4. Mitral regurgitation: Severe on 12/16 TEE, but was only trivial on echo in 5/17 with aggressive medical treatment.  5. OSA: Not using CPAP.  6. HTN 7. Nephrolithiasis 8. Atrial fibrillation: Noted in 12/16 initially.  Paroxysmal.  Had DCCV to NSR in 3/17.  9. PFTs (1/17) with minimal obstructive airways disease but severely decreased DLCO (37% predicted), possibly suggestive of pulmonary vascular disease.  10. ABIs (5/17) were normal.   SH: Prior smoker quit 2013, lives in [place], retired Art gallery manager, married.   FH: No premature CAD.  ROS: All systems reviewed and negative except as per HPI.   Current  Outpatient Prescriptions  Medication Sig Dispense Refill  . amiodarone (PACERONE) 200 MG tablet Take 1 tablet (200 mg total) by mouth daily. 90 tablet 3  . DIGITEK 125 MCG tablet Take 0.125 mg by mouth daily.   0  . ELIQUIS 5 MG TABS tablet Take 5 mg by mouth 2 (two) times daily.  1  . isosorbide mononitrate (IMDUR) 30 MG 24 hr tablet Take 1 tablet (30 mg total) by mouth daily. 30 tablet 3  . metoprolol succinate (TOPROL-XL) 25 MG 24 hr tablet Take 3 tablets (75 mg total) by mouth 2 (two) times daily. 180 tablet 6  . Multiple Vitamins-Minerals (MULTIVITAMIN WITH MINERALS) tablet Take 1 tablet by mouth daily.    . rosuvastatin (CRESTOR) 40 MG tablet Take 1 tablet (40 mg total) by mouth daily. 30 tablet 6  . sacubitril-valsartan (ENTRESTO) 24-26 MG Take 1 tablet by mouth 2 (two) times daily. 60 tablet 3  . furosemide (LASIX) 20 MG tablet Take 2 tablets (40 mg total) by mouth as needed (for 3 lb weight gain over night or 5 lb weight gain in a week). 90 tablet 6  . spironolactone (ALDACTONE) 25 MG tablet Take 0.5 tablets (12.5 mg total) by mouth daily. 15 tablet 6  . [DISCONTINUED] amLODipine (NORVASC) 5 MG tablet Take 1 tablet (5 mg total) by mouth daily. 30 tablet 6   No current facility-administered medications for this encounter.   BP 118/64 mmHg  Pulse 62  Wt 207 lb 8 oz (94.121 kg)  SpO2 98% General: NAD Neck: JVP not elevated, no thyromegaly or thyroid nodule.  Lungs: Decreased breath sounds bilaterally.  CV: Nondisplaced PMI.  Heart sounds quiet, irregular S1/S2, no S3/S4, no murmur.  No peripheral edema.  No carotid bruit.  2+ PT on right, unable to palpate PT on left.   Abdomen: Soft, nontender, no hepatosplenomegaly, no distention.  Skin: Intact without lesions or rashes.  Neurologic: Alert and oriented x 3.  Psych: Normal affect. Extremities: No clubbing or cyanosis.  HEENT: Normal.   Assessment/Plan: 1. Chronic systolic CHF: EF 81-19% on TEE from 12/16.  cMRI 2/17 with EF  19%, unable to assess for delayed enhancement on this study.  Ischemic cardiomyopathy.  Low cardiac output by RHC in 1/17 but filling pressures optimized.  Despite low output on RHC, he has been doing quite well with medical treatment.  CPX with only mild functional limitation.  Most recent echo in 5/17 showed improvement in EF to 40-45%, now outside of ICD range.  NYHA class II.  Volume looks ok on exam today.   - I think he can decrease Lasix to 20 mg daily.   - Continue current Entresto and spironolactone. - Increase Toprol XL to 75 mg bid.   - Continue digoxin, check level.   - BMET today.  2. CAD: s/p CABG.  On 1/17 cath, only LIMA-LAD and SVG-OM2 still patent.  I do not think that he would derive much benefit from attempting redo CABG.   -  No ASA given use of apixaban.  - Continue Crestor, good lipids 1/17.  3. Atrial fibrillation: Paroxysmal.  Successful DCCV in 3/17 on amiodarone.  In NSR today. - Continue amiodarone.  Will check LFTs and TSH today.  He will need regular eye exams.  - Continue apixaban. CBC today.  4. Mitral regurgitation: Severe by 12/16 TEE, probably functional.  Much improved on 5/17 echo, only trivial.  Follow up 3 months.   Marca Ancona 01/10/2016

## 2016-01-10 NOTE — Telephone Encounter (Signed)
patinets wife aware and voiced understanding

## 2016-01-10 NOTE — Patient Instructions (Signed)
INCREASE Metoprolol to 75 mg (3 tabs) twice daily. CANNOT split your current tablets, must pick up new Rx at your pharmacy.  DECREASE Lasix to 20 mg once daily. CAN split current pills to take 1.2 tab once daily, new Rx will be correct dose 20 mg tabs.  Routine lab work today. Will notify you of abnormal results, otherwise no news is good news!  Follow up 3 months with Dr. Shirlee LatchMcLean.  Do the following things EVERYDAY: 1) Weigh yourself in the morning before breakfast. Write it down and keep it in a log. 2) Take your medicines as prescribed 3) Eat low salt foods-Limit salt (sodium) to 2000 mg per day.  4) Stay as active as you can everyday 5) Limit all fluids for the day to less than 2 liters

## 2016-01-11 ENCOUNTER — Encounter (HOSPITAL_COMMUNITY): Payer: Medicare Other

## 2016-01-14 ENCOUNTER — Other Ambulatory Visit (HOSPITAL_COMMUNITY): Payer: Self-pay | Admitting: *Deleted

## 2016-01-14 MED ORDER — SACUBITRIL-VALSARTAN 24-26 MG PO TABS: 1.0000 | ORAL_TABLET | Freq: Two times a day (BID) | ORAL | Status: DC

## 2016-02-25 ENCOUNTER — Other Ambulatory Visit (HOSPITAL_COMMUNITY): Payer: Self-pay | Admitting: *Deleted

## 2016-02-25 MED ORDER — ISOSORBIDE MONONITRATE ER 30 MG PO TB24: 30.0000 mg | ORAL_TABLET | Freq: Every day | ORAL | 3 refills | Status: DC

## 2016-03-22 ENCOUNTER — Other Ambulatory Visit: Payer: Self-pay | Admitting: Cardiology

## 2016-04-11 ENCOUNTER — Ambulatory Visit (HOSPITAL_COMMUNITY)
Admission: RE | Admit: 2016-04-11 | Discharge: 2016-04-11 | Disposition: A | Discharge status: Home / Self Care | Payer: Medicare Other | Source: Ambulatory Visit | Attending: Cardiology | Admitting: Cardiology

## 2016-04-11 VITALS — BP 140/70 | HR 45 | Wt 213.0 lb

## 2016-04-11 DIAGNOSIS — E785 Hyperlipidemia, unspecified: Secondary | ICD-10-CM | POA: Diagnosis not present

## 2016-04-11 DIAGNOSIS — I5042 Chronic combined systolic (congestive) and diastolic (congestive) heart failure: Secondary | ICD-10-CM

## 2016-04-11 DIAGNOSIS — I5022 Chronic systolic (congestive) heart failure: Secondary | ICD-10-CM | POA: Diagnosis not present

## 2016-04-11 DIAGNOSIS — I340 Nonrheumatic mitral (valve) insufficiency: Secondary | ICD-10-CM | POA: Diagnosis not present

## 2016-04-11 DIAGNOSIS — Z951 Presence of aortocoronary bypass graft: Secondary | ICD-10-CM | POA: Diagnosis not present

## 2016-04-11 DIAGNOSIS — I2510 Atherosclerotic heart disease of native coronary artery without angina pectoris: Secondary | ICD-10-CM | POA: Insufficient documentation

## 2016-04-11 DIAGNOSIS — I4891 Unspecified atrial fibrillation: Secondary | ICD-10-CM | POA: Diagnosis not present

## 2016-04-11 DIAGNOSIS — I130 Hypertensive heart and chronic kidney disease with heart failure and stage 1 through stage 4 chronic kidney disease, or unspecified chronic kidney disease: Secondary | ICD-10-CM | POA: Diagnosis not present

## 2016-04-11 DIAGNOSIS — I480 Paroxysmal atrial fibrillation: Secondary | ICD-10-CM | POA: Insufficient documentation

## 2016-04-11 DIAGNOSIS — I255 Ischemic cardiomyopathy: Secondary | ICD-10-CM | POA: Diagnosis not present

## 2016-04-11 LAB — COMPREHENSIVE METABOLIC PANEL
ALBUMIN: 4.2 g/dL (ref 3.5–5.0)
ALT: 35 U/L (ref 17–63)
AST: 28 U/L (ref 15–41)
Alkaline Phosphatase: 57 U/L (ref 38–126)
Anion gap: 5 (ref 5–15)
BUN: 22 mg/dL — AB (ref 6–20)
CHLORIDE: 106 mmol/L (ref 101–111)
CO2: 26 mmol/L (ref 22–32)
Calcium: 9.5 mg/dL (ref 8.9–10.3)
Creatinine, Ser: 1.36 mg/dL — ABNORMAL HIGH (ref 0.61–1.24)
GFR calc non Af Amer: 52 mL/min — ABNORMAL LOW (ref >60)
GFR, EST AFRICAN AMERICAN: 60 mL/min — AB (ref >60)
GLUCOSE: 119 mg/dL — AB (ref 65–99)
Potassium: 5.2 mmol/L — ABNORMAL HIGH (ref 3.5–5.1)
SODIUM: 137 mmol/L (ref 135–145)
TOTAL PROTEIN: 6.9 g/dL (ref 6.5–8.1)
Total Bilirubin: 0.8 mg/dL (ref 0.3–1.2)

## 2016-04-11 LAB — CBC
HEMATOCRIT: 43.9 % (ref 39.0–52.0)
HEMOGLOBIN: 14.1 g/dL (ref 13.0–17.0)
MCH: 31.5 pg (ref 26.0–34.0)
MCHC: 32.1 g/dL (ref 30.0–36.0)
MCV: 98 fL (ref 78.0–100.0)
Platelets: 215 10*3/uL (ref 150–400)
RBC: 4.48 MIL/uL (ref 4.22–5.81)
RDW: 13.9 % (ref 11.5–15.5)
WBC: 12.5 10*3/uL — AB (ref 4.0–10.5)

## 2016-04-11 LAB — TSH: TSH: 3.392 u[IU]/mL (ref 0.350–4.500)

## 2016-04-11 LAB — DIGOXIN LEVEL: DIGOXIN LVL: 0.6 ng/mL — AB (ref 0.8–2.0)

## 2016-04-11 MED ORDER — METOPROLOL SUCCINATE ER 25 MG PO TB24: ORAL_TABLET | ORAL | 6 refills | Status: DC

## 2016-04-11 MED ORDER — MAGNESIUM OXIDE -MG SUPPLEMENT 200 MG PO TABS: 200.0000 mg | ORAL_TABLET | Freq: Every day | ORAL | 0 refills | Status: DC

## 2016-04-11 MED ORDER — COENZYME Q10 200 MG PO CAPS
200.0000 mg | ORAL_CAPSULE | Freq: Every day | ORAL | Status: DC
Start: 2016-04-11 — End: 2016-07-14

## 2016-04-11 NOTE — Patient Instructions (Addendum)
Stop Amiodarone   Decrease Metoprolol to 50 mg in AM and 75 mg in PM  Labs today  Your physician recommends that you schedule a follow-up appointment in: 3 months  Start Mag-ox 200 mg daily (over the counter) Start CoQ10 200 mg daily (over the counter) Take Crestor 40 mg daily  AFTER 2 WEEKS:  IF NOT BETTER STOP CRESTOR   If better off Crestor then Start Atorvastatin (Lipitor) 80 mg daily   If NOT better off Crestor then resume Crestor  CALL US TO LET US KNOW WHAT YOU END UP TAKING

## 2016-04-11 NOTE — Progress Notes (Signed)
Advanced Heart Failure Medication Review by a Pharmacist  Does the patient  feel that his/her medications are working for him/her?  yes  Has the patient been experiencing any side effects to the medications prescribed?  Yes - thigh muscle "spasm" since starting Crestor  Does the patient measure his/her own blood pressure or blood glucose at home?  yes   Does the patient have any problems obtaining medications due to transportation or finances?   no  Understanding of regimen: good Understanding of indications: good Potential of compliance: good Patient understands to avoid NSAIDs. Patient understands to avoid decongestants.  Issues to address at subsequent visits: None   Pharmacist comments:  Stanley Wells is a pleasant 70 yo M presenting with his wife and a current medication list. He reports good compliance with his regimen but does state that he has been having bilateral posterior thigh muscle "spasms" since starting Crestor. He states that he was switched from Lipitor for an unknown reason but did not have these pains with it. In a previous note from Dr. Anne FuSkains, he was switched to Crestor for myalgias but apparently patient doesn't remember having these side effects.   Tyler DeisErika K. Bonnye FavaNicolsen, PharmD, BCPS, CPP Clinical Pharmacist Pager: [phone] Phone: [phone] 04/11/2016 9:25 AM      Time with patient: 10 minutes Preparation and documentation time: 2 minutes Total time: 12 minutes

## 2016-04-12 NOTE — Progress Notes (Signed)
Patient ID: Stanley Wells, male   DOB: 06-10-1946, 70 y.o.   MRN: [id number] PCP: Dr. Jeanie Sewer  70 yo with history of CAD, ischemic cardiomyopathy/chronic systolic CHF, severe functional mitral regurgitation, and persistent atrial fibrillation presents for CHF clinic evaluation.  Patient had MI then CABG in 2003.  Cardiac cath in 2015 showed only LIMA-LAD and SVG-OM patent.  In 2015, EF was 30-35%.  He had atrial flutter in 2015 and again in 11/16, both times was cardioverted.  After the 11/16 cardioversion, he stopped all his meds.  He was admitted again in 12/16 with atrial fibrillation/RVR and a CHF exacerbation.  He was diuresed and started on Eliquis with plan for DCCV after 4 weeks of anticoagulation.  He was brought back at the end of December for cardioversion, but this was unsuccessful.  TEE in 12/16 showed EF 20-25% with severe functional MR.    He was seen by Dr [name] with plan for tentative plan for mitral valve repair versus replacement and possible tricuspid valve repair along with MAZE procedure.  He was referred to CHF clinic for optimization prior to surgery.   He went for left and right heart cath. RHC showed good left and right heart filling pressures but cardiac output was low.  Coronary angiography was stable with patent LIMA-LAD and SVG-OM.  Other SVGs occluded.    He had DCCV in 3/17 and is in NSR today on amiodarone.  He had a CPX in 3/17 with only mild functional impairment from CHF.   His most recent echo in 5/17 showed improvement in EF to 40-45% with only trivial MR.    Main complaint today is cramping in his buttocks and thighs.  It tends to be momentary but can be quite severe.  Not related to activity.  No chest pain or exertional dyspnea.  Does some swimming and walking for exercise, tends to note more cramping when he swims. No BRBPR or melena. No palpitations.  Noted to be bradycardic.   ECG: NSR at 42, nonspecific T wave flattening  Labs (9/16): LDL 126, HDL 38 Labs  (12/16): K 4, creatinine 1.15 Labs (1/17): K 4.4, creatinine 1.09, LDL 42, HDL 39 Labs (10/01/2015): K 4.5 Creatinine 1.27 Dig level 0.4  Labs (4/17): digoxin 0.4, LFTs normal, TSH normal Labs (5/17): K 4.5, creatinine 1.36 Labs (6/17): K 5, creatinine 1.88, BNP 88, LDL 54, HDl 39, digoxin 0.8, LFTs normal, TSH normal  PMH: 1. CAD: s/p MI in 2003.  CABG x 5 in 2003 with LIMA-LAD, SVG-D1, SVG-OM2, seq SVG-PDA/PLV.  LHC/RHC 11/15 with native arteries essentially occluded.  SVG-D1 and seq SVG-PDA/PLV occluded, LIMA and SVG-OM patent.  LHC (1/17) with patent LIMA-LAD, occluded SVG-D, patent SVG-OM => SVG touches down on inferior branch of OM1 that backfills to superior branch of OM1, there are serial 70-80% stenoses in the proximal inferior OM1 branch that may compromise backflow to superior branch OM1, SVG-PDA/PLV totally occluded, EF 25% . 2. Atrial flutter: DCCV 2015.  Recurred 11/16 with DCCV.   3. Chronic systolic CHF: Ischemic cardiomyopathy.  Echo (2015) with moderate-severe MR, EF 30-35%.  TEE (12/16) with severe central MR (functional), EF 20-25% with diffuse hypokinesis, moderately decreased RV systolic function, moderate TR.  RHC (1/17) with mean RA 5, PA 27/12 mean 18, mean PCWP 10, CI 1.7 Fick/1.83 thermo. EF 25% on 1/17 LV-gram.  Cardiac MRI (2/17) with EF 19%, dilated LV, images not adequate to assess delayed enhancement.  - CPX (3/17) with peak VO2 20.4, VE/VCO2 slope  34, RER 1.11 => mild functional impairment.  - Echo (5/17) with EF 40-45%, trivial MR, mild RV dilation with low normal RV systolic function.  4. Mitral regurgitation: Severe on 12/16 TEE, but was only trivial on echo in 5/17 with aggressive medical treatment.  5. OSA: Not using CPAP.  6. HTN 7. Nephrolithiasis 8. Atrial fibrillation: Noted in 12/16 initially.  Paroxysmal.  Had DCCV to NSR in 3/17.  9. PFTs (1/17) with minimal obstructive airways disease but severely decreased DLCO (37% predicted), possibly suggestive of  pulmonary vascular disease.  10. ABIs (5/17) were normal.  11. CKD  SH: Prior smoker quit 2013, lives in [place], retired Art gallery manager, married.   FH: No premature CAD.  ROS: All systems reviewed and negative except as per HPI.   Current Outpatient Prescriptions  Medication Sig Dispense Refill  . DIGITEK 125 MCG tablet take 1 tablet by mouth once daily 30 tablet 6  . ELIQUIS 5 MG TABS tablet Take 5 mg by mouth 2 (two) times daily.  1  . furosemide (LASIX) 20 MG tablet Take 2 tablets (40 mg total) by mouth as needed (for 3 lb weight gain over night or 5 lb weight gain in a week). 90 tablet 6  . isosorbide mononitrate (IMDUR) 30 MG 24 hr tablet Take 1 tablet (30 mg total) by mouth daily. 30 tablet 3  . metoprolol succinate (TOPROL-XL) 25 MG 24 hr tablet Take 2 tabs in AM and 3 tabs in PM 180 tablet 6  . rosuvastatin (CRESTOR) 40 MG tablet Take 1 tablet (40 mg total) by mouth daily. 30 tablet 6  . sacubitril-valsartan (ENTRESTO) 24-26 MG Take 1 tablet by mouth 2 (two) times daily. 60 tablet 3  . spironolactone (ALDACTONE) 25 MG tablet Take 0.5 tablets (12.5 mg total) by mouth daily. 15 tablet 6  . Coenzyme Q10 200 MG capsule Take 1 capsule (200 mg total) by mouth daily.    . Magnesium Oxide 200 MG TABS Take 1 tablet (200 mg total) by mouth daily.  0   No current facility-administered medications for this encounter.    BP 140/70 (BP Location: Left Arm, Patient Position: Sitting, Cuff Size: Normal)   Pulse (!) 45   Wt 213 lb (96.6 kg)   SpO2 98%   BMI 29.71 kg/m  General: NAD Neck: JVP not elevated, no thyromegaly or thyroid nodule.  Lungs: Decreased breath sounds bilaterally.  CV: Nondisplaced PMI.  Heart sounds quiet, irregular S1/S2, no S3/S4, no murmur.  No peripheral edema.  No carotid bruit.  2+ PT on right, unable to palpate PT on left.   Abdomen: Soft, nontender, no hepatosplenomegaly, no distention.  Skin: Intact without lesions or rashes.  Neurologic: Alert and oriented x  3.  Psych: Normal affect. Extremities: No clubbing or cyanosis.  HEENT: Normal.   Assessment/Plan: 1. Chronic systolic CHF: EF 16-10% on TEE from 12/16.  cMRI 2/17 with EF 19%, unable to assess for delayed enhancement on this study.  Ischemic cardiomyopathy.  Low cardiac output by RHC in 1/17 but filling pressures optimized.  Despite low output on RHC, he has been doing quite well with medical treatment.  CPX with only mild functional limitation.  Most recent echo in 5/17 showed improvement in EF to 40-45%, now outside of ICD range.  NYHA class II.  Volume looks ok on exam today.   - He is now using Lasix only prn.   - Continue current Entresto and spironolactone. - with bradycardia, will have him decrease Toprol XL  to 50 qam/75 qpm.   - Continue digoxin, check level.   - BMET today.  2. CAD: s/p CABG.  On 1/17 cath, only LIMA-LAD and SVG-OM2 still patent.  I do not think that he would derive much benefit from attempting redo CABG.   - No ASA given use of apixaban.   3. Atrial fibrillation: Paroxysmal.  Successful DCCV in 3/17 on amiodarone.  In NSR today.  He is bradycardic. - I am going to try him off amiodarone given bradycardia and improvement in EF/decrease in MR on last echo. Check LFTs/TSH today. - Continue apixaban. CBC today.  4. Mitral regurgitation: Severe by 12/16 TEE, probably functional.  Much improved on 5/17 echo, only trivial. 5. CKD: Creatinine elevated in 1.88, Lasix cut back.  Repeat BMET today.  6. Cramps: Not related to exertion, normal ABIs in 5/17.  I am going to have him try magnesium oxide and coenzyme Q10 200 mg daily.  If this regimen does not help, will have him stop Crestor x 1 week.  If stopping Crestor helps, will have him start on atorvastatin (took atorvastatin 80 mg daily in the past, says he did not cramp when on this med).  If stopping Crestor makes no difference, restart Crestor.   Follow up 3 months.   Marca AnconaDalton McLean 04/12/2016

## 2016-04-14 ENCOUNTER — Telehealth (HOSPITAL_COMMUNITY): Payer: Self-pay | Admitting: *Deleted

## 2016-04-14 NOTE — Telephone Encounter (Signed)
Notes Recorded by Modesta MessingJasmine Q Earline Stiner, CMA on 04/14/2016 at 4:22 PM EDT Patient aware. Pts wife aware. I mailed her a list of foods that are high in potassium ( foods he should avoid). Pt added to lab schedule 9/18   ------  Notes Recorded by Laurey Moralealton S McLean, MD on 04/11/2016 at 5:20 PM EDT Creatinine better. Make sure he is following low K diet and is not taking any supplemental K. Repeat BMET 10 days.    Ref Range & Units 3d ago 49mo ago 71mo ago   Sodium 135 - 145 mmol/L 137 136 140   Potassium 3.5 - 5.1 mmol/L 5.2  5.0 4.5   Chloride 101 - 111 mmol/L 106 107 111   CO2 22 - 32 mmol/L 26 21  20     Glucose, Bld 65 - 99 mg/dL 161119  [id number]  045120    BUN 6 - 20 mg/dL 22  44  35    Creatinine, Ser 0.61 - 1.24 mg/dL 4.091.36  8.111.88  9.141.36    Calcium 8.9 - 10.3 mg/dL 9.5 9.7 9.6   Total Protein 6.5 - 8.1 g/dL 6.9 6.8    Albumin 3.5 - 5.0 g/dL 4.2 4.3    AST 15 - 41 U/L 28 29    ALT 17 - 63 U/L 35 39    Alkaline Phosphatase 38 - 126 U/L 57 57    Total Bilirubin 0.3 - 1.2 mg/dL 0.8 0.9    GFR calc non Af Amer >60 mL/min 52  35  52    GFR calc Af Amer >60 mL/min 60  40CM  60CM

## 2016-04-21 ENCOUNTER — Ambulatory Visit (HOSPITAL_COMMUNITY)
Admission: RE | Admit: 2016-04-21 | Discharge: 2016-04-21 | Disposition: A | Discharge status: Home / Self Care | Payer: Medicare Other | Source: Ambulatory Visit | Attending: Internal Medicine | Admitting: Internal Medicine

## 2016-04-21 DIAGNOSIS — I5043 Acute on chronic combined systolic (congestive) and diastolic (congestive) heart failure: Secondary | ICD-10-CM | POA: Diagnosis not present

## 2016-04-21 LAB — BASIC METABOLIC PANEL
ANION GAP: 6 (ref 5–15)
BUN: 29 mg/dL — ABNORMAL HIGH (ref 6–20)
CALCIUM: 9.4 mg/dL (ref 8.9–10.3)
CHLORIDE: 105 mmol/L (ref 101–111)
CO2: 25 mmol/L (ref 22–32)
CREATININE: 1.31 mg/dL — AB (ref 0.61–1.24)
GFR calc non Af Amer: 54 mL/min — ABNORMAL LOW (ref >60)
GLUCOSE: 125 mg/dL — AB (ref 65–99)
Potassium: 4.8 mmol/L (ref 3.5–5.1)
Sodium: 136 mmol/L (ref 135–145)

## 2016-05-09 ENCOUNTER — Other Ambulatory Visit (HOSPITAL_COMMUNITY): Payer: Self-pay | Admitting: Cardiology

## 2016-05-29 ENCOUNTER — Telehealth (HOSPITAL_COMMUNITY): Payer: Self-pay | Admitting: *Deleted

## 2016-05-29 NOTE — Telephone Encounter (Signed)
Pt's wife called to let us know pt did a trial off Crestor for a couple of weeks with no improvement in leg pain, so he resumed the Crestor 40 mg day, she just wanted us to be aware.

## 2016-06-09 ENCOUNTER — Other Ambulatory Visit (HOSPITAL_COMMUNITY): Payer: Self-pay | Admitting: Cardiology

## 2016-06-20 ENCOUNTER — Other Ambulatory Visit (HOSPITAL_COMMUNITY): Payer: Self-pay | Admitting: Cardiology

## 2016-06-20 ENCOUNTER — Other Ambulatory Visit: Payer: Self-pay | Admitting: Cardiovascular Disease

## 2016-07-14 ENCOUNTER — Encounter (HOSPITAL_COMMUNITY): Payer: Self-pay

## 2016-07-14 ENCOUNTER — Ambulatory Visit (HOSPITAL_COMMUNITY)
Admission: RE | Admit: 2016-07-14 | Discharge: 2016-07-14 | Disposition: A | Discharge status: Home / Self Care | Payer: Medicare Other | Source: Ambulatory Visit | Attending: Cardiology | Admitting: Cardiology

## 2016-07-14 VITALS — BP 142/80 | HR 58 | Wt 222.0 lb

## 2016-07-14 DIAGNOSIS — Z79899 Other long term (current) drug therapy: Secondary | ICD-10-CM | POA: Insufficient documentation

## 2016-07-14 DIAGNOSIS — Z951 Presence of aortocoronary bypass graft: Secondary | ICD-10-CM

## 2016-07-14 DIAGNOSIS — I5022 Chronic systolic (congestive) heart failure: Secondary | ICD-10-CM | POA: Diagnosis not present

## 2016-07-14 DIAGNOSIS — I2510 Atherosclerotic heart disease of native coronary artery without angina pectoris: Secondary | ICD-10-CM | POA: Insufficient documentation

## 2016-07-14 DIAGNOSIS — Z87891 Personal history of nicotine dependence: Secondary | ICD-10-CM | POA: Insufficient documentation

## 2016-07-14 DIAGNOSIS — I5042 Chronic combined systolic (congestive) and diastolic (congestive) heart failure: Secondary | ICD-10-CM

## 2016-07-14 DIAGNOSIS — I130 Hypertensive heart and chronic kidney disease with heart failure and stage 1 through stage 4 chronic kidney disease, or unspecified chronic kidney disease: Secondary | ICD-10-CM | POA: Insufficient documentation

## 2016-07-14 DIAGNOSIS — I4892 Unspecified atrial flutter: Secondary | ICD-10-CM | POA: Diagnosis not present

## 2016-07-14 DIAGNOSIS — Z9889 Other specified postprocedural states: Secondary | ICD-10-CM | POA: Diagnosis not present

## 2016-07-14 DIAGNOSIS — R252 Cramp and spasm: Secondary | ICD-10-CM | POA: Insufficient documentation

## 2016-07-14 DIAGNOSIS — I480 Paroxysmal atrial fibrillation: Secondary | ICD-10-CM | POA: Diagnosis not present

## 2016-07-14 DIAGNOSIS — I255 Ischemic cardiomyopathy: Secondary | ICD-10-CM | POA: Diagnosis not present

## 2016-07-14 DIAGNOSIS — I4891 Unspecified atrial fibrillation: Secondary | ICD-10-CM

## 2016-07-14 DIAGNOSIS — I340 Nonrheumatic mitral (valve) insufficiency: Secondary | ICD-10-CM | POA: Diagnosis not present

## 2016-07-14 DIAGNOSIS — I252 Old myocardial infarction: Secondary | ICD-10-CM | POA: Diagnosis not present

## 2016-07-14 LAB — BASIC METABOLIC PANEL
Anion gap: 6 (ref 5–15)
BUN: 23 mg/dL — AB (ref 6–20)
CALCIUM: 9.4 mg/dL (ref 8.9–10.3)
CHLORIDE: 105 mmol/L (ref 101–111)
CO2: 28 mmol/L (ref 22–32)
CREATININE: 1.13 mg/dL (ref 0.61–1.24)
GFR calc Af Amer: >60 mL/min (ref >60)
GFR calc non Af Amer: >60 mL/min (ref >60)
GLUCOSE: 112 mg/dL — AB (ref 65–99)
Potassium: 4.6 mmol/L (ref 3.5–5.1)
Sodium: 139 mmol/L (ref 135–145)

## 2016-07-14 LAB — BRAIN NATRIURETIC PEPTIDE: B Natriuretic Peptide: 95.2 pg/mL (ref 0.0–100.0)

## 2016-07-14 LAB — CK: Total CK: 206 U/L (ref 49–397)

## 2016-07-14 NOTE — Patient Instructions (Signed)
STOP magnesium and Coq10 to see if this improves rash. If rash does not subside, please call CHF clinic as we may need to restart these medications.  Follow up with your PCP about rash.  Routine lab work today. Will notify you of abnormal results, otherwise no news is good news!  Follow up 4 months with Dr. Shirlee LatchMcLean.  Do the following things EVERYDAY: 1) Weigh yourself in the morning before breakfast. Write it down and keep it in a log. 2) Take your medicines as prescribed 3) Eat low salt foods-Limit salt (sodium) to 2000 mg per day.  4) Stay as active as you can everyday 5) Limit all fluids for the day to less than 2 liters

## 2016-07-14 NOTE — Progress Notes (Signed)
Patient ID: Stanley Wells, male   DOB: 03/05/1946, 70 y.o.   MRN: [id number] PCP: Dr. Jeanie Seweredding Cardiology: Dr. Shirlee LatchMcLean  70 yo with history of CAD, ischemic cardiomyopathy/chronic systolic CHF, severe functional mitral regurgitation, and persistent atrial fibrillation presents for CHF clinic evaluation.  Patient had MI then CABG in 2003.  Cardiac cath in 2015 showed only LIMA-LAD and SVG-OM patent.  In 2015, EF was 30-35%.  He had atrial flutter in 2015 and again in 11/16, both times was cardioverted.  After the 11/16 cardioversion, he stopped all his meds.  He was admitted again in 12/16 with atrial fibrillation/RVR and a CHF exacerbation.  He was diuresed and started on Eliquis with plan for DCCV after 4 weeks of anticoagulation.  He was brought back at the end of December for cardioversion, but this was unsuccessful.  TEE in 12/16 showed EF 20-25% with severe functional MR.    He was seen by Dr Cornelius Moraswen with plan for tentative plan for mitral valve repair versus replacement and possible tricuspid valve repair along with MAZE procedure.  He was referred to CHF clinic for optimization prior to surgery.   He went for left and right heart cath. RHC showed good left and right heart filling pressures but cardiac output was low.  Coronary angiography was stable with patent LIMA-LAD and SVG-OM.  Other SVGs occluded.    He had DCCV in 3/17 and is in NSR today on amiodarone.  He had a CPX in 3/17 with only mild functional impairment from CHF.    His most recent echo in 5/17 showed improvement in EF to 40-45% with only trivial MR.    Still getting occasional cramping in his buttocks and thighs.  It tends to be momentary but can be quite severe.  Not related to activity.  ABIs were normal earlier this year.  Stopping Crestor x 2 weeks had no effect on this pain.  No chest pain or exertional dyspnea.  No BRBPR or melena. No palpitations.  He is in NSR.   ECG: NSR, nonspecific T wave flattening  Labs (9/16): LDL  126, HDL 38 Labs (12/16): K 4, creatinine 1.15 Labs (1/17): K 4.4, creatinine 1.09, LDL 42, HDL 39 Labs (10/01/2015): K 4.5 Creatinine 1.27 Dig level 0.4  Labs (4/17): digoxin 0.4, LFTs normal, TSH normal Labs (5/17): K 4.5, creatinine 1.36 Labs (6/17): K 5, creatinine 1.88, BNP 88, LDL 54, HDl 39, digoxin 0.8, LFTs normal, TSH normal Labs (9/17): K 4.8, creatinine 1.3, digoxin 0.6, TSH normal, LFTs normal  PMH: 1. CAD: s/p MI in 2003.  CABG x 5 in 2003 with LIMA-LAD, SVG-D1, SVG-OM2, seq SVG-PDA/PLV.  LHC/RHC 11/15 with native arteries essentially occluded.  SVG-D1 and seq SVG-PDA/PLV occluded, LIMA and SVG-OM patent.  LHC (1/17) with patent LIMA-LAD, occluded SVG-D, patent SVG-OM => SVG touches down on inferior branch of OM1 that backfills to superior branch of OM1, there are serial 70-80% stenoses in the proximal inferior OM1 branch that may compromise backflow to superior branch OM1, SVG-PDA/PLV totally occluded, EF 25% . 2. Atrial flutter: DCCV 2015.  Recurred 11/16 with DCCV.   3. Chronic systolic CHF: Ischemic cardiomyopathy.  Echo (2015) with moderate-severe MR, EF 30-35%.  TEE (12/16) with severe central MR (functional), EF 20-25% with diffuse hypokinesis, moderately decreased RV systolic function, moderate TR.  RHC (1/17) with mean RA 5, PA 27/12 mean 18, mean PCWP 10, CI 1.7 Fick/1.83 thermo. EF 25% on 1/17 LV-gram.  Cardiac MRI (2/17) with EF 19%, dilated LV,  images not adequate to assess delayed enhancement.  - CPX (3/17) with peak VO2 20.4, VE/VCO2 slope 34, RER 1.11 => mild functional impairment.  - Echo (5/17) with EF 40-45%, trivial MR, mild RV dilation with low normal RV systolic function.  4. Mitral regurgitation: Severe on 12/16 TEE, but was only trivial on echo in 5/17 with aggressive medical treatment.  5. OSA: Not using CPAP.  6. HTN 7. Nephrolithiasis 8. Atrial fibrillation: Noted in 12/16 initially.  Paroxysmal.  Had DCCV to NSR in 3/17.  9. PFTs (1/17) with minimal  obstructive airways disease but severely decreased DLCO (37% predicted), possibly suggestive of pulmonary vascular disease.  10. ABIs (5/17) were normal.  11. CKD  SH: Prior smoker quit 2013, [place] in [place], retired Art gallery managerengineer, married.   FH: No premature CAD.  ROS: All systems reviewed and negative except as per HPI.   Current Outpatient Prescriptions  Medication Sig Dispense Refill  . DIGITEK 125 MCG tablet take 1 tablet by mouth once daily 30 tablet 6  . ELIQUIS 5 MG TABS tablet Take 5 mg by mouth 2 (two) times daily.  1  . ENTRESTO 24-26 MG take 1 tablet by mouth twice a day 60 tablet 3  . isosorbide mononitrate (IMDUR) 30 MG 24 hr tablet take 1 tablet by mouth once daily 30 tablet 3  . metoprolol succinate (TOPROL-XL) 25 MG 24 hr tablet Take 2 tabs in AM and 3 tabs in PM 180 tablet 6  . rosuvastatin (CRESTOR) 40 MG tablet take 1 tablet by mouth once daily 30 tablet 6  . spironolactone (ALDACTONE) 25 MG tablet Take 0.5 tablets (12.5 mg total) by mouth daily. 15 tablet 6  . furosemide (LASIX) 20 MG tablet Take 2 tablets (40 mg total) by mouth as needed (for 3 lb weight gain over night or 5 lb weight gain in a week). (Patient not taking: Reported on 07/14/2016) 90 tablet 6   No current facility-administered medications for this encounter.    BP (!) 142/80   Pulse (!) 58   Wt 222 lb (100.7 kg)   SpO2 99%   BMI 30.96 kg/m  General: NAD Neck: JVP not elevated, no thyromegaly or thyroid nodule.  Lungs: Decreased breath sounds bilaterally.  CV: Nondisplaced PMI.  Heart sounds quiet, irregular S1/S2, no S3/S4, no murmur.  No peripheral edema.  No carotid bruit.  Normal peripheral pulses.    Abdomen: Soft, nontender, no hepatosplenomegaly, no distention.  Skin: Intact without lesions or rashes.  Neurologic: Alert and oriented x 3.  Psych: Normal affect. Extremities: No clubbing or cyanosis.  HEENT: Normal.   Assessment/Plan: 1. Chronic systolic CHF: EF 95-62%20-25% on TEE from  12/16.  cMRI 2/17 with EF 19%, unable to assess for delayed enhancement on this study.  Ischemic cardiomyopathy.  Low cardiac output by RHC in 1/17 but filling pressures optimized.  Despite low output on RHC, he has been doing quite well with medical treatment.  CPX with only mild functional limitation.  Most recent echo in 5/17 showed improvement in EF to 40-45%, now outside of ICD range.  NYHA class II.  Volume looks ok on exam today.   - He is now using Lasix only prn.   - Continue current Entresto and spironolactone.  I am not going to increase meds today, he is concerned that his leg cramping is coming from one of his meds and does not want to add anything.  - Continue Toprol XL 50 qam/75 qpm.   - Continue digoxin,  check level.   - BMET/BNP today.  - Repeat echo in 5/18.  2. CAD: s/p CABG.  On 1/17 cath, only LIMA-LAD and SVG-OM2 still patent.  I do not think that he would derive much benefit from attempting redo CABG.   - No ASA given use of apixaban.   3. Atrial fibrillation: Paroxysmal.  Successful DCCV in 3/17 on amiodarone.  In NSR today.   - He is now off amiodarone.  - Continue apixaban.   4. Mitral regurgitation: Severe by 12/16 TEE, probably functional.  Much improved on 5/17 echo, only trivial. 5. CKD: Stage III. BMET today.   6. Cramps:  In buttocks and posterior thighs. Not related to exertion, normal ABIs in 5/17. Stopping Crestor x 2 weeks did not help.  - Check CPK.  - ?Lumbar spine disease with sciatica.  I asked him to followup on this with his PCP.   Follow up 4 months.   Marca Ancona 07/14/2016

## 2016-07-19 ENCOUNTER — Emergency Department (HOSPITAL_COMMUNITY): Payer: Medicare Other

## 2016-07-19 ENCOUNTER — Inpatient Hospital Stay (HOSPITAL_COMMUNITY)
Admission: EM | Admit: 2016-07-19 | Discharge: 2016-07-25 | DRG: 339 | Disposition: A | Discharge status: Home / Self Care | Payer: Medicare Other | Attending: Internal Medicine | Admitting: Internal Medicine

## 2016-07-19 ENCOUNTER — Encounter (HOSPITAL_COMMUNITY): Payer: Self-pay | Admitting: Emergency Medicine

## 2016-07-19 DIAGNOSIS — I4892 Unspecified atrial flutter: Secondary | ICD-10-CM | POA: Diagnosis not present

## 2016-07-19 DIAGNOSIS — Z6829 Body mass index (BMI) 29.0-29.9, adult: Secondary | ICD-10-CM

## 2016-07-19 DIAGNOSIS — I110 Hypertensive heart disease with heart failure: Secondary | ICD-10-CM | POA: Diagnosis not present

## 2016-07-19 DIAGNOSIS — I517 Cardiomegaly: Secondary | ICD-10-CM | POA: Diagnosis not present

## 2016-07-19 DIAGNOSIS — Z87891 Personal history of nicotine dependence: Secondary | ICD-10-CM

## 2016-07-19 DIAGNOSIS — E669 Obesity, unspecified: Secondary | ICD-10-CM | POA: Diagnosis present

## 2016-07-19 DIAGNOSIS — K567 Ileus, unspecified: Secondary | ICD-10-CM | POA: Diagnosis not present

## 2016-07-19 DIAGNOSIS — I5022 Chronic systolic (congestive) heart failure: Secondary | ICD-10-CM | POA: Diagnosis not present

## 2016-07-19 DIAGNOSIS — Z4682 Encounter for fitting and adjustment of non-vascular catheter: Secondary | ICD-10-CM | POA: Diagnosis not present

## 2016-07-19 DIAGNOSIS — N179 Acute kidney failure, unspecified: Secondary | ICD-10-CM | POA: Diagnosis not present

## 2016-07-19 DIAGNOSIS — I481 Persistent atrial fibrillation: Secondary | ICD-10-CM | POA: Diagnosis not present

## 2016-07-19 DIAGNOSIS — I340 Nonrheumatic mitral (valve) insufficiency: Secondary | ICD-10-CM | POA: Diagnosis present

## 2016-07-19 DIAGNOSIS — K353 Acute appendicitis with localized peritonitis, without perforation or gangrene: Secondary | ICD-10-CM

## 2016-07-19 DIAGNOSIS — I2510 Atherosclerotic heart disease of native coronary artery without angina pectoris: Secondary | ICD-10-CM | POA: Diagnosis not present

## 2016-07-19 DIAGNOSIS — I10 Essential (primary) hypertension: Secondary | ICD-10-CM | POA: Diagnosis not present

## 2016-07-19 DIAGNOSIS — K3580 Unspecified acute appendicitis: Secondary | ICD-10-CM | POA: Diagnosis not present

## 2016-07-19 DIAGNOSIS — Z79899 Other long term (current) drug therapy: Secondary | ICD-10-CM | POA: Diagnosis not present

## 2016-07-19 DIAGNOSIS — K9189 Other postprocedural complications and disorders of digestive system: Secondary | ICD-10-CM | POA: Diagnosis present

## 2016-07-19 DIAGNOSIS — I255 Ischemic cardiomyopathy: Secondary | ICD-10-CM | POA: Diagnosis not present

## 2016-07-19 DIAGNOSIS — Z7901 Long term (current) use of anticoagulants: Secondary | ICD-10-CM

## 2016-07-19 DIAGNOSIS — I130 Hypertensive heart and chronic kidney disease with heart failure and stage 1 through stage 4 chronic kidney disease, or unspecified chronic kidney disease: Secondary | ICD-10-CM | POA: Diagnosis present

## 2016-07-19 DIAGNOSIS — D72829 Elevated white blood cell count, unspecified: Secondary | ICD-10-CM | POA: Diagnosis not present

## 2016-07-19 DIAGNOSIS — Z978 Presence of other specified devices: Secondary | ICD-10-CM | POA: Diagnosis not present

## 2016-07-19 DIAGNOSIS — G4733 Obstructive sleep apnea (adult) (pediatric): Secondary | ICD-10-CM | POA: Diagnosis present

## 2016-07-19 DIAGNOSIS — I252 Old myocardial infarction: Secondary | ICD-10-CM

## 2016-07-19 DIAGNOSIS — Z87442 Personal history of urinary calculi: Secondary | ICD-10-CM

## 2016-07-19 DIAGNOSIS — I5042 Chronic combined systolic (congestive) and diastolic (congestive) heart failure: Secondary | ICD-10-CM | POA: Diagnosis present

## 2016-07-19 DIAGNOSIS — Z951 Presence of aortocoronary bypass graft: Secondary | ICD-10-CM | POA: Diagnosis not present

## 2016-07-19 DIAGNOSIS — E785 Hyperlipidemia, unspecified: Secondary | ICD-10-CM | POA: Diagnosis not present

## 2016-07-19 DIAGNOSIS — I480 Paroxysmal atrial fibrillation: Secondary | ICD-10-CM | POA: Diagnosis not present

## 2016-07-19 DIAGNOSIS — Z9089 Acquired absence of other organs: Secondary | ICD-10-CM | POA: Diagnosis not present

## 2016-07-19 DIAGNOSIS — R110 Nausea: Secondary | ICD-10-CM | POA: Diagnosis not present

## 2016-07-19 DIAGNOSIS — I5043 Acute on chronic combined systolic (congestive) and diastolic (congestive) heart failure: Secondary | ICD-10-CM | POA: Diagnosis not present

## 2016-07-19 DIAGNOSIS — K352 Acute appendicitis with generalized peritonitis: Secondary | ICD-10-CM | POA: Diagnosis not present

## 2016-07-19 DIAGNOSIS — M6281 Muscle weakness (generalized): Secondary | ICD-10-CM

## 2016-07-19 LAB — URINALYSIS, ROUTINE W REFLEX MICROSCOPIC
BILIRUBIN URINE: NEGATIVE
Bacteria, UA: NONE SEEN
Glucose, UA: NEGATIVE mg/dL
Ketones, ur: NEGATIVE mg/dL
LEUKOCYTES UA: NEGATIVE
Nitrite: NEGATIVE
PH: 5 (ref 5.0–8.0)
Protein, ur: NEGATIVE mg/dL
SPECIFIC GRAVITY, URINE: 1.018 (ref 1.005–1.030)
SQUAMOUS EPITHELIAL / LPF: NONE SEEN

## 2016-07-19 LAB — BRAIN NATRIURETIC PEPTIDE: B NATRIURETIC PEPTIDE 5: 78.2 pg/mL (ref 0.0–100.0)

## 2016-07-19 LAB — I-STAT CG4 LACTIC ACID, ED
LACTIC ACID, VENOUS: 2.03 mmol/L — AB (ref 0.5–1.9)
Lactic Acid, Venous: 1.35 mmol/L (ref 0.5–1.9)

## 2016-07-19 LAB — CBC WITH DIFFERENTIAL/PLATELET
BASOS PCT: 0 %
Basophils Absolute: 0 10*3/uL (ref 0.0–0.1)
EOS ABS: 0.1 10*3/uL (ref 0.0–0.7)
EOS PCT: 0 %
HCT: 44.6 % (ref 39.0–52.0)
HEMOGLOBIN: 14.7 g/dL (ref 13.0–17.0)
Lymphocytes Relative: 13 %
Lymphs Abs: 2.5 10*3/uL (ref 0.7–4.0)
MCH: 31.1 pg (ref 26.0–34.0)
MCHC: 33 g/dL (ref 30.0–36.0)
MCV: 94.3 fL (ref 78.0–100.0)
MONO ABS: 1 10*3/uL (ref 0.1–1.0)
MONOS PCT: 5 %
NEUTROS PCT: 82 %
Neutro Abs: 16.3 10*3/uL — ABNORMAL HIGH (ref 1.7–7.7)
PLATELETS: 199 10*3/uL (ref 150–400)
RBC: 4.73 MIL/uL (ref 4.22–5.81)
RDW: 14.4 % (ref 11.5–15.5)
WBC: 19.9 10*3/uL — ABNORMAL HIGH (ref 4.0–10.5)

## 2016-07-19 LAB — COMPREHENSIVE METABOLIC PANEL
ALBUMIN: 4.1 g/dL (ref 3.5–5.0)
ALT: 36 U/L (ref 17–63)
ANION GAP: 12 (ref 5–15)
AST: 32 U/L (ref 15–41)
Alkaline Phosphatase: 66 U/L (ref 38–126)
BUN: 20 mg/dL (ref 6–20)
CHLORIDE: 104 mmol/L (ref 101–111)
CO2: 21 mmol/L — AB (ref 22–32)
Calcium: 9.6 mg/dL (ref 8.9–10.3)
Creatinine, Ser: 1.13 mg/dL (ref 0.61–1.24)
GFR calc non Af Amer: >60 mL/min (ref >60)
GLUCOSE: 157 mg/dL — AB (ref 65–99)
POTASSIUM: 4.2 mmol/L (ref 3.5–5.1)
SODIUM: 137 mmol/L (ref 135–145)
Total Bilirubin: 1.1 mg/dL (ref 0.3–1.2)
Total Protein: 7.1 g/dL (ref 6.5–8.1)

## 2016-07-19 LAB — SURGICAL PCR SCREEN
MRSA, PCR: NEGATIVE
STAPHYLOCOCCUS AUREUS: POSITIVE — AB

## 2016-07-19 LAB — LIPASE, BLOOD: Lipase: 16 U/L (ref 11–51)

## 2016-07-19 LAB — DIGOXIN LEVEL: DIGOXIN LVL: 0.3 ng/mL — AB (ref 0.8–2.0)

## 2016-07-19 MED ORDER — ROSUVASTATIN CALCIUM 40 MG PO TABS
40.0000 mg | ORAL_TABLET | Freq: Every day | ORAL | Status: DC
  Administered 2016-07-19 – 2016-07-24 (×6): 40 mg via ORAL
  Filled 2016-07-19 (×8): qty 1

## 2016-07-19 MED ORDER — IOPAMIDOL (ISOVUE-300) INJECTION 61%
INTRAVENOUS | Status: AC
  Administered 2016-07-19: 100 mL
  Filled 2016-07-19: qty 100

## 2016-07-19 MED ORDER — ACETAMINOPHEN 650 MG RE SUPP: 650.0000 mg | Freq: Four times a day (QID) | RECTAL | Status: DC | PRN

## 2016-07-19 MED ORDER — ONDANSETRON HCL 4 MG/2ML IJ SOLN
4.0000 mg | Freq: Four times a day (QID) | INTRAMUSCULAR | Status: DC | PRN
  Administered 2016-07-21 (×2): 4 mg via INTRAVENOUS
  Filled 2016-07-19 (×2): qty 2

## 2016-07-19 MED ORDER — METOPROLOL SUCCINATE ER 50 MG PO TB24
50.0000 mg | ORAL_TABLET | ORAL | Status: DC
  Administered 2016-07-20 – 2016-07-25 (×6): 50 mg via ORAL
  Filled 2016-07-19 (×6): qty 1

## 2016-07-19 MED ORDER — METOPROLOL SUCCINATE ER 25 MG PO TB24
75.0000 mg | ORAL_TABLET | Freq: Every evening | ORAL | Status: DC
  Administered 2016-07-19 – 2016-07-24 (×6): 75 mg via ORAL
  Filled 2016-07-19 (×6): qty 1

## 2016-07-19 MED ORDER — ACETAMINOPHEN 325 MG PO TABS: 650.0000 mg | ORAL_TABLET | Freq: Four times a day (QID) | ORAL | Status: DC | PRN

## 2016-07-19 MED ORDER — CHLORHEXIDINE GLUCONATE CLOTH 2 % EX PADS
6.0000 | MEDICATED_PAD | Freq: Every day | CUTANEOUS | Status: AC
  Administered 2016-07-19 – 2016-07-23 (×5): 6 via TOPICAL

## 2016-07-19 MED ORDER — METOPROLOL SUCCINATE ER 25 MG PO TB24: 25.0000 mg | ORAL_TABLET | Freq: Every day | ORAL | Status: DC

## 2016-07-19 MED ORDER — MUPIROCIN 2 % EX OINT
1.0000 "application " | TOPICAL_OINTMENT | Freq: Two times a day (BID) | CUTANEOUS | Status: AC
  Administered 2016-07-19 – 2016-07-24 (×10): 1 via NASAL
  Filled 2016-07-19 (×2): qty 22

## 2016-07-19 MED ORDER — HYDROMORPHONE HCL 2 MG/ML IJ SOLN
0.5000 mg | INTRAMUSCULAR | Status: DC | PRN
  Administered 2016-07-20: 0.5 mg via INTRAVENOUS
  Filled 2016-07-19: qty 1

## 2016-07-19 MED ORDER — DIGOXIN 125 MCG PO TABS
125.0000 ug | ORAL_TABLET | Freq: Every day | ORAL | Status: DC
  Administered 2016-07-19 – 2016-07-25 (×7): 125 ug via ORAL
  Filled 2016-07-19 (×7): qty 1

## 2016-07-19 MED ORDER — ONDANSETRON HCL 4 MG PO TABS: 4.0000 mg | ORAL_TABLET | Freq: Four times a day (QID) | ORAL | Status: DC | PRN

## 2016-07-19 MED ORDER — SODIUM CHLORIDE 0.9% FLUSH
3.0000 mL | Freq: Two times a day (BID) | INTRAVENOUS | Status: DC
  Administered 2016-07-19 – 2016-07-24 (×6): 3 mL via INTRAVENOUS

## 2016-07-19 MED ORDER — SODIUM CHLORIDE 0.9 % IV SOLN: 3.0000 g | Freq: Four times a day (QID) | INTRAVENOUS | Status: DC

## 2016-07-19 MED ORDER — PIPERACILLIN-TAZOBACTAM 3.375 G IVPB
3.3750 g | Freq: Three times a day (TID) | INTRAVENOUS | Status: DC
  Administered 2016-07-19 – 2016-07-25 (×18): 3.375 g via INTRAVENOUS
  Filled 2016-07-19 (×20): qty 50

## 2016-07-19 MED ORDER — SODIUM CHLORIDE 0.9 % IV SOLN
Freq: Once | INTRAVENOUS | Status: AC
  Administered 2016-07-19: 11:00:00 via INTRAVENOUS

## 2016-07-19 MED ORDER — DEXTROSE 5 % IV SOLN: 2.0000 g | Freq: Once | INTRAVENOUS | Status: DC

## 2016-07-19 NOTE — H&P (Signed)
Date: 07/19/2016               Patient Name:  Stanley [name]: [id number]  DOB: 1945/11/02 Age / Sex: 70 y.o., male   PCP: [name], MD         Medical Service: Internal Medicine Teaching Service         Attending Physician: Dr. Doneen Poisson, MD    First Contact: Dr. Reymundo Poll Pager: [phone]  Second Contact: Dr. Valentino Nose  Pager: [phone]       After Hours (After 5p/  First Contact Pager: [phone]  weekends / holidays): Second Contact Pager: [phone]   Chief Complaint: Abdominal pain   History of Present Illness: Patient is a 70 yo M with pmhx of HTN, HLD, ischemic cardiomyopathy / chronic combined systolic and diastolic heart failure, atrial fibrillation, and CABG x 5 in 2003 who present with abdominal pain since yesterday evening. Pain started around 11 pm last night. He describes the pain as a dull pain that started as intermittent but is now constant, 10/10 at it's worst. He does endorse associated nausea but denies emesis. He denies fevers at home. Prior to this abdominal pain patient was in his usual state of health. He has a history of heart failure but his symptoms are well controlled on his medication. At his baseline patient is able to do strenuous activity like mowing the lawn with a push mower without becoming short of breath. He is able to lay flat without difficulty. He follow with Dr. Shirlee Latch (cardiology) and was last seen in June of 2017 and felt to be much improved on medical therapy. Last echocardiogram in May 2015 showed mild to moderate systolic dysfunction LV EF of 57-84%, no wall motion abnormalities, and grade 1 diastolic dysfunction. This is improved from his prior echo in 2016 at which point EF was 20 - 25% with diffuse hypokinesis. He underwent exercise stress test in March 2017 showed only mild functional impairment. He does have a history of persistent a-fib rate controlled with metoprolol and digoxin. He is anticoagulated with Eliquis.    In the ED he was afebrile T 97.9, BP 187/91, HR 66, RR 16, and oxygen 100% on RA. Labs were significant for a leukocytosis of 19.9 and elevated creatinine 1.13 at his baseline. Venous lactic acid was elevated at 2.03. Lipase and BNP were normal. UA showed small hematuria but negative for nitrites or leukocytes. CT of his abdomen and pelvis revealed acute changes consistent with appendicitis.   Meds:  Current Meds  Medication Sig  . DIGITEK 125 MCG tablet take 1 tablet by mouth once daily  . ELIQUIS 5 MG TABS tablet Take 5 mg by mouth 2 (two) times daily.  Marland Kitchen ENTRESTO 24-26 MG take 1 tablet by mouth twice a day  . isosorbide mononitrate (IMDUR) 30 MG 24 hr tablet take 1 tablet by mouth once daily  . metoprolol succinate (TOPROL-XL) 25 MG 24 hr tablet Take 2 tabs in AM and 3 tabs in PM  . rosuvastatin (CRESTOR) 40 MG tablet take 1 tablet by mouth once daily  . spironolactone (ALDACTONE) 25 MG tablet Take 0.5 tablets (12.5 mg total) by mouth daily.     Allergies: Allergies as of 07/19/2016  . (No Known Allergies)   Past Medical History:  Diagnosis Date  . CAD (coronary artery disease)    a. s/p CABG 2003  b. s/p acute MI prior to CABG w/ EF <30%  . Cardiomyopathy,  ischemic: EF 20-25%   . Cholecystitis   . Chronic combined systolic and diastolic CHF (congestive heart failure) (HCC)   . Chronic systolic CHF (congestive heart failure) (HCC)    a. 2D ECHO: 05/17/2014: EF 30-35%;  b. 07/2015 TEE: EF 20-25%, diff HK, sev MR, sev dil LA w/o thrombus, mod reduced RV fxn, mod dil RA, mod TR.  Marland Kitchen. Hyperlipidemia   . Hypertensive heart disease   . Kidney stones X 1   "passed it" (08/02/2015)  . OSA (obstructive sleep apnea) 10/11/2014   refuses to use CPAP  . Persistent atrial fibrillation (HCC)    a. s/p succesful TEE/DCCV on 06/01/14.  b. on Eliquis;  c. 07/2015 recurrent AF-->Failed DCCV.  . S/P CABG x 5 02/16/2002   LIMA to LAD, SVG to D1, SVG to OM2, Sequential SVG to PDA-RPL, saphenous  vein harvest from right thigh and lower leg  . Severe mitral regurgitation   . Tricuspid regurgitation     Family History: Stomach cancer in Dad, Kidney cancer in Mom   Social History: Lives in [place] with his wife. He denies alcohol, tobacco, and illicit drug use.   Review of Systems: A complete ROS was negative except as per HPI.   Physical Exam: Blood pressure 136/85, pulse 62, temperature 98.6 F (37 C), temperature source Oral, resp. rate 18, height 5\' 11"  (1.803 m), weight 222 lb (100.7 kg), SpO2 98 %. Constitutional: NAD, appears comfortable HEENT: Atraumatic, normocephalic. PERRL, anicteric sclera.  Neck: Supple, trachea midline.  Cardiovascular: Regular rate, irregular rhythm, no murmurs, rubs, or gallops.  Pulmonary/Chest: CTAB, no wheezes, rales, or rhonchi.  Abdominal: Distended but soft. RLQ tender to palpation with some guarding, no rigidity or rebound tenderness. Normal BS.  Extremities: Warm and well perfused. Distal pulses intact. No edema.  Neurological: A&Ox3, CN II - XII grossly intact.  Skin: No rashes or erythema  Psychiatric: Normal mood and affect  EKG: Personally reviewed. NSR, no ischemic changes, unchanged from prior tracing.   CXR: Personally reviewed. Cardiomegaly but no evidence of infiltrate or consolidation.   CT Abdomen Pelvis W Contrast: IMPRESSION: Changes of acute appendicitis. Appendicolith noted within the proximal appendix.  Aortoiliac atherosclerosis.  Assessment & Plan by Problem:  Acute Appendicitis: Patient presented with 1 day of RLQ abdominal pain and associated nausea, found to have acute appendicitis on CT abdomen pelvis. He is afebrile but with leukocytosis of 19.9. S/p IV Zosyn x 1 in the ED. Abdominal exam at this point is reassuring. General surgery consulted, waiting on final recommendations. Appreciate assistance. From a pre-operative risk standpoint, based on his RCRI score is he considered moderate to high risk  (intraperitoneal surgery, ischemic cardiomyopathy, history of congestive heart failure) with a 6.6 - 11% risk of major cardiac event. However patient follows closely with cardiology and is medically optimized with NYHA class I - II symptoms. I feel that is true risk is much lower and outweighed by the benefits of the surgery.  General surgery is requesting cardiology clearance; will consult cards.  -- Surgery following, appreciate recommendations  -- Cardiology consult; appreciate recommendations  -- Dilaudid 0.5 mg q4 hours prn for pain  -- Zofran 4 mg q6 hours prn for nausea  -- Continue IV Zosyn q8 hours -- NPO   Chronic Combined Systolic and Diastolic Heart Failure: Secondary to ischemic cardiomyopathy. Follows with Dr. Shirlee LatchMcLean. Currently well compensated and medically optimized.  -- Holding imdur, spironolactone, and entresto  -- Continue metoprolol and digoxin   Atrial Fibrillation/Atrial Flutter: Persistent  a-fib with hx of atrial flutter in 2015 requiring DC cardioversion. Rate controlled on metoprolol, amiodarone, and digoxin. He is also on Eliquis.  -- Hold Eliquis for possible surgery  -- Continue digoxin 125 mcg daily  -- Continue metoprolol 50 mg qAM and 75 mg QHS  HLD: -- Continue crestor 40 mg daily   FEN: No fluids (CHF), replete lytes prn, NPO for possible surgery  VTE ppx: SCDs, holding Eliquis  Code Status: FULL  Dispo: Admit patient to Inpatient with expected length of stay greater than 2 midnights.  Signed: Reymundo Pollarolyn Zaryiah Barz, MD 07/19/2016, 12:04 PM  Pager: [phone]

## 2016-07-19 NOTE — ED Triage Notes (Signed)
Pt presents to the ED with c/o lower abdominal pain that started at approx 11pm last night. Pt reports nausea but no vomiting.

## 2016-07-19 NOTE — ED Provider Notes (Addendum)
9:06 AM Call placed to general surgery  CT consistent with acute appendicitis. Pt with RLQ tenderness on exam. NPO. Does not want any medication for pain at this time.   preop ecg and cxr will be obtained    ECG interpretation  Date: 07/19/2016  Rate: 66  Rhythm: normal sinus rhythm  QRS Axis: normal  Intervals: normal  ST/T Wave abnormalities: Nonspecific lateral T-wave changes  Conduction Disutrbances: none  Narrative Interpretation:   Old EKG Reviewed: No significant changes noted       Azalia BilisKevin Danea Manter, MD 07/19/16 [id number]    Azalia BilisKevin Nyna Chilton, MD 07/19/16 [phone]

## 2016-07-19 NOTE — ED Provider Notes (Signed)
[facility]-EMERGENCY DEPT Provider Note   CSN: [id number] Arrival date & time: 07/19/16  0448     History   Chief Complaint Chief Complaint  Patient presents with  . Abdominal Pain  . Nausea    HPI Stanley Wells is a 70 y.o. male.  Patient presents with lower abdominal pain with nausea onset 11 PM last night, progressively worsening. Denies vomiting. Denies constipation or diarrhea. Last normal bowel movement at 6 PM. Denies dysuria hematuria. States he's had similar pain in the past due to kidney stones. No previous abdominal surgeries pain is constant, nothing makes it better. Patient with history of 5 vessel bypass remotely he is on anticoagulation. Denies any chest pain or shortness of breath.   The history is provided by the patient.  Abdominal Pain   Associated symptoms include nausea. Pertinent negatives include fever, vomiting, constipation, dysuria, hematuria, headaches, arthralgias and myalgias.    Past Medical History:  Diagnosis Date  . CAD (coronary artery disease)    a. s/p CABG 2003  b. s/p acute MI prior to CABG w/ EF <30%  . Cardiomyopathy, ischemic: EF 20-25%   . Cholecystitis   . Chronic combined systolic and diastolic CHF (congestive heart failure) (HCC)   . Chronic systolic CHF (congestive heart failure) (HCC)    a. 2D ECHO: 05/17/2014: EF 30-35%;  b. 07/2015 TEE: EF 20-25%, diff HK, sev MR, sev dil LA w/o thrombus, mod reduced RV fxn, mod dil RA, mod TR.  Marland Kitchen Hyperlipidemia   . Hypertensive heart disease   . Kidney stones X 1   "passed it" (08/02/2015)  . OSA (obstructive sleep apnea) 10/11/2014   refuses to use CPAP  . Persistent atrial fibrillation (HCC)    a. s/p succesful TEE/DCCV on 06/01/14.  b. on Eliquis;  c. 07/2015 recurrent AF-->Failed DCCV.  . S/P CABG x 5 02/16/2002   LIMA to LAD, SVG to D1, SVG to OM2, Sequential SVG to PDA-RPL, saphenous vein harvest from right thigh and lower leg  . Severe mitral regurgitation   . Tricuspid regurgitation      Patient Active Problem List   Diagnosis Date Noted  . Tricuspid regurgitation   . Severe mitral regurgitation 08/04/2015  . Hyperlipidemia   . Hypertensive heart disease   . Atrial fibrillation (HCC) 08/02/2015  . A-fib (HCC) 08/02/2015  . Persistent atrial fibrillation (HCC) 08/02/2015  . Chronic anticoagulation - Eliquis, CHADS2VASC=4 07/23/2015  . Acute on chronic combined systolic and diastolic heart failure, NYHA class 2 (HCC) 07/13/2015  . Acute on chronic combined systolic and diastolic congestive heart failure, NYHA class 1 (HCC) 07/13/2015  . Mitral regurgitation 07/07/2015  . Elevated troponin 07/07/2015  . Acute kidney injury (HCC) 07/07/2015  . Hypotension 07/07/2015  . Leukocytosis 07/07/2015  . Noncompliance with medication regimen 07/05/2015  . Long term current use of anticoagulant therapy - Eliquis 06/11/2015  . Essential hypertension 05/01/2015  . OSA (obstructive sleep apnea) 10/11/2014  . Cardiomyopathy, ischemic: EF 20-25% 07/06/2014  . Chronic combined systolic and diastolic CHF (congestive heart failure) (HCC)   . Coronary artery disease involving native coronary artery without angina pectoris   . Cholecystitis   . S/P CABG x 5 02/16/2002    Past Surgical History:  Procedure Laterality Date  . CARDIAC CATHETERIZATION N/A 06/19/2014   Procedure: RIGHT/LEFT HEART CATH AND CORONARY/GRAFT ANGIOGRAPHY;  Surgeon: Lennette Bihari, MD;  Location[facility] CATH LAB;  Service: Cardiovascular;  Laterality: N/A;  . CARDIAC CATHETERIZATION  2003  . CARDIAC CATHETERIZATION N/A  08/28/2015   Procedure: Right Heart Cath and Coronary/Graft Angiography;  Surgeon: Laurey Moralealton S McLean, MD;  [facility];  Service: Cardiovascular;  Laterality: N/A;  . CARDIOVERSION N/A 06/01/2014   Procedure: CARDIOVERSION;  Surgeon: Lars MassonKatarina H Nelson, MD;  [facility];  Service: Cardiovascular;  Laterality: N/A;  . CARDIOVERSION  06/11/2015; 08/02/2015  . CARDIOVERSION N/A  08/02/2015   Procedure: CARDIOVERSION;  Surgeon: Wendall StadePeter C Nishan, MD;  [facility];  Service: Cardiovascular;  Laterality: N/A;  . CARDIOVERSION N/A 10/22/2015   Procedure: CARDIOVERSION;  Surgeon: Laurey Moralealton S McLean, MD;  [facility];  Service: Cardiovascular;  Laterality: N/A;  . CORONARY ARTERY BYPASS GRAFT  02/16/2002   CABG X5  . TEE WITHOUT CARDIOVERSION N/A 06/01/2014   Procedure: TRANSESOPHAGEAL ECHOCARDIOGRAM (TEE);  Surgeon: Lars MassonKatarina H Nelson, MD;  [facility];  Service: Cardiovascular;  Laterality: N/A;  . TEE WITHOUT CARDIOVERSION N/A 08/03/2015   Procedure: TRANSESOPHAGEAL ECHOCARDIOGRAM (TEE);  Surgeon: Lars MassonKatarina H Nelson, MD;  [facility];  Service: Cardiovascular;  Laterality: N/A;       Home Medications    Prior to Admission medications   Medication Sig Start Date End Date Taking? Authorizing Provider  DIGITEK 125 MCG tablet take 1 tablet by mouth once daily 03/24/16  Yes Laurey Moralealton S McLean, MD  ELIQUIS 5 MG TABS tablet Take 5 mg by mouth 2 (two) times daily. 06/01/15  Yes Historical Provider, MD  ENTRESTO 24-26 MG take 1 tablet by mouth twice a day 05/09/16  Yes Laurey Moralealton S McLean, MD  isosorbide mononitrate (IMDUR) 30 MG 24 hr tablet take 1 tablet by mouth once daily 06/20/16  Yes Laurey Moralealton S McLean, MD  metoprolol succinate (TOPROL-XL) 25 MG 24 hr tablet Take 2 tabs in AM and 3 tabs in PM 04/11/16  Yes Laurey Moralealton S McLean, MD  rosuvastatin (CRESTOR) 40 MG tablet take 1 tablet by mouth once daily 06/10/16  Yes Laurey Moralealton S McLean, MD  spironolactone (ALDACTONE) 25 MG tablet Take 0.5 tablets (12.5 mg total) by mouth daily. 01/10/16  Yes Amy D Clegg, NP  furosemide (LASIX) 20 MG tablet Take 2 tablets (40 mg total) by mouth as needed (for 3 lb weight gain over night or 5 lb weight gain in a week). Patient not taking: Reported on 07/19/2016 01/10/16   Amy D Filbert Schilderlegg, NP    Family History Family History  Problem Relation Age of Onset  . Cancer Mother   .  Cancer Father   . Hypertension Neg Hx   . Stroke Neg Hx     Social History Social History  Substance Use Topics  . Smoking status: Former Smoker    Packs/day: 1.00    Years: 40.00    Types: Cigarettes    Quit date: 02/02/2002  . Smokeless tobacco: Never Used  . Alcohol use Yes     Comment: 08/02/2015 "I rarely have a beer"     Allergies   Patient has no known allergies.   Review of Systems Review of Systems  Constitutional: Negative for activity change, appetite change, fatigue and fever.  HENT: Negative for congestion and rhinorrhea.   Respiratory: Negative for cough, chest tightness and shortness of breath.   Cardiovascular: Negative for chest pain.  Gastrointestinal: Positive for abdominal pain and nausea. Negative for constipation and vomiting.  Genitourinary: Negative for dysuria, hematuria and testicular pain.  Musculoskeletal: Negative for arthralgias and myalgias.  Skin: Negative for wound.  Neurological: Negative for dizziness, weakness, light-headedness, numbness and headaches.  A complete  10 system review of systems was obtained and all systems are negative except as noted in the HPI and PMH.     Physical Exam Updated Vital Signs BP 187/91 (BP Location: Right Arm)   Pulse 68   Temp 98.4 F (36.9 C) (Oral)   Resp 18   Ht 5\' 11"  (1.803 m)   Wt 222 lb (100.7 kg)   SpO2 98%   BMI 30.96 kg/m   Physical Exam  Constitutional: He is oriented to person, place, and time. He appears well-developed and well-nourished. No distress.  HENT:  Head: Normocephalic and atraumatic.  Mouth/Throat: Oropharynx is clear and moist. No oropharyngeal exudate.  Eyes: Conjunctivae and EOM are normal. Pupils are equal, round, and reactive to light.  Neck: Normal range of motion. Neck supple.  No meningismus.  Cardiovascular: Normal rate, regular rhythm, normal heart sounds and intact distal pulses.   No murmur heard. Pulmonary/Chest: Effort normal and breath sounds normal. No  respiratory distress.  Abdominal: Soft. There is tenderness. There is no rebound and no guarding.  TTP RLQ and suprapubic tenderness.  Voluntary guarding.  Genitourinary:  Genitourinary Comments: No testicular tenderness  Musculoskeletal: Normal range of motion. He exhibits no edema or tenderness.  No CVAT  Neurological: He is alert and oriented to person, place, and time. No cranial nerve deficit. He exhibits normal muscle tone. Coordination normal.  No ataxia on finger to nose bilaterally. No pronator drift. 5/5 strength throughout. CN 2-12 intact.Equal grip strength. Sensation intact.   Skin: Skin is warm. Capillary refill takes less than 2 seconds.  Psychiatric: He has a normal mood and affect. His behavior is normal.  Nursing note and vitals reviewed.    ED Treatments / Results  Labs (all labs ordered are listed, but only abnormal results are displayed) Labs Reviewed  CBC WITH DIFFERENTIAL/PLATELET - Abnormal; Notable for the following:       Result Value   WBC 19.9 (*)    Neutro Abs 16.3 (*)    All other components within normal limits  COMPREHENSIVE METABOLIC PANEL - Abnormal; Notable for the following:    CO2 21 (*)    Glucose, Bld 157 (*)    All other components within normal limits  URINALYSIS, ROUTINE W REFLEX MICROSCOPIC - Abnormal; Notable for the following:    Hgb urine dipstick SMALL (*)    All other components within normal limits  DIGOXIN LEVEL - Abnormal; Notable for the following:    Digoxin Level 0.3 (*)    All other components within normal limits  I-STAT CG4 LACTIC ACID, ED - Abnormal; Notable for the following:    Lactic Acid, Venous 2.03 (*)    All other components within normal limits  LIPASE, BLOOD  BRAIN NATRIURETIC PEPTIDE  I-STAT CG4 LACTIC ACID, ED    EKG  EKG Interpretation None       Radiology No results found.  Procedures Procedures (including critical care time)  Medications Ordered in ED Medications - No data to  display   Initial Impression / Assessment and Plan / ED Course  I have reviewed the triage vital signs and the nursing notes.  Pertinent labs & imaging results that were available during my care of the patient were reviewed by me and considered in my medical decision making (see chart for details).  Clinical Course    Patient presents with lower abdominal pain since last night with nausea but no vomiting. No fever. States similar to previous kidney stones with denies any urinary symptoms.  Patient has right lower quadrant tenderness on exam. He has a leukocytosis of his labs we'll obtain CT to rule out appendicitis. He declines pain and nausea medication.  CT pending at time of sign out to Dr. Patria Maneampos.     Final Clinical Impressions(s) / ED Diagnoses   Final diagnoses:  None    New Prescriptions New Prescriptions   No medications on file     Glynn OctaveStephen Darrall Strey, MD 07/19/16 [phone]

## 2016-07-19 NOTE — Consult Note (Signed)
CARDIOLOGY CONSULT NOTE   Patient ID: Stanley [name]: [id number] DOB/AGE: 69/04/1946 69 y.o.  Admit date: 07/19/2016  Requesting Physician: Dr. Josem KaufmannKlima Primary Physician:   Noni SaupeEDDING II,[name], MD Primary Cardiologist:  Dr. Shirlee LatchMcLean Reason for Consultation:  Pre op clearance  HPI: Stanley Wells is a 70 y.o. male with a history of CAD s/p CABG (2003), ischemic cardiomyopathy/chronic systolic CHF, severe functional mitral regurgitation, and persistent atrial fibrillation who [facility] with nausea and abdominal pain. Found to have acute appendicitis and cardiology asked for pre operative clearance prior to appendectomy.   Patient had MI then CABG in 2003.  Cardiac cath in 2015 showed only LIMA-LAD and SVG-OM patent.  In 2015, EF was 30-35%.  He had atrial flutter in 2015 and again in 11/16, both times was cardioverted.  After the 11/16 cardioversion, he stopped all his meds.  He was admitted again in 12/16 with atrial fibrillation/RVR and a CHF exacerbation.  He was diuresed and started on Eliquis with plan for DCCV after 4 weeks of anticoagulation.  He was brought back at the end of December for cardioversion, but this was unsuccessful.  TEE in 12/16 showed EF 20-25% with severe functional MR.    He was seen by Dr Cornelius Moraswen with plan for tentative plan for mitral valve repair versus replacement and possible tricuspid valve repair along with MAZE procedure.  He was referred to CHF clinic for optimization prior to surgery.   He went for left and right heart cath. RHC showed good left and right heart filling pressures but cardiac output was low.  Coronary angiography was stable with patent LIMA-LAD and SVG-OM.  Other SVGs occluded.    He had DCCV in 3/17 and is in NSR today on amiodarone.  He had a CPX in 3/17 with only mild functional impairment from CHF.    His most recent echo in 5/17 showed improvement in EF to 40-45% with only trivial MR.    He presented to the ER  today with nausea and abdominal pain since last night and found to have acute appendicitis by CT. No CP or SOB. No LE edema, orthopnea or PND. No dizziness or syncope. No blood in stool or urine. No palpitations.   In the ED he was afebrile T 97.9, BP 187/91, HR 66, RR 16, and oxygen 100% on RA. Labs were significant for a leukocytosis of 19.9 and elevated creatinine 1.13 at his baseline. Venous lactic acid was elevated at 2.03. Lipase and BNP were normal. UA showed small hematuria but negative for nitrites or leukocytes. CT of his abdomen and pelvis revealed acute changes consistent with appendicitis.      Past Medical History:  Diagnosis Date  . CAD (coronary artery disease)    a. s/p CABG 2003  b. s/p acute MI prior to CABG w/ EF <30%  . Cardiomyopathy, ischemic: EF 20-25%   . Cholecystitis   . Chronic combined systolic and diastolic CHF (congestive heart failure) (HCC)   . Chronic systolic CHF (congestive heart failure) (HCC)    a. 2D ECHO: 05/17/2014: EF 30-35%;  b. 07/2015 TEE: EF 20-25%, diff HK, sev MR, sev dil LA w/o thrombus, mod reduced RV fxn, mod dil RA, mod TR.  Marland Kitchen. Hyperlipidemia   . Hypertensive heart disease   . Kidney stones X 1   "passed it" (08/02/2015)  . OSA (obstructive sleep apnea) 10/11/2014   refuses to use CPAP  . Persistent atrial fibrillation (HCC)  a. s/p succesful TEE/DCCV on 06/01/14.  b. on Eliquis;  c. 07/2015 recurrent AF-->Failed DCCV.  . S/P CABG x 5 02/16/2002   LIMA to LAD, SVG to D1, SVG to OM2, Sequential SVG to PDA-RPL, saphenous vein harvest from right thigh and lower leg  . Severe mitral regurgitation   . Tricuspid regurgitation      Past Surgical History:  Procedure Laterality Date  . CARDIAC CATHETERIZATION N/A 06/19/2014   Procedure: RIGHT/LEFT HEART CATH AND CORONARY/GRAFT ANGIOGRAPHY;  Surgeon: Lennette Biharihomas A Kelly, MD;  [facility];  Service: Cardiovascular;  Laterality: N/A;  . CARDIAC CATHETERIZATION  2003  . CARDIAC  CATHETERIZATION N/A 08/28/2015   Procedure: Right Heart Cath and Coronary/Graft Angiography;  Surgeon: Laurey Moralealton S McLean, MD;  [facility];  Service: Cardiovascular;  Laterality: N/A;  . CARDIOVERSION N/A 06/01/2014   Procedure: CARDIOVERSION;  Surgeon: Lars MassonKatarina H Nelson, MD;  [facility];  Service: Cardiovascular;  Laterality: N/A;  . CARDIOVERSION  06/11/2015; 08/02/2015  . CARDIOVERSION N/A 08/02/2015   Procedure: CARDIOVERSION;  Surgeon: Wendall StadePeter C Nishan, MD;  [facility];  Service: Cardiovascular;  Laterality: N/A;  . CARDIOVERSION N/A 10/22/2015   Procedure: CARDIOVERSION;  Surgeon: Laurey Moralealton S McLean, MD;  [facility];  Service: Cardiovascular;  Laterality: N/A;  . CORONARY ARTERY BYPASS GRAFT  02/16/2002   CABG X5  . TEE WITHOUT CARDIOVERSION N/A 06/01/2014   Procedure: TRANSESOPHAGEAL ECHOCARDIOGRAM (TEE);  Surgeon: Lars MassonKatarina H Nelson, MD;  [facility];  Service: Cardiovascular;  Laterality: N/A;  . TEE WITHOUT CARDIOVERSION N/A 08/03/2015   Procedure: TRANSESOPHAGEAL ECHOCARDIOGRAM (TEE);  Surgeon: Lars MassonKatarina H Nelson, MD;  [facility];  Service: Cardiovascular;  Laterality: N/A;    No Known Allergies  I have reviewed the patient's current medications . digoxin  125 mcg Oral Daily  . [START ON 07/20/2016] metoprolol succinate  50 mg Oral BH-q7a  . metoprolol succinate  75 mg Oral QPM  . piperacillin-tazobactam (ZOSYN)  IV  3.375 g Intravenous Q8H  . rosuvastatin  40 mg Oral q1800  . sodium chloride flush  3 mL Intravenous Q12H    acetaminophen **OR** acetaminophen, HYDROmorphone (DILAUDID) injection, ondansetron **OR** ondansetron (ZOFRAN) IV  Prior to Admission medications   Medication Sig Start Date End Date Taking? Authorizing Provider  DIGITEK 125 MCG tablet take 1 tablet by mouth once daily 03/24/16  Yes Laurey Moralealton S McLean, MD  ELIQUIS 5 MG TABS tablet Take 5 mg by mouth 2 (two) times daily. 06/01/15  Yes  Historical Provider, MD  ENTRESTO 24-26 MG take 1 tablet by mouth twice a day 05/09/16  Yes Laurey Moralealton S McLean, MD  isosorbide mononitrate (IMDUR) 30 MG 24 hr tablet take 1 tablet by mouth once daily 06/20/16  Yes Laurey Moralealton S McLean, MD  metoprolol succinate (TOPROL-XL) 25 MG 24 hr tablet Take 2 tabs in AM and 3 tabs in PM 04/11/16  Yes Laurey Moralealton S McLean, MD  rosuvastatin (CRESTOR) 40 MG tablet take 1 tablet by mouth once daily 06/10/16  Yes Laurey Moralealton S McLean, MD  spironolactone (ALDACTONE) 25 MG tablet Take 0.5 tablets (12.5 mg total) by mouth daily. 01/10/16  Yes Amy D Clegg, NP  furosemide (LASIX) 20 MG tablet Take 2 tablets (40 mg total) by mouth as needed (for 3 lb weight gain over night or 5 lb weight gain in a week). Patient not taking: Reported on 07/19/2016 01/10/16   Sherald HessAmy D Clegg, NP     Social History   Social History  .  Marital status: Married    Spouse name: N/A  . Number of children: N/A  . Years of education: N/A   Occupational History  . Retired    Social History Main Topics  . Smoking status: Former Smoker    Packs/day: 1.00    Years: 40.00    Types: Cigarettes    Quit date: 02/02/2002  . Smokeless tobacco: Never Used  . Alcohol use Yes     Comment: 08/02/2015 "I rarely have a beer"  . Drug use: No  . Sexual activity: Yes   Other Topics Concern  . Not on file   Social History Narrative   Lives in [place] with wife.    Family Status  Relation Status  . Mother Deceased at age 22   Cancer  . Father Deceased at age 33   Cancer  . Neg Hx    Family History  Problem Relation Age of Onset  . Cancer Mother   . Cancer Father   . Hypertension Neg Hx   . Stroke Neg Hx        ROS:  Full 14 point review of systems complete and found to be negative unless listed above.  Physical Exam: Blood pressure 136/85, pulse 62, temperature 98.6 F (37 C), temperature source Oral, resp. rate 18, height 5\' 11"  (1.803 m), weight 204 lb 8 oz (92.8 kg), SpO2 98 %.  General: Well developed,  well nourished, male in no acute distress Head: Eyes PERRLA, No xanthomas.   Normocephalic and atraumatic, oropharynx without edema or exudate. :  Lungs: CTAB Heart: HRRR S1 S2, no rub/gallop, Heart regular rate and rhythm with S1, S2  murmur. pulses are 2+ extrem.   Neck: No carotid bruits. No lymphadenopathy. no JVD. Abdomen: Bowel sounds present, abdomen soft and non-tender without masses or hernias noted. Msk:  No spine or cva tenderness. No weakness, no joint deformities or effusions. Extremities: No clubbing or cyanosis.  No edema.  Neuro: Alert and oriented X 3. No focal deficits noted. Psych:  Good affect, responds appropriately Skin: No rashes or lesions noted.  Labs:   Lab Results  Component Value Date   WBC 19.9 (H) 07/19/2016   HGB 14.7 07/19/2016   HCT 44.6 07/19/2016   MCV 94.3 07/19/2016   PLT 199 07/19/2016   No results for input(s): INR in the last 72 hours.  Recent Labs Lab 07/19/16 0500  NA 137  K 4.2  CL 104  CO2 21*  BUN 20  CREATININE 1.13  CALCIUM 9.6  PROT 7.1  BILITOT 1.1  ALKPHOS 66  ALT 36  AST 32  GLUCOSE 157*  ALBUMIN 4.1   Magnesium  Date Value Ref Range Status  08/04/2015 2.2 1.7 - 2.4 mg/dL Final   No results for input(s): CKTOTAL, CKMB, TROPONINI in the last 72 hours. No results for input(s): TROPIPOC in the last 72 hours. Pro B Natriuretic peptide (BNP)  Date/Time Value Ref Range Status  05/29/2014 10:07 AM 3,333.0 (H) 0 - 125 pg/mL Final  05/16/2014 04:14 PM 2,265.0 (H) 0 - 125 pg/mL Final   Lab Results  Component Value Date   CHOL 138 01/10/2016   HDL 39 (L) 01/10/2016   LDLCALC 54 01/10/2016   TRIG 224 (H) 01/10/2016   Lab Results  Component Value Date   DDIMER 0.43 06/13/2015   Lipase  Date/Time Value Ref Range Status  07/19/2016 05:00 AM 16 11 - 51 U/L Final   TSH  Date/Time Value Ref Range Status  04/11/2016  09:30 AM 3.392 0.350 - 4.500 uIU/mL Final  10/03/2014 09:02 AM 1.124 0.350 - 4.500 uIU/mL Final    No results found for: VITAMINB12, FOLATE, FERRITIN, TIBC, IRON, RETICCTPCT  Radiology:  Ct Abdomen Pelvis W Contrast  Result Date: 07/19/2016 CLINICAL DATA:  Mid abdominal pain. EXAM: CT ABDOMEN AND PELVIS WITH CONTRAST TECHNIQUE: Multidetector CT imaging of the abdomen and pelvis was performed using the standard protocol following bolus administration of intravenous contrast. CONTRAST:  ISOVUE-300 IOPAMIDOL (ISOVUE-300) INJECTION 61% COMPARISON:  08/15/2015 FINDINGS: Lower chest: Heart is borderline in size. Diffuse calcifications in the visualized right coronary artery. Lung bases clear. No effusions. Hepatobiliary: Stable cyst in the right hepatic lobe. Gallbladder unremarkable. Pancreas: No focal abnormality or ductal dilatation. Spleen: No focal abnormality.  Normal size. Adrenals/Urinary Tract: Nodules seen within the left adrenal gland, stable. Right adrenal gland unremarkable. No focal renal abnormality or hydronephrosis. Urinary bladder unremarkable. Stomach/Bowel: Scattered descending colonic diverticulosis. No active diverticulitis. The appendix is mildly dilated, measuring up to 13 mm. Appendicolith noted within the proximal appendix. Findings compatible with acute appendicitis. Slight surrounding inflammatory stranding. Vascular/Lymphatic: Aortic and iliac calcifications. No aneurysm. No adenopathy. Reproductive: Mild prostate enlargement. Central prostate calcifications. Other: No free fluid or free air. Musculoskeletal: No acute bony abnormality or focal bone lesion. IMPRESSION: Changes of acute appendicitis. Appendicolith noted within the proximal appendix. Aortoiliac atherosclerosis. Electronically Signed   By: Charlett Nose M.D.   On: 07/19/2016 08:36   Dg Chest Portable 1 View  Result Date: 07/19/2016 CLINICAL DATA:  Preoperative evaluation prior to appendectomy. EXAM: PORTABLE CHEST 1 VIEW COMPARISON:  Chest CTA dated 08/15/2015 and chest radiographs dated 07/13/2015. FINDINGS:  Stable enlarged cardiac silhouette and post CABG changes. Clear lungs with normal vascularity. Mild left shoulder degenerative changes. IMPRESSION: No acute abnormality.  Stable cardiomegaly. Electronically Signed   By: Beckie Salts M.D.   On: 07/19/2016 09:46    ASSESSMENT AND PLAN:    Active Problems:   Acute appendicitis   Acute appendicitis: will require surgery when eliquis adequately washed out. Last dose was last night. Should be okay for tomorrow. Patient with no chest pain and euvolemic. He is cleared for surgery from a cardiac standpoint  Chronic systolic CHF: EF 16-10% on TEE from 12/16.  cMRI 2/17 with EF 19%, unable to assess for delayed enhancement on this study.  Ischemic cardiomyopathy.  Low cardiac output by RHC in 1/17 but filling pressures optimized.  Despite low output on RHC, he has been doing quite well with medical treatment.  CPX with only mild functional limitation.  Most recent echo in 5/17 showed improvement in EF to 40-45%, now outside of ICD range.  NYHA class II.  Volume looks good on exam today.   - He is now using Lasix only prn.  - Imdur, Entresto and spironolactone on hold currently, I do not see any reason to hold these given his normal BPs and renal fucntion.  - Toprol XL 50 qam/75 qpm and digoxin continued   CAD: s/p CABG.  On 1/17 cath, only LIMA-LAD and SVG-OM2 still patent. Not felt to be a candidate for redo CABG - No ASA given use of apixaban.    Atrial fibrillation: Paroxysmal.  Successful DCCV in 3/17 on amiodarone (now off this).  In NSR today.   - Apixiaban on hold for possible surgery.   Mitral regurgitation: Severe by 12/16 TEE, probably functional.  Much improved on 5/17 echo, only trivial.    Signed: Cline Crock, PA-C 07/19/2016 1:53  PM  Pager [phone]  Co-Sign MD   Attending Note:   The patient was seen and examined on Dec. 16, 2017.  Agree with assessment and plan as noted above.  Changes made to the above note as  needed.  Patient seen and independently examined with Carlean Jews, PA .   We discussed all aspects of the encounter. I agree with the assessment and plan as stated above.  Acute appendicitis: will require surgery when eliquis adequately washed out. Last dose was last night. Should be okay for tomorrow. He is at low - moderate risk for complications with surgeyr   Chronic systolic CHF: EF 14-78% on TEE from 12/16.   CAD: s/p CABG.    Atrial fibrillation: Paroxysmal.  Successful DCCV in 3/17 on amiodarone (now off this).  In NSR today.   Eliquis is on hold.   Mitral regurgitation: Severe by 12/16 TEE, probably functional.  Much improved on 5/17 echo, only trivial.     I have spent a total of 40 minutes with patient reviewing hospital  notes , telemetry, EKGs, labs and examining patient as well as establishing an assessment and plan that was discussed with the patient. > 50% of time was spent in direct patient care.    Vesta Mixer, Montez Hageman., MD, [facility] 07/21/2016, 2:49 PM [address]  [address] Office [phone] Pager [phone]

## 2016-07-19 NOTE — Consult Note (Signed)
[facility] Surgery Consult/Admission Note  DENNIE MOLTZ 1946/07/04  [id number].    Requesting MD: Dr. Venora Maples  Chief Complaint/Reason for Consult: Appendicitis  HPI:   Patient is a 70 year old male with a past medical history of HTN, CAD, S/P MI and CABG 2003, iischemic cardiomyopathy/chronic systolic CHF, severe functional mitral regurgitation, and persistent atrial fibrillation on Eliquis who presented to the emergency department with sudden onset, progressively worsening, lower abdominal pain since 11 PM last night. Pain was 8/10, severe, sharp, nonradiating. Nothing made it better. Associated tenesmus and nausea. Patient denies vomiting, fever, chills, chest pain, shortness of breath, melena, hematochezia. Last PO intake was 6 PM last night. Last dose of Eliquis was last night.  Cardiologist: Dr. Aundra Dubin  ROS:  Review of Systems  Constitutional: Negative for chills, diaphoresis, fever and malaise/fatigue.  HENT: Negative for sore throat.   Respiratory: Negative for cough and shortness of breath.   Cardiovascular: Negative for chest pain and leg swelling.  Gastrointestinal: Positive for abdominal pain and nausea. Negative for blood in stool, constipation, diarrhea and vomiting.  Genitourinary: Negative for dysuria.  Neurological: Negative for loss of consciousness.  All other systems reviewed and are negative.    Family History  Problem Relation Age of Onset  . Cancer Mother   . Cancer Father   . Hypertension Neg Hx   . Stroke Neg Hx     Past Medical History:  Diagnosis Date  . CAD (coronary artery disease)    a. s/p CABG 2003  b. s/p acute MI prior to CABG w/ EF <30%  . Cardiomyopathy, ischemic: EF 20-25%   . Cholecystitis   . Chronic combined systolic and diastolic CHF (congestive heart failure) ([place])   . Chronic systolic CHF (congestive heart failure) ([place])    a. 2D ECHO: 05/17/2014: EF 30-35%;  b. 07/2015 TEE: EF 20-25%, diff HK, sev MR, sev dil LA w/o  thrombus, mod reduced RV fxn, mod dil RA, mod TR.  Marland Kitchen Hyperlipidemia   . Hypertensive heart disease   . Kidney stones X 1   "passed it" (08/02/2015)  . OSA (obstructive sleep apnea) 10/11/2014   refuses to use CPAP  . Persistent atrial fibrillation ([place])    a. s/p succesful TEE/DCCV on 06/01/14.  b. on Eliquis;  c. 07/2015 recurrent AF-->Failed DCCV.  . S/P CABG x 5 02/16/2002   LIMA to LAD, SVG to D1, SVG to OM2, Sequential SVG to PDA-RPL, saphenous vein harvest from right thigh and lower leg  . Severe mitral regurgitation   . Tricuspid regurgitation     Past Surgical History:  Procedure Laterality Date  . CARDIAC CATHETERIZATION N/A 06/19/2014   Procedure: RIGHT/LEFT HEART CATH AND CORONARY/GRAFT ANGIOGRAPHY;  Surgeon: Troy Sine, MD;  Location: [facility] CATH LAB;  Service: Cardiovascular;  Laterality: N/A;  . CARDIAC CATHETERIZATION  2003  . CARDIAC CATHETERIZATION N/A 08/28/2015   Procedure: Right Heart Cath and Coronary/Graft Angiography;  Surgeon: Larey Dresser, MD;  Location: [place] CV LAB;  Service: Cardiovascular;  Laterality: N/A;  . CARDIOVERSION N/A 06/01/2014   Procedure: CARDIOVERSION;  Surgeon: Dorothy Spark, MD;  Location: [place];  Service: Cardiovascular;  Laterality: N/A;  . CARDIOVERSION  06/11/2015; 08/02/2015  . CARDIOVERSION N/A 08/02/2015   Procedure: CARDIOVERSION;  Surgeon: Josue Hector, MD;  Location: [facility] ENDOSCOPY;  Service: Cardiovascular;  Laterality: N/A;  . CARDIOVERSION N/A 10/22/2015   Procedure: CARDIOVERSION;  Surgeon: Larey Dresser, MD;  Location: [place];  Service: Cardiovascular;  Laterality: N/A;  .  CORONARY ARTERY BYPASS GRAFT  02/16/2002   CABG X5  . TEE WITHOUT CARDIOVERSION N/A 06/01/2014   Procedure: TRANSESOPHAGEAL ECHOCARDIOGRAM (TEE);  Surgeon: Dorothy Spark, MD;  Location: [place];  Service: Cardiovascular;  Laterality: N/A;  . TEE WITHOUT CARDIOVERSION N/A 08/03/2015   Procedure: TRANSESOPHAGEAL ECHOCARDIOGRAM  (TEE);  Surgeon: Dorothy Spark, MD;  Location: [facility] ENDOSCOPY;  Service: Cardiovascular;  Laterality: N/A;    Social History:  reports that he quit smoking about 14 years ago. His smoking use included Cigarettes. He has a 40.00 pack-year smoking history. He has never used smokeless tobacco. He reports that he drinks alcohol. He reports that he does not use drugs.  Allergies: No Known Allergies   (Not in a hospital admission)  Blood pressure 144/71, pulse (!) 57, temperature 98.4 F (36.9 C), temperature source Oral, resp. rate 16, height '5\' 11"'$  (1.803 m), weight 222 lb (100.7 kg), SpO2 99 %.  Physical Exam: General: pleasant, WD/WN white male who is laying in bed in NAD HEENT: head is normocephalic, atraumatic.  Sclera are noninjected. Oral mucosa is pink and moist Heart: regular rate, irregular rhythm.  No obvious murmurs, gallops, or rubs noted.  2+ radial and DP pulses bilaterally Lungs: CTAB, no wheezes, rhonchi, or rales noted.  Respiratory effort nonlabored Abd: soft, obese, ND, +BS, no hernias noted, moderate TTP to suprapubic region and RLQ MS: all 4 extremities are symmetrical, full ROM, no edema Skin: warm and dry  Psych: A&Ox3 with an appropriate affect. Neuro: CM grossly 2-12 intact, normal speech  Results for orders placed or performed during the hospital encounter of 07/19/16 (from the past 48 hour(s))  CBC with Differential/Platelet     Status: Abnormal   Collection Time: 07/19/16  5:00 AM  Result Value Ref Range   WBC 19.9 (H) 4.0 - 10.5 K/uL   RBC 4.73 4.22 - 5.81 MIL/uL   Hemoglobin 14.7 13.0 - 17.0 g/dL   HCT 44.6 39.0 - 52.0 %   MCV 94.3 78.0 - 100.0 fL   MCH 31.1 26.0 - 34.0 pg   MCHC 33.0 30.0 - 36.0 g/dL   RDW 14.4 11.5 - 15.5 %   Platelets 199 150 - 400 K/uL   Neutrophils Relative % 82 %   Neutro Abs 16.3 (H) 1.7 - 7.7 K/uL   Lymphocytes Relative 13 %   Lymphs Abs 2.5 0.7 - 4.0 K/uL   Monocytes Relative 5 %   Monocytes Absolute 1.0 0.1 - 1.0 K/uL    Eosinophils Relative 0 %   Eosinophils Absolute 0.1 0.0 - 0.7 K/uL   Basophils Relative 0 %   Basophils Absolute 0.0 0.0 - 0.1 K/uL  Comprehensive metabolic panel     Status: Abnormal   Collection Time: 07/19/16  5:00 AM  Result Value Ref Range   Sodium 137 135 - 145 mmol/L   Potassium 4.2 3.5 - 5.1 mmol/L   Chloride 104 101 - 111 mmol/L   CO2 21 (L) 22 - 32 mmol/L   Glucose, Bld 157 (H) 65 - 99 mg/dL   BUN 20 6 - 20 mg/dL   Creatinine, Ser 1.13 0.61 - 1.24 mg/dL   Calcium 9.6 8.9 - 10.3 mg/dL   Total Protein 7.1 6.5 - 8.1 g/dL   Albumin 4.1 3.5 - 5.0 g/dL   AST 32 15 - 41 U/L   ALT 36 17 - 63 U/L   Alkaline Phosphatase 66 38 - 126 U/L   Total Bilirubin 1.1 0.3 - 1.2 mg/dL  GFR calc non Af Amer >60 >60 mL/min   GFR calc Af Amer >60 >60 mL/min    Comment: (NOTE) The eGFR has been calculated using the CKD EPI equation. This calculation has not been validated in all clinical situations. eGFR's persistently <60 mL/min signify possible Chronic Kidney Disease.    Anion gap 12 5 - 15  Lipase, blood     Status: None   Collection Time: 07/19/16  5:00 AM  Result Value Ref Range   Lipase 16 11 - 51 U/L  Digoxin level     Status: Abnormal   Collection Time: 07/19/16  5:00 AM  Result Value Ref Range   Digoxin Level 0.3 (L) 0.8 - 2.0 ng/mL  Brain natriuretic peptide     Status: None   Collection Time: 07/19/16  5:00 AM  Result Value Ref Range   B Natriuretic Peptide 78.2 0.0 - 100.0 pg/mL  I-Stat CG4 Lactic Acid, ED     Status: Abnormal   Collection Time: 07/19/16  5:40 AM  Result Value Ref Range   Lactic Acid, Venous 2.03 (HH) 0.5 - 1.9 mmol/L   Comment NOTIFIED PHYSICIAN   Urinalysis, Routine w reflex microscopic     Status: Abnormal   Collection Time: 07/19/16  5:47 AM  Result Value Ref Range   Color, Urine YELLOW YELLOW   APPearance CLEAR CLEAR   Specific Gravity, Urine 1.018 1.005 - 1.030   pH 5.0 5.0 - 8.0   Glucose, UA NEGATIVE NEGATIVE mg/dL   Hgb urine dipstick  SMALL (A) NEGATIVE   Bilirubin Urine NEGATIVE NEGATIVE   Ketones, ur NEGATIVE NEGATIVE mg/dL   Protein, ur NEGATIVE NEGATIVE mg/dL   Nitrite NEGATIVE NEGATIVE   Leukocytes, UA NEGATIVE NEGATIVE   RBC / HPF 0-5 0 - 5 RBC/hpf   WBC, UA 6-30 0 - 5 WBC/hpf   Bacteria, UA NONE SEEN NONE SEEN   Squamous Epithelial / LPF NONE SEEN NONE SEEN   Mucous PRESENT    Hyaline Casts, UA PRESENT   I-Stat CG4 Lactic Acid, ED     Status: None   Collection Time: 07/19/16  8:39 AM  Result Value Ref Range   Lactic Acid, Venous 1.35 0.5 - 1.9 mmol/L   Ct Abdomen Pelvis W Contrast  Result Date: 07/19/2016 CLINICAL DATA:  Mid abdominal pain. EXAM: CT ABDOMEN AND PELVIS WITH CONTRAST TECHNIQUE: Multidetector CT imaging of the abdomen and pelvis was performed using the standard protocol following bolus administration of intravenous contrast. CONTRAST:  157m ISOVUE-300 IOPAMIDOL (ISOVUE-300) INJECTION 61% COMPARISON:  08/15/2015 FINDINGS: Lower chest: Heart is borderline in size. Diffuse calcifications in the visualized right coronary artery. Lung bases clear. No effusions. Hepatobiliary: Stable cyst in the right hepatic lobe. Gallbladder unremarkable. Pancreas: No focal abnormality or ductal dilatation. Spleen: No focal abnormality.  Normal size. Adrenals/Urinary Tract: Nodules seen within the left adrenal gland, stable. Right adrenal gland unremarkable. No focal renal abnormality or hydronephrosis. Urinary bladder unremarkable. Stomach/Bowel: Scattered descending colonic diverticulosis. No active diverticulitis. The appendix is mildly dilated, measuring up to 13 mm. Appendicolith noted within the proximal appendix. Findings compatible with acute appendicitis. Slight surrounding inflammatory stranding. Vascular/Lymphatic: Aortic and iliac calcifications. No aneurysm. No adenopathy. Reproductive: Mild prostate enlargement. Central prostate calcifications. Other: No free fluid or free air. Musculoskeletal: No acute bony  abnormality or focal bone lesion. IMPRESSION: Changes of acute appendicitis. Appendicolith noted within the proximal appendix. Aortoiliac atherosclerosis. Electronically Signed   By: KRolm BaptiseM.D.   On: 07/19/2016 08:36  Assessment/Plan  Appendicitis - pt admitted to medicine service - pt will likely need surgery but due to his significant cardiac history may try to treat medically and may need to hold off on surgery due to Eliquis - Empiric antibiotic coverage: Zosyn IV 12/16>> - card eval  Cardiac History:  Per primary HTN CAD S/P MI and CABG 2003 iischemic cardiomyopathy/chronic systolic CHF severe functional mitral regurgitation persistent atrial fibrillation on Eliquis   ID: Zosyn 12/16>>  Plan: NPO except sips with meds, pt will likely need surgery but due to his significant cardiac history may try to treat medically, cardiology evaluation will need to happen today, hold Eliquis, NPO sips with meds, IV Zosyn. We will follow this pt, thank you for the consult.   Kalman Drape, [facility] Surgery 07/19/2016, 9:36 AM Pager: [phone] Consults: [phone] Mon-Fri 7:00 am-4:30 pm Sat-Sun 7:00 am-11:30 am

## 2016-07-19 NOTE — Progress Notes (Signed)
Pharmacy Antibiotic Note  Stanley Wells is a 70 y.o. male admitted on 07/19/2016 with appendicitis.  Pharmacy has been consulted for Zosyn dosing.  Pt with history of AFib & significant cardiac history, on Eliquis with last dose on 12/15 PM.  Currently holding Eliquis in anticipation of possible surgery vs medical management.    Plan: Zosyn 3.375g IV q8, infusing over 4hr Watch renal function. F/U plans for OR vs resume anticoagulation   Height: 5\' 11"  (180.3 cm) Weight: 222 lb (100.7 kg) IBW/kg (Calculated) : 75.3  Temp (24hrs), Avg:98.2 F (36.8 C), Min:97.9 F (36.6 C), Max:98.4 F (36.9 C)   Recent Labs Lab 07/14/16 0930 07/19/16 0500 07/19/16 0540 07/19/16 0839  WBC  --  19.9*  --   --   CREATININE 1.13 1.13  --   --   LATICACIDVEN  --   --  2.03* 1.35    Estimated Creatinine Clearance: 74.6 mL/min (by C-G formula based on SCr of 1.13 mg/dL).    No Known Allergies  Antimicrobials this admission: 12/16 Zosyn >>  Dose adjustments this admission: N/A  Microbiology results:   Thank you for allowing pharmacy to be a part of this patient's care.  Marisue HumbleKendra Nakia Koble, PharmD Clinical Pharmacist [facility]

## 2016-07-19 NOTE — ED Notes (Signed)
Pt ambulated to and from restroom; tolerated well 

## 2016-07-20 ENCOUNTER — Encounter (HOSPITAL_COMMUNITY): Payer: Self-pay | Admitting: Certified Registered"

## 2016-07-20 ENCOUNTER — Encounter (HOSPITAL_COMMUNITY)
Admission: EM | Disposition: A | Discharge status: Home / Self Care | Payer: Self-pay | Source: Home / Self Care | Attending: Internal Medicine

## 2016-07-20 ENCOUNTER — Inpatient Hospital Stay (HOSPITAL_COMMUNITY): Payer: Medicare Other | Admitting: Certified Registered"

## 2016-07-20 DIAGNOSIS — Z7901 Long term (current) use of anticoagulants: Secondary | ICD-10-CM

## 2016-07-20 DIAGNOSIS — Z800 Family history of malignant neoplasm of digestive organs: Secondary | ICD-10-CM

## 2016-07-20 DIAGNOSIS — Z8051 Family history of malignant neoplasm of kidney: Secondary | ICD-10-CM

## 2016-07-20 DIAGNOSIS — I2510 Atherosclerotic heart disease of native coronary artery without angina pectoris: Secondary | ICD-10-CM

## 2016-07-20 DIAGNOSIS — K353 Acute appendicitis with localized peritonitis, without perforation or gangrene: Secondary | ICD-10-CM

## 2016-07-20 DIAGNOSIS — I255 Ischemic cardiomyopathy: Secondary | ICD-10-CM

## 2016-07-20 DIAGNOSIS — I481 Persistent atrial fibrillation: Secondary | ICD-10-CM

## 2016-07-20 DIAGNOSIS — I110 Hypertensive heart disease with heart failure: Secondary | ICD-10-CM

## 2016-07-20 DIAGNOSIS — Z951 Presence of aortocoronary bypass graft: Secondary | ICD-10-CM

## 2016-07-20 DIAGNOSIS — E785 Hyperlipidemia, unspecified: Secondary | ICD-10-CM

## 2016-07-20 DIAGNOSIS — I5042 Chronic combined systolic (congestive) and diastolic (congestive) heart failure: Secondary | ICD-10-CM

## 2016-07-20 DIAGNOSIS — I5022 Chronic systolic (congestive) heart failure: Secondary | ICD-10-CM

## 2016-07-20 DIAGNOSIS — Z79899 Other long term (current) drug therapy: Secondary | ICD-10-CM

## 2016-07-20 DIAGNOSIS — Z9089 Acquired absence of other organs: Secondary | ICD-10-CM

## 2016-07-20 HISTORY — PX: LAPAROSCOPIC APPENDECTOMY: SHX408

## 2016-07-20 LAB — BASIC METABOLIC PANEL
Anion gap: 10 (ref 5–15)
BUN: 16 mg/dL (ref 6–20)
CALCIUM: 9.2 mg/dL (ref 8.9–10.3)
CO2: 24 mmol/L (ref 22–32)
CREATININE: 1.18 mg/dL (ref 0.61–1.24)
Chloride: 102 mmol/L (ref 101–111)
GFR calc non Af Amer: >60 mL/min (ref >60)
Glucose, Bld: 142 mg/dL — ABNORMAL HIGH (ref 65–99)
Potassium: 3.8 mmol/L (ref 3.5–5.1)
SODIUM: 136 mmol/L (ref 135–145)

## 2016-07-20 LAB — CBC
HCT: 42.6 % (ref 39.0–52.0)
Hemoglobin: 14.1 g/dL (ref 13.0–17.0)
MCH: 30.8 pg (ref 26.0–34.0)
MCHC: 33.1 g/dL (ref 30.0–36.0)
MCV: 93 fL (ref 78.0–100.0)
PLATELETS: 195 10*3/uL (ref 150–400)
RBC: 4.58 MIL/uL (ref 4.22–5.81)
RDW: 14.3 % (ref 11.5–15.5)
WBC: 22.7 10*3/uL — ABNORMAL HIGH (ref 4.0–10.5)

## 2016-07-20 SURGERY — APPENDECTOMY, LAPAROSCOPIC
Anesthesia: General | Site: Abdomen

## 2016-07-20 MED ORDER — PROPOFOL 10 MG/ML IV BOLUS
INTRAVENOUS | Status: DC | PRN
  Administered 2016-07-20: 150 mg via INTRAVENOUS

## 2016-07-20 MED ORDER — SUGAMMADEX SODIUM 200 MG/2ML IV SOLN
INTRAVENOUS | Status: AC
  Filled 2016-07-20: qty 2

## 2016-07-20 MED ORDER — ONDANSETRON HCL 4 MG/2ML IJ SOLN
INTRAMUSCULAR | Status: AC
  Filled 2016-07-20: qty 2

## 2016-07-20 MED ORDER — OXYCODONE HCL 5 MG PO TABS
5.0000 mg | ORAL_TABLET | ORAL | Status: DC | PRN
  Administered 2016-07-20 – 2016-07-21 (×2): 10 mg via ORAL
  Filled 2016-07-20 (×3): qty 2

## 2016-07-20 MED ORDER — LIDOCAINE 2% (20 MG/ML) 5 ML SYRINGE
INTRAMUSCULAR | Status: DC | PRN
  Administered 2016-07-20: 60 mg via INTRAVENOUS

## 2016-07-20 MED ORDER — SUCCINYLCHOLINE CHLORIDE 200 MG/10ML IV SOSY
PREFILLED_SYRINGE | INTRAVENOUS | Status: AC
  Filled 2016-07-20: qty 10

## 2016-07-20 MED ORDER — HYDROMORPHONE HCL 2 MG/ML IJ SOLN
1.0000 mg | INTRAMUSCULAR | Status: DC | PRN
  Administered 2016-07-20 – 2016-07-22 (×3): 1 mg via INTRAVENOUS
  Filled 2016-07-20 (×3): qty 1

## 2016-07-20 MED ORDER — LACTATED RINGERS IV SOLN
INTRAVENOUS | Status: DC | PRN
  Administered 2016-07-20: 08:00:00 via INTRAVENOUS

## 2016-07-20 MED ORDER — ISOSORBIDE MONONITRATE ER 30 MG PO TB24
30.0000 mg | ORAL_TABLET | Freq: Every day | ORAL | Status: DC
  Administered 2016-07-20 – 2016-07-25 (×6): 30 mg via ORAL
  Filled 2016-07-20 (×6): qty 1

## 2016-07-20 MED ORDER — MORPHINE SULFATE (PF) 2 MG/ML IV SOLN
2.0000 mg | Freq: Once | INTRAVENOUS | Status: AC
  Administered 2016-07-20: 2 mg via INTRAVENOUS
  Filled 2016-07-20: qty 1

## 2016-07-20 MED ORDER — ROCURONIUM BROMIDE 10 MG/ML (PF) SYRINGE
PREFILLED_SYRINGE | INTRAVENOUS | Status: DC | PRN
  Administered 2016-07-20: 35 mg via INTRAVENOUS

## 2016-07-20 MED ORDER — ROCURONIUM BROMIDE 50 MG/5ML IV SOSY
PREFILLED_SYRINGE | INTRAVENOUS | Status: AC
  Filled 2016-07-20: qty 5

## 2016-07-20 MED ORDER — BUPIVACAINE-EPINEPHRINE (PF) 0.25% -1:200000 IJ SOLN
INTRAMUSCULAR | Status: AC
  Filled 2016-07-20: qty 30

## 2016-07-20 MED ORDER — FENTANYL CITRATE (PF) 100 MCG/2ML IJ SOLN
INTRAMUSCULAR | Status: AC
  Filled 2016-07-20: qty 4

## 2016-07-20 MED ORDER — PROMETHAZINE HCL 25 MG/ML IJ SOLN: 6.2500 mg | INTRAMUSCULAR | Status: DC | PRN

## 2016-07-20 MED ORDER — SACUBITRIL-VALSARTAN 24-26 MG PO TABS: 1.0000 | ORAL_TABLET | Freq: Two times a day (BID) | ORAL | Status: DC

## 2016-07-20 MED ORDER — FENTANYL CITRATE (PF) 100 MCG/2ML IJ SOLN
INTRAMUSCULAR | Status: DC | PRN
  Administered 2016-07-20 (×2): 100 ug via INTRAVENOUS

## 2016-07-20 MED ORDER — PROPOFOL 10 MG/ML IV BOLUS
INTRAVENOUS | Status: AC
  Filled 2016-07-20: qty 20

## 2016-07-20 MED ORDER — SPIRONOLACTONE 25 MG PO TABS
12.5000 mg | ORAL_TABLET | Freq: Every day | ORAL | Status: DC
  Administered 2016-07-20: 12.5 mg via ORAL
  Filled 2016-07-20: qty 1

## 2016-07-20 MED ORDER — SODIUM CHLORIDE 0.9 % IR SOLN
Status: DC | PRN
  Administered 2016-07-20: 1000 mL

## 2016-07-20 MED ORDER — LIDOCAINE 2% (20 MG/ML) 5 ML SYRINGE
INTRAMUSCULAR | Status: AC
  Filled 2016-07-20: qty 5

## 2016-07-20 MED ORDER — HYDROMORPHONE HCL 1 MG/ML IJ SOLN: 0.2500 mg | INTRAMUSCULAR | Status: DC | PRN

## 2016-07-20 MED ORDER — ONDANSETRON HCL 4 MG/2ML IJ SOLN
INTRAMUSCULAR | Status: DC | PRN
  Administered 2016-07-20: 4 mg via INTRAVENOUS

## 2016-07-20 MED ORDER — SACUBITRIL-VALSARTAN 24-26 MG PO TABS
1.0000 | ORAL_TABLET | Freq: Two times a day (BID) | ORAL | Status: DC
  Administered 2016-07-20 – 2016-07-22 (×5): 1 via ORAL
  Filled 2016-07-20 (×6): qty 1

## 2016-07-20 MED ORDER — MIDAZOLAM HCL 2 MG/2ML IJ SOLN
INTRAMUSCULAR | Status: AC
  Filled 2016-07-20: qty 2

## 2016-07-20 MED ORDER — ISOSORBIDE MONONITRATE ER 30 MG PO TB24: 30.0000 mg | ORAL_TABLET | Freq: Every day | ORAL | Status: DC

## 2016-07-20 MED ORDER — 0.9 % SODIUM CHLORIDE (POUR BTL) OPTIME
TOPICAL | Status: DC | PRN
  Administered 2016-07-20: 1000 mL

## 2016-07-20 MED ORDER — SUCCINYLCHOLINE CHLORIDE 200 MG/10ML IV SOSY
PREFILLED_SYRINGE | INTRAVENOUS | Status: DC | PRN
  Administered 2016-07-20: 120 mg via INTRAVENOUS

## 2016-07-20 MED ORDER — BUPIVACAINE-EPINEPHRINE 0.25% -1:200000 IJ SOLN
INTRAMUSCULAR | Status: DC | PRN
  Administered 2016-07-20: 6 mL

## 2016-07-20 SURGICAL SUPPLY — 42 items
APL SKNCLS STERI-STRIP NONHPOA (GAUZE/BANDAGES/DRESSINGS) ×1
APPLIER CLIP ROT 10 11.4 M/L (STAPLE)
BENZOIN TINCTURE PRP APPL 2/3 (GAUZE/BANDAGES/DRESSINGS) ×3 IMPLANT
BLADE SURG ROTATE 9660 (MISCELLANEOUS) IMPLANT
CANISTER SUCTION 2500CC (MISCELLANEOUS) ×3 IMPLANT
CHLORAPREP W/TINT 26ML (MISCELLANEOUS) ×3 IMPLANT
CLIP APPLIE ROT 10 11.4 M/L (STAPLE) IMPLANT
CLOSURE WOUND 1/2 X4 (GAUZE/BANDAGES/DRESSINGS) ×1
COVER SURGICAL LIGHT HANDLE (MISCELLANEOUS) ×3 IMPLANT
CUTTER FLEX LINEAR 45M (STAPLE) ×6 IMPLANT
DRSG TEGADERM 2-3/8X2-3/4 SM (GAUZE/BANDAGES/DRESSINGS) ×3 IMPLANT
DRSG TEGADERM 4X4.75 (GAUZE/BANDAGES/DRESSINGS) ×3 IMPLANT
ELECT REM PT RETURN 9FT ADLT (ELECTROSURGICAL) ×3
ELECTRODE REM PT RTRN 9FT ADLT (ELECTROSURGICAL) ×1 IMPLANT
ENDOLOOP SUT PDS II  0 18 (SUTURE)
ENDOLOOP SUT PDS II 0 18 (SUTURE) IMPLANT
FILTER SMOKE EVAC LAPAROSHD (FILTER) ×3 IMPLANT
GAUZE SPONGE 2X2 8PLY STRL LF (GAUZE/BANDAGES/DRESSINGS) ×1 IMPLANT
GLOVE BIO SURGEON STRL SZ7 (GLOVE) ×3 IMPLANT
GLOVE BIOGEL PI IND STRL 7.5 (GLOVE) ×1 IMPLANT
GLOVE BIOGEL PI INDICATOR 7.5 (GLOVE) ×2
GOWN STRL REUS W/ TWL LRG LVL3 (GOWN DISPOSABLE) ×3 IMPLANT
GOWN STRL REUS W/TWL LRG LVL3 (GOWN DISPOSABLE) ×9
KIT BASIN OR (CUSTOM PROCEDURE TRAY) ×3 IMPLANT
KIT ROOM TURNOVER OR (KITS) ×3 IMPLANT
NS IRRIG 1000ML POUR BTL (IV SOLUTION) ×3 IMPLANT
PAD ARMBOARD 7.5X6 YLW CONV (MISCELLANEOUS) ×6 IMPLANT
POUCH SPECIMEN RETRIEVAL 10MM (ENDOMECHANICALS) ×3 IMPLANT
RELOAD STAPLE TA45 3.5 REG BLU (ENDOMECHANICALS) ×6 IMPLANT
SCALPEL HARMONIC ACE (MISCELLANEOUS) ×3 IMPLANT
SET IRRIG TUBING LAPAROSCOPIC (IRRIGATION / IRRIGATOR) ×3 IMPLANT
SPECIMEN JAR SMALL (MISCELLANEOUS) ×3 IMPLANT
SPONGE GAUZE 2X2 STER 10/PKG (GAUZE/BANDAGES/DRESSINGS) ×2
STRIP CLOSURE SKIN 1/2X4 (GAUZE/BANDAGES/DRESSINGS) ×2 IMPLANT
SUT MNCRL AB 4-0 PS2 18 (SUTURE) ×3 IMPLANT
TAPE STRIPS DRAPE STRL (GAUZE/BANDAGES/DRESSINGS) ×3 IMPLANT
TOWEL OR 17X24 6PK STRL BLUE (TOWEL DISPOSABLE) ×3 IMPLANT
TOWEL OR 17X26 10 PK STRL BLUE (TOWEL DISPOSABLE) ×3 IMPLANT
TRAY LAPAROSCOPIC MC (CUSTOM PROCEDURE TRAY) ×3 IMPLANT
TROCAR XCEL BLADELESS 5X75MML (TROCAR) ×6 IMPLANT
TROCAR XCEL BLUNT TIP 100MML (ENDOMECHANICALS) ×3 IMPLANT
TUBING INSUFFLATION (TUBING) ×3 IMPLANT

## 2016-07-20 NOTE — Op Note (Signed)
Appendectomy, Lap, Procedure Note  Indications: The patient presented with a history of right-sided abdominal pain. A CT scan revealed findings consistent with acute appendicitis.  Surgery was delayed because of anticoagulation on Eliquis.  His WBC has increased today and he is more tender.  He has been cleared by cardiology.  Pre-operative Diagnosis: Acute appendicitis without mention of peritonitis  Post-operative Diagnosis: Perforated appendicitis with small appendiceal abscess  Surgeon: Stanley RuddSUEI,Migdalia Olejniczak K.   Assistants: none  Anesthesia: General endotracheal anesthesia  ASA Class: 2  Procedure Details  The patient was seen again in the Holding Room. The risks, benefits, complications, treatment options, and expected outcomes were discussed with the patient and/or family. The possibilities of reaction to medication, perforation of viscus, bleeding, recurrent infection, finding a normal appendix, the need for additional procedures, failure to diagnose a condition, and creating a complication requiring transfusion or operation were discussed. There was concurrence with the proposed plan and informed consent was obtained. The site of surgery was properly noted. The patient was taken to Operating Room, identified as Stanley Wells and the procedure verified as Appendectomy. A Time Out was held and the above information confirmed.  The patient was placed in the supine position and general anesthesia was induced.  The abdomen was prepped and draped in a sterile fashion. A one centimeter infraumbilical incision was made.  Dissection was carried down to the fascia bluntly.  The fascia was incised vertically.  We entered the peritoneal cavity bluntly.  A pursestring suture was passed around the incision with a 0 Vicryl.  The Hasson cannula was introduced into the abdomen and the tails of the suture were used to hold the Hasson in place.   The pneumoperitoneum was then established maintaining a maximum  pressure of 15 mmHg.  Additional 5 mm cannulas then placed in the left lower quadrant of the abdomen and the right upper quadrant under direct visualization. A careful evaluation of the entire abdomen was carried out. The patient was placed in Trendelenburg and left lateral decubitus position.  The scope was moved to the right upper quadrant port site. The cecum was mobilized medially.  There is some fibrinous exudate noted in the RLQ.  The appendix was identified and showed signs of perforation with necrosis and a small amount of purulent fluid.  The appendix was carefully dissected. The appendix was was skeletonized with the harmonic scalpel.   The appendix was divided at its base using an endo-GIA stapler. Minimal appendiceal stump was left in place. There was no evidence of bleeding, leakage, or complication after division of the appendix. Irrigation was also performed and irrigate suctioned from the abdomen as well.  No further purulent fluid was noted.  The umbilical port site was closed with the purse string suture. There was no residual palpable fascial defect.  The trocar site skin wounds were closed with 4-0 Monocryl.  Instrument, sponge, and needle counts were correct at the conclusion of the case.   Findings: The appendix was found to be inflamed. There were signs of necrosis.  There was perforation. There was abscess formation.  Estimated Blood Loss:  less than 50 mL         Drains: none         Specimens: appendix         Complications:  None; patient tolerated the procedure well.         Disposition: PACU - hemodynamically stable.         Condition: stable  Wilmon ArmsMatthew K.  Corliss Skainssuei, MD, [facility] [place] [facility]  General/ Trauma Surgery  07/20/2016 9:17 AM

## 2016-07-20 NOTE — H&P (Signed)
Internal Medicine Attending Admission Note Date: 07/20/2016  Patient name: Stanley Wells Medical record number: [id number] Date of birth: 09/14/1945 Age: 70 y.o. Gender: male  I saw and evaluated the patient. I reviewed the resident's note and I agree with the resident's findings and plan as documented in the resident's note.  Chief Complaint(s): Abdominal pain 12 hours  History - key components related to admission:  Stanley Wells is a 70 year old man with a history of coronary artery disease status post coronary artery bypass graft 5 in 2003, ischemic cardiomyopathy with combined systolic and diastolic heart failure, persistent atrial fibrillation, hypertension, and hyperlipidemia who presents with the acute onset of abdominal pain that began at 11 PM the evening prior to admission. He characterizes the pain as initially intermittent but eventually constant and dull. The pain was associated with nausea but no vomiting, fevers, or diarrhea. In the emergency department he was found to have a leukocytosis of 19.9, a lactic acid of 2.03, and an abdominal and pelvic CT scan that was consistent with acute appendicitis. Because he was on Eliquis for atrial fibrillation anticoagulation his surgery was delayed until today. He successfully underwent a laparoscopic appendectomy which demonstrated a perforation of the appendix and associated abscess. He tolerated the procedure well without immediate complications.  When seen on rounds postoperatively he was sitting in a chair feeling well. He stated he had some abdominal tenderness but was much improved from the preoperative pain. He has not passed any gas, but was looking forward to starting a liquid diet. He was without other complaints at that time.  Physical Exam - key components related to admission:  Vitals:   07/20/16 0924 07/20/16 0937 07/20/16 0955 07/20/16 1035  BP: (!) 159/94 (!) 162/95 (!) 161/77   Pulse: 72 80 82 84  Resp: (!) 21 20 20     Temp:  98.9 F (37.2 C) 99.5 F (37.5 C)   TempSrc:      SpO2: 98% 97% 95%   Weight:      Height:       Gen.: Well-developed, well-nourished, man sitting comfortably in a chair in no acute distress. Abdomen: Soft, mild tenderness, active bowel sounds. Port incision sites clean and dry.  Lab results:  Basic Metabolic Panel:  Recent Labs  82/95/6212/16/17 0500 07/20/16 0348  NA 137 136  K 4.2 3.8  CL 104 102  CO2 21* 24  GLUCOSE 157* 142*  BUN 20 16  CREATININE 1.13 1.18  CALCIUM 9.6 9.2   Liver Function Tests:  Recent Labs  07/19/16 0500  AST 32  ALT 36  ALKPHOS 66  BILITOT 1.1  PROT 7.1  ALBUMIN 4.1    Recent Labs  07/19/16 0500  LIPASE 16   CBC:  Recent Labs  07/19/16 0500 07/20/16 0348  WBC 19.9* 22.7*  NEUTROABS 16.3*  --   HGB 14.7 14.1  HCT 44.6 42.6  MCV 94.3 93.0  PLT 199 195   Urinalysis:  Clear, yellow, specific gravity 1.018, pH 5.0, small hemoglobin, negative nitrite, negative leukocytes, 0-5 red blood cells per high-power field, 6-30 white blood cells per high-power field.  Misc. Labs:  Digoxin 0.3 BNP 78.2 Lactic acid 2.03 -> 1.35  Imaging results:  Ct Abdomen Pelvis W Contrast  Result Date: 07/19/2016 CLINICAL DATA:  Mid abdominal pain. EXAM: CT ABDOMEN AND PELVIS WITH CONTRAST TECHNIQUE: Multidetector CT imaging of the abdomen and pelvis was performed using the standard protocol following bolus administration of intravenous contrast. CONTRAST:  100mL ISOVUE-300 IOPAMIDOL (ISOVUE-300)  INJECTION 61% COMPARISON:  08/15/2015 FINDINGS: Lower chest: Heart is borderline in size. Diffuse calcifications in the visualized right coronary artery. Lung bases clear. No effusions. Hepatobiliary: Stable cyst in the right hepatic lobe. Gallbladder unremarkable. Pancreas: No focal abnormality or ductal dilatation. Spleen: No focal abnormality.  Normal size. Adrenals/Urinary Tract: Nodules seen within the left adrenal gland, stable. Right adrenal gland  unremarkable. No focal renal abnormality or hydronephrosis. Urinary bladder unremarkable. Stomach/Bowel: Scattered descending colonic diverticulosis. No active diverticulitis. The appendix is mildly dilated, measuring up to 13 mm. Appendicolith noted within the proximal appendix. Findings compatible with acute appendicitis. Slight surrounding inflammatory stranding. Vascular/Lymphatic: Aortic and iliac calcifications. No aneurysm. No adenopathy. Reproductive: Mild prostate enlargement. Central prostate calcifications. Other: No free fluid or free air. Musculoskeletal: No acute bony abnormality or focal bone lesion. IMPRESSION: Changes of acute appendicitis. Appendicolith noted within the proximal appendix. Aortoiliac atherosclerosis. Electronically Signed   By: Charlett NoseKevin  Dover M.D.   On: 07/19/2016 08:36   Dg Chest Portable 1 View  Result Date: 07/19/2016 CLINICAL DATA:  Preoperative evaluation prior to appendectomy. EXAM: PORTABLE CHEST 1 VIEW COMPARISON:  Chest CTA dated 08/15/2015 and chest radiographs dated 07/13/2015. FINDINGS: Stable enlarged cardiac silhouette and post CABG changes. Clear lungs with normal vascularity. Mild left shoulder degenerative changes. IMPRESSION: No acute abnormality.  Stable cardiomegaly. Electronically Signed   By: Beckie SaltsSteven  Reid M.D.   On: 07/19/2016 09:46   Other results:  EKG: Normal sinus rhythm at 66 bpm, normal axis, normal intervals, no significant Q waves, no LVH by voltage, delayed R wave progression, no ST segment changes, nonspecific T-wave flattening. No significant changes from the previous ECG on 07/14/2016.  Assessment & Plan by Problem:  Stanley Wells is a 70 year old man with a history of coronary artery disease status post coronary artery bypass graft 5 in 2003, ischemic cardiomyopathy with combined systolic and diastolic heart failure, persistent atrial fibrillation, hypertension, and hyperlipidemia who presents with the acute onset of abdominal pain that  began at 11 PM the evening prior to admission. He was found to have an acute appendicitis and successfully underwent a laparoscopic appendectomy this morning which demonstrated a perforation and appendiceal abscess. He is doing well immediately postop.  1) Acute appendicitis status post laparoscopic appendectomy: His diet will be advanced as tolerated per recommendations from surgery. We will continue the antibiotics and likely send him home on an oral regimen. We will inquire when from a surgical standpoint we can restart the anticoagulation for his atrial fibrillation.  2) Cardiac disease: We will resume his preoperative cardiac regimen with the exception of the anticoagulation for which we will determine when surgery feels we can restart it from their standpoint.  3) Disposition: I to he will be stable for discharge home as long as he is tolerating a diet and moving his bowels. This should be within the next 24-48 hours.

## 2016-07-20 NOTE — Progress Notes (Signed)
   Subjective: Patient was seen after surgery today and was sitting up in the chair beside the bed. Reports he feels well aside from some abdominal soreness. He has not yet attempted any PO but was told by his surgeon that he could try liquids for lunch. Pain is well controlled.   Objective:  Vital signs in last 24 hours: Vitals:   07/20/16 0937 07/20/16 0955 07/20/16 1035 07/20/16 1402  BP: (!) 162/95 (!) 161/77  (!) 156/76  Pulse: 80 82 84 83  Resp: 20 20  18   Temp: 98.9 F (37.2 C) 99.5 F (37.5 C)  98.8 F (37.1 C)  TempSrc:    Oral  SpO2: 97% 95%  97%  Weight:  207 lb 7.3 oz (94.1 kg)    Height:       Physical Exam Constitutional: NAD, appears comfortable, sitting up in chair  Cardiovascular: RRR, no murmurs, rubs, or gallops.  Pulmonary/Chest: CTAB, no wheezes, rales, or rhonchi.   Abdominal: Post surgical bandages in place, c/d/i. Abdomen is distended but soft and only mildly tender to palpation. +BS.  Extremities: Warm and well perfused. Distal pulses intact. No edema.  Neurological: A&Ox3, CN II - XII grossly intact.  Psychiatric: Normal mood and affect  Assessment/Plan:  Acute Appendicitis: S/p laparoscopic appendectomy this morning by Dr. Corliss Skainssuei. Patient has remained afebrile but leukocytosis has persisted today, 19.9 -> 22.7 on lab check this morning prior to surgery. Per op note, the appendix was found to show signs of perforation with necrosis and a small amount of surrounding purulent fluid. The surgery was completed without complication and patient seems to be doing very well post operatively. Plan is to slowly advance diet and continue IV antibiotics. Will discuss with surgery the plan to resume his Eliquis. Patient will likely need to complete a course of antibiotics on discharged due the purulent drainage found in the OR.  -- Surgery following, appreciate recommendations  -- Cardiology consult; appreciate recommendations  -- Dilaudid 1 mg q2 hours prn for pain  --  Oxycodone 5-10 mg q4 hours prn for pain  -- Zofran 4 mg q6 hours prn for nausea -- Continue IV Zosyn q8 hours  -- Advance diet as tolerated  Chronic Combined Systolic and Diastolic Heart Failure: Secondary to ischemic cardiomyopathy. Follows with Dr. Shirlee LatchMcLean. Currently well compensated and medically optimized.  -- Continue metoprolol and digoxin -- Restarted home imdur, spironolactone, and entresto today  Atrial Fibrillation/Atrial Flutter: Persistent a-fib with hx of atrial flutter in 2015 requiring DC cardioversion. Rate controlled on metoprolol, amiodarone, and digoxin. He is also on Eliquis.  -- Continue digoxin 125 mcg daily  -- Continue metoprolol 50 mg qAM and 75 mg QHS -- Waiting call back from surgery for recommendations on restarting Eliquis; will likely plan to restart tomorrow   HLD: -- Continue crestor 40 mg daily   FEN: Fluids discontinued, replete lytes prn, advance as tolerated VTE ppx: SCDs, holding Eliquis  Code Status: FULL  Dispo: Anticipated discharge tomorrow pending advancement in diet and return of bowl function.   Stanley Pollarolyn Ganesh Deeg, MD 07/20/2016, 2:08 PM Pager: [phone]

## 2016-07-20 NOTE — Evaluation (Signed)
Physical Therapy Evaluation Patient Details Name: Stanley FreibergJames C Nicolls MRN: [id number] DOB: 09/04/1945 Today's Date: 07/20/2016   History of Present Illness  70 yo male admitted via ED on 07/19/16 with RLQ abdominal pain and was diagnosed with acute appendicitis. Pt underwent appendectomy via laparoscopy on 07/20/16. PMH significant for HTN, HLD, ischemia cardiomyopathy, chronic diastolic and systolic CHF, Atrial Fib, CABG x5 2003.   Clinical Impression  Pt is POD 0 following appendectomy. Prior to admission, pt was completely independent including driving and performed household chores and yard work. Currently, pt presents with the below deficits and will benefit from continuing to be seen acutely in order to maximize his functional outcomes. Pt does not require follow-up following discharge unless he has more of functional decline during his hospitalization. Pt will need to work on stair negotiation prior to discharge.     Follow Up Recommendations No PT follow up    Equipment Recommendations  None recommended by PT    Recommendations for Other Services       Precautions / Restrictions Precautions Precautions: None Restrictions Weight Bearing Restrictions: No      Mobility  Bed Mobility Overal bed mobility: Needs Assistance Bed Mobility: Supine to Sit     Supine to sit: Supervision     General bed mobility comments: supervision for safety with first attempt  Transfers Overall transfer level: Needs assistance Equipment used: None Transfers: Sit to/from Stand Sit to Stand: Supervision         General transfer comment: supervision for safety from EOB  Ambulation/Gait Ambulation/Gait assistance: Min guard Ambulation Distance (Feet): 250 Feet Assistive device: None Gait Pattern/deviations: Step-through pattern Gait velocity: decreased Gait velocity interpretation: Below normal speed for age/gender General Gait Details: decreased cadence, wide base of support, requires  assistance to maneuver IV pole  Stairs            Wheelchair Mobility    Modified Rankin (Stroke Patients Only)       Balance Overall balance assessment: No apparent balance deficits (not formally assessed)                                           Pertinent Vitals/Pain Pain Assessment: 0-10 Pain Score: 7  Pain Location: RLQ Pain Descriptors / Indicators: Sore Pain Intervention(s): Monitored during session    Home Living Family/patient expects to be discharged to:: Private residence Living Arrangements: Spouse/significant other Available Help at Discharge: Family;Available 24 hours/day Type of Home: House Home Access: Stairs to enter Entrance Stairs-Rails: Right;Left;Can reach both Entrance Stairs-Number of Steps: 2 Home Layout: One level Home Equipment: None      Prior Function Level of Independence: Independent         Comments: working in yard and driving prior to admission     Hand Dominance   Dominant Hand: Right    Extremity/Trunk Assessment   Upper Extremity Assessment Upper Extremity Assessment: Defer to OT evaluation    Lower Extremity Assessment Lower Extremity Assessment: Overall WFL for tasks assessed    Cervical / Trunk Assessment Cervical / Trunk Assessment: Normal  Communication   Communication: No difficulties  Cognition Arousal/Alertness: Awake/alert Behavior During Therapy: WFL for tasks assessed/performed Overall Cognitive Status: Within Functional Limits for tasks assessed                      General Comments      Exercises  Assessment/Plan    PT Assessment Patient needs continued PT services  PT Problem List Decreased mobility;Decreased balance;Decreased activity tolerance          PT Treatment Interventions Gait training;Stair training;Functional mobility training;Therapeutic activities;Therapeutic exercise;Balance training;Patient/family education    PT Goals (Current goals can  be found in the Care Plan section)  Acute Rehab PT Goals Patient Stated Goal: to get home PT Goal Formulation: With patient Time For Goal Achievement: 07/27/16 Potential to Achieve Goals: Good    Frequency Min 3X/week   Barriers to discharge        Co-evaluation               End of Session Equipment Utilized During Treatment: Gait belt Activity Tolerance: Patient tolerated treatment well;Patient limited by pain Patient left: in chair;with call bell/phone within reach Nurse Communication: Mobility status         Time: [id number] PT Time Calculation (min) (ACUTE ONLY): 13 min   Charges:   PT Evaluation $PT Eval Low Complexity: 1 Procedure     PT G Codes:        Colin BroachSabra M. Mariha Sleeper PT, DPT  [phone]  07/20/2016, 3:49 PM

## 2016-07-20 NOTE — Anesthesia Procedure Notes (Signed)
Procedure Name: Intubation Date/Time: 07/20/2016 8:13 AM Performed by: Charm BargesBUTLER, Arienna Benegas R Pre-anesthesia Checklist: Patient identified, Emergency Drugs available, Suction available and Patient being monitored Patient Re-evaluated:Patient Re-evaluated prior to inductionOxygen Delivery Method: Circle System Utilized Preoxygenation: Pre-oxygenation with 100% oxygen Intubation Type: IV induction Laryngoscope Size: Mac and 3 Grade View: Grade I Tube type: Oral Tube size: 7.5 mm Number of attempts: 1 Airway Equipment and Method: Stylet Placement Confirmation: ETT inserted through vocal cords under direct vision,  positive ETCO2 and breath sounds checked- equal and bilateral Secured at: 21 cm Tube secured with: Tape Dental Injury: Teeth and Oropharynx as per pre-operative assessment

## 2016-07-20 NOTE — Anesthesia Preprocedure Evaluation (Addendum)
Anesthesia Evaluation  Patient identified by MRN, date of birth, ID band Patient awake    Airway Mallampati: I  TM Distance: >3 FB Neck ROM: Full    Dental  (+) Teeth Intact, Dental Advisory Given   Pulmonary sleep apnea , former smoker,    breath sounds clear to auscultation       Cardiovascular hypertension, + CAD and +CHF  + dysrhythmias + Valvular Problems/Murmurs MR  Rhythm:Regular Rate:Normal  Cardiac status and low EF well outlined in chart.   Neuro/Psych    GI/Hepatic Acute appy   Endo/Other  negative endocrine ROS  Renal/GU Renal disease     Musculoskeletal   Abdominal (+) + obese,   Peds  Hematology   Anesthesia Other Findings   Reproductive/Obstetrics                            Anesthesia Physical Anesthesia Plan  ASA: IV  Anesthesia Plan: General   Post-op Pain Management:    Induction: Intravenous  Airway Management Planned: Oral ETT  Additional Equipment: Arterial line  Intra-op Plan:   Post-operative Plan: Extubation in OR and Possible Post-op intubation/ventilation  Informed Consent: I have reviewed the patients History and Physical, chart, labs and discussed the procedure including the risks, benefits and alternatives for the proposed anesthesia with the patient or authorized representative who has indicated his/her understanding and acceptance.   Dental advisory given  Plan Discussed with:   Anesthesia Plan Comments:         Anesthesia Quick Evaluation

## 2016-07-20 NOTE — Anesthesia Postprocedure Evaluation (Signed)
Anesthesia Post Note  Patient: Stanley Wells  Procedure(s) Performed: Procedure(s) (LRB): APPENDECTOMY LAPAROSCOPIC (N/A)  Patient location during evaluation: PACU Anesthesia Type: General Level of consciousness: awake and alert Pain management: pain level controlled Vital Signs Assessment: post-procedure vital signs reviewed and stable Respiratory status: spontaneous breathing, nonlabored ventilation, respiratory function stable and patient connected to nasal cannula oxygen Cardiovascular status: blood pressure returned to baseline and stable Postop Assessment: no signs of nausea or vomiting Anesthetic complications: no    Last Vitals:  Vitals:   07/20/16 0955 07/20/16 1035  BP: (!) 161/77   Pulse: 82 84  Resp: 20   Temp: 37.5 C     Last Pain:  Vitals:   07/20/16 0955  TempSrc:   PainSc: 0-No pain                 Jamis Kryder,Riccardo TERRILL

## 2016-07-20 NOTE — Progress Notes (Signed)
Patient ID: Stanley Wells, male   DOB: 12/27/1945, 70 y.o.   MRN: [id number]   Patient Name: Stanley Wells Date of Encounter: 07/20/2016  [facility]  Hospital Problem List     Active Problems:   Acute appendicitis     Subjective   Looks remarkably well having had appendix removed this am Sitting in chair taking liquids no dyspnea   Inpatient Medications    Scheduled Meds: . Chlorhexidine Gluconate Cloth  6 each Topical Daily  . digoxin  125 mcg Oral Daily  . metoprolol succinate  50 mg Oral BH-q7a  . metoprolol succinate  75 mg Oral QPM  . mupirocin ointment  1 application Nasal BID  . piperacillin-tazobactam (ZOSYN)  IV  3.375 g Intravenous Q8H  . rosuvastatin  40 mg Oral q1800  . sodium chloride flush  3 mL Intravenous Q12H   Continuous Infusions:  PRN Meds: acetaminophen **OR** acetaminophen, HYDROmorphone (DILAUDID) injection, ondansetron **OR** ondansetron (ZOFRAN) IV, oxyCODONE   Vital Signs    Vitals:   07/20/16 0924 07/20/16 0937 07/20/16 0955 07/20/16 1035  BP: (!) 159/94 (!) 162/95 (!) 161/77   Pulse: 72 80 82 84  Resp: (!) 21 20 20    Temp:  98.9 F (37.2 C) 99.5 F (37.5 C)   TempSrc:      SpO2: 98% 97% 95%   Weight:      Height:        Intake/Output Summary (Last 24 hours) at 07/20/16 1102 Last data filed at 07/20/16 1043  Gross per 24 hour  Intake             1247 ml  Output             1115 ml  Net              132 ml   Filed Weights   07/19/16 0456 07/19/16 1155  Weight: 222 lb (100.7 kg) 204 lb 8 oz (92.8 kg)    Physical Exam    GEN: Well nourished, well developed, in no acute distress.  HEENT: Grossly normal.  Neck: Supple, no JVD, carotid bruits, or masses. Cardiac: RRR, no murmurs, rubs, or gallops. No clubbing, cyanosis, edema.  Radials/DP/PT 2+ and equal bilaterally.  Respiratory:  Respirations regular and unlabored, clear to auscultation bilaterally. GI: mild distension post lap appy  MS: no  deformity or atrophy. Skin: warm and dry, no rash. Neuro:  Strength and sensation are intact. Psych: AAOx3.  Normal affect.  Labs    CBC  Recent Labs  07/19/16 0500 07/20/16 0348  WBC 19.9* 22.7*  NEUTROABS 16.3*  --   HGB 14.7 14.1  HCT 44.6 42.6  MCV 94.3 93.0  PLT 199 195   Basic Metabolic Panel  Recent Labs  07/19/16 0500 07/20/16 0348  NA 137 136  K 4.2 3.8  CL 104 102  CO2 21* 24  GLUCOSE 157* 142*  BUN 20 16  CREATININE 1.13 1.18  CALCIUM 9.6 9.2   Liver Function Tests  Recent Labs  07/19/16 0500  AST 32  ALT 36  ALKPHOS 66  BILITOT 1.1  PROT 7.1  ALBUMIN 4.1    Recent Labs  07/19/16 0500  LIPASE 16     Telemetry    NSR 07/20/2016  - Personally Reviewed  ECG    Sinus arrhythmia no acute ST changes  - Personally Reviewed  Radiology    Ct Abdomen Pelvis W Contrast  Result Date: 07/19/2016 CLINICAL DATA:  Mid abdominal  pain. EXAM: CT ABDOMEN AND PELVIS WITH CONTRAST TECHNIQUE: Multidetector CT imaging of the abdomen and pelvis was performed using the standard protocol following bolus administration of intravenous contrast. CONTRAST:  100mL ISOVUE-300 IOPAMIDOL (ISOVUE-300) INJECTION 61% COMPARISON:  08/15/2015 FINDINGS: Lower chest: Heart is borderline in size. Diffuse calcifications in the visualized right coronary artery. Lung bases clear. No effusions. Hepatobiliary: Stable cyst in the right hepatic lobe. Gallbladder unremarkable. Pancreas: No focal abnormality or ductal dilatation. Spleen: No focal abnormality.  Normal size. Adrenals/Urinary Tract: Nodules seen within the left adrenal gland, stable. Right adrenal gland unremarkable. No focal renal abnormality or hydronephrosis. Urinary bladder unremarkable. Stomach/Bowel: Scattered descending colonic diverticulosis. No active diverticulitis. The appendix is mildly dilated, measuring up to 13 mm. Appendicolith noted within the proximal appendix. Findings compatible with acute appendicitis.  Slight surrounding inflammatory stranding. Vascular/Lymphatic: Aortic and iliac calcifications. No aneurysm. No adenopathy. Reproductive: Mild prostate enlargement. Central prostate calcifications. Other: No free fluid or free air. Musculoskeletal: No acute bony abnormality or focal bone lesion. IMPRESSION: Changes of acute appendicitis. Appendicolith noted within the proximal appendix. Aortoiliac atherosclerosis. Electronically Signed   By: Charlett NoseKevin  Dover M.D.   On: 07/19/2016 08:36   Dg Chest Portable 1 View  Result Date: 07/19/2016 CLINICAL DATA:  Preoperative evaluation prior to appendectomy. EXAM: PORTABLE CHEST 1 VIEW COMPARISON:  Chest CTA dated 08/15/2015 and chest radiographs dated 07/13/2015. FINDINGS: Stable enlarged cardiac silhouette and post CABG changes. Clear lungs with normal vascularity. Mild left shoulder degenerative changes. IMPRESSION: No acute abnormality.  Stable cardiomegaly. Electronically Signed   By: Beckie SaltsSteven  Reid M.D.   On: 07/19/2016 09:46    Cardiac Studies   Echo 12/17/15 EF 40-45%   Patient Profile     Stanley Wells is a 70 y.o. male with a history of CAD s/p CABG (2003), ischemic cardiomyopathy/chronic systolic CHF, severe functional mitral regurgitation, and persistent atrial fibrillation [facility] and [facility] pain. Found to have acute appendicitis surgery done 12/17  Assessment & Plan    CAD/CABG: distant no chest pain post of stable continue beta blocker resume ASA when ok with surgery  PAF:  ECG SR rate 66 regular pulse post op CHF:  Appears euvolemic resume entresto and nitrates PRN lasix post op   Signed, Charlton HawsPeter Fusako Tanabe, MD  07/20/2016, 11:02 AM

## 2016-07-20 NOTE — Progress Notes (Signed)
PT Cancellation Note:  Saw pt went to OR PT will recheck tomorrow following procedure.   Thank you,  Colin BroachSabra M. Samary Shatz PT, DPT  [phone]

## 2016-07-20 NOTE — Progress Notes (Signed)
Patient cleared by cardiology Vitals:   07/19/16 2106 07/20/16 0524  BP: (!) 143/71 (!) 154/71  Pulse: 81 80  Resp: 18 18  Temp: 98.9 F (37.2 C) 98.6 F (37 C)    CBC    Component Value Date/Time   WBC 22.7 (H) 07/20/2016 0348   RBC 4.58 07/20/2016 0348   HGB 14.1 07/20/2016 0348   HCT 42.6 07/20/2016 0348   PLT 195 07/20/2016 0348   MCV 93.0 07/20/2016 0348   MCH 30.8 07/20/2016 0348   MCHC 33.1 07/20/2016 0348   RDW 14.3 07/20/2016 0348   LYMPHSABS 2.5 07/19/2016 0500   MONOABS 1.0 07/19/2016 0500   EOSABS 0.1 07/19/2016 0500   BASOSABS 0.0 07/19/2016 0500   Still with some RLQ tenderness.  Will proceed with lap appendectomy today.  The surgical procedure has been discussed with the patient.  Potential risks, benefits, alternative treatments, and expected outcomes have been explained.  All of the patient's questions at this time have been answered.  The likelihood of reaching the patient's treatment goal is good.  The patient understand the proposed surgical procedure and wishes to proceed.  Stanley ArmsMatthew K. Corliss Skainssuei, MD, [facility]  General/ Trauma Surgery  07/20/2016 7:54 AM

## 2016-07-20 NOTE — Transfer of Care (Signed)
Immediate Anesthesia Transfer of Care Note  Patient: Stanley Wells  Procedure(s) Performed: Procedure(s): APPENDECTOMY LAPAROSCOPIC (N/A)  Patient Location: PACU  Anesthesia Type:General  Level of Consciousness: awake, oriented and patient cooperative  Airway & Oxygen Therapy: Patient Spontanous Breathing and Patient connected to nasal cannula oxygen  Post-op Assessment: Report given to RN, Post -op Vital signs reviewed and stable and Patient moving all extremities  Post vital signs: Reviewed and stable  Last Vitals:  Vitals:   07/19/16 2106 07/20/16 0524  BP: (!) 143/71 (!) 154/71  Pulse: 81 80  Resp: 18 18  Temp: 37.2 C 37 C    Last Pain:  Vitals:   07/20/16 0600  TempSrc:   PainSc: 10-Worst pain ever      Patients Stated Pain Goal: 0 (07/20/16 0600)  Complications: No apparent anesthesia complications

## 2016-07-21 ENCOUNTER — Encounter (HOSPITAL_COMMUNITY): Payer: Self-pay | Admitting: Surgery

## 2016-07-21 DIAGNOSIS — K9189 Other postprocedural complications and disorders of digestive system: Secondary | ICD-10-CM

## 2016-07-21 DIAGNOSIS — D72829 Elevated white blood cell count, unspecified: Secondary | ICD-10-CM

## 2016-07-21 DIAGNOSIS — K567 Ileus, unspecified: Secondary | ICD-10-CM

## 2016-07-21 DIAGNOSIS — I5042 Chronic combined systolic (congestive) and diastolic (congestive) heart failure: Secondary | ICD-10-CM

## 2016-07-21 LAB — CBC
HCT: 38 % — ABNORMAL LOW (ref 39.0–52.0)
Hemoglobin: 12.4 g/dL — ABNORMAL LOW (ref 13.0–17.0)
MCH: 30.2 pg (ref 26.0–34.0)
MCHC: 32.6 g/dL (ref 30.0–36.0)
MCV: 92.7 fL (ref 78.0–100.0)
PLATELETS: 187 10*3/uL (ref 150–400)
RBC: 4.1 MIL/uL — ABNORMAL LOW (ref 4.22–5.81)
RDW: 14.6 % (ref 11.5–15.5)
WBC: 21.2 10*3/uL — AB (ref 4.0–10.5)

## 2016-07-21 LAB — BASIC METABOLIC PANEL
Anion gap: 12 (ref 5–15)
BUN: 36 mg/dL — AB (ref 6–20)
CHLORIDE: 99 mmol/L — AB (ref 101–111)
CO2: 23 mmol/L (ref 22–32)
CREATININE: 1.87 mg/dL — AB (ref 0.61–1.24)
Calcium: 9 mg/dL (ref 8.9–10.3)
GFR calc Af Amer: 41 mL/min — ABNORMAL LOW (ref >60)
GFR, EST NON AFRICAN AMERICAN: 35 mL/min — AB (ref >60)
GLUCOSE: 157 mg/dL — AB (ref 65–99)
Potassium: 3.9 mmol/L (ref 3.5–5.1)
SODIUM: 134 mmol/L — AB (ref 135–145)

## 2016-07-21 NOTE — Progress Notes (Signed)
[facility] Progress Note  1 Day Post-Op  Subjective: Abdominal pain currently controlled - mild soreness. Reports some increased pain last night associated with one episode of "cold sweats". Denies flatus or BM. Feels "full" and "like I cant eat anything else". Ambulating in halls. Pulling 1200+ on IS  Wife is at bedside today during my exam  Objective: Vital signs in last 24 hours: Temp:  [98 F (36.7 C)-100.2 F (37.9 C)] 99.5 F (37.5 C) (12/18 0520) Pulse Rate:  [72-100] 75 (12/18 0520) Resp:  [18-22] 18 (12/18 0520) BP: (98-192)/(63-97) 129/64 (12/18 0520) SpO2:  [95 %-100 %] 96 % (12/18 0520) Weight:  [94.1 kg (207 lb 7.3 oz)-101.5 kg (223 lb 12.8 oz)] 101.5 kg (223 lb 12.8 oz) (12/18 0208) Last BM Date: 07/18/16  Intake/Output from previous day: 12/17 0701 - 12/18 0700 In: 1375 [P.O.:462; I.V.:713; IV Piggyback:200] Out: 365 [Urine:350; Blood:15] Intake/Output this shift: Total I/O In: -  Out: 600 [Urine:600]  PE: Gen:  Alert, NAD, pleasant and cooperative Pulm:  Unlabored, CTA, no W/R/R Abd: firm, mild tenderness - worse over RLQ with guarding, moderately distended and tympanic,+BS, incisions C/D/I  Lab Results:   Recent Labs  07/20/16 0348 07/21/16 0525  WBC 22.7* 21.2*  HGB 14.1 12.4*  HCT 42.6 38.0*  PLT 195 187   BMET  Recent Labs  07/20/16 0348 07/21/16 0525  NA 136 134*  K 3.8 3.9  CL 102 99*  CO2 24 23  GLUCOSE 142* 157*  BUN 16 36*  CREATININE 1.18 1.87*  CALCIUM 9.2 9.0   PT/INR No results for input(s): LABPROT, INR in the last 72 hours. CMP     Component Value Date/Time   NA 134 (L) 07/21/2016 0525   K 3.9 07/21/2016 0525   CL 99 (L) 07/21/2016 0525   CO2 23 07/21/2016 0525   GLUCOSE 157 (H) 07/21/2016 0525   BUN 36 (H) 07/21/2016 0525   CREATININE 1.87 (H) 07/21/2016 0525   CREATININE 0.90 05/01/2015 0942   CALCIUM 9.0 07/21/2016 0525   PROT 7.1 07/19/2016 0500   ALBUMIN 4.1 07/19/2016 0500   AST 32  07/19/2016 0500   ALT 36 07/19/2016 0500   ALKPHOS 66 07/19/2016 0500   BILITOT 1.1 07/19/2016 0500   GFRNONAA 35 (L) 07/21/2016 0525   GFRAA 41 (L) 07/21/2016 0525   Lipase     Component Value Date/Time   LIPASE 16 07/19/2016 0500   Studies/Results: Dg Chest Portable 1 View  Result Date: 07/19/2016 CLINICAL DATA:  Preoperative evaluation prior to appendectomy. EXAM: PORTABLE CHEST 1 VIEW COMPARISON:  Chest CTA dated 08/15/2015 and chest radiographs dated 07/13/2015. FINDINGS: Stable enlarged cardiac silhouette and post CABG changes. Clear lungs with normal vascularity. Mild left shoulder degenerative changes. IMPRESSION: No acute abnormality.  Stable cardiomegaly. Electronically Signed   By: Beckie SaltsSteven  Reid M.D.   On: 07/19/2016 09:46    Anti-infectives: Anti-infectives    Start     Dose/Rate Route Frequency Ordered Stop   07/19/16 1200  piperacillin-tazobactam (ZOSYN) IVPB 3.375 g     3.375 g 12.5 mL/hr over 240 Minutes Intravenous Every 8 hours 07/19/16 1126     07/19/16 1100  cefTRIAXone (ROCEPHIN) 2 g in dextrose 5 % 50 mL IVPB  Status:  Discontinued     2 g 100 mL/hr over 30 Minutes Intravenous  Once 07/19/16 1059 07/19/16 1101   07/19/16 1100  Ampicillin-Sulbactam (UNASYN) 3 g in sodium chloride 0.9 % 100 mL IVPB  Status:  Discontinued  3 g 200 mL/hr over 30 Minutes Intravenous Every 6 hours 07/19/16 1059 07/19/16 1101     Assessment/Plan Perforated appendicitis with small appendiceal abscess S/P laparoscopic appendectomy 07/20/16 Dr. Manus RuddMatthew Tsuei  POD#1, afebrile, VSS  WBC 21.2 from 22.7 - CBC in AM  Post-op ileus -distended, back off diet to clear liquids and await return of bowel function  Continue IV abx  Will require 7-10 days of PO Augmentin at discharge  Continue to hold Elquis an additional 24 h  FEN: Regular ID: Zosyn 12/16 >>  VTE:   Plan: post-operative ileus - clear liquids and await return of bowel function continue IV abx and repeat CBC in  AM Ambulate/IS  Hold Eliquis for 24 h and re-assess in the AM. If leukocytosis resolving, afebrile tomorrow, and bowel function improving will likely re-start Eliquis and prepare for discharge on PO abx. If signs and symptoms worsen and there is concern for post-operative infection, patient may require a heparin drip and further observation/work-up.      LOS: 2 days    Adam PhenixElizabeth S Lajada Janes , [facility] 07/21/2016, 8:54 AM Pager: [phone] Consults: [phone] Mon-Fri 7:00 am-4:30 pm Sat-Sun 7:00 am-11:30 am

## 2016-07-21 NOTE — Evaluation (Signed)
Occupational Therapy Evaluation Patient Details Name: Stanley FreibergJames C [name]: [id number] DOB: 07/31/1946 Today's Date: 07/21/2016    History of Present Illness 70 yo male admitted via ED on 07/19/16 with RLQ abdominal pain and was diagnosed with acute appendicitis. Pt underwent appendectomy via laparoscopy on 07/20/16. PMH significant for HTN, HLD, ischemia cardiomyopathy, chronic diastolic and systolic CHF, Atrial Fib, CABG x5 2003.    Clinical Impression   PTA, pt was independent with ADL and functional mobility. Pt currently requires supervision for standing ADL and functional mobility and demonstrates decreased activity tolerance for ADL. Pt would benefit from continued OT services while admitted to improve activity tolerance for ADL in order to ensure safe return home. Educated pt on energy conservation strategies and provided handout. OT will continue to follow acutely with focus on ADL transfers and activity tolerance/safety with standing ADL.    Follow Up Recommendations  No OT follow up    Equipment Recommendations  3 in 1 bedside commode    Recommendations for Other Services       Precautions / Restrictions Precautions Precautions: None Restrictions Weight Bearing Restrictions: No      Mobility Bed Mobility               General bed mobility comments: Sitting at EOB on OT arrival.  Transfers Overall transfer level: Needs assistance Equipment used: None Transfers: Sit to/from Stand Sit to Stand: Supervision         General transfer comment: supervision for safety from EOB and recliner    Balance Overall balance assessment: Needs assistance Sitting-balance support: No upper extremity supported;Feet supported Sitting balance-Leahy Scale: Good     Standing balance support: No upper extremity supported;During functional activity Standing balance-Leahy Scale: Good Standing balance comment: Pt reaching for grab bars when available. Fatigueing easily.                             ADL Overall ADL's : Needs assistance/impaired     Grooming: Supervision/safety;Standing   Upper Body Bathing: Set up;Sitting   Lower Body Bathing: Supervison/ safety;Sit to/from stand   Upper Body Dressing : Set up;Sitting   Lower Body Dressing: Supervision/safety;Sit to/from stand   Toilet Transfer: Supervision/safety;Ambulation;Grab bars   Toileting- Clothing Manipulation and Hygiene: Supervision/safety;Sit to/from stand   Tub/ Shower Transfer: Supervision/safety;Walk-in shower;Ambulation   Functional mobility during ADLs: Supervision/safety General ADL Comments: Pt refusing gait belt this session. Able to complete ADL with supervision. Educated on energy conservation and provided handout. Pt unsure that he will need shower seat, but willing to have therapist recommend this.     Vision Vision Assessment?: No apparent visual deficits   Perception     Praxis      Pertinent Vitals/Pain Pain Assessment: Faces Faces Pain Scale: Hurts little more Pain Location: RLQ; with activity Pain Descriptors / Indicators: Sore Pain Intervention(s): Limited activity within patient's tolerance;Monitored during session     Hand Dominance Right   Extremity/Trunk Assessment Upper Extremity Assessment Upper Extremity Assessment: Overall WFL for tasks assessed   Lower Extremity Assessment Lower Extremity Assessment: Overall WFL for tasks assessed   Cervical / Trunk Assessment Cervical / Trunk Assessment: Normal   Communication Communication Communication: No difficulties   Cognition Arousal/Alertness: Awake/alert Behavior During Therapy: WFL for tasks assessed/performed Overall Cognitive Status: Within Functional Limits for tasks assessed                     General Comments  Exercises       Shoulder Instructions      Home Living Family/patient expects to be discharged to:: Private residence Living Arrangements:  Spouse/significant other Available Help at Discharge: Family;Available 24 hours/day Type of Home: House Home Access: Stairs to enter Entergy CorporationEntrance Stairs-Number of Steps: 2 Entrance Stairs-Rails: Right;Left;Can reach both Home Layout: One level     Bathroom Shower/Tub: Producer, television/film/videoWalk-in shower   Bathroom Toilet: Standard     Home Equipment: None          Prior Functioning/Environment Level of Independence: Independent        Comments: working in yard and driving prior to admission        OT Problem List: Decreased activity tolerance;Impaired balance (sitting and/or standing);Decreased safety awareness;Pain   OT Treatment/Interventions: Self-care/ADL training;Therapeutic exercise;Therapeutic activities;Patient/family education;Energy conservation    OT Goals(Current goals can be found in the care plan section) Acute Rehab OT Goals Patient Stated Goal: to get home OT Goal Formulation: With patient Time For Goal Achievement: 08/04/16 Potential to Achieve Goals: Good ADL Goals Pt Will Perform Tub/Shower Transfer: Shower transfer;3 in 1;with modified independence Additional ADL Goal #1: Pt will improve activity tolerance to 15 minutes for standing ADL tasks at sink as a precursor to improved ADL independence.  OT Frequency: Min 2X/week   Barriers to D/C:            Co-evaluation              End of Session    Activity Tolerance: Patient tolerated treatment well Patient left: in chair;with call bell/phone within reach   Time: [id number] OT Time Calculation (min): 17 min Charges:  OT General Charges $OT Visit: 1 Procedure OT Evaluation $OT Eval Moderate Complexity: 1 Procedure  Doristine SectionCharity A Theresa Wedel, OTR/L [phone] 07/21/2016, 9:43 AM

## 2016-07-21 NOTE — Care Management Note (Signed)
Case Management Note  Patient Details  Name: Stanley [name]: [id number] Date of Birth: 12/12/1945  Subjective/Objective:                    Action/Plan:   Expected Discharge Date:                  Expected Discharge Plan:  Home/Self Care  In-House Referral:     Discharge planning Services     Post Acute Care Choice:    Choice offered to:     DME Arranged:  3-N-1 DME Agency:  [facility]  [facility]:     Status of Service:  In process, will continue to follow  If discussed at Long Length of Stay Meetings, dates discussed:    Additional Comments:  [name], [name], RN 07/21/2016, 10:50 AM

## 2016-07-21 NOTE — Progress Notes (Signed)
Subjective:  Patient sitting up in bed this morning. Reporting abdominal fullness and soreness. Abdominal pain is controlled but is sore from surgery. He reports he was able to tolerate clear liquid diet yesterday but feels full and bloated. He has not passed any flatus or had any bowel movements since surgery. Able to ambulate in the hall with no issues.   Objective:  Vital signs in last 24 hours: Vitals:   07/20/16 1655 07/20/16 2017 07/21/16 0208 07/21/16 0520  BP: 106/66 98/63  129/64  Pulse: 100 88  75  Resp: 18 18  18   Temp: 99 F (37.2 C) 100.2 F (37.9 C)  99.5 F (37.5 C)  TempSrc: Oral Oral  Oral  SpO2: 95% 98%  96%  Weight:   223 lb 12.8 oz (101.5 kg)   Height:       Physical Exam Constitutional: NAD, appears comfortable, sitting up in bed Cardiovascular: Irregularly irregular rhythm, normal rate, no murmurs, rubs, or gallops.  Pulmonary/Chest: CTAB, no wheezes, rales, or rhonchi.   Abdominal: Post surgical bandages in place, c/d/i. Abdomen is distended and firm. Only mildly tender to palpation in RLQ. No guarding or rigidity. +BS.  Extremities: Warm and well perfused. Distal pulses intact. No edema.  Psychiatric: Normal mood and affect  Assessment/Plan:  Acute Appendicitis:  S/p laparoscopic appendectomy 12/17 by Dr. Corliss Skainssuei. Patient has remained afebrile (Tmax 100.2) but leukocytosis has persisted today, 19.9 > 22.7 > 21.2. Per op note, the appendix was found to show signs of perforation with necrosis and a small amount of surrounding purulent fluid.  He has not yet passed any flatus or moved his bowels. He reports minimal complaints but does have some RLQ tenderness. Surgery recommending clear liquids until bowel function returns. Likely post-op ileus. Will continue IV antibiotics and monitor CBC in am. If improving will plan on discharge tomorrow to complete course of PO antibiotics.  -- Surgery following, appreciate recommendations  -- Cardiology consult;  appreciate recommendations  -- Dilaudid 1 mg q2 hours prn for pain  -- Oxycodone 5-10 mg q4 hours prn for pain  -- Zofran 4 mg q6 hours prn for nausea -- Continue IV Zosyn q8 hours  -- Clear liquid diet  Atrial Fibrillation/Atrial Flutter:  Persistent a-fib with hx of atrial flutter in 2015 requiring DC cardioversion. Rate controlled on metoprolol, amiodarone, and digoxin. He is also on Eliquis as an outpatient. Holding Eliquis in setting of surgery. Will continue to hold Eliquis today with possible resumption tomorrow if improving per surgery recommendations. This patients CHA2DS2-VASc Score and unadjusted Ischemic Stroke Rate (% per year) is equal to 3.2 % stroke rate/year from a score of 3 Above score calculated as 1 point each if present [CHF, HTN, DM, Vascular=MI/PAD/Aortic Plaque, Age if 65-74, or Male], 2 points each if present [Age > 75, or Stroke/TIA/TE]  -- Continue digoxin 125 mcg daily  -- Continue metoprolol 50 mg qAM and 75 mg QHS -- Holding Eliquis as above  Chronic Combined Systolic and Diastolic Heart Failure: Secondary to ischemic cardiomyopathy. Follows with Dr. Shirlee LatchMcLean. Currently well compensated and medically optimized.  -- Continue metoprolol and digoxin -- Continue home imdur, spironolactone, and entresto   HTN BP soft last night after resuming home BP medications. Stable today in the 120s systolic. -Continue home medications as above -Continue to monitor  HLD: -- Continue crestor 40 mg daily   FEN:  replete lytes prn, clear liquids VTE ppx: SCDs, holding Eliquis  Code Status: FULL  Dispo: Anticipated discharge tomorrow  pending advancement in diet and return of bowl function.   Valentino NoseNathan Aeralyn Barna, MD 07/21/2016, 10:40 AM Pager: [phone]

## 2016-07-21 NOTE — Progress Notes (Signed)
Internal Medicine Attending  Date: 07/21/2016  Patient name: Stanley Wells Medical record number: [id number] Date of birth: 05/26/1946 Age: 70 y.o. Gender: male  I saw and evaluated the patient. I reviewed the resident's note by Dr. Karma GreaserBoswell and I agree with the resident's findings and plans as documented in his progress note.  When Stanley Wells was seen on rounds this morning he noted some abdominal tenderness. He had been eating but has not had a bowel movement or passed any gas. Examination revealed abdominal distention. He continues to have a leukocytosis on the IV antibiotics. We appreciate surgery's input and will continue the IV antibiotics, de-escalate the diet to clear liquid for the time being, continue to hold the Eliquis, and reassess return of bowel function in the morning. In the meantime, we are continuing his current cardiac regimen which he is well compensated on.

## 2016-07-21 NOTE — Progress Notes (Signed)
Patient ID: Stanley FreibergJames C Mcelwain, male   DOB: 03/24/1946, 70 y.o.   MRN: [id number]   Patient Name: Stanley Wells Date of Encounter: 07/21/2016  [facility]  Hospital Problem List     Active Problems:   Acute appendicitis   Acute appendicitis with localized peritonitis   Chronic combined systolic and diastolic congestive heart failure (HCC)     Subjective   Looks remarkably well having had appendix removed this am Sitting in chair taking liquids no dyspnea   Inpatient Medications    Scheduled Meds: . Chlorhexidine Gluconate Cloth  6 each Topical Daily  . digoxin  125 mcg Oral Daily  . isosorbide mononitrate  30 mg Oral Daily  . metoprolol succinate  50 mg Oral BH-q7a  . metoprolol succinate  75 mg Oral QPM  . mupirocin ointment  1 application Nasal BID  . piperacillin-tazobactam (ZOSYN)  IV  3.375 g Intravenous Q8H  . rosuvastatin  40 mg Oral q1800  . sacubitril-valsartan  1 tablet Oral BID  . sodium chloride flush  3 mL Intravenous Q12H  . spironolactone  12.5 mg Oral Daily   Continuous Infusions:  PRN Meds: acetaminophen **OR** acetaminophen, HYDROmorphone (DILAUDID) injection, ondansetron **OR** ondansetron (ZOFRAN) IV, oxyCODONE   Vital Signs    Vitals:   07/20/16 1655 07/20/16 2017 07/21/16 0208 07/21/16 0520  BP: 106/66 98/63  129/64  Pulse: 100 88  75  Resp: 18 18  18   Temp: 99 F (37.2 C) 100.2 F (37.9 C)  99.5 F (37.5 C)  TempSrc: Oral Oral  Oral  SpO2: 95% 98%  96%  Weight:   223 lb 12.8 oz (101.5 kg)   Height:        Intake/Output Summary (Last 24 hours) at 07/21/16 0844 Last data filed at 07/21/16 0750  Gross per 24 hour  Intake             1375 ml  Output              965 ml  Net              410 ml   Filed Weights   07/19/16 1155 07/20/16 0955 07/21/16 0208  Weight: 204 lb 8 oz (92.8 kg) 207 lb 7.3 oz (94.1 kg) 223 lb 12.8 oz (101.5 kg)    Physical Exam    GEN: Well nourished, well developed, in no acute distress.   HEENT: Grossly normal.  Neck: Supple, no JVD, carotid bruits, or masses. Cardiac: RRR, no murmurs, rubs, or gallops. No clubbing, cyanosis, edema.  Radials/DP/PT 2+ and equal bilaterally.  Respiratory:  Respirations regular and unlabored, clear to auscultation bilaterally. GI: mild distension post lap appy  MS: no deformity or atrophy. Skin: warm and dry, no rash. Neuro:  Strength and sensation are intact. Psych: AAOx3.  Normal affect.  Labs    CBC  Recent Labs  07/19/16 0500 07/20/16 0348 07/21/16 0525  WBC 19.9* 22.7* 21.2*  NEUTROABS 16.3*  --   --   HGB 14.7 14.1 12.4*  HCT 44.6 42.6 38.0*  MCV 94.3 93.0 92.7  PLT 199 195 187   Basic Metabolic Panel  Recent Labs  07/20/16 0348 07/21/16 0525  NA 136 134*  K 3.8 3.9  CL 102 99*  CO2 24 23  GLUCOSE 142* 157*  BUN 16 36*  CREATININE 1.18 1.87*  CALCIUM 9.2 9.0   Liver Function Tests  Recent Labs  07/19/16 0500  AST 32  ALT 36  ALKPHOS 66  BILITOT 1.1  PROT 7.1  ALBUMIN 4.1    Recent Labs  07/19/16 0500  LIPASE 16     Telemetry    NSR 07/21/2016  - Personally Reviewed  ECG    Sinus arrhythmia no acute ST changes  - Personally Reviewed  Radiology    Dg Chest Portable 1 View  Result Date: 07/19/2016 CLINICAL DATA:  Preoperative evaluation prior to appendectomy. EXAM: PORTABLE CHEST 1 VIEW COMPARISON:  Chest CTA dated 08/15/2015 and chest radiographs dated 07/13/2015. FINDINGS: Stable enlarged cardiac silhouette and post CABG changes. Clear lungs with normal vascularity. Mild left shoulder degenerative changes. IMPRESSION: No acute abnormality.  Stable cardiomegaly. Electronically Signed   By: Beckie SaltsSteven  Reid M.D.   On: 07/19/2016 09:46    Cardiac Studies   Echo 12/17/15 EF 40-45%   Patient Profile     Stanley FreibergJames C Beasley is a 70 y.o. male with a history of CAD s/p CABG (2003), ischemic cardiomyopathy/chronic systolic CHF, severe functional mitral regurgitation, and persistent atrial  fibrillation who [facility] with nausea and abdominal pain. Found to have acute appendicitis surgery done 12/17  Assessment & Plan    CAD/CABG: distant no chest pain post of stable continue beta blocker resume ASA when ok with surgery  PAF:  ECG SR rate 66 telemetry with isolated PVCls stable  CHF:  Appears euvolemic no dyspnea have him back on entresto and nitrates no lasix today given elevated BUN/Cr Appendicitis:  More abdominal pain today WBC still very high on zosyn plan per general surgery   Signed, Charlton HawsPeter Suda Forbess, MD  07/21/2016, 8:44 AM  Patient ID: Stanley FreibergJames C Fludd, male   DOB: 01/31/1946, 70 y.o.   MRN: [id number]

## 2016-07-22 ENCOUNTER — Inpatient Hospital Stay (HOSPITAL_COMMUNITY): Payer: Medicare Other

## 2016-07-22 DIAGNOSIS — K567 Ileus, unspecified: Secondary | ICD-10-CM

## 2016-07-22 DIAGNOSIS — Z978 Presence of other specified devices: Secondary | ICD-10-CM

## 2016-07-22 LAB — BASIC METABOLIC PANEL
ANION GAP: 10 (ref 5–15)
Anion gap: 11 (ref 5–15)
BUN: 35 mg/dL — ABNORMAL HIGH (ref 6–20)
BUN: 36 mg/dL — ABNORMAL HIGH (ref 6–20)
CHLORIDE: 97 mmol/L — AB (ref 101–111)
CHLORIDE: 97 mmol/L — AB (ref 101–111)
CO2: 26 mmol/L (ref 22–32)
CO2: 25 mmol/L (ref 22–32)
Calcium: 9.1 mg/dL (ref 8.9–10.3)
Calcium: 9.1 mg/dL (ref 8.9–10.3)
Creatinine, Ser: 1.56 mg/dL — ABNORMAL HIGH (ref 0.61–1.24)
Creatinine, Ser: 1.61 mg/dL — ABNORMAL HIGH (ref 0.61–1.24)
GFR calc Af Amer: 51 mL/min — ABNORMAL LOW (ref >60)
GFR calc non Af Amer: 42 mL/min — ABNORMAL LOW (ref >60)
GFR, EST AFRICAN AMERICAN: 49 mL/min — AB (ref >60)
GFR, EST NON AFRICAN AMERICAN: 44 mL/min — AB (ref >60)
GLUCOSE: 175 mg/dL — AB (ref 65–99)
Glucose, Bld: 171 mg/dL — ABNORMAL HIGH (ref 65–99)
POTASSIUM: 3.9 mmol/L (ref 3.5–5.1)
POTASSIUM: 3.9 mmol/L (ref 3.5–5.1)
SODIUM: 133 mmol/L — AB (ref 135–145)
Sodium: 133 mmol/L — ABNORMAL LOW (ref 135–145)

## 2016-07-22 LAB — CBC
HEMATOCRIT: 41.6 % (ref 39.0–52.0)
HEMOGLOBIN: 13.7 g/dL (ref 13.0–17.0)
MCH: 30.4 pg (ref 26.0–34.0)
MCHC: 32.9 g/dL (ref 30.0–36.0)
MCV: 92.2 fL (ref 78.0–100.0)
Platelets: 239 10*3/uL (ref 150–400)
RBC: 4.51 MIL/uL (ref 4.22–5.81)
RDW: 14.3 % (ref 11.5–15.5)
WBC: 19.4 10*3/uL — AB (ref 4.0–10.5)

## 2016-07-22 MED ORDER — SODIUM CHLORIDE 0.45 % IV SOLN
INTRAVENOUS | Status: DC
  Administered 2016-07-22 – 2016-07-24 (×3): via INTRAVENOUS

## 2016-07-22 MED ORDER — BISACODYL 10 MG RE SUPP
10.0000 mg | Freq: Once | RECTAL | Status: AC
  Administered 2016-07-22: 10 mg via RECTAL
  Filled 2016-07-22: qty 1

## 2016-07-22 MED ORDER — FUROSEMIDE 20 MG PO TABS
20.0000 mg | ORAL_TABLET | Freq: Every day | ORAL | Status: DC
  Administered 2016-07-22: 20 mg via ORAL
  Filled 2016-07-22: qty 1

## 2016-07-22 NOTE — Progress Notes (Signed)
PT Cancellation Note  Patient Details Name: Stanley [name]: [id number] DOB: 11/13/1945   Cancelled Treatment:    Reason Eval/Treat Not Completed: Patient declined, no reason specified Patient declined mobility despite encouragement and education on benefits of OOB mobility. PT will continue to follow acutely.    Derek MoundKellyn R Anissa Abbs Pavan Bring, PTA Pager: [phone]   07/22/2016, 11:56 AM

## 2016-07-22 NOTE — Progress Notes (Signed)
Internal Medicine Attending  Date: 07/22/2016  Patient name: Stanley Wells Medical record number: [id number] Date of birth: 02/15/1946 Age: 70 y.o. Gender: male  I saw and evaluated the patient. I reviewed the resident's note by Dr. Antony ContrasGuilloud and I agree with the resident's findings and plans as documented in her progress note.  When seen on rounds this morning [name] was uncomfortable. His abdomen was distended and he was nauseous. He just had an NG tube placed in hopes of decompressing his abdomen. We are continuing current supportive care and management of his chronic medical problems while awaiting return of his bowel function postoperatively. We are also continuing the IV antibiotics and holding the Eliquis in case reoperation should be necessary.

## 2016-07-22 NOTE — Progress Notes (Signed)
   Subjective: Patient continues to endorse abdominal discomfort, worse than yesterday. He still has not passed flatus or had a BM. Endorses nausea but no emesis.   Objective:  Vital signs in last 24 hours: Vitals:   07/21/16 1300 07/21/16 2155 07/22/16 0457 07/22/16 0541  BP: (!) 142/77 (!) 143/77  134/83  Pulse: 96 80  66  Resp: 18 10  16   Temp: 98 F (36.7 C) 98.2 F (36.8 C)  98.1 F (36.7 C)  TempSrc: Oral Oral  Oral  SpO2: 93% 95%  96%  Weight:   219 lb 11.2 oz (99.7 kg)   Height:       Physical Exam Constitutional: NAD, appears comfortable, sitting up in bed Cardiovascular: Irregularly irregular rhythm, normal rate, no murmurs, rubs, or gallops.  Pulmonary/Chest: CTAB, no wheezes, rales, or rhonchi.   Abdominal: Post surgical bandages in place, c/d/i. Abdomen is distended and tympanic. Tender to palpation RLQ. Hyperactive BS. Extremities: Warm and well perfused. Distal pulses intact. No edema.  Psychiatric: Normal mood and affect   Assessment/Plan:  Acute Appendicitis: S/p laparoscopic appendectomy 12/17 by Dr. Corliss Skainssuei, complicated now by post op ileus. Patient has remained afebrile. Leukocytosis improved today, 21 -> 19.4. Per op note, the appendix was found to show signs of perforation with necrosis and a small amount of surrounding purulent fluid. Continuing IV Zosyn. He has not yet passed any flatus or moved his bowels. Endorses abdominal pain, distention, and nausea.  -- Surgery following, appreciate recommendations  -- Cardiology consult; appreciate recommendations  -- NGT with intermittent suction -- S/p dulcolax suppository 10 mg once today  -- Clear liquid diet -- Pain regimen per surgery  -- Continue IV Zosyn q8 hours   Atrial Fibrillation/Atrial Flutter: Persistent a-fib with hx of atrial flutter in 2015 requiring DC cardioversion. Rate controlled on metoprolol, amiodarone, and digoxin. Holding Eliquis in setting of recent surgery. This patients CHA2DS2-VASc  Score and unadjusted Ischemic Stroke Rate (% per year) is equal to 3.2 %. No indication for heparin bridge at this time. Will continue to hold Eliquis.  -- Continue digoxin 125 mcg daily  -- Continue metoprolol 50 mg qAM and 75 mg QHS -- Holding Eliquis as above  Chronic Combined Systolic and Diastolic Heart Failure:Secondary to ischemic cardiomyopathy. Follows with Dr. Shirlee LatchMcLean. Currently well compensated and medically optimized.  -- Continue metoprolol and digoxin -- Holding home imdur, spironolactone, and entresto in the setting of AKI   HTN: BP soft last night after resuming home BP medications. Stable today in the 120s systolic. -- Continue to monitor  HLD: -- Continue crestor 40 mg daily   FEN:  replete lytes prn, clear liquids VTE ppx: SCDs, holding Eliquis  Code Status: FULL  Dispo: Anticipated discharge in approximately 1-2 day(s) pending return of bowel function.    Stanley Pollarolyn Marshawn Normoyle, MD 07/22/2016, 10:15 AM Pager: [phone]

## 2016-07-22 NOTE — Progress Notes (Signed)
[facility] Progress Note  2 Days Post-Op  Subjective: Frustrated and uncomfortable. Abdominal discomfort and pain increased compared to yesterday. Severe nausea this morning - denies vomiting but has been "spitting up white stuff". Denies flatus or BM. Denies SOB or trouble breathing - pulling >1000 on IS.   Objective: Vital signs in last 24 hours: Temp:  [98 F (36.7 C)-98.2 F (36.8 C)] 98.1 F (36.7 C) (12/19 0541) Pulse Rate:  [66-96] 66 (12/19 0541) Resp:  [10-18] 16 (12/19 0541) BP: (134-143)/(77-83) 134/83 (12/19 0541) SpO2:  [93 %-96 %] 96 % (12/19 0541) Weight:  [99.7 kg (219 lb 11.2 oz)] 99.7 kg (219 lb 11.2 oz) (12/19 0457) Last BM Date: 07/18/16  Intake/Output from previous day: 12/18 0701 - 12/19 0700 In: 240 [P.O.:240] Out: 600 [Urine:600] Intake/Output this shift: No intake/output data recorded.  PE: Gen:  Alert, NAD, pleasant and cooperative Pulm:  Unlabored, CTA, no W/R/R Abd: soft, distended and tympanic, mild tenderness that is worse over RLQ, +BS, incisions c/d/i  Lab Results:   Recent Labs  07/21/16 0525 07/22/16 0746  WBC 21.2* 19.4*  HGB 12.4* 13.7  HCT 38.0* 41.6  PLT 187 239   BMET  Recent Labs  07/21/16 0525 07/22/16 0746  NA 134* 133*  133*  K 3.9 3.9  3.9  CL 99* 97*  97*  CO2 23 25  26   GLUCOSE 157* 171*  175*  BUN 36* 36*  35*  CREATININE 1.87* 1.61*  1.56*  CALCIUM 9.0 9.1  9.1   PT/INR No results for input(s): LABPROT, INR in the last 72 hours. CMP     Component Value Date/Time   NA 133 (L) 07/22/2016 0746   NA 133 (L) 07/22/2016 0746   K 3.9 07/22/2016 0746   K 3.9 07/22/2016 0746   CL 97 (L) 07/22/2016 0746   CL 97 (L) 07/22/2016 0746   CO2 25 07/22/2016 0746   CO2 26 07/22/2016 0746   GLUCOSE 171 (H) 07/22/2016 0746   GLUCOSE 175 (H) 07/22/2016 0746   BUN 36 (H) 07/22/2016 0746   BUN 35 (H) 07/22/2016 0746   CREATININE 1.61 (H) 07/22/2016 0746   CREATININE 1.56 (H) 07/22/2016 0746   CREATININE 0.90 05/01/2015 0942   CALCIUM 9.1 07/22/2016 0746   CALCIUM 9.1 07/22/2016 0746   PROT 7.1 07/19/2016 0500   ALBUMIN 4.1 07/19/2016 0500   AST 32 07/19/2016 0500   ALT 36 07/19/2016 0500   ALKPHOS 66 07/19/2016 0500   BILITOT 1.1 07/19/2016 0500   GFRNONAA 42 (L) 07/22/2016 0746   GFRNONAA 44 (L) 07/22/2016 0746   GFRAA 49 (L) 07/22/2016 0746   GFRAA 51 (L) 07/22/2016 0746   Lipase     Component Value Date/Time   LIPASE 16 07/19/2016 0500       Studies/Results: No results found.  Anti-infectives: Anti-infectives    Start     Dose/Rate Route Frequency Ordered Stop   07/19/16 1200  piperacillin-tazobactam (ZOSYN) IVPB 3.375 g     3.375 g 12.5 mL/hr over 240 Minutes Intravenous Every 8 hours 07/19/16 1126     07/19/16 1100  cefTRIAXone (ROCEPHIN) 2 g in dextrose 5 % 50 mL IVPB  Status:  Discontinued     2 g 100 mL/hr over 30 Minutes Intravenous  Once 07/19/16 1059 07/19/16 1101   07/19/16 1100  Ampicillin-Sulbactam (UNASYN) 3 g in sodium chloride 0.9 % 100 mL IVPB  Status:  Discontinued     3 g 200 mL/hr over 30 Minutes  Intravenous Every 6 hours 07/19/16 1059 07/19/16 1101       Assessment/Plan Perforated appendicitis with small appendiceal abscess S/P laparoscopic appendectomy 07/20/16 Dr. Manus RuddMatthew Tsuei             POD#2, afebrile, VSS             WBC 19.4 from 21.2- slowly trending down             Post-op ileus: worsening distention and pain, now with nausea. NGT ordered.             Continue IV abx             Will require 7-10 days of PO Augmentin at discharge             Continue to hold Eliquis  FEN: NPO, IVF, limited ice chips for comfort ID: Zosyn 12/16 >>  VTE: SCD's  Plan: NGT decompression, await return of bowel function Continue IV abx and CBC in AM Ambulate/IS    LOS: 3 days    Stanley PhenixElizabeth S Ariza Wells , [facility] 07/22/2016, 9:13 AM Pager: [phone] Consults: [phone] Mon-Fri 7:00 am-4:30 pm Sat-Sun  7:00 am-11:30 am

## 2016-07-22 NOTE — Progress Notes (Signed)
Patient ID: Stanley FreibergJames C Southard, male   DOB: 05/25/1946, 70 y.o.   MRN: [id number]   Patient Name: Stanley Wells Date of Encounter: 07/22/2016  [facility]  Hospital Problem List     Active Problems:   Acute appendicitis   Acute appendicitis with localized peritonitis   Chronic combined systolic and diastolic congestive heart failure (HCC)   Ileus, postoperative (HCC)     Subjective   Still just taking liquids Constipated Mild dyspnea but laying flat in bed   Inpatient Medications    Scheduled Meds: . bisacodyl  10 mg Rectal Once  . Chlorhexidine Gluconate Cloth  6 each Topical Daily  . digoxin  125 mcg Oral Daily  . isosorbide mononitrate  30 mg Oral Daily  . metoprolol succinate  50 mg Oral BH-q7a  . metoprolol succinate  75 mg Oral QPM  . mupirocin ointment  1 application Nasal BID  . piperacillin-tazobactam (ZOSYN)  IV  3.375 g Intravenous Q8H  . rosuvastatin  40 mg Oral q1800  . sacubitril-valsartan  1 tablet Oral BID  . sodium chloride flush  3 mL Intravenous Q12H   Continuous Infusions:  PRN Meds: acetaminophen **OR** acetaminophen, HYDROmorphone (DILAUDID) injection, ondansetron **OR** ondansetron (ZOFRAN) IV, oxyCODONE   Vital Signs    Vitals:   07/21/16 1300 07/21/16 2155 07/22/16 0457 07/22/16 0541  BP: (!) 142/77 (!) 143/77  134/83  Pulse: 96 80  66  Resp: 18 10  16   Temp: 98 F (36.7 C) 98.2 F (36.8 C)  98.1 F (36.7 C)  TempSrc: Oral Oral  Oral  SpO2: 93% 95%  96%  Weight:   219 lb 11.2 oz (99.7 kg)   Height:        Intake/Output Summary (Last 24 hours) at 07/22/16 0737 Last data filed at 07/21/16 1906  Gross per 24 hour  Intake              240 ml  Output              600 ml  Net             -360 ml   Filed Weights   07/20/16 0955 07/21/16 0208 07/22/16 0457  Weight: 207 lb 7.3 oz (94.1 kg) 223 lb 12.8 oz (101.5 kg) 219 lb 11.2 oz (99.7 kg)    Physical Exam    GEN: Well nourished, well developed, in no acute  distress.  HEENT: Grossly normal.  Neck: Supple, no JVD, carotid bruits, or masses. Cardiac: RRR, no murmurs, rubs, or gallops. No clubbing, cyanosis, edema.  Radials/DP/PT 2+ and equal bilaterally.  Respiratory:  Respirations regular and unlabored, clear to auscultation bilaterally. GI: mild distension post lap appy some tympany not tender  MS: no deformity or atrophy. Skin: warm and dry, no rash. Neuro:  Strength and sensation are intact. Psych: AAOx3.  Normal affect.  Labs    CBC  Recent Labs  07/20/16 0348 07/21/16 0525  WBC 22.7* 21.2*  HGB 14.1 12.4*  HCT 42.6 38.0*  MCV 93.0 92.7  PLT 195 187   Basic Metabolic Panel  Recent Labs  07/20/16 0348 07/21/16 0525  NA 136 134*  K 3.8 3.9  CL 102 99*  CO2 24 23  GLUCOSE 142* 157*  BUN 16 36*  CREATININE 1.18 1.87*  CALCIUM 9.2 9.0   Liver Function Tests No results for input(s): AST, ALT, ALKPHOS, BILITOT, PROT, ALBUMIN in the last 72 hours. No results for input(s): LIPASE, AMYLASE in the last  72 hours.   Telemetry    NSR 07/22/2016  - Personally Reviewed  ECG    Sinus arrhythmia no acute ST changes  - Personally Reviewed  Radiology    No results found.  Cardiac Studies   Echo 12/17/15 EF 40-45%   Patient Profile     Stanley Wells is a 70 y.o. male with a history of CAD s/p CABG (2003), ischemic cardiomyopathy/chronic systolic CHF, severe functional mitral regurgitation, and persistent atrial fibrillation who [facility] with nausea and abdominal pain. Found to have acute appendicitis surgery done 12/17  Assessment & Plan    CAD/CABG: distant no chest pain post of stable continue beta blocker resume ASA when ok with surgery  PAF:  ECG SR rate 66 telemetry with isolated PVCls stable  CHF:  Appears euvolemic BMET pending back on entresto will write for home dose lasix if Cr ok this am  Appendicitis:  More abdominal pain today WBC still very high on zosyn plan per general surgery    Signed, Charlton HawsPeter Maxson Oddo, MD  07/22/2016, 7:37 AM  Patient ID: Stanley Wells, male   DOB: 04/05/1946, 70 y.o.   MRN: [id number]

## 2016-07-23 DIAGNOSIS — N179 Acute kidney failure, unspecified: Secondary | ICD-10-CM

## 2016-07-23 LAB — CBC
HCT: 38.2 % — ABNORMAL LOW (ref 39.0–52.0)
Hemoglobin: 12.6 g/dL — ABNORMAL LOW (ref 13.0–17.0)
MCH: 30.1 pg (ref 26.0–34.0)
MCHC: 33 g/dL (ref 30.0–36.0)
MCV: 91.4 fL (ref 78.0–100.0)
Platelets: 258 10*3/uL (ref 150–400)
RBC: 4.18 MIL/uL — ABNORMAL LOW (ref 4.22–5.81)
RDW: 14 % (ref 11.5–15.5)
WBC: 16.7 10*3/uL — ABNORMAL HIGH (ref 4.0–10.5)

## 2016-07-23 LAB — BASIC METABOLIC PANEL
Anion gap: 10 (ref 5–15)
BUN: 36 mg/dL — ABNORMAL HIGH (ref 6–20)
CALCIUM: 8.7 mg/dL — AB (ref 8.9–10.3)
CO2: 28 mmol/L (ref 22–32)
CREATININE: 1.48 mg/dL — AB (ref 0.61–1.24)
Chloride: 97 mmol/L — ABNORMAL LOW (ref 101–111)
GFR calc non Af Amer: 47 mL/min — ABNORMAL LOW (ref >60)
GFR, EST AFRICAN AMERICAN: 54 mL/min — AB (ref >60)
Glucose, Bld: 141 mg/dL — ABNORMAL HIGH (ref 65–99)
Potassium: 3.3 mmol/L — ABNORMAL LOW (ref 3.5–5.1)
Sodium: 135 mmol/L (ref 135–145)

## 2016-07-23 MED ORDER — SPIRONOLACTONE 25 MG PO TABS
12.5000 mg | ORAL_TABLET | Freq: Every day | ORAL | Status: DC
  Administered 2016-07-23 – 2016-07-25 (×3): 12.5 mg via ORAL
  Filled 2016-07-23 (×3): qty 1

## 2016-07-23 MED ORDER — SACUBITRIL-VALSARTAN 24-26 MG PO TABS
1.0000 | ORAL_TABLET | Freq: Two times a day (BID) | ORAL | Status: DC
  Administered 2016-07-23 – 2016-07-25 (×5): 1 via ORAL
  Filled 2016-07-23 (×6): qty 1

## 2016-07-23 MED ORDER — POTASSIUM CHLORIDE CRYS ER 20 MEQ PO TBCR
40.0000 meq | EXTENDED_RELEASE_TABLET | Freq: Once | ORAL | Status: AC
  Administered 2016-07-23: 40 meq via ORAL
  Filled 2016-07-23: qty 2

## 2016-07-23 NOTE — Progress Notes (Signed)
Pharmacy Antibiotic Note  Stanley Wells is a 70 y.o. male admitted on 07/19/2016 with appendicitis.  Pharmacy has been consulted for Zosyn dosing.  Renal function is stable and dose remains appropriate at this time.   Plan: 1. Continue Zosyn 3.375g IV q8, infusing over 4hr 2. Will sign off of Zosyn consult as no dose adjustments expected at this time  Height: 5\' 11"  (180.3 cm) Weight: 219 lb 11.2 oz (99.7 kg) IBW/kg (Calculated) : 75.3  Temp (24hrs), Avg:97.8 F (36.6 C), Min:97.7 F (36.5 C), Max:98 F (36.7 C)   Recent Labs Lab 07/19/16 0500 07/19/16 0540 07/19/16 0839 07/20/16 0348 07/21/16 0525 07/22/16 0746 07/23/16 0313 07/23/16 0818  WBC 19.9*  --   --  22.7* 21.2* 19.4* 16.7*  --   CREATININE 1.13  --   --  1.18 1.87* 1.61*  1.56*  --  1.48*  LATICACIDVEN  --  2.03* 1.35  --   --   --   --   --     Estimated Creatinine Clearance: 56.7 mL/min (by C-G formula based on SCr of 1.48 mg/dL (H)).    No Known Allergies  Antimicrobials this admission: 12/16 Zosyn >>  Dose adjustments this admission: N/A  Microbiology results: 12/16 MRSA PCR >> positive  Thank you for allowing pharmacy to be a part of this patient's care.  Georgina PillionElizabeth Avik Leoni, PharmD, BCPS Clinical Pharmacist Pager: [phone] Clinical phone for 07/23/2016 from 7a-3:30p: [phone] If after 3:30p, please call main pharmacy at: [phone] 07/23/2016 11:44 AM

## 2016-07-23 NOTE — Progress Notes (Signed)
Physical Therapy Treatment and D/C Patient Details Name: Stanley [name]: [id number] DOB: [birth year] Today's Date: 07/23/2016    History of Present Illness 70 yo male admitted via ED on 07/19/16 with RLQ abdominal pain and was diagnosed with acute appendicitis. Pt underwent appendectomy via laparoscopy on 07/20/16. PMH significant for HTN, HLD, ischemia cardiomyopathy, chronic diastolic and systolic CHF, Atrial Fib, CABG x5 2003.     PT Comments    Pt admitted with above diagnosis. Pt currently without significant  functional limitations and is ambulating independently in hallways.  Met  3/4 goals as he declined to practice steps due to the fact he states "I can do that and do not need to practice."  This PT agrees with pt that he is at his baseline.  Will d/c PT as pt no longer needs therapy.    Follow Up Recommendations  No PT follow up     Equipment Recommendations  None recommended by PT    Recommendations for Other Services       Precautions / Restrictions Precautions Precautions: None Restrictions Weight Bearing Restrictions: No    Mobility  Bed Mobility Overal bed mobility: Needs Assistance Bed Mobility: Supine to Sit     Supine to sit: Independent     General bed mobility comments: up in recliner  Transfers Overall transfer level: Needs assistance Equipment used: None Transfers: Sit to/from Stand Sit to Stand: Supervision         General transfer comment: Pt moved his bedside table as he stood up and he knocked water over and it spilled.  Pt impulsive as he was irritated about PT asking him to walk.  However he stated he wanted to show therapist he was fine.    Ambulation/Gait Ambulation/Gait assistance: Independent   Assistive device: None Gait Pattern/deviations: WFL(Within Functional Limits) Gait velocity: decreased Gait velocity interpretation: at or above normal speed for age/gender General Gait Details: Pt without significant LOB with  challenges.  Pt states he is at baseline and "doesn't know why he has PT."   Stairs Stairs:  (declined practice of steps. )          Wheelchair Mobility    Modified Rankin (Stroke Patients Only)       Balance Overall balance assessment: Needs assistance Sitting-balance support: No upper extremity supported;Feet supported Sitting balance-Leahy Scale: Good     Standing balance support: No upper extremity supported;During functional activity Standing balance-Leahy Scale: Good Standing balance comment: Pt with good static and dynamic standing balance.              High level balance activites: Backward walking;Direction changes;Turns;Sudden stops High Level Balance Comments: supervision with all of the above.     Cognition Arousal/Alertness: Awake/alert Behavior During Therapy: WFL for tasks assessed/performed Overall Cognitive Status: Within Functional Limits for tasks assessed                      Exercises General Exercises - Lower Extremity Ankle Circles/Pumps: AROM;Both;10 reps;Seated Long Arc Quad: AROM;Both;10 reps;Seated    General Comments        Pertinent Vitals/Pain Pain Assessment: 0-10 Pain Score: 4  Pain Location: RLQ Pain Descriptors / Indicators: Sore Pain Intervention(s): Monitored during session  VSS    Home Living                      Prior Function            PT Goals (current goals can  now be found in the care plan section) Acute Rehab PT Goals Patient Stated Goal: to get home Progress towards PT goals: Goals met/education completed, patient discharged from PT    Frequency    Min 3X/week      PT Plan Current plan remains appropriate    Co-evaluation             End of Session Equipment Utilized During Treatment: Gait belt Activity Tolerance: Patient tolerated treatment well Patient left: with call bell/phone within reach;in bed;with family/visitor present     Time: 0301-3143 PT Time  Calculation (min) (ACUTE ONLY): 14 min  Charges:  $Gait Training: 8-22 mins                    G Codes:      Godfrey Pick Teofil Maniaci 08-17-16, 12:04 PM Jaleah Lefevre,PT Acute Rehabilitation [phone] (pager)

## 2016-07-23 NOTE — Progress Notes (Signed)
Occupational Therapy Treatment Patient Details Name: Quitman C HaiDebby Freibergthcock MRN: [id number] DOB: 10/14/1945 Today's Date: 07/23/2016    History of present illness 70 yo male admitted via ED on 07/19/16 with RLQ abdominal pain and was diagnosed with acute appendicitis. Pt underwent appendectomy via laparoscopy on 07/20/16. PMH significant for HTN, HLD, ischemia cardiomyopathy, chronic diastolic and systolic CHF, Atrial Fib, CABG x5 2003.    OT comments  Pt making good progress with functional goals. Pt able to stand at sink for washing/drying hands and face. Pt and wife report that pt stood at sink earlier this morning for bathing without difficulty. OT will continue to follow  Follow Up Recommendations  No OT follow up    Equipment Recommendations  None recommended by OT    Recommendations for Other Services      Precautions / Restrictions Precautions Precautions: None Restrictions Weight Bearing Restrictions: No       Mobility Bed Mobility Overal bed mobility: Needs Assistance Bed Mobility: Supine to Sit     Supine to sit: Independent     General bed mobility comments: up in recliner  Transfers Overall transfer level: Needs assistance Equipment used: None Transfers: Sit to/from Stand Sit to Stand: Supervision         General transfer comment: Pt moved his bedside table as he stood up and he knocked water over and it spilled.  Pt impulsive as he was irritated about PT asking him to walk.  However he stated he wanted to show therapist he was fine.      Balance Overall balance assessment: Needs assistance Sitting-balance support: No upper extremity supported;Feet supported Sitting balance-Leahy Scale: Good     Standing balance support: No upper extremity supported;During functional activity Standing balance-Leahy Scale: Good Standing balance comment: Pt reaching for grab bars when available. Fatigueing easily.                   ADL                                    Tub/ Shower Transfer: Supervision/safety;Walk-in shower;Ambulation;3 in 1     General ADL Comments: Pt able to stand at sink for washing/drying hands and face with dist sup. Pt and wife report that pt stood at sink earlier this morning for bathing without difficulty. Reviewed energy conservation handouts with pt and his wife      Vision  no change from baseline                              Cognition   Behavior During Therapy: WFL for tasks assessed/performed Overall Cognitive Status: Within Functional Limits for tasks assessed                       Extremity/Trunk Assessment   WFL                        General Comments  pt pleasant and cooperative    Pertinent Vitals/ Pain       Pain Assessment: 0-10 Pain Score: 4  Pain Location: RLQ Pain Descriptors / Indicators: Sore Pain Intervention(s): Monitored during session  Frequency  Min 2X/week        Progress Toward Goals  OT Goals(current goals can now be found in the care plan section)  Progress towards OT goals: Progressing toward goals  Acute Rehab OT Goals Patient Stated Goal: to get home  Plan Discharge plan remains appropriate                     End of Session     Activity Tolerance Patient tolerated treatment well   Patient Left in chair;with call bell/phone within reach;with family/visitor present             Time: 1047-1058 OT Time Calculation (min): 11 min  Charges: OT General Charges $OT Visit: 1 Procedure OT Treatments $Self Care/Home Management : 8-22 mins  Galen ManilaSpencer, Ornella Coderre Jeanette 07/23/2016, 11:54 AM

## 2016-07-23 NOTE — Progress Notes (Signed)
Patient ID: Stanley FreibergJames C Bree, male   DOB: 05/16/1946, 70 y.o.   MRN: [id number]   Patient Name: Stanley FreibergJames C Portee Date of Encounter: 07/23/2016  [facility]  Hospital Problem List     Active Problems:   Acute appendicitis   Acute appendicitis with localized peritonitis   Chronic combined systolic and diastolic congestive heart failure (HCC)   Ileus, postoperative (HCC)     Subjective   No further abdominal pain.  NGT clamped this morning.    Inpatient Medications    Scheduled Meds: . Chlorhexidine Gluconate Cloth  6 each Topical Daily  . digoxin  125 mcg Oral Daily  . isosorbide mononitrate  30 mg Oral Daily  . metoprolol succinate  50 mg Oral BH-q7a  . metoprolol succinate  75 mg Oral QPM  . mupirocin ointment  1 application Nasal BID  . piperacillin-tazobactam (ZOSYN)  IV  3.375 g Intravenous Q8H  . rosuvastatin  40 mg Oral q1800  . sacubitril-valsartan  1 tablet Oral BID  . sodium chloride flush  3 mL Intravenous Q12H  . spironolactone  12.5 mg Oral Daily   Continuous Infusions: . sodium chloride 75 mL/hr at 07/22/16 1036   PRN Meds:.acetaminophen **OR** acetaminophen, HYDROmorphone (DILAUDID) injection, ondansetron **OR** ondansetron (ZOFRAN) IV, oxyCODONE  Vital Signs    Vitals:   07/22/16 0541 07/22/16 1349 07/22/16 2238 07/23/16 0555  BP: 134/83 119/71 (!) 142/65 127/82  Pulse: 66 80 86 83  Resp: 16 16 17 17   Temp: 98.1 F (36.7 C) 97.8 F (36.6 C) 97.7 F (36.5 C) 98 F (36.7 C)  TempSrc: Oral Oral Oral   SpO2: 96% 96% 96% 96%  Weight:      Height:        Intake/Output Summary (Last 24 hours) at 07/23/16 0919 Last data filed at 07/23/16 0849  Gross per 24 hour  Intake           1582.5 ml  Output              350 ml  Net           1232.5 ml   Filed Weights   07/20/16 0955 07/21/16 0208 07/22/16 0457  Weight: 94.1 kg (207 lb 7.3 oz) 101.5 kg (223 lb 12.8 oz) 99.7 kg (219 lb 11.2 oz)    Physical Exam    GEN: Well nourished,  well developed, in no acute distress.  HEENT: Grossly normal.  Neck: Supple, no JVD, carotid bruits, or masses. Cardiac: RRR, no murmurs, rubs, or gallops. No clubbing, cyanosis, edema.  Radials/DP/PT 2+ and equal bilaterally.  Respiratory:  Respirations regular and unlabored, clear to auscultation bilaterally. GI: mild distension post lap appy some tympany not tender  MS: no deformity or atrophy. Skin: warm and dry, no rash. Neuro:  Strength and sensation are intact. Psych: AAOx3.  Normal affect.  Labs    CBC  Recent Labs  07/22/16 0746 07/23/16 0313  WBC 19.4* 16.7*  HGB 13.7 12.6*  HCT 41.6 38.2*  MCV 92.2 91.4  PLT 239 258   Basic Metabolic Panel  Recent Labs  07/21/16 0525 07/22/16 0746  NA 134* 133*  133*  K 3.9 3.9  3.9  CL 99* 97*  97*  CO2 23 25  26   GLUCOSE 157* 171*  175*  BUN 36* 36*  35*  CREATININE 1.87* 1.61*  1.56*  CALCIUM 9.0 9.1  9.1   Liver Function Tests No results for input(s): AST, ALT, ALKPHOS, BILITOT, PROT, ALBUMIN in  the last 72 hours. No results for input(s): LIPASE, AMYLASE in the last 72 hours.   Telemetry    NSR 07/23/2016  - Personally Reviewed  ECG    Sinus arrhythmia no acute ST changes  - Personally Reviewed  Radiology    Dg Abd Portable 1v  Result Date: 07/22/2016 CLINICAL DATA:  NG tube placement. EXAM: PORTABLE ABDOMEN - 1 VIEW COMPARISON:  CT 07/19/2016 . FINDINGS: NG tube noted with tip projected over the stomach. Side port at the gastroesophageal junction. Advancement of the NG tube approximately 10 cm should be considered. Multiple distended loops of small bowel noted. Colon is also distended. These changes are most consistent adynamic ileus. Developing bowel obstruction cannot be excluded and close follow-up exam suggested to demonstrate resolution . IMPRESSION: 1. NG tube noted with tip in the upper stomach. Side hole at the gastroesophageal junction. Tube advancement of approximately 10 cm should be  considered. 2. Multiple distended loops of bowel most likely adynamic ileus. To demonstrate resolution and to exclude bowel obstruction follow-up exams suggested . Electronically Signed   By: Maisie Fushomas  Register   On: 07/22/2016 11:05    Cardiac Studies   Echo 12/17/15 EF 40-45%   Patient Profile     Stanley FreibergJames C Murcia is a 70 y.o. male with a history of CAD s/p CABG (2003), ischemic cardiomyopathy/chronic systolic CHF, and atrial fibrillation who [facility] with nausea and abdominal pain. Found to have acute appendicitis surgery done 12/17  Assessment & Plan    1. Chronic systolic CHF: EF 09-81%20-25% on TEE from 12/16.  cMRI 2/17 with EF 19%, unable to assess for delayed enhancement on this study.  Ischemic cardiomyopathy.  Low cardiac output by RHC in 1/17 but filling pressures optimized.  Despite low output on RHC, he has been doing quite well with medical treatment.  CPX with only mild functional limitation.  Most recent echo in 5/17 showed improvement in EF to 40-45%, now outside of ICD range. NYHA class II at baseline.  Today, volume status looks ok.  He is getting IV fluid while NPO.    - Stop IV fluid when taking po.   - He can restart Entresto and spironolactone, creatinine is at baseline.  - Continue Toprol XL 50 qam/75 qpm.   - Continue digoxin. 2. CAD: s/p CABG.  On 1/17 cath, only LIMA-LAD and SVG-OM2 still patent.  I do not think that he would derive much benefit from attempting redo CABG. - No ASA as he has been on Eliquis.  Restart Eliquis when ok per surgery.   3. Atrial fibrillation: Paroxysmal.  Successful DCCV in 3/17 on amiodarone, then amiodarone stopped.  In NSR today.   - Restart Eliquis when ok'd by surgery.    4. Mitral regurgitation: Severe by 12/16 TEE, probably functional.  Much improved on 5/17 echo, only trivial. 5. CKD: Stage III. Creatinine lower yesterday, pending today's BMET.  6. Appendicitis: s/p lap appy.  Abdominal pain resolved.  Awaiting resolution of  ileus.    Signed, Marca Anconaalton Ambree Frances, MD  07/23/2016, 9:19 AM

## 2016-07-23 NOTE — Care Management Important Message (Signed)
Important Message  Patient Details  Name: Stanley [name]: [id number] Date of Birth: 01/05/1946   Medicare Important Message Given:  Yes    Stanley Wells 07/23/2016, 11:47 AM

## 2016-07-23 NOTE — Progress Notes (Signed)
Internal Medicine Attending  Date: 07/23/2016  Patient name: Stanley Wells Medical record number: [id number] Date of birth: 11/19/1945 Age: 70 y.o. Gender: male  I saw and evaluated the patient. I reviewed the resident's note by Dr. Antony ContrasGuilloud and I agree with the resident's findings and plans as documented in her progress note.  When seen on rounds this morning Stanley Wells was much improved after having multiple bowel movements throughout the night. He was looking forward to getting the NG tube out. His abdomen was softer although still mildly distended. Bowel sounds were slightly hypoactive. Surgery recommends clamping the NG tube and if tolerated he will start clear liquids. We are hopeful that his postoperative ileus has resolved and he will continue to rapidly improve symptomatically.

## 2016-07-23 NOTE — Progress Notes (Addendum)
Pt has had NO nausea/hicoughs/belching or no change in bloating today, has had 2 loose stools today, passing lots of gas rectally.  Spoke with Dr Lindie SpruceWyatt (on unit ) and order to d/c NG tube was placed. Pt NG tube d/c'd at this time. Tolerated well. Pt instructed on having ice chips (in moderation) and sips of water. MD will reassess po status in the am. Pt verbalized understanding and able to teach back.

## 2016-07-23 NOTE — Progress Notes (Signed)
[facility] Progress Note  3 Days Post-Op  Subjective: Pt states he is feeling much better. He is having minimal abdominal pain worse in the RLQ. Still some abdominal bloating. He is having bowel movements he describes as "hamburger stained with blood". He has had numerous BM's. No nausea, vomiting, fever or chills.   Objective: Vital signs in last 24 hours: Temp:  [97.7 F (36.5 C)-98 F (36.7 C)] 98 F (36.7 C) (12/20 0555) Pulse Rate:  [80-86] 83 (12/20 0555) Resp:  [16-17] 17 (12/20 0555) BP: (119-142)/(65-82) 127/82 (12/20 0555) SpO2:  [96 %] 96 % (12/20 0555) Last BM Date: 07/22/16  Intake/Output from previous day: 12/19 0701 - 12/20 0700 In: 1582.5 [I.V.:1493.5; IV Piggyback:89] Out: 350 [Emesis/NG output:350] Intake/Output this shift: No intake/output data recorded.  PE: Gen:  Alert, NAD, pleasant, cooperative, lying in bed Card:  regular rate, irregular rhythm.  No obvious murmurs, gallops, or rubs noted Pulm:  unlabored, effort normal Abd: Soft, mildly distended and tympanic, +BS, mild TTP to RLQ, incisions C/D/I,  Skin: no rashes noted, warm and dry  Lab Results:   Recent Labs  07/22/16 0746 07/23/16 0313  WBC 19.4* 16.7*  HGB 13.7 12.6*  HCT 41.6 38.2*  PLT 239 258   BMET  Recent Labs  07/21/16 0525 07/22/16 0746  NA 134* 133*  133*  K 3.9 3.9  3.9  CL 99* 97*  97*  CO2 23 25  26   GLUCOSE 157* 171*  175*  BUN 36* 36*  35*  CREATININE 1.87* 1.61*  1.56*  CALCIUM 9.0 9.1  9.1   PT/INR No results for input(s): LABPROT, INR in the last 72 hours. CMP     Component Value Date/Time   NA 133 (L) 07/22/2016 0746   NA 133 (L) 07/22/2016 0746   K 3.9 07/22/2016 0746   K 3.9 07/22/2016 0746   CL 97 (L) 07/22/2016 0746   CL 97 (L) 07/22/2016 0746   CO2 25 07/22/2016 0746   CO2 26 07/22/2016 0746   GLUCOSE 171 (H) 07/22/2016 0746   GLUCOSE 175 (H) 07/22/2016 0746   BUN 36 (H) 07/22/2016 0746   BUN 35 (H) 07/22/2016 0746   CREATININE 1.61 (H) 07/22/2016 0746   CREATININE 1.56 (H) 07/22/2016 0746   CREATININE 0.90 05/01/2015 0942   CALCIUM 9.1 07/22/2016 0746   CALCIUM 9.1 07/22/2016 0746   PROT 7.1 07/19/2016 0500   ALBUMIN 4.1 07/19/2016 0500   AST 32 07/19/2016 0500   ALT 36 07/19/2016 0500   ALKPHOS 66 07/19/2016 0500   BILITOT 1.1 07/19/2016 0500   GFRNONAA 42 (L) 07/22/2016 0746   GFRNONAA 44 (L) 07/22/2016 0746   GFRAA 49 (L) 07/22/2016 0746   GFRAA 51 (L) 07/22/2016 0746   Lipase     Component Value Date/Time   LIPASE 16 07/19/2016 0500       Studies/Results: Dg Abd Portable 1v  Result Date: 07/22/2016 CLINICAL DATA:  NG tube placement. EXAM: PORTABLE ABDOMEN - 1 VIEW COMPARISON:  CT 07/19/2016 . FINDINGS: NG tube noted with tip projected over the stomach. Side port at the gastroesophageal junction. Advancement of the NG tube approximately 10 cm should be considered. Multiple distended loops of small bowel noted. Colon is also distended. These changes are most consistent adynamic ileus. Developing bowel obstruction cannot be excluded and close follow-up exam suggested to demonstrate resolution . IMPRESSION: 1. NG tube noted with tip in the upper stomach. Side hole at the gastroesophageal junction. Tube advancement of approximately  10 cm should be considered. 2. Multiple distended loops of bowel most likely adynamic ileus. To demonstrate resolution and to exclude bowel obstruction follow-up exams suggested . Electronically Signed   By: Maisie Fushomas  Register   On: 07/22/2016 11:05    Anti-infectives: Anti-infectives    Start     Dose/Rate Route Frequency Ordered Stop   07/19/16 1200  piperacillin-tazobactam (ZOSYN) IVPB 3.375 g     3.375 g 12.5 mL/hr over 240 Minutes Intravenous Every 8 hours 07/19/16 1126     07/19/16 1100  cefTRIAXone (ROCEPHIN) 2 g in dextrose 5 % 50 mL IVPB  Status:  Discontinued     2 g 100 mL/hr over 30 Minutes Intravenous  Once 07/19/16 1059 07/19/16 1101   07/19/16 1100   Ampicillin-Sulbactam (UNASYN) 3 g in sodium chloride 0.9 % 100 mL IVPB  Status:  Discontinued     3 g 200 mL/hr over 30 Minutes Intravenous Every 6 hours 07/19/16 1059 07/19/16 1101       Assessment/Plan Perforated appendicitis with small appendiceal abscess S/P laparoscopic appendectomy 07/20/16 Dr. Manus RuddMatthew Tsuei   - POD#3, afebrile, VSS - leukocytosis- slowly trending down - Post-op ileus: still mild distention but decreased pain, NG output 350 overnight, having BM's and  +BS, will clamp NG tube and see how he does. If he tolerates the clamped tube we will start clears  this afternoon - Continue IV abx - Will require 7-10 days of PO Augmentin at discharge - Continue to hold Eliquis  FEN: NPO, IVF, ice chips, NG tube clamped ID: Zosyn 12/16 >> VTE: SCD's  Plan: NGT clamped, if he tolerates this then we can move to clears this afternoon Continue IV abx and CBC in AM Ambulate/IS    LOS: 4 days    Jerre SimonJessica L Sven Pinheiro , [facility] 07/23/2016, 7:34 AM Pager: [phone] Consults: [phone] Mon-Fri 7:00 am-4:30 pm Sat-Sun 7:00 am-11:30 am

## 2016-07-23 NOTE — Progress Notes (Signed)
   Subjective: Patient is feeling better today and less distended. Reports multiple BM after suppository yesterday. Pain is improving.   Objective:  Vital signs in last 24 hours: Vitals:   07/22/16 0541 07/22/16 1349 07/22/16 2238 07/23/16 0555  BP: 134/83 119/71 (!) 142/65 127/82  Pulse: 66 80 86 83  Resp: 16 16 17 17   Temp: 98.1 F (36.7 C) 97.8 F (36.6 C) 97.7 F (36.5 C) 98 F (36.7 C)  TempSrc: Oral Oral Oral   SpO2: 96% 96% 96% 96%  Weight:      Height:       Physical Exam Constitutional: NAD, appears comfortable, NGT in place  Cardiovascular:Irregularly irregular rhythm, normal rate, no murmurs, rubs, or gallops.  Pulmonary/Chest:CTAB, no wheezes, rales, or rhonchi.  Abdominal:Post surgical bandages in place, c/d/i. Abdomen is less distended today, soft and only mildly tender to palpation.  +BS Extremities: Warm and well perfused. Distal pulses intact. No edema.  Psychiatric:Normal mood and affect   Assessment/Plan:  Acute Appendicitis: S/p laparoscopic appendectomy 12/17 by Dr. Corliss Skainssuei, complicated by post op ileus. NGT was placed yesterday with 350 cc drained overnight. He has had multiple BM after suppository yesterday. Pain and distention are improving. Per surgery, will plan for NGT clamping trial today. If he tolerates clamped tube he can try a clear diet this afternoon. He has remained afebrile and leukocytosis is improving 21 -> 19.4 -> 16.7 today. Appendix was perforated per op note with purulent fluid. Continue treatment with IV Zosyn (day 5). Plan for course of Augmentin at discharge.  -- Surgery following, appreciate recommendations  -- NGT clamping trial today; clear liquids this afternoon if he tolerates clamping  -- IVFs while NPO  -- Pain regimen per surgery  -- Continue IV Zosyn q8 hours (day 5)  Atrial Fibrillation/Atrial Flutter: Persistent a-fib with hx of atrial flutter in 2015 requiring DC cardioversion. Rate controlled on metoprolol,  amiodarone, and digoxin. Holding Eliquis in setting of recent surgery. This patients CHA2DS2-VASc Score and unadjusted Ischemic Stroke Rate (% per year) is equal to 3.2 %. No indication for heparin bridge at this time. Will continue to hold Eliquis.  -- Cardiology following; appreciate recommendations  -- Continue digoxin 125 mcg daily  -- Continue metoprolol 50 mg qAM and 75 mg QHS -- Holding Eliquis until ok with surgery   Chronic Combined Systolic and Diastolic Heart Failure:Secondary to ischemic cardiomyopathy. Follows with Dr. Shirlee LatchMcLean. Currently well compensated and medically optimized.  -- Cardiology following; appreciate recommendations  -- Continue metoprolol and digoxin -- Restarted home imdur, spironolactone, and entresto toady per cardiology, AKI improving  -- Will DC fluids once patient is tolerating PO   AKI: Creatine 1.13 on admission. Up to 1.87 after surgery, likely due to NPO status. Has been improving with IVFs but patient is still NPO. Down to 1.48 today.  -- IVFs while NPO -- Spironolactone and entresto held yesterday; restarted today per cardiology  -- Daily labs   HTN: Normotensive. -- Continue meds as above   HLD: -- Continue crestor 40 mg daily   FEN: 1/2 NS 75 cc/hr,replete lytes prn, clear liquids VTE ppx: SCDs, holding Eliquis  Code Status: FULL  Dispo: Anticipated discharge in approximately 1-2 days pending NGT removal and once patient is tolerating PO.   Stanley Pollarolyn Crimson Dubberly, MD 07/23/2016, 9:37 AM Pager: [phone]

## 2016-07-23 NOTE — Progress Notes (Signed)
Physical Therapy Discharge Patient Details Name: Stanley [name]: [id number] DOB: 09/16/1945 Today's Date: 07/23/2016 Time: 7209-4709 PT Time Calculation (min) (ACUTE ONLY): 14 min  Patient discharged from PT services secondary to goals met and no further PT needs identified.  Please see latest therapy progress note for current level of functioning and progress toward goals.    Progress and discharge plan discussed with patient and/or caregiver: Patient/Caregiver agrees with plan  GP     Denice Paradise 07/23/2016, 12:06 PM   [facility] [phone] (pager)

## 2016-07-24 LAB — BASIC METABOLIC PANEL
Anion gap: 8 (ref 5–15)
BUN: 29 mg/dL — AB (ref 6–20)
CALCIUM: 8.5 mg/dL — AB (ref 8.9–10.3)
CO2: 27 mmol/L (ref 22–32)
CREATININE: 1.43 mg/dL — AB (ref 0.61–1.24)
Chloride: 101 mmol/L (ref 101–111)
GFR calc Af Amer: 56 mL/min — ABNORMAL LOW (ref >60)
GFR calc non Af Amer: 48 mL/min — ABNORMAL LOW (ref >60)
GLUCOSE: 134 mg/dL — AB (ref 65–99)
POTASSIUM: 3.5 mmol/L (ref 3.5–5.1)
SODIUM: 136 mmol/L (ref 135–145)

## 2016-07-24 LAB — CBC
HCT: 36 % — ABNORMAL LOW (ref 39.0–52.0)
Hemoglobin: 11.9 g/dL — ABNORMAL LOW (ref 13.0–17.0)
MCH: 30.7 pg (ref 26.0–34.0)
MCHC: 33.1 g/dL (ref 30.0–36.0)
MCV: 93 fL (ref 78.0–100.0)
PLATELETS: 243 10*3/uL (ref 150–400)
RBC: 3.87 MIL/uL — ABNORMAL LOW (ref 4.22–5.81)
RDW: 14.4 % (ref 11.5–15.5)
WBC: 13.9 10*3/uL — ABNORMAL HIGH (ref 4.0–10.5)

## 2016-07-24 MED ORDER — HYDROMORPHONE HCL 2 MG/ML IJ SOLN: 0.5000 mg | INTRAMUSCULAR | Status: DC | PRN

## 2016-07-24 MED ORDER — OXYCODONE HCL 5 MG PO TABS: 5.0000 mg | ORAL_TABLET | ORAL | Status: DC | PRN

## 2016-07-24 MED ORDER — APIXABAN 5 MG PO TABS
5.0000 mg | ORAL_TABLET | Freq: Two times a day (BID) | ORAL | Status: DC
  Administered 2016-07-24 – 2016-07-25 (×3): 5 mg via ORAL
  Filled 2016-07-24 (×3): qty 1

## 2016-07-24 MED ORDER — DOCUSATE SODIUM 100 MG PO CAPS
100.0000 mg | ORAL_CAPSULE | Freq: Two times a day (BID) | ORAL | Status: DC
  Filled 2016-07-24 (×3): qty 1

## 2016-07-24 NOTE — Progress Notes (Signed)
[facility] Progress Note  4 Days Post-Op  Subjective: Pt is feeling good. No complaints. He is having flatus and BM's. No nausea, vomiting, abdominal bloating, fever, chills, cough over night.   Objective: Vital signs in last 24 hours: Temp:  [98.3 F (36.8 C)-99.1 F (37.3 C)] 98.4 F (36.9 C) (12/21 0600) Pulse Rate:  [79-81] 80 (12/21 0600) Resp:  [17-18] 18 (12/21 0600) BP: (123-137)/(60-70) 125/60 (12/21 0600) SpO2:  [97 %] 97 % (12/21 0600) Weight:  [209 lb 3.2 oz (94.9 kg)] 209 lb 3.2 oz (94.9 kg) (12/21 0600) Last BM Date: 07/23/16  Intake/Output from previous day: 12/20 0701 - 12/21 0700 In: 1336.3 [P.O.:90; I.V.:1166.3; IV Piggyback:50] Out: -  Intake/Output this shift: No intake/output data recorded.  PE: Gen:  Alert, NAD, pleasant, cooperative, lying in bed Card:  regular rate, irregular rhythm. No obvious murmurs, gallops, or rubs noted Pulm:  unlabored, effort normal, CTA Abd: Soft, ND, +BS, mild TTP to RLQ, incisions C/D/I,  Skin: no rashes noted, warm and dry  Lab Results:   Recent Labs  07/23/16 0313 07/24/16 0539  WBC 16.7* 13.9*  HGB 12.6* 11.9*  HCT 38.2* 36.0*  PLT 258 243   BMET  Recent Labs  07/23/16 0818 07/24/16 0539  NA 135 136  K 3.3* 3.5  CL 97* 101  CO2 28 27  GLUCOSE 141* 134*  BUN 36* 29*  CREATININE 1.48* 1.43*  CALCIUM 8.7* 8.5*   PT/INR No results for input(s): LABPROT, INR in the last 72 hours. CMP     Component Value Date/Time   NA 136 07/24/2016 0539   K 3.5 07/24/2016 0539   CL 101 07/24/2016 0539   CO2 27 07/24/2016 0539   GLUCOSE 134 (H) 07/24/2016 0539   BUN 29 (H) 07/24/2016 0539   CREATININE 1.43 (H) 07/24/2016 0539   CREATININE 0.90 05/01/2015 0942   CALCIUM 8.5 (L) 07/24/2016 0539   PROT 7.1 07/19/2016 0500   ALBUMIN 4.1 07/19/2016 0500   AST 32 07/19/2016 0500   ALT 36 07/19/2016 0500   ALKPHOS 66 07/19/2016 0500   BILITOT 1.1 07/19/2016 0500   GFRNONAA 48 (L) 07/24/2016 0539    GFRAA 56 (L) 07/24/2016 0539   Lipase     Component Value Date/Time   LIPASE 16 07/19/2016 0500       Studies/Results: Dg Abd Portable 1v  Result Date: 07/22/2016 CLINICAL DATA:  NG tube placement. EXAM: PORTABLE ABDOMEN - 1 VIEW COMPARISON:  CT 07/19/2016 . FINDINGS: NG tube noted with tip projected over the stomach. Side port at the gastroesophageal junction. Advancement of the NG tube approximately 10 cm should be considered. Multiple distended loops of small bowel noted. Colon is also distended. These changes are most consistent adynamic ileus. Developing bowel obstruction cannot be excluded and close follow-up exam suggested to demonstrate resolution . IMPRESSION: 1. NG tube noted with tip in the upper stomach. Side hole at the gastroesophageal junction. Tube advancement of approximately 10 cm should be considered. 2. Multiple distended loops of bowel most likely adynamic ileus. To demonstrate resolution and to exclude bowel obstruction follow-up exams suggested . Electronically Signed   By: Maisie Fushomas  Register   On: 07/22/2016 11:05    Anti-infectives: Anti-infectives    Start     Dose/Rate Route Frequency Ordered Stop   07/19/16 1200  piperacillin-tazobactam (ZOSYN) IVPB 3.375 g     3.375 g 12.5 mL/hr over 240 Minutes Intravenous Every 8 hours 07/19/16 1126     07/19/16 1100  cefTRIAXone (ROCEPHIN) 2 g in dextrose 5 % 50 mL IVPB  Status:  Discontinued     2 g 100 mL/hr over 30 Minutes Intravenous  Once 07/19/16 1059 07/19/16 1101   07/19/16 1100  Ampicillin-Sulbactam (UNASYN) 3 g in sodium chloride 0.9 % 100 mL IVPB  Status:  Discontinued     3 g 200 mL/hr over 30 Minutes Intravenous Every 6 hours 07/19/16 1059 07/19/16 1101       Assessment/Plan  Perforated appendicitis with small appendiceal abscess S/P laparoscopic appendectomy 07/20/16 Dr. Manus RuddMatthew Tsuei   - POD#4, afebrile, VSS - leukocytosis- slowly trending down - Post-op ileus:  NG d/c'd last night, having BM's, +BS, no N/V, will start clears today - Continue IV abx - Will require 7-10 days of PO Augmentin at discharge - Continue to hold Eliquis  FEN: clears ID: Zosyn 12/16 >> VTE: SCD's  Plan: clears Continue IV abx and CBC in AM to check WBC Ambulate/IS      LOS: 5 days    Jerre SimonJessica L Luticia Tadros , [facility] 07/24/2016, 7:19 AM Pager: [phone] Consults: [phone] Mon-Fri 7:00 am-4:30 pm Sat-Sun 7:00 am-11:30 am

## 2016-07-24 NOTE — Progress Notes (Signed)
   Subjective: Patient reports his abdominal pain and distention continue to improve. He tolerated the NGT clamping trial well yesterday and had it pulled yesterday evening. He continues to have BM but had not yet tried any PO this morning on rounds. Denies N/V.   Objective:  Vital signs in last 24 hours: Vitals:   07/23/16 0555 07/23/16 1333 07/23/16 2138 07/24/16 0600  BP: 127/82 137/65 123/70 125/60  Pulse: 83 79 81 80  Resp: 17 17 18 18   Temp: 98 F (36.7 C) 98.3 F (36.8 C) 99.1 F (37.3 C) 98.4 F (36.9 C)  TempSrc:  Oral Oral   SpO2: 96% 97% 97% 97%  Weight:    209 lb 3.2 oz (94.9 kg)  Height:       Physical Exam Constitutional: NAD, appears comfortable Cardiovascular:Irregularly irregular rhythm, normal rate, no murmurs, rubs, or gallops.  Pulmonary/Chest:CTAB, no wheezes, rales, or rhonchi.  Abdominal:Post surgical bandages in place, c/d/i. Abdomen is somewhat distended, but soft and non tender to palpation.  +BS Extremities: Warm and well perfused. Distal pulses intact. No edema.  Psychiatric:Normal mood and affect  Assessment/Plan:  Acute Appendicitis:S/p laparoscopic appendectomy 12/17 by Dr. Corliss Skainssuei, complicated by post op ileus. NGT pulled yesterday. He has not yet attempted PO. He continues to have BM and pass flatus. Denies N/V. Leukocytosis continues to improve, down to 13.9 today. Treating with IV Zosyn (day 6) with plans for a course of Augmentin at discharge. Will advance diet to clears today. If patient is tolerating PO he may be ready for discharge tomorrow.  -- Surgery following, appreciate recommendations  -- NGT pulled; advance diet to clears  -- IVFs discontinued  -- Pain regimen per surgery  -- Continue IV Zosyn q8 hours (day 6)  Atrial Fibrillation/Atrial Flutter: Persistent a-fib with hx of atrial flutter in 2015 requiring DC cardioversion. Rate controlled on metoprolol, amiodarone, and digoxin. Holding Eliquis in setting of recent surgery.  This patients CHA2DS2-VASc Score and unadjusted Ischemic Stroke Rate is 3.2 % per year. No indication for heparin bridge at this time. Will continue to hold Eliquis.  -- Cardiology following; appreciate recommendations  -- Continue digoxin 125 mcg daily  -- Continue metoprolol 50 mg qAM and 75 mg QHS -- Holding Eliquis until ok with surgery   Chronic Combined Systolic and Diastolic Heart Failure:Secondary to ischemic cardiomyopathy. Follows with Dr. Shirlee LatchMcLean. Currently well compensated and medically optimized.  -- Cardiology following; appreciate recommendations  -- Continue metoprolol and digoxin -- Continue home imdur, spironolactone, and entresto  -- Fluids DC'd   AKI: Creatine 1.13 on admission. Up to 1.87 after surgery, likely due to NPO status. Spironolactone and entresto were held for one day and creatinine improved some with IVFs, down to 1.43 today. Advance diet as tolerated.  -- Daily labs   HTN: Normotensive. -- Continue meds as above   HLD: -- Continue crestor 40 mg daily   FEN: No fluids,replete lytes prn, clear liquids VTE ppx: SCDs, holding Eliquis  Code Status: FULL  Dispo: Anticipated discharge hopefully tomorrow if patient is tolerating PO.  Reymundo Pollarolyn Torey Regan, MD 07/24/2016, 10:16 AM Pager: [phone]

## 2016-07-24 NOTE — Progress Notes (Signed)
Internal Medicine Attending  Date: 07/24/2016  Patient name: Stanley Wells Medical record number: [id number] Date of birth: 03/10/1946 Age: 70 y.o. Gender: male  I saw and evaluated the patient. I reviewed the resident's note by Dr. Antony ContrasGuilloud and I agree with the resident's findings and plans as documented in her progress note.  When seen on rounds this morning Stanley Wells was feeling much improved. His abdomen was soft and his bowel sounds were active. He was passing flatus and having bowel movements. The NG tube was removed the night before and he had no difficulties afterwards. His diet has been advanced to a full diet and if he tolerates it for dinner he will likely be ready for discharge home tomorrow. We will reassess in the a.m.

## 2016-07-24 NOTE — Progress Notes (Signed)
Patient ID: Stanley Wells, male   DOB: 10/13/1945, 70 y.o.   MRN: [id number]   Patient Name: Stanley FreibergJames C Kinker Date of Encounter: 07/24/2016  [facility]  Hospital Problem List     Active Problems:   Acute appendicitis   Acute appendicitis with localized peritonitis   Chronic combined systolic and diastolic congestive heart failure (HCC)   Ileus, postoperative (HCC)    Subjective   NGT out. Tolerating clear diet. + BMs. Feeling better  Inpatient Medications    Scheduled Meds: . digoxin  125 mcg Oral Daily  . isosorbide mononitrate  30 mg Oral Daily  . metoprolol succinate  50 mg Oral BH-q7a  . metoprolol succinate  75 mg Oral QPM  . mupirocin ointment  1 application Nasal BID  . piperacillin-tazobactam (ZOSYN)  IV  3.375 g Intravenous Q8H  . rosuvastatin  40 mg Oral q1800  . sacubitril-valsartan  1 tablet Oral BID  . sodium chloride flush  3 mL Intravenous Q12H  . spironolactone  12.5 mg Oral Daily   Continuous Infusions:  PRN Meds:.acetaminophen **OR** acetaminophen, HYDROmorphone (DILAUDID) injection, ondansetron **OR** ondansetron (ZOFRAN) IV, oxyCODONE  Vital Signs    Vitals:   07/23/16 0555 07/23/16 1333 07/23/16 2138 07/24/16 0600  BP: 127/82 137/65 123/70 125/60  Pulse: 83 79 81 80  Resp: 17 17 18 18   Temp: 98 F (36.7 C) 98.3 F (36.8 C) 99.1 F (37.3 C) 98.4 F (36.9 C)  TempSrc:  Oral Oral   SpO2: 96% 97% 97% 97%  Weight:    209 lb 3.2 oz (94.9 kg)  Height:        Intake/Output Summary (Last 24 hours) at 07/24/16 0839 Last data filed at 07/24/16 [id number]  Gross per 24 hour  Intake          1936.25 ml  Output                0 ml  Net          1936.25 ml   Filed Weights   07/21/16 0208 07/22/16 0457 07/24/16 0600  Weight: 223 lb 12.8 oz (101.5 kg) 219 lb 11.2 oz (99.7 kg) 209 lb 3.2 oz (94.9 kg)    Physical Exam    GEN: Well appearing, NAD.  HEENT: Normal Neck: Supple, no JVD, carotid bruits, or masses. Cardiac: Regular.  No M/G/R. No clubbing, cyanosis, edema.  Radials/DP/PT 2+ and equal bilaterally.  Respiratory:  CTAB, normal effort. GI: Mildly distended post lap appy. Non tender.   MS: no deformity or atrophy. Skin: warm and dry, no rash. Neuro:  Strength and sensation are intact. Psych: AAOx3.  Normal affect.  Labs    CBC  Recent Labs  07/23/16 0313 07/24/16 0539  WBC 16.7* 13.9*  HGB 12.6* 11.9*  HCT 38.2* 36.0*  MCV 91.4 93.0  PLT 258 243   Basic Metabolic Panel  Recent Labs  07/23/16 0818 07/24/16 0539  NA 135 136  K 3.3* 3.5  CL 97* 101  CO2 28 27  GLUCOSE 141* 134*  BUN 36* 29*  CREATININE 1.48* 1.43*  CALCIUM 8.7* 8.5*   Liver Function Tests No results for input(s): AST, ALT, ALKPHOS, BILITOT, PROT, ALBUMIN in the last 72 hours. No results for input(s): LIPASE, AMYLASE in the last 72 hours.   Telemetry    NSR 07/24/2016  - Personally Reviewed  ECG    Sinus arrhythmia no acute ST changes  - Personally Reviewed  Radiology    Dg Abd Portable  1v  Result Date: 07/22/2016 CLINICAL DATA:  NG tube placement. EXAM: PORTABLE ABDOMEN - 1 VIEW COMPARISON:  CT 07/19/2016 . FINDINGS: NG tube noted with tip projected over the stomach. Side port at the gastroesophageal junction. Advancement of the NG tube approximately 10 cm should be considered. Multiple distended loops of small bowel noted. Colon is also distended. These changes are most consistent adynamic ileus. Developing bowel obstruction cannot be excluded and close follow-up exam suggested to demonstrate resolution . IMPRESSION: 1. NG tube noted with tip in the upper stomach. Side hole at the gastroesophageal junction. Tube advancement of approximately 10 cm should be considered. 2. Multiple distended loops of bowel most likely adynamic ileus. To demonstrate resolution and to exclude bowel obstruction follow-up exams suggested . Electronically Signed   By: Maisie Fushomas  Register   On: 07/22/2016 11:05    Cardiac Studies   Echo  12/17/15 EF 40-45%   Patient Profile     Stanley Wells is a 70 y.o. male with a history of CAD s/p CABG (2003), ischemic cardiomyopathy/chronic systolic CHF, and atrial fibrillation who [facility] and abdominal pain. Found to have acute appendicitis surgery done 12/17  Assessment & Plan    1. Chronic systolic CHF: EF 09-81%20-25% on TEE from 12/16.  cMRI 2/17 with EF 19%, unable to assess for delayed enhancement on this study.  Ischemic cardiomyopathy.  Low cardiac output by RHC in 1/17 but filling pressures optimized.  Despite low output on RHC, he has been doing quite well with medical treatment.  CPX with only mild functional limitation.  Most recent echo in 5/17 showed improvement in EF to 40-45%, now outside of ICD range. NYHA class II at baseline.   - Volume status. Has been getting IV fluid. Stop IV fluid now that patient is taking clears.    - Continue Entresto 24/26 mg BID.  - Continue spironolactone 12.5 mg daily.   - Continue Toprol XL 50 qam/75 qpm.   - Continue digoxin. 2. CAD: s/p CABG.  On 1/17 cath, only LIMA-LAD and SVG-OM2 still patent.  I do not think that he would derive much benefit from attempting redo CABG. - No ASA as he has been on Eliquis.  Restart Eliquis when ok per surgery.   3. Atrial fibrillation: Paroxysmal.  Successful DCCV in 3/17 on amiodarone, then amiodarone stopped.   - Remains in NSR currently. - Resume Eliquis 5 mg BID.  OK per surgery. 4. Mitral regurgitation: Severe by 12/16 TEE, probably functional.  Much improved on 5/17 echo, only trivial. 5. CKD: Stage III.  - Creatinine trending down.  Continue to follow.   6. Appendicitis: s/p lap appy.  Abdominal pain resolved.  Awaiting resolution of ileus.    Stanley FreerMichael Andrew Tillery, PA-C  07/24/2016, 8:39 AM   Advanced Heart Failure Team Pager [phone] (M-F; 7a - 4p)  Please contact [facility] for night-coverage after hours (4p -7a ) and weekends on [email or web address]  Patient seen and  examined with Otilio SaberAndy Tillery, PA-C. We discussed all aspects of the encounter. I agree with the assessment and plan as stated above.   Stable from a cardiac perspective. Can stop IVF. Continue current HF regimen. Resume Eliquis.  Anniebell Bedore,MD 6:02 PM

## 2016-07-24 NOTE — Consult Note (Signed)
   [facility] Consult   07/24/2016  [name] 10/09/1945 [id number]      [facility]. Came to visit [name] at bedside to discuss EMMI discharge calls for CHF management. [name] declines EMMI post discharge calls at this time. Also discussed [facility] program as well. He declined both EMMI follow up calls and [facility] services. Will make inpatient RNCM aware.    Raiford NobleAtika Audyn Dimercurio, MSN-Ed, RN,BSN [facility] Care Management Hospital Liaison [phone]

## 2016-07-25 DIAGNOSIS — K352 Acute appendicitis with generalized peritonitis: Secondary | ICD-10-CM

## 2016-07-25 LAB — BASIC METABOLIC PANEL
ANION GAP: 8 (ref 5–15)
BUN: 21 mg/dL — ABNORMAL HIGH (ref 6–20)
CALCIUM: 8.4 mg/dL — AB (ref 8.9–10.3)
CHLORIDE: 101 mmol/L (ref 101–111)
CO2: 27 mmol/L (ref 22–32)
CREATININE: 1.33 mg/dL — AB (ref 0.61–1.24)
GFR calc Af Amer: >60 mL/min (ref >60)
GFR calc non Af Amer: 53 mL/min — ABNORMAL LOW (ref >60)
GLUCOSE: 134 mg/dL — AB (ref 65–99)
Potassium: 3.2 mmol/L — ABNORMAL LOW (ref 3.5–5.1)
Sodium: 136 mmol/L (ref 135–145)

## 2016-07-25 LAB — CBC
HEMATOCRIT: 36.6 % — AB (ref 39.0–52.0)
Hemoglobin: 11.9 g/dL — ABNORMAL LOW (ref 13.0–17.0)
MCH: 30 pg (ref 26.0–34.0)
MCHC: 32.5 g/dL (ref 30.0–36.0)
MCV: 92.2 fL (ref 78.0–100.0)
Platelets: 264 10*3/uL (ref 150–400)
RBC: 3.97 MIL/uL — ABNORMAL LOW (ref 4.22–5.81)
RDW: 14.1 % (ref 11.5–15.5)
WBC: 15 10*3/uL — ABNORMAL HIGH (ref 4.0–10.5)

## 2016-07-25 MED ORDER — AMOXICILLIN-POT CLAVULANATE 875-125 MG PO TABS: 1.0000 | ORAL_TABLET | Freq: Two times a day (BID) | ORAL | 0 refills | Status: DC

## 2016-07-25 NOTE — Discharge Instructions (Signed)
please arrive at least 30 min before your appointment to complete your check in paperwork.  If you are unable to arrive 30 min prior to your appointment time we may have to cancel or reschedule you. ° °LAPAROSCOPIC SURGERY: POST OP INSTRUCTIONS  °1. DIET: Follow a light bland diet the first 24 hours after arrival home, such as soup, liquids, crackers, etc. Be sure to include lots of fluids daily. Avoid fast food or heavy meals as your are more likely to get nauseated. Eat a low fat the next few days after surgery.  °2. Take your usually prescribed home medications unless otherwise directed. °3. PAIN CONTROL:  °1. Pain is best controlled by a usual combination of three different methods TOGETHER:  °1. Ice/Heat °2. Over the counter pain medication °3. Prescription pain medication °2. Most patients will experience some swelling and bruising around the incisions. Ice packs or heating pads (30-60 minutes up to 6 times a day) will help. Use ice for the first few days to help decrease swelling and bruising, then switch to heat to help relax tight/sore spots and speed recovery. Some people prefer to use ice alone, heat alone, alternating between ice & heat. Experiment to what works for you. Swelling and bruising can take several weeks to resolve.  °3. It is helpful to take an over-the-counter pain medication regularly for the first few weeks. Choose one of the following that works best for you:  °1. Naproxen (Aleve, etc) Two 220mg tabs twice a day °2. Ibuprofen (Advil, etc) Three 200mg tabs four times a day (every meal & bedtime) °3. Acetaminophen (Tylenol, etc) 500-650mg four times a day (every meal & bedtime) °4. A prescription for pain medication (such as oxycodone, hydrocodone, etc) should be given to you upon discharge. Take your pain medication as prescribed.  °1. If you are having problems/concerns with the prescription medicine (does not control pain, nausea, vomiting, rash, itching, etc), please call us [phone] to see if we need to switch you to a different pain medicine that will work better for you and/or control your side effect better. °2. If you need a refill on your pain medication, please contact your pharmacy. They will contact our office to request authorization. Prescriptions will not be filled after 5 pm or on week-ends. °4. Avoid getting constipated. Between the surgery and the pain medications, it is common to experience some constipation. Increasing fluid intake and taking a fiber supplement (such as Metamucil, Citrucel, FiberCon, MiraLax, etc) 1-2 times a day regularly will usually help prevent this problem from occurring. A mild laxative (prune juice, Milk of Magnesia, MiraLax, etc) should be taken according to package directions if there are no bowel movements after 48 hours.  °5. Watch out for diarrhea. If you have many loose bowel movements, simplify your diet to bland foods & liquids for a few days. Stop any stool softeners and decrease your fiber supplement. Switching to mild anti-diarrheal medications (Kayopectate, Pepto Bismol) can help. If this worsens or does not improve, please call us. °6. Wash / shower every day. You may shower over the dressings as they are waterproof. Continue to shower over incision(s) after the dressing is off. °7. Remove your waterproof bandages 5 days after surgery. You may leave the incision open to air. You may replace a dressing/Band-Aid to cover the incision for comfort if you wish.  °8. ACTIVITIES as tolerated:  °1. You may resume regular (light) daily activities beginning the next day--such as daily self-care, walking, climbing stairs--gradually   increasing activities as tolerated. If you can walk 30 minutes without difficulty, it is safe to try more intense activity such as jogging, treadmill, bicycling, low-impact aerobics, swimming, etc. °2. Save the most intensive and strenuous activity for last such as sit-ups, heavy lifting, contact sports, etc Refrain  from any heavy lifting or straining until you are off narcotics for pain control.  °3. DO NOT PUSH THROUGH PAIN. Let pain be your guide: If it hurts to do something, don't do it. Pain is your body warning you to avoid that activity for another week until the pain goes down. °4. You may drive when you are no longer taking prescription pain medication, you can comfortably wear a seatbelt, and you can safely maneuver your car and apply brakes. °5. You may have sexual intercourse when it is comfortable.  °9. FOLLOW UP in our office  °1. Please call [facility] at [phone] to set up an appointment to see your surgeon in the office for a follow-up appointment approximately 2-3 weeks after your surgery. °2. Make sure that you call for this appointment the day you arrive home to insure a convenient appointment time. °     10. IF YOU HAVE DISABILITY OR FAMILY LEAVE FORMS, BRING THEM TO THE               OFFICE FOR PROCESSING.  ° °WHEN TO CALL US [phone]:  °1. Poor pain control °2. Reactions / problems with new medications (rash/itching, nausea, etc)  °3. Fever over 101.5 F (38.5 C) °4. Inability to urinate °5. Nausea and/or vomiting °6. Worsening swelling or bruising °7. Continued bleeding from incision. °8. Increased pain, redness, or drainage from the incision ° °The clinic staff is available to answer your questions during regular business hours (8:30am-5pm). Please don’t hesitate to call and ask to speak to one of our nurses for clinical concerns.  °If you have a medical emergency, go to the nearest emergency room or call 911.  °A surgeon from [facility] is always on call at the hospitals  ° °[facility]  °[address], [address], [place], [place] [address] ?  °MAIN: [phone] ? TOLL FREE: [phone] ?  °FAX [phone]  °[email or web address] ° °

## 2016-07-25 NOTE — Progress Notes (Signed)
Internal Medicine Attending  Date: 07/25/2016  Patient name: Stanley FreibergJames C Menter Medical record number: [id number] Date of birth: 02/21/1946 Age: 70 y.o. Gender: male  I saw and evaluated the patient. I reviewed the resident's note by Dr. Karma GreaserBoswell and I agree with the resident's findings and plans as documented in his progress note.  [name] tolerated a regular diet yesterday and is feeling well. He continues to pass flatus and have bowel movements. His abdominal exam is nontender with active bowel sounds. He is being discharged to complete a 10 day course of antibiotics with the conversion to oral Augmentin today. We will also resume his Eliquis.

## 2016-07-25 NOTE — Progress Notes (Signed)
Winn JockJames C Royster to be D/C'd Home per MD order.  Discussed with the patient and all questions fully answered.  VSS. Steri strips in place over surgical incisions.  IV catheter discontinued intact. Site without signs and symptoms of complications. Dressing and pressure applied.  An After Visit Summary was printed and given to the patient. Patient received prescription.  D/c education completed with patient/family including follow up instructions, medication list, d/c activities limitations if indicated, with other d/c instructions as indicated by MD - patient able to verbalize understanding, all questions fully answered.   Patient instructed to return to ED, call 911, or call MD for any changes in condition.   Patient to be escorted via WC, and D/C home via private auto.  L'ESPERANCE, Camil Hausmann C 07/25/2016 2:01 PM

## 2016-07-25 NOTE — Progress Notes (Addendum)
Patient ID: Stanley FreibergJames C Telford, male   DOB: 06/02/1946, 70 y.o.   MRN: [id number]   Patient Name: Stanley FreibergJames C Miley Date of Encounter: 07/25/2016  [facility]  Hospital Problem List     Active Problems:   Acute appendicitis   Acute appendicitis with localized peritonitis   Chronic combined systolic and diastolic congestive heart failure (HCC)   Ileus, postoperative (HCC)    Subjective   Tolerating regular diet no dyspnea   Inpatient Medications    Scheduled Meds: . apixaban  5 mg Oral BID  . digoxin  125 mcg Oral Daily  . docusate sodium  100 mg Oral BID  . isosorbide mononitrate  30 mg Oral Daily  . metoprolol succinate  50 mg Oral BH-q7a  . metoprolol succinate  75 mg Oral QPM  . piperacillin-tazobactam (ZOSYN)  IV  3.375 g Intravenous Q8H  . rosuvastatin  40 mg Oral q1800  . sacubitril-valsartan  1 tablet Oral BID  . sodium chloride flush  3 mL Intravenous Q12H  . spironolactone  12.5 mg Oral Daily   Continuous Infusions:  PRN Meds:.acetaminophen **OR** acetaminophen, HYDROmorphone (DILAUDID) injection, ondansetron **OR** ondansetron (ZOFRAN) IV, oxyCODONE  Vital Signs    Vitals:   07/24/16 0600 07/24/16 1341 07/24/16 2150 07/25/16 0535  BP: 125/60 (!) 142/79 120/61 129/71  Pulse: 80 78 73 65  Resp: 18 18 18 18   Temp: 98.4 F (36.9 C) 98.4 F (36.9 C) 99.6 F (37.6 C) 98.9 F (37.2 C)  TempSrc:  Oral Oral Oral  SpO2: 97% 95% 98% 99%  Weight: 209 lb 3.2 oz (94.9 kg)     Height:        Intake/Output Summary (Last 24 hours) at 07/25/16 0837 Last data filed at 07/24/16 1829  Gross per 24 hour  Intake             1560 ml  Output                0 ml  Net             1560 ml   Filed Weights   07/21/16 0208 07/22/16 0457 07/24/16 0600  Weight: 223 lb 12.8 oz (101.5 kg) 219 lb 11.2 oz (99.7 kg) 209 lb 3.2 oz (94.9 kg)    Physical Exam    GEN: Well appearing, NAD.  HEENT: Normal Neck: Supple, no JVD, carotid bruits, or masses. Cardiac:  Regular. No M/G/R. No clubbing, cyanosis, edema.  Radials/DP/PT 2+ and equal bilaterally.  Respiratory:  CTAB, normal effort. GI: Mildly distended post lap appy. Non tender.   MS: no deformity or atrophy. Skin: warm and dry, no rash. Neuro:  Strength and sensation are intact. Psych: AAOx3.  Normal affect.  Labs    CBC  Recent Labs  07/24/16 0539 07/25/16 0515  WBC 13.9* 15.0*  HGB 11.9* 11.9*  HCT 36.0* 36.6*  MCV 93.0 92.2  PLT 243 264   Basic Metabolic Panel  Recent Labs  07/24/16 0539 07/25/16 0515  NA 136 136  K 3.5 3.2*  CL 101 101  CO2 27 27  GLUCOSE 134* 134*  BUN 29* 21*  CREATININE 1.43* 1.33*  CALCIUM 8.5* 8.4*   Liver Function Tests No results for input(s): AST, ALT, ALKPHOS, BILITOT, PROT, ALBUMIN in the last 72 hours. No results for input(s): LIPASE, AMYLASE in the last 72 hours.   Telemetry    NSR 07/25/2016  - Personally Reviewed  ECG    Sinus arrhythmia no acute ST changes  -  Personally Reviewed  Radiology    No results found.  Cardiac Studies   Echo 12/17/15 EF 40-45%   Patient Profile     Stanley FreibergJames C Ledger is a 70 y.o. male with a history of CAD s/p CABG (2003), ischemic cardiomyopathy/chronic systolic CHF, and atrial fibrillation [facility] and abdominal pain. Found to have acute appendicitis surgery done 12/17  Assessment & Plan    1. Chronic systolic CHF: EF 16-10%20-25% on TEE from 12/16.  cMRI 2/17 with EF 19%, unable to assess for delayed enhancement on this study.  Ischemic cardiomyopathy.  Low cardiac output by RHC in 1/17 but filling pressures optimized.  Despite low output on RHC, he has been doing quite well with medical treatment.  CPX with only mild functional limitation.  Most recent echo in 5/17 showed improvement in EF to 40-45%, now outside of ICD range. NYHA class II at baseline.   - Volume status.euvolemic    - Continue Entresto 24/26 mg BID.  - Continue spironolactone 12.5 mg daily.   - Continue  Toprol XL 50 qam/75 qpm.   - Continue digoxin. 2. CAD: s/p CABG.  On 1/17 cath, only LIMA-LAD and SVG-OM2 still patent.  I do not think that he would derive much benefit from attempting redo CABG. - No ASA as he has been on Eliquis.  Restart Eliquis when ok per surgery.   3. Atrial fibrillation: Paroxysmal.  Successful DCCV in 3/17 on amiodarone, then amiodarone stopped.   - Remains in NSR currently. - Resume Eliquis 5 mg BID.  OK per surgery. 4. Mitral regurgitation: Severe by 12/16 TEE, probably functional.  Much improved on 5/17 echo, only trivial. 5. CKD: Stage III.  - Creatinine trending down.  Continue to follow.   6. Appendicitis: s/p lap appy.  Abdominal pain resolved.  Ileus resolving still on antibiotics   Charlton HawsPeter Toneisha Savary, MD  07/25/2016, 8:37 AM

## 2016-07-25 NOTE — Progress Notes (Signed)
   Subjective:  Doing well this morning. Reports he was able to eat a full diet last night with no difficulties. Denies any current abdominal pain, nausea/vomiting, fever/chills. Having good bowel movements. Very eager to go home today.   Objective:  Vital signs in last 24 hours: Vitals:   07/24/16 1341 07/24/16 2150 07/25/16 0535 07/25/16 1257  BP: (!) 142/79 120/61 129/71 111/72  Pulse: 78 73 65 63  Resp: 18 18 18 18   Temp: 98.4 F (36.9 C) 99.6 F (37.6 C) 98.9 F (37.2 C) 98.9 F (37.2 C)  TempSrc: Oral Oral Oral Oral  SpO2: 95% 98% 99% 99%  Weight:      Height:       Physical Exam Constitutional: NAD, appears comfortable Cardiovascular:Irregularly irregular rhythm, normal rate, no murmurs, rubs, or gallops.  Pulmonary/Chest:CTAB, no wheezes, rales, or rhonchi.  Abdominal:soft, non-tender, mildly distended but improving, BOS+, incisions clear, dry and intact Extremities: Warm and well perfused. Distal pulses intact. No edema.  Psychiatric:Normal mood and affect  Assessment/Plan:  Acute Appendicitis: S/p laparoscopic appendectomy 12/17 by Dr. Corliss Skainssuei, complicated by post op ileus. NGT pulled 12/20. Tolerated PO last night and this moring. He continues to have BM and pass flatus. Denies N/V. Leukocytosis did worsen today, 13.9 > 15 but is not having any fevers or chills. Vital signs are stable. Has been getting IV Zosyn (day 6) with plans for a course of Augmentin at discharge. Seen by surgery this morning and report he is stable for discharge from their perspective. Will discharge today with 4 day course of Augmentin to finish a 10 day total course.  -- Surgery following, appreciate recommendations  -- D/C IV Zosyn q8 hours (day 6) -- Start Augmentin x 4 days -- D/C home today, follow up with surgery in 2-3 weeks -- Strict return precautions for new fevers, N/V, worsening pain etc - increased risk of abscess with his perforated appendicitis but covered well on  ABX  Atrial Fibrillation/Atrial Flutter:  Persistent a-fib with hx of atrial flutter in 2015 requiring DC cardioversion. Rate controlled on metoprolol, amiodarone, and digoxin. Will resume Eliquis today. This patients CHA2DS2-VASc Score and unadjusted Ischemic Stroke Rate is 3.2 % per year. -- Cardiology following; appreciate recommendations  -- Continue digoxin 125 mcg daily  -- Continue metoprolol 50 mg qAM and 75 mg QHS -- Resume Eliquis 5 mg bid  Chronic Combined Systolic and Diastolic Heart Failure: Secondary to ischemic cardiomyopathy. Follows with Dr. Shirlee LatchMcLean. Currently well compensated and medically optimized.  -- Cardiology following; appreciate recommendations  -- Continue metoprolol and digoxin -- Continue home imdur, spironolactone, and entresto   AKI: Creatine 1.13 on admission. Up to 1.87 after surgery. Has trended back down to 1.33 today.  -- BMET at follow up   HTN: Normotensive. -- Continue meds as above   HLD: -- Continue crestor 40 mg daily   FEN: No fluids,replete lytes prn, clear liquids VTE ppx: Resume Eliquis Code Status: FULL  Dispo: Discharge today  Stanley NoseNathan Karron Alvizo, MD 07/25/2016, 3:14 PM Pager: [phone]

## 2016-07-25 NOTE — Progress Notes (Addendum)
Patient getting frustrated saying "everyone told me I can discharge why can't I go".Dr. Antony ContrasGuilloud paged 2x and text paged sent via amion. Awaiting response. Patient refused IV asking RN to take out. RN explained to patient if MD want's patient to get IV medication patient will need to be restuck for new IV access and patient agreed. Patient stated "if they need to give me something IV then they need to come down here and tell me why I can not leave".   1:26 PM 2nd contact on sticky note paged.   MD called back and placed d/c order 1:59 PM

## 2016-07-25 NOTE — Progress Notes (Signed)
[facility] Progress Note  5 Days Post-Op  Subjective: Sitting up in chair, just ambulated with PT. Tolerating diet, had a BM this AM, pain controlled. Denies fever, chills, SOB, cough, nausea, vomiting, or dysuria. Eager for discharge.  Objective: Vital signs in last 24 hours: Temp:  [98.4 F (36.9 C)-99.6 F (37.6 C)] 98.9 F (37.2 C) (12/22 0535) Pulse Rate:  [65-78] 65 (12/22 0535) Resp:  [18] 18 (12/22 0535) BP: (120-142)/(61-79) 129/71 (12/22 0535) SpO2:  [95 %-99 %] 99 % (12/22 0535) Last BM Date: 07/24/16  Intake/Output from previous day: 12/21 0701 - 12/22 0700 In: 2160 [P.O.:2160] Out: -  Intake/Output this shift: Total I/O In: 480 [P.O.:480] Out: -    PE: Gen:  Alert, NAD, pleasant Pulm: unlabored, CTA, no W/R/R Abd: Soft, appropriately tender, ND, +BS, incisions C/D/I Ext:  No erythema or tenderness, pedal pulses palpable   Lab Results:   Recent Labs  07/24/16 0539 07/25/16 0515  WBC 13.9* 15.0*  HGB 11.9* 11.9*  HCT 36.0* 36.6*  PLT 243 264   CMP     Component Value Date/Time   NA 136 07/25/2016 0515   K 3.2 (L) 07/25/2016 0515   CL 101 07/25/2016 0515   CO2 27 07/25/2016 0515   GLUCOSE 134 (H) 07/25/2016 0515   BUN 21 (H) 07/25/2016 0515   CREATININE 1.33 (H) 07/25/2016 0515   CREATININE 0.90 05/01/2015 0942   CALCIUM 8.4 (L) 07/25/2016 0515   PROT 7.1 07/19/2016 0500   ALBUMIN 4.1 07/19/2016 0500   AST 32 07/19/2016 0500   ALT 36 07/19/2016 0500   ALKPHOS 66 07/19/2016 0500   BILITOT 1.1 07/19/2016 0500   GFRNONAA 53 (L) 07/25/2016 0515   GFRAA >60 07/25/2016 0515   Lipase     Component Value Date/Time   LIPASE 16 07/19/2016 0500   Anti-infectives: Anti-infectives    Start     Dose/Rate Route Frequency Ordered Stop   07/19/16 1200  piperacillin-tazobactam (ZOSYN) IVPB 3.375 g     3.375 g 12.5 mL/hr over 240 Minutes Intravenous Every 8 hours 07/19/16 1126     07/19/16 1100  cefTRIAXone (ROCEPHIN) 2 g in dextrose 5  % 50 mL IVPB  Status:  Discontinued     2 g 100 mL/hr over 30 Minutes Intravenous  Once 07/19/16 1059 07/19/16 1101   07/19/16 1100  Ampicillin-Sulbactam (UNASYN) 3 g in sodium chloride 0.9 % 100 mL IVPB  Status:  Discontinued     3 g 200 mL/hr over 30 Minutes Intravenous Every 6 hours 07/19/16 1059 07/19/16 1101     Assessment/Plan Perforated appendicitis with small appendiceal abscess S/P laparoscopic appendectomy 07/20/16 Dr. Manus RuddMatthew Tsuei  - POD#5, afebrile, VSS - leukocytosis- 15.0 today from 13.9 yesterday - Post-op ileus resolving - NGT d/ced 12/20t, having BM's - Will require 10 days total of abx, switch to PO at discharge - OK to resume Eloquis  FEN: regular diet ID: Zosyn 12/16 >> VTE: SCD's  Plan: stable for discharge from a surgical standpoint with 4 more days PO abx (Augmentin) to complete a 10 day course. Patient to call our office or return to ED with s/s of infection. Patient will follow up in our office in 2-3 weeks.    LOS: 6 days    Adam PhenixElizabeth S Arial Galligan , [facility] 07/25/2016, 10:51 AM Pager: [phone] Consults: [phone] Mon-Fri 7:00 am-4:30 pm Sat-Sun 7:00 am-11:30 am

## 2016-07-25 NOTE — Progress Notes (Addendum)
Occupational Therapy Treatment/Discharge Patient Details Name: Stanley [name]: [id number] DOB: [birth year] Today's Date: 07/25/2016    History of present illness 70 yo male admitted via ED on 07/19/16 with RLQ abdominal pain and was diagnosed with acute appendicitis. Pt underwent appendectomy via laparoscopy on 07/20/16. PMH significant for HTN, HLD, ischemia cardiomyopathy, chronic diastolic and systolic CHF, Atrial Fib, CABG x5 2003.    OT comments  Pt progressing well and has met all OT goals. Pt frustrated with therapy requesting him to participate in ADL and functional mobility this session but agreeable after encouragement. Pt able to tolerate approximately 12 minutes of functional mobility this session with no increase in fatigue. Reinforced education with pt concerning energy conservation strategies. Pt reports understanding but also reporting that he will not need to make any changes when he returns home. Pt has met all OT goals and all education completed. Pt demonstrated independence with ADL transfers this session and had previously demonstrated independence with dressing. OT will sign off.   Follow Up Recommendations  No OT follow up    Equipment Recommendations  None recommended by OT    Recommendations for Other Services      Precautions / Restrictions Precautions Precautions: None Restrictions Weight Bearing Restrictions: No       Mobility Bed Mobility Overal bed mobility: Needs Assistance Bed Mobility: Supine to Sit     Supine to sit: Independent        Transfers Overall transfer level: Independent Equipment used: None Transfers: Sit to/from Stand Sit to Stand: Independent         General transfer comment: Pt continued to be frustrated that therapist asking him to participate in session. However, pt able to complete functional transfers and ADL independently.    Balance Overall balance assessment: No apparent balance deficits (not formally  assessed)                                 ADL Overall ADL's : Needs assistance/impaired                         Toilet Transfer: Independent;BSC   Toileting- Clothing Manipulation and Hygiene: Independent;Sit to/from stand       Functional mobility during ADLs: Independent General ADL Comments: Pt frustrated with therapy still seeing him, reporting that he is fine without therapy. Pt was able to complete toilet transfer, hygeine, and clothing manipulation independently this session. Continued review of energy conservation.      Vision                     Perception     Praxis      Cognition   Behavior During Therapy: WFL for tasks assessed/performed;Agitated Overall Cognitive Status: Within Functional Limits for tasks assessed                       Extremity/Trunk Assessment               Exercises     Shoulder Instructions       General Comments      Pertinent Vitals/ Pain       Pain Assessment: No/denies pain  Home Living  Prior Functioning/Environment              Frequency  Min 2X/week        Progress Toward Goals  OT Goals(current goals can now be found in the care plan section)  Progress towards OT goals: Goals met/education completed, patient discharged from OT  Acute Rehab OT Goals Patient Stated Goal: to get home OT Goal Formulation: With patient Time For Goal Achievement: 08/04/16 Potential to Achieve Goals: Good ADL Goals Pt Will Perform Tub/Shower Transfer: Shower transfer;3 in 1;with modified independence Additional ADL Goal #1: Pt will improve activity tolerance to 15 minutes for standing ADL tasks at sink as a precursor to improved ADL independence.  Plan Discharge plan remains appropriate    Co-evaluation                 End of Session Equipment Utilized During Treatment:  (Pt refused gait belt)   Activity  Tolerance Patient tolerated treatment well   Patient Left in chair;with call bell/phone within reach;with family/visitor present   Nurse Communication          Time: 5498-2641 OT Time Calculation (min): 13 min  Charges: OT General Charges $OT Visit: 1 Procedure OT Treatments $Self Care/Home Management : 8-22 mins  Norman Herrlich, OTR/L [phone] 07/25/2016, 1:06 PM

## 2016-07-25 NOTE — Discharge Summary (Signed)
Name: Stanley [name]: [id number] DOB: 08/06/1945 70 y.o. PCP: Arvin CollardJohn F. Redding II, Wells  Date of Admission: 07/19/2016  4:48 AM Date of Discharge: 07/25/2016 Attending Physician: Doneen PoissonLawrence Klima, Wells  Discharge Diagnosis: Active Problems:   Acute appendicitis   Acute appendicitis with localized peritonitis   Chronic combined systolic and diastolic congestive heart failure (HCC)   Ileus, postoperative [facility](HCC)   Discharge Medications: Allergies as of 07/25/2016   No Known Allergies     Medication List    TAKE these medications   amoxicillin-clavulanate 875-125 MG tablet Commonly known as:  AUGMENTIN Take 1 tablet by mouth 2 (two) times daily. Notes to patient:  antibiotic   DIGITEK 0.125 MG tablet Generic drug:  digoxin take 1 tablet by mouth once daily   ELIQUIS 5 MG Tabs tablet Generic drug:  apixaban Take 5 mg by mouth 2 (two) times daily.   ENTRESTO 24-26 MG Generic drug:  sacubitril-valsartan take 1 tablet by mouth twice a day   furosemide 20 MG tablet Commonly known as:  LASIX Take 2 tablets (40 mg total) by mouth as needed (for 3 lb weight gain over night or 5 lb weight gain in a week).   isosorbide mononitrate 30 MG 24 hr tablet Commonly known as:  IMDUR take 1 tablet by mouth once daily   metoprolol succinate 25 MG 24 hr tablet Commonly known as:  TOPROL-XL Take 2 tabs in AM and 3 tabs in PM   rosuvastatin 40 MG tablet Commonly known as:  CRESTOR take 1 tablet by mouth once daily   spironolactone 25 MG tablet Commonly known as:  ALDACTONE Take 0.5 tablets (12.5 mg total) by mouth daily.       Disposition and follow-up:   Stanley Wells [facility] in St[facility].  At the hospital follow up visit please address:  1.  Acute appendicitis: Underwent laparoscopic appendectomy which demonstrated a perforation of the appendix and associated abscess. Received Zosyn and transitioned to Augmentin at  discharge to complete 10 day course. Post-op complicated with ileus that required NGT decompression. Please assess for bowel function, abdominal pain, fevers/chills, nausea/vomiting and wound healing.   2.  Labs / imaging needed at time of follow-up: CBC  3.  Pending labs/ test needing follow-up: None  Follow-up Appointments: Follow-up Information    [facility]   Specialty:  General Surgery Why:  in 2-3 weeks for post-operative follow up. call to confirm appointment date/time. Contact information: [address] [place] [id number] [phone]       Stanley Wells. Schedule an appointment as soon as possible for a visit in 2 week(s).   Specialty:  Family Medicine Contact information: [address] [place] [id number] [phone]           Hospital Course by problem list:  Acute Appendicitis: Stanley Wells presented with acute onset abdominal pain and came to the ED for evaluation. The pain was associated with nausea but no vomiting, fevers, or diarrhea. In the emergency department he was found to have a leukocytosis of 19.9, a lactic acid of 2.03, and an abdominal and pelvic CT scan that was consistent with acute appendicitis. Because he was on Eliquis for atrial fibrillation anticoagulation his surgery was delayed until the following day and was started on Zosyn. He successfully underwent a laparoscopic appendectomy which demonstrated a perforation of the appendix and associated abscess. He was continued on  Zosyn while in the hospital due to the perforation and abscess. He subsequently developed post op ileus and required NGT and dulcolax suppository. Bowel function returned and he was cleared by surgery for discharge. He was afebrile but white count remained elevated at 15,000. Started Augmentin at discharge to finish a 10 day course. He was given strict return precautions and discharged in stable  condition with follow up with Surgery in 2 weeks.   Atrial Fibrillation: Eliquis was held prior to surgery. Resumed at discharge.   Discharge Vitals:   BP 111/72 (BP Location: Right Arm)   Pulse 63   Temp 98.9 F (37.2 C) (Oral)   Resp 18   Ht 5\' 11"  (1.803 m)   Wt 209 lb 3.2 oz (94.9 kg)   SpO2 99%   BMI 29.18 kg/m   Pertinent Labs, Studies, and Procedures:  CT Abdomen IMPRESSION: Changes of acute appendicitis. Appendicolith noted within the proximal appendix.  Aortoiliac atherosclerosis.  Discharge Instructions: Discharge Instructions    Diet - low sodium heart healthy    Complete by:  As directed    Discharge instructions    Complete by:  As directed    Stanley Wells,  You had acute appendicitis and had your appendix removed. You had some pus around the appendix but localized to the area. I am going to send you home on some antibiotics, Augmentin. Please take them for 4 more days starting tonight until they run out.  If you develop worsening abdominal pain, nausea/vomiting, fevers/chills please return to the emergency department for further evaluation.   Please follow up with the surgeons in 2-3 weeks. Please schedule a follow up appointment with your PCP for 2 weeks as well.   Increase activity slowly    Complete by:  As directed       Signed: Valentino NoseNathan Brenae Lasecki, Wells 07/27/2016, 6:50 PM   Pager: [phone]

## 2016-09-15 ENCOUNTER — Other Ambulatory Visit (HOSPITAL_COMMUNITY): Payer: Self-pay | Admitting: Cardiology

## 2016-10-20 ENCOUNTER — Other Ambulatory Visit: Payer: Self-pay | Admitting: Cardiology

## 2016-11-12 ENCOUNTER — Ambulatory Visit (HOSPITAL_COMMUNITY)
Admission: RE | Admit: 2016-11-12 | Discharge: 2016-11-12 | Disposition: A | Discharge status: Home / Self Care | Payer: Medicare Other | Source: Ambulatory Visit | Attending: Cardiology | Admitting: Cardiology

## 2016-11-12 VITALS — BP 124/72 | HR 52 | Wt 218.5 lb

## 2016-11-12 DIAGNOSIS — Z951 Presence of aortocoronary bypass graft: Secondary | ICD-10-CM | POA: Diagnosis not present

## 2016-11-12 DIAGNOSIS — I255 Ischemic cardiomyopathy: Secondary | ICD-10-CM | POA: Diagnosis not present

## 2016-11-12 DIAGNOSIS — I480 Paroxysmal atrial fibrillation: Secondary | ICD-10-CM | POA: Insufficient documentation

## 2016-11-12 DIAGNOSIS — Z79899 Other long term (current) drug therapy: Secondary | ICD-10-CM | POA: Insufficient documentation

## 2016-11-12 DIAGNOSIS — I2510 Atherosclerotic heart disease of native coronary artery without angina pectoris: Secondary | ICD-10-CM | POA: Insufficient documentation

## 2016-11-12 DIAGNOSIS — Z87891 Personal history of nicotine dependence: Secondary | ICD-10-CM | POA: Diagnosis not present

## 2016-11-12 DIAGNOSIS — G4733 Obstructive sleep apnea (adult) (pediatric): Secondary | ICD-10-CM | POA: Insufficient documentation

## 2016-11-12 DIAGNOSIS — I130 Hypertensive heart and chronic kidney disease with heart failure and stage 1 through stage 4 chronic kidney disease, or unspecified chronic kidney disease: Secondary | ICD-10-CM | POA: Insufficient documentation

## 2016-11-12 DIAGNOSIS — I340 Nonrheumatic mitral (valve) insufficiency: Secondary | ICD-10-CM | POA: Diagnosis not present

## 2016-11-12 DIAGNOSIS — I481 Persistent atrial fibrillation: Secondary | ICD-10-CM

## 2016-11-12 DIAGNOSIS — I5042 Chronic combined systolic (congestive) and diastolic (congestive) heart failure: Secondary | ICD-10-CM | POA: Diagnosis not present

## 2016-11-12 DIAGNOSIS — I4892 Unspecified atrial flutter: Secondary | ICD-10-CM | POA: Insufficient documentation

## 2016-11-12 DIAGNOSIS — I4819 Other persistent atrial fibrillation: Secondary | ICD-10-CM

## 2016-11-12 DIAGNOSIS — N183 Chronic kidney disease, stage 3 (moderate): Secondary | ICD-10-CM | POA: Insufficient documentation

## 2016-11-12 DIAGNOSIS — I252 Old myocardial infarction: Secondary | ICD-10-CM | POA: Insufficient documentation

## 2016-11-12 DIAGNOSIS — I5022 Chronic systolic (congestive) heart failure: Secondary | ICD-10-CM | POA: Insufficient documentation

## 2016-11-12 LAB — BASIC METABOLIC PANEL WITH GFR
Anion gap: 8 (ref 5–15)
BUN: 20 mg/dL (ref 6–20)
CO2: 24 mmol/L (ref 22–32)
Calcium: 9.4 mg/dL (ref 8.9–10.3)
Chloride: 106 mmol/L (ref 101–111)
Creatinine, Ser: 1.03 mg/dL (ref 0.61–1.24)
GFR calc Af Amer: >60 mL/min
GFR calc non Af Amer: >60 mL/min
Glucose, Bld: 110 mg/dL — ABNORMAL HIGH (ref 65–99)
Potassium: 4.6 mmol/L (ref 3.5–5.1)
Sodium: 138 mmol/L (ref 135–145)

## 2016-11-12 LAB — CBC
HCT: 43.1 % (ref 39.0–52.0)
Hemoglobin: 13.9 g/dL (ref 13.0–17.0)
MCH: 29.7 pg (ref 26.0–34.0)
MCHC: 32.3 g/dL (ref 30.0–36.0)
MCV: 92.1 fL (ref 78.0–100.0)
Platelets: 220 K/uL (ref 150–400)
RBC: 4.68 MIL/uL (ref 4.22–5.81)
RDW: 14.9 % (ref 11.5–15.5)
WBC: 10.9 K/uL — ABNORMAL HIGH (ref 4.0–10.5)

## 2016-11-12 LAB — DIGOXIN LEVEL: Digoxin Level: 0.3 ng/mL — ABNORMAL LOW (ref 0.8–2.0)

## 2016-11-12 NOTE — Progress Notes (Signed)
Patient ID: Stanley Wells, male   DOB: 1946/07/17, 71 y.o.   MRN: [id number] PCP: Dr. Jeanie Sewer Cardiology: Dr. Shirlee Latch  71 y.o. with history of CAD, ischemic cardiomyopathy/chronic systolic CHF, severe functional mitral regurgitation, and persistent atrial fibrillation presents for CHF clinic evaluation.  Patient had MI then CABG in 2003.  Cardiac cath in 2015 showed only LIMA-LAD and SVG-OM patent.  In 2015, EF was 30-35%.  He had atrial flutter in 2015 and again in 11/16, both times was cardioverted.  After the 11/16 cardioversion, he stopped all his meds.  He was admitted again in 12/16 with atrial fibrillation/RVR and a CHF exacerbation.  He was diuresed and started on Eliquis with plan for DCCV after 4 weeks of anticoagulation.  He was brought back at the end of December for cardioversion, but this was unsuccessful.  TEE in 12/16 showed EF 20-25% with severe functional MR.    He was seen by Dr [name] with plan for tentative plan for mitral valve repair versus replacement and possible tricuspid valve repair along with MAZE procedure.  He was referred to CHF clinic for optimization prior to surgery.   He went for left and right heart cath. RHC showed good left and right heart filling pressures but cardiac output was low.  Coronary angiography was stable with patent LIMA-LAD and SVG-OM.  Other SVGs occluded.    He had DCCV in 3/17 and is in NSR today on amiodarone.  He had a CPX in 3/17 with only mild functional impairment from CHF.    His most recent echo in 5/17 showed improvement in EF to 40-45% with only trivial MR.    He had appendicitis with appendectomy in 12/17.   Still getting occasional cramping in his buttocks and thighs.  It tends to be momentary but can be quite severe.  Not related to activity.  ABIs were normal.  Stopping Crestor x 2 weeks had no effect on this pain.  No chest pain or exertional dyspnea.  He has not needed Lasix.  No BRBPR or melena. No palpitations.  He is in NSR today.   Weight is down 4 lbs.   Labs (9/16): LDL 126, HDL 38 Labs (12/16): K 4, creatinine 1.15 Labs (1/17): K 4.4, creatinine 1.09, LDL 42, HDL 39 Labs (10/01/2015): K 4.5 Creatinine 1.27 Dig level 0.4  Labs (4/17): digoxin 0.4, LFTs normal, TSH normal Labs (5/17): K 4.5, creatinine 1.36 Labs (6/17): K 5, creatinine 1.88, BNP 88, LDL 54, HDl 39, digoxin 0.8, LFTs normal, TSH normal Labs (9/17): K 4.8, creatinine 1.3, digoxin 0.6, TSH normal, LFTs normal Labs (12/17): K 3.2, creatinine 1.33, digoxin 0.3  PMH: 1. CAD: s/p MI in 2003.  CABG x 5 in 2003 with LIMA-LAD, SVG-D1, SVG-OM2, seq SVG-PDA/PLV.  LHC/RHC 11/15 with native arteries essentially occluded.  SVG-D1 and seq SVG-PDA/PLV occluded, LIMA and SVG-OM patent.  LHC (1/17) with patent LIMA-LAD, occluded SVG-D, patent SVG-OM => SVG touches down on inferior branch of OM1 that backfills to superior branch of OM1, there are serial 70-80% stenoses in the proximal inferior OM1 branch that may compromise backflow to superior branch OM1, SVG-PDA/PLV totally occluded, EF 25% . 2. Atrial flutter: DCCV 2015.  Recurred 11/16 with DCCV.   3. Chronic systolic CHF: Ischemic cardiomyopathy.  Echo (2015) with moderate-severe MR, EF 30-35%.  TEE (12/16) with severe central MR (functional), EF 20-25% with diffuse hypokinesis, moderately decreased RV systolic function, moderate TR.  RHC (1/17) with mean RA 5, PA 27/12 mean 18, mean  PCWP 10, CI 1.7 Fick/1.83 thermo. EF 25% on 1/17 LV-gram.  Cardiac MRI (2/17) with EF 19%, dilated LV, images not adequate to assess delayed enhancement.  - CPX (3/17) with peak VO2 20.4, VE/VCO2 slope 34, RER 1.11 => mild functional impairment.  - Echo (5/17) with EF 40-45%, trivial MR, mild RV dilation with low normal RV systolic function.  4. Mitral regurgitation: Severe on 12/16 TEE, but was only trivial on echo in 5/17 with aggressive medical treatment.  5. OSA: Not using CPAP.  6. HTN 7. Nephrolithiasis 8. Atrial fibrillation: Noted  in 12/16 initially.  Paroxysmal.  Had DCCV to NSR in 3/17.  9. PFTs (1/17) with minimal obstructive airways disease but severely decreased DLCO (37% predicted), possibly suggestive of pulmonary vascular disease.  10. ABIs (5/17) were normal.  11. CKD 12. Appendectomy 12/17.  SH: Prior smoker quit 2013, lives in [place], retired Art gallery manager, married.   FH: No premature CAD.  ROS: All systems reviewed and negative except as per HPI.   Current Outpatient Prescriptions  Medication Sig Dispense Refill  . DIGITEK 125 MCG tablet take 1 tablet by mouth once daily 30 tablet 6  . ELIQUIS 5 MG TABS tablet Take 5 mg by mouth 2 (two) times daily.  1  . ENTRESTO 24-26 MG take 1 tablet by mouth twice a day 60 tablet 6  . furosemide (LASIX) 20 MG tablet Take 2 tablets (40 mg total) by mouth as needed (for 3 lb weight gain over night or 5 lb weight gain in a week). 90 tablet 6  . isosorbide mononitrate (IMDUR) 30 MG 24 hr tablet take 1 tablet by mouth once daily 30 tablet 3  . metoprolol succinate (TOPROL-XL) 25 MG 24 hr tablet Take 2 tabs in AM and 3 tabs in PM 180 tablet 6  . rosuvastatin (CRESTOR) 40 MG tablet take 1 tablet by mouth once daily 30 tablet 6  . spironolactone (ALDACTONE) 25 MG tablet Take 0.5 tablets (12.5 mg total) by mouth daily. 15 tablet 6   No current facility-administered medications for this encounter.    BP 124/72 (BP Location: Right Arm, Patient Position: Sitting, Cuff Size: Normal)   Pulse (!) 52   Wt 218 lb 8 oz (99.1 kg)   SpO2 97%   BMI 30.47 kg/m  General: NAD Neck: JVP not elevated, no thyromegaly or thyroid nodule.  Lungs: Decreased breath sounds bilaterally.  CV: Nondisplaced PMI.  Heart sounds quiet, irregular S1/S2, no S3/S4, no murmur.  No peripheral edema.  No carotid bruit.  Normal peripheral pulses.    Abdomen: Soft, nontender, no hepatosplenomegaly, no distention.  Skin: Intact without lesions or rashes.  Neurologic: Alert and oriented x 3.  Psych:  Normal affect. Extremities: No clubbing or cyanosis.  HEENT: Normal.   Assessment/Plan: 1. Chronic systolic CHF: EF 40-98% on TEE from 12/16.  cMRI 2/17 with EF 19%, unable to assess for delayed enhancement on this study.  Ischemic cardiomyopathy.  Low cardiac output by RHC in 1/17 but filling pressures optimized.  Despite low output on RHC, he has been doing quite well with medical treatment.  CPX with only mild functional limitation.  Most recent echo in 5/17 showed improvement in EF to 40-45%, now outside of ICD range.  NYHA class II.  Volume looks ok on exam today.   - He is now using Lasix only prn.   - Continue current Entresto and spironolactone.  BMET today.   - Continue Toprol XL 50 qam/75 qpm.   -  Continue digoxin, check level.   - Repeat echo in 5/18.  2. CAD: s/p CABG.  On 1/17 cath, only LIMA-LAD and SVG-OM2 still patent. No chest pain. - No ASA given use of apixaban.   - Continue Crestor.  3. Atrial fibrillation: Paroxysmal.  Successful DCCV in 3/17 on amiodarone.  In NSR today.   - He is now off amiodarone.  - Continue apixaban.  CBC today.  4. Mitral regurgitation: Severe by 12/16 TEE, probably functional.  Much improved on 5/17 echo, only trivial.  Will reassess by echo in 5/18.  5. CKD: Stage III. BMET today.   6. Cramps:  In buttocks and posterior thighs. Not related to exertion, normal ABIs in 5/17. Stopping Crestor x 2 weeks did not help.  This has improved in recent months. ?Sciatica.   Follow up 6 months.   Marca Ancona 11/13/2016

## 2016-11-12 NOTE — Patient Instructions (Signed)
Continue current medications  Labs today  Labs in 3 months  Your physician has requested that you have an echocardiogram. Echocardiography is a painless test that uses sound waves to create images of your heart. It provides your doctor with information about the size and shape of your heart and how well your heart's chambers and valves are working. This procedure takes approximately one hour. There are no restrictions for this procedure.  IN MAY  We will contact you in 6 months to schedule your next appointment.

## 2016-12-17 ENCOUNTER — Ambulatory Visit (HOSPITAL_COMMUNITY)
Admission: RE | Admit: 2016-12-17 | Discharge: 2016-12-17 | Disposition: A | Discharge status: Home / Self Care | Payer: Medicare Other | Source: Ambulatory Visit | Attending: Cardiology | Admitting: Cardiology

## 2016-12-17 DIAGNOSIS — I5042 Chronic combined systolic (congestive) and diastolic (congestive) heart failure: Secondary | ICD-10-CM | POA: Diagnosis not present

## 2016-12-17 DIAGNOSIS — I252 Old myocardial infarction: Secondary | ICD-10-CM | POA: Insufficient documentation

## 2016-12-17 DIAGNOSIS — Z87891 Personal history of nicotine dependence: Secondary | ICD-10-CM | POA: Diagnosis not present

## 2016-12-17 DIAGNOSIS — Z951 Presence of aortocoronary bypass graft: Secondary | ICD-10-CM | POA: Diagnosis not present

## 2016-12-17 DIAGNOSIS — I2510 Atherosclerotic heart disease of native coronary artery without angina pectoris: Secondary | ICD-10-CM | POA: Insufficient documentation

## 2016-12-17 DIAGNOSIS — N189 Chronic kidney disease, unspecified: Secondary | ICD-10-CM | POA: Insufficient documentation

## 2016-12-17 DIAGNOSIS — I130 Hypertensive heart and chronic kidney disease with heart failure and stage 1 through stage 4 chronic kidney disease, or unspecified chronic kidney disease: Secondary | ICD-10-CM | POA: Insufficient documentation

## 2016-12-17 DIAGNOSIS — I429 Cardiomyopathy, unspecified: Secondary | ICD-10-CM | POA: Diagnosis not present

## 2016-12-17 NOTE — Progress Notes (Signed)
  Echocardiogram 2D Echocardiogram has been performed.  Leta JunglingCooper, Latesha Chesney M 12/17/2016, 9:27 AM

## 2016-12-22 ENCOUNTER — Other Ambulatory Visit: Payer: Self-pay | Admitting: Cardiovascular Disease

## 2016-12-23 ENCOUNTER — Telehealth (HOSPITAL_COMMUNITY): Payer: Self-pay | Admitting: *Deleted

## 2016-12-23 NOTE — Telephone Encounter (Signed)
ECHOCARDIOGRAM COMPLETE  Order: [id number]  Status:  Final result Visible to patient:  No (Not Released) Dx:  Chronic combined systolic and diastol...  Notes recorded by Suezanne CheshireLewis, Offie Waide S, RN on 12/23/2016 at 4:15 PM EDT Called and spoke with patient's wife, she is aware of results and will let patient know. No further questions at this time. ------  Notes recorded by Laurey MoraleMcLean, Dalton S, MD on 12/18/2016 at 12:47 PM EDT Stable EF 40-45%, only trivial MR

## 2017-01-11 ENCOUNTER — Other Ambulatory Visit (HOSPITAL_COMMUNITY): Payer: Self-pay | Admitting: Cardiology

## 2017-02-07 ENCOUNTER — Other Ambulatory Visit (HOSPITAL_COMMUNITY): Payer: Self-pay | Admitting: Adult Health

## 2017-02-11 ENCOUNTER — Ambulatory Visit (HOSPITAL_COMMUNITY)
Admission: RE | Admit: 2017-02-11 | Discharge: 2017-02-11 | Disposition: A | Discharge status: Home / Self Care | Payer: Medicare Other | Source: Ambulatory Visit | Attending: Internal Medicine | Admitting: Internal Medicine

## 2017-02-11 DIAGNOSIS — I5042 Chronic combined systolic (congestive) and diastolic (congestive) heart failure: Secondary | ICD-10-CM | POA: Diagnosis not present

## 2017-02-11 LAB — BASIC METABOLIC PANEL
ANION GAP: 7 (ref 5–15)
BUN: 22 mg/dL — ABNORMAL HIGH (ref 6–20)
CHLORIDE: 106 mmol/L (ref 101–111)
CO2: 26 mmol/L (ref 22–32)
Calcium: 9.2 mg/dL (ref 8.9–10.3)
Creatinine, Ser: 1.22 mg/dL (ref 0.61–1.24)
GFR calc non Af Amer: 58 mL/min — ABNORMAL LOW (ref >60)
Glucose, Bld: 120 mg/dL — ABNORMAL HIGH (ref 65–99)
POTASSIUM: 4.8 mmol/L (ref 3.5–5.1)
SODIUM: 139 mmol/L (ref 135–145)

## 2017-02-11 LAB — DIGOXIN LEVEL: Digoxin Level: 0.2 ng/mL — ABNORMAL LOW (ref 0.8–2.0)

## 2017-02-14 ENCOUNTER — Other Ambulatory Visit: Payer: Self-pay | Admitting: Internal Medicine

## 2017-04-12 ENCOUNTER — Other Ambulatory Visit (HOSPITAL_COMMUNITY): Payer: Self-pay | Admitting: Internal Medicine

## 2017-04-15 ENCOUNTER — Telehealth (HOSPITAL_COMMUNITY): Payer: Self-pay | Admitting: Pharmacist

## 2017-04-15 NOTE — Telephone Encounter (Signed)
Patient has run out of [employer] grant, have enrolled him in a 2nd grant so that he will have another $800 to use toward his Entresto copays.   Patient Name - Stanley Wells DOB - 04/30/1946 Member ID - [id number] Disease Fund - Heart Failure 2nd grant amount - $800  Total remaining balance - $800  Eligibility End Date - 10/01/2017  Claims Submission End Date - 01/29/2018    Stanley DeisErika K. Bonnye Wells, PharmD, BCPS, CPP Clinical Pharmacist Pager: [phone] Phone: [phone] 04/15/2017 9:47 AM

## 2017-05-14 ENCOUNTER — Ambulatory Visit (HOSPITAL_COMMUNITY)
Admission: RE | Admit: 2017-05-14 | Discharge: 2017-05-14 | Disposition: A | Discharge status: Home / Self Care | Payer: Medicare Other | Source: Ambulatory Visit | Attending: Cardiology | Admitting: Cardiology

## 2017-05-14 ENCOUNTER — Encounter (HOSPITAL_COMMUNITY): Payer: Self-pay | Admitting: Cardiology

## 2017-05-14 VITALS — BP 132/85 | HR 50 | Wt 215.5 lb

## 2017-05-14 DIAGNOSIS — E785 Hyperlipidemia, unspecified: Secondary | ICD-10-CM

## 2017-05-14 DIAGNOSIS — Z79899 Other long term (current) drug therapy: Secondary | ICD-10-CM | POA: Diagnosis not present

## 2017-05-14 DIAGNOSIS — I2510 Atherosclerotic heart disease of native coronary artery without angina pectoris: Secondary | ICD-10-CM | POA: Insufficient documentation

## 2017-05-14 DIAGNOSIS — I480 Paroxysmal atrial fibrillation: Secondary | ICD-10-CM | POA: Insufficient documentation

## 2017-05-14 DIAGNOSIS — R252 Cramp and spasm: Secondary | ICD-10-CM | POA: Insufficient documentation

## 2017-05-14 DIAGNOSIS — Z87891 Personal history of nicotine dependence: Secondary | ICD-10-CM | POA: Insufficient documentation

## 2017-05-14 DIAGNOSIS — I5042 Chronic combined systolic (congestive) and diastolic (congestive) heart failure: Secondary | ICD-10-CM | POA: Diagnosis not present

## 2017-05-14 DIAGNOSIS — I4891 Unspecified atrial fibrillation: Secondary | ICD-10-CM | POA: Diagnosis not present

## 2017-05-14 DIAGNOSIS — I130 Hypertensive heart and chronic kidney disease with heart failure and stage 1 through stage 4 chronic kidney disease, or unspecified chronic kidney disease: Secondary | ICD-10-CM | POA: Diagnosis not present

## 2017-05-14 DIAGNOSIS — I5022 Chronic systolic (congestive) heart failure: Secondary | ICD-10-CM | POA: Diagnosis not present

## 2017-05-14 DIAGNOSIS — I340 Nonrheumatic mitral (valve) insufficiency: Secondary | ICD-10-CM | POA: Diagnosis not present

## 2017-05-14 LAB — LIPID PANEL
Cholesterol: 128 mg/dL (ref 0–200)
HDL: 39 mg/dL — ABNORMAL LOW (ref >40)
LDL CALC: 41 mg/dL (ref 0–99)
TRIGLYCERIDES: 240 mg/dL — AB (ref <150)
Total CHOL/HDL Ratio: 3.3 RATIO
VLDL: 48 mg/dL — ABNORMAL HIGH (ref 0–40)

## 2017-05-14 LAB — BASIC METABOLIC PANEL
ANION GAP: 7 (ref 5–15)
BUN: 17 mg/dL (ref 6–20)
CALCIUM: 9.5 mg/dL (ref 8.9–10.3)
CO2: 23 mmol/L (ref 22–32)
Chloride: 105 mmol/L (ref 101–111)
Creatinine, Ser: 1.06 mg/dL (ref 0.61–1.24)
Glucose, Bld: 113 mg/dL — ABNORMAL HIGH (ref 65–99)
Potassium: 4.6 mmol/L (ref 3.5–5.1)
SODIUM: 135 mmol/L (ref 135–145)

## 2017-05-14 LAB — DIGOXIN LEVEL: DIGOXIN LVL: 0.3 ng/mL — AB (ref 0.8–2.0)

## 2017-05-14 MED ORDER — SPIRONOLACTONE 25 MG PO TABS: 25.0000 mg | ORAL_TABLET | Freq: Every day | ORAL | 6 refills | Status: DC

## 2017-05-14 NOTE — Patient Instructions (Signed)
Increase Spironalactone 25 mg (1 tab) daily  Labs drawn today (if we do not call you, then your lab work was stable)   Your physician recommends that you return for lab work in: 10 days  Your physician recommends that you schedule a follow-up appointment in: 6 months

## 2017-05-14 NOTE — Progress Notes (Signed)
Patient ID: Stanley Wells, male   DOB: June 07, 1946, 71 y.o.   MRN: [id number] PCP: Dr. Jeanie Sewer Cardiology: Dr. Shirlee Latch  71 yo with history of CAD, ischemic cardiomyopathy/chronic systolic CHF, severe functional mitral regurgitation, and persistent atrial fibrillation presents for followup of CHF and mitral regurgitation.  Patient had MI then CABG in 2003.  Cardiac cath in 2015 showed only LIMA-LAD and SVG-OM patent.  In 2015, EF was 30-35%.  He had atrial flutter in 2015 and again in 11/16, both times was cardioverted.  After the 11/16 cardioversion, he stopped all his meds.  He was admitted again in 12/16 with atrial fibrillation/RVR and a CHF exacerbation.  He was diuresed and started on Eliquis with plan for DCCV after 4 weeks of anticoagulation.  He was brought back at the end of December for cardioversion, but this was unsuccessful.  TEE in 12/16 showed EF 20-25% with severe functional MR.    He was seen by Dr [name] with plan for tentative plan for mitral valve repair versus replacement and possible tricuspid valve repair along with MAZE procedure.  He was referred to CHF clinic for optimization prior to surgery.   He went for left and right heart cath. RHC showed good left and right heart filling pressures but cardiac output was low.  Coronary angiography was stable with patent LIMA-LAD and SVG-OM.  Other SVGs occluded.    He had DCCV in 3/17 and is in NSR today on amiodarone.  He had a CPX in 3/17 with only mild functional impairment from CHF.    He had appendicitis with appendectomy in 12/17.   His most recent echo in 5/18 showed EF 40-45% (stable) with trivial MR.    Still getting occasional cramping in his buttocks and thighs. Not as bad as in the past.  Not related to activity.  ABIs were normal.  Stopping Crestor x 2 weeks had no effect on this pain.  No significant exertional dyspnea.  Able to do all his yardwork.  No chest pain. No orthopnea/PND.  No BRBPR/melena.    ECG (personally  reviewed): NSR, PACs  Labs (9/16): LDL 126, HDL 38 Labs (12/16): K 4, creatinine 1.15 Labs (1/17): K 4.4, creatinine 1.09, LDL 42, HDL 39 Labs (10/01/2015): K 4.5 Creatinine 1.27 Dig level 0.4  Labs (4/17): digoxin 0.4, LFTs normal, TSH normal Labs (5/17): K 4.5, creatinine 1.36 Labs (6/17): K 5, creatinine 1.88, BNP 88, LDL 54, HDl 39, digoxin 0.8, LFTs normal, TSH normal Labs (9/17): K 4.8, creatinine 1.3, digoxin 0.6, TSH normal, LFTs normal Labs (12/17): K 3.2, creatinine 1.33, digoxin 0.3 Labs (7/18): K 4.8, digoxin 0.2, creatinine 1.22  PMH: 1. CAD: s/p MI in 2003.  CABG x 5 in 2003 with LIMA-LAD, SVG-D1, SVG-OM2, seq SVG-PDA/PLV.  LHC/RHC 11/15 with native arteries essentially occluded.  SVG-D1 and seq SVG-PDA/PLV occluded, LIMA and SVG-OM patent.  LHC (1/17) with patent LIMA-LAD, occluded SVG-D, patent SVG-OM => SVG touches down on inferior branch of OM1 that backfills to superior branch of OM1, there are serial 70-80% stenoses in the proximal inferior OM1 branch that may compromise backflow to superior branch OM1, SVG-PDA/PLV totally occluded, EF 25% . 2. Atrial flutter: DCCV 2015.  Recurred 11/16 with DCCV.   3. Chronic systolic CHF: Ischemic cardiomyopathy.  Echo (2015) with moderate-severe MR, EF 30-35%.  TEE (12/16) with severe central MR (functional), EF 20-25% with diffuse hypokinesis, moderately decreased RV systolic function, moderate TR.  RHC (1/17) with mean RA 5, PA 27/12 mean 18, mean  PCWP 10, CI 1.7 Fick/1.83 thermo. EF 25% on 1/17 LV-gram.  Cardiac MRI (2/17) with EF 19%, dilated LV, images not adequate to assess delayed enhancement.  - CPX (3/17) with peak VO2 20.4, VE/VCO2 slope 34, RER 1.11 => mild functional impairment.  - Echo (5/17) with EF 40-45%, trivial MR, mild RV dilation with low normal RV systolic function.  - Echo (5/18) with EF 40-45%, basal to mid inferior HK, moderate LVH, trivial MR.  4. Mitral regurgitation: Severe on 12/16 TEE, but was only trivial on  echo in 5/17 and 5/18 with aggressive medical treatment.  5. OSA: Not using CPAP.  6. HTN 7. Nephrolithiasis 8. Atrial fibrillation: Noted in 12/16 initially.  Paroxysmal.  Had DCCV to NSR in 3/17.  9. PFTs (1/17) with minimal obstructive airways disease but severely decreased DLCO (37% predicted), possibly suggestive of pulmonary vascular disease.  10. ABIs (5/17) were normal.  11. CKD 12. Appendectomy 12/17.  SH: Prior smoker quit 2013, lives in [place], retired Art gallery manager, married.   FH: No premature CAD.  ROS: All systems reviewed and negative except as per HPI.   Current Outpatient Prescriptions  Medication Sig Dispense Refill  . DIGITEK 125 MCG tablet take 1 tablet by mouth once daily 30 tablet 6  . ELIQUIS 5 MG TABS tablet take 1 tablet by mouth twice a day 60 tablet 5  . ENTRESTO 24-26 MG take 1 tablet by mouth twice a day 60 tablet 6  . furosemide (LASIX) 20 MG tablet Take 2 tablets (40 mg total) by mouth as needed (for 3 lb weight gain over night or 5 lb weight gain in a week). 90 tablet 6  . isosorbide mononitrate (IMDUR) 30 MG 24 hr tablet take 1 tablet by mouth once daily 30 tablet 3  . metoprolol succinate (TOPROL-XL) 25 MG 24 hr tablet Take 2 tabs in AM and 3 tabs in PM 180 tablet 6  . rosuvastatin (CRESTOR) 40 MG tablet take 1 tablet by mouth once daily 90 tablet 3  . spironolactone (ALDACTONE) 25 MG tablet Take 1 tablet (25 mg total) by mouth daily. 30 tablet 6   No current facility-administered medications for this encounter.    BP 132/85 (BP Location: Right Arm, Patient Position: Sitting, Cuff Size: Normal)   Pulse (!) 50   Wt 215 lb 8 oz (97.8 kg)   SpO2 98%   BMI 30.06 kg/m  General: NAD Neck: No JVD, no thyromegaly or thyroid nodule.  Lungs: Clear to auscultation bilaterally with normal respiratory effort. CV: Nondisplaced PMI.  Heart regular S1/S2, no S3/S4, no murmur.  No peripheral edema.  No carotid bruit.  Normal pedal pulses.  Abdomen: Soft,  nontender, no hepatosplenomegaly, no distention.  Skin: Intact without lesions or rashes.  Neurologic: Alert and oriented x 3.  Psych: Normal affect. Extremities: No clubbing or cyanosis.  HEENT: Normal.   Assessment/Plan: 1. Chronic systolic CHF: EF 11-91% on TEE from 12/16.  cMRI 2/17 with EF 19%, unable to assess for delayed enhancement on this study.  Ischemic cardiomyopathy.  Low cardiac output by RHC in 1/17 but filling pressures optimized.  Despite low output on that RHC, he has been doing quite well with medical treatment.  CPX (3/17) with only mild functional limitation.  Most recent echo in 5/18 showed stable EF 40-45%, now outside of ICD range.  NYHA class I-II.  Volume looks ok on exam today.   - He is now using Lasix only prn.   - Continue current Entresto and  Toprol XL. - Continue digoxin, check level.   - Increase spironolactone to 25 mg daily.  Check BMET today and again in 10 days.    2. CAD: s/p CABG.  On 1/17 cath, only LIMA-LAD and SVG-OM2 still patent. No chest pain. - No ASA given use of apixaban.   - Continue Crestor.  3. Atrial fibrillation: Paroxysmal.  Successful DCCV in 3/17 on amiodarone.  In NSR on ECG today.   - He is now off amiodarone.  - Continue apixaban.   4. Mitral regurgitation: Severe by 12/16 TEE, probably functional.  Much improved on 5/17 echo, only trivial.  Trivial on 5/18 echo.  5. CKD: Stage III. BMET today.   6. Cramps:  In buttocks and posterior thighs. Not related to exertion, normal ABIs in 5/17. Stopping Crestor x 2 weeks did not help.  This has improved in recent months. ?Sciatica.   Follow up 6 months.   Stanley Wells 05/14/2017

## 2017-05-23 ENCOUNTER — Other Ambulatory Visit: Payer: Self-pay | Admitting: Internal Medicine

## 2017-05-25 ENCOUNTER — Ambulatory Visit (HOSPITAL_COMMUNITY)
Admission: RE | Admit: 2017-05-25 | Discharge: 2017-05-25 | Disposition: A | Discharge status: Home / Self Care | Payer: Medicare Other | Source: Ambulatory Visit | Attending: Cardiology | Admitting: Cardiology

## 2017-05-25 DIAGNOSIS — I5042 Chronic combined systolic (congestive) and diastolic (congestive) heart failure: Secondary | ICD-10-CM | POA: Diagnosis not present

## 2017-05-25 LAB — BASIC METABOLIC PANEL
Anion gap: 8 (ref 5–15)
BUN: 16 mg/dL (ref 6–20)
CHLORIDE: 102 mmol/L (ref 101–111)
CO2: 26 mmol/L (ref 22–32)
Calcium: 9.3 mg/dL (ref 8.9–10.3)
Creatinine, Ser: 1.01 mg/dL (ref 0.61–1.24)
GFR calc non Af Amer: >60 mL/min (ref >60)
Glucose, Bld: 124 mg/dL — ABNORMAL HIGH (ref 65–99)
POTASSIUM: 4.6 mmol/L (ref 3.5–5.1)
SODIUM: 136 mmol/L (ref 135–145)

## 2017-06-22 ENCOUNTER — Other Ambulatory Visit (HOSPITAL_COMMUNITY): Payer: Self-pay | Admitting: *Deleted

## 2017-06-22 MED ORDER — ISOSORBIDE MONONITRATE ER 30 MG PO TB24: 30.0000 mg | ORAL_TABLET | Freq: Every day | ORAL | 3 refills | Status: DC

## 2017-06-22 MED ORDER — APIXABAN 5 MG PO TABS: 5.0000 mg | ORAL_TABLET | Freq: Two times a day (BID) | ORAL | 5 refills | Status: DC

## 2017-08-28 DIAGNOSIS — J029 Acute pharyngitis, unspecified: Secondary | ICD-10-CM | POA: Diagnosis not present

## 2017-08-28 DIAGNOSIS — Z6831 Body mass index (BMI) 31.0-31.9, adult: Secondary | ICD-10-CM | POA: Diagnosis not present

## 2017-10-09 ENCOUNTER — Emergency Department (HOSPITAL_COMMUNITY)
Admission: EM | Admit: 2017-10-09 | Discharge: 2017-10-10 | Disposition: A | Discharge status: Home / Self Care | Payer: Medicare Other | Attending: Emergency Medicine | Admitting: Emergency Medicine

## 2017-10-09 ENCOUNTER — Emergency Department (HOSPITAL_COMMUNITY): Payer: Medicare Other

## 2017-10-09 ENCOUNTER — Encounter (HOSPITAL_COMMUNITY): Payer: Self-pay | Admitting: Emergency Medicine

## 2017-10-09 ENCOUNTER — Other Ambulatory Visit: Payer: Self-pay

## 2017-10-09 DIAGNOSIS — N23 Unspecified renal colic: Secondary | ICD-10-CM | POA: Insufficient documentation

## 2017-10-09 DIAGNOSIS — I4891 Unspecified atrial fibrillation: Secondary | ICD-10-CM | POA: Insufficient documentation

## 2017-10-09 DIAGNOSIS — I110 Hypertensive heart disease with heart failure: Secondary | ICD-10-CM | POA: Insufficient documentation

## 2017-10-09 DIAGNOSIS — I5042 Chronic combined systolic (congestive) and diastolic (congestive) heart failure: Secondary | ICD-10-CM | POA: Diagnosis not present

## 2017-10-09 DIAGNOSIS — R1084 Generalized abdominal pain: Secondary | ICD-10-CM | POA: Diagnosis present

## 2017-10-09 DIAGNOSIS — Z951 Presence of aortocoronary bypass graft: Secondary | ICD-10-CM | POA: Diagnosis not present

## 2017-10-09 DIAGNOSIS — I2510 Atherosclerotic heart disease of native coronary artery without angina pectoris: Secondary | ICD-10-CM | POA: Diagnosis not present

## 2017-10-09 DIAGNOSIS — N132 Hydronephrosis with renal and ureteral calculous obstruction: Secondary | ICD-10-CM | POA: Diagnosis not present

## 2017-10-09 LAB — CBC WITH DIFFERENTIAL/PLATELET
BASOS ABS: 0 10*3/uL (ref 0.0–0.1)
BASOS PCT: 0 %
Eosinophils Absolute: 0.1 10*3/uL (ref 0.0–0.7)
Eosinophils Relative: 0 %
HCT: 43.3 % (ref 39.0–52.0)
Hemoglobin: 13.9 g/dL (ref 13.0–17.0)
Lymphocytes Relative: 10 %
Lymphs Abs: 1.9 10*3/uL (ref 0.7–4.0)
MCH: 29.9 pg (ref 26.0–34.0)
MCHC: 32.1 g/dL (ref 30.0–36.0)
MCV: 93.1 fL (ref 78.0–100.0)
MONO ABS: 0.7 10*3/uL (ref 0.1–1.0)
Monocytes Relative: 4 %
Neutro Abs: 16.1 10*3/uL — ABNORMAL HIGH (ref 1.7–7.7)
Neutrophils Relative %: 86 %
PLATELETS: 214 10*3/uL (ref 150–400)
RBC: 4.65 MIL/uL (ref 4.22–5.81)
RDW: 15.3 % (ref 11.5–15.5)
WBC: 18.7 10*3/uL — ABNORMAL HIGH (ref 4.0–10.5)

## 2017-10-09 LAB — COMPREHENSIVE METABOLIC PANEL
ALBUMIN: 4.3 g/dL (ref 3.5–5.0)
ALT: 24 U/L (ref 17–63)
AST: 25 U/L (ref 15–41)
Alkaline Phosphatase: 62 U/L (ref 38–126)
Anion gap: 9 (ref 5–15)
BILIRUBIN TOTAL: 0.9 mg/dL (ref 0.3–1.2)
BUN: 24 mg/dL — AB (ref 6–20)
CO2: 22 mmol/L (ref 22–32)
Calcium: 9.4 mg/dL (ref 8.9–10.3)
Chloride: 105 mmol/L (ref 101–111)
Creatinine, Ser: 1.56 mg/dL — ABNORMAL HIGH (ref 0.61–1.24)
GFR calc Af Amer: 50 mL/min — ABNORMAL LOW (ref >60)
GFR calc non Af Amer: 43 mL/min — ABNORMAL LOW (ref >60)
GLUCOSE: 152 mg/dL — AB (ref 65–99)
POTASSIUM: 4.5 mmol/L (ref 3.5–5.1)
Sodium: 136 mmol/L (ref 135–145)
TOTAL PROTEIN: 7.2 g/dL (ref 6.5–8.1)

## 2017-10-09 LAB — URINALYSIS, ROUTINE W REFLEX MICROSCOPIC
Bacteria, UA: NONE SEEN
Bilirubin Urine: NEGATIVE
Glucose, UA: NEGATIVE mg/dL
KETONES UR: NEGATIVE mg/dL
Leukocytes, UA: NEGATIVE
Nitrite: NEGATIVE
Protein, ur: NEGATIVE mg/dL
SPECIFIC GRAVITY, URINE: 1.018 (ref 1.005–1.030)
Squamous Epithelial / LPF: NONE SEEN
pH: 5 (ref 5.0–8.0)

## 2017-10-09 LAB — LIPASE, BLOOD: Lipase: 25 U/L (ref 11–51)

## 2017-10-09 MED ORDER — KETOROLAC TROMETHAMINE 15 MG/ML IJ SOLN
15.0000 mg | Freq: Once | INTRAMUSCULAR | Status: AC
  Administered 2017-10-09: 15 mg via INTRAVENOUS
  Filled 2017-10-09: qty 1

## 2017-10-09 MED ORDER — HYDROCODONE-ACETAMINOPHEN 5-325 MG PO TABS: 2.0000 | ORAL_TABLET | ORAL | 0 refills | Status: DC | PRN

## 2017-10-09 MED ORDER — SODIUM CHLORIDE 0.9 % IV BOLUS (SEPSIS)
500.0000 mL | Freq: Once | INTRAVENOUS | Status: AC
  Administered 2017-10-09: 500 mL via INTRAVENOUS

## 2017-10-09 MED ORDER — ONDANSETRON HCL 4 MG PO TABS: 4.0000 mg | ORAL_TABLET | Freq: Three times a day (TID) | ORAL | 0 refills | Status: DC | PRN

## 2017-10-09 MED ORDER — MORPHINE SULFATE (PF) 4 MG/ML IV SOLN
2.0000 mg | Freq: Once | INTRAVENOUS | Status: AC
  Administered 2017-10-09: 2 mg via INTRAVENOUS
  Filled 2017-10-09: qty 1

## 2017-10-09 MED ORDER — ONDANSETRON HCL 4 MG/2ML IJ SOLN
4.0000 mg | Freq: Once | INTRAMUSCULAR | Status: AC
  Administered 2017-10-09: 4 mg via INTRAVENOUS
  Filled 2017-10-09: qty 2

## 2017-10-09 NOTE — ED Provider Notes (Signed)
[facility]   CSN: [id number] Arrival date & time: 10/09/17  2102     History   Chief Complaint Chief Complaint  Patient presents with  . Flank Pain    HPI Stanley Wells is a 72 y.o. male.  72 year old male with prior history of renal colic, CAD, cardiomyopathy, A. fib, and OSA presents with complaint of right flank pain.  Reports that around lunch he started to develop right flank pain.  Pain was initially more in the right back and then over the course of the last few hours has moved to the right lower quadrant.  Pain is consistent with patient's prior episode of renal colic -proximately 20 years ago.  Patient reports pain and mild associated nausea.  He denies fever.  He denies bowel movement change.  He does report seeing a "reddish tinge" to his urine earlier today.   The history is provided by the patient.  Flank Pain  This is a new problem. The current episode started 6 to 12 hours ago. The problem occurs rarely. The problem has not changed since onset.Pertinent negatives include no chest pain and no abdominal pain. Nothing aggravates the symptoms. Nothing relieves the symptoms. He has tried nothing for the symptoms. The treatment provided no relief.    Past Medical History:  Diagnosis Date  . CAD (coronary artery disease)    a. s/p CABG 2003  b. s/p acute MI prior to CABG w/ EF <30%  . Cardiomyopathy, ischemic: EF 20-25%   . Cholecystitis   . Chronic combined systolic and diastolic CHF (congestive heart failure) (HCC)   . Chronic systolic CHF (congestive heart failure) (HCC)    a. 2D ECHO: 05/17/2014: EF 30-35%;  b. 07/2015 TEE: EF 20-25%, diff HK, sev MR, sev dil LA w/o thrombus, mod reduced RV fxn, mod dil RA, mod TR.  Marland Kitchen. Hyperlipidemia   . Hypertensive heart disease   . Kidney stones X 1   "passed it" (08/02/2015)  . OSA (obstructive sleep apnea) 10/11/2014   refuses to use CPAP  . Persistent atrial fibrillation  (HCC)    a. s/p succesful TEE/DCCV on 06/01/14.  b. on Eliquis;  c. 07/2015 recurrent AF-->Failed DCCV.  . S/P CABG x 5 02/16/2002   LIMA to LAD, SVG to D1, SVG to OM2, Sequential SVG to PDA-RPL, saphenous vein harvest from right thigh and lower leg  . Severe mitral regurgitation   . Tricuspid regurgitation     Patient Active Problem List   Diagnosis Date Noted  . Ileus, postoperative (HCC)   . Acute appendicitis with localized peritonitis   . Chronic combined systolic and diastolic congestive heart failure (HCC)   . Acute appendicitis 07/19/2016  . Tricuspid regurgitation   . Severe mitral regurgitation 08/04/2015  . Hyperlipidemia   . Hypertensive heart disease   . Atrial fibrillation (HCC) 08/02/2015  . A-fib (HCC) 08/02/2015  . Persistent atrial fibrillation (HCC) 08/02/2015  . Chronic anticoagulation - Eliquis, CHADS2VASC=4 07/23/2015  . Acute on chronic combined systolic and diastolic heart failure, NYHA class 2 (HCC) 07/13/2015  . Acute on chronic combined systolic and diastolic congestive heart failure, NYHA class 1 (HCC) 07/13/2015  . Mitral regurgitation 07/07/2015  . Elevated troponin 07/07/2015  . Acute kidney injury (HCC) 07/07/2015  . Hypotension 07/07/2015  . Leukocytosis 07/07/2015  . Noncompliance with medication regimen 07/05/2015  . Long term current use of anticoagulant therapy - Eliquis 06/11/2015  . Essential hypertension 05/01/2015  . OSA (obstructive  sleep apnea) 10/11/2014  . Cardiomyopathy, ischemic: EF 20-25% 07/06/2014  . Chronic combined systolic and diastolic CHF (congestive heart failure) (HCC)   . Coronary artery disease involving native coronary artery without angina pectoris   . Cholecystitis   . S/P CABG x 5 02/16/2002    Past Surgical History:  Procedure Laterality Date  . CARDIAC CATHETERIZATION N/A 06/19/2014   Procedure: RIGHT/LEFT HEART CATH AND CORONARY/GRAFT ANGIOGRAPHY;  Surgeon: Lennette Bihari, MD;  Location: [facility] CATH LAB;  Service:  Cardiovascular;  Laterality: N/A;  . CARDIAC CATHETERIZATION  2003  . CARDIAC CATHETERIZATION N/A 08/28/2015   Procedure: Right Heart Cath and Coronary/Graft Angiography;  Surgeon: Laurey Morale, MD;  Location: [facility] INVASIVE CV LAB;  Service: Cardiovascular;  Laterality: N/A;  . CARDIOVERSION N/A 06/01/2014   Procedure: CARDIOVERSION;  Surgeon: Lars Masson, MD;  Location: [facility] ENDOSCOPY;  Service: Cardiovascular;  Laterality: N/A;  . CARDIOVERSION  06/11/2015; 08/02/2015  . CARDIOVERSION N/A 08/02/2015   Procedure: CARDIOVERSION;  Surgeon: Wendall Stade, MD;  Location: [facility] ENDOSCOPY;  Service: Cardiovascular;  Laterality: N/A;  . CARDIOVERSION N/A 10/22/2015   Procedure: CARDIOVERSION;  Surgeon: Laurey Morale, MD;  Location: [facility] ENDOSCOPY;  Service: Cardiovascular;  Laterality: N/A;  . CORONARY ARTERY BYPASS GRAFT  02/16/2002   CABG X5  . LAPAROSCOPIC APPENDECTOMY N/A 07/20/2016   Procedure: APPENDECTOMY LAPAROSCOPIC;  Surgeon: Manus Rudd, MD;  Location: [facility] OR;  Service: General;  Laterality: N/A;  . TEE WITHOUT CARDIOVERSION N/A 06/01/2014   Procedure: TRANSESOPHAGEAL ECHOCARDIOGRAM (TEE);  Surgeon: Lars Masson, MD;  Location: [facility] ENDOSCOPY;  Service: Cardiovascular;  Laterality: N/A;  . TEE WITHOUT CARDIOVERSION N/A 08/03/2015   Procedure: TRANSESOPHAGEAL ECHOCARDIOGRAM (TEE);  Surgeon: Lars Masson, MD;  Location: [facility] ENDOSCOPY;  Service: Cardiovascular;  Laterality: N/A;       Home Medications    Prior to Admission medications   Medication Sig Start Date End Date Taking? Authorizing Provider  apixaban (ELIQUIS) 5 MG TABS tablet Take 1 tablet (5 mg total) 2 (two) times daily by mouth. 06/22/17  Yes Laurey Morale, MD  DIGITEK 125 MCG tablet take 1 tablet by mouth once daily 05/27/17  Yes Bensimhon, Bevelyn Buckles, MD  ENTRESTO 24-26 MG take 1 tablet by mouth twice a day 04/14/17  Yes Bensimhon, Bevelyn Buckles, MD  furosemide (LASIX) 20 MG tablet Take 2 tablets (40 mg total) by  mouth as needed (for 3 lb weight gain over night or 5 lb weight gain in a week). 01/10/16  Yes Clegg, Amy D, NP  isosorbide mononitrate (IMDUR) 30 MG 24 hr tablet Take 1 tablet (30 mg total) daily by mouth. 06/22/17  Yes Laurey Morale, MD  metoprolol succinate (TOPROL-XL) 25 MG 24 hr tablet Take 2 tabs in AM and 3 tabs in PM Patient taking differently: Take 50-75 mg by mouth See admin instructions. Take twice a day Take 50 mg in the morning and 75 mg in the evening 04/11/16  Yes Laurey Morale, MD  rosuvastatin (CRESTOR) 40 MG tablet take 1 tablet by mouth once daily Patient taking differently: take 1 tablet by mouth in the evening 01/13/17  Yes Bensimhon, Bevelyn Buckles, MD  spironolactone (ALDACTONE) 25 MG tablet Take 1 tablet (25 mg total) by mouth daily. 05/14/17  Yes Laurey Morale, MD  metoprolol succinate (TOPROL-XL) 25 MG 24 hr tablet 50 mg (2 tabs) in the AM and 75 mg (3 tabs) in the PM Patient not taking: Reported on 10/09/2017 05/27/17   Bensimhon,  Bevelyn Buckles, MD  amLODipine (NORVASC) 5 MG tablet Take 1 tablet (5 mg total) by mouth daily. 05/01/15 06/11/15  Lennette Bihari, MD    Family History Family History  Problem Relation Age of Onset  . Cancer Mother   . Cancer Father   . Hypertension Neg Hx   . Stroke Neg Hx     Social History Social History   Tobacco Use  . Smoking status: Former Smoker    Packs/day: 1.00    Years: 40.00    Pack years: 40.00    Types: Cigarettes    Last attempt to quit: 02/02/2002    Years since quitting: 15.6  . Smokeless tobacco: Never Used  Substance Use Topics  . Alcohol use: Yes    Comment: 08/02/2015 "I rarely have a beer"  . Drug use: No     Allergies   Patient has no known allergies.   Review of Systems Review of Systems  Cardiovascular: Negative for chest pain.  Gastrointestinal: Negative for abdominal pain.  Genitourinary: Positive for flank pain.  All other systems reviewed and are negative.    Physical Exam Updated Vital  Signs BP (!) 168/103   Pulse 64   Temp 98.2 F (36.8 C) (Oral)   Resp 16   Ht 5\' 11"  (1.803 m)   Wt 94.3 kg (208 lb)   SpO2 97%   BMI 29.01 kg/m   Physical Exam  Constitutional: He is oriented to person, place, and time. He appears well-developed and well-nourished. No distress.  HENT:  Head: Normocephalic and atraumatic.  Mouth/Throat: Oropharynx is clear and moist.  Eyes: Conjunctivae and EOM are normal. Pupils are equal, round, and reactive to light.  Neck: Normal range of motion. Neck supple.  Cardiovascular: Normal rate, regular rhythm and normal heart sounds.  Pulmonary/Chest: Effort normal and breath sounds normal. No respiratory distress.  Abdominal: Soft. He exhibits no distension. There is no tenderness.  Genitourinary:  Genitourinary Comments: No CVAT   Musculoskeletal: Normal range of motion. He exhibits no edema or deformity.  Neurological: He is alert and oriented to person, place, and time.  Skin: Skin is warm and dry.  Psychiatric: He has a normal mood and affect.  Nursing note and vitals reviewed.    ED Treatments / Results  Labs (all labs ordered are listed, but only abnormal results are displayed) Labs Reviewed  URINALYSIS, ROUTINE W REFLEX MICROSCOPIC - Abnormal; Notable for the following components:      Result Value   APPearance HAZY (*)    Hgb urine dipstick LARGE (*)    All other components within normal limits  CBC WITH DIFFERENTIAL/PLATELET - Abnormal; Notable for the following components:   WBC 18.7 (*)    Neutro Abs 16.1 (*)    All other components within normal limits  COMPREHENSIVE METABOLIC PANEL  LIPASE, BLOOD    EKG  EKG Interpretation None       Radiology Ct Renal Stone Study  Result Date: 10/09/2017 CLINICAL DATA:  Acute onset of right-sided flank pain. EXAM: CT ABDOMEN AND PELVIS WITHOUT CONTRAST TECHNIQUE: Multidetector CT imaging of the abdomen and pelvis was performed following the standard protocol without IV contrast.  COMPARISON:  CT of the abdomen and pelvis from 07/19/2016 FINDINGS: Lower chest: Scattered coronary artery calcifications are seen. The visualized lung bases are clear. The patient is status post median sternotomy. Hepatobiliary: A calcified granuloma is noted at the periphery of the right hepatic lobe. A nonspecific 1.7 cm hypodensity within the right hepatic  lobe is relatively stable and likely reflects a cyst. The gallbladder is unremarkable in appearance. The common bile duct remains normal in caliber. Pancreas: The pancreas is within normal limits. Spleen: The spleen is unremarkable in appearance. Adrenals/Urinary Tract: The adrenal glands are unremarkable in appearance. Minimal right-sided hydronephrosis is noted, with mild prominence of the right ureter along its entire course. An obstructing 4 mm stone is noted at the distal right ureter, 3 cm above the right vesicoureteral junction. There is associated right-sided perinephric stranding and fluid. More mild left-sided perinephric stranding is noted. Scattered calcifications at the renal hila are likely vascular in nature. Stomach/Bowel: The stomach is unremarkable in appearance. The small bowel is within normal limits. The patient is status post appendectomy. The colon is unremarkable in appearance. Vascular/Lymphatic: Scattered calcification is seen along the abdominal aorta and its branches. The abdominal aorta is otherwise grossly unremarkable. The inferior vena cava is grossly unremarkable. No retroperitoneal lymphadenopathy is seen. No pelvic sidewall lymphadenopathy is identified. Reproductive: The bladder is mildly distended and grossly unremarkable. Calcification is noted at the prostate. The prostate is normal in size. Other: No additional soft tissue abnormalities are seen. Musculoskeletal: No acute osseous abnormalities are identified. Chronic bilateral pars defects are seen at L5, without evidence of anterolisthesis. The visualized musculature is  unremarkable in appearance. IMPRESSION: 1. Minimal right-sided hydronephrosis, with obstructing 4 mm stone at the distal right ureter, 3 cm above the right vesicoureteral junction. 2. Scattered coronary artery calcifications. 3. Chronic bilateral pars defects at L5, without evidence of anterolisthesis. Aortic Atherosclerosis (ICD10-I70.0). Electronically Signed   By: Roanna Raider M.D.   On: 10/09/2017 22:30    Procedures Procedures (including critical care time)  Medications Ordered in ED Medications  sodium chloride 0.9 % bolus 500 mL (500 mLs Intravenous New Bag/Given 10/09/17 2255)  ondansetron (ZOFRAN) injection 4 mg (4 mg Intravenous Given 10/09/17 2255)  morphine 4 MG/ML injection 2 mg (2 mg Intravenous Given 10/09/17 2255)  ketorolac (TORADOL) 15 MG/ML injection 15 mg (15 mg Intravenous Given 10/09/17 2300)     Initial Impression / Assessment and Plan / ED Course  I have reviewed the triage vital signs and the nursing notes.  Pertinent labs & imaging results that were available during my care of the patient were reviewed by me and considered in my medical decision making (see chart for details).     MDM  Screen complete  Patient is presenting with right flank pain typical for renal colic.  CT scan shows 4 mm right ureteral stone.  Patient is feeling significantly improved following treatment for his pain and nausea in the ED.  Screening labs otherwise do not show significant abnormalities.  Patient desires discharge home.  Close follow-up is at advised with urology.  Strict return precautions given and understood.  Final Clinical Impressions(s) / ED Diagnoses   Final diagnoses:  Renal colic on right side    ED Discharge Orders        Ordered    ondansetron (ZOFRAN) 4 MG tablet  Every 8 hours PRN     10/09/17 2311    HYDROcodone-acetaminophen (NORCO/VICODIN) 5-325 MG tablet  Every 4 hours PRN     10/09/17 2311       Wynetta Fines, MD 10/10/17 0001

## 2017-10-09 NOTE — ED Notes (Signed)
ED Provider at bedside. 

## 2017-10-09 NOTE — ED Triage Notes (Signed)
Reports right flank pain that started earlier today.  Had appendectomy a year ago.  Reports pain started in Right lower back and now is in RLQ.

## 2017-10-10 NOTE — ED Notes (Signed)
Pt departed in NAD, refused use of wheelchair.  

## 2017-10-23 ENCOUNTER — Other Ambulatory Visit (HOSPITAL_COMMUNITY): Payer: Self-pay | Admitting: *Deleted

## 2017-10-23 MED ORDER — ISOSORBIDE MONONITRATE ER 30 MG PO TB24: 30.0000 mg | ORAL_TABLET | Freq: Every day | ORAL | 3 refills | Status: DC

## 2017-11-09 ENCOUNTER — Other Ambulatory Visit: Payer: Self-pay | Admitting: Internal Medicine

## 2017-11-13 ENCOUNTER — Encounter (HOSPITAL_COMMUNITY): Payer: Self-pay | Admitting: Cardiology

## 2017-11-13 ENCOUNTER — Ambulatory Visit (HOSPITAL_COMMUNITY)
Admission: RE | Admit: 2017-11-13 | Discharge: 2017-11-13 | Disposition: A | Discharge status: Home / Self Care | Payer: Medicare Other | Source: Ambulatory Visit | Attending: Cardiology | Admitting: Cardiology

## 2017-11-13 VITALS — BP 109/72 | HR 64 | Wt 214.0 lb

## 2017-11-13 DIAGNOSIS — G4733 Obstructive sleep apnea (adult) (pediatric): Secondary | ICD-10-CM | POA: Diagnosis not present

## 2017-11-13 DIAGNOSIS — I5042 Chronic combined systolic (congestive) and diastolic (congestive) heart failure: Secondary | ICD-10-CM

## 2017-11-13 DIAGNOSIS — I5022 Chronic systolic (congestive) heart failure: Secondary | ICD-10-CM | POA: Diagnosis not present

## 2017-11-13 DIAGNOSIS — Z87891 Personal history of nicotine dependence: Secondary | ICD-10-CM | POA: Diagnosis not present

## 2017-11-13 DIAGNOSIS — I340 Nonrheumatic mitral (valve) insufficiency: Secondary | ICD-10-CM | POA: Insufficient documentation

## 2017-11-13 DIAGNOSIS — Z7901 Long term (current) use of anticoagulants: Secondary | ICD-10-CM | POA: Diagnosis not present

## 2017-11-13 DIAGNOSIS — I25810 Atherosclerosis of coronary artery bypass graft(s) without angina pectoris: Secondary | ICD-10-CM | POA: Insufficient documentation

## 2017-11-13 DIAGNOSIS — N183 Chronic kidney disease, stage 3 (moderate): Secondary | ICD-10-CM | POA: Diagnosis not present

## 2017-11-13 DIAGNOSIS — Z79899 Other long term (current) drug therapy: Secondary | ICD-10-CM | POA: Diagnosis not present

## 2017-11-13 DIAGNOSIS — I130 Hypertensive heart and chronic kidney disease with heart failure and stage 1 through stage 4 chronic kidney disease, or unspecified chronic kidney disease: Secondary | ICD-10-CM | POA: Diagnosis not present

## 2017-11-13 DIAGNOSIS — I4891 Unspecified atrial fibrillation: Secondary | ICD-10-CM

## 2017-11-13 DIAGNOSIS — I480 Paroxysmal atrial fibrillation: Secondary | ICD-10-CM | POA: Insufficient documentation

## 2017-11-13 LAB — BASIC METABOLIC PANEL
Anion gap: 9 (ref 5–15)
BUN: 31 mg/dL — ABNORMAL HIGH (ref 6–20)
CHLORIDE: 107 mmol/L (ref 101–111)
CO2: 21 mmol/L — ABNORMAL LOW (ref 22–32)
Calcium: 9.3 mg/dL (ref 8.9–10.3)
Creatinine, Ser: 1.37 mg/dL — ABNORMAL HIGH (ref 0.61–1.24)
GFR calc non Af Amer: 50 mL/min — ABNORMAL LOW (ref >60)
GFR, EST AFRICAN AMERICAN: 58 mL/min — AB (ref >60)
Glucose, Bld: 129 mg/dL — ABNORMAL HIGH (ref 65–99)
POTASSIUM: 4.6 mmol/L (ref 3.5–5.1)
SODIUM: 137 mmol/L (ref 135–145)

## 2017-11-13 LAB — DIGOXIN LEVEL: DIGOXIN LVL: 0.5 ng/mL — AB (ref 0.8–2.0)

## 2017-11-13 MED ORDER — METOPROLOL SUCCINATE ER 25 MG PO TB24: 75.0000 mg | ORAL_TABLET | Freq: Two times a day (BID) | ORAL | 6 refills | Status: DC

## 2017-11-13 NOTE — Patient Instructions (Signed)
Increase 75 mg (3 tab), twice a day  Labs drawn today (if we do not call you, then your lab work was stable)   Your physician recommends that you schedule a follow-up appointment in: 4 months (July, 2019) with Dr. Shirlee LatchMcLean Please Call an Schedule Appointment (call in June)

## 2017-11-14 NOTE — Progress Notes (Signed)
Patient ID: Stanley Wells, male   DOB: 03/30/1946, 72 y.o.   MRN: [id number] PCP: Dr. Jeanie Sewer Cardiology: Dr. Shirlee Latch  72 y.o.with history of CAD, ischemic cardiomyopathy/chronic systolic CHF, severe functional mitral regurgitation, and persistent atrial fibrillation presents for followup of CHF and mitral regurgitation.  Patient had MI then CABG in 2003.  Cardiac cath in 2015 showed only LIMA-LAD and SVG-OM patent.  In 2015, EF was 30-35%.  He had atrial flutter in 2015 and again in 11/16, both times was cardioverted.  After the 11/16 cardioversion, he stopped all his meds.  He was admitted again in 12/16 with atrial fibrillation/RVR and a CHF exacerbation.  He was diuresed and started on Eliquis with plan for DCCV after 4 weeks of anticoagulation.  He was brought back at the end of December for cardioversion, but this was unsuccessful.  TEE in 12/16 showed EF 20-25% with severe functional MR.    He was seen by Dr [name] with plan for tentative plan for mitral valve repair versus replacement and possible tricuspid valve repair along with MAZE procedure.  He was referred to CHF clinic for optimization prior to surgery.   He went for left and right heart cath. RHC showed good left and right heart filling pressures but cardiac output was low.  Coronary angiography was stable with patent LIMA-LAD and SVG-OM.  Other SVGs occluded.    He had DCCV in 3/17 and is in NSR today on amiodarone.  He had a CPX in 3/17 with only mild functional impairment from CHF.    He had appendicitis with appendectomy in 12/17.   His most recent echo in 5/18 showed EF 40-45% (stable) with trivial MR.    Weight is down 1 lb.  He feels good generally.  No significant exertional dyspnea. He mows his grass and does other yardwork without problems.  No chest pain. No palpitations.  No lightheadedness. No BRBPR/melena.  ECG (personally reviewed): NSR, PAC  Labs (9/16): LDL 126, HDL 38 Labs (12/16): K 4, creatinine 1.15 Labs  (1/17): K 4.4, creatinine 1.09, LDL 42, HDL 39 Labs (10/01/2015): K 4.5 Creatinine 1.27 Dig level 0.4  Labs (4/17): digoxin 0.4, LFTs normal, TSH normal Labs (5/17): K 4.5, creatinine 1.36 Labs (6/17): K 5, creatinine 1.88, BNP 88, LDL 54, HDl 39, digoxin 0.8, LFTs normal, TSH normal Labs (9/17): K 4.8, creatinine 1.3, digoxin 0.6, TSH normal, LFTs normal Labs (12/17): K 3.2, creatinine 1.33, digoxin 0.3 Labs (7/18): K 4.8, digoxin 0.2, creatinine 1.22 Labs (10/18): LDL 41, HDL 39, digoxin 0.3 Labs (4/09): K 4.8, creatinine 1.56, hgb 13.9  PMH: 1. CAD: s/p MI in 2003.  CABG x 5 in 2003 with LIMA-LAD, SVG-D1, SVG-OM2, seq SVG-PDA/PLV.  LHC/RHC 11/15 with native arteries essentially occluded.  SVG-D1 and seq SVG-PDA/PLV occluded, LIMA and SVG-OM patent.  LHC (1/17) with patent LIMA-LAD, occluded SVG-D, patent SVG-OM => SVG touches down on inferior branch of OM1 that backfills to superior branch of OM1, there are serial 70-80% stenoses in the proximal inferior OM1 branch that may compromise backflow to superior branch OM1, SVG-PDA/PLV totally occluded, EF 25% . 2. Atrial flutter: DCCV 2015.  Recurred 11/16 with DCCV.   3. Chronic systolic CHF: Ischemic cardiomyopathy.  Echo (2015) with moderate-severe MR, EF 30-35%.  TEE (12/16) with severe central MR (functional), EF 20-25% with diffuse hypokinesis, moderately decreased RV systolic function, moderate TR.  RHC (1/17) with mean RA 5, PA 27/12 mean 18, mean PCWP 10, CI 1.7 Fick/1.83 thermo. EF 25% on  1/17 LV-gram.  Cardiac MRI (2/17) with EF 19%, dilated LV, images not adequate to assess delayed enhancement.  - CPX (3/17) with peak VO2 20.4, VE/VCO2 slope 34, RER 1.11 => mild functional impairment.  - Echo (5/17) with EF 40-45%, trivial MR, mild RV dilation with low normal RV systolic function.  - Echo (5/18) with EF 40-45%, basal to mid inferior HK, moderate LVH, trivial MR.  4. Mitral regurgitation: Severe on 12/16 TEE, but was only trivial on echo in  5/17 and 5/18 with aggressive medical treatment.  5. OSA: Not using CPAP.  6. HTN 7. Nephrolithiasis 8. Atrial fibrillation: Noted in 12/16 initially.  Paroxysmal.  Had DCCV to NSR in 3/17.  9. PFTs (1/17) with minimal obstructive airways disease but severely decreased DLCO (37% predicted), possibly suggestive of pulmonary vascular disease.  10. ABIs (5/17) were normal.  11. CKD 12. Appendectomy 12/17.  SH: Prior smoker quit 2013, lives in [place], retired Art gallery manager, married.   FH: No premature CAD.  ROS: All systems reviewed and negative except as per HPI.   Current Outpatient Medications  Medication Sig Dispense Refill  . apixaban (ELIQUIS) 5 MG TABS tablet Take 1 tablet (5 mg total) 2 (two) times daily by mouth. 60 tablet 5  . DIGITEK 125 MCG tablet take 1 tablet by mouth once daily 30 tablet 6  . ENTRESTO 24-26 MG TAKE 1 TABLET BY MOUTH TWICE A DAY 60 tablet 0  . furosemide (LASIX) 20 MG tablet Take 2 tablets (40 mg total) by mouth as needed (for 3 lb weight gain over night or 5 lb weight gain in a week). 90 tablet 6  . HYDROcodone-acetaminophen (NORCO/VICODIN) 5-325 MG tablet Take 2 tablets by mouth every 4 (four) hours as needed. 10 tablet 0  . isosorbide mononitrate (IMDUR) 30 MG 24 hr tablet Take 1 tablet (30 mg total) by mouth daily. 30 tablet 3  . metoprolol succinate (TOPROL-XL) 25 MG 24 hr tablet Take 3 tablets (75 mg total) by mouth 2 (two) times daily. 180 tablet 6  . ondansetron (ZOFRAN) 4 MG tablet Take 1 tablet (4 mg total) by mouth every 8 (eight) hours as needed for nausea or vomiting. 4 tablet 0  . rosuvastatin (CRESTOR) 40 MG tablet take 1 tablet by mouth once daily (Patient taking differently: take 1 tablet by mouth in the evening) 90 tablet 3  . spironolactone (ALDACTONE) 25 MG tablet Take 1 tablet (25 mg total) by mouth daily. 30 tablet 6   No current facility-administered medications for this encounter.    BP 109/72   Pulse 64   Wt 214 lb (97.1 kg)    SpO2 97%   BMI 29.85 kg/m  General: NAD Neck: No JVD, no thyromegaly or thyroid nodule.  Lungs: Clear to auscultation bilaterally with normal respiratory effort. CV: Nondisplaced PMI.  Heart regular S1/S2, no S3/S4, no murmur.  No peripheral edema.  No carotid bruit.  Normal pedal pulses.  Abdomen: Soft, nontender, no hepatosplenomegaly, no distention.  Skin: Intact without lesions or rashes.  Neurologic: Alert and oriented x 3.  Psych: Normal affect. Extremities: No clubbing or cyanosis.  HEENT: Normal.   Assessment/Plan: 1. Chronic systolic CHF: EF 52-84% on TEE from 12/16.  cMRI 2/17 with EF 19%, unable to assess for delayed enhancement on this study.  Ischemic cardiomyopathy.  Low cardiac output by RHC in 1/17 but filling pressures optimized.  Despite low output on that RHC, he has been doing quite well with medical treatment.  CPX (3/17) with  only mild functional limitation.  Most recent echo in 5/18 showed stable EF 40-45%, now outside of ICD range.  NYHA class I-II.  Euvolemic on exam.  - He is now using Lasix only prn.   - Continue current Entresto and spironolactone. BMET today.  - Increase Toprol XL to 75 mg bid. - Continue digoxin, check level.     - Repeat echo in 5/19.  2. CAD: s/p CABG.  On 1/17 cath, only LIMA-LAD and SVG-OM2 still patent. No chest pain. - No ASA given use of apixaban.   - Continue Crestor. Good lipids in 10/18.  3. Atrial fibrillation: Paroxysmal.  Successful DCCV in 3/17 on amiodarone.  In NSR on ECG today.   - He is now off amiodarone.  - Continue apixaban.   4. Mitral regurgitation: Severe by 12/16 TEE, probably functional.  Much improved on 5/17 echo, only trivial.  Trivial on 5/18 echo.  - Repeat echo in 5/19.  5. CKD: Stage III. BMET today.    Followup in 4 months.   Marca AnconaDalton Maurice Ramseur 11/14/2017

## 2017-12-07 ENCOUNTER — Ambulatory Visit (HOSPITAL_COMMUNITY): Payer: Medicare Other | Attending: Cardiovascular Disease

## 2017-12-07 ENCOUNTER — Other Ambulatory Visit: Payer: Self-pay

## 2017-12-07 DIAGNOSIS — I110 Hypertensive heart disease with heart failure: Secondary | ICD-10-CM | POA: Diagnosis not present

## 2017-12-07 DIAGNOSIS — I5042 Chronic combined systolic (congestive) and diastolic (congestive) heart failure: Secondary | ICD-10-CM | POA: Diagnosis not present

## 2017-12-07 DIAGNOSIS — I2510 Atherosclerotic heart disease of native coronary artery without angina pectoris: Secondary | ICD-10-CM | POA: Diagnosis not present

## 2017-12-07 DIAGNOSIS — Z87891 Personal history of nicotine dependence: Secondary | ICD-10-CM | POA: Diagnosis not present

## 2017-12-07 DIAGNOSIS — I252 Old myocardial infarction: Secondary | ICD-10-CM | POA: Insufficient documentation

## 2017-12-14 ENCOUNTER — Other Ambulatory Visit: Payer: Self-pay | Admitting: Cardiology

## 2017-12-21 ENCOUNTER — Other Ambulatory Visit (HOSPITAL_COMMUNITY): Payer: Self-pay | Admitting: *Deleted

## 2017-12-21 MED ORDER — SPIRONOLACTONE 25 MG PO TABS: 25.0000 mg | ORAL_TABLET | Freq: Every day | ORAL | 6 refills | Status: DC

## 2017-12-21 MED ORDER — APIXABAN 5 MG PO TABS: 5.0000 mg | ORAL_TABLET | Freq: Two times a day (BID) | ORAL | 5 refills | Status: DC

## 2017-12-23 ENCOUNTER — Other Ambulatory Visit (HOSPITAL_COMMUNITY): Payer: Self-pay | Admitting: *Deleted

## 2017-12-23 MED ORDER — DIGOXIN 125 MCG PO TABS: 125.0000 ug | ORAL_TABLET | Freq: Every day | ORAL | 6 refills | Status: DC

## 2018-01-13 ENCOUNTER — Other Ambulatory Visit (HOSPITAL_COMMUNITY): Payer: Self-pay

## 2018-01-13 MED ORDER — ROSUVASTATIN CALCIUM 40 MG PO TABS: 40.0000 mg | ORAL_TABLET | Freq: Every day | ORAL | 3 refills | Status: DC

## 2018-02-19 ENCOUNTER — Other Ambulatory Visit (HOSPITAL_COMMUNITY): Payer: Self-pay | Admitting: Cardiology

## 2018-02-19 MED ORDER — ISOSORBIDE MONONITRATE ER 30 MG PO TB24: 30.0000 mg | ORAL_TABLET | Freq: Every day | ORAL | 3 refills | Status: DC

## 2018-03-19 ENCOUNTER — Ambulatory Visit (HOSPITAL_COMMUNITY)
Admission: RE | Admit: 2018-03-19 | Discharge: 2018-03-19 | Disposition: A | Discharge status: Home / Self Care | Payer: Medicare Other | Source: Ambulatory Visit | Attending: Cardiology | Admitting: Cardiology

## 2018-03-19 ENCOUNTER — Other Ambulatory Visit: Payer: Self-pay

## 2018-03-19 ENCOUNTER — Telehealth (HOSPITAL_COMMUNITY): Payer: Self-pay | Admitting: *Deleted

## 2018-03-19 VITALS — BP 96/65 | HR 60 | Wt 217.0 lb

## 2018-03-19 DIAGNOSIS — Z09 Encounter for follow-up examination after completed treatment for conditions other than malignant neoplasm: Secondary | ICD-10-CM | POA: Diagnosis not present

## 2018-03-19 DIAGNOSIS — I4891 Unspecified atrial fibrillation: Secondary | ICD-10-CM

## 2018-03-19 DIAGNOSIS — I340 Nonrheumatic mitral (valve) insufficiency: Secondary | ICD-10-CM | POA: Insufficient documentation

## 2018-03-19 DIAGNOSIS — Z79891 Long term (current) use of opiate analgesic: Secondary | ICD-10-CM | POA: Insufficient documentation

## 2018-03-19 DIAGNOSIS — Z7901 Long term (current) use of anticoagulants: Secondary | ICD-10-CM | POA: Insufficient documentation

## 2018-03-19 DIAGNOSIS — I5042 Chronic combined systolic (congestive) and diastolic (congestive) heart failure: Secondary | ICD-10-CM | POA: Diagnosis not present

## 2018-03-19 DIAGNOSIS — I130 Hypertensive heart and chronic kidney disease with heart failure and stage 1 through stage 4 chronic kidney disease, or unspecified chronic kidney disease: Secondary | ICD-10-CM | POA: Insufficient documentation

## 2018-03-19 DIAGNOSIS — Z79899 Other long term (current) drug therapy: Secondary | ICD-10-CM | POA: Diagnosis not present

## 2018-03-19 DIAGNOSIS — I252 Old myocardial infarction: Secondary | ICD-10-CM | POA: Insufficient documentation

## 2018-03-19 DIAGNOSIS — I255 Ischemic cardiomyopathy: Secondary | ICD-10-CM | POA: Insufficient documentation

## 2018-03-19 DIAGNOSIS — I491 Atrial premature depolarization: Secondary | ICD-10-CM | POA: Diagnosis not present

## 2018-03-19 DIAGNOSIS — N183 Chronic kidney disease, stage 3 (moderate): Secondary | ICD-10-CM | POA: Insufficient documentation

## 2018-03-19 DIAGNOSIS — I480 Paroxysmal atrial fibrillation: Secondary | ICD-10-CM | POA: Insufficient documentation

## 2018-03-19 DIAGNOSIS — I5022 Chronic systolic (congestive) heart failure: Secondary | ICD-10-CM | POA: Diagnosis not present

## 2018-03-19 DIAGNOSIS — G4733 Obstructive sleep apnea (adult) (pediatric): Secondary | ICD-10-CM | POA: Diagnosis not present

## 2018-03-19 DIAGNOSIS — I25810 Atherosclerosis of coronary artery bypass graft(s) without angina pectoris: Secondary | ICD-10-CM | POA: Insufficient documentation

## 2018-03-19 LAB — LIPID PANEL
CHOLESTEROL: 122 mg/dL (ref 0–200)
HDL: 37 mg/dL — AB (ref >40)
LDL Cholesterol: 45 mg/dL (ref 0–99)
Total CHOL/HDL Ratio: 3.3 RATIO
Triglycerides: 202 mg/dL — ABNORMAL HIGH (ref <150)
VLDL: 40 mg/dL (ref 0–40)

## 2018-03-19 LAB — CBC
HEMATOCRIT: 41.4 % (ref 39.0–52.0)
HEMOGLOBIN: 13.1 g/dL (ref 13.0–17.0)
MCH: 30.8 pg (ref 26.0–34.0)
MCHC: 31.6 g/dL (ref 30.0–36.0)
MCV: 97.2 fL (ref 78.0–100.0)
Platelets: 236 10*3/uL (ref 150–400)
RBC: 4.26 MIL/uL (ref 4.22–5.81)
RDW: 14 % (ref 11.5–15.5)
WBC: 11.7 10*3/uL — ABNORMAL HIGH (ref 4.0–10.5)

## 2018-03-19 LAB — BASIC METABOLIC PANEL
ANION GAP: 9 (ref 5–15)
BUN: 36 mg/dL — ABNORMAL HIGH (ref 8–23)
CALCIUM: 9.1 mg/dL (ref 8.9–10.3)
CHLORIDE: 108 mmol/L (ref 98–111)
CO2: 19 mmol/L — AB (ref 22–32)
CREATININE: 1.8 mg/dL — AB (ref 0.61–1.24)
GFR calc non Af Amer: 36 mL/min — ABNORMAL LOW (ref >60)
GFR, EST AFRICAN AMERICAN: 42 mL/min — AB (ref >60)
Glucose, Bld: 123 mg/dL — ABNORMAL HIGH (ref 70–99)
Potassium: 4.7 mmol/L (ref 3.5–5.1)
Sodium: 136 mmol/L (ref 135–145)

## 2018-03-19 NOTE — Patient Instructions (Signed)
Labs today (will call for abnormal results, otherwise no news is good news)  STOP taking Digoxin  Labs in 3 months (bmet)  Follow up in 6 months, please call our clinic in January 2020 to schedule your appointment. [phone], Option 3.

## 2018-03-19 NOTE — Telephone Encounter (Signed)
Result Notes for Lipid Profile   Notes recorded by Georgina PeerFarver, Darnelle Derrick S, RN on 03/19/2018 at 2:40 PM EDT Patient is aware and agreeable with plan. Lab appt scheduled and bmet ordered. ------  Notes recorded by Laurey MoraleMcLean, Dalton S, MD on 03/19/2018 at 11:50 AM EDT Labs look dehydrated. Stay off Lasix and drink more fluid. Repeat BMET 2 wks.

## 2018-03-20 NOTE — Progress Notes (Signed)
Patient ID: Stanley Wells, male   DOB: 09/26/1945, 72 y.o.   MRN: [id number] PCP: Dr. Jeanie Seweredding Cardiology: Dr. Shirlee LatchMcLean  72 y.o.with history of CAD, ischemic cardiomyopathy/chronic systolic CHF, severe functional mitral regurgitation, and persistent atrial fibrillation presents for followup of CHF and mitral regurgitation.  Patient had MI then CABG in 2003.  Cardiac cath in 2015 showed only LIMA-LAD and SVG-OM patent.  In 2015, EF was 30-35%.  He had atrial flutter in 2015 and again in 11/16, both times was cardioverted.  After the 11/16 cardioversion, he stopped all his meds.  He was admitted again in 12/16 with atrial fibrillation/RVR and a CHF exacerbation.  He was diuresed and started on Eliquis with plan for DCCV after 4 weeks of anticoagulation.  He was brought back at the end of December for cardioversion, but this was unsuccessful.  TEE in 12/16 showed EF 20-25% with severe functional MR.    He was seen by Dr Cornelius Moraswen with plan for tentative plan for mitral valve repair versus replacement and possible tricuspid valve repair along with MAZE procedure.  He was referred to CHF clinic for optimization prior to surgery.   He went for left and right heart cath. RHC showed good left and right heart filling pressures but cardiac output was low.  Coronary angiography was stable with patent LIMA-LAD and SVG-OM.  Other SVGs occluded.    He had DCCV in 3/17 and is in NSR today on amiodarone.  He had a CPX in 3/17 with only mild functional impairment from CHF.    He had appendicitis with appendectomy in 12/17.   Echo in 5/18 showed EF 40-45% (stable) with trivial MR.  Echo was done today, EF up to 55-60% with no significant MR.   He is doing well.  Able to do all activities without exertional dyspnea.  No chest pain.  He has not used any Lasix recently.  No orthopnea/PND.  Weight fairly stable.  BP soft today but no lightheadedness.   ECG (personally reviewed): NSR at 56, PAC  Labs (9/16): LDL 126, HDL  38 Labs (12/16): K 4, creatinine 1.15 Labs (1/17): K 4.4, creatinine 1.09, LDL 42, HDL 39 Labs (10/01/2015): K 4.5 Creatinine 1.27 Dig level 0.4  Labs (4/17): digoxin 0.4, LFTs normal, TSH normal Labs (5/17): K 4.5, creatinine 1.36 Labs (6/17): K 5, creatinine 1.88, BNP 88, LDL 54, HDl 39, digoxin 0.8, LFTs normal, TSH normal Labs (9/17): K 4.8, creatinine 1.3, digoxin 0.6, TSH normal, LFTs normal Labs (12/17): K 3.2, creatinine 1.33, digoxin 0.3 Labs (7/18): K 4.8, digoxin 0.2, creatinine 1.22 Labs (10/18): LDL 41, HDL 39, digoxin 0.3 Labs (1/473/19): K 4.8, creatinine 1.56, hgb 13.9 Labs (4/19): digoxin 0.5, K 4.6, creatinine 1.37  PMH: 1. CAD: s/p MI in 2003.  CABG x 5 in 2003 with LIMA-LAD, SVG-D1, SVG-OM2, seq SVG-PDA/PLV.  LHC/RHC 11/15 with native arteries essentially occluded.  SVG-D1 and seq SVG-PDA/PLV occluded, LIMA and SVG-OM patent.  LHC (1/17) with patent LIMA-LAD, occluded SVG-D, patent SVG-OM => SVG touches down on inferior branch of OM1 that backfills to superior branch of OM1, there are serial 70-80% stenoses in the proximal inferior OM1 branch that may compromise backflow to superior branch OM1, SVG-PDA/PLV totally occluded, EF 25% . 2. Atrial flutter: DCCV 2015.  Recurred 11/16 with DCCV.   3. Chronic systolic CHF: Ischemic cardiomyopathy.  Echo (2015) with moderate-severe MR, EF 30-35%.  TEE (12/16) with severe central MR (functional), EF 20-25% with diffuse hypokinesis, moderately decreased RV systolic function,  moderate TR.  RHC (1/17) with mean RA 5, PA 27/12 mean 18, mean PCWP 10, CI 1.7 Fick/1.83 thermo. EF 25% on 1/17 LV-gram.  Cardiac MRI (2/17) with EF 19%, dilated LV, images not adequate to assess delayed enhancement.  - CPX (3/17) with peak VO2 20.4, VE/VCO2 slope 34, RER 1.11 => mild functional impairment.  - Echo (5/17) with EF 40-45%, trivial MR, mild RV dilation with low normal RV systolic function.  - Echo (5/18) with EF 40-45%, basal to mid inferior HK, moderate  LVH, trivial MR.  - Echo (8/19) with EF 55-60%, no significant MR.  4. Mitral regurgitation: Severe on 12/16 TEE, but was only trivial on echo in 5/17 and 5/18 with aggressive medical treatment. 8/19 echo with no significant MR.  5. OSA: Not using CPAP.  6. HTN 7. Nephrolithiasis 8. Atrial fibrillation: Noted in 12/16 initially.  Paroxysmal.  Had DCCV to NSR in 3/17.  9. PFTs (1/17) with minimal obstructive airways disease but severely decreased DLCO (37% predicted), possibly suggestive of pulmonary vascular disease.  10. ABIs (5/17) were normal.  11. CKD 12. Appendectomy 12/17.  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   FH: No premature CAD.  ROS: All systems reviewed and negative except as per HPI.   Current Outpatient Medications  Medication Sig Dispense Refill  . apixaban (ELIQUIS) 5 MG TABS tablet Take 1 tablet (5 mg total) by mouth 2 (two) times daily. 60 tablet 5  . ENTRESTO 24-26 MG TAKE 1 TABLET BY MOUTH TWICE DAILY. 60 tablet 6  . furosemide (LASIX) 20 MG tablet Take 2 tablets (40 mg total) by mouth as needed (for 3 lb weight gain over night or 5 lb weight gain in a week). 90 tablet 6  . HYDROcodone-acetaminophen (NORCO/VICODIN) 5-325 MG tablet Take 2 tablets by mouth every 4 (four) hours as needed. 10 tablet 0  . isosorbide mononitrate (IMDUR) 30 MG 24 hr tablet Take 1 tablet (30 mg total) by mouth daily. 30 tablet 3  . metoprolol succinate (TOPROL-XL) 25 MG 24 hr tablet Take 3 tablets (75 mg total) by mouth 2 (two) times daily. 180 tablet 6  . ondansetron (ZOFRAN) 4 MG tablet Take 1 tablet (4 mg total) by mouth every 8 (eight) hours as needed for nausea or vomiting. 4 tablet 0  . rosuvastatin (CRESTOR) 40 MG tablet Take 1 tablet (40 mg total) by mouth daily. 90 tablet 3  . spironolactone (ALDACTONE) 25 MG tablet Take 1 tablet (25 mg total) by mouth daily. 30 tablet 6   No current facility-administered medications for this encounter.     BP 96/65 (BP Location: Left Arm, Patient Position: Sitting)   Pulse 60   Wt 98.4 kg (217 lb)   SpO2 99%   BMI 30.27 kg/m  General: NAD Neck: No JVD, no thyromegaly or thyroid nodule.  Lungs: Clear to auscultation bilaterally with normal respiratory effort. CV: Nondisplaced PMI.  Heart regular S1/S2, no S3/S4, no murmur.  No peripheral edema.  No carotid bruit.  Normal pedal pulses.  Abdomen: Soft, nontender, no hepatosplenomegaly, no distention.  Skin: Intact without lesions or rashes.  Neurologic: Alert and oriented x 3.  Psych: Normal affect. Extremities: No clubbing or cyanosis.  HEENT: Normal.   Assessment/Plan: 1. Chronic systolic CHF: EF 82-95%20-25% on TEE from 12/16.  cMRI 2/17 with EF 19%, unable to assess for delayed enhancement on this study.  Ischemic cardiomyopathy.  Low cardiac output by RHC in 1/17 but filling pressures optimized.  Despite low output on that RHC, he has been doing quite well with medical treatment.  CPX (3/17) with only mild functional limitation.  Most recent echo today was reviewed and shows EF up to 55-60%. NYHA class I-II.  Euvolemic on exam.  - He has not needed Lasix.   - Continue current Toprol XL, Entresto, and spironolactone. BMET today and again in 3 months.  - With recovery of EF, he can stop digoxin.     2. CAD: s/p CABG.  On 1/17 cath, only LIMA-LAD and SVG-OM2 still patent. No chest pain. - No ASA given use of apixaban.   - Continue Crestor. Check lipids today.  3. Atrial fibrillation: Paroxysmal.  Successful DCCV in 3/17 on amiodarone.  In NSR on ECG today.    - Continue apixaban.  CBC today.  4. Mitral regurgitation: Severe by 12/16 TEE, probably functional.  Much improved on 5/17 echo, only trivial.  Trivial on 5/18 echo. No significant MR on echo today.  5. CKD: Stage III. BMET today.    Followup in 6 months.   Marca Ancona 03/20/2018

## 2018-03-24 ENCOUNTER — Other Ambulatory Visit (HOSPITAL_COMMUNITY): Payer: Medicare Other

## 2018-03-31 ENCOUNTER — Ambulatory Visit (HOSPITAL_COMMUNITY)
Admission: RE | Admit: 2018-03-31 | Discharge: 2018-03-31 | Disposition: A | Discharge status: Home / Self Care | Payer: Medicare Other | Source: Ambulatory Visit | Attending: Cardiology | Admitting: Cardiology

## 2018-03-31 DIAGNOSIS — I5042 Chronic combined systolic (congestive) and diastolic (congestive) heart failure: Secondary | ICD-10-CM | POA: Insufficient documentation

## 2018-03-31 LAB — BASIC METABOLIC PANEL
ANION GAP: 8 (ref 5–15)
BUN: 27 mg/dL — ABNORMAL HIGH (ref 8–23)
CHLORIDE: 107 mmol/L (ref 98–111)
CO2: 23 mmol/L (ref 22–32)
Calcium: 9.2 mg/dL (ref 8.9–10.3)
Creatinine, Ser: 1.4 mg/dL — ABNORMAL HIGH (ref 0.61–1.24)
GFR calc non Af Amer: 49 mL/min — ABNORMAL LOW (ref >60)
GFR, EST AFRICAN AMERICAN: 57 mL/min — AB (ref >60)
Glucose, Bld: 123 mg/dL — ABNORMAL HIGH (ref 70–99)
POTASSIUM: 4.7 mmol/L (ref 3.5–5.1)
Sodium: 138 mmol/L (ref 135–145)

## 2018-06-15 ENCOUNTER — Other Ambulatory Visit (HOSPITAL_COMMUNITY): Payer: Self-pay | Admitting: Internal Medicine

## 2018-06-17 ENCOUNTER — Other Ambulatory Visit (HOSPITAL_COMMUNITY): Payer: Self-pay

## 2018-06-17 MED ORDER — METOPROLOL SUCCINATE ER 25 MG PO TB24: 75.0000 mg | ORAL_TABLET | Freq: Two times a day (BID) | ORAL | 6 refills | Status: DC

## 2018-06-17 MED ORDER — APIXABAN 5 MG PO TABS: 5.0000 mg | ORAL_TABLET | Freq: Two times a day (BID) | ORAL | 5 refills | Status: DC

## 2018-06-18 ENCOUNTER — Telehealth (HOSPITAL_COMMUNITY): Payer: Self-pay

## 2018-06-18 ENCOUNTER — Ambulatory Visit (HOSPITAL_COMMUNITY)
Admission: RE | Admit: 2018-06-18 | Discharge: 2018-06-18 | Disposition: A | Discharge status: Home / Self Care | Payer: Medicare Other | Source: Ambulatory Visit | Attending: Cardiology | Admitting: Cardiology

## 2018-06-18 DIAGNOSIS — I5042 Chronic combined systolic (congestive) and diastolic (congestive) heart failure: Secondary | ICD-10-CM | POA: Insufficient documentation

## 2018-06-18 LAB — BASIC METABOLIC PANEL
Anion gap: 8 (ref 5–15)
BUN: 26 mg/dL — AB (ref 8–23)
CALCIUM: 9.4 mg/dL (ref 8.9–10.3)
CHLORIDE: 107 mmol/L (ref 98–111)
CO2: 22 mmol/L (ref 22–32)
CREATININE: 1.41 mg/dL — AB (ref 0.61–1.24)
GFR calc Af Amer: 56 mL/min — ABNORMAL LOW (ref >60)
GFR calc non Af Amer: 49 mL/min — ABNORMAL LOW (ref >60)
Glucose, Bld: 127 mg/dL — ABNORMAL HIGH (ref 70–99)
Potassium: 5 mmol/L (ref 3.5–5.1)
Sodium: 137 mmol/L (ref 135–145)

## 2018-06-18 NOTE — Telephone Encounter (Signed)
Pt called with lab results verbalized understanding  

## 2018-06-22 ENCOUNTER — Other Ambulatory Visit: Payer: Self-pay

## 2018-07-12 ENCOUNTER — Other Ambulatory Visit: Payer: Self-pay | Admitting: Cardiology

## 2018-07-20 ENCOUNTER — Other Ambulatory Visit (HOSPITAL_COMMUNITY): Payer: Self-pay

## 2018-07-20 MED ORDER — SPIRONOLACTONE 25 MG PO TABS: 25.0000 mg | ORAL_TABLET | Freq: Every day | ORAL | 5 refills | Status: DC

## 2018-07-23 ENCOUNTER — Encounter (HOSPITAL_COMMUNITY): Payer: Self-pay | Admitting: Emergency Medicine

## 2018-07-23 ENCOUNTER — Emergency Department (HOSPITAL_COMMUNITY)
Admission: EM | Admit: 2018-07-23 | Discharge: 2018-07-23 | Disposition: A | Discharge status: Home / Self Care | Payer: Medicare Other | Attending: Emergency Medicine | Admitting: Emergency Medicine

## 2018-07-23 ENCOUNTER — Emergency Department (HOSPITAL_COMMUNITY): Payer: Medicare Other

## 2018-07-23 DIAGNOSIS — R000 Tachycardia, unspecified: Secondary | ICD-10-CM | POA: Diagnosis present

## 2018-07-23 DIAGNOSIS — I4892 Unspecified atrial flutter: Secondary | ICD-10-CM | POA: Diagnosis present

## 2018-07-23 DIAGNOSIS — I5042 Chronic combined systolic (congestive) and diastolic (congestive) heart failure: Secondary | ICD-10-CM | POA: Insufficient documentation

## 2018-07-23 DIAGNOSIS — I2510 Atherosclerotic heart disease of native coronary artery without angina pectoris: Secondary | ICD-10-CM | POA: Diagnosis not present

## 2018-07-23 DIAGNOSIS — Z87891 Personal history of nicotine dependence: Secondary | ICD-10-CM | POA: Diagnosis not present

## 2018-07-23 DIAGNOSIS — Z79899 Other long term (current) drug therapy: Secondary | ICD-10-CM | POA: Insufficient documentation

## 2018-07-23 DIAGNOSIS — Z951 Presence of aortocoronary bypass graft: Secondary | ICD-10-CM | POA: Insufficient documentation

## 2018-07-23 DIAGNOSIS — I4891 Unspecified atrial fibrillation: Secondary | ICD-10-CM | POA: Diagnosis not present

## 2018-07-23 DIAGNOSIS — J40 Bronchitis, not specified as acute or chronic: Secondary | ICD-10-CM | POA: Diagnosis not present

## 2018-07-23 DIAGNOSIS — Z7901 Long term (current) use of anticoagulants: Secondary | ICD-10-CM | POA: Insufficient documentation

## 2018-07-23 DIAGNOSIS — I10 Essential (primary) hypertension: Secondary | ICD-10-CM | POA: Insufficient documentation

## 2018-07-23 DIAGNOSIS — R05 Cough: Secondary | ICD-10-CM | POA: Diagnosis not present

## 2018-07-23 HISTORY — DX: Unspecified atrial flutter: I48.92

## 2018-07-23 LAB — CBC WITH DIFFERENTIAL/PLATELET
Abs Immature Granulocytes: 0.06 10*3/uL (ref 0.00–0.07)
BASOS ABS: 0 10*3/uL (ref 0.0–0.1)
Basophils Relative: 0 %
Eosinophils Absolute: 0 10*3/uL (ref 0.0–0.5)
Eosinophils Relative: 0 %
HEMATOCRIT: 43 % (ref 39.0–52.0)
HEMOGLOBIN: 13.8 g/dL (ref 13.0–17.0)
IMMATURE GRANULOCYTES: 1 %
LYMPHS ABS: 1.5 10*3/uL (ref 0.7–4.0)
Lymphocytes Relative: 14 %
MCH: 30.4 pg (ref 26.0–34.0)
MCHC: 32.1 g/dL (ref 30.0–36.0)
MCV: 94.7 fL (ref 80.0–100.0)
MONOS PCT: 11 %
Monocytes Absolute: 1.1 10*3/uL — ABNORMAL HIGH (ref 0.1–1.0)
NEUTROS ABS: 8 10*3/uL — AB (ref 1.7–7.7)
NEUTROS PCT: 74 %
NRBC: 0 % (ref 0.0–0.2)
Platelets: 227 10*3/uL (ref 150–400)
RBC: 4.54 MIL/uL (ref 4.22–5.81)
RDW: 13.7 % (ref 11.5–15.5)
WBC: 10.7 10*3/uL — ABNORMAL HIGH (ref 4.0–10.5)

## 2018-07-23 LAB — BASIC METABOLIC PANEL
ANION GAP: 13 (ref 5–15)
BUN: 26 mg/dL — ABNORMAL HIGH (ref 8–23)
CALCIUM: 9.2 mg/dL (ref 8.9–10.3)
CO2: 22 mmol/L (ref 22–32)
Chloride: 103 mmol/L (ref 98–111)
Creatinine, Ser: 1.49 mg/dL — ABNORMAL HIGH (ref 0.61–1.24)
GFR calc non Af Amer: 47 mL/min — ABNORMAL LOW (ref >60)
GFR, EST AFRICAN AMERICAN: 54 mL/min — AB (ref >60)
GLUCOSE: 128 mg/dL — AB (ref 70–99)
Potassium: 4.6 mmol/L (ref 3.5–5.1)
Sodium: 138 mmol/L (ref 135–145)

## 2018-07-23 LAB — I-STAT TROPONIN, ED: Troponin i, poc: 0.02 ng/mL (ref 0.00–0.08)

## 2018-07-23 MED ORDER — DILTIAZEM HCL-DEXTROSE 100-5 MG/100ML-% IV SOLN (PREMIX)
5.0000 mg/h | INTRAVENOUS | Status: DC
  Filled 2018-07-23: qty 100

## 2018-07-23 MED ORDER — SODIUM CHLORIDE 0.9 % IV BOLUS
500.0000 mL | Freq: Once | INTRAVENOUS | Status: AC
  Administered 2018-07-23: 500 mL via INTRAVENOUS

## 2018-07-23 MED ORDER — METOPROLOL TARTRATE 5 MG/5ML IV SOLN
5.0000 mg | Freq: Once | INTRAVENOUS | Status: AC
  Administered 2018-07-23: 5 mg via INTRAVENOUS
  Filled 2018-07-23: qty 5

## 2018-07-23 MED ORDER — METOPROLOL TARTRATE 5 MG/5ML IV SOLN: 5.0000 mg | Freq: Once | INTRAVENOUS | Status: DC

## 2018-07-23 MED ORDER — AMIODARONE HCL 200 MG PO TABS: 200.0000 mg | ORAL_TABLET | Freq: Two times a day (BID) | ORAL | 6 refills | Status: DC

## 2018-07-23 MED ORDER — AMIODARONE HCL 200 MG PO TABS
200.0000 mg | ORAL_TABLET | Freq: Two times a day (BID) | ORAL | Status: DC
  Administered 2018-07-23: 200 mg via ORAL
  Filled 2018-07-23: qty 1

## 2018-07-23 MED ORDER — PROPOFOL 10 MG/ML IV BOLUS
0.5000 mg/kg | Freq: Once | INTRAVENOUS | Status: AC
  Administered 2018-07-23: 47.2 mg via INTRAVENOUS

## 2018-07-23 MED ORDER — AMIODARONE IV BOLUS ONLY 150 MG/100ML
150.0000 mg | Freq: Once | INTRAVENOUS | Status: AC
  Administered 2018-07-23: 150 mg via INTRAVENOUS
  Filled 2018-07-23: qty 100

## 2018-07-23 MED ORDER — PROPOFOL 10 MG/ML IV BOLUS
INTRAVENOUS | Status: AC
  Filled 2018-07-23: qty 20

## 2018-07-23 MED ORDER — DILTIAZEM LOAD VIA INFUSION
15.0000 mg | Freq: Once | INTRAVENOUS | Status: DC
  Filled 2018-07-23: qty 15

## 2018-07-23 NOTE — Sedation Documentation (Signed)
Pt shocked at 150 jules.  nsr with pvc's.

## 2018-07-23 NOTE — ED Notes (Signed)
Pt remains in nsr.  Family at bedside.

## 2018-07-23 NOTE — ED Provider Notes (Signed)
  Physical Exam  BP 118/72   Pulse (!) 132   Resp 15   Wt 94.3 kg   SpO2 100%   BMI 29.01 kg/m   Physical Exam  ED Course/Procedures     .Sedation Date/Time: 07/23/2018 5:52 PM Performed by: Jacalyn LefevreHaviland, Joshuan Bolander, MD Authorized by: Jacalyn LefevreHaviland, Mikias Lanz, MD   Consent:    Consent obtained:  Verbal   Consent given by:  Patient   Risks discussed:  Allergic reaction, dysrhythmia, inadequate sedation, nausea, prolonged hypoxia resulting in organ damage, prolonged sedation necessitating reversal, respiratory compromise necessitating ventilatory assistance and intubation and vomiting   Alternatives discussed:  Analgesia without sedation, anxiolysis and regional anesthesia Universal protocol:    Procedure explained and questions answered to patient or proxy's satisfaction: yes     Relevant documents present and verified: yes     Test results available and properly labeled: yes     Imaging studies available: yes     Required blood products, implants, devices, and special equipment available: yes     Site/side marked: yes     Immediately prior to procedure a time out was called: yes     Patient identity confirmation method:  Verbally with patient Indications:    Procedure performed:  Cardioversion   Procedure necessitating sedation performed by:  Different physician Pre-sedation assessment:    Time since last food or drink:  6   ASA classification: class 2 - patient with mild systemic disease     Neck mobility: normal     Mouth opening:  3 or more finger widths   Thyromental distance:  4 finger widths   Mallampati score:  I - soft palate, uvula, fauces, pillars visible   Pre-sedation assessments completed and reviewed: airway patency, cardiovascular function, hydration status, mental status, nausea/vomiting, pain level, respiratory function and temperature   Immediate pre-procedure details:    Reassessment: Patient reassessed immediately prior to procedure     Reviewed: vital signs, relevant  labs/tests and NPO status     Verified: bag valve mask available, emergency equipment available, intubation equipment available, IV patency confirmed, oxygen available and suction available   Procedure details (see MAR for exact dosages):    Preoxygenation:  Nasal cannula   Sedation:  Propofol   Intra-procedure monitoring:  Blood pressure monitoring, cardiac monitor, continuous pulse oximetry, frequent LOC assessments, frequent vital sign checks and continuous capnometry   Intra-procedure events: none     Total Provider sedation time (minutes):  30 Post-procedure details:    Attendance: Constant attendance by certified staff until patient recovered     Recovery: Patient returned to pre-procedure baseline     Post-sedation assessments completed and reviewed: airway patency, cardiovascular function, hydration status, mental status, nausea/vomiting, pain level, respiratory function and temperature     Patient is stable for discharge or admission: yes     Patient tolerance:  Tolerated well, no immediate complications    MDM   Dr. Duke Salviaandolph (cardiology) performed cardioversion.  Pt is now in NSR.  He is now  awake and alert      Jacalyn LefevreHaviland, Gearald Stonebraker, MD 07/23/18 1753

## 2018-07-23 NOTE — Consult Note (Signed)
Cardiology Consultation:   Patient ID: Stanley Wells; [id number]; 01/10/1946   Admit date: 07/23/2018 Date of Consult: 07/23/2018  Primary Care Provider: Noni Saupeedding, [name], MD Primary CHF Cardiologist: Marca Anconaalton McLean, MD  03/19/2018 Primary Electrophysiologist:  None   Patient Profile:   Stanley Wells is a 72 y.o. male with a hx of functional MR (resolved on 03/2018 echo), persistent atrial fib, MI>>CABG x 5 in 2003, Cath 2017 w/ patent LIMA-LAD & SVG-OM w/ EF 30-35% (other grafts occluded), Aflutter w/ DCCV in 2015 & 06/2015 but pt d/c'd meds>>Afib RVR & CHF exacerbation 07/2015, DCCV unsuccessful 07/2015 w/ EF 20-25%, DCCV 10/2015 on amio>>SR, EF 40-45% 12/2016 echo>>55-60% 03/2018 echo, OSA not on CPAP, HTN, CKD III, who is being seen today for the evaluation of atrial flutter and hypotension at the request of Dr Dalene SeltzerSchlossman.  History of Present Illness:   Stanley Wells was seen by Dr Shirlee LatchMcLean 03/2018 and was doing well. Maintaining SR, EF had normalized while eliminated MR. No ischemic sx, compliant w/ meds, on amio and Eliquis.  A couple of months ago, he did a routine check of his blood pressure and noted that his heart rate was over 120.  He did not tell his wife.  His heart rate has been elevated since then on blood pressure checks, but there were not very many of them.  About a week ago, he got an upper respiratory infection.  Mostly a cough, that became productive of greenish/brownish sputum.  He also had some nasal congestion.  No fevers that he knows of.  He did not have chills until last night.  Today, since he has been sick about a week with worsening symptoms, he went to his PCP office to see someone to get antibiotics for the cold.  In his PCPs office, he was told that his heart rate was elevated and a little irregular.  It was recommended that he come to the emergency room.  His wife make sure that he did so.  He has not been having problems with lower extremity edema.   He denies orthopnea or PND.  He feels his weight is stable.  He has not had any chest pain.  He has not had any presyncope or syncope.  He is not really aware of the atrial flutter.  He has not missed any doses of the Eliquis.    Past Medical History:  Diagnosis Date  . Atrial flutter with rapid ventricular response (HCC) 07/23/2018  . CAD (coronary artery disease)    a. s/p CABG 2003  b. s/p acute MI prior to CABG w/ EF <30%  . Cardiomyopathy, ischemic: EF 20-25%   . Cholecystitis   . Chronic combined systolic and diastolic CHF (congestive heart failure) (HCC)   . Chronic systolic CHF (congestive heart failure) (HCC)    a. 2D ECHO: 05/17/2014: EF 30-35%;  b. 07/2015 TEE: EF 20-25%, diff HK, sev MR, sev dil LA w/o thrombus, mod reduced RV fxn, mod dil RA, mod TR.  Marland Kitchen. Hyperlipidemia   . Hypertensive heart disease   . Kidney stones X 1   "passed it" (08/02/2015)  . OSA (obstructive sleep apnea) 10/11/2014   refuses to use CPAP  . Persistent atrial fibrillation    a. s/p succesful TEE/DCCV on 06/01/14.  b. on Eliquis;  c. 07/2015 recurrent AF-->Failed DCCV.  . S/P CABG x 5 02/16/2002   LIMA to LAD, SVG to D1, SVG to OM2, Sequential SVG to PDA-RPL, saphenous vein harvest from right thigh  and lower leg  . Severe mitral regurgitation    Functional, improved with improvement in his EF.  . Tricuspid regurgitation     Past Surgical History:  Procedure Laterality Date  . CARDIAC CATHETERIZATION N/A 06/19/2014   Procedure: RIGHT/LEFT HEART CATH AND CORONARY/GRAFT ANGIOGRAPHY;  Surgeon: Lennette Bihari, MD;  Location: [facility] CATH LAB;  Service: Cardiovascular;  Laterality: N/A;  . CARDIAC CATHETERIZATION  2003  . CARDIAC CATHETERIZATION N/A 08/28/2015   Procedure: Right Heart Cath and Coronary/Graft Angiography;  Surgeon: Laurey Morale, MD;  Location: [facility] INVASIVE CV LAB;  Service: Cardiovascular;  Laterality: N/A;  . CARDIOVERSION N/A 06/01/2014   Procedure: CARDIOVERSION;  Surgeon: Lars Masson, MD;  Location: [facility] ENDOSCOPY;  Service: Cardiovascular;  Laterality: N/A;  . CARDIOVERSION  06/11/2015; 08/02/2015  . CARDIOVERSION N/A 08/02/2015   Procedure: CARDIOVERSION;  Surgeon: Wendall Stade, MD;  Location: [facility] ENDOSCOPY;  Service: Cardiovascular;  Laterality: N/A;  . CARDIOVERSION N/A 10/22/2015   Procedure: CARDIOVERSION;  Surgeon: Laurey Morale, MD;  Location: [facility] ENDOSCOPY;  Service: Cardiovascular;  Laterality: N/A;  . CORONARY ARTERY BYPASS GRAFT  02/16/2002   CABG X5  . LAPAROSCOPIC APPENDECTOMY N/A 07/20/2016   Procedure: APPENDECTOMY LAPAROSCOPIC;  Surgeon: Manus Rudd, MD;  Location: [facility] OR;  Service: General;  Laterality: N/A;  . TEE WITHOUT CARDIOVERSION N/A 06/01/2014   Procedure: TRANSESOPHAGEAL ECHOCARDIOGRAM (TEE);  Surgeon: Lars Masson, MD;  Location: [facility] ENDOSCOPY;  Service: Cardiovascular;  Laterality: N/A;  . TEE WITHOUT CARDIOVERSION N/A 08/03/2015   Procedure: TRANSESOPHAGEAL ECHOCARDIOGRAM (TEE);  Surgeon: Lars Masson, MD;  Location: [facility] ENDOSCOPY;  Service: Cardiovascular;  Laterality: N/A;     Prior to Admission medications   Medication Sig Start Date End Date Taking? Authorizing Provider  apixaban (ELIQUIS) 5 MG TABS tablet Take 1 tablet (5 mg total) by mouth 2 (two) times daily. 06/17/18   Laurey Morale, MD  ENTRESTO 24-26 MG TAKE 1 TABLET BY MOUTH TWICE DAILY 07/12/18   Laurey Morale, MD  furosemide (LASIX) 20 MG tablet Take 2 tablets (40 mg total) by mouth as needed (for 3 lb weight gain over night or 5 lb weight gain in a week). 01/10/16   Clegg, Amy D, NP  HYDROcodone-acetaminophen (NORCO/VICODIN) 5-325 MG tablet Take 2 tablets by mouth every 4 (four) hours as needed. 10/09/17   Wynetta Fines, MD  isosorbide mononitrate (IMDUR) 30 MG 24 hr tablet TAKE 1 TABLET(30 MG) BY MOUTH DAILY 06/15/18   Bensimhon, Bevelyn Buckles, MD  metoprolol succinate (TOPROL-XL) 25 MG 24 hr tablet Take 3 tablets (75 mg total) by mouth 2 (two) times daily. 06/17/18    Laurey Morale, MD  ondansetron (ZOFRAN) 4 MG tablet Take 1 tablet (4 mg total) by mouth every 8 (eight) hours as needed for nausea or vomiting. 10/09/17   Wynetta Fines, MD  rosuvastatin (CRESTOR) 40 MG tablet Take 1 tablet (40 mg total) by mouth daily. 01/13/18   Laurey Morale, MD  spironolactone (ALDACTONE) 25 MG tablet Take 1 tablet (25 mg total) by mouth daily. 07/20/18   Laurey Morale, MD    Inpatient Medications: Scheduled Meds:  Continuous Infusions:  PRN Meds:   Allergies:   No Known Allergies  Social History:   Social History   Socioeconomic History  . Marital status: Married    Spouse name: Not on file  . Number of children: Not on file  . Years of education: Not on file  . Highest education level:  Not on file  Occupational History  . Occupation: Retired  Engineer, production  . Financial resource strain: Not on file  . Food insecurity:    Worry: Not on file    Inability: Not on file  . Transportation needs:    Medical: Not on file    Non-medical: Not on file  Tobacco Use  . Smoking status: Former Smoker    Packs/day: 1.00    Years: 40.00    Pack years: 40.00    Types: Cigarettes    Last attempt to quit: 02/02/2002    Years since quitting: 16.4  . Smokeless tobacco: Never Used  Substance and Sexual Activity  . Alcohol use: Yes    Comment: 08/02/2015 "I rarely have a beer"  . Drug use: No  . Sexual activity: Yes  Lifestyle  . Physical activity:    Days per week: Not on file    Minutes per session: Not on file  . Stress: Not on file  Relationships  . Social connections:    Talks on phone: Not on file    Gets together: Not on file    Attends religious service: Not on file    Active member of club or organization: Not on file    Attends meetings of clubs or organizations: Not on file    Relationship status: Not on file  . Intimate partner violence:    Fear of current or ex partner: Not on file    Emotionally abused: Not on file    Physically  abused: Not on file    Forced sexual activity: Not on file  Other Topics Concern  . Not on file  Social History Narrative   Lives in [place] with wife.    Family History:   Family History  Problem Relation Age of Onset  . Cancer Mother   . Cancer Father   . Hypertension Neg Hx   . Stroke Neg Hx    Family Status:  Family Status  Relation Name Status  . Mother  Deceased at age 35       Cancer  . Father  Deceased at age 26       Cancer  . Neg Hx  (Not Specified)    ROS:  Please see the history of present illness.  All other ROS reviewed and negative.     Physical Exam/Data:   Vitals:   07/23/18 1317 07/23/18 1330 07/23/18 1345 07/23/18 1400  BP:  (!) 119/101 99/87 118/72  Pulse: (!) 140 (!) 129 (!) 132 (!) 132  Resp:  (!) 25 19 15   SpO2:  100% 100% 100%   No intake or output data in the 24 hours ending 07/23/18 1649 There were no vitals filed for this visit. There is no height or weight on file to calculate BMI.  General:  Well nourished, well developed, in no acute distress HEENT: normal Lymph: no adenopathy Neck: no JVD Endocrine:  No thryomegaly Vascular: No carotid bruits; 4/4 extremity pulses 2+, without bruits  Cardiac:  normal S1, S2; irregular rate and rhythm; no murmur  Lungs:  clear to auscultation bilaterally, no wheezing, rhonchi or rales  Abd: soft, nontender, no hepatomegaly  Ext: no edema Musculoskeletal:  No deformities, BUE and BLE strength normal and equal Skin: warm and dry  Neuro:  CNs 2-12 intact, no focal abnormalities noted Psych:  Normal affect   EKG:  The EKG was personally reviewed and demonstrates: Coarse atrial fib versus flutter with a heart rate of 134 Telemetry:  Telemetry was personally reviewed and demonstrates: Rapid atrial flutter  Relevant CV Studies:  ECHO: 12/07/2017 - Left ventricle: Inferobasal hypokinesis The cavity size was   normal. Wall thickness was increased in a pattern of mild LVH.   Systolic function was  normal. The estimated ejection fraction was   in the range of 55% to 60%. Wall motion was normal; there were no   regional wall motion abnormalities. Doppler parameters are   consistent with abnormal left ventricular relaxation (grade 1   diastolic dysfunction). - Left atrium: The atrium was moderately dilated. - Atrial septum: No defect or patent foramen ovale was identified.  CATH: 08/28/2015 1. Left ventriculogram showed EF about 25% with 3+ MR.  Not well-visualized as LV was large and filled poorly.  2. Normal left and right heart filling pressures.  However, cardiac output low by Fick and thermodilution.  3. Coronaries and grafts similar to prior study in 2015.  Patent LIMA-LAD and SVG-OM1.  Other two grafts occluded.  No source of flow to RCA territory other than very weak collaterals from LAD.  Possible compromise to flow to superior division of large OM1 as the SVG touches down on the inferior division of OM1 which has 70-80% proximal stenosis.   He is clinically stable.  I am going to start him on digoxin 0.125 daily.  He will restart Eliquis tomorrow morning.   Laboratory Data:  Chemistry Recent Labs  Lab 07/23/18 1328  NA 138  K 4.6  CL 103  CO2 22  GLUCOSE 128*  BUN 26*  CREATININE 1.49*  CALCIUM 9.2  GFRNONAA 47*  GFRAA 54*  ANIONGAP 13    Lab Results  Component Value Date   ALT 24 10/09/2017   AST 25 10/09/2017   ALKPHOS 62 10/09/2017   BILITOT 0.9 10/09/2017   Hematology Recent Labs  Lab 07/23/18 1328  WBC 10.7*  RBC 4.54  HGB 13.8  HCT 43.0  MCV 94.7  MCH 30.4  MCHC 32.1  RDW 13.7  PLT 227   Cardiac EnzymesNo results for input(s): TROPONINI in the last 168 hours.  Recent Labs  Lab 07/23/18 1334  TROPIPOC 0.02    BNPNo results for input(s): BNP, PROBNP in the last 168 hours.    TSH:  Lab Results  Component Value Date   TSH 3.392 04/11/2016   Lipids: Lab Results  Component Value Date   CHOL 122 03/19/2018   HDL 37 (L) 03/19/2018    LDLCALC 45 03/19/2018   TRIG 202 (H) 03/19/2018   CHOLHDL 3.3 03/19/2018   HgbA1c: Lab Results  Component Value Date   HGBA1C 6.4 (H) 05/16/2014   Magnesium:  Magnesium  Date Value Ref Range Status  08/04/2015 2.2 1.7 - 2.4 mg/dL Final     Radiology/Studies:  Dg Chest Portable 1 View  Result Date: 07/23/2018 CLINICAL DATA:  72 y/o M; atrial fibrillation, RVR, cough for 1 week. EXAM: PORTABLE CHEST 1 VIEW COMPARISON:  07/19/2018 chest radiograph. FINDINGS: Stable cardiomegaly given projection and technique. Post median sternotomy with wires in alignment. Aortic atherosclerosis with calcification. Clear lungs. No pleural effusion or pneumothorax. No acute osseous abnormality is evident. IMPRESSION: No acute pulmonary process identified.  Stable cardiomegaly. Electronically Signed   By: Mitzi Hansen M.D.   On: 07/23/2018 13:41    Assessment and Plan:   Principal Problem: 1.  Atrial flutter with rapid ventricular response (HCC) -He was on amiodarone in 2017, Dr. Shirlee Latch discontinued this in September of that year. - CHA2DS2-VASc = 3 (  age x 1, CHF, HTN) -he has not missed any doses of his Eliquis and has not eaten recently - We will review data with Dr. Duke Salviaandolph.  Consider a bolus of IV amiodarone, cardioversion and discharge on amiodarone 200 mg twice daily, to follow-up in clinic.  2.  Chronic anticoagulation: He denies missing any doses of his Eliquis.  3.  Chronic systolic CHF with improved EF - He is having no heart failure symptoms.  4.  CAD: 2/5 patent grafts at last catheterization in 2017, results are above. -No ongoing ischemic symptoms  5.  Upper respiratory infection: - he has been sick for about a week.  He saw his PCP today who prescribed antibiotics, but he has not started them yet. - Due to the duration of illness with no improvement in symptoms, agree with antibiotics. - pt knows not to take OTC cold meds   For questions or updates, please contact  [facility] Please consult [email or web address] for contact info under Cardiology/STEMI.   Melida QuitterSigned, Winola Drum, PA-C  07/23/2018 4:49 PM

## 2018-07-23 NOTE — ED Triage Notes (Signed)
Pt reports he went to his pcp for cold symptoms, hx of afib and was found to have  A very high heart rate. HR 140s upon arrival. Pt a/ox4, resp e/u ,nad.

## 2018-07-23 NOTE — ED Notes (Signed)
PT remains nsr.

## 2018-07-23 NOTE — ED Provider Notes (Signed)
[facility] Provider Note   CSN: [id number] Arrival date & time: 07/23/18  1303     History   Chief Complaint Chief Complaint  Patient presents with  . Atrial Fibrillation    HPI Stanley Wells is a 72 y.o. male.  Patient with history of atrial fibrillation on apixaban, congestive heart failure/schema cardiomyopathy on as needed Lasix, Entresto, metoprolol, coronary artery disease status post CABG x5, mitral regurg and tricuspid regurg --presents the emergency department for tachycardia.  Patient was seen by his primary care today was found to have a fast heart rate.  He states he went to his doctor for "a cold".  Patient reports nasal congestion and a cough productive of green sputum ongoing over the past week.  He denies any chest pain or shortness of breath.  He denies any lower extremity swelling.  No fevers, nausea, vomiting, or diarrhea.  No abdominal pain or urinary symptoms.  No treatments prior to arrival.  Patient reports that he is compliant with his medications including his anticoagulation and has not missed any doses. The onset of this condition was acute. The course is constant. Aggravating factors: none. Alleviating factors: none.       Past Medical History:  Diagnosis Date  . CAD (coronary artery disease)    a. s/p CABG 2003  b. s/p acute MI prior to CABG w/ EF <30%  . Cardiomyopathy, ischemic: EF 20-25%   . Cholecystitis   . Chronic combined systolic and diastolic CHF (congestive heart failure) (HCC)   . Chronic systolic CHF (congestive heart failure) (HCC)    a. 2D ECHO: 05/17/2014: EF 30-35%;  b. 07/2015 TEE: EF 20-25%, diff HK, sev MR, sev dil LA w/o thrombus, mod reduced RV fxn, mod dil RA, mod TR.  Marland Kitchen. Hyperlipidemia   . Hypertensive heart disease   . Kidney stones X 1   "passed it" (08/02/2015)  . OSA (obstructive sleep apnea) 10/11/2014   refuses to use CPAP  . Persistent atrial fibrillation    a. s/p succesful  TEE/DCCV on 06/01/14.  b. on Eliquis;  c. 07/2015 recurrent AF-->Failed DCCV.  . S/P CABG x 5 02/16/2002   LIMA to LAD, SVG to D1, SVG to OM2, Sequential SVG to PDA-RPL, saphenous vein harvest from right thigh and lower leg  . Severe mitral regurgitation   . Tricuspid regurgitation     Patient Active Problem List   Diagnosis Date Noted  . Ileus, postoperative (HCC)   . Acute appendicitis with localized peritonitis   . Chronic combined systolic and diastolic congestive heart failure (HCC)   . Acute appendicitis 07/19/2016  . Tricuspid regurgitation   . Severe mitral regurgitation 08/04/2015  . Hyperlipidemia   . Hypertensive heart disease   . Atrial fibrillation (HCC) 08/02/2015  . A-fib (HCC) 08/02/2015  . Persistent atrial fibrillation 08/02/2015  . Chronic anticoagulation - Eliquis, CHADS2VASC=4 07/23/2015  . Acute on chronic combined systolic and diastolic heart failure, NYHA class 2 (HCC) 07/13/2015  . Acute on chronic combined systolic and diastolic congestive heart failure, NYHA class 1 (HCC) 07/13/2015  . Mitral regurgitation 07/07/2015  . Elevated troponin 07/07/2015  . Acute kidney injury (HCC) 07/07/2015  . Hypotension 07/07/2015  . Leukocytosis 07/07/2015  . Noncompliance with medication regimen 07/05/2015  . Long term current use of anticoagulant therapy - Eliquis 06/11/2015  . Essential hypertension 05/01/2015  . OSA (obstructive sleep apnea) 10/11/2014  . Cardiomyopathy, ischemic: EF 20-25% 07/06/2014  . Chronic combined systolic and diastolic  CHF (congestive heart failure) (HCC)   . Coronary artery disease involving native coronary artery without angina pectoris   . Cholecystitis   . S/P CABG x 5 02/16/2002    Past Surgical History:  Procedure Laterality Date  . CARDIAC CATHETERIZATION N/A 06/19/2014   Procedure: RIGHT/LEFT HEART CATH AND CORONARY/GRAFT ANGIOGRAPHY;  Surgeon: Lennette Bihari, MD;  Location: [facility] CATH LAB;  Service: Cardiovascular;  Laterality:  N/A;  . CARDIAC CATHETERIZATION  2003  . CARDIAC CATHETERIZATION N/A 08/28/2015   Procedure: Right Heart Cath and Coronary/Graft Angiography;  Surgeon: Laurey Morale, MD;  Location: [facility] INVASIVE CV LAB;  Service: Cardiovascular;  Laterality: N/A;  . CARDIOVERSION N/A 06/01/2014   Procedure: CARDIOVERSION;  Surgeon: Lars Masson, MD;  Location: [facility] ENDOSCOPY;  Service: Cardiovascular;  Laterality: N/A;  . CARDIOVERSION  06/11/2015; 08/02/2015  . CARDIOVERSION N/A 08/02/2015   Procedure: CARDIOVERSION;  Surgeon: Wendall Stade, MD;  Location: [facility] ENDOSCOPY;  Service: Cardiovascular;  Laterality: N/A;  . CARDIOVERSION N/A 10/22/2015   Procedure: CARDIOVERSION;  Surgeon: Laurey Morale, MD;  Location: [facility] ENDOSCOPY;  Service: Cardiovascular;  Laterality: N/A;  . CORONARY ARTERY BYPASS GRAFT  02/16/2002   CABG X5  . LAPAROSCOPIC APPENDECTOMY N/A 07/20/2016   Procedure: APPENDECTOMY LAPAROSCOPIC;  Surgeon: Manus Rudd, MD;  Location: [facility] OR;  Service: General;  Laterality: N/A;  . TEE WITHOUT CARDIOVERSION N/A 06/01/2014   Procedure: TRANSESOPHAGEAL ECHOCARDIOGRAM (TEE);  Surgeon: Lars Masson, MD;  Location: [facility] ENDOSCOPY;  Service: Cardiovascular;  Laterality: N/A;  . TEE WITHOUT CARDIOVERSION N/A 08/03/2015   Procedure: TRANSESOPHAGEAL ECHOCARDIOGRAM (TEE);  Surgeon: Lars Masson, MD;  Location: [facility] ENDOSCOPY;  Service: Cardiovascular;  Laterality: N/A;        Home Medications    Prior to Admission medications   Medication Sig Start Date End Date Taking? Authorizing Provider  apixaban (ELIQUIS) 5 MG TABS tablet Take 1 tablet (5 mg total) by mouth 2 (two) times daily. 06/17/18   Laurey Morale, MD  ENTRESTO 24-26 MG TAKE 1 TABLET BY MOUTH TWICE DAILY 07/12/18   Laurey Morale, MD  furosemide (LASIX) 20 MG tablet Take 2 tablets (40 mg total) by mouth as needed (for 3 lb weight gain over night or 5 lb weight gain in a week). 01/10/16   Clegg, Amy D, NP  HYDROcodone-acetaminophen  (NORCO/VICODIN) 5-325 MG tablet Take 2 tablets by mouth every 4 (four) hours as needed. 10/09/17   Wynetta Fines, MD  isosorbide mononitrate (IMDUR) 30 MG 24 hr tablet TAKE 1 TABLET(30 MG) BY MOUTH DAILY 06/15/18   Bensimhon, Bevelyn Buckles, MD  metoprolol succinate (TOPROL-XL) 25 MG 24 hr tablet Take 3 tablets (75 mg total) by mouth 2 (two) times daily. 06/17/18   Laurey Morale, MD  ondansetron (ZOFRAN) 4 MG tablet Take 1 tablet (4 mg total) by mouth every 8 (eight) hours as needed for nausea or vomiting. 10/09/17   Wynetta Fines, MD  rosuvastatin (CRESTOR) 40 MG tablet Take 1 tablet (40 mg total) by mouth daily. 01/13/18   Laurey Morale, MD  spironolactone (ALDACTONE) 25 MG tablet Take 1 tablet (25 mg total) by mouth daily. 07/20/18   Laurey Morale, MD    Family History Family History  Problem Relation Age of Onset  . Cancer Mother   . Cancer Father   . Hypertension Neg Hx   . Stroke Neg Hx     Social History Social History   Tobacco Use  . Smoking status: Former  Smoker    Packs/day: 1.00    Years: 40.00    Pack years: 40.00    Types: Cigarettes    Last attempt to quit: 02/02/2002    Years since quitting: 16.4  . Smokeless tobacco: Never Used  Substance Use Topics  . Alcohol use: Yes    Comment: 08/02/2015 "I rarely have a beer"  . Drug use: No     Allergies   Patient has no known allergies.   Review of Systems Review of Systems  Constitutional: Negative for diaphoresis and fever.  HENT: Positive for congestion. Negative for rhinorrhea and sore throat.   Eyes: Negative for redness.  Respiratory: Positive for cough. Negative for shortness of breath and wheezing.   Cardiovascular: Negative for chest pain, palpitations and leg swelling.  Gastrointestinal: Negative for abdominal pain, diarrhea, nausea and vomiting.  Genitourinary: Negative for dysuria.  Musculoskeletal: Negative for back pain, myalgias and neck pain.  Skin: Negative for rash.  Neurological: Negative  for syncope, light-headedness and headaches.  Psychiatric/Behavioral: The patient is not nervous/anxious.      Physical Exam Updated Vital Signs Pulse (!) 140   Physical Exam Vitals signs and nursing note reviewed.  Constitutional:      Appearance: He is well-developed. He is not diaphoretic.  HENT:     Head: Normocephalic and atraumatic.     Mouth/Throat:     Mouth: Mucous membranes are not dry.  Eyes:     Conjunctiva/sclera: Conjunctivae normal.  Neck:     Musculoskeletal: Normal range of motion and neck supple. No muscular tenderness.     Vascular: Normal carotid pulses. No carotid bruit or JVD.     Trachea: Trachea normal. No tracheal deviation.  Cardiovascular:     Rate and Rhythm: Regular rhythm. Tachycardia present.     Pulses: No decreased pulses.     Heart sounds: Normal heart sounds, S1 normal and S2 normal. Heart sounds not distant. No murmur.  Pulmonary:     Effort: Pulmonary effort is normal. No respiratory distress.     Breath sounds: Normal breath sounds. No wheezing.  Chest:     Chest wall: No tenderness.  Abdominal:     General: Bowel sounds are normal.     Palpations: Abdomen is soft.     Tenderness: There is no abdominal tenderness. There is no guarding or rebound.  Musculoskeletal:        General: No swelling.  Skin:    General: Skin is warm and dry.     Coloration: Skin is not pale.  Neurological:     Mental Status: He is alert.      ED Treatments / Results  Labs (all labs ordered are listed, but only abnormal results are displayed) Labs Reviewed  CBC WITH DIFFERENTIAL/PLATELET - Abnormal; Notable for the following components:      Result Value   WBC 10.7 (*)    Neutro Abs 8.0 (*)    Monocytes Absolute 1.1 (*)    All other components within normal limits  BASIC METABOLIC PANEL - Abnormal; Notable for the following components:   Glucose, Bld 128 (*)    BUN 26 (*)    Creatinine, Ser 1.49 (*)    GFR calc non Af Amer 47 (*)    GFR calc Af  Amer 54 (*)    All other components within normal limits  I-STAT TROPONIN, ED    EKG EKG Interpretation  Date/Time:  Friday July 23 2018 13:14:40 EST Ventricular Rate:  134 PR Interval:  146 QRS Duration: 104 QT Interval:  268 QTC Calculation: 400 R Axis:   -36 Text Interpretation:  Atrial fibrillation with rapid ventricular response ST-t wave abnormality Abnormal ekg Confirmed by Gerhard MunchLockwood, Robert [phone]([id number]) on 07/23/2018 1:17:19 PM   Radiology Dg Chest Portable 1 View  Result Date: 07/23/2018 CLINICAL DATA:  72 y/o M; atrial fibrillation, RVR, cough for 1 week. EXAM: PORTABLE CHEST 1 VIEW COMPARISON:  07/19/2018 chest radiograph. FINDINGS: Stable cardiomegaly given projection and technique. Post median sternotomy with wires in alignment. Aortic atherosclerosis with calcification. Clear lungs. No pleural effusion or pneumothorax. No acute osseous abnormality is evident. IMPRESSION: No acute pulmonary process identified.  Stable cardiomegaly. Electronically Signed   By: Mitzi HansenLance  Furusawa-Stratton M.D.   On: 07/23/2018 13:41    Procedures Procedures (including critical care time)  Medications Ordered in ED Medications  sodium chloride 0.9 % bolus 500 mL (500 mLs Intravenous New Bag/Given 07/23/18 1422)  metoprolol tartrate (LOPRESSOR) injection 5 mg (5 mg Intravenous Given 07/23/18 1423)     Initial Impression / Assessment and Plan / ED Course  I have reviewed the triage vital signs and the nursing notes.  Pertinent labs & imaging results that were available during my care of the patient were reviewed by me and considered in my medical decision making (see chart for details).     Patient seen and examined. Work-up initiated. Pt tachycardic without significant sx, normotensive. Afib/flutter vs SVT.   Vital signs reviewed and are as follows: BP 118/72   Pulse (!) 132   Resp 15   SpO2 100%   Dr. Dalene SeltzerSchlossman discussed case with cards (Dr. Duke Salviaandolph) on the phone -- suspect EKG  shows aflutter.   No improvement with metoprolol. Will start on cardizem drip. Spoke with cardiology. They will see.  4:15 PM Pt reseen with Dr. Dalene SeltzerSchlossman. Pt in aflutter with variable block, now 95-110. Awaiting cards reccs. Will hold on cardizem unless HR increases.   Handoff to Dr. Smith/Dr. Particia NearingHaviland at shift change.    CRITICAL CARE Performed by: Renne CriglerJoshua Ayleen Mckinstry PA-C Total critical care time: 35 minutes Critical care time was exclusive of separately billable procedures and treating other patients. Critical care was necessary to treat or prevent imminent or life-threatening deterioration. Critical care was time spent personally by me on the following activities: development of treatment plan with patient and/or surrogate as well as nursing, discussions with consultants, evaluation of patient's response to treatment, examination of patient, obtaining history from patient or surrogate, ordering and performing treatments and interventions, ordering and review of laboratory studies, ordering and review of radiographic studies, pulse oximetry and re-evaluation of patient's condition.   Final Clinical Impressions(s) / ED Diagnoses   Final diagnoses:  Atrial flutter with rapid ventricular response (HCC)   Pending cards consult.   ED Discharge Orders    None       Renne CriglerGeiple, Tanvir Hipple, Cordelia Poche-C 07/23/18 1620    Alvira MondaySchlossman, Erin, MD 07/24/18 [phone]

## 2018-07-23 NOTE — CV Procedure (Signed)
Electrical Cardioversion Procedure Note Debby FreibergJames C Corlew [id number] 01/12/1946  Procedure: Electrical Cardioversion Indications:  Atrial Flutter  Procedure Details Consent: Risks of procedure as well as the alternatives and risks of each were explained to the (patient/caregiver).  Consent for procedure obtained. Time Out: Verified patient identification, verified procedure, site/side was marked, verified correct patient position, special equipment/implants available, medications/allergies/relevent history reviewed, required imaging and test results available.  Performed  Patient placed on cardiac monitor, pulse oximetry, supplemental oxygen as necessary.  Sedation given: propofol 50mg  Pacer pads placed anterior chest.  Cardioverted 1 time(s).  Cardioverted at successful.  Evaluation Findings: Post procedure EKG shows: sinus rhythm with PVCs Complications: None Patient did tolerate procedure well.   Chilton Siiffany Kirkland, MD 07/23/2018, 5:56 PM

## 2018-07-26 ENCOUNTER — Telehealth (HOSPITAL_COMMUNITY): Payer: Self-pay | Admitting: Cardiology

## 2018-07-26 DIAGNOSIS — I4891 Unspecified atrial fibrillation: Secondary | ICD-10-CM

## 2018-07-26 NOTE — Telephone Encounter (Signed)
Reviewed with Joanell RisingAndy Tillery,PA Will refer to Afib clinic for ER follow up and schedule a routine follow up in the CHF clinic

## 2018-07-26 NOTE — Telephone Encounter (Signed)
-----   Message from Darrol Jumphonda G Barrett, PA-C sent at 07/23/2018  5:50 PM EST ----- Pt came in w/ rapid atrial fib. Started back on amio and DCCV, d/c home Needs appt in CHF clinic ASAP. Thanks RGB

## 2018-07-30 ENCOUNTER — Encounter (HOSPITAL_COMMUNITY): Payer: Self-pay | Admitting: Nurse Practitioner

## 2018-07-30 ENCOUNTER — Ambulatory Visit (HOSPITAL_COMMUNITY)
Admission: RE | Admit: 2018-07-30 | Discharge: 2018-07-30 | Disposition: A | Discharge status: Home / Self Care | Payer: Medicare Other | Source: Ambulatory Visit | Attending: Nurse Practitioner | Admitting: Nurse Practitioner

## 2018-07-30 VITALS — BP 136/70 | HR 47 | Ht 71.0 in | Wt 223.0 lb

## 2018-07-30 DIAGNOSIS — I4819 Other persistent atrial fibrillation: Secondary | ICD-10-CM | POA: Diagnosis not present

## 2018-07-30 DIAGNOSIS — I2510 Atherosclerotic heart disease of native coronary artery without angina pectoris: Secondary | ICD-10-CM | POA: Insufficient documentation

## 2018-07-30 DIAGNOSIS — Z951 Presence of aortocoronary bypass graft: Secondary | ICD-10-CM | POA: Diagnosis not present

## 2018-07-30 DIAGNOSIS — I480 Paroxysmal atrial fibrillation: Secondary | ICD-10-CM | POA: Diagnosis not present

## 2018-07-30 DIAGNOSIS — I5042 Chronic combined systolic (congestive) and diastolic (congestive) heart failure: Secondary | ICD-10-CM | POA: Diagnosis not present

## 2018-07-30 DIAGNOSIS — Z7901 Long term (current) use of anticoagulants: Secondary | ICD-10-CM | POA: Insufficient documentation

## 2018-07-30 DIAGNOSIS — Z8249 Family history of ischemic heart disease and other diseases of the circulatory system: Secondary | ICD-10-CM | POA: Insufficient documentation

## 2018-07-30 DIAGNOSIS — I4892 Unspecified atrial flutter: Secondary | ICD-10-CM | POA: Insufficient documentation

## 2018-07-30 DIAGNOSIS — I252 Old myocardial infarction: Secondary | ICD-10-CM | POA: Insufficient documentation

## 2018-07-30 DIAGNOSIS — Z87891 Personal history of nicotine dependence: Secondary | ICD-10-CM | POA: Insufficient documentation

## 2018-07-30 DIAGNOSIS — I4891 Unspecified atrial fibrillation: Secondary | ICD-10-CM | POA: Diagnosis not present

## 2018-07-30 DIAGNOSIS — I110 Hypertensive heart disease with heart failure: Secondary | ICD-10-CM | POA: Insufficient documentation

## 2018-07-30 DIAGNOSIS — I081 Rheumatic disorders of both mitral and tricuspid valves: Secondary | ICD-10-CM | POA: Diagnosis not present

## 2018-07-30 DIAGNOSIS — I255 Ischemic cardiomyopathy: Secondary | ICD-10-CM | POA: Insufficient documentation

## 2018-07-30 DIAGNOSIS — E785 Hyperlipidemia, unspecified: Secondary | ICD-10-CM | POA: Diagnosis not present

## 2018-07-30 DIAGNOSIS — Z79899 Other long term (current) drug therapy: Secondary | ICD-10-CM | POA: Diagnosis not present

## 2018-07-30 MED ORDER — METOPROLOL SUCCINATE ER 25 MG PO TB24: 50.0000 mg | ORAL_TABLET | Freq: Two times a day (BID) | ORAL | 6 refills | Status: DC

## 2018-07-30 NOTE — Progress Notes (Signed)
Primary Care Physician: Noni Saupeedding, [name], MD Referring Physician: ER f/u   [name] is a 72 y.o. male with a h/o afib on qpixaban, CHF/cardiomyopathy,CAD, s/p CABG x 5,  that was in the ER 07/23/18 for tachycardia, and found to be in atrial flutter. He had an URI and was in the PCP's office earlier that day when the rapid HR was found. He was advised to go to the ER. He reports having a successful cardioversion in March of this year and was taken off amiodarone at that time. He was seen in consult by Bjorn Loserhonda Barrett/Dr. Duke Salviaandolph . He was given IV metoprolol and Cardizem but became hypotensive. He had successful cardioversion and he was reloaded on amiodarone as short term therapy . He was not showing any signs of heart failure.   He is currently in sinus brady at 47 bpm. Feels improved. Is loading on amiodarone 200 mg bid x one week, then 200 mg a day. Marland Kitchen. His recent URI is much improved.He dopes have OSA, but does not wish to use CPAP.  Today, he denies symptoms of palpitations, chest pain, shortness of breath, orthopnea, PND, lower extremity edema, dizziness, presyncope, syncope, or neurologic sequela. The patient is tolerating medications without difficulties and is otherwise without complaint today.   Past Medical History:  Diagnosis Date  . Atrial flutter with rapid ventricular response (HCC) 07/23/2018  . CAD (coronary artery disease)    a. s/p CABG 2003  b. s/p acute MI prior to CABG w/ EF <30%  . Cardiomyopathy, ischemic: EF 20-25%   . Cholecystitis   . Chronic combined systolic and diastolic CHF (congestive heart failure) (HCC)   . Chronic systolic CHF (congestive heart failure) (HCC)    a. 2D ECHO: 05/17/2014: EF 30-35%;  b. 07/2015 TEE: EF 20-25%, diff HK, sev MR, sev dil LA w/o thrombus, mod reduced RV fxn, mod dil RA, mod TR.  Marland Kitchen. Hyperlipidemia   . Hypertensive heart disease   . Kidney stones X 1   "passed it" (08/02/2015)  . OSA (obstructive sleep apnea) 10/11/2014   refuses to use CPAP  . Persistent atrial fibrillation    a. s/p succesful TEE/DCCV on 06/01/14.  b. on Eliquis;  c. 07/2015 recurrent AF-->Failed DCCV.  . S/P CABG x 5 02/16/2002   LIMA to LAD, SVG to D1, SVG to OM2, Sequential SVG to PDA-RPL, saphenous vein harvest from right thigh and lower leg  . Severe mitral regurgitation    Functional, improved with improvement in his EF.  . Tricuspid regurgitation    Past Surgical History:  Procedure Laterality Date  . CARDIAC CATHETERIZATION N/A 06/19/2014   Procedure: RIGHT/LEFT HEART CATH AND CORONARY/GRAFT ANGIOGRAPHY;  Surgeon: Lennette Biharihomas A Kelly, MD;  [facility];  Service: Cardiovascular;  Laterality: N/A;  . CARDIAC CATHETERIZATION  2003  . CARDIAC CATHETERIZATION N/A 08/28/2015   Procedure: Right Heart Cath and Coronary/Graft Angiography;  Surgeon: Laurey Moralealton S McLean, MD;  [facility];  Service: Cardiovascular;  Laterality: N/A;  . CARDIOVERSION N/A 06/01/2014   Procedure: CARDIOVERSION;  Surgeon: Lars MassonKatarina H Nelson, MD;  [facility];  Service: Cardiovascular;  Laterality: N/A;  . CARDIOVERSION  06/11/2015; 08/02/2015  . CARDIOVERSION N/A 08/02/2015   Procedure: CARDIOVERSION;  Surgeon: Wendall StadePeter C Nishan, MD;  [facility];  Service: Cardiovascular;  Laterality: N/A;  . CARDIOVERSION N/A 10/22/2015   Procedure: CARDIOVERSION;  Surgeon: Laurey Moralealton S McLean, MD;  [facility];  Service: Cardiovascular;  Laterality: N/A;  .  CORONARY ARTERY BYPASS GRAFT  02/16/2002   CABG X5  . LAPAROSCOPIC APPENDECTOMY N/A 07/20/2016   Procedure: APPENDECTOMY LAPAROSCOPIC;  Surgeon: Manus RuddMatthew Tsuei, MD;  Location: [facility] OR;  Service: General;  Laterality: N/A;  . TEE WITHOUT CARDIOVERSION N/A 06/01/2014   Procedure: TRANSESOPHAGEAL ECHOCARDIOGRAM (TEE);  Surgeon: Lars MassonKatarina H Nelson, MD;  [facility];  Service: Cardiovascular;  Laterality: N/A;  . TEE WITHOUT CARDIOVERSION N/A 08/03/2015   Procedure:  TRANSESOPHAGEAL ECHOCARDIOGRAM (TEE);  Surgeon: Lars MassonKatarina H Nelson, MD;  [facility];  Service: Cardiovascular;  Laterality: N/A;    Current Outpatient Medications  Medication Sig Dispense Refill  . acetaminophen (TYLENOL) 325 MG tablet Take 325-650 mg by mouth every 8 (eight) hours as needed (for pain or headaches).    Marland Kitchen. amiodarone (PACERONE) 200 MG tablet Take 1 tablet (200 mg total) by mouth 2 (two) times daily. Take 1 tab bid x 2 weeks, then 1 tab daily. 60 tablet 6  . apixaban (ELIQUIS) 5 MG TABS tablet Take 1 tablet (5 mg total) by mouth 2 (two) times daily. 60 tablet 5  . ENTRESTO 24-26 MG TAKE 1 TABLET BY MOUTH TWICE DAILY (Patient taking differently: Take 1 tablet by mouth 2 (two) times daily. ) 60 tablet 0  . furosemide (LASIX) 20 MG tablet Take 2 tablets (40 mg total) by mouth as needed (for 3 lb weight gain over night or 5 lb weight gain in a week). 90 tablet 6  . isosorbide mononitrate (IMDUR) 30 MG 24 hr tablet TAKE 1 TABLET(30 MG) BY MOUTH DAILY (Patient taking differently: Take 30 mg by mouth daily. ) 30 tablet 5  . metoprolol succinate (TOPROL-XL) 25 MG 24 hr tablet Take 2 tablets (50 mg total) by mouth 2 (two) times daily. 180 tablet 6  . rosuvastatin (CRESTOR) 40 MG tablet Take 1 tablet (40 mg total) by mouth daily. (Patient taking differently: Take 40 mg by mouth at bedtime. ) 90 tablet 3  . spironolactone (ALDACTONE) 25 MG tablet Take 1 tablet (25 mg total) by mouth daily. 30 tablet 5   No current facility-administered medications for this encounter.     No Known Allergies  Social History   Socioeconomic History  . Marital status: Married    Spouse name: Not on file  . Number of children: Not on file  . Years of education: Not on file  . Highest education level: Not on file  Occupational History  . Occupation: Retired  Engineer, productionocial Needs  . Financial resource strain: Not on file  . Food insecurity:    Worry: Not on file    Inability: Not on file  .  Transportation needs:    Medical: Not on file    Non-medical: Not on file  Tobacco Use  . Smoking status: Former Smoker    Packs/day: 1.00    Years: 40.00    Pack years: 40.00    Types: Cigarettes    Last attempt to quit: 02/02/2002    Years since quitting: 16.4  . Smokeless tobacco: Never Used  Substance and Sexual Activity  . Alcohol use: Yes    Comment: 08/02/2015 "I rarely have a beer"  . Drug use: No  . Sexual activity: Yes  Lifestyle  . Physical activity:    Days per week: Not on file    Minutes per session: Not on file  . Stress: Not on file  Relationships  . Social connections:    Talks on phone: Not on file    Gets together: Not  on file    Attends religious service: Not on file    Active member of club or organization: Not on file    Attends meetings of clubs or organizations: Not on file    Relationship status: Not on file  . Intimate partner violence:    Fear of current or ex partner: Not on file    Emotionally abused: Not on file    Physically abused: Not on file    Forced sexual activity: Not on file  Other Topics Concern  . Not on file  Social History Narrative   Lives in [place] with wife.    Family History  Problem Relation Age of Onset  . Cancer Mother   . Cancer Father   . Hypertension Neg Hx   . Stroke Neg Hx     ROS- All systems are reviewed and negative except as per the HPI above  Physical Exam: Vitals:   07/30/18 1159  BP: 136/70  Pulse: (!) 47  Weight: 101.2 kg  Height: 5\' 11"  (1.803 m)   Wt Readings from Last 3 Encounters:  07/30/18 101.2 kg  07/23/18 94.3 kg  03/19/18 98.4 kg    Labs: Lab Results  Component Value Date   NA 138 07/23/2018   K 4.6 07/23/2018   CL 103 07/23/2018   CO2 22 07/23/2018   GLUCOSE 128 (H) 07/23/2018   BUN 26 (H) 07/23/2018   CREATININE 1.49 (H) 07/23/2018   CALCIUM 9.2 07/23/2018   MG 2.2 08/04/2015   Lab Results  Component Value Date   INR 1.28 08/24/2015   Lab Results  Component  Value Date   CHOL 122 03/19/2018   HDL 37 (L) 03/19/2018   LDLCALC 45 03/19/2018   TRIG 202 (H) 03/19/2018     GEN- The patient is well appearing, alert and oriented x 3 today.   Head- normocephalic, atraumatic Eyes-  Sclera clear, conjunctiva pink Ears- hearing intact Oropharynx- clear Neck- supple, no JVP Lymph- no cervical lymphadenopathy Lungs- Clear to ausculation bilaterally, normal work of breathing Heart- Slow regular rate and rhythm, no murmurs, rubs or gallops, PMI not laterally displaced GI- soft, NT, ND, + BS Extremities- no clubbing, cyanosis, or edema MS- no significant deformity or atrophy Skin- no rash or lesion Psych- euthymic mood, full affect Neuro- strength and sensation are intact  EKG-sinus brady at 47 bpm, PR int 194 ms, qrs int 100 ms, qtc 412 ms    Assessment and Plan: 1. Paroxysmal afib  S/p successful cardioversion 12/20 and reloading of amiodarone He will continue on amiodarone at 200 mg bid and then reduce to 200 mg daily He will reduce his metoprolol  to 50 mg bid from 75 mg bid 2/2 bradycardia   Per Dr. Leonides Sake note, she anticipated  Amiodarone  as short term He may be an ablation candidate if it is feared he may not stay in SR off amiodarone He will continue his use of apixaban 5 mg bid  for CHA2DS2VASc score of 4.  F/u in CHF clinic 1/16 as scheduled  Lupita Leash C. [facility] [address] [place], [place] [address] [phone]

## 2018-07-30 NOTE — Patient Instructions (Signed)
Decrease the metoprolol to 50 mg twice a day, this is 2 tablets.   Please keep follow up with Dr. Shirlee LatchMcLean

## 2018-08-10 ENCOUNTER — Other Ambulatory Visit: Payer: Self-pay | Admitting: Cardiology

## 2018-08-19 ENCOUNTER — Encounter (HOSPITAL_COMMUNITY): Payer: Self-pay

## 2018-08-19 ENCOUNTER — Other Ambulatory Visit: Payer: Self-pay

## 2018-08-19 ENCOUNTER — Ambulatory Visit (HOSPITAL_COMMUNITY)
Admission: RE | Admit: 2018-08-19 | Discharge: 2018-08-19 | Disposition: A | Discharge status: Home / Self Care | Payer: Medicare Other | Source: Ambulatory Visit | Attending: Internal Medicine | Admitting: Internal Medicine

## 2018-08-19 VITALS — BP 146/78 | HR 61 | Wt 223.4 lb

## 2018-08-19 DIAGNOSIS — Z79899 Other long term (current) drug therapy: Secondary | ICD-10-CM | POA: Diagnosis not present

## 2018-08-19 DIAGNOSIS — N183 Chronic kidney disease, stage 3 unspecified: Secondary | ICD-10-CM

## 2018-08-19 DIAGNOSIS — R001 Bradycardia, unspecified: Secondary | ICD-10-CM | POA: Insufficient documentation

## 2018-08-19 DIAGNOSIS — Z7901 Long term (current) use of anticoagulants: Secondary | ICD-10-CM | POA: Diagnosis not present

## 2018-08-19 DIAGNOSIS — I255 Ischemic cardiomyopathy: Secondary | ICD-10-CM | POA: Diagnosis not present

## 2018-08-19 DIAGNOSIS — G4733 Obstructive sleep apnea (adult) (pediatric): Secondary | ICD-10-CM | POA: Diagnosis not present

## 2018-08-19 DIAGNOSIS — I252 Old myocardial infarction: Secondary | ICD-10-CM | POA: Diagnosis not present

## 2018-08-19 DIAGNOSIS — I4819 Other persistent atrial fibrillation: Secondary | ICD-10-CM | POA: Insufficient documentation

## 2018-08-19 DIAGNOSIS — I2510 Atherosclerotic heart disease of native coronary artery without angina pectoris: Secondary | ICD-10-CM | POA: Insufficient documentation

## 2018-08-19 DIAGNOSIS — Z951 Presence of aortocoronary bypass graft: Secondary | ICD-10-CM | POA: Diagnosis not present

## 2018-08-19 DIAGNOSIS — I340 Nonrheumatic mitral (valve) insufficiency: Secondary | ICD-10-CM | POA: Diagnosis not present

## 2018-08-19 DIAGNOSIS — I5022 Chronic systolic (congestive) heart failure: Secondary | ICD-10-CM | POA: Insufficient documentation

## 2018-08-19 DIAGNOSIS — I130 Hypertensive heart and chronic kidney disease with heart failure and stage 1 through stage 4 chronic kidney disease, or unspecified chronic kidney disease: Secondary | ICD-10-CM | POA: Insufficient documentation

## 2018-08-19 DIAGNOSIS — Z87891 Personal history of nicotine dependence: Secondary | ICD-10-CM | POA: Diagnosis not present

## 2018-08-19 DIAGNOSIS — I5042 Chronic combined systolic (congestive) and diastolic (congestive) heart failure: Secondary | ICD-10-CM | POA: Diagnosis not present

## 2018-08-19 DIAGNOSIS — I480 Paroxysmal atrial fibrillation: Secondary | ICD-10-CM | POA: Diagnosis not present

## 2018-08-19 NOTE — Patient Instructions (Signed)
STOP Amiodarone  Your physician recommends that you schedule a follow-up appointment in: May with Dr. Shirlee Latch and an ECHO  Your physician has requested that you have an echocardiogram. Echocardiography is a painless test that uses sound waves to create images of your heart. It provides your doctor with information about the size and shape of your heart and how well your heart's chambers and valves are working. This procedure takes approximately one hour. There are no restrictions for this procedure.

## 2018-08-19 NOTE — Progress Notes (Signed)
Patient ID: Stanley Wells, male   DOB: 06/27/1946, 73 y.o.   MRN: [id number] PCP: Dr. Jeanie Sewer Cardiology: Dr. Shirlee Latch  73 y.o.with history of CAD, ischemic cardiomyopathy/chronic systolic CHF, severe functional mitral regurgitation, and persistent atrial fibrillation presents for followup of CHF and mitral regurgitation.  Patient had MI then CABG in 2003.  Cardiac cath in 2015 showed only LIMA-LAD and SVG-OM patent.  In 2015, EF was 30-35%.  He had atrial flutter in 2015 and again in 11/16, both times was cardioverted.  After the 11/16 cardioversion, he stopped all his meds.  He was admitted again in 12/16 with atrial fibrillation/RVR and a CHF exacerbation.  He was diuresed and started on Eliquis with plan for DCCV after 4 weeks of anticoagulation.  He was brought back at the end of December for cardioversion, but this was unsuccessful.  TEE in 12/16 showed EF 20-25% with severe functional MR.    He was seen by Dr [name] with plan for tentative plan for mitral valve repair versus replacement and possible tricuspid valve repair along with MAZE procedure.  He was referred to CHF clinic for optimization prior to surgery.   He went for left and right heart cath. RHC showed good left and right heart filling pressures but cardiac output was low.  Coronary angiography was stable with patent LIMA-LAD and SVG-OM.  Other SVGs occluded.    He had DCCV in 3/17 and is in NSR today on amiodarone.  He had a CPX in 3/17 with only mild functional impairment from CHF.    He had appendicitis with appendectomy in 12/17.   Echo in 5/18 showed EF 40-45% (stable) with trivial MR.  Echo was done today, EF up to 55-60% with no significant MR.   ECHo 12/2017 EF 55-60% No MR   Evaluated in [facility] in 07/23/2018. He was back in A fib and underwent DC-CV. A fib was thought to be triggered by URI. He was put back on amiodarone.  He had follow up in the A fib clinic. Amiodarone and Toprol XL was cut back.   Today he returns for  HF follow up. Overall feeling fine. Denies SOB/PND/Orthopnea. No chest pain. No bleeding problems.  Appetite ok. No fever or chills. Taking all medications.     Labs (9/16): LDL 126, HDL 38 Labs (12/16): K 4, creatinine 1.15 Labs (1/17): K 4.4, creatinine 1.09, LDL 42, HDL 39 Labs (10/01/2015): K 4.5 Creatinine 1.27 Dig level 0.4  Labs (4/17): digoxin 0.4, LFTs normal, TSH normal Labs (5/17): K 4.5, creatinine 1.36 Labs (6/17): K 5, creatinine 1.88, BNP 88, LDL 54, HDl 39, digoxin 0.8, LFTs normal, TSH normal Labs (9/17): K 4.8, creatinine 1.3, digoxin 0.6, TSH normal, LFTs normal Labs (12/17): K 3.2, creatinine 1.33, digoxin 0.3 Labs (7/18): K 4.8, digoxin 0.2, creatinine 1.22 Labs (10/18): LDL 41, HDL 39, digoxin 0.3 Labs (0/51): K 4.8, creatinine 1.56, hgb 13.9 Labs (4/19): digoxin 0.5, K 4.6, creatinine 1.37 Labs (07/23/2018): K 4.6 Creatinine 1.49  PMH: 1. CAD: s/p MI in 2003.  CABG x 5 in 2003 with LIMA-LAD, SVG-D1, SVG-OM2, seq SVG-PDA/PLV.  LHC/RHC 11/15 with native arteries essentially occluded.  SVG-D1 and seq SVG-PDA/PLV occluded, LIMA and SVG-OM patent.  LHC (1/17) with patent LIMA-LAD, occluded SVG-D, patent SVG-OM => SVG touches down on inferior branch of OM1 that backfills to superior branch of OM1, there are serial 70-80% stenoses in the proximal inferior OM1 branch that may compromise backflow to superior branch OM1, SVG-PDA/PLV totally occluded, EF 25% .  2. Atrial flutter: DCCV 2015.  Recurred 11/16 with DCCV.   3. Chronic systolic CHF: Ischemic cardiomyopathy.  Echo (2015) with moderate-severe MR, EF 30-35%.  TEE (12/16) with severe central MR (functional), EF 20-25% with diffuse hypokinesis, moderately decreased RV systolic function, moderate TR.  RHC (1/17) with mean RA 5, PA 27/12 mean 18, mean PCWP 10, CI 1.7 Fick/1.83 thermo. EF 25% on 1/17 LV-gram.  Cardiac MRI (2/17) with EF 19%, dilated LV, images not adequate to assess delayed enhancement.  - CPX (3/17) with peak VO2  20.4, VE/VCO2 slope 34, RER 1.11 => mild functional impairment.  - Echo (5/17) with EF 40-45%, trivial MR, mild RV dilation with low normal RV systolic function.  - Echo (5/18) with EF 40-45%, basal to mid inferior HK, moderate LVH, trivial MR.  - Echo (8/19) with EF 55-60%, no significant MR.  4. Mitral regurgitation: Severe on 12/16 TEE, but was only trivial on echo in 5/17 and 5/18 with aggressive medical treatment. 8/19 echo with no significant MR.  5. OSA: Not using CPAP.  6. HTN 7. Nephrolithiasis 8. Atrial fibrillation: Noted in 12/16 initially.  Paroxysmal.  Had DCCV to NSR in 3/17.  9. PFTs (1/17) with minimal obstructive airways disease but severely decreased DLCO (37% predicted), possibly suggestive of pulmonary vascular disease.  10. ABIs (5/17) were normal.  11. CKD 12. Appendectomy 12/17.  SH: Prior smoker quit 2013, lives in [place], retired Art gallery manager, married.   FH: No premature CAD.  ROS: All systems reviewed and negative except as per HPI.   Current Outpatient Medications  Medication Sig Dispense Refill  . amiodarone (PACERONE) 200 MG tablet Take 1 tablet (200 mg total) by mouth 2 (two) times daily. Take 1 tab bid x 2 weeks, then 1 tab daily. 60 tablet 6  . apixaban (ELIQUIS) 5 MG TABS tablet Take 1 tablet (5 mg total) by mouth 2 (two) times daily. 60 tablet 5  . ENTRESTO 24-26 MG TAKE 1 TABLET BY MOUTH TWICE DAILY 60 tablet 3  . furosemide (LASIX) 20 MG tablet Take 2 tablets (40 mg total) by mouth as needed (for 3 lb weight gain over night or 5 lb weight gain in a week). 90 tablet 6  . isosorbide mononitrate (IMDUR) 30 MG 24 hr tablet TAKE 1 TABLET(30 MG) BY MOUTH DAILY (Patient taking differently: Take 30 mg by mouth daily. ) 30 tablet 5  . metoprolol succinate (TOPROL-XL) 25 MG 24 hr tablet Take 2 tablets (50 mg total) by mouth 2 (two) times daily. 180 tablet 6  . rosuvastatin (CRESTOR) 40 MG tablet Take 1 tablet (40 mg total) by mouth daily. (Patient taking  differently: Take 40 mg by mouth at bedtime. ) 90 tablet 3  . spironolactone (ALDACTONE) 25 MG tablet Take 1 tablet (25 mg total) by mouth daily. 30 tablet 5   No current facility-administered medications for this encounter.    BP (!) 146/78   Pulse 61   Wt 101.3 kg (223 lb 6.4 oz)   SpO2 96%   BMI 31.16 kg/m   Wt Readings from Last 3 Encounters:  08/19/18 101.3 kg (223 lb 6.4 oz)  07/30/18 101.2 kg (223 lb)  07/23/18 94.3 kg (208 lb)    General:  Well appearing. No resp difficulty HEENT: normal Neck: supple. no JVD. Carotids 2+ bilat; no bruits. No lymphadenopathy or thryomegaly appreciated. Cor: PMI nondisplaced. Regular rate & rhythm. No rubs, gallops or murmurs. Lungs: clear Abdomen: soft, nontender, nondistended. No hepatosplenomegaly. No bruits or masses.  Good bowel sounds. Extremities: no cyanosis, clubbing, rash, edema Neuro: alert & orientedx3, cranial nerves grossly intact. moves all 4 extremities w/o difficulty. Affect pleasant  EKG: Sinus Brady  56 bpm   Assessment/Plan: 1. Chronic systolic CHF: EF 16-10%20-25% on TEE from 12/16.  cMRI 2/17 with EF 19%, unable to assess for delayed enhancement on this study.  Ischemic cardiomyopathy.  Low cardiac output by RHC in 1/17 but filling pressures optimized.  Despite low output on that RHC, he has been doing quite well with medical treatment.  CPX (3/17) with only mild functional limitation.   ECHO 12/2017 showed EF up to 55-60%. Repeat ECHO in May  -NYHA II. Vollume status stable.  - He has not needed Lasix.   - Continue current Toprol XL, Entresto, and spironolactone. BMET today and again in 3 months.     2. CAD: s/p CABG.  On 1/17 cath, only LIMA-LAD and SVG-OM2 still patent.  No s/s ischemia.  - No ASA given use of apixaban.   - Continue Crestor.   3. Atrial fibrillation: Paroxysmal.  Successful DCCV in 3/17 on amiodarone and again in 12/19.  He is maintaining NSR. Stop amiodarone today. If he has recurrent amiodarone will  need to consider ablation.  - Continue apixaban.  4. Mitral regurgitation: Severe by 12/16 TEE, probably functional.  Much improved on 5/17 echo, only trivial.  Trivial on 5/18 echo. No significant MR on echo 12/2017   5. CKD: Stage III.  I reviewed BMET from 07/23/2018. Creatinine was stable.    Follow up in 3-4  months with an ECHO and Dr Shirlee LatchMcLean .  Greater than 50% of the (total minutes 25) visit spent in counseling/coordination of care regarding the above and included EKG, ECHO, and medication changes.     Emmamarie Kluender NP-C  08/19/2018

## 2018-08-27 ENCOUNTER — Telehealth (HOSPITAL_COMMUNITY): Payer: Self-pay | Admitting: Licensed Clinical Social Worker

## 2018-08-27 NOTE — Telephone Encounter (Signed)
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

## 2018-12-06 ENCOUNTER — Ambulatory Visit (HOSPITAL_COMMUNITY)
Admission: RE | Admit: 2018-12-06 | Discharge: 2018-12-06 | Disposition: A | Discharge status: Home / Self Care | Payer: Medicare Other | Source: Ambulatory Visit | Attending: Cardiology | Admitting: Cardiology

## 2018-12-06 ENCOUNTER — Other Ambulatory Visit: Payer: Self-pay

## 2018-12-06 DIAGNOSIS — I5042 Chronic combined systolic (congestive) and diastolic (congestive) heart failure: Secondary | ICD-10-CM | POA: Diagnosis not present

## 2018-12-06 MED ORDER — SPIRONOLACTONE 25 MG PO TABS: 25.0000 mg | ORAL_TABLET | Freq: Every day | ORAL | 3 refills | Status: DC

## 2018-12-06 MED ORDER — FUROSEMIDE 20 MG PO TABS: 40.0000 mg | ORAL_TABLET | ORAL | 6 refills | Status: DC | PRN

## 2018-12-06 MED ORDER — SACUBITRIL-VALSARTAN 24-26 MG PO TABS: 1.0000 | ORAL_TABLET | Freq: Two times a day (BID) | ORAL | 3 refills | Status: DC

## 2018-12-06 MED ORDER — ROSUVASTATIN CALCIUM 40 MG PO TABS: 40.0000 mg | ORAL_TABLET | Freq: Every day | ORAL | 3 refills | Status: DC

## 2018-12-06 MED ORDER — ISOSORBIDE MONONITRATE ER 30 MG PO TB24: ORAL_TABLET | ORAL | 3 refills | Status: DC

## 2018-12-06 NOTE — Patient Instructions (Addendum)
All medication have been refilled.  Your physician has requested that you have an echocardiogram. Echocardiography is a painless test that uses sound waves to create images of your heart. It provides your doctor with information about the size and shape of your heart and how well your heart's chambers and valves are working. This procedure takes approximately one hour. There are no restrictions for this procedure.  Your ECHO and follow up appointment will be on the same day, 14 July. Your ECHO will be at 1pm and your follow up appointment with Dr Shirlee Latch will be at 2pm. The garage code for July will be 9009.  A prescription has been included for you to take to your primary care office to get labs drawn.   Please call the office at [phone], option 2 for the nurses, with any questions or concerns.

## 2018-12-07 ENCOUNTER — Encounter (HOSPITAL_COMMUNITY): Payer: Medicare Other | Admitting: Cardiology

## 2018-12-07 ENCOUNTER — Ambulatory Visit (HOSPITAL_COMMUNITY): Payer: Medicare Other

## 2018-12-07 NOTE — Progress Notes (Signed)
Heart Failure TeleHealth Note  Due to national recommendations of social distancing due to COVID 19, Audio/video telehealth visit is felt to be most appropriate for this patient at this time.  See MyChart message from today for patient consent regarding telehealth for [facility].  Date:  12/07/2018   ID:  Stanley Wells, DOB 04-07-1946, MRN [id number]  Location: Home  Provider location: [place] [facility] Type of Visit: Established patient   PCP:  [name], MD  Cardiologist: Dr. Shirlee Latch  Chief Complaint: Atrial flutter   History of Present Illness: Stanley Wells is a 73 y.o. male who presents via audio/video conferencing for a telehealth visit today.     he denies symptoms worrisome for COVID 19.   Patient has a history of CAD, ischemic cardiomyopathy/chronic systolic CHF, severe functional mitral regurgitation, and persistent atrial fibrillation.  Patient had MI then CABG in 2003.  Cardiac cath in 2015 showed only LIMA-LAD and SVG-OM patent.  In 2015, EF was 30-35%.  He had atrial flutter in 2015 and again in 11/16, both times was cardioverted.  After the 11/16 cardioversion, he stopped all his meds.  He was admitted again in 12/16 with atrial fibrillation/RVR and a CHF exacerbation.  He was diuresed and started on Eliquis with plan for DCCV after 4 weeks of anticoagulation.  He was brought back at the end of December for cardioversion, but this was unsuccessful.  TEE in 12/16 showed EF 20-25% with severe functional MR.    He was seen by Dr [name] with plan for tentative plan for mitral valve repair versus replacement and possible tricuspid valve repair along with MAZE procedure.  He was referred to CHF clinic for optimization prior to surgery.   He went for left and right heart cath. RHC showed good left and right heart filling pressures but cardiac output was low.  Coronary angiography was stable with patent LIMA-LAD and SVG-OM.  Other SVGs occluded.     He had DCCV in 3/17 and is in NSR today on amiodarone.  He had a CPX in 3/17 with only mild functional impairment from CHF.    He had appendicitis with appendectomy in 12/17.   Echo in 5/18 showed EF 40-45% (stable) with trivial MR.  Echo in 5/19 showed EF up to 55-60% with no significant MR.   Evaluated in [facility] in 07/23/2018. He was in atrial flutter in the setting of a URI and underwent DC-CV.  He was put back on amiodarone.  Amiodarone later stopped.   No complaints today. No palpitations.  HR has been in 60s generally when he checks.  No lightheadedness.  No significant exertional dyspnea, able to do yardwork without much problem.  No chest pain.  No orthopnea/PND.     Labs (9/16): LDL 126, HDL 38 Labs (12/16): K 4, creatinine 1.15 Labs (1/17): K 4.4, creatinine 1.09, LDL 42, HDL 39 Labs (10/01/2015): K 4.5 Creatinine 1.27 Dig level 0.4  Labs (4/17): digoxin 0.4, LFTs normal, TSH normal Labs (5/17): K 4.5, creatinine 1.36 Labs (6/17): K 5, creatinine 1.88, BNP 88, LDL 54, HDl 39, digoxin 0.8, LFTs normal, TSH normal Labs (9/17): K 4.8, creatinine 1.3, digoxin 0.6, TSH normal, LFTs normal Labs (12/17): K 3.2, creatinine 1.33, digoxin 0.3 Labs (7/18): K 4.8, digoxin 0.2, creatinine 1.22 Labs (10/18): LDL 41, HDL 39, digoxin 0.3 Labs (2/67): K 4.8, creatinine 1.56, hgb 13.9 Labs (4/19): digoxin 0.5, K 4.6, creatinine 1.37 Labs (07/23/2018): K 4.6 Creatinine  1.49, hgb 13.8  PMH: 1. CAD: s/p MI in 2003.  CABG x 5 in 2003 with LIMA-LAD, SVG-D1, SVG-OM2, seq SVG-PDA/PLV.  LHC/RHC 11/15 with native arteries essentially occluded.  SVG-D1 and seq SVG-PDA/PLV occluded, LIMA and SVG-OM patent.  LHC (1/17) with patent LIMA-LAD, occluded SVG-D, patent SVG-OM => SVG touches down on inferior branch of OM1 that backfills to superior branch of OM1, there are serial 70-80% stenoses in the proximal inferior OM1 branch that may compromise backflow to superior branch OM1, SVG-PDA/PLV totally  occluded, EF 25% . 2. Atrial flutter: DCCV 2015.  Recurred 11/16 with DCCV.   3. Chronic systolic CHF: Ischemic cardiomyopathy.  Echo (2015) with moderate-severe MR, EF 30-35%.  TEE (12/16) with severe central MR (functional), EF 20-25% with diffuse hypokinesis, moderately decreased RV systolic function, moderate TR.  RHC (1/17) with mean RA 5, PA 27/12 mean 18, mean PCWP 10, CI 1.7 Fick/1.83 thermo. EF 25% on 1/17 LV-gram.  Cardiac MRI (2/17) with EF 19%, dilated LV, images not adequate to assess delayed enhancement.  - CPX (3/17) with peak VO2 20.4, VE/VCO2 slope 34, RER 1.11 => mild functional impairment.  - Echo (5/17) with EF 40-45%, trivial MR, mild RV dilation with low normal RV systolic function.  - Echo (5/18) with EF 40-45%, basal to mid inferior HK, moderate LVH, trivial MR.  - Echo (5/19) with EF 55-60%, no significant MR.  4. Mitral regurgitation: Severe on 12/16 TEE, but was only trivial on echo in 5/17 and 5/18 with aggressive medical treatment. 8/19 echo with no significant MR.  5. OSA: Not using CPAP.  6. HTN 7. Nephrolithiasis 8. Atrial fibrillation/flutter: Noted in 12/16 initially.  Paroxysmal.  Had DCCV to NSR in 3/17.  - Atrial flutter 12/19 => DCCV to NSR, briefly on amiodarone.  9. PFTs (1/17) with minimal obstructive airways disease but severely decreased DLCO (37% predicted), possibly suggestive of pulmonary vascular disease.  10. ABIs (5/17) were normal.  11. CKD 12. Appendectomy 12/17.   Current Outpatient Medications  Medication Sig Dispense Refill   apixaban (ELIQUIS) 5 MG TABS tablet Take 1 tablet (5 mg total) by mouth 2 (two) times daily. 60 tablet 5   furosemide (LASIX) 20 MG tablet Take 2 tablets (40 mg total) by mouth as needed (for 3 lb weight gain over night or 5 lb weight gain in a week). 90 tablet 6   isosorbide mononitrate (IMDUR) 30 MG 24 hr tablet TAKE 1 TABLET(30 MG) BY MOUTH DAILY 90 tablet 3   metoprolol succinate (TOPROL-XL) 25 MG 24 hr  tablet Take 2 tablets (50 mg total) by mouth 2 (two) times daily. 180 tablet 6   rosuvastatin (CRESTOR) 40 MG tablet Take 1 tablet (40 mg total) by mouth daily. 90 tablet 3   sacubitril-valsartan (ENTRESTO) 24-26 MG Take 1 tablet by mouth 2 (two) times daily. 180 tablet 3   spironolactone (ALDACTONE) 25 MG tablet Take 1 tablet (25 mg total) by mouth daily. 90 tablet 3   No current facility-administered medications for this encounter.     Allergies:   Patient has no known allergies.   Social History:  The patient  reports that he quit smoking about 16 years ago. His smoking use included cigarettes. He has a 40.00 pack-year smoking history. He has never used smokeless tobacco. He reports current alcohol use. He reports that he does not use drugs.   Family History:  The patient's family history includes Cancer in his father and mother.   ROS:  Please see  the history of present illness.   All other systems are personally reviewed and negative.   Exam:  (Video/Tele Health Call; Exam is subjective and or/visual.) BP 136/77, HR 69 General:  Speaks in full sentences. No resp difficulty. Lungs: Normal respiratory effort with conversation.  Abdomen: Non-distended per patient report Extremities: Pt denies edema. Neuro: Alert & oriented x 3.   Recent Labs: 07/23/2018: BUN 26; Creatinine, Ser 1.49; Hemoglobin 13.8; Platelets 227; Potassium 4.6; Sodium 138  Personally reviewed   Wt Readings from Last 3 Encounters:  08/19/18 101.3 kg (223 lb 6.4 oz)  07/30/18 101.2 kg (223 lb)  07/23/18 94.3 kg (208 lb)      ASSESSMENT AND PLAN:  1. Chronic systolic CHF: EF 16-10% on TEE from 12/16.  cMRI 2/17 with EF 19%, unable to assess for delayed enhancement on this study.  Ischemic cardiomyopathy.  Low cardiac output by RHC in 1/17 but filling pressures optimized.  Despite low output on that RHC, he has been doing quite well with medical treatment.  CPX (3/17) with only mild functional limitation.   Echo in 5/19 showed EF up to 55-60%. NYHA class II symptoms with stable weight. - He has not needed Lasix.   - Continue current Toprol XL, Entresto, and spironolactone. Arrange BMET today and will need every 3 months.   - Repeat echo at followup in 3 months to make sure that EF remains in normal range after improvement.  2. CAD: s/p CABG.  On 1/17 cath, only LIMA-LAD and SVG-OM2 still patent. No chest pain.  - No ASA given use of apixaban.   - Continue Crestor, check lipids with next labs.   3. Atrial fibrillation/flutter: Paroxysmal.  Successful DCCV in 3/17 on amiodarone.  Amiodarone stopped, then he had atrial flutter in 12/19 that was cardioverted back to NSR and amiodarone was restarted transiently.  He seems to be  maintaining NSR.   - Continue apixaban.  - Would like him to have fibrillation/flutter ablation if he has another recurrence over the short term.  4. Mitral regurgitation: Severe by 12/16 TEE, probably functional.  Much improved on 5/17 echo, only trivial.  Trivial on 5/18 echo. No significant MR on echo 12/2017   5. CKD: Stage III. I will arrange BMET.   COVID screen The patient does not have any symptoms that suggest any further testing/ screening at this time.  Social distancing reinforced today.  Patient Risk: After full review of this patients clinical status, I feel that they are at moderate risk for cardiac decompensation at this time.  Relevant cardiac medications were reviewed at length with the patient today. The patient does not have concerns regarding their medications at this time.   Recommended follow-up:  3 months with echo.   Today, I have spent 18 minutes with the patient with telehealth technology discussing the above issues .    Signed, Marca Ancona, MD  12/07/2018  [facility] [address] [facility] [place] [address] [phone] (office) [phone] (fax)

## 2018-12-11 ENCOUNTER — Other Ambulatory Visit (HOSPITAL_COMMUNITY): Payer: Self-pay | Admitting: Cardiology

## 2018-12-14 DIAGNOSIS — I5020 Unspecified systolic (congestive) heart failure: Secondary | ICD-10-CM | POA: Diagnosis not present

## 2018-12-17 ENCOUNTER — Other Ambulatory Visit (HOSPITAL_COMMUNITY): Payer: Self-pay | Admitting: Cardiology

## 2019-01-14 ENCOUNTER — Other Ambulatory Visit (HOSPITAL_COMMUNITY): Payer: Self-pay | Admitting: Cardiology

## 2019-02-11 ENCOUNTER — Other Ambulatory Visit: Payer: Self-pay

## 2019-02-11 ENCOUNTER — Ambulatory Visit (HOSPITAL_COMMUNITY)
Admission: RE | Admit: 2019-02-11 | Discharge: 2019-02-11 | Disposition: A | Discharge status: Home / Self Care | Payer: Medicare Other | Source: Ambulatory Visit | Attending: Cardiology | Admitting: Cardiology

## 2019-02-11 ENCOUNTER — Ambulatory Visit (HOSPITAL_BASED_OUTPATIENT_CLINIC_OR_DEPARTMENT_OTHER)
Admission: RE | Admit: 2019-02-11 | Discharge: 2019-02-11 | Disposition: A | Discharge status: Home / Self Care | Payer: Medicare Other | Source: Ambulatory Visit | Attending: Cardiology | Admitting: Cardiology

## 2019-02-11 VITALS — BP 130/78 | HR 62 | Wt 222.8 lb

## 2019-02-11 DIAGNOSIS — N183 Chronic kidney disease, stage 3 (moderate): Secondary | ICD-10-CM | POA: Insufficient documentation

## 2019-02-11 DIAGNOSIS — Z87442 Personal history of urinary calculi: Secondary | ICD-10-CM | POA: Diagnosis not present

## 2019-02-11 DIAGNOSIS — I480 Paroxysmal atrial fibrillation: Secondary | ICD-10-CM

## 2019-02-11 DIAGNOSIS — G4733 Obstructive sleep apnea (adult) (pediatric): Secondary | ICD-10-CM | POA: Insufficient documentation

## 2019-02-11 DIAGNOSIS — I340 Nonrheumatic mitral (valve) insufficiency: Secondary | ICD-10-CM | POA: Insufficient documentation

## 2019-02-11 DIAGNOSIS — G8929 Other chronic pain: Secondary | ICD-10-CM | POA: Diagnosis not present

## 2019-02-11 DIAGNOSIS — I252 Old myocardial infarction: Secondary | ICD-10-CM | POA: Insufficient documentation

## 2019-02-11 DIAGNOSIS — I4892 Unspecified atrial flutter: Secondary | ICD-10-CM | POA: Diagnosis not present

## 2019-02-11 DIAGNOSIS — Z87891 Personal history of nicotine dependence: Secondary | ICD-10-CM | POA: Insufficient documentation

## 2019-02-11 DIAGNOSIS — I255 Ischemic cardiomyopathy: Secondary | ICD-10-CM | POA: Insufficient documentation

## 2019-02-11 DIAGNOSIS — I5042 Chronic combined systolic (congestive) and diastolic (congestive) heart failure: Secondary | ICD-10-CM | POA: Diagnosis not present

## 2019-02-11 DIAGNOSIS — Z951 Presence of aortocoronary bypass graft: Secondary | ICD-10-CM

## 2019-02-11 DIAGNOSIS — I2510 Atherosclerotic heart disease of native coronary artery without angina pectoris: Secondary | ICD-10-CM | POA: Diagnosis not present

## 2019-02-11 DIAGNOSIS — M25552 Pain in left hip: Secondary | ICD-10-CM | POA: Diagnosis not present

## 2019-02-11 DIAGNOSIS — I130 Hypertensive heart and chronic kidney disease with heart failure and stage 1 through stage 4 chronic kidney disease, or unspecified chronic kidney disease: Secondary | ICD-10-CM | POA: Diagnosis not present

## 2019-02-11 DIAGNOSIS — I5022 Chronic systolic (congestive) heart failure: Secondary | ICD-10-CM | POA: Insufficient documentation

## 2019-02-11 DIAGNOSIS — M545 Low back pain: Secondary | ICD-10-CM | POA: Diagnosis not present

## 2019-02-11 DIAGNOSIS — Z7901 Long term (current) use of anticoagulants: Secondary | ICD-10-CM | POA: Insufficient documentation

## 2019-02-11 DIAGNOSIS — M25551 Pain in right hip: Secondary | ICD-10-CM | POA: Diagnosis not present

## 2019-02-11 DIAGNOSIS — Z79899 Other long term (current) drug therapy: Secondary | ICD-10-CM | POA: Insufficient documentation

## 2019-02-11 LAB — BASIC METABOLIC PANEL
Anion gap: 7 (ref 5–15)
BUN: 29 mg/dL — ABNORMAL HIGH (ref 8–23)
CO2: 19 mmol/L — ABNORMAL LOW (ref 22–32)
Calcium: 9.4 mg/dL (ref 8.9–10.3)
Chloride: 114 mmol/L — ABNORMAL HIGH (ref 98–111)
Creatinine, Ser: 1.3 mg/dL — ABNORMAL HIGH (ref 0.61–1.24)
GFR calc Af Amer: >60 mL/min (ref >60)
GFR calc non Af Amer: 55 mL/min — ABNORMAL LOW (ref >60)
Glucose, Bld: 115 mg/dL — ABNORMAL HIGH (ref 70–99)
Potassium: 4.6 mmol/L (ref 3.5–5.1)
Sodium: 140 mmol/L (ref 135–145)

## 2019-02-11 MED ORDER — SACUBITRIL-VALSARTAN 49-51 MG PO TABS: 1.0000 | ORAL_TABLET | Freq: Two times a day (BID) | ORAL | 6 refills | Status: DC

## 2019-02-11 NOTE — Patient Instructions (Signed)
INCREASE Entresto to 49/51mg  (1 tab) twice a day  Labs today  We will only contact you if something comes back abnormal or we need to make some changes. Otherwise no news is good news!   REPEAT Labs on Monday 02/21/19 at 9:30a  Your physician recommends that you schedule a follow-up appointment in: 4 months with Dr Aundra Dubin.  You will get a phone call to schedule this appointment.   At the [facility], you and your health needs are our priority. As part of our continuing mission to provide you with exceptional heart care, we have created designated Provider Care Teams. These Care Teams include your primary Cardiologist (physician) and Advanced Practice Providers (APPs- Physician Assistants and Nurse Practitioners) who all work together to provide you with the care you need, when you need it.   You may see any of the following providers on your designated Care Team at your next follow up: Marland Kitchen Dr Glori Bickers . Dr Loralie Champagne . Darrick Grinder, NP

## 2019-02-11 NOTE — Progress Notes (Signed)
  Echocardiogram 2D Echocardiogram has been performed.  Stanley Wells [name] 02/11/2019, 11:00 AM

## 2019-02-13 NOTE — Progress Notes (Signed)
Date:  02/13/2019   ID:  Stanley FreibergJames C Wells, DOB 01/12/1946, MRN [id number]   Provider location: [place] [facility] Type of Visit: Established patient   PCP:  Stanley Wells, Stanley F. II, MD  Cardiologist: Dr. Shirlee Wells  Chief Complaint: Atrial flutter   History of Present Illness: Stanley Wells is a 73 y.o. male who has a history of CAD, ischemic cardiomyopathy/chronic systolic CHF, severe functional mitral regurgitation, and persistent atrial fibrillation.  Patient had MI then CABG in 2003.  Cardiac cath in 2015 showed only LIMA-LAD and SVG-OM patent.  In 2015, EF was 30-35%.  He had atrial flutter in 2015 and again in 11/16, both times was cardioverted.  After the 11/16 cardioversion, he stopped all his meds.  He was admitted again in 12/16 with atrial fibrillation/RVR and a CHF exacerbation.  He was diuresed and started on Eliquis with plan for DCCV after 4 weeks of anticoagulation.  He was brought back at the end of December for cardioversion, but this was unsuccessful.  TEE in 12/16 showed EF 20-25% with severe functional MR.    He was seen by Dr Cornelius Moraswen with plan for tentative plan for mitral valve repair versus replacement and possible tricuspid valve repair along with MAZE procedure.  He was referred to CHF clinic for optimization prior to surgery.   He went for left and right heart cath. RHC showed good left and right heart filling pressures but cardiac output was low.  Coronary angiography was stable with patent LIMA-LAD and SVG-OM.  Other SVGs occluded.    He had DCCV in 3/17 and is in NSR today on amiodarone.  He had a CPX in 3/17 with only mild functional impairment from CHF.    He had appendicitis with appendectomy in 12/17.   Echo in 5/18 showed EF 40-45% (stable) with trivial MR.  Echo in 5/19 showed EF up to 55-60% with no significant MR.   Evaluated in [facility] in 07/23/2018. He was in atrial flutter in the setting of a URI and underwent DC-CV.  He was put back on  amiodarone.  Amiodarone later stopped.   Echo was done today and reviewed: EF 45-50% with septal bounce on my review with mild LV dilation, normal RV size and systolic function, IVC normal.   Patient presents for followup of CHF and CAD.  He is doing well generally.  No chest pain. No significant exertional dyspnea walking on flat ground or doing his usual activities including mowing his lawn.  No lightheadedness or palpitations.  No orthopnea/PND. He has chronic bilateral hip and lower back pain when he walks.   ECG (personally reviewed): NSR with PAC  Labs (9/16): LDL 126, HDL 38 Labs (12/16): K 4, creatinine 1.15 Labs (1/17): K 4.4, creatinine 1.09, LDL 42, HDL 39 Labs (10/01/2015): K 4.5 Creatinine 1.27 Dig level 0.4  Labs (4/17): digoxin 0.4, LFTs normal, TSH normal Labs (5/17): K 4.5, creatinine 1.36 Labs (6/17): K 5, creatinine 1.88, BNP 88, LDL 54, HDl 39, digoxin 0.8, LFTs normal, TSH normal Labs (9/17): K 4.8, creatinine 1.3, digoxin 0.6, TSH normal, LFTs normal Labs (12/17): K 3.2, creatinine 1.33, digoxin 0.3 Labs (7/18): K 4.8, digoxin 0.2, creatinine 1.22 Labs (10/18): LDL 41, HDL 39, digoxin 0.3 Labs (4/093/19): K 4.8, creatinine 1.56, hgb 13.9 Labs (4/19): digoxin 0.5, K 4.6, creatinine 1.37 Labs (07/23/2018): K 4.6 Creatinine 1.49, hgb 13.8 Labs (5/20): K 4.5, creatinine 1.4, LDL 39, hgb 13.5  PMH: 1. CAD: s/p MI in  2003.  CABG x 5 in 2003 with LIMA-LAD, SVG-D1, SVG-OM2, seq SVG-PDA/PLV.  LHC/RHC 11/15 with native arteries essentially occluded.  SVG-D1 and seq SVG-PDA/PLV occluded, LIMA and SVG-OM patent.  LHC (1/17) with patent LIMA-LAD, occluded SVG-D, patent SVG-OM => SVG touches down on inferior branch of OM1 that backfills to superior branch of OM1, there are serial 70-80% stenoses in the proximal inferior OM1 branch that may compromise backflow to superior branch OM1, SVG-PDA/PLV totally occluded, EF 25% . 2. Atrial flutter: DCCV 2015.  Recurred 11/16 with DCCV.   3.  Chronic systolic CHF: Ischemic cardiomyopathy.  Echo (2015) with moderate-severe MR, EF 30-35%.  TEE (12/16) with severe central MR (functional), EF 20-25% with diffuse hypokinesis, moderately decreased RV systolic function, moderate TR.  RHC (1/17) with mean RA 5, PA 27/12 mean 18, mean PCWP 10, CI 1.7 Fick/1.83 thermo. EF 25% on 1/17 LV-gram.  Cardiac MRI (2/17) with EF 19%, dilated LV, images not adequate to assess delayed enhancement.  - CPX (3/17) with peak VO2 20.4, VE/VCO2 slope 34, RER 1.11 => mild functional impairment.  - Echo (5/17) with EF 40-45%, trivial MR, mild RV dilation with low normal RV systolic function.  - Echo (5/18) with EF 40-45%, basal to mid inferior HK, moderate LVH, trivial MR.  - Echo (5/19) with EF 55-60%, no significant MR.  - Echo (7/20) with EF 45-50% on my review with mild LV dilation, normal RV size and systolic function, IVC normal, trivial MR.   4. Mitral regurgitation: Severe on 12/16 TEE, but was only trivial on echo in 5/17 and 5/18 with aggressive medical treatment. 5/19 echo with no significant MR. 7/20 echo with trivial MR.  5. OSA: Not using CPAP.  6. HTN 7. Nephrolithiasis 8. Atrial fibrillation/flutter: Noted in 12/16 initially.  Paroxysmal.  Had DCCV to NSR in 3/17.  - Atrial flutter 12/19 => DCCV to NSR, briefly on amiodarone.  9. PFTs (1/17) with minimal obstructive airways disease but severely decreased DLCO (37% predicted), possibly suggestive of pulmonary vascular disease.  10. ABIs (5/17) were normal.  11. CKD 12. Appendectomy 12/17.   Current Outpatient Medications  Medication Sig Dispense Refill   ELIQUIS 5 MG TABS tablet TAKE 1 TABLET(5 MG) BY MOUTH TWICE DAILY 60 tablet 5   furosemide (LASIX) 20 MG tablet Take 2 tablets (40 mg total) by mouth as needed (for 3 lb weight gain over night or 5 lb weight gain in a week). 90 tablet 6   isosorbide mononitrate (IMDUR) 30 MG 24 hr tablet TAKE 1 TABLET(30 MG) BY MOUTH DAILY 90 tablet 3    metoprolol succinate (TOPROL-XL) 25 MG 24 hr tablet Take 2 tablets (50 mg total) by mouth 2 (two) times daily. 180 tablet 6   rosuvastatin (CRESTOR) 40 MG tablet Take 1 tablet (40 mg total) by mouth daily. 90 tablet 3   spironolactone (ALDACTONE) 25 MG tablet TAKE 1 TABLET(25 MG) BY MOUTH DAILY 30 tablet 2   sacubitril-valsartan (ENTRESTO) 49-51 MG Take 1 tablet by mouth 2 (two) times daily. 60 tablet 6   No current facility-administered medications for this encounter.     Allergies:   Patient has no known allergies.   Social History:  The patient  reports that he quit smoking about 17 years ago. His smoking use included cigarettes. He has a 40.00 pack-year smoking history. He has never used smokeless tobacco. He reports current alcohol use. He reports that he does not use drugs.   Family History:  The patient's family history  includes Cancer in his father and mother.   ROS:  Please see the history of present illness.   All other systems are personally reviewed and negative.   Exam:   BP 130/78    Pulse 62    Wt 101.1 kg (222 lb 12.8 oz)    SpO2 98%    BMI 31.07 kg/m  General: NAD Neck: No JVD, no thyromegaly or thyroid nodule.  Lungs: Clear to auscultation bilaterally with normal respiratory effort. CV: Nondisplaced PMI.  Heart regular S1/S2, no S3/S4, no murmur.  No peripheral edema.  No carotid bruit.  Normal pedal pulses.  Abdomen: Soft, nontender, no hepatosplenomegaly, no distention.  Skin: Intact without lesions or rashes.  Neurologic: Alert and oriented x 3.  Psych: Normal affect. Extremities: No clubbing or cyanosis.  HEENT: Normal.   Recent Labs: 07/23/2018: Hemoglobin 13.8; Platelets 227 02/11/2019: BUN 29; Creatinine, Ser 1.30; Potassium 4.6; Sodium 140  Personally reviewed   Wt Readings from Last 3 Encounters:  02/11/19 101.1 kg (222 lb 12.8 oz)  08/19/18 101.3 kg (223 lb 6.4 oz)  07/30/18 101.2 kg (223 lb)      ASSESSMENT AND PLAN:  1. Chronic systolic CHF:  EF 76-16% on TEE from 12/16.  cMRI 2/17 with EF 19%, unable to assess for delayed enhancement on this study.  Ischemic cardiomyopathy.  Low cardiac output by RHC in 1/17 but filling pressures optimized.  Despite low output on that RHC, he has been doing quite well with medical treatment.  CPX (3/17) with only mild functional limitation.  Echo was done today and reviewed, EF 45-50% on my review with mild LV dilation, normal RV size and systolic function, IVC normal, trivial MR. EF was lower than in 5/19 but comparable to echoes prior to that.  He is not volume overloaded on exam, weight stable, NYHA class Wells.  - He has not needed Lasix.   - Continue current Toprol XL and spironolactone.  - Increase Entresto to 49/51 bid with BMET today and in 10 days.    2. CAD: s/p CABG.  On 1/17 cath, only LIMA-LAD and SVG-OM2 still patent. No chest pain.  - No ASA given use of apixaban.   - Continue Crestor, good lipids in 5/20.   3. Atrial fibrillation/flutter: Paroxysmal.  Successful DCCV in 3/17 on amiodarone.  Amiodarone stopped, then he had atrial flutter in 12/19 that was cardioverted back to NSR and amiodarone was restarted transiently.  He seems to be maintaining NSR.   - Continue apixaban.  - Would like him to have fibrillation/flutter ablation if he has another recurrence over the short term.  4. Mitral regurgitation: Severe by 12/16 TEE, probably functional.  Much improved on 5/17 echo, only trivial.  Trivial on 5/18 echo. No significant MR on echo 12/2017. Trivial on 7/20 echo.  5. CKD: Stage III. I will arrange BMET.   Signed, Loralie Champagne, MD  02/13/2019  [facility] [address] [facility] [place] [address] [phone] (office) [phone] (fax)

## 2019-02-15 ENCOUNTER — Other Ambulatory Visit (HOSPITAL_COMMUNITY): Payer: Medicare Other

## 2019-02-15 ENCOUNTER — Encounter (HOSPITAL_COMMUNITY): Payer: Medicare Other | Admitting: Cardiology

## 2019-02-21 ENCOUNTER — Other Ambulatory Visit (HOSPITAL_COMMUNITY): Payer: Medicare Other

## 2019-03-03 ENCOUNTER — Other Ambulatory Visit: Payer: Self-pay

## 2019-03-03 ENCOUNTER — Ambulatory Visit (HOSPITAL_COMMUNITY)
Admission: RE | Admit: 2019-03-03 | Discharge: 2019-03-03 | Disposition: A | Discharge status: Home / Self Care | Payer: Medicare Other | Source: Ambulatory Visit | Attending: Cardiology | Admitting: Cardiology

## 2019-03-03 DIAGNOSIS — I5042 Chronic combined systolic (congestive) and diastolic (congestive) heart failure: Secondary | ICD-10-CM | POA: Insufficient documentation

## 2019-03-03 LAB — BASIC METABOLIC PANEL
Anion gap: 11 (ref 5–15)
BUN: 29 mg/dL — ABNORMAL HIGH (ref 8–23)
CO2: 20 mmol/L — ABNORMAL LOW (ref 22–32)
Calcium: 9.4 mg/dL (ref 8.9–10.3)
Chloride: 107 mmol/L (ref 98–111)
Creatinine, Ser: 1.37 mg/dL — ABNORMAL HIGH (ref 0.61–1.24)
GFR calc Af Amer: 59 mL/min — ABNORMAL LOW (ref >60)
GFR calc non Af Amer: 51 mL/min — ABNORMAL LOW (ref >60)
Glucose, Bld: 125 mg/dL — ABNORMAL HIGH (ref 70–99)
Potassium: 4.8 mmol/L (ref 3.5–5.1)
Sodium: 138 mmol/L (ref 135–145)

## 2019-06-13 ENCOUNTER — Other Ambulatory Visit (HOSPITAL_COMMUNITY): Payer: Self-pay

## 2019-06-13 MED ORDER — APIXABAN 5 MG PO TABS: ORAL_TABLET | ORAL | 5 refills | Status: DC

## 2019-06-14 ENCOUNTER — Encounter (HOSPITAL_COMMUNITY): Payer: Self-pay

## 2019-06-14 ENCOUNTER — Other Ambulatory Visit: Payer: Self-pay

## 2019-06-14 ENCOUNTER — Ambulatory Visit (HOSPITAL_COMMUNITY)
Admission: RE | Admit: 2019-06-14 | Discharge: 2019-06-14 | Disposition: A | Discharge status: Home / Self Care | Payer: Medicare Other | Source: Ambulatory Visit | Attending: Cardiology | Admitting: Cardiology

## 2019-06-14 VITALS — BP 118/81 | HR 59 | Wt 221.4 lb

## 2019-06-14 DIAGNOSIS — G4733 Obstructive sleep apnea (adult) (pediatric): Secondary | ICD-10-CM | POA: Insufficient documentation

## 2019-06-14 DIAGNOSIS — Z951 Presence of aortocoronary bypass graft: Secondary | ICD-10-CM | POA: Diagnosis not present

## 2019-06-14 DIAGNOSIS — Z79899 Other long term (current) drug therapy: Secondary | ICD-10-CM | POA: Diagnosis not present

## 2019-06-14 DIAGNOSIS — I480 Paroxysmal atrial fibrillation: Secondary | ICD-10-CM | POA: Insufficient documentation

## 2019-06-14 DIAGNOSIS — I130 Hypertensive heart and chronic kidney disease with heart failure and stage 1 through stage 4 chronic kidney disease, or unspecified chronic kidney disease: Secondary | ICD-10-CM | POA: Diagnosis not present

## 2019-06-14 DIAGNOSIS — I255 Ischemic cardiomyopathy: Secondary | ICD-10-CM | POA: Diagnosis not present

## 2019-06-14 DIAGNOSIS — I340 Nonrheumatic mitral (valve) insufficiency: Secondary | ICD-10-CM | POA: Diagnosis not present

## 2019-06-14 DIAGNOSIS — I491 Atrial premature depolarization: Secondary | ICD-10-CM | POA: Insufficient documentation

## 2019-06-14 DIAGNOSIS — I4892 Unspecified atrial flutter: Secondary | ICD-10-CM | POA: Insufficient documentation

## 2019-06-14 DIAGNOSIS — Z7901 Long term (current) use of anticoagulants: Secondary | ICD-10-CM | POA: Insufficient documentation

## 2019-06-14 DIAGNOSIS — I5042 Chronic combined systolic (congestive) and diastolic (congestive) heart failure: Secondary | ICD-10-CM | POA: Diagnosis not present

## 2019-06-14 DIAGNOSIS — N183 Chronic kidney disease, stage 3 unspecified: Secondary | ICD-10-CM | POA: Diagnosis not present

## 2019-06-14 DIAGNOSIS — I25810 Atherosclerosis of coronary artery bypass graft(s) without angina pectoris: Secondary | ICD-10-CM | POA: Diagnosis not present

## 2019-06-14 DIAGNOSIS — I5022 Chronic systolic (congestive) heart failure: Secondary | ICD-10-CM | POA: Diagnosis not present

## 2019-06-14 DIAGNOSIS — Z87891 Personal history of nicotine dependence: Secondary | ICD-10-CM | POA: Diagnosis not present

## 2019-06-14 DIAGNOSIS — I252 Old myocardial infarction: Secondary | ICD-10-CM | POA: Insufficient documentation

## 2019-06-14 DIAGNOSIS — Z809 Family history of malignant neoplasm, unspecified: Secondary | ICD-10-CM | POA: Diagnosis not present

## 2019-06-14 LAB — CBC
HCT: 42.8 % (ref 39.0–52.0)
Hemoglobin: 13.4 g/dL (ref 13.0–17.0)
MCH: 30.7 pg (ref 26.0–34.0)
MCHC: 31.3 g/dL (ref 30.0–36.0)
MCV: 98.2 fL (ref 80.0–100.0)
Platelets: 233 10*3/uL (ref 150–400)
RBC: 4.36 MIL/uL (ref 4.22–5.81)
RDW: 13.8 % (ref 11.5–15.5)
WBC: 9.3 10*3/uL (ref 4.0–10.5)
nRBC: 0 % (ref 0.0–0.2)

## 2019-06-14 LAB — BASIC METABOLIC PANEL
Anion gap: 9 (ref 5–15)
BUN: 38 mg/dL — ABNORMAL HIGH (ref 8–23)
CO2: 20 mmol/L — ABNORMAL LOW (ref 22–32)
Calcium: 9.2 mg/dL (ref 8.9–10.3)
Chloride: 106 mmol/L (ref 98–111)
Creatinine, Ser: 1.4 mg/dL — ABNORMAL HIGH (ref 0.61–1.24)
GFR calc Af Amer: 58 mL/min — ABNORMAL LOW (ref >60)
GFR calc non Af Amer: 50 mL/min — ABNORMAL LOW (ref >60)
Glucose, Bld: 116 mg/dL — ABNORMAL HIGH (ref 70–99)
Potassium: 5.9 mmol/L — ABNORMAL HIGH (ref 3.5–5.1)
Sodium: 135 mmol/L (ref 135–145)

## 2019-06-14 NOTE — Progress Notes (Signed)
Date:  06/14/2019   ID:  [name], DOB 10-23-1945, MRN [id number]   Provider location: [facility] Type of Visit: Established patient   PCP:  [name], MD  Cardiologist: Dr. Shirlee Latch  Reason for Visit: routine f/u for chronic systolic HF, CAD and H/o Atrial Flutter    History of Present Illness: Stanley Wells is a 73 y.o. male who has a history of CAD, ischemic cardiomyopathy/chronic systolic CHF, severe functional mitral regurgitation, and persistent atrial fibrillation.  Patient had MI then CABG in 2003.  Cardiac cath in 2015 showed only LIMA-LAD and SVG-OM patent.  In 2015, EF was 30-35%.  He had atrial flutter in 2015 and again in 11/16, both times was cardioverted.  After the 11/16 cardioversion, he stopped all his meds.  He was admitted again in 12/16 with atrial fibrillation/RVR and a CHF exacerbation.  He was diuresed and started on Eliquis with plan for DCCV after 4 weeks of anticoagulation.  He was brought back at the end of December for cardioversion, but this was unsuccessful.  TEE in 12/16 showed EF 20-25% with severe functional MR.    He was seen by Dr [name] with plan for tentative plan for mitral valve repair versus replacement and possible tricuspid valve repair along with MAZE procedure.  He was referred to CHF clinic for optimization prior to surgery.   He went for left and right heart cath. RHC showed good left and right heart filling pressures but cardiac output was low.  Coronary angiography was stable with patent LIMA-LAD and SVG-OM.  Other SVGs occluded.    He had DCCV in 3/17 and is in NSR today on amiodarone.  He had a CPX in 3/17 with only mild functional impairment from CHF.    He had appendicitis with appendectomy in 12/17.   Echo in 5/18 showed EF 40-45% (stable) with trivial MR.  Echo in 5/19 showed EF up to 55-60% with no significant MR.   Evaluated in [facility] in 07/23/2018. He was in atrial flutter in the  setting of a URI and underwent DC-CV.  He was put back on amiodarone.  Amiodarone later stopped.   Repeat Echo 02/2019 showed EF 45-50% with septal bounce on my review with mild LV dilation, normal RV size and systolic function, IVC normal.   He presents to clinic today for routine f/u. He is doing well today. Here with his wife. No complaints. Denies dyspnea, CP, palpitations. Mowed his lawn yesterday w/ push mower w/o exertional dyspnea. Exercise tolerance good. No weight gain. Wt down 3 lb since last OV from 223>>221 lb. No LEE, orthopnea or PND. Reports full med compliance. No side effects. Denies abnormal bleeding and no falls w/ Eliquis.   ECG (personally reviewed): sinus bradycardia w/ PACs 56 bpm   Labs (9/16): LDL 126, HDL 38 Labs (12/16): K 4, creatinine 1.15 Labs (1/17): K 4.4, creatinine 1.09, LDL 42, HDL 39 Labs (10/01/2015): K 4.5 Creatinine 1.27 Dig level 0.4  Labs (4/17): digoxin 0.4, LFTs normal, TSH normal Labs (5/17): K 4.5, creatinine 1.36 Labs (6/17): K 5, creatinine 1.88, BNP 88, LDL 54, HDl 39, digoxin 0.8, LFTs normal, TSH normal Labs (9/17): K 4.8, creatinine 1.3, digoxin 0.6, TSH normal, LFTs normal Labs (12/17): K 3.2, creatinine 1.33, digoxin 0.3 Labs (7/18): K 4.8, digoxin 0.2, creatinine 1.22 Labs (10/18): LDL 41, HDL 39, digoxin 0.3 Labs (2/95): K 4.8, creatinine 1.56, hgb 13.9 Labs (4/19): digoxin 0.5, K 4.6,  creatinine 1.37 Labs (07/23/2018): K 4.6 Creatinine 1.49, hgb 13.8 Labs (5/20): K 4.5, creatinine 1.4, LDL 39, hgb 13.5  PMH: 1. CAD: s/p MI in 2003.  CABG x 5 in 2003 with LIMA-LAD, SVG-D1, SVG-OM2, seq SVG-PDA/PLV.  LHC/RHC 11/15 with native arteries essentially occluded.  SVG-D1 and seq SVG-PDA/PLV occluded, LIMA and SVG-OM patent.  LHC (1/17) with patent LIMA-LAD, occluded SVG-D, patent SVG-OM => SVG touches down on inferior branch of OM1 that backfills to superior branch of OM1, there are serial 70-80% stenoses in the proximal inferior OM1 branch  that may compromise backflow to superior branch OM1, SVG-PDA/PLV totally occluded, EF 25% . 2. Atrial flutter: DCCV 2015.  Recurred 11/16 with DCCV.   3. Chronic systolic CHF: Ischemic cardiomyopathy.  Echo (2015) with moderate-severe MR, EF 30-35%.  TEE (12/16) with severe central MR (functional), EF 20-25% with diffuse hypokinesis, moderately decreased RV systolic function, moderate TR.  RHC (1/17) with mean RA 5, PA 27/12 mean 18, mean PCWP 10, CI 1.7 Fick/1.83 thermo. EF 25% on 1/17 LV-gram.  Cardiac MRI (2/17) with EF 19%, dilated LV, images not adequate to assess delayed enhancement.  - CPX (3/17) with peak VO2 20.4, VE/VCO2 slope 34, RER 1.11 => mild functional impairment.  - Echo (5/17) with EF 40-45%, trivial MR, mild RV dilation with low normal RV systolic function.  - Echo (5/18) with EF 40-45%, basal to mid inferior HK, moderate LVH, trivial MR.  - Echo (5/19) with EF 55-60%, no significant MR.  - Echo (7/20) with EF 45-50% on my review with mild LV dilation, normal RV size and systolic function, IVC normal, trivial MR.   4. Mitral regurgitation: Severe on 12/16 TEE, but was only trivial on echo in 5/17 and 5/18 with aggressive medical treatment. 5/19 echo with no significant MR. 7/20 echo with trivial MR.  5. OSA: Not using CPAP.  6. HTN 7. Nephrolithiasis 8. Atrial fibrillation/flutter: Noted in 12/16 initially.  Paroxysmal.  Had DCCV to NSR in 3/17.  - Atrial flutter 12/19 => DCCV to NSR, briefly on amiodarone.  9. PFTs (1/17) with minimal obstructive airways disease but severely decreased DLCO (37% predicted), possibly suggestive of pulmonary vascular disease.  10. ABIs (5/17) were normal.  11. CKD 12. Appendectomy 12/17.   Current Outpatient Medications  Medication Sig Dispense Refill  . apixaban (ELIQUIS) 5 MG TABS tablet TAKE 1 TABLET(5 MG) BY MOUTH TWICE DAILY 60 tablet 5  . furosemide (LASIX) 20 MG tablet Take 2 tablets (40 mg total) by mouth as needed (for 3 lb weight  gain over night or 5 lb weight gain in a week). 90 tablet 6  . isosorbide mononitrate (IMDUR) 30 MG 24 hr tablet TAKE 1 TABLET(30 MG) BY MOUTH DAILY 90 tablet 3  . metoprolol succinate (TOPROL-XL) 25 MG 24 hr tablet Take 2 tablets (50 mg total) by mouth 2 (two) times daily. 180 tablet 6  . rosuvastatin (CRESTOR) 40 MG tablet Take 1 tablet (40 mg total) by mouth daily. 90 tablet 3  . sacubitril-valsartan (ENTRESTO) 49-51 MG Take 1 tablet by mouth 2 (two) times daily. 60 tablet 6  . spironolactone (ALDACTONE) 25 MG tablet TAKE 1 TABLET(25 MG) BY MOUTH DAILY 30 tablet 2   No current facility-administered medications for this encounter.     Allergies:   Patient has no known allergies.   Social History:  The patient  reports that he quit smoking about 17 years ago. His smoking use included cigarettes. He has a 40.00 pack-year smoking history.  He has never used smokeless tobacco. He reports current alcohol use. He reports that he does not use drugs.   Family History:  The patient's family history includes Cancer in his father and mother.   ROS:  Please see the history of present illness.   All other systems are personally reviewed and negative.   Exam:   BP 118/81   Pulse (!) 59   Wt 100.4 kg (221 lb 6.4 oz)   SpO2 97%   BMI 30.88 kg/m  PHYSICAL EXAM: General:  Well appearing WM. No respiratory difficulty HEENT: normal Neck: supple. no JVD. Carotids 2+ bilat; no bruits. No lymphadenopathy or thyromegaly appreciated. Cor: PMI nondisplaced. Regular rate & rhythm. No rubs, gallops or murmurs. Lungs: clear Abdomen: obese, soft, nontender, nondistended. No hepatosplenomegaly. No bruits or masses. Good bowel sounds. Extremities: no cyanosis, clubbing, rash, edema Neuro: alert & oriented x 3, cranial nerves grossly intact. moves all 4 extremities w/o difficulty. Affect pleasant.   Recent Labs: 07/23/2018: Hemoglobin 13.8; Platelets 227 03/03/2019: BUN 29; Creatinine, Ser 1.37; Potassium 4.8;  Sodium 138  Personally reviewed   Wt Readings from Last 3 Encounters:  06/14/19 100.4 kg (221 lb 6.4 oz)  02/11/19 101.1 kg (222 lb 12.8 oz)  08/19/18 101.3 kg (223 lb 6.4 oz)      ASSESSMENT AND PLAN:  1. Chronic systolic CHF: EF 03-00% on TEE from 12/16.  cMRI 2/17 with EF 19%, unable to assess for delayed enhancement on this study.  Ischemic cardiomyopathy.  Low cardiac output by RHC in 1/17 but filling pressures optimized.  Despite low output on that RHC, he has been doing quite well with medical treatment.  CPX (3/17) with only mild functional limitation.  Repeat echo 02/2019 w/ EF 45-50% with mild LV dilation, normal RV size and systolic function, IVC normal, trivial MR. EF was lower than in 5/19 but comparable to echoes prior to that.   - Stable NYHA class II symptoms. He is not volume overloaded on exam, weight stable, down 3 lb from last OV at 221 lb.  - Continue Entresto 49/51 bid. BP too soft for further titration. - Continue current Toprol XL and spironolactone.  - Check BMP today  - We discussed daily wts and low salt diet, he will take PRN lasix if 3 lb wt gain in 24 hrs or > 5 lb in 1 week 2. CAD: s/p CABG.  On 1/17 cath, only LIMA-LAD and SVG-OM2 still patent. No chest pain.  - No ASA given use of apixaban.   - Continue Crestor, Lipids at goal 12/2018   3. Atrial fibrillation/flutter: Paroxysmal.  Successful DCCV in 3/17 on amiodarone.  Amiodarone stopped, then he had atrial flutter in 12/19 that was cardioverted back to NSR and amiodarone was restarted transiently.  He is maintaing NSR w/ PACs on EKG. HR upper 50s. - Continue apixaban. Check CBC and BMP today - Would like him to have fibrillation/flutter ablation if he has another recurrence over the short term.  4. Mitral regurgitation: Severe by 12/16 TEE, probably functional.  Much improved on 5/17 echo, only trivial.  Trivial on 5/18 echo. No significant MR on echo 12/2017. Trivial on 7/20 echo.  -stable. Denies dyspnea. No  CP. 5. CKD: Stage III. check BMP today   F/u w/ Dr. Aundra Dubin in 3 months   Signed, Lyda Jester, PA-C  06/14/2019  [place] [address] [phone] (office) [phone] (fax)

## 2019-06-14 NOTE — Patient Instructions (Signed)
It was great to see you today! No medication changes are needed at this time.  Labs today We will only contact you if something comes back abnormal or we need to make some changes. Otherwise no news is good news!  Your physician recommends that you schedule a follow-up appointment in: 3 months with Dr. McLean  Do the following things EVERYDAY: 1) Weigh yourself in the morning before breakfast. Write it down and keep it in a log. 2) Take your medicines as prescribed 3) Eat low salt foods-Limit salt (sodium) to 2000 mg per day.  4) Stay as active as you can everyday 5) Limit all fluids for the day to less than 2 liters  At the [facility], you and your health needs are our priority. As part of our continuing mission to provide you with exceptional heart care, we have created designated Provider Care Teams. These Care Teams include your primary Cardiologist (physician) and Advanced Practice Providers (APPs- Physician Assistants and Nurse Practitioners) who all work together to provide you with the care you need, when you need it.   You may see any of the following providers on your designated Care Team at your next follow up: . Dr Daniel Bensimhon . Dr Dalton McLean . Amy Clegg, NP . Brittainy Simmons, PA   Please be sure to bring in all your medications bottles to every appointment.     

## 2019-06-16 ENCOUNTER — Telehealth (HOSPITAL_COMMUNITY): Payer: Self-pay | Admitting: Cardiology

## 2019-06-16 ENCOUNTER — Other Ambulatory Visit: Payer: Self-pay

## 2019-06-16 ENCOUNTER — Ambulatory Visit (HOSPITAL_COMMUNITY)
Admission: RE | Admit: 2019-06-16 | Discharge: 2019-06-16 | Disposition: A | Discharge status: Home / Self Care | Payer: Medicare Other | Source: Ambulatory Visit | Attending: Internal Medicine | Admitting: Internal Medicine

## 2019-06-16 DIAGNOSIS — I5042 Chronic combined systolic (congestive) and diastolic (congestive) heart failure: Secondary | ICD-10-CM | POA: Insufficient documentation

## 2019-06-16 LAB — BASIC METABOLIC PANEL
Anion gap: 9 (ref 5–15)
BUN: 34 mg/dL — ABNORMAL HIGH (ref 8–23)
CO2: 24 mmol/L (ref 22–32)
Calcium: 9.2 mg/dL (ref 8.9–10.3)
Chloride: 105 mmol/L (ref 98–111)
Creatinine, Ser: 1.54 mg/dL — ABNORMAL HIGH (ref 0.61–1.24)
GFR calc Af Amer: 51 mL/min — ABNORMAL LOW (ref >60)
GFR calc non Af Amer: 44 mL/min — ABNORMAL LOW (ref >60)
Glucose, Bld: 119 mg/dL — ABNORMAL HIGH (ref 70–99)
Potassium: 4.5 mmol/L (ref 3.5–5.1)
Sodium: 138 mmol/L (ref 135–145)

## 2019-06-16 NOTE — Telephone Encounter (Signed)
Notes recorded by Kerry Dory, CMA on 06/15/2019 at 9:32 AM EST  Pt aware and voiced understanding  ------   Notes recorded by Consuelo Pandy, PA-C on 06/14/2019 at 8:01 PM EST  K elevated at 5.9. have pt take a dose of Lasix 20 mg and stop spironolactone. Repeat BMP on Thursday

## 2019-06-16 NOTE — Telephone Encounter (Signed)
-----   Message from [place], [place] sent at 06/14/2019  8:01 PM EST ----- K elevated at 5.9. have pt take a dose of Lasix 20 mg and stop spironolactone. Repeat BMP on Thursday

## 2019-07-04 ENCOUNTER — Other Ambulatory Visit (HOSPITAL_COMMUNITY): Payer: Self-pay

## 2019-07-04 MED ORDER — METOPROLOL SUCCINATE ER 25 MG PO TB24: 50.0000 mg | ORAL_TABLET | Freq: Two times a day (BID) | ORAL | 6 refills | Status: DC

## 2019-08-15 ENCOUNTER — Telehealth (HOSPITAL_COMMUNITY): Payer: Self-pay | Admitting: Licensed Clinical Social Worker

## 2019-08-15 NOTE — Telephone Encounter (Addendum)
Pt had been enrolled with [employer] copay grant to assist with Sherryll Burger but that expired in December.  CSW called to discuss enrolling in [employer] foundation- pt agreeable- CSW assisted with applying and pt approved  Member ID: [id number] Group ID: [id number] RxBin ID: [id number] PCN: [id number] Eligibility Start Date: 05/17/2019 Eligibility End Date: 05/15/2020 Assistance Amount: $1,000.00  Information provided to pt pharmacy  Burna Sis, LCSW Clinical Social Worker [facility] Desk#: [phone] Cell#: [phone]

## 2019-08-31 ENCOUNTER — Other Ambulatory Visit (HOSPITAL_COMMUNITY): Payer: Self-pay

## 2019-08-31 MED ORDER — METOPROLOL SUCCINATE ER 25 MG PO TB24: 50.0000 mg | ORAL_TABLET | Freq: Two times a day (BID) | ORAL | 6 refills | Status: DC

## 2019-09-09 ENCOUNTER — Other Ambulatory Visit (HOSPITAL_COMMUNITY): Payer: Self-pay | Admitting: *Deleted

## 2019-09-09 MED ORDER — METOPROLOL SUCCINATE ER 50 MG PO TB24: 50.0000 mg | ORAL_TABLET | Freq: Two times a day (BID) | ORAL | 6 refills | Status: DC

## 2019-09-09 MED ORDER — SACUBITRIL-VALSARTAN 49-51 MG PO TABS: 1.0000 | ORAL_TABLET | Freq: Two times a day (BID) | ORAL | 6 refills | Status: DC

## 2019-09-14 ENCOUNTER — Telehealth (HOSPITAL_COMMUNITY): Payer: Self-pay

## 2019-09-14 NOTE — Telephone Encounter (Signed)

## 2019-09-15 ENCOUNTER — Encounter (HOSPITAL_COMMUNITY): Payer: Self-pay | Admitting: Cardiology

## 2019-09-15 ENCOUNTER — Ambulatory Visit (HOSPITAL_COMMUNITY)
Admission: RE | Admit: 2019-09-15 | Discharge: 2019-09-15 | Disposition: A | Discharge status: Home / Self Care | Payer: Medicare Other | Source: Ambulatory Visit | Attending: Cardiology | Admitting: Cardiology

## 2019-09-15 ENCOUNTER — Other Ambulatory Visit: Payer: Self-pay

## 2019-09-15 VITALS — BP 140/90 | HR 73 | Wt 233.0 lb

## 2019-09-15 DIAGNOSIS — I255 Ischemic cardiomyopathy: Secondary | ICD-10-CM | POA: Insufficient documentation

## 2019-09-15 DIAGNOSIS — Z7901 Long term (current) use of anticoagulants: Secondary | ICD-10-CM | POA: Insufficient documentation

## 2019-09-15 DIAGNOSIS — I252 Old myocardial infarction: Secondary | ICD-10-CM | POA: Diagnosis not present

## 2019-09-15 DIAGNOSIS — I2510 Atherosclerotic heart disease of native coronary artery without angina pectoris: Secondary | ICD-10-CM | POA: Insufficient documentation

## 2019-09-15 DIAGNOSIS — I4892 Unspecified atrial flutter: Secondary | ICD-10-CM | POA: Insufficient documentation

## 2019-09-15 DIAGNOSIS — Z951 Presence of aortocoronary bypass graft: Secondary | ICD-10-CM | POA: Insufficient documentation

## 2019-09-15 DIAGNOSIS — I5042 Chronic combined systolic (congestive) and diastolic (congestive) heart failure: Secondary | ICD-10-CM

## 2019-09-15 DIAGNOSIS — G4733 Obstructive sleep apnea (adult) (pediatric): Secondary | ICD-10-CM | POA: Diagnosis not present

## 2019-09-15 DIAGNOSIS — Z79899 Other long term (current) drug therapy: Secondary | ICD-10-CM | POA: Insufficient documentation

## 2019-09-15 DIAGNOSIS — F17210 Nicotine dependence, cigarettes, uncomplicated: Secondary | ICD-10-CM | POA: Insufficient documentation

## 2019-09-15 DIAGNOSIS — N183 Chronic kidney disease, stage 3 unspecified: Secondary | ICD-10-CM | POA: Diagnosis not present

## 2019-09-15 DIAGNOSIS — I5022 Chronic systolic (congestive) heart failure: Secondary | ICD-10-CM | POA: Insufficient documentation

## 2019-09-15 DIAGNOSIS — I130 Hypertensive heart and chronic kidney disease with heart failure and stage 1 through stage 4 chronic kidney disease, or unspecified chronic kidney disease: Secondary | ICD-10-CM | POA: Insufficient documentation

## 2019-09-15 DIAGNOSIS — I480 Paroxysmal atrial fibrillation: Secondary | ICD-10-CM | POA: Insufficient documentation

## 2019-09-15 DIAGNOSIS — I340 Nonrheumatic mitral (valve) insufficiency: Secondary | ICD-10-CM | POA: Diagnosis not present

## 2019-09-15 LAB — CBC
HCT: 45.7 % (ref 39.0–52.0)
Hemoglobin: 14.3 g/dL (ref 13.0–17.0)
MCH: 29.6 pg (ref 26.0–34.0)
MCHC: 31.3 g/dL (ref 30.0–36.0)
MCV: 94.6 fL (ref 80.0–100.0)
Platelets: 221 10*3/uL (ref 150–400)
RBC: 4.83 MIL/uL (ref 4.22–5.81)
RDW: 13.9 % (ref 11.5–15.5)
WBC: 9.8 10*3/uL (ref 4.0–10.5)
nRBC: 0 % (ref 0.0–0.2)

## 2019-09-15 LAB — LIPID PANEL
Cholesterol: 114 mg/dL (ref 0–200)
HDL: 40 mg/dL — ABNORMAL LOW (ref >40)
LDL Cholesterol: 38 mg/dL (ref 0–99)
Total CHOL/HDL Ratio: 2.9 RATIO
Triglycerides: 178 mg/dL — ABNORMAL HIGH (ref <150)
VLDL: 36 mg/dL (ref 0–40)

## 2019-09-15 LAB — BASIC METABOLIC PANEL
Anion gap: 10 (ref 5–15)
BUN: 22 mg/dL (ref 8–23)
CO2: 21 mmol/L — ABNORMAL LOW (ref 22–32)
Calcium: 9.3 mg/dL (ref 8.9–10.3)
Chloride: 109 mmol/L (ref 98–111)
Creatinine, Ser: 1.15 mg/dL (ref 0.61–1.24)
GFR calc Af Amer: >60 mL/min (ref >60)
GFR calc non Af Amer: >60 mL/min (ref >60)
Glucose, Bld: 132 mg/dL — ABNORMAL HIGH (ref 70–99)
Potassium: 4.2 mmol/L (ref 3.5–5.1)
Sodium: 140 mmol/L (ref 135–145)

## 2019-09-15 MED ORDER — SACUBITRIL-VALSARTAN 97-103 MG PO TABS: 1.0000 | ORAL_TABLET | Freq: Two times a day (BID) | ORAL | 5 refills | Status: DC

## 2019-09-15 NOTE — Patient Instructions (Signed)
INCREASE Entresto to 97/103mg  (1 tab) twice a day   Labs today We will only contact you if something comes back abnormal or we need to make some changes. Otherwise no news is good news!   Repeat labs in 2 weeks   Your physician has requested that you have an echocardiogram. Echocardiography is a painless test that uses sound waves to create images of your heart. It provides your doctor with information about the size and shape of your heart and how well your heart's chambers and valves are working. This procedure takes approximately one hour. There are no restrictions for this procedure.   Your physician recommends that you schedule a follow-up appointment in: 5 months with Dr Shirlee Latch and an ECHO.  We will call you to schedule this appointment.   Please call office at [phone] option 2 if you have any questions or concerns.    At the [facility], you and your health needs are our priority. As part of our continuing mission to provide you with exceptional heart care, we have created designated Provider Care Teams. These Care Teams include your primary Cardiologist (physician) and Advanced Practice Providers (APPs- Physician Assistants and Nurse Practitioners) who all work together to provide you with the care you need, when you need it.   You may see any of the following providers on your designated Care Team at your next follow up: Marland Kitchen Dr Arvilla Meres . Dr Marca Ancona . Tonye Becket, NP . Robbie Lis, PA . Karle Plumber, PharmD   Please be sure to bring in all your medications bottles to every appointment.

## 2019-09-15 NOTE — Progress Notes (Signed)
Date:  09/15/2019   ID:  Stanley Wells, DOB 1946/01/15, MRN [id number]   Provider location: [facility] Type of Visit: Established patient   PCP:  [name], MD  Cardiologist: Dr. Shirlee Latch  Chief Complaint: Atrial flutter   History of Present Illness: Stanley Wells is a 74 y.o. male who has a history of CAD, ischemic cardiomyopathy/chronic systolic CHF, severe functional mitral regurgitation, and persistent atrial fibrillation.  Patient had MI then CABG in 2003.  Cardiac cath in 2015 showed only LIMA-LAD and SVG-OM patent.  In 2015, EF was 30-35%.  He had atrial flutter in 2015 and again in 11/16, both times was cardioverted.  After the 11/16 cardioversion, he stopped all his meds.  He was admitted again in 12/16 with atrial fibrillation/RVR and a CHF exacerbation.  He was diuresed and started on Eliquis with plan for DCCV after 4 weeks of anticoagulation.  He was brought back at the end of December for cardioversion, but this was unsuccessful.  TEE in 12/16 showed EF 20-25% with severe functional MR.    He was seen by Dr [name] with plan for tentative plan for mitral valve repair versus replacement and possible tricuspid valve repair along with MAZE procedure.  He was referred to CHF clinic for optimization prior to surgery.   He went for left and right heart cath. RHC showed good left and right heart filling pressures but cardiac output was low.  Coronary angiography was stable with patent LIMA-LAD and SVG-OM.  Other SVGs occluded.    He had DCCV in 3/17 and is in NSR today on amiodarone.  He had a CPX in 3/17 with only mild functional impairment from CHF.    He had appendicitis with appendectomy in 12/17.   Evaluated [facility] in 07/23/2018. He was in atrial flutter in the setting of a URI and underwent DC-CV.  He was put back on amiodarone. Amiodarone later stopped.   Echo in 7/20 showed EF 45-50% with septal bounce on my review with mild LV  dilation, normal RV size and systolic function, IVC normal.   Patient presents for followup of CHF and CAD.  He is doing well overall.  No significant exertional dyspnea.  No chest pain.  No palpitations.  No BRBPR/melena.  He has not had to use Lasix.  He is off spironolactone for elevated K after last appointment, but it looks like this was actually due to hemolysis.  BP is elevated today.   ECG (personally reviewed): NSR with PACs  Labs (9/16): LDL 126, HDL 38 Labs (12/16): K 4, creatinine 1.15 Labs (1/17): K 4.4, creatinine 1.09, LDL 42, HDL 39 Labs (10/01/2015): K 4.5 Creatinine 1.27 Dig level 0.4  Labs (4/17): digoxin 0.4, LFTs normal, TSH normal Labs (5/17): K 4.5, creatinine 1.36 Labs (6/17): K 5, creatinine 1.88, BNP 88, LDL 54, HDl 39, digoxin 0.8, LFTs normal, TSH normal Labs (9/17): K 4.8, creatinine 1.3, digoxin 0.6, TSH normal, LFTs normal Labs (12/17): K 3.2, creatinine 1.33, digoxin 0.3 Labs (7/18): K 4.8, digoxin 0.2, creatinine 1.22 Labs (10/18): LDL 41, HDL 39, digoxin 0.3 Labs (4/74): K 4.8, creatinine 1.56, hgb 13.9 Labs (4/19): digoxin 0.5, K 4.6, creatinine 1.37 Labs (07/23/2018): K 4.6 Creatinine 1.49, hgb 13.8 Labs (5/20): K 4.5, creatinine 1.4, LDL 39, hgb 13.5 Labs (11/20): K 4.5, creatinine 1.05, hgb 13.4  PMH: 1. CAD: s/p MI in 2003.  CABG x 5 in 2003 with LIMA-LAD, SVG-D1, SVG-OM2, seq  SVG-PDA/PLV.  LHC/RHC 11/15 with native arteries essentially occluded.  SVG-D1 and seq SVG-PDA/PLV occluded, LIMA and SVG-OM patent.  LHC (1/17) with patent LIMA-LAD, occluded SVG-D, patent SVG-OM => SVG touches down on inferior branch of OM1 that backfills to superior branch of OM1, there are serial 70-80% stenoses in the proximal inferior OM1 branch that may compromise backflow to superior branch OM1, SVG-PDA/PLV totally occluded, EF 25% . 2. Atrial flutter: DCCV 2015.  Recurred 11/16 with DCCV.   3. Chronic systolic CHF: Ischemic cardiomyopathy.  Echo (2015) with  moderate-severe MR, EF 30-35%.  TEE (12/16) with severe central MR (functional), EF 20-25% with diffuse hypokinesis, moderately decreased RV systolic function, moderate TR.  RHC (1/17) with mean RA 5, PA 27/12 mean 18, mean PCWP 10, CI 1.7 Fick/1.83 thermo. EF 25% on 1/17 LV-gram.  Cardiac MRI (2/17) with EF 19%, dilated LV, images not adequate to assess delayed enhancement.  - CPX (3/17) with peak VO2 20.4, VE/VCO2 slope 34, RER 1.11 => mild functional impairment.  - Echo (5/17) with EF 40-45%, trivial MR, mild RV dilation with low normal RV systolic function.  - Echo (5/18) with EF 40-45%, basal to mid inferior HK, moderate LVH, trivial MR.  - Echo (5/19) with EF 55-60%, no significant MR.  - Echo (7/20) with EF 45-50% on my review with mild LV dilation, normal RV size and systolic function, IVC normal, trivial MR.   4. Mitral regurgitation: Severe on 12/16 TEE, but was only trivial on echo in 5/17 and 5/18 with aggressive medical treatment. 5/19 echo with no significant MR. 7/20 echo with trivial MR.  5. OSA: Not using CPAP.  6. HTN 7. Nephrolithiasis 8. Atrial fibrillation/flutter: Noted in 12/16 initially.  Paroxysmal.  Had DCCV to NSR in 3/17.  - Atrial flutter 12/19 => DCCV to NSR, briefly on amiodarone.  9. PFTs (1/17) with minimal obstructive airways disease but severely decreased DLCO (37% predicted), possibly suggestive of pulmonary vascular disease.  10. ABIs (5/17) were normal.  11. CKD 12. Appendectomy 12/17.   Current Outpatient Medications  Medication Sig Dispense Refill  . apixaban (ELIQUIS) 5 MG TABS tablet TAKE 1 TABLET(5 MG) BY MOUTH TWICE DAILY 60 tablet 5  . furosemide (LASIX) 20 MG tablet Take 2 tablets (40 mg total) by mouth as needed (for 3 lb weight gain over night or 5 lb weight gain in a week). 90 tablet 6  . isosorbide mononitrate (IMDUR) 30 MG 24 hr tablet TAKE 1 TABLET(30 MG) BY MOUTH DAILY 90 tablet 3  . metoprolol succinate (TOPROL-XL) 50 MG 24 hr tablet Take  1 tablet (50 mg total) by mouth 2 (two) times daily. 60 tablet 6  . rosuvastatin (CRESTOR) 40 MG tablet Take 1 tablet (40 mg total) by mouth daily. 90 tablet 3  . sacubitril-valsartan (ENTRESTO) 97-103 MG Take 1 tablet by mouth 2 (two) times daily. 60 tablet 5   No current facility-administered medications for this encounter.    Allergies:   Patient has no known allergies.   Social History:  The patient  reports that he quit smoking about 17 years ago. His smoking use included cigarettes. He has a 40.00 pack-year smoking history. He has never used smokeless tobacco. He reports current alcohol use. He reports that he does not use drugs.   Family History:  The patient's family history includes Cancer in his father and mother.   ROS:  Please see the history of present illness.   All other systems are personally reviewed and negative.  Exam:   BP 140/90   Pulse 73   Wt 105.7 kg (233 lb)   SpO2 95%   BMI 32.50 kg/m  General: NAD Neck: No JVD, no thyromegaly or thyroid nodule.  Lungs: Clear to auscultation bilaterally with normal respiratory effort. CV: Nondisplaced PMI.  Heart regular S1/S2, no S3/S4, no murmur.  No peripheral edema.  No carotid bruit.  Normal pedal pulses.  Abdomen: Soft, nontender, no hepatosplenomegaly, no distention.  Skin: Intact without lesions or rashes.  Neurologic: Alert and oriented x 3.  Psych: Normal affect. Extremities: No clubbing or cyanosis.  HEENT: Normal.   Recent Labs: 09/15/2019: BUN 22; Creatinine, Ser 1.15; Hemoglobin 14.3; Platelets 221; Potassium 4.2; Sodium 140  Personally reviewed   Wt Readings from Last 3 Encounters:  09/15/19 105.7 kg (233 lb)  06/14/19 100.4 kg (221 lb 6.4 oz)  02/11/19 101.1 kg (222 lb 12.8 oz)      ASSESSMENT AND PLAN:  1. Chronic systolic CHF: EF 37-85% on TEE from 12/16.  cMRI 2/17 with EF 19%, unable to assess for delayed enhancement on this study.  Ischemic cardiomyopathy.  Low cardiac output by RHC in 1/17  but filling pressures optimized.  Despite low output on that RHC, he has been doing quite well with medical treatment.  CPX (3/17) with only mild functional limitation.  Echo in 7/20 showed EF 45-50% on my review with mild LV dilation, normal RV size and systolic function, IVC normal, trivial MR. EF was lower than in 5/19 but comparable to echoes prior to that.  He is not volume overloaded on exam, weight stable, NYHA class II.  - He has not needed Lasix.   - Continue current Toprol XL.  - Increase Entresto to 97/103 bid with BMET today and in 10 days.  This should help with elevated BP. - It would be reasonable to restart spironolactone in the future, think he had high K due to hemolysis.  2. CAD: s/p CABG.  On 1/17 cath, only LIMA-LAD and SVG-OM2 still patent. No chest pain.  - No ASA given use of apixaban.  CBC today.  - Continue Crestor, check lipids today.    3. Atrial fibrillation/flutter: Paroxysmal.  Successful DCCV in 3/17 on amiodarone.  Amiodarone stopped, then he had atrial flutter in 12/19 that was cardioverted back to NSR and amiodarone was restarted transiently.  He seems to be maintaining NSR.   - Continue apixaban.  - Would like him to have fibrillation/flutter ablation if he has another recurrence over the short term.  4. Mitral regurgitation: Severe by 12/16 TEE, probably functional.  Much improved on 5/17 echo, only trivial.  Trivial on 5/18 echo. No significant MR on echo 12/2017. Trivial on 7/20 echo.  5. CKD: Stage III. BMET today.    Signed, Loralie Champagne, MD  09/15/2019  [place] [address] [facility] [place] [address] [phone] (office) [phone] (fax)

## 2019-09-29 ENCOUNTER — Other Ambulatory Visit: Payer: Self-pay

## 2019-09-29 ENCOUNTER — Ambulatory Visit (HOSPITAL_COMMUNITY)
Admission: RE | Admit: 2019-09-29 | Discharge: 2019-09-29 | Disposition: A | Discharge status: Home / Self Care | Payer: Medicare Other | Source: Ambulatory Visit | Attending: Internal Medicine | Admitting: Internal Medicine

## 2019-09-29 DIAGNOSIS — I5042 Chronic combined systolic (congestive) and diastolic (congestive) heart failure: Secondary | ICD-10-CM | POA: Insufficient documentation

## 2019-09-29 LAB — BASIC METABOLIC PANEL
Anion gap: 7 (ref 5–15)
BUN: 21 mg/dL (ref 8–23)
CO2: 26 mmol/L (ref 22–32)
Calcium: 9.2 mg/dL (ref 8.9–10.3)
Chloride: 106 mmol/L (ref 98–111)
Creatinine, Ser: 1.26 mg/dL — ABNORMAL HIGH (ref 0.61–1.24)
GFR calc Af Amer: >60 mL/min (ref >60)
GFR calc non Af Amer: 56 mL/min — ABNORMAL LOW (ref >60)
Glucose, Bld: 128 mg/dL — ABNORMAL HIGH (ref 70–99)
Potassium: 4.1 mmol/L (ref 3.5–5.1)
Sodium: 139 mmol/L (ref 135–145)

## 2019-11-08 DIAGNOSIS — Z23 Encounter for immunization: Secondary | ICD-10-CM | POA: Diagnosis not present

## 2019-11-29 ENCOUNTER — Other Ambulatory Visit (HOSPITAL_COMMUNITY): Payer: Self-pay

## 2019-11-29 MED ORDER — APIXABAN 5 MG PO TABS: 5.0000 mg | ORAL_TABLET | Freq: Two times a day (BID) | ORAL | 3 refills | Status: DC

## 2019-11-29 MED ORDER — ISOSORBIDE MONONITRATE ER 30 MG PO TB24: 30.0000 mg | ORAL_TABLET | Freq: Every day | ORAL | 3 refills | Status: DC

## 2019-12-30 ENCOUNTER — Other Ambulatory Visit (HOSPITAL_COMMUNITY): Payer: Self-pay

## 2019-12-30 MED ORDER — ROSUVASTATIN CALCIUM 40 MG PO TABS: 40.0000 mg | ORAL_TABLET | Freq: Every day | ORAL | 3 refills | Status: DC

## 2020-03-12 ENCOUNTER — Ambulatory Visit (HOSPITAL_BASED_OUTPATIENT_CLINIC_OR_DEPARTMENT_OTHER)
Admission: RE | Admit: 2020-03-12 | Discharge: 2020-03-12 | Disposition: A | Discharge status: Home / Self Care | Payer: Medicare Other | Source: Ambulatory Visit | Attending: Cardiology | Admitting: Cardiology

## 2020-03-12 ENCOUNTER — Ambulatory Visit (HOSPITAL_COMMUNITY)
Admission: RE | Admit: 2020-03-12 | Discharge: 2020-03-12 | Disposition: A | Discharge status: Home / Self Care | Payer: Medicare Other | Source: Ambulatory Visit | Attending: Cardiology | Admitting: Cardiology

## 2020-03-12 ENCOUNTER — Other Ambulatory Visit (HOSPITAL_COMMUNITY)
Admission: RE | Admit: 2020-03-12 | Discharge: 2020-03-12 | Disposition: A | Discharge status: Home / Self Care | Payer: Medicare Other | Source: Ambulatory Visit | Attending: Cardiology | Admitting: Cardiology

## 2020-03-12 ENCOUNTER — Other Ambulatory Visit (HOSPITAL_COMMUNITY): Payer: Self-pay | Admitting: *Deleted

## 2020-03-12 ENCOUNTER — Encounter (HOSPITAL_COMMUNITY): Payer: Self-pay | Admitting: Cardiology

## 2020-03-12 ENCOUNTER — Other Ambulatory Visit: Payer: Self-pay

## 2020-03-12 VITALS — BP 106/78 | HR 78 | Ht 71.0 in | Wt 220.4 lb

## 2020-03-12 DIAGNOSIS — I4819 Other persistent atrial fibrillation: Secondary | ICD-10-CM | POA: Insufficient documentation

## 2020-03-12 DIAGNOSIS — I480 Paroxysmal atrial fibrillation: Secondary | ICD-10-CM

## 2020-03-12 DIAGNOSIS — Z7901 Long term (current) use of anticoagulants: Secondary | ICD-10-CM | POA: Diagnosis not present

## 2020-03-12 DIAGNOSIS — N183 Chronic kidney disease, stage 3 unspecified: Secondary | ICD-10-CM | POA: Diagnosis not present

## 2020-03-12 DIAGNOSIS — I5042 Chronic combined systolic (congestive) and diastolic (congestive) heart failure: Secondary | ICD-10-CM

## 2020-03-12 DIAGNOSIS — I255 Ischemic cardiomyopathy: Secondary | ICD-10-CM | POA: Insufficient documentation

## 2020-03-12 DIAGNOSIS — E785 Hyperlipidemia, unspecified: Secondary | ICD-10-CM | POA: Insufficient documentation

## 2020-03-12 DIAGNOSIS — Z79899 Other long term (current) drug therapy: Secondary | ICD-10-CM | POA: Insufficient documentation

## 2020-03-12 DIAGNOSIS — I340 Nonrheumatic mitral (valve) insufficiency: Secondary | ICD-10-CM | POA: Insufficient documentation

## 2020-03-12 DIAGNOSIS — I130 Hypertensive heart and chronic kidney disease with heart failure and stage 1 through stage 4 chronic kidney disease, or unspecified chronic kidney disease: Secondary | ICD-10-CM | POA: Diagnosis not present

## 2020-03-12 DIAGNOSIS — I252 Old myocardial infarction: Secondary | ICD-10-CM | POA: Insufficient documentation

## 2020-03-12 DIAGNOSIS — G4733 Obstructive sleep apnea (adult) (pediatric): Secondary | ICD-10-CM | POA: Diagnosis not present

## 2020-03-12 DIAGNOSIS — I2510 Atherosclerotic heart disease of native coronary artery without angina pectoris: Secondary | ICD-10-CM | POA: Insufficient documentation

## 2020-03-12 DIAGNOSIS — I4892 Unspecified atrial flutter: Secondary | ICD-10-CM | POA: Insufficient documentation

## 2020-03-12 DIAGNOSIS — Z20822 Contact with and (suspected) exposure to covid-19: Secondary | ICD-10-CM | POA: Diagnosis not present

## 2020-03-12 DIAGNOSIS — Z87891 Personal history of nicotine dependence: Secondary | ICD-10-CM | POA: Diagnosis not present

## 2020-03-12 DIAGNOSIS — Z955 Presence of coronary angioplasty implant and graft: Secondary | ICD-10-CM | POA: Insufficient documentation

## 2020-03-12 DIAGNOSIS — I5022 Chronic systolic (congestive) heart failure: Secondary | ICD-10-CM | POA: Diagnosis not present

## 2020-03-12 LAB — ECHOCARDIOGRAM COMPLETE
Area-P 1/2: 3.48 cm2
S' Lateral: 3.5 cm

## 2020-03-12 LAB — BASIC METABOLIC PANEL
Anion gap: 10 (ref 5–15)
BUN: 34 mg/dL — ABNORMAL HIGH (ref 8–23)
CO2: 21 mmol/L — ABNORMAL LOW (ref 22–32)
Calcium: 9.1 mg/dL (ref 8.9–10.3)
Chloride: 108 mmol/L (ref 98–111)
Creatinine, Ser: 1.41 mg/dL — ABNORMAL HIGH (ref 0.61–1.24)
GFR calc Af Amer: 57 mL/min — ABNORMAL LOW (ref >60)
GFR calc non Af Amer: 49 mL/min — ABNORMAL LOW (ref >60)
Glucose, Bld: 118 mg/dL — ABNORMAL HIGH (ref 70–99)
Potassium: 4.4 mmol/L (ref 3.5–5.1)
Sodium: 139 mmol/L (ref 135–145)

## 2020-03-12 LAB — CBC
HCT: 47.4 % (ref 39.0–52.0)
Hemoglobin: 15 g/dL (ref 13.0–17.0)
MCH: 29.7 pg (ref 26.0–34.0)
MCHC: 31.6 g/dL (ref 30.0–36.0)
MCV: 93.9 fL (ref 80.0–100.0)
Platelets: 222 10*3/uL (ref 150–400)
RBC: 5.05 MIL/uL (ref 4.22–5.81)
RDW: 15.3 % (ref 11.5–15.5)
WBC: 9.9 10*3/uL (ref 4.0–10.5)
nRBC: 0 % (ref 0.0–0.2)

## 2020-03-12 LAB — SARS CORONAVIRUS 2 (TAT 6-24 HRS): SARS Coronavirus 2: NEGATIVE

## 2020-03-12 NOTE — Patient Instructions (Addendum)
Labs done today, we will call you for abnormal results  Your physician has recommended that you have a Cardioversion (DCCV). Electrical Cardioversion uses a jolt of electricity to your heart either through paddles or wired patches attached to your chest. This is a controlled, usually prescheduled, procedure. Defibrillation is done under light anesthesia in the hospital, and you usually go home the day of the procedure. This is done to get your heart back into a normal rhythm. You are not awake for the procedure. Please see the instruction sheet given to you today. SEE INSTRUCTIONS BELOW  You have been referred to EP to discuss a possible a-fib ablation  Your physician recommends that you schedule a follow-up appointment in: 1 month  If you have any questions or concerns before your next appointment please send [place] a message through [place] or call our office at [phone].    TO LEAVE A MESSAGE FOR THE NURSE SELECT OPTION 2, PLEASE LEAVE A MESSAGE INCLUDING: . YOUR NAME . DATE OF BIRTH . CALL BACK NUMBER . REASON FOR CALL**this is important as we prioritize the call backs  YOU WILL RECEIVE A CALL BACK THE SAME DAY AS LONG AS YOU CALL BEFORE 4:00 PM  At the [facility], you and your health needs are our priority. As part of our continuing mission to provide you with exceptional heart care, we have created designated Provider Care Teams. These Care Teams include your primary Cardiologist (physician) and Advanced Practice Providers (APPs- Physician Assistants and Nurse Practitioners) who all work together to provide you with the care you need, when you need it.   You may see any of the following providers on your designated Care Team at your next follow up: Marland Kitchen Dr Arvilla Meres . Dr Marca Ancona . Tonye Becket, NP . Robbie Lis, PA . Karle Plumber, PharmD   Please be sure to bring in all your medications bottles to every appointment.    CARDIOVERSION  INSTRUCTIONS  You are scheduled for a Cardioversion on Thursday 03/15/20 with Dr. Shirlee Latch.  Please arrive at the [facility] (Main Entrance A) at [facility]: [address] [place], [place] [address] at 8:30 am.   DIET: Nothing to eat or drink after midnight except a sip of water with medications (see medication instructions below)  Medication Instructions: Hold FUROSEMIDE Thursday 8/12 AM  Continue your anticoagulant: ELIQUIS, DO NOT MISS ANY DOSES   COVID TEST: TODAY 03/12/20, PLEASE GO TO:   [address]  [place], [place] [address]  You must have a responsible person to drive you home and stay in the waiting area during your procedure. Failure to do so could result in cancellation.  Bring your insurance cards.  *Special Note: Every effort is made to have your procedure done on time. Occasionally there are emergencies that occur at the hospital that may cause delays. Please be patient if a delay does occur.

## 2020-03-12 NOTE — Progress Notes (Signed)
Date:  03/12/2020   ID:  Stanley Wells, DOB 08/28/1945, MRN [id number]   Provider location: [facility] Type of Visit: Established patient   PCP:  Noni Saupeedding, [name], MD  Cardiologist: Dr. Shirlee LatchMcLean  Chief Complaint: Atrial flutter   History of Present Illness: Stanley FreibergJames C Wells is a 74 y.o. male who has a history of CAD, ischemic cardiomyopathy/chronic systolic CHF, severe functional mitral regurgitation, and persistent atrial fibrillation.  Patient had MI then CABG in 2003.  Cardiac cath in 2015 showed only LIMA-LAD and SVG-OM patent.  In 2015, EF was 30-35%.  He had atrial flutter in 2015 and again in 11/16, both times was cardioverted.  After the 11/16 cardioversion, he stopped all his meds.  He was admitted again in 12/16 with atrial fibrillation/RVR and a CHF exacerbation.  He was diuresed and started on Eliquis with plan for DCCV after 4 weeks of anticoagulation.  He was brought back at the end of December for cardioversion, but this was unsuccessful.  TEE in 12/16 showed EF 20-25% with severe functional MR.    He was seen by Dr Cornelius Moraswen with plan for tentative plan for mitral valve repair versus replacement and possible tricuspid valve repair along with MAZE procedure.  He was referred to CHF clinic for optimization prior to surgery.   He went for left and right heart cath. RHC showed good left and right heart filling pressures but cardiac output was low.  Coronary angiography was stable with patent LIMA-LAD and SVG-OM.  Other SVGs occluded.    He had DCCV in 3/17 on amiodarone, this was later stopped.  He had a CPX in 3/17 with only mild functional impairment from CHF.    He had appendicitis with appendectomy in 12/17.   Evaluated [facility] in 07/23/2018. He was in atrial flutter in the setting of a URI and underwent DC-CV.  He was put back on amiodarone. Amiodarone later stopped.   Echo in 7/20 showed EF 45-50% with septal bounce on my review with mild LV  dilation, normal RV size and systolic function, IVC normal.   Echo was done today and reviewed, EF looks like 45-50% with mild LVH, diffuse hypokinesis, normal RV, no MR.   Patient presents for followup of CHF and CAD. Symptomatically, he has been doing well.  No chest pain, no exertional dyspnea. No orthopnea/PND.  He is noted to be back in atrial fibrillation today.  He does not feel it. Main complaint is low back pain/hip pain, no claudication.   ECG (personally reviewed): Atrial fibrillation rate 88  Labs (9/16): LDL 126, HDL 38 Labs (12/16): K 4, creatinine 1.15 Labs (1/17): K 4.4, creatinine 1.09, LDL 42, HDL 39 Labs (10/01/2015): K 4.5 Creatinine 1.27 Dig level 0.4  Labs (4/17): digoxin 0.4, LFTs normal, TSH normal Labs (5/17): K 4.5, creatinine 1.36 Labs (6/17): K 5, creatinine 1.88, BNP 88, LDL 54, HDl 39, digoxin 0.8, LFTs normal, TSH normal Labs (9/17): K 4.8, creatinine 1.3, digoxin 0.6, TSH normal, LFTs normal Labs (12/17): K 3.2, creatinine 1.33, digoxin 0.3 Labs (7/18): K 4.8, digoxin 0.2, creatinine 1.22 Labs (10/18): LDL 41, HDL 39, digoxin 0.3 Labs (5/283/19): K 4.8, creatinine 1.56, hgb 13.9 Labs (4/19): digoxin 0.5, K 4.6, creatinine 1.37 Labs (07/23/2018): K 4.6 Creatinine 1.49, hgb 13.8 Labs (5/20): K 4.5, creatinine 1.4, LDL 39, hgb 13.5 Labs (11/20): K 4.5, creatinine 1.05, hgb 13.4 Labs (2/21): K 4.1, creatinine 1.26, LDL 38, TGs 178  PMH:  1. CAD: s/p MI in 2003.  CABG x 5 in 2003 with LIMA-LAD, SVG-D1, SVG-OM2, seq SVG-PDA/PLV.  LHC/RHC 11/15 with native arteries essentially occluded.  SVG-D1 and seq SVG-PDA/PLV occluded, LIMA and SVG-OM patent.  LHC (1/17) with patent LIMA-LAD, occluded SVG-D, patent SVG-OM => SVG touches down on inferior branch of OM1 that backfills to superior branch of OM1, there are serial 70-80% stenoses in the proximal inferior OM1 branch that may compromise backflow to superior branch OM1, SVG-PDA/PLV totally occluded, EF 25% . 2. Atrial  flutter: DCCV 2015.  Recurred 11/16 with DCCV.   3. Chronic systolic CHF: Ischemic cardiomyopathy.  Echo (2015) with moderate-severe MR, EF 30-35%.  TEE (12/16) with severe central MR (functional), EF 20-25% with diffuse hypokinesis, moderately decreased RV systolic function, moderate TR.  RHC (1/17) with mean RA 5, PA 27/12 mean 18, mean PCWP 10, CI 1.7 Fick/1.83 thermo. EF 25% on 1/17 LV-gram.  Cardiac MRI (2/17) with EF 19%, dilated LV, images not adequate to assess delayed enhancement.  - CPX (3/17) with peak VO2 20.4, VE/VCO2 slope 34, RER 1.11 => mild functional impairment.  - Echo (5/17) with EF 40-45%, trivial MR, mild RV dilation with low normal RV systolic function.  - Echo (5/18) with EF 40-45%, basal to mid inferior HK, moderate LVH, trivial MR.  - Echo (5/19) with EF 55-60%, no significant MR.  - Echo (7/20) with EF 45-50% on my review with mild LV dilation, normal RV size and systolic function, IVC normal, trivial MR.   - Echo (8/21) with EF 45-50% with mild LVH, diffuse hypokinesis, normal RV, no MR.  4. Mitral regurgitation: Severe on 12/16 TEE, but was only trivial on echo in 5/17 and 5/18 with aggressive medical treatment. 5/19 echo with no significant MR. 8/21 echo with no significant MR.  5. OSA: Not using CPAP.  6. HTN 7. Nephrolithiasis 8. Atrial fibrillation/flutter: Noted in 12/16 initially.  Paroxysmal.  Had DCCV to NSR in 3/17.  - Atrial flutter 12/19 => DCCV to NSR, briefly on amiodarone.  9. PFTs (1/17) with minimal obstructive airways disease but severely decreased DLCO (37% predicted), possibly suggestive of pulmonary vascular disease.  10. ABIs (5/17) were normal.  11. CKD 12. Appendectomy 12/17.   Current Outpatient Medications  Medication Sig Dispense Refill  . apixaban (ELIQUIS) 5 MG TABS tablet Take 1 tablet (5 mg total) by mouth 2 (two) times daily. 180 tablet 3  . furosemide (LASIX) 20 MG tablet Take 2 tablets (40 mg total) by mouth as needed (for 3 lb  weight gain over night or 5 lb weight gain in a week). (Patient taking differently: Take 40 mg by mouth as needed for edema (for 3 lb weight gain over night or 5 lb weight gain in a week). ) 90 tablet 6  . isosorbide mononitrate (IMDUR) 30 MG 24 hr tablet Take 1 tablet (30 mg total) by mouth daily. 90 tablet 3  . metoprolol succinate (TOPROL-XL) 50 MG 24 hr tablet Take 1 tablet (50 mg total) by mouth 2 (two) times daily. 60 tablet 6  . rosuvastatin (CRESTOR) 40 MG tablet Take 1 tablet (40 mg total) by mouth daily. (Patient taking differently: Take 40 mg by mouth at bedtime. ) 90 tablet 3  . sacubitril-valsartan (ENTRESTO) 97-103 MG Take 1 tablet by mouth 2 (two) times daily. 60 tablet 5   No current facility-administered medications for this encounter.    Allergies:   Patient has no known allergies.   Social History:  The patient  reports that he quit smoking about 18 years ago. His smoking use included cigarettes. He has a 40.00 pack-year smoking history. He has never used smokeless tobacco. He reports current alcohol use. He reports that he does not use drugs.   Family History:  The patient's family history includes Cancer in his father and mother.   ROS:  Please see the history of present illness.   All other systems are personally reviewed and negative.   Exam:   BP 106/78   Pulse 78   Ht 5\' 11"  (1.803 m)   Wt 100 kg (220 lb 6.4 oz)   SpO2 96%   BMI 30.74 kg/m  General: NAD Neck: No JVD, no thyromegaly or thyroid nodule.  Lungs: Clear to auscultation bilaterally with normal respiratory effort. CV: Nondisplaced PMI.  Heart irregular S1/S2, no S3/S4, no murmur.  No peripheral edema.  No carotid bruit.  Normal pedal pulses.  Abdomen: Soft, nontender, no hepatosplenomegaly, no distention.  Skin: Intact without lesions or rashes.  Neurologic: Alert and oriented x 3.  Psych: Normal affect. Extremities: No clubbing or cyanosis.  HEENT: Normal.   Recent Labs: 03/12/2020: BUN 34;  Creatinine, Ser 1.41; Hemoglobin 15.0; Platelets 222; Potassium 4.4; Sodium 139  Personally reviewed   Wt Readings from Last 3 Encounters:  03/12/20 100 kg (220 lb 6.4 oz)  09/15/19 105.7 kg (233 lb)  06/14/19 100.4 kg (221 lb 6.4 oz)      ASSESSMENT AND PLAN:  1. Chronic systolic CHF: EF 13/10/20 on TEE from 12/16.  cMRI 2/17 with EF 19%, unable to assess for delayed enhancement on this study.  Ischemic cardiomyopathy.  Low cardiac output by RHC in 1/17 but filling pressures optimized.  Despite low output on that RHC, he has been doing quite well with medical treatment.  CPX (3/17) with only mild functional limitation.  Echo today is stable wit EF 45-50%, normal RV size and systolic function, IVC normal, no significant MR.  He is not volume overloaded on exam, weight down 13 lbs, NYHA class II.  - He has not needed Lasix.   - Continue Toprol XL 50 mg bid.   - Continue Entresto 97/103 bid.  BMET today.  - He has had deterioration of LV function in the past with atrial fibrillation, I will arrange for DCCV (see below).   2. CAD: s/p CABG.  On 1/17 cath, only LIMA-LAD and SVG-OM2 still patent. No chest pain.  - No ASA given use of apixaban. - Continue Crestor, good lipids in 2/21.    3. Atrial fibrillation/flutter: Paroxysmal.  Successful DCCV in 3/17 on amiodarone.  Amiodarone stopped, then he had atrial flutter in 12/19 that was cardioverted back to NSR and amiodarone was restarted transiently.  He is back in atrial fibrillation today; he does not feel it and is not sure how long he has been in it.  He had deterioration of his LV function in the past in the setting of atrial fibrillation, I would like him back in NSR.  - Continue apixaban, he has not missed doses.  - Rate control reasonable with Toprol XL.  - I will arrange for DCCV, we discussed risks/benefits and he agrees to procedure.  - I would like him to be evaluated for atrial fibrillation ablation rather than restarting amiodarone to  maintain NSR.  I will refer him to EP for this.   4. Mitral regurgitation: Severe by 12/16 TEE, probably functional.  Much improved on 5/17 echo, only trivial.  Trivial on 5/18  echo. No significant MR on echo 12/2017. Trivial on 7/20 echo. Minimal MR on today's echo.  5. CKD: Stage III. BMET today.    Followup in 1 month.   Signed, Marca Ancona, MD  03/12/2020  [facility] [address] [facility] [place] [address] [phone] (office) [phone] (fax)

## 2020-03-12 NOTE — Progress Notes (Signed)
  Echocardiogram 2D Echocardiogram has been performed.  Stanley Wells 03/12/2020, 8:57 AM

## 2020-03-12 NOTE — H&P (View-Only) (Signed)
   Date:  03/12/2020   ID:  Stanley Wells, DOB 08/11/1945, MRN [id number]   Provider location: [facility] Type of Visit: Established patient   PCP:  Redding, [name], MD  Cardiologist: Dr. Merel Santoli  Chief Complaint: Atrial flutter   History of Present Illness: Stanley Wells is a 74 y.o. male who has a history of CAD, ischemic cardiomyopathy/chronic systolic CHF, severe functional mitral regurgitation, and persistent atrial fibrillation.  Patient had MI then CABG in 2003.  Cardiac cath in 2015 showed only LIMA-LAD and SVG-OM patent.  In 2015, EF was 30-35%.  He had atrial flutter in 2015 and again in 11/16, both times was cardioverted.  After the 11/16 cardioversion, he stopped all his meds.  He was admitted again in 12/16 with atrial fibrillation/RVR and a CHF exacerbation.  He was diuresed and started on Eliquis with plan for DCCV after 4 weeks of anticoagulation.  He was brought back at the end of December for cardioversion, but this was unsuccessful.  TEE in 12/16 showed EF 20-25% with severe functional MR.    He was seen by Dr Owen with plan for tentative plan for mitral valve repair versus replacement and possible tricuspid valve repair along with MAZE procedure.  He was referred to CHF clinic for optimization prior to surgery.   He went for left and right heart cath. RHC showed good left and right heart filling pressures but cardiac output was low.  Coronary angiography was stable with patent LIMA-LAD and SVG-OM.  Other SVGs occluded.    He had DCCV in 3/17 on amiodarone, this was later stopped.  He had a CPX in 3/17 with only mild functional impairment from CHF.    He had appendicitis with appendectomy in 12/17.   [facility]uated [facility] in 07/23/2018. He was in atrial flutter in the setting of a URI and underwent DC-CV.  He was put back on amiodarone. Amiodarone later stopped.   Echo in 7/20 showed EF 45-50% with septal bounce on my review with mild LV  dilation, normal RV size and systolic function, IVC normal.   Echo was done today and reviewed, EF looks like 45-50% with mild LVH, diffuse hypokinesis, normal RV, no MR.   Patient presents for followup of CHF and CAD. Symptomatically, he has been doing well.  No chest pain, no exertional dyspnea. No orthopnea/PND.  He is noted to be back in atrial fibrillation today.  He does not feel it. Main complaint is low back pain/hip pain, no claudication.   ECG (personally reviewed): Atrial fibrillation rate 88  Labs (9/16): LDL 126, HDL 38 Labs (12/16): K 4, creatinine 1.15 Labs (1/17): K 4.4, creatinine 1.09, LDL 42, HDL 39 Labs (10/01/2015): K 4.5 Creatinine 1.27 Dig level 0.4  Labs (4/17): digoxin 0.4, LFTs normal, TSH normal Labs (5/17): K 4.5, creatinine 1.36 Labs (6/17): K 5, creatinine 1.88, BNP 88, LDL 54, HDl 39, digoxin 0.8, LFTs normal, TSH normal Labs (9/17): K 4.8, creatinine 1.3, digoxin 0.6, TSH normal, LFTs normal Labs (12/17): K 3.2, creatinine 1.33, digoxin 0.3 Labs (7/18): K 4.8, digoxin 0.2, creatinine 1.22 Labs (10/18): LDL 41, HDL 39, digoxin 0.3 Labs (3/19): K 4.8, creatinine 1.56, hgb 13.9 Labs (4/19): digoxin 0.5, K 4.6, creatinine 1.37 Labs (07/23/2018): K 4.6 Creatinine 1.49, hgb 13.8 Labs (5/20): K 4.5, creatinine 1.4, LDL 39, hgb 13.5 Labs (11/20): K 4.5, creatinine 1.05, hgb 13.4 Labs (2/21): K 4.1, creatinine 1.26, LDL 38, TGs 178  PMH:   1. CAD: s/p MI in 2003.  CABG x 5 in 2003 with LIMA-LAD, SVG-D1, SVG-OM2, seq SVG-PDA/PLV.  LHC/RHC 11/15 with native arteries essentially occluded.  SVG-D1 and seq SVG-PDA/PLV occluded, LIMA and SVG-OM patent.  LHC (1/17) with patent LIMA-LAD, occluded SVG-D, patent SVG-OM => SVG touches down on inferior branch of OM1 that backfills to superior branch of OM1, there are serial 70-80% stenoses in the proximal inferior OM1 branch that may compromise backflow to superior branch OM1, SVG-PDA/PLV totally occluded, EF 25% . 2. Atrial  flutter: DCCV 2015.  Recurred 11/16 with DCCV.   3. Chronic systolic CHF: Ischemic cardiomyopathy.  Echo (2015) with moderate-severe MR, EF 30-35%.  TEE (12/16) with severe central MR (functional), EF 20-25% with diffuse hypokinesis, moderately decreased RV systolic function, moderate TR.  RHC (1/17) with mean RA 5, PA 27/12 mean 18, mean PCWP 10, CI 1.7 Fick/1.83 thermo. EF 25% on 1/17 LV-gram.  Cardiac MRI (2/17) with EF 19%, dilated LV, images not adequate to assess delayed enhancement.  - CPX (3/17) with peak VO2 20.4, VE/VCO2 slope 34, RER 1.11 => mild functional impairment.  - Echo (5/17) with EF 40-45%, trivial MR, mild RV dilation with low normal RV systolic function.  - Echo (5/18) with EF 40-45%, basal to mid inferior HK, moderate LVH, trivial MR.  - Echo (5/19) with EF 55-60%, no significant MR.  - Echo (7/20) with EF 45-50% on my review with mild LV dilation, normal RV size and systolic function, IVC normal, trivial MR.   - Echo (8/21) with EF 45-50% with mild LVH, diffuse hypokinesis, normal RV, no MR.  4. Mitral regurgitation: Severe on 12/16 TEE, but was only trivial on echo in 5/17 and 5/18 with aggressive medical treatment. 5/19 echo with no significant MR. 8/21 echo with no significant MR.  5. OSA: Not using CPAP.  6. HTN 7. Nephrolithiasis 8. Atrial fibrillation/flutter: Noted in 12/16 initially.  Paroxysmal.  Had DCCV to NSR in 3/17.  - Atrial flutter 12/19 => DCCV to NSR, briefly on amiodarone.  9. PFTs (1/17) with minimal obstructive airways disease but severely decreased DLCO (37% predicted), possibly suggestive of pulmonary vascular disease.  10. ABIs (5/17) were normal.  11. CKD 12. Appendectomy 12/17.   Current Outpatient Medications  Medication Sig Dispense Refill  . apixaban (ELIQUIS) 5 MG TABS tablet Take 1 tablet (5 mg total) by mouth 2 (two) times daily. 180 tablet 3  . furosemide (LASIX) 20 MG tablet Take 2 tablets (40 mg total) by mouth as needed (for 3 lb  weight gain over night or 5 lb weight gain in a week). (Patient taking differently: Take 40 mg by mouth as needed for edema (for 3 lb weight gain over night or 5 lb weight gain in a week). ) 90 tablet 6  . isosorbide mononitrate (IMDUR) 30 MG 24 hr tablet Take 1 tablet (30 mg total) by mouth daily. 90 tablet 3  . metoprolol succinate (TOPROL-XL) 50 MG 24 hr tablet Take 1 tablet (50 mg total) by mouth 2 (two) times daily. 60 tablet 6  . rosuvastatin (CRESTOR) 40 MG tablet Take 1 tablet (40 mg total) by mouth daily. (Patient taking differently: Take 40 mg by mouth at bedtime. ) 90 tablet 3  . sacubitril-valsartan (ENTRESTO) 97-103 MG Take 1 tablet by mouth 2 (two) times daily. 60 tablet 5   No current facility-administered medications for this encounter.    Allergies:   Patient has no known allergies.   Social History:  The patient  reports that he quit smoking about 18 years ago. His smoking use included cigarettes. He has a 40.00 pack-year smoking history. He has never used smokeless tobacco. He reports current alcohol use. He reports that he does not use drugs.   Family History:  The patient's family history includes Cancer in his father and mother.   ROS:  Please see the history of present illness.   All other systems are personally reviewed and negative.   Exam:   BP 106/78   Pulse 78   Ht 5\' 11"  (1.803 m)   Wt 100 kg (220 lb 6.4 oz)   SpO2 96%   BMI 30.74 kg/m  General: NAD Neck: No JVD, no thyromegaly or thyroid nodule.  Lungs: Clear to auscultation bilaterally with normal respiratory effort. CV: Nondisplaced PMI.  Heart irregular S1/S2, no S3/S4, no murmur.  No peripheral edema.  No carotid bruit.  Normal pedal pulses.  Abdomen: Soft, nontender, no hepatosplenomegaly, no distention.  Skin: Intact without lesions or rashes.  Neurologic: Alert and oriented x 3.  Psych: Normal affect. Extremities: No clubbing or cyanosis.  HEENT: Normal.   Recent Labs: 03/12/2020: BUN 34;  Creatinine, Ser 1.41; Hemoglobin 15.0; Platelets 222; Potassium 4.4; Sodium 139  Personally reviewed   Wt Readings from Last 3 Encounters:  03/12/20 100 kg (220 lb 6.4 oz)  09/15/19 105.7 kg (233 lb)  06/14/19 100.4 kg (221 lb 6.4 oz)      ASSESSMENT AND PLAN:  1. Chronic systolic CHF: EF 13/10/20 on TEE from 12/16.  cMRI 2/17 with EF 19%, unable to assess for delayed enhancement on this study.  Ischemic cardiomyopathy.  Low cardiac output by RHC in 1/17 but filling pressures optimized.  Despite low output on that RHC, he has been doing quite well with medical treatment.  CPX (3/17) with only mild functional limitation.  Echo today is stable wit EF 45-50%, normal RV size and systolic function, IVC normal, no significant MR.  He is not volume overloaded on exam, weight down 13 lbs, NYHA class II.  - He has not needed Lasix.   - Continue Toprol XL 50 mg bid.   - Continue Entresto 97/103 bid.  BMET today.  - He has had deterioration of LV function in the past with atrial fibrillation, I will arrange for DCCV (see below).   2. CAD: s/p CABG.  On 1/17 cath, only LIMA-LAD and SVG-OM2 still patent. No chest pain.  - No Stanley given use of apixaban. - Continue Crestor, good lipids in 2/21.    3. Atrial fibrillation/flutter: Paroxysmal.  Successful DCCV in 3/17 on amiodarone.  Amiodarone stopped, then he had atrial flutter in 12/19 that was cardioverted back to NSR and amiodarone was restarted transiently.  He is back in atrial fibrillation today; he does not feel it and is not sure how long he has been in it.  He had deterioration of his LV function in the past in the setting of atrial fibrillation, I would like him back in NSR.  - Continue apixaban, he has not missed doses.  - Rate control reasonable with Toprol XL.  - I will arrange for DCCV, we discussed risks/benefits and he agrees to procedure.  - I would like him to be evaluated for atrial fibrillation ablation rather than restarting amiodarone to  maintain NSR.  I will refer him to EP for this.   4. Mitral regurgitation: Severe by 12/16 TEE, probably functional.  Much improved on 5/17 echo, only trivial.  Trivial on 5/18  echo. No significant MR on echo 12/2017. Trivial on 7/20 echo. Minimal MR on today's echo.  5. CKD: Stage III. BMET today.    Followup in 1 month.   Signed, Marca Ancona, MD  03/12/2020  [facility] [address] [facility] [place] [address] [phone] (office) [phone] (fax)

## 2020-03-15 ENCOUNTER — Ambulatory Visit (HOSPITAL_COMMUNITY)
Admission: RE | Admit: 2020-03-15 | Discharge: 2020-03-15 | Disposition: A | Discharge status: Home / Self Care | Payer: Medicare Other | Attending: Cardiology | Admitting: Cardiology

## 2020-03-15 ENCOUNTER — Encounter (HOSPITAL_COMMUNITY)
Admission: RE | Disposition: A | Discharge status: Home / Self Care | Payer: Self-pay | Source: Home / Self Care | Attending: Cardiology

## 2020-03-15 ENCOUNTER — Ambulatory Visit (HOSPITAL_COMMUNITY): Payer: Medicare Other | Admitting: Certified Registered Nurse Anesthetist

## 2020-03-15 ENCOUNTER — Encounter (HOSPITAL_COMMUNITY): Payer: Self-pay | Admitting: Cardiology

## 2020-03-15 DIAGNOSIS — I255 Ischemic cardiomyopathy: Secondary | ICD-10-CM | POA: Diagnosis not present

## 2020-03-15 DIAGNOSIS — Z951 Presence of aortocoronary bypass graft: Secondary | ICD-10-CM | POA: Insufficient documentation

## 2020-03-15 DIAGNOSIS — N183 Chronic kidney disease, stage 3 unspecified: Secondary | ICD-10-CM | POA: Diagnosis not present

## 2020-03-15 DIAGNOSIS — I5043 Acute on chronic combined systolic (congestive) and diastolic (congestive) heart failure: Secondary | ICD-10-CM | POA: Diagnosis not present

## 2020-03-15 DIAGNOSIS — I130 Hypertensive heart and chronic kidney disease with heart failure and stage 1 through stage 4 chronic kidney disease, or unspecified chronic kidney disease: Secondary | ICD-10-CM | POA: Diagnosis not present

## 2020-03-15 DIAGNOSIS — I340 Nonrheumatic mitral (valve) insufficiency: Secondary | ICD-10-CM | POA: Insufficient documentation

## 2020-03-15 DIAGNOSIS — G4733 Obstructive sleep apnea (adult) (pediatric): Secondary | ICD-10-CM | POA: Diagnosis not present

## 2020-03-15 DIAGNOSIS — I5022 Chronic systolic (congestive) heart failure: Secondary | ICD-10-CM | POA: Diagnosis not present

## 2020-03-15 DIAGNOSIS — I2510 Atherosclerotic heart disease of native coronary artery without angina pectoris: Secondary | ICD-10-CM | POA: Insufficient documentation

## 2020-03-15 DIAGNOSIS — I110 Hypertensive heart disease with heart failure: Secondary | ICD-10-CM | POA: Diagnosis not present

## 2020-03-15 DIAGNOSIS — I4892 Unspecified atrial flutter: Secondary | ICD-10-CM | POA: Diagnosis not present

## 2020-03-15 DIAGNOSIS — I4891 Unspecified atrial fibrillation: Secondary | ICD-10-CM | POA: Diagnosis not present

## 2020-03-15 DIAGNOSIS — Z79899 Other long term (current) drug therapy: Secondary | ICD-10-CM | POA: Diagnosis not present

## 2020-03-15 DIAGNOSIS — I480 Paroxysmal atrial fibrillation: Secondary | ICD-10-CM | POA: Diagnosis not present

## 2020-03-15 DIAGNOSIS — Z7901 Long term (current) use of anticoagulants: Secondary | ICD-10-CM | POA: Diagnosis not present

## 2020-03-15 DIAGNOSIS — Z87891 Personal history of nicotine dependence: Secondary | ICD-10-CM | POA: Diagnosis not present

## 2020-03-15 DIAGNOSIS — E785 Hyperlipidemia, unspecified: Secondary | ICD-10-CM | POA: Diagnosis not present

## 2020-03-15 DIAGNOSIS — I252 Old myocardial infarction: Secondary | ICD-10-CM | POA: Diagnosis not present

## 2020-03-15 DIAGNOSIS — I4819 Other persistent atrial fibrillation: Secondary | ICD-10-CM | POA: Diagnosis not present

## 2020-03-15 HISTORY — PX: CARDIOVERSION: SHX1299

## 2020-03-15 SURGERY — CARDIOVERSION
Anesthesia: General

## 2020-03-15 MED ORDER — LIDOCAINE 2% (20 MG/ML) 5 ML SYRINGE
INTRAMUSCULAR | Status: DC | PRN
  Administered 2020-03-15: 40 mg via INTRAVENOUS

## 2020-03-15 MED ORDER — SODIUM CHLORIDE 0.9 % IV SOLN: INTRAVENOUS | Status: DC

## 2020-03-15 MED ORDER — PROPOFOL 10 MG/ML IV BOLUS
INTRAVENOUS | Status: DC | PRN
  Administered 2020-03-15: 80 mg via INTRAVENOUS

## 2020-03-15 NOTE — Transfer of Care (Signed)
Immediate Anesthesia Transfer of Care Note  Patient: Stanley Wells  Procedure(s) Performed: CARDIOVERSION (N/A )  Patient Location: Endoscopy Unit  Anesthesia Type:General  Level of Consciousness: awake, alert  and oriented  Airway & Oxygen Therapy: Patient Spontanous Breathing  Post-op Assessment: Report given to RN, Post -op Vital signs reviewed and stable and Patient moving all extremities  Post vital signs: Reviewed and stable  Last Vitals:  Vitals Value Taken Time  BP    Temp    Pulse    Resp    SpO2      Last Pain:  Vitals:   03/15/20 0830  TempSrc: Tympanic  PainSc: 0-No pain         Complications: No complications documented.

## 2020-03-15 NOTE — Discharge Instructions (Signed)
Electrical Cardioversion Electrical cardioversion is the delivery of a jolt of electricity to restore a normal rhythm to the heart. A rhythm that is too fast or is not regular keeps the heart from pumping well. In this procedure, sticky patches or metal paddles are placed on the chest to deliver electricity to the heart from a device.  Follow these instructions at home:  Do not drive for 24 hours if you were given a sedative during your procedure.  Take over-the-counter and prescription medicines only as told by your health care provider.  Ask your health care provider how to check your pulse. Check it often.  Rest for 48 hours after the procedure or as told by your health care provider.  Avoid or limit your caffeine use as told by your health care provider.  Keep all follow-up visits as told by your health care provider. This is important. Contact a health care provider if:  You feel like your heart is beating too quickly or your pulse is not regular.  You have a serious muscle cramp that does not go away. Get help right away if:  You have discomfort in your chest.  You are dizzy or you feel faint.  You have trouble breathing or you are short of breath.  Your speech is slurred.  You have trouble moving an arm or leg on one side of your body.  Your fingers or toes turn cold or blue. Summary  Electrical cardioversion is the delivery of a jolt of electricity to restore a normal rhythm to the heart.  This procedure may be done right away in an emergency or may be a scheduled procedure if the condition is not an emergency.  Generally, this is a safe procedure.  After the procedure, check your pulse often as told by your health care provider. This information is not intended to replace advice given to you by your health care provider. Make sure you discuss any questions you have with your health care provider. Document Revised: 02/21/2019 Document Reviewed: 02/21/2019 Elsevier  Patient Education  2020 [employer]  

## 2020-03-15 NOTE — Interval H&P Note (Signed)
History and Physical Interval Note:  03/15/2020 8:59 AM  Stanley Wells  has presented today for surgery, with the diagnosis of AFIB.  The various methods of treatment have been discussed with the patient and family. After consideration of risks, benefits and other options for treatment, the patient has consented to  Procedure(s): CARDIOVERSION (N/A) as a surgical intervention.  The patient's history has been reviewed, patient examined, no change in status, stable for surgery.  I have reviewed the patient's chart and labs.  Questions were answered to the patient's satisfaction.     Nikiah Goin [employer]

## 2020-03-15 NOTE — Procedures (Signed)
Electrical Cardioversion Procedure Note Stanley Wells [id number] 1945/12/30  Procedure: Electrical Cardioversion Indications:  Atrial Fibrillation  Procedure Details Consent: Risks of procedure as well as the alternatives and risks of each were explained to the (patient/caregiver).  Consent for procedure obtained. Time Out: Verified patient identification, verified procedure, site/side was marked, verified correct patient position, special equipment/implants available, medications/allergies/relevent history reviewed, required imaging and test results available.  Performed  Patient placed on cardiac monitor, pulse oximetry, supplemental oxygen as necessary.  Sedation given: Per anesthesiology Pacer pads placed anterior and posterior chest.  Cardioverted 1 time(s).  Cardioverted at 200J.  Evaluation Findings: Post procedure EKG shows: NSR Complications: None Patient did tolerate procedure well.   Marca Ancona 03/15/2020, 9:13 AM

## 2020-03-15 NOTE — Anesthesia Postprocedure Evaluation (Signed)
Anesthesia Post Note  Patient: Stanley Wells  Procedure(s) Performed: CARDIOVERSION (N/A )     Patient location during evaluation: PACU Anesthesia Type: General Level of consciousness: awake and alert Pain management: pain level controlled Vital Signs Assessment: post-procedure vital signs reviewed and stable Respiratory status: spontaneous breathing, nonlabored ventilation, respiratory function stable and patient connected to nasal cannula oxygen Cardiovascular status: blood pressure returned to baseline and stable Postop Assessment: no apparent nausea or vomiting Anesthetic complications: no   No complications documented.  Last Vitals:  Vitals:   03/15/20 0920 03/15/20 0930  BP: 105/71 103/86  Pulse: 64 63  Resp: 18 (!) 25  Temp:    SpO2: 97% 98%    Last Pain:  Vitals:   03/15/20 0930  TempSrc:   PainSc: 0-No pain                 Jocob Dambach [employer]

## 2020-03-15 NOTE — Anesthesia Preprocedure Evaluation (Signed)
Anesthesia Evaluation  Patient identified by MRN, date of birth, ID band Patient awake    Reviewed: Allergy & Precautions, NPO status , Patient's Chart, lab work & pertinent test results  Airway Mallampati: II  TM Distance: >3 FB Neck ROM: Full    Dental  (+) Teeth Intact, Dental Advisory Given   Pulmonary former smoker,    breath sounds clear to auscultation       Cardiovascular  Rhythm:Irregular Rate:Normal     Neuro/Psych    GI/Hepatic   Endo/Other    Renal/GU      Musculoskeletal   Abdominal   Peds  Hematology   Anesthesia Other Findings   Reproductive/Obstetrics                             Anesthesia Physical Anesthesia Plan  ASA: III  Anesthesia Plan: General   Post-op Pain Management:    Induction: Intravenous  PONV Risk Score and Plan:   Airway Management Planned: Mask  Additional Equipment:   Intra-op Plan:   Post-operative Plan:   Informed Consent: I have reviewed the patients History and Physical, chart, labs and discussed the procedure including the risks, benefits and alternatives for the proposed anesthesia with the patient or authorized representative who has indicated his/her understanding and acceptance.     Dental advisory given  Plan Discussed with: CRNA and Anesthesiologist  Anesthesia Plan Comments:         Anesthesia Quick Evaluation

## 2020-03-26 ENCOUNTER — Other Ambulatory Visit (HOSPITAL_COMMUNITY): Payer: Self-pay | Admitting: Cardiology

## 2020-03-27 NOTE — H&P (View-Only) (Signed)
Cardiology Office Note:    Date:  03/28/2020   ID:  Stanley Wells, DOB 1946/04/23, MRN [id number]  PCP:  [name], MD  [facility] Cardiologist:  No primary care provider on file.  [facility] Electrophysiologist:  Lanier Prude, MD   Referring MD: Laurey Morale, MD   No chief complaint on file. af  History of Present Illness:    Stanley Wells is a 74 y.o. male with a hx of CAD, ICM, chronic systolic HF, persistent AF who presents to the clinic for evaluation of his AF and consideration for possible AF ablation.   Episodes of uncontrolled atrial fibrillation are correlated to his decreased LV function and worsening of his mitral regurgitation.  He was recently admitted on August 12 for cardioversion but he is already back in atrial fibrillation during today's visit.  He can feel the atrial fibrillation with palpitations and worsening of his heart failure symptoms.  He takes Eliquis for stroke prophylaxis without any bleeding troubles.  His wife was with him today tells me that he does not take his diuretic routinely although has a supply at home.  Past Medical History:  Diagnosis Date  . Atrial flutter with rapid ventricular response (HCC) 07/23/2018  . CAD (coronary artery disease)    a. s/p CABG 2003  b. s/p acute MI prior to CABG w/ EF <30%  . Cardiomyopathy, ischemic: EF 20-25%   . Cholecystitis   . Chronic combined systolic and diastolic CHF (congestive heart failure) (HCC)   . Chronic systolic CHF (congestive heart failure) (HCC)    a. 2D ECHO: 05/17/2014: EF 30-35%;  b. 07/2015 TEE: EF 20-25%, diff HK, sev MR, sev dil LA w/o thrombus, mod reduced RV fxn, mod dil RA, mod TR.  Marland Kitchen Hyperlipidemia   . Hypertensive heart disease   . Kidney stones X 1   "passed it" (08/02/2015)  . OSA (obstructive sleep apnea) 10/11/2014   refuses to use CPAP  . Persistent atrial fibrillation (HCC)    a. s/p succesful TEE/DCCV on 06/01/14.  b. on Eliquis;  c. 07/2015  recurrent AF-->Failed DCCV.  . S/P CABG x 5 02/16/2002   LIMA to LAD, SVG to D1, SVG to OM2, Sequential SVG to PDA-RPL, saphenous vein harvest from right thigh and lower leg  . Severe mitral regurgitation    Functional, improved with improvement in his EF.  . Tricuspid regurgitation     Past Surgical History:  Procedure Laterality Date  . CARDIAC CATHETERIZATION N/A 06/19/2014   Procedure: RIGHT/LEFT HEART CATH AND CORONARY/GRAFT ANGIOGRAPHY;  Surgeon: Lennette Bihari, MD;  Location: [facility] CATH LAB;  Service: Cardiovascular;  Laterality: N/A;  . CARDIAC CATHETERIZATION  2003  . CARDIAC CATHETERIZATION N/A 08/28/2015   Procedure: Right Heart Cath and Coronary/Graft Angiography;  Surgeon: Laurey Morale, MD;  Location: [facility] INVASIVE CV LAB;  Service: Cardiovascular;  Laterality: N/A;  . CARDIOVERSION N/A 06/01/2014   Procedure: CARDIOVERSION;  Surgeon: Lars Masson, MD;  Location: [facility] ENDOSCOPY;  Service: Cardiovascular;  Laterality: N/A;  . CARDIOVERSION  06/11/2015; 08/02/2015  . CARDIOVERSION N/A 08/02/2015   Procedure: CARDIOVERSION;  Surgeon: Wendall Stade, MD;  Location: [facility] ENDOSCOPY;  Service: Cardiovascular;  Laterality: N/A;  . CARDIOVERSION N/A 10/22/2015   Procedure: CARDIOVERSION;  Surgeon: Laurey Morale, MD;  Location: [facility] ENDOSCOPY;  Service: Cardiovascular;  Laterality: N/A;  . CARDIOVERSION N/A 03/15/2020   Procedure: CARDIOVERSION;  Surgeon: Laurey Morale, MD;  Location: [facility] ENDOSCOPY;  Service: Cardiovascular;  Laterality: N/A;  . CORONARY ARTERY BYPASS GRAFT  02/16/2002   CABG X5  . LAPAROSCOPIC APPENDECTOMY N/A 07/20/2016   Procedure: APPENDECTOMY LAPAROSCOPIC;  Surgeon: Manus Rudd, MD;  Location: [facility] OR;  Service: General;  Laterality: N/A;  . TEE WITHOUT CARDIOVERSION N/A 06/01/2014   Procedure: TRANSESOPHAGEAL ECHOCARDIOGRAM (TEE);  Surgeon: Lars Masson, MD;  Location: [facility] ENDOSCOPY;  Service: Cardiovascular;  Laterality: N/A;  . TEE WITHOUT CARDIOVERSION N/A  08/03/2015   Procedure: TRANSESOPHAGEAL ECHOCARDIOGRAM (TEE);  Surgeon: Lars Masson, MD;  Location: [facility] ENDOSCOPY;  Service: Cardiovascular;  Laterality: N/A;    Current Medications: Current Meds  Medication Sig  . apixaban (ELIQUIS) 5 MG TABS tablet Take 1 tablet (5 mg total) by mouth 2 (two) times daily.  Marland Kitchen ENTRESTO 97-103 MG TAKE 1 TABLET BY MOUTH TWICE DAILY  . isosorbide mononitrate (IMDUR) 30 MG 24 hr tablet Take 1 tablet (30 mg total) by mouth daily.  . metoprolol succinate (TOPROL-XL) 50 MG 24 hr tablet Take 1 tablet (50 mg total) by mouth 2 (two) times daily.  . rosuvastatin (CRESTOR) 40 MG tablet Take 1 tablet (40 mg total) by mouth daily.     Allergies:   Patient has no known allergies.   Social History   Socioeconomic History  . Marital status: Married    Spouse name: Not on file  . Number of children: Not on file  . Years of education: Not on file  . Highest education level: Not on file  Occupational History  . Occupation: Retired  Tobacco Use  . Smoking status: Former Smoker    Packs/day: 1.00    Years: 40.00    Pack years: 40.00    Types: Cigarettes    Quit date: 02/02/2002    Years since quitting: 18.1  . Smokeless tobacco: Never Used  Substance and Sexual Activity  . Alcohol use: Yes    Comment: 08/02/2015 "I rarely have a beer"  . Drug use: No  . Sexual activity: Yes  Other Topics Concern  . Not on file  Social History Narrative   Lives in [place] with wife.   Social Determinants of Health   Financial Resource Strain:   . Difficulty of Paying Living Expenses: Not on file  Food Insecurity:   . Worried About Programme researcher, broadcasting/film/video in the Last Year: Not on file  . Ran Out of Food in the Last Year: Not on file  Transportation Needs:   . Lack of Transportation (Medical): Not on file  . Lack of Transportation (Non-Medical): Not on file  Physical Activity:   . Days of Exercise per Week: Not on file  . Minutes of Exercise per Session: Not on file    Stress:   . Feeling of Stress : Not on file  Social Connections:   . Frequency of Communication with Friends and Family: Not on file  . Frequency of Social Gatherings with Friends and Family: Not on file  . Attends Religious Services: Not on file  . Active Member of Clubs or Organizations: Not on file  . Attends Banker Meetings: Not on file  . Marital Status: Not on file     Family History: The patient's family history includes Cancer in his father and mother. There is no history of Hypertension or Stroke.  ROS:   Please see the history of present illness.     All other systems reviewed and are negative.  EKGs/Labs/Other Studies Reviewed:    The following studies were reviewed today:  Echo ,ECG  03/12/2020 Echo 1. Left ventricular ejection fraction, by estimation, is 45 to 50%. The  left ventricle has mildly decreased function. The left ventricle  demonstrates global hypokinesis. There is mild left ventricular  hypertrophy. Left ventricular diastolic parameters  are indeterminate.  2. Right ventricular systolic function is normal. The right ventricular  size is normal. Tricuspid regurgitation signal is inadequate for assessing  PA pressure.  3. Left atrial size was mildly dilated.  4. The mitral valve is normal in structure. No evidence of mitral valve  regurgitation. No evidence of mitral stenosis.  5. The aortic valve is tricuspid. Aortic valve regurgitation is not  visualized. Mild aortic valve sclerosis is present, with no evidence of  aortic valve stenosis.  6. The inferior vena cava is normal in size with greater than 50%  respiratory variability, suggesting right atrial pressure of 3 mmHg.  7. The patient is in atrial fibrillation.           EKG:  EKG is ordered today.  The ekg ordered today demonstrates atrial fibrillation.  Recent Labs: 03/12/2020: BUN 34; Creatinine, Ser 1.41; Hemoglobin 15.0; Platelets 222; Potassium 4.4; Sodium 139   Recent Lipid Panel    Component Value Date/Time   CHOL 114 09/15/2019 1003   TRIG 178 (H) 09/15/2019 1003   HDL 40 (L) 09/15/2019 1003   CHOLHDL 2.9 09/15/2019 1003   VLDL 36 09/15/2019 1003   LDLCALC 38 09/15/2019 1003    Physical Exam:    VS:  BP 124/82   Pulse 91   Ht 5\' 11"  (1.803 m)   Wt 229 lb (103.9 kg)   SpO2 95%   BMI 31.94 kg/m     Wt Readings from Last 3 Encounters:  03/28/20 229 lb (103.9 kg)  03/15/20 217 lb (98.4 kg)  03/12/20 220 lb 6.4 oz (100 kg)     GEN:  Well nourished, well developed in no acute distress HEENT: Normal NECK: No JVD; No carotid bruits LYMPHATICS: No lymphadenopathy CARDIAC: irregularly irregular, no murmurs, rubs, gallops RESPIRATORY:  Clear to auscultation without rales, wheezing or rhonchi  ABDOMEN: Soft, non-tender, non-distended MUSCULOSKELETAL: Trace to 1+ pitting edema in bilateral lower extremities to the lower third of his calves.; No deformity  SKIN: Warm and dry NEUROLOGIC:  Alert and oriented x 3 PSYCHIATRIC:  Normal affect   ASSESSMENT:    1. Persistent atrial fibrillation (HCC)   2. Atrial flutter with rapid ventricular response (HCC)   3. Chronic combined systolic and diastolic CHF (congestive heart failure) (HCC)   4. S/P CABG x 5   5. Atrial fibrillation, unspecified type (HCC)    PLAN:    In order of problems listed above:  1. Persistent atrial fibrillation, atrial flutter The patient has persistent atrial fibrillation and flutter which are contributing to his decreased LV function and functional mitral vegetation.  Based on his history I do think he would be a reasonable candidate for atrial fibrillation ablation and a CTI ablation.  I discussed the procedure at length with him and his wife and both wish to proceed with scheduling.  I discussed the option of antiarrhythmic therapy with the patient as well but he would like to pursue ablation therapy as first-line.   Plan for the procedure will be to perform  pulmonary vein isolation, posterior wall ablation, CTI line ablation.  Risk, benefits, and alternatives to EP study and radiofrequency ablation for afib were also discussed in detail today. These risks include but are not limited to  stroke, bleeding, vascular damage, tamponade, perforation, damage to the esophagus, lungs, and other structures, pulmonary vein stenosis, worsening renal function, and death. The patient understands these risk and wishes to proceed.  We will therefore proceed with catheter ablation at the next available time.  Carto, ICE, anesthesia are requested for the procedure.  Will also obtain CT PV protocol prior to the procedure to exclude LAA thrombus and further evaluate atrial anatomy.    2. Chronic combined systolic and diastolic heart failure The patient's uncontrolled ventricular rates with atrial fibrillation are contributing to the patient's heart failure symptoms and decreased LV function and functional mitral vegetation.  He appears to be mildly fluid overloaded with pitting edema in his bilateral lower extremities.  I suspect this is due to him being back in atrial fibrillation.  I asked him to take his Lasix tomorrow morning and I encouraged him to continue with daily weights.   Medication Adjustments/Labs and Tests Ordered: Current medicines are reviewed at length with the patient today.  Concerns regarding medicines are outlined above.  Orders Placed This Encounter  Procedures  . CT CARDIAC MORPH/PULM VEIN W/CM&W/O CA SCORE   Meds ordered this encounter  Medications  . metoprolol tartrate (LOPRESSOR) 100 MG tablet    Sig: Take one tablet by mouth 2 hours prior to cardiac CT    Dispense:  1 tablet    Refill:  0    Patient Instructions  Medication Instructions:  Your physician recommends that you continue on your current medications as directed. Please refer to the Current Medication list given to you today.  Labwork: None  ordered.  Testing/Procedures: None ordered.  Follow-Up:  SEE INSTRUCTION LETTER  Any Other Special Instructions Will Be Listed Below (If Applicable).  If you need a refill on your cardiac medications before your next appointment, please call your pharmacy.    Cardiac Ablation Cardiac ablation is a procedure to disable (ablate) a small amount of heart tissue in very specific places. The heart has many electrical connections. Sometimes these connections are abnormal and can cause the heart to beat very fast or irregularly. Ablating some of the problem areas can improve the heart rhythm or return it to normal. Ablation may be done for people who:  Have Wolff-Parkinson-White syndrome.  Have fast heart rhythms (tachycardia).  Have taken medicines for an abnormal heart rhythm (arrhythmia) that were not effective or caused side effects.  Have a high-risk heartbeat that may be life-threatening. During the procedure, a small incision is made in the neck or the groin, and a long, thin, flexible tube (catheter) is inserted into the incision and moved to the heart. Small devices (electrodes) on the tip of the catheter will send out electrical currents. A type of X-ray (fluoroscopy) will be used to help guide the catheter and to provide images of the heart. Tell a health care provider about:  Any allergies you have.  All medicines you are taking, including vitamins, herbs, eye drops, creams, and over-the-counter medicines.  Any problems you or family members have had with anesthetic medicines.  Any blood disorders you have.  Any surgeries you have had.  Any medical conditions you have, such as kidney failure.  Whether you are pregnant or may be pregnant. What are the risks? Generally, this is a safe procedure. However, problems may occur, including:  Infection.  Bruising and bleeding at the catheter insertion site.  Bleeding into the chest, especially into the sac that surrounds the  heart. This is a serious complication.  Stroke or blood clots.  Damage to other structures or organs.  Allergic reaction to medicines or dyes.  Need for a permanent pacemaker if the normal electrical system is damaged. A pacemaker is a small computer that sends electrical signals to the heart and helps your heart beat normally.  The procedure not being fully effective. This may not be recognized until months later. Repeat ablation procedures are sometimes required. What happens before the procedure?  Follow instructions from your health care provider about eating or drinking restrictions.  Ask your health care provider about: ? Changing or stopping your regular medicines. This is especially important if you are taking diabetes medicines or blood thinners. ? Taking medicines such as aspirin and ibuprofen. These medicines can thin your blood. Do not take these medicines before your procedure if your health care provider instructs you not to.  Plan to have someone take you home from the hospital or clinic.  If you will be going home right after the procedure, plan to have someone with you for 24 hours. What happens during the procedure?  To lower your risk of infection: ? Your health care team will wash or sanitize their hands. ? Your skin will be washed with soap. ? Hair may be removed from the incision area.  An IV tube will be inserted into one of your veins.  You will be given a medicine to help you relax (sedative).  The skin on your neck or groin will be numbed.  An incision will be made in your neck or your groin.  A needle will be inserted through the incision and into a large vein in your neck or groin.  A catheter will be inserted into the needle and moved to your heart.  Dye may be injected through the catheter to help your surgeon see the area of the heart that needs treatment.  Electrical currents will be sent from the catheter to ablate heart tissue in desired  areas. There are three types of energy that may be used to ablate heart tissue: ? Heat (radiofrequency energy). ? Laser energy. ? Extreme cold (cryoablation).  When the necessary tissue has been ablated, the catheter will be removed.  Pressure will be held on the catheter insertion area to prevent excessive bleeding.  A bandage (dressing) will be placed over the catheter insertion area. The procedure may vary among health care providers and hospitals. What happens after the procedure?  Your blood pressure, heart rate, breathing rate, and blood oxygen level will be monitored until the medicines you were given have worn off.  Your catheter insertion area will be monitored for bleeding. You will need to lie still for a few hours to ensure that you do not bleed from the catheter insertion area.  Do not drive for 24 hours or as long as directed by your health care provider. Summary  Cardiac ablation is a procedure to disable (ablate) a small amount of heart tissue in very specific places. Ablating some of the problem areas can improve the heart rhythm or return it to normal.  During the procedure, electrical currents will be sent from the catheter to ablate heart tissue in desired areas. This information is not intended to replace advice given to you by your health care provider. Make sure you discuss any questions you have with your health care provider. Document Revised: 01/11/2018 Document Reviewed: 06/09/2016 Elsevier Patient Education  2020 [employer]      Signed, Lanier Prudeameron T Arzell Mcgeehan, MD  03/28/2020 5:07  PM    [facility]    Anticoagulation instructions: Pt to hold Eliquis for 1 dose(s) prior to procedure and we will plan to resume the day of the procedure unless otherwise instructed after surgery.  Medication instructions morning of: The patient should hold ALL medications the morning of the procedure   Discharge: Our plan will be to discharge the  patient same day after a period of observation

## 2020-03-27 NOTE — Progress Notes (Signed)
Cardiology Office Note:    Date:  03/28/2020   ID:  Dawsen C Ordway, DOB 01/06/1946, MRN [id number]  PCP:  Redding, [name], MD  [facility] Cardiologist:  No primary care provider on file.  [facility] Electrophysiologist:  Mazell Aylesworth T Kynadie Yaun, MD   Referring MD: McLean, Dalton S, MD   No chief complaint on file. af  History of Present Illness:    Stanley Wells is a 74 y.o. male with a hx of CAD, ICM, chronic systolic HF, persistent AF who presents to the clinic for evaluation of his AF and consideration for possible AF ablation.   Episodes of uncontrolled atrial fibrillation are correlated to his decreased LV function and worsening of his mitral regurgitation.  He was recently admitted on August 12 for cardioversion but he is already back in atrial fibrillation during today's visit.  He can feel the atrial fibrillation with palpitations and worsening of his heart failure symptoms.  He takes Eliquis for stroke prophylaxis without any bleeding troubles.  His wife was with him today tells me that he does not take his diuretic routinely although has a supply at home.  Past Medical History:  Diagnosis Date  . Atrial flutter with rapid ventricular response (HCC) 07/23/2018  . CAD (coronary artery disease)    a. s/p CABG 2003  b. s/p acute MI prior to CABG w/ EF <30%  . Cardiomyopathy, ischemic: EF 20-25%   . Cholecystitis   . Chronic combined systolic and diastolic CHF (congestive heart failure) (HCC)   . Chronic systolic CHF (congestive heart failure) (HCC)    a. 2D ECHO: 05/17/2014: EF 30-35%;  b. 07/2015 TEE: EF 20-25%, diff HK, sev MR, sev dil LA w/o thrombus, mod reduced RV fxn, mod dil RA, mod TR.  . Hyperlipidemia   . Hypertensive heart disease   . Kidney stones X 1   "passed it" (08/02/2015)  . OSA (obstructive sleep apnea) 10/11/2014   refuses to use CPAP  . Persistent atrial fibrillation (HCC)    a. s/p succesful TEE/DCCV on 06/01/14.  b. on Eliquis;  c. 07/2015  recurrent AF-->Failed DCCV.  . S/P CABG x 5 02/16/2002   LIMA to LAD, SVG to D1, SVG to OM2, Sequential SVG to PDA-RPL, saphenous vein harvest from right thigh and lower leg  . Severe mitral regurgitation    Functional, improved with improvement in his EF.  . Tricuspid regurgitation     Past Surgical History:  Procedure Laterality Date  . CARDIAC CATHETERIZATION N/A 06/19/2014   Procedure: RIGHT/LEFT HEART CATH AND CORONARY/GRAFT ANGIOGRAPHY;  Surgeon: Thomas A Kelly, MD;  Location: [facility] CATH [facility];  Service: Cardiovascular;  Laterality: N/A;  . CARDIAC CATHETERIZATION  2003  . CARDIAC CATHETERIZATION N/A 08/28/2015   Procedure: Right Heart Cath and Coronary/Graft Angiography;  Surgeon: Dalton S McLean, MD;  Location: [facility] INVASIVE CV LAB;  Service: Cardiovascular;  Laterality: N/A;  . CARDIOVERSION N/A 06/01/2014   Procedure: CARDIOVERSION;  Surgeon: Katarina H Nelson, MD;  Location: [facility] ENDOSCOPY;  Service: Cardiovascular;  Laterality: N/A;  . CARDIOVERSION  06/11/2015; 08/02/2015  . CARDIOVERSION N/A 08/02/2015   Procedure: CARDIOVERSION;  Surgeon: Peter C Nishan, MD;  Location: [facility] ENDOS[facility];  Service: Cardiovascular;  Laterality: N/A;  . CARDIOVERSION N/A 10/22/2015   Procedure: CARDIOVERSION;  Surgeon: Dalton S McLean, MD;  Location: [facility] ENDOS[facility];  Service: Cardiovascular;  Laterality: N/A;  . CARDIOVERSION N/A 03/15/2020   Procedure: CARDIOVERSION;  Surgeon: McLean, Dalton S, MD;  Location: [facility] ENDOS[facility];  Service: Cardiovascular;    Laterality: N/A;  . CORONARY ARTERY BYPASS GRAFT  02/16/2002   CABG X5  . LAPAROSCOPIC APPENDECTOMY N/A 07/20/2016   Procedure: APPENDECTOMY LAPAROSCOPIC;  Surgeon: Matthew Tsuei, MD;  Location: [facility] OR;  Service: General;  Laterality: N/A;  . TEE WITHOUT CARDIOVERSION N/A 06/01/2014   Procedure: TRANSESOPHAGEAL ECHOCARDIOGRAM (TEE);  Surgeon: Katarina H Nelson, MD;  Location: [facility];  Service: Cardiovascular;  Laterality: N/A;  . TEE WITHOUT CARDIOVERSION N/A  08/03/2015   Procedure: TRANSESOPHAGEAL ECHOCARDIOGRAM (TEE);  Surgeon: Katarina H Nelson, MD;  Location: [facility];  Service: Cardiovascular;  Laterality: N/A;    Current Medications: Current Meds  Medication Sig  . apixaban (ELIQUIS) 5 MG TABS tablet Take 1 tablet (5 mg total) by mouth 2 (two) times daily.  . ENTRESTO 97-103 MG TAKE 1 TABLET BY MOUTH TWICE DAILY  . isosorbide mononitrate (IMDUR) 30 MG 24 hr tablet Take 1 tablet (30 mg total) by mouth daily.  . metoprolol succinate (TOPROL-XL) 50 MG 24 hr tablet Take 1 tablet (50 mg total) by mouth 2 (two) times daily.  . rosuvastatin (CRESTOR) 40 MG tablet Take 1 tablet (40 mg total) by mouth daily.     Allergies:   Patient has no known allergies.   Social History   Socioeconomic History  . Marital status: Married    Spouse name: Not on file  . Number of children: Not on file  . Years of education: Not on file  . Highest education level: Not on file  Occupational History  . Occupation: Retired  Tobacco Use  . Smoking status: Former Smoker    Packs/day: 1.00    Years: 40.00    Pack years: 40.00    Types: Cigarettes    Quit date: 02/02/2002    Years since quitting: 18.1  . Smokeless tobacco: Never Used  Substance and Sexual Activity  . Alcohol use: Yes    Comment: 08/02/2015 "I rarely have a beer"  . Drug use: No  . Sexual activity: Yes  Other Topics Concern  . Not on file  Social History Narrative   Lives in [place] with wife.   Social Determinants of Health   Financial Resource Strain:   . Difficulty of Paying Living Expenses: Not on file  Food Insecurity:   . Worried About Running Out of Food in the Last Year: Not on file  . Ran Out of Food in the Last Year: Not on file  Transportation Needs:   . Lack of Transportation (Medical): Not on file  . Lack of Transportation (Non-Medical): Not on file  Physical Activity:   . Days of Exercise per Week: Not on file  . Minutes of Exercise per Session: Not on file    Stress:   . Feeling of Stress : Not on file  Social Connections:   . Frequency of Communication with Friends and Family: Not on file  . Frequency of Social Gatherings with Friends and Family: Not on file  . Attends Religious Services: Not on file  . Active Member of Clubs or Organizations: Not on file  . Attends Club or Organization Meetings: Not on file  . Marital Status: Not on file     Family History: The patient's family history includes Cancer in his father and mother. There is no history of Hypertension or Stroke.  ROS:   Please see the history of present illness.     All other systems reviewed and are negative.  EKGs/Labs/Other Studies Reviewed:    The following studies were reviewed today:   Echo ,ECG  03/12/2020 Echo 1. Left ventricular ejection fraction, by estimation, is 45 to 50%. The  left ventricle has mildly decreased function. The left ventricle  demonstrates global hypokinesis. There is mild left ventricular  hypertrophy. Left ventricular diastolic parameters  are indeterminate.  2. Right ventricular systolic function is normal. The right ventricular  size is normal. Tricuspid regurgitation signal is inadequate for assessing  PA pressure.  3. Left atrial size was mildly dilated.  4. The mitral valve is normal in structure. No evidence of mitral valve  regurgitation. No evidence of mitral stenosis.  5. The aortic valve is tricuspid. Aortic valve regurgitation is not  visualized. Mild aortic valve sclerosis is present, with no evidence of  aortic valve stenosis.  6. The inferior vena cava is normal in size with greater than 50%  respiratory variability, suggesting right atrial pressure of 3 mmHg.  7. The patient is in atrial fibrillation.           EKG:  EKG is ordered today.  The ekg ordered today demonstrates atrial fibrillation.  Recent Labs: 03/12/2020: BUN 34; Creatinine, Ser 1.41; Hemoglobin 15.0; Platelets 222; Potassium 4.4; Sodium 139   Recent Lipid Panel    Component Value Date/Time   CHOL 114 09/15/2019 1003   TRIG 178 (H) 09/15/2019 1003   HDL 40 (L) 09/15/2019 1003   CHOLHDL 2.9 09/15/2019 1003   VLDL 36 09/15/2019 1003   LDLCALC 38 09/15/2019 1003    Physical Exam:    VS:  BP 124/82   Pulse 91   Ht 5' 11" (1.803 m)   Wt 229 lb (103.9 kg)   SpO2 95%   BMI 31.94 kg/m     Wt Readings from Last 3 Encounters:  03/28/20 229 lb (103.9 kg)  03/15/20 217 lb (98.4 kg)  03/12/20 220 lb 6.4 oz (100 kg)     GEN:  Well nourished, well developed in no acute distress HEENT: Normal NECK: No JVD; No carotid bruits LYMPHATICS: No lymphadenopathy CARDIAC: irregularly irregular, no murmurs, rubs, gallops RESPIRATORY:  Clear to auscultation without rales, wheezing or rhonchi  ABDOMEN: Soft, non-tender, non-distended MUSCULOSKELETAL: Trace to 1+ pitting edema in bilateral lower extremities to the lower third of his calves.; No deformity  SKIN: Warm and dry NEUROLOGIC:  Alert and oriented x 3 PSYCHIATRIC:  Normal affect   ASSESSMENT:    1. Persistent atrial fibrillation (HCC)   2. Atrial flutter with rapid ventricular response (HCC)   3. Chronic combined systolic and diastolic CHF (congestive heart failure) (HCC)   4. S/P CABG x 5   5. Atrial fibrillation, unspecified type (HCC)    PLAN:    In order of problems listed above:  1. Persistent atrial fibrillation, atrial flutter The patient has persistent atrial fibrillation and flutter which are contributing to his decreased LV function and functional mitral vegetation.  Based on his history I do think he would be a reasonable candidate for atrial fibrillation ablation and a CTI ablation.  I discussed the procedure at length with him and his wife and both wish to proceed with scheduling.  I discussed the option of antiarrhythmic therapy with the patient as well but he would like to pursue ablation therapy as first-line.   Plan for the procedure will be to perform  pulmonary vein isolation, posterior wall ablation, CTI line ablation.  Risk, benefits, and alternatives to EP study and radiofrequency ablation for afib were also discussed in detail today. These risks include but are not limited to   stroke, bleeding, vascular damage, tamponade, perforation, damage to the esophagus, lungs, and other structures, pulmonary vein stenosis, worsening renal function, and death. The patient understands these risk and wishes to proceed.  We will therefore proceed with catheter ablation at the next available time.  Carto, ICE, anesthesia are requested for the procedure.  Will also obtain CT PV protocol prior to the procedure to exclude LAA thrombus and further evaluate atrial anatomy.    2. Chronic combined systolic and diastolic heart failure The patient's uncontrolled ventricular rates with atrial fibrillation are contributing to the patient's heart failure symptoms and decreased LV function and functional mitral vegetation.  He appears to be mildly fluid overloaded with pitting edema in his bilateral lower extremities.  I suspect this is due to him being back in atrial fibrillation.  I asked him to take his Lasix tomorrow morning and I encouraged him to continue with daily weights.   Medication Adjustments/Labs and Tests Ordered: Current medicines are reviewed at length with the patient today.  Concerns regarding medicines are outlined above.  Orders Placed This Encounter  Procedures  . CT CARDIAC MORPH/PULM VEIN W/CM&W/O CA SCORE   Meds ordered this encounter  Medications  . metoprolol tartrate (LOPRESSOR) 100 MG tablet    Sig: Take one tablet by mouth 2 hours prior to cardiac CT    Dispense:  1 tablet    Refill:  0    Patient Instructions  Medication Instructions:  Your physician recommends that you continue on your current medications as directed. Please refer to the Current Medication list given to you today.  Labwork: None  ordered.  Testing/Procedures: None ordered.  Follow-Up:  SEE INSTRUCTION LETTER  Any Other Special Instructions Will Be Listed Below (If Applicable).  If you need a refill on your cardiac medications before your next appointment, please call your pharmacy.    Cardiac Ablation Cardiac ablation is a procedure to disable (ablate) a small amount of heart tissue in very specific places. The heart has many electrical connections. Sometimes these connections are abnormal and can cause the heart to beat very fast or irregularly. Ablating some of the problem areas can improve the heart rhythm or return it to normal. Ablation may be done for people who:  Have Wolff-Parkinson-White syndrome.  Have fast heart rhythms (tachycardia).  Have taken medicines for an abnormal heart rhythm (arrhythmia) that were not effective or caused side effects.  Have a high-risk heartbeat that may be life-threatening. During the procedure, a small incision is made in the neck or the groin, and a long, thin, flexible tube (catheter) is inserted into the incision and moved to the heart. Small devices (electrodes) on the tip of the catheter will send out electrical currents. A type of X-ray (fluoroscopy) will be used to help guide the catheter and to provide images of the heart. Tell a health care provider about:  Any allergies you have.  All medicines you are taking, including vitamins, herbs, eye drops, creams, and over-the-counter medicines.  Any problems you or family members have had with anesthetic medicines.  Any blood disorders you have.  Any surgeries you have had.  Any medical conditions you have, such as kidney failure.  Whether you are pregnant or may be pregnant. What are the risks? Generally, this is a safe procedure. However, problems may occur, including:  Infection.  Bruising and bleeding at the catheter insertion site.  Bleeding into the chest, especially into the sac that surrounds the  heart. This is a serious complication.    Stroke or blood clots.  Damage to other structures or organs.  Allergic reaction to medicines or dyes.  Need for a permanent pacemaker if the normal electrical system is damaged. A pacemaker is a small computer that sends electrical signals to the heart and helps your heart beat normally.  The procedure not being fully effective. This may not be recognized until months later. Repeat ablation procedures are sometimes required. What happens before the procedure?  Follow instructions from your health care provider about eating or drinking restrictions.  Ask your health care provider about: ? Changing or stopping your regular medicines. This is especially important if you are taking diabetes medicines or blood thinners. ? Taking medicines such as aspirin and ibuprofen. These medicines can thin your blood. Do not take these medicines before your procedure if your health care provider instructs you not to.  Plan to have someone take you home from the hospital or clinic.  If you will be going home right after the procedure, plan to have someone with you for 24 hours. What happens during the procedure?  To lower your risk of infection: ? Your health care team will wash or sanitize their hands. ? Your skin will be washed with soap. ? Hair may be removed from the incision area.  An IV tube will be inserted into one of your veins.  You will be given a medicine to help you relax (sedative).  The skin on your neck or groin will be numbed.  An incision will be made in your neck or your groin.  A needle will be inserted through the incision and into a large vein in your neck or groin.  A catheter will be inserted into the needle and moved to your heart.  Dye may be injected through the catheter to help your surgeon see the area of the heart that needs treatment.  Electrical currents will be sent from the catheter to ablate heart tissue in desired  areas. There are three types of energy that may be used to ablate heart tissue: ? Heat (radiofrequency energy). ? Laser energy. ? Extreme cold (cryoablation).  When the necessary tissue has been ablated, the catheter will be removed.  Pressure will be held on the catheter insertion area to prevent excessive bleeding.  A bandage (dressing) will be placed over the catheter insertion area. The procedure may vary among health care providers and hospitals. What happens after the procedure?  Your blood pressure, heart rate, breathing rate, and blood oxygen level will be monitored until the medicines you were given have worn off.  Your catheter insertion area will be monitored for bleeding. You will need to lie still for a few hours to ensure that you do not bleed from the catheter insertion area.  Do not drive for 24 hours or as long as directed by your health care provider. Summary  Cardiac ablation is a procedure to disable (ablate) a small amount of heart tissue in very specific places. Ablating some of the problem areas can improve the heart rhythm or return it to normal.  During the procedure, electrical currents will be sent from the catheter to ablate heart tissue in desired areas. This information is not intended to replace advice given to you by your health care provider. Make sure you discuss any questions you have with your health care provider. Document Revised: 01/11/2018 Document Reviewed: 06/09/2016 Elsevier Patient Education  2020 [employer]      Signed, Patria Warzecha T Gabryella Murfin, MD  03/28/2020 5:07   PM    [facility]    Anticoagulation instructions: Pt to hold Eliquis for 1 dose(s) prior to procedure and we will plan to resume the day of the procedure unless otherwise instructed after surgery.  Medication instructions morning of: The patient should hold ALL medications the morning of the procedure   Discharge: Our plan will be to discharge the  patient same day after a period of observation     

## 2020-03-28 ENCOUNTER — Ambulatory Visit (INDEPENDENT_AMBULATORY_CARE_PROVIDER_SITE_OTHER): Payer: Medicare Other | Admitting: Cardiology

## 2020-03-28 ENCOUNTER — Encounter: Payer: Self-pay | Admitting: Cardiology

## 2020-03-28 ENCOUNTER — Other Ambulatory Visit: Payer: Self-pay

## 2020-03-28 VITALS — BP 124/82 | HR 91 | Ht 71.0 in | Wt 229.0 lb

## 2020-03-28 DIAGNOSIS — I5042 Chronic combined systolic (congestive) and diastolic (congestive) heart failure: Secondary | ICD-10-CM

## 2020-03-28 DIAGNOSIS — I4892 Unspecified atrial flutter: Secondary | ICD-10-CM | POA: Diagnosis not present

## 2020-03-28 DIAGNOSIS — Z951 Presence of aortocoronary bypass graft: Secondary | ICD-10-CM

## 2020-03-28 DIAGNOSIS — I4891 Unspecified atrial fibrillation: Secondary | ICD-10-CM

## 2020-03-28 DIAGNOSIS — I4819 Other persistent atrial fibrillation: Secondary | ICD-10-CM

## 2020-03-28 MED ORDER — METOPROLOL TARTRATE 100 MG PO TABS
ORAL_TABLET | ORAL | 0 refills | Status: DC
Start: 2020-03-28 — End: 2020-05-09

## 2020-03-28 NOTE — Patient Instructions (Addendum)
Medication Instructions:  Your physician recommends that you continue on your current medications as directed. Please refer to the Current Medication list given to you today.  Labwork: None ordered.  Testing/Procedures: None ordered.  Follow-Up:  SEE INSTRUCTION LETTER  Any Other Special Instructions Will Be Listed Below (If Applicable).  If you need a refill on your cardiac medications before your next appointment, please call your pharmacy.    Cardiac Ablation Cardiac ablation is a procedure to disable (ablate) a small amount of heart tissue in very specific places. The heart has many electrical connections. Sometimes these connections are abnormal and can cause the heart to beat very fast or irregularly. Ablating some of the problem areas can improve the heart rhythm or return it to normal. Ablation may be done for people who:  Have Wolff-Parkinson-White syndrome.  Have fast heart rhythms (tachycardia).  Have taken medicines for an abnormal heart rhythm (arrhythmia) that were not effective or caused side effects.  Have a high-risk heartbeat that may be life-threatening. During the procedure, a small incision is made in the neck or the groin, and a long, thin, flexible tube (catheter) is inserted into the incision and moved to the heart. Small devices (electrodes) on the tip of the catheter will send out electrical currents. A type of X-ray (fluoroscopy) will be used to help guide the catheter and to provide images of the heart. Tell a health care provider about:  Any allergies you have.  All medicines you are taking, including vitamins, herbs, eye drops, creams, and over-the-counter medicines.  Any problems you or family members have had with anesthetic medicines.  Any blood disorders you have.  Any surgeries you have had.  Any medical conditions you have, such as kidney failure.  Whether you are pregnant or may be pregnant. What are the risks? Generally, this is a  safe procedure. However, problems may occur, including:  Infection.  Bruising and bleeding at the catheter insertion site.  Bleeding into the chest, especially into the sac that surrounds the heart. This is a serious complication.  Stroke or blood clots.  Damage to other structures or organs.  Allergic reaction to medicines or dyes.  Need for a permanent pacemaker if the normal electrical system is damaged. A pacemaker is a small computer that sends electrical signals to the heart and helps your heart beat normally.  The procedure not being fully effective. This may not be recognized until months later. Repeat ablation procedures are sometimes required. What happens before the procedure?  Follow instructions from your health care provider about eating or drinking restrictions.  Ask your health care provider about: ? Changing or stopping your regular medicines. This is especially important if you are taking diabetes medicines or blood thinners. ? Taking medicines such as aspirin and ibuprofen. These medicines can thin your blood. Do not take these medicines before your procedure if your health care provider instructs you not to.  Plan to have someone take you home from the hospital or clinic.  If you will be going home right after the procedure, plan to have someone with you for 24 hours. What happens during the procedure?  To lower your risk of infection: ? Your health care team will wash or sanitize their hands. ? Your skin will be washed with soap. ? Hair may be removed from the incision area.  An IV tube will be inserted into one of your veins.  You will be given a medicine to help you relax (sedative).  The   skin on your neck or groin will be numbed.  An incision will be made in your neck or your groin.  A needle will be inserted through the incision and into a large vein in your neck or groin.  A catheter will be inserted into the needle and moved to your  heart.  Dye may be injected through the catheter to help your surgeon see the area of the heart that needs treatment.  Electrical currents will be sent from the catheter to ablate heart tissue in desired areas. There are three types of energy that may be used to ablate heart tissue: ? Heat (radiofrequency energy). ? Laser energy. ? Extreme cold (cryoablation).  When the necessary tissue has been ablated, the catheter will be removed.  Pressure will be held on the catheter insertion area to prevent excessive bleeding.  A bandage (dressing) will be placed over the catheter insertion area. The procedure may vary among health care providers and hospitals. What happens after the procedure?  Your blood pressure, heart rate, breathing rate, and blood oxygen level will be monitored until the medicines you were given have worn off.  Your catheter insertion area will be monitored for bleeding. You will need to lie still for a few hours to ensure that you do not bleed from the catheter insertion area.  Do not drive for 24 hours or as long as directed by your health care provider. Summary  Cardiac ablation is a procedure to disable (ablate) a small amount of heart tissue in very specific places. Ablating some of the problem areas can improve the heart rhythm or return it to normal.  During the procedure, electrical currents will be sent from the catheter to ablate heart tissue in desired areas. This information is not intended to replace advice given to you by your health care provider. Make sure you discuss any questions you have with your health care provider. Document Revised: 01/11/2018 Document Reviewed: 06/09/2016 Elsevier Patient Education  2020 Elsevier Inc.  

## 2020-04-03 ENCOUNTER — Telehealth (HOSPITAL_COMMUNITY): Payer: Self-pay | Admitting: *Deleted

## 2020-04-03 NOTE — Telephone Encounter (Signed)

## 2020-04-03 NOTE — Addendum Note (Signed)
Addended by: Winifred Olive on: 04/03/2020 12:33 PM   Modules accepted: Orders

## 2020-04-04 ENCOUNTER — Ambulatory Visit (HOSPITAL_COMMUNITY)
Admission: RE | Admit: 2020-04-04 | Discharge: 2020-04-04 | Disposition: A | Discharge status: Home / Self Care | Payer: Medicare Other | Source: Ambulatory Visit | Attending: Cardiology | Admitting: Cardiology

## 2020-04-04 ENCOUNTER — Other Ambulatory Visit: Payer: Self-pay

## 2020-04-04 ENCOUNTER — Other Ambulatory Visit (HOSPITAL_COMMUNITY): Payer: Self-pay | Admitting: Cardiology

## 2020-04-04 DIAGNOSIS — I4891 Unspecified atrial fibrillation: Secondary | ICD-10-CM

## 2020-04-04 MED ORDER — IOHEXOL 350 MG/ML SOLN
80.0000 mL | Freq: Once | INTRAVENOUS | Status: AC | PRN
  Administered 2020-04-04: 80 mL via INTRAVENOUS

## 2020-04-10 ENCOUNTER — Other Ambulatory Visit (HOSPITAL_COMMUNITY)
Admission: RE | Admit: 2020-04-10 | Discharge: 2020-04-10 | Disposition: A | Discharge status: Home / Self Care | Payer: Medicare Other | Source: Ambulatory Visit | Attending: Cardiology | Admitting: Cardiology

## 2020-04-10 DIAGNOSIS — Z20822 Contact with and (suspected) exposure to covid-19: Secondary | ICD-10-CM | POA: Diagnosis not present

## 2020-04-10 DIAGNOSIS — Z01812 Encounter for preprocedural laboratory examination: Secondary | ICD-10-CM | POA: Insufficient documentation

## 2020-04-10 LAB — SARS CORONAVIRUS 2 (TAT 6-24 HRS): SARS Coronavirus 2: NEGATIVE

## 2020-04-10 NOTE — Progress Notes (Signed)
Instructed patient on the following items: Arrival time 0930 Nothing to eat or drink after midnight No meds AM of procedure Responsible person to drive you home and stay with you for 24 hrs  Have you missed any doses of anti-coagulant Eliquis, hasn't missed any doses, will take both doses today.

## 2020-04-11 ENCOUNTER — Ambulatory Visit (HOSPITAL_COMMUNITY)
Admission: RE | Admit: 2020-04-11 | Discharge: 2020-04-11 | Disposition: A | Discharge status: Home / Self Care | Payer: Medicare Other | Attending: Cardiology | Admitting: Cardiology

## 2020-04-11 ENCOUNTER — Ambulatory Visit (HOSPITAL_COMMUNITY): Payer: Medicare Other | Admitting: Anesthesiology

## 2020-04-11 ENCOUNTER — Encounter (HOSPITAL_COMMUNITY)
Admission: RE | Disposition: A | Discharge status: Home / Self Care | Payer: Self-pay | Source: Home / Self Care | Attending: Cardiology

## 2020-04-11 ENCOUNTER — Other Ambulatory Visit: Payer: Self-pay

## 2020-04-11 DIAGNOSIS — I5042 Chronic combined systolic (congestive) and diastolic (congestive) heart failure: Secondary | ICD-10-CM | POA: Insufficient documentation

## 2020-04-11 DIAGNOSIS — Z87891 Personal history of nicotine dependence: Secondary | ICD-10-CM | POA: Insufficient documentation

## 2020-04-11 DIAGNOSIS — I4819 Other persistent atrial fibrillation: Secondary | ICD-10-CM | POA: Insufficient documentation

## 2020-04-11 DIAGNOSIS — G4733 Obstructive sleep apnea (adult) (pediatric): Secondary | ICD-10-CM | POA: Insufficient documentation

## 2020-04-11 DIAGNOSIS — I110 Hypertensive heart disease with heart failure: Secondary | ICD-10-CM | POA: Diagnosis not present

## 2020-04-11 DIAGNOSIS — I483 Typical atrial flutter: Secondary | ICD-10-CM | POA: Diagnosis not present

## 2020-04-11 DIAGNOSIS — Z7901 Long term (current) use of anticoagulants: Secondary | ICD-10-CM | POA: Insufficient documentation

## 2020-04-11 DIAGNOSIS — E785 Hyperlipidemia, unspecified: Secondary | ICD-10-CM | POA: Diagnosis not present

## 2020-04-11 DIAGNOSIS — I2510 Atherosclerotic heart disease of native coronary artery without angina pectoris: Secondary | ICD-10-CM | POA: Diagnosis not present

## 2020-04-11 DIAGNOSIS — I255 Ischemic cardiomyopathy: Secondary | ICD-10-CM | POA: Insufficient documentation

## 2020-04-11 DIAGNOSIS — I340 Nonrheumatic mitral (valve) insufficiency: Secondary | ICD-10-CM | POA: Diagnosis not present

## 2020-04-11 DIAGNOSIS — Z951 Presence of aortocoronary bypass graft: Secondary | ICD-10-CM | POA: Insufficient documentation

## 2020-04-11 DIAGNOSIS — Z79899 Other long term (current) drug therapy: Secondary | ICD-10-CM | POA: Insufficient documentation

## 2020-04-11 DIAGNOSIS — I5043 Acute on chronic combined systolic (congestive) and diastolic (congestive) heart failure: Secondary | ICD-10-CM | POA: Diagnosis not present

## 2020-04-11 HISTORY — PX: ATRIAL FIBRILLATION ABLATION: EP1191

## 2020-04-11 LAB — POCT ACTIVATED CLOTTING TIME
Activated Clotting Time: 290 seconds
Activated Clotting Time: 257 seconds
Activated Clotting Time: 274 seconds

## 2020-04-11 SURGERY — ATRIAL FIBRILLATION ABLATION
Anesthesia: General

## 2020-04-11 MED ORDER — HEPARIN (PORCINE) IN NACL 1000-0.9 UT/500ML-% IV SOLN
INTRAVENOUS | Status: AC
  Filled 2020-04-11: qty 500

## 2020-04-11 MED ORDER — HEPARIN SODIUM (PORCINE) 1000 UNIT/ML IJ SOLN
INTRAMUSCULAR | Status: DC | PRN
  Administered 2020-04-11: 1000 [IU] via INTRAVENOUS

## 2020-04-11 MED ORDER — LIDOCAINE 2% (20 MG/ML) 5 ML SYRINGE
INTRAMUSCULAR | Status: DC | PRN
  Administered 2020-04-11: 20 mg via INTRAVENOUS

## 2020-04-11 MED ORDER — PROTAMINE SULFATE 10 MG/ML IV SOLN
INTRAVENOUS | Status: DC | PRN
  Administered 2020-04-11: 30 mg via INTRAVENOUS

## 2020-04-11 MED ORDER — ISOPROTERENOL HCL 0.2 MG/ML IJ SOLN
INTRAVENOUS | Status: DC | PRN
  Administered 2020-04-11: 5 ug/min via INTRAVENOUS

## 2020-04-11 MED ORDER — HEPARIN SODIUM (PORCINE) 1000 UNIT/ML IJ SOLN
INTRAMUSCULAR | Status: AC
  Filled 2020-04-11: qty 2

## 2020-04-11 MED ORDER — ONDANSETRON HCL 4 MG/2ML IJ SOLN
INTRAMUSCULAR | Status: DC | PRN
  Administered 2020-04-11: 4 mg via INTRAVENOUS

## 2020-04-11 MED ORDER — PROPOFOL 10 MG/ML IV BOLUS
INTRAVENOUS | Status: DC | PRN
  Administered 2020-04-11: 20 mg via INTRAVENOUS
  Administered 2020-04-11: 150 mg via INTRAVENOUS

## 2020-04-11 MED ORDER — SODIUM CHLORIDE 0.9% FLUSH: 3.0000 mL | INTRAVENOUS | Status: DC | PRN

## 2020-04-11 MED ORDER — SODIUM CHLORIDE 0.9 % IV SOLN: INTRAVENOUS | Status: DC

## 2020-04-11 MED ORDER — DEXAMETHASONE SODIUM PHOSPHATE 10 MG/ML IJ SOLN
INTRAMUSCULAR | Status: DC | PRN
  Administered 2020-04-11: 10 mg via INTRAVENOUS

## 2020-04-11 MED ORDER — SODIUM CHLORIDE 0.9% FLUSH: 3.0000 mL | Freq: Two times a day (BID) | INTRAVENOUS | Status: DC

## 2020-04-11 MED ORDER — FENTANYL CITRATE (PF) 100 MCG/2ML IJ SOLN
INTRAMUSCULAR | Status: DC | PRN
Start: 2020-04-11 — End: 2020-04-11
  Administered 2020-04-11 (×4): 25 ug via INTRAVENOUS

## 2020-04-11 MED ORDER — PHENYLEPHRINE HCL (PRESSORS) 10 MG/ML IV SOLN
INTRAVENOUS | Status: DC | PRN
  Administered 2020-04-11: 40 ug via INTRAVENOUS

## 2020-04-11 MED ORDER — ISOPROTERENOL HCL 0.2 MG/ML IJ SOLN
INTRAMUSCULAR | Status: AC
  Filled 2020-04-11: qty 5

## 2020-04-11 MED ORDER — HEPARIN (PORCINE) IN NACL 1000-0.9 UT/500ML-% IV SOLN
INTRAVENOUS | Status: DC | PRN
  Administered 2020-04-11 (×4): 500 mL

## 2020-04-11 MED ORDER — APIXABAN 5 MG PO TABS
5.0000 mg | ORAL_TABLET | Freq: Two times a day (BID) | ORAL | Status: DC
  Administered 2020-04-11: 5 mg via ORAL
  Filled 2020-04-11: qty 1

## 2020-04-11 MED ORDER — HEPARIN SODIUM (PORCINE) 1000 UNIT/ML IJ SOLN
INTRAMUSCULAR | Status: DC | PRN
  Administered 2020-04-11: 5000 [IU] via INTRAVENOUS
  Administered 2020-04-11: 3000 [IU] via INTRAVENOUS
  Administered 2020-04-11: 4000 [IU] via INTRAVENOUS
  Administered 2020-04-11: 17000 [IU] via INTRAVENOUS

## 2020-04-11 MED ORDER — ACETAMINOPHEN 325 MG PO TABS: 650.0000 mg | ORAL_TABLET | ORAL | Status: DC | PRN

## 2020-04-11 MED ORDER — SODIUM CHLORIDE 0.9 % IV SOLN: 250.0000 mL | INTRAVENOUS | Status: DC | PRN

## 2020-04-11 MED ORDER — ROCURONIUM BROMIDE 10 MG/ML (PF) SYRINGE
PREFILLED_SYRINGE | INTRAVENOUS | Status: DC | PRN
  Administered 2020-04-11: 50 mg via INTRAVENOUS
  Administered 2020-04-11 (×2): 20 mg via INTRAVENOUS

## 2020-04-11 MED ORDER — SUGAMMADEX SODIUM 200 MG/2ML IV SOLN
INTRAVENOUS | Status: DC | PRN
  Administered 2020-04-11: 200 mg via INTRAVENOUS

## 2020-04-11 MED ORDER — PHENYLEPHRINE HCL-NACL 10-0.9 MG/250ML-% IV SOLN
INTRAVENOUS | Status: DC | PRN
  Administered 2020-04-11: 25 ug/min via INTRAVENOUS

## 2020-04-11 MED ORDER — ONDANSETRON HCL 4 MG/2ML IJ SOLN: 4.0000 mg | Freq: Four times a day (QID) | INTRAMUSCULAR | Status: DC | PRN

## 2020-04-11 SURGICAL SUPPLY — 20 items
BLANKET WARM UNDERBOD FULL ACC (MISCELLANEOUS) ×3 IMPLANT
CATH 8FR REPROCESSED SOUNDSTAR (CATHETERS) ×3 IMPLANT
CATH MAPPNG PENTARAY F 2-6-2MM (CATHETERS) ×1 IMPLANT
CATH S CIRCA THERM PROBE 10F (CATHETERS) ×3 IMPLANT
CATH SMTCH THERMOCOOL SF DF (CATHETERS) ×3 IMPLANT
CATH WEBSTER BI DIR CS D-F CRV (CATHETERS) ×3 IMPLANT
COVER SWIFTLINK CONNECTOR (BAG) ×3 IMPLANT
DEVICE CLOSURE PERCLS PRGLD 6F (VASCULAR PRODUCTS) ×3 IMPLANT
PACK EP LATEX FREE (CUSTOM PROCEDURE TRAY) ×3
PACK EP LF (CUSTOM PROCEDURE TRAY) ×1 IMPLANT
PAD PRO RADIOLUCENT 2001M-C (PAD) ×3 IMPLANT
PATCH CARTO3 (PAD) ×3 IMPLANT
PENTARAY F 2-6-2MM (CATHETERS) ×3
PERCLOSE PROGLIDE 6F (VASCULAR PRODUCTS) ×9
SHEATH BAYLIS TRANSSEPTAL 98CM (NEEDLE) ×3 IMPLANT
SHEATH CARTO VIZIGO SM CVD (SHEATH) ×3 IMPLANT
SHEATH PINNACLE 8F 10CM (SHEATH) ×9 IMPLANT
SHEATH PINNACLE 9F 10CM (SHEATH) ×3 IMPLANT
SHEATH PROBE COVER 6X72 (BAG) ×3 IMPLANT
TUBING SMART ABLATE COOLFLOW (TUBING) ×6 IMPLANT

## 2020-04-11 NOTE — Transfer of Care (Signed)
Immediate Anesthesia Transfer of Care Note  Patient: Stanley Wells  Procedure(s) Performed: ATRIAL FIBRILLATION ABLATION (N/A )  Patient Location: PACU  Anesthesia Type:General  Level of Consciousness: drowsy and patient cooperative  Airway & Oxygen Therapy: Patient Spontanous Breathing  Post-op Assessment: Report given to RN, Post -op Vital signs reviewed and stable and Patient moving all extremities X 4  Post vital signs: Reviewed and stable  Last Vitals:  Vitals Value Taken Time  BP 121/83 04/11/20 1516  Temp    Pulse 63 04/11/20 1517  Resp 14 04/11/20 1517  SpO2 97 % 04/11/20 1517  Vitals shown include unvalidated device data.  Last Pain:  Vitals:   04/11/20 0948  TempSrc:   PainSc: 0-No pain      Patients Stated Pain Goal: 3 (04/11/20 0948)  Complications: No complications documented.

## 2020-04-11 NOTE — Anesthesia Procedure Notes (Signed)
Procedure Name: Intubation Date/Time: 04/11/2020 12:15 PM Performed by: Lavell Luster, CRNA Pre-anesthesia Checklist: Patient identified, Emergency Drugs available, Suction available, Patient being monitored and Timeout performed Patient Re-evaluated:Patient Re-evaluated prior to induction Oxygen Delivery Method: Circle system utilized Preoxygenation: Pre-oxygenation with 100% oxygen Induction Type: IV induction Ventilation: Mask ventilation without difficulty Laryngoscope Size: Mac and 4 Grade View: Grade I Tube type: Oral Number of attempts: 1 Airway Equipment and Method: Stylet Placement Confirmation: ETT inserted through vocal cords under direct vision,  positive ETCO2 and breath sounds checked- equal and bilateral Secured at: 21 cm Tube secured with: Tape Dental Injury: Teeth and Oropharynx as per pre-operative assessment

## 2020-04-11 NOTE — Interval H&P Note (Signed)
History and Physical Interval Note:  04/11/2020 12:03 PM  Stanley Wells  has presented today for surgery, with the diagnosis of afib.  The various methods of treatment have been discussed with the patient and family. After consideration of risks, benefits and other options for treatment, the patient has consented to  Procedure(s): ATRIAL FIBRILLATION ABLATION (N/A) as a surgical intervention.  The patient's history has been reviewed, patient examined, no change in status, stable for surgery.  I have reviewed the patient's chart and labs.  Questions were answered to the patient's satisfaction.     Reise Gladney T Aamari Strawderman

## 2020-04-11 NOTE — Anesthesia Preprocedure Evaluation (Addendum)
Anesthesia Evaluation  Patient identified by MRN, date of birth, ID band Patient awake    Reviewed: Allergy & Precautions, NPO status , Patient's Chart, lab work & pertinent test results, reviewed documented beta blocker date and time   Airway Mallampati: III  TM Distance: >3 FB Neck ROM: Full    Dental no notable dental hx. (+) Teeth Intact, Dental Advisory Given   Pulmonary sleep apnea (does not use CPAP) , former smoker,  40 pack year history, quit 2003 No inhalers Last sleep study a few years ago, cannot tolerate CPAP   Pulmonary exam normal breath sounds clear to auscultation       Cardiovascular hypertension, Pt. on medications and Pt. on home beta blockers + CAD, + CABG (2003 CABG x 5) and +CHF (LVEF 45-50%)  Normal cardiovascular exam+ dysrhythmias (s/p multiple cardioversions, on metoprolol/eliquis) Atrial Fibrillation  Rhythm:Regular Rate:Normal  Last echo 03/2020: 1. Left ventricular ejection fraction, by estimation, is 45 to 50%. The  left ventricle has mildly decreased function. The left ventricle  demonstrates global hypokinesis. There is mild left ventricular  hypertrophy. Left ventricular diastolic parameters  are indeterminate.  2. Right ventricular systolic function is normal. The right ventricular  size is normal. Tricuspid regurgitation signal is inadequate for assessing  PA pressure.  3. Left atrial size was mildly dilated.  4. The mitral valve is normal in structure. No evidence of mitral valve  regurgitation. No evidence of mitral stenosis.  5. The aortic valve is tricuspid. Aortic valve regurgitation is not  visualized. Mild aortic valve sclerosis is present, with no evidence of  aortic valve stenosis.  6. The inferior vena cava is normal in size with greater than 50%  respiratory variability, suggesting right atrial pressure of 3 mmHg.  7. The patient is in atrial fibrillation.   LD blood  thinner and rate controllers yesterday    Neuro/Psych negative neurological ROS  negative psych ROS   GI/Hepatic negative GI ROS, Neg liver ROS,   Endo/Other  Obesity BMI 32  Renal/GU Renal disease  negative genitourinary   Musculoskeletal negative musculoskeletal ROS (+)   Abdominal (+) + obese,   Peds  Hematology negative hematology ROS (+)   Anesthesia Other Findings   Reproductive/Obstetrics negative OB ROS                            Anesthesia Physical Anesthesia Plan  ASA: III  Anesthesia Plan: General   Post-op Pain Management:    Induction: Intravenous  PONV Risk Score and Plan: 2 and Ondansetron, Dexamethasone and Treatment may vary due to age or medical condition  Airway Management Planned: Oral ETT  Additional Equipment: None  Intra-op Plan:   Post-operative Plan: Extubation in OR  Informed Consent: I have reviewed the patients History and Physical, chart, labs and discussed the procedure including the risks, benefits and alternatives for the proposed anesthesia with the patient or authorized representative who has indicated his/her understanding and acceptance.     Dental advisory given  Plan Discussed with: CRNA  Anesthesia Plan Comments:        Anesthesia Quick Evaluation

## 2020-04-11 NOTE — Discharge Instructions (Signed)
Cardiac Ablation, Care After This sheet gives you information about how to care for yourself after your procedure. Your health care provider may also give you more specific instructions. If you have problems or questions, contact your health care provider. What can I expect after the procedure? After the procedure, it is common to have:  Bruising around your puncture site.  Tenderness around your puncture site.  Skipped heartbeats.  Tiredness (fatigue). Follow these instructions at home: Puncture site care   Follow instructions from your health care provider about how to take care of your puncture site. Make sure you: ? Wash your hands with soap and water before you change your bandage (dressing). If soap and water are not available, use hand sanitizer. ? Change your dressing as told by your health care provider. ? Leave stitches (sutures), skin glue, or adhesive strips in place. These skin closures may need to stay in place for up to 2 weeks. If adhesive strip edges start to loosen and curl up, you may trim the loose edges. Do not remove adhesive strips completely unless your health care provider tells you to do that.  Check your puncture site every day for signs of infection. Check for: ? Redness, swelling, or pain. ? Fluid or blood. If your puncture site starts to bleed, lie down on your back, apply firm pressure to the area, and contact your health care provider. ? Warmth. ? Pus or a bad smell. Driving  Ask your health care provider when it is safe for you to drive again after the procedure.  Do not drive or use heavy machinery while taking prescription pain medicine.  Do not drive for 24 hours if you were given a medicine to help you relax (sedative) during your procedure. Activity  Avoid activities that take a lot of effort for at least 3 days after your procedure.  Do not lift anything that is heavier than 10 lb (4.5 kg), or the limit that you are told, until your health  care provider says that it is safe.  Return to your normal activities as told by your health care provider. Ask your health care provider what activities are safe for you. General instructions  Take over-the-counter and prescription medicines only as told by your health care provider.  Do not use any products that contain nicotine or tobacco, such as cigarettes and e-cigarettes. If you need help quitting, ask your health care provider.  Do not take baths, swim, or use a hot tub until your health care provider approves.  Do not drink alcohol for 24 hours after your procedure.  Keep all follow-up visits as told by your health care provider. This is important. Contact a health care provider if:  You have redness, mild swelling, or pain around your puncture site.  You have fluid or blood coming from your puncture site that stops after applying firm pressure to the area.  Your puncture site feels warm to the touch.  You have pus or a bad smell coming from your puncture site.  You have a fever.  You have chest pain or discomfort that spreads to your neck, jaw, or arm.  You are sweating a lot.  You feel nauseous.  You have a fast or irregular heartbeat.  You have shortness of breath.  You are dizzy or light-headed and feel the need to lie down.  You have pain or numbness in the arm or leg closest to your puncture site. Get help right away if:  Your puncture   site suddenly swells.  Your puncture site is bleeding and the bleeding does not stop after applying firm pressure to the area. These symptoms may represent a serious problem that is an emergency. Do not wait to see if the symptoms will go away. Get medical help right away. Call your local emergency services (911 in the [place] Do not drive yourself to the hospital. Summary  After the procedure, it is normal to have bruising and tenderness at the puncture site in your groin, neck, or forearm.  Check your puncture site every  day for signs of infection.  Get help right away if your puncture site is bleeding and the bleeding does not stop after applying firm pressure to the area. This is a medical emergency. This information is not intended to replace advice given to you by your health care provider. Make sure you discuss any questions you have with your health care provider. Document Revised: 07/03/2017 Document Reviewed: 10/30/2016 Elsevier Patient Education  2020 [employer]  

## 2020-04-11 NOTE — Anesthesia Postprocedure Evaluation (Signed)
Anesthesia Post Note  Patient: Stanley Wells  Procedure(s) Performed: ATRIAL FIBRILLATION ABLATION (N/A )     Patient location during evaluation: PACU Anesthesia Type: General Level of consciousness: awake and alert, oriented and patient cooperative Pain management: pain level controlled Vital Signs Assessment: post-procedure vital signs reviewed and stable Respiratory status: spontaneous breathing, nonlabored ventilation and respiratory function stable Cardiovascular status: blood pressure returned to baseline and stable Postop Assessment: no apparent nausea or vomiting Anesthetic complications: no   No complications documented.  Last Vitals:  Vitals:   04/11/20 1518 04/11/20 1550  BP: 121/83 139/86  Pulse: 64 71  Resp: 12 16  Temp: (!) 36.3 C (!) 36.1 C  SpO2:      Last Pain:  Vitals:   04/11/20 1550  TempSrc: Temporal  PainSc:                  Lannie Fields

## 2020-04-12 ENCOUNTER — Encounter (HOSPITAL_COMMUNITY): Payer: Self-pay | Admitting: Cardiology

## 2020-04-17 ENCOUNTER — Encounter (HOSPITAL_COMMUNITY): Payer: Self-pay

## 2020-04-17 ENCOUNTER — Other Ambulatory Visit: Payer: Self-pay

## 2020-04-17 ENCOUNTER — Ambulatory Visit (HOSPITAL_COMMUNITY)
Admission: RE | Admit: 2020-04-17 | Discharge: 2020-04-17 | Disposition: A | Discharge status: Home / Self Care | Payer: Medicare Other | Source: Ambulatory Visit | Attending: Cardiology | Admitting: Cardiology

## 2020-04-17 VITALS — BP 140/80 | HR 63 | Ht 71.0 in | Wt 227.4 lb

## 2020-04-17 DIAGNOSIS — G4733 Obstructive sleep apnea (adult) (pediatric): Secondary | ICD-10-CM | POA: Diagnosis not present

## 2020-04-17 DIAGNOSIS — Z7901 Long term (current) use of anticoagulants: Secondary | ICD-10-CM | POA: Diagnosis not present

## 2020-04-17 DIAGNOSIS — I5042 Chronic combined systolic (congestive) and diastolic (congestive) heart failure: Secondary | ICD-10-CM

## 2020-04-17 DIAGNOSIS — Z8679 Personal history of other diseases of the circulatory system: Secondary | ICD-10-CM

## 2020-04-17 DIAGNOSIS — I130 Hypertensive heart and chronic kidney disease with heart failure and stage 1 through stage 4 chronic kidney disease, or unspecified chronic kidney disease: Secondary | ICD-10-CM | POA: Insufficient documentation

## 2020-04-17 DIAGNOSIS — I480 Paroxysmal atrial fibrillation: Secondary | ICD-10-CM | POA: Diagnosis not present

## 2020-04-17 DIAGNOSIS — I5022 Chronic systolic (congestive) heart failure: Secondary | ICD-10-CM | POA: Insufficient documentation

## 2020-04-17 DIAGNOSIS — Z87891 Personal history of nicotine dependence: Secondary | ICD-10-CM | POA: Diagnosis not present

## 2020-04-17 DIAGNOSIS — Z79899 Other long term (current) drug therapy: Secondary | ICD-10-CM | POA: Diagnosis not present

## 2020-04-17 DIAGNOSIS — N183 Chronic kidney disease, stage 3 unspecified: Secondary | ICD-10-CM | POA: Diagnosis not present

## 2020-04-17 DIAGNOSIS — Z951 Presence of aortocoronary bypass graft: Secondary | ICD-10-CM | POA: Diagnosis not present

## 2020-04-17 DIAGNOSIS — I252 Old myocardial infarction: Secondary | ICD-10-CM | POA: Insufficient documentation

## 2020-04-17 DIAGNOSIS — I4892 Unspecified atrial flutter: Secondary | ICD-10-CM | POA: Insufficient documentation

## 2020-04-17 DIAGNOSIS — Z9889 Other specified postprocedural states: Secondary | ICD-10-CM | POA: Diagnosis not present

## 2020-04-17 DIAGNOSIS — I255 Ischemic cardiomyopathy: Secondary | ICD-10-CM | POA: Insufficient documentation

## 2020-04-17 DIAGNOSIS — I340 Nonrheumatic mitral (valve) insufficiency: Secondary | ICD-10-CM | POA: Insufficient documentation

## 2020-04-17 DIAGNOSIS — I2510 Atherosclerotic heart disease of native coronary artery without angina pectoris: Secondary | ICD-10-CM | POA: Insufficient documentation

## 2020-04-17 LAB — HEPATIC FUNCTION PANEL
ALT: 26 U/L (ref 0–44)
AST: 17 U/L (ref 15–41)
Albumin: 3.7 g/dL (ref 3.5–5.0)
Alkaline Phosphatase: 55 U/L (ref 38–126)
Bilirubin, Direct: 0.2 mg/dL (ref 0.0–0.2)
Indirect Bilirubin: 1.1 mg/dL — ABNORMAL HIGH (ref 0.3–0.9)
Total Bilirubin: 1.3 mg/dL — ABNORMAL HIGH (ref 0.3–1.2)
Total Protein: 6.4 g/dL — ABNORMAL LOW (ref 6.5–8.1)

## 2020-04-17 LAB — LIPID PANEL
Cholesterol: 111 mg/dL (ref 0–200)
HDL: 39 mg/dL — ABNORMAL LOW (ref >40)
LDL Cholesterol: 42 mg/dL (ref 0–99)
Total CHOL/HDL Ratio: 2.8 RATIO
Triglycerides: 148 mg/dL (ref <150)
VLDL: 30 mg/dL (ref 0–40)

## 2020-04-17 LAB — BASIC METABOLIC PANEL
Anion gap: 9 (ref 5–15)
BUN: 20 mg/dL (ref 8–23)
CO2: 24 mmol/L (ref 22–32)
Calcium: 9.1 mg/dL (ref 8.9–10.3)
Chloride: 106 mmol/L (ref 98–111)
Creatinine, Ser: 1.11 mg/dL (ref 0.61–1.24)
GFR calc Af Amer: >60 mL/min (ref >60)
GFR calc non Af Amer: >60 mL/min (ref >60)
Glucose, Bld: 118 mg/dL — ABNORMAL HIGH (ref 70–99)
Potassium: 4 mmol/L (ref 3.5–5.1)
Sodium: 139 mmol/L (ref 135–145)

## 2020-04-17 LAB — BRAIN NATRIURETIC PEPTIDE: B Natriuretic Peptide: 174.9 pg/mL — ABNORMAL HIGH (ref 0.0–100.0)

## 2020-04-17 MED ORDER — FUROSEMIDE 40 MG PO TABS: 40.0000 mg | ORAL_TABLET | ORAL | 11 refills | Status: DC | PRN

## 2020-04-17 NOTE — Progress Notes (Signed)
ReDS Vest / Clip - 04/17/20 0900      ReDS Vest / Clip   Station Marker D    Ruler Value 36    ReDS Value Range Low volume    ReDS Actual Value 27    Anatomical Comments sitting

## 2020-04-17 NOTE — Progress Notes (Addendum)
Date:  04/17/2020   ID:  Stanley Wells, DOB 1946-03-25, MRN [id number]   Provider location: [facility] Type of Visit: Established patient   PCP:  [name], MD  Cardiologist: Dr. Shirlee Latch  Chief Complaint: F/u for Chronic Systolic Heart Failure and PAF    History of Present Illness: Stanley Wells is a 74 y.o. male who has a history of CAD, ischemic cardiomyopathy/chronic systolic CHF, severe functional mitral regurgitation, and persistent atrial fibrillation.  Patient had MI then CABG in 2003.  Cardiac cath in 2015 showed only LIMA-LAD and SVG-OM patent.  In 2015, EF was 30-35%.  He had atrial flutter in 2015 and again in 11/16, both times was cardioverted.  After the 11/16 cardioversion, he stopped all his meds.  He was admitted again in 12/16 with atrial fibrillation/RVR and a CHF exacerbation.  He was diuresed and started on Eliquis with plan for DCCV after 4 weeks of anticoagulation.  He was brought back at the end of December for cardioversion, but this was unsuccessful.  TEE in 12/16 showed EF 20-25% with severe functional MR.    He was seen by Dr [name] with plan for tentative plan for mitral valve repair versus replacement and possible tricuspid valve repair along with MAZE procedure.  He was referred to CHF clinic for optimization prior to surgery.   He went for left and right heart cath. RHC showed good left and right heart filling pressures but cardiac output was low.  Coronary angiography was stable with patent LIMA-LAD and SVG-OM.  Other SVGs occluded.    He had DCCV in 3/17 on amiodarone, this was later stopped.  He had a CPX in 3/17 with only mild functional impairment from CHF.    He had appendicitis with appendectomy in 12/17.   Evaluated in [facility] in 07/23/2018. He was in atrial flutter in the setting of a URI and underwent DC-CV.  He was put back on amiodarone. Amiodarone later stopped.   Echo in 7/20 showed EF 45-50% with septal  bounce on my review with mild LV dilation, normal RV size and systolic function, IVC normal.   Most recent echo 03/12/20 showed mildly reduced LVEF 45-50% with mild LVH, diffuse hypokinesis, normal RV, no MR.   He was seen by Dr. Shirlee Latch last month for routien f/u and noted to be back in atrial fibrillation but asymptomatic. He was set up for outpatient DCCV on 8/12. Converted back to NSR but then quickly reverted back to atrial fibrillation. He was referred to EP for atrial fibrillation ablation, performed by Dr. Lalla Brothers on 9/8.   He returns to clinic today for f/u. Here with his wife. EKG shows NSR w/ PACs, 74 bpm. He states he feels well. No symptoms of breakthrough afib. Denies dyspnea, although wt is up 7 lb from previous visit 1 month ago. ReDs clip however normal at 27%.  No LEE, orthopnea or PND. Denies CP. No abnormal bleeding w/ Eliquis.     ECG (personally reviewed): NSR w/ PACs, 74 bpm   Labs (9/16): LDL 126, HDL 38 Labs (12/16): K 4, creatinine 1.15 Labs (1/17): K 4.4, creatinine 1.09, LDL 42, HDL 39 Labs (10/01/2015): K 4.5 Creatinine 1.27 Dig level 0.4  Labs (4/17): digoxin 0.4, LFTs normal, TSH normal Labs (5/17): K 4.5, creatinine 1.36 Labs (6/17): K 5, creatinine 1.88, BNP 88, LDL 54, HDl 39, digoxin 0.8, LFTs normal, TSH normal Labs (9/17): K 4.8, creatinine 1.3, digoxin 0.6, TSH  normal, LFTs normal Labs (12/17): K 3.2, creatinine 1.33, digoxin 0.3 Labs (7/18): K 4.8, digoxin 0.2, creatinine 1.22 Labs (10/18): LDL 41, HDL 39, digoxin 0.3 Labs (6/213/19): K 4.8, creatinine 1.56, hgb 13.9 Labs (4/19): digoxin 0.5, K 4.6, creatinine 1.37 Labs (07/23/2018): K 4.6 Creatinine 1.49, hgb 13.8 Labs (5/20): K 4.5, creatinine 1.4, LDL 39, hgb 13.5 Labs (11/20): K 4.5, creatinine 1.05, hgb 13.4 Labs (2/21): K 4.1, creatinine 1.26, LDL 38, TGs 178 Labs (8/21): K 4.4, creatinine 1.41, hgb 15    PMH: 1. CAD: s/p MI in 2003.  CABG x 5 in 2003 with LIMA-LAD, SVG-D1, SVG-OM2, seq  SVG-PDA/PLV.  LHC/RHC 11/15 with native arteries essentially occluded.  SVG-D1 and seq SVG-PDA/PLV occluded, LIMA and SVG-OM patent.  LHC (1/17) with patent LIMA-LAD, occluded SVG-D, patent SVG-OM => SVG touches down on inferior branch of OM1 that backfills to superior branch of OM1, there are serial 70-80% stenoses in the proximal inferior OM1 branch that may compromise backflow to superior branch OM1, SVG-PDA/PLV totally occluded, EF 25% . 2. Atrial flutter: DCCV 2015.  Recurred 11/16 with DCCV.   3. Chronic systolic CHF: Ischemic cardiomyopathy.  Echo (2015) with moderate-severe MR, EF 30-35%.  TEE (12/16) with severe central MR (functional), EF 20-25% with diffuse hypokinesis, moderately decreased RV systolic function, moderate TR.  RHC (1/17) with mean RA 5, PA 27/12 mean 18, mean PCWP 10, CI 1.7 Fick/1.83 thermo. EF 25% on 1/17 LV-gram.  Cardiac MRI (2/17) with EF 19%, dilated LV, images not adequate to assess delayed enhancement.  - CPX (3/17) with peak VO2 20.4, VE/VCO2 slope 34, RER 1.11 => mild functional impairment.  - Echo (5/17) with EF 40-45%, trivial MR, mild RV dilation with low normal RV systolic function.  - Echo (5/18) with EF 40-45%, basal to mid inferior HK, moderate LVH, trivial MR.  - Echo (5/19) with EF 55-60%, no significant MR.  - Echo (7/20) with EF 45-50% on my review with mild LV dilation, normal RV size and systolic function, IVC normal, trivial MR.   - Echo (8/21) with EF 45-50% with mild LVH, diffuse hypokinesis, normal RV, no MR.  4. Mitral regurgitation: Severe on 12/16 TEE, but was only trivial on echo in 5/17 and 5/18 with aggressive medical treatment. 5/19 echo with no significant MR. 8/21 echo with no significant MR.  5. OSA: Not using CPAP.  6. HTN 7. Nephrolithiasis 8. Atrial fibrillation/flutter: Noted in 12/16 initially.  Paroxysmal.  Had DCCV to NSR in 3/17.  - Atrial flutter 12/19 => DCCV to NSR, briefly on amiodarone.  9. PFTs (1/17) with minimal  obstructive airways disease but severely decreased DLCO (37% predicted), possibly suggestive of pulmonary vascular disease.  10. ABIs (5/17) were normal.  11. CKD 12. Appendectomy 12/17. 13. Atrial Flutter Ablation 04/11/20 (Dr. Lalla BrothersLambert)    Current Outpatient Medications  Medication Sig Dispense Refill   acetaminophen (TYLENOL) 500 MG tablet Take 500 mg by mouth every 8 (eight) hours as needed for mild pain.     apixaban (ELIQUIS) 5 MG TABS tablet Take 1 tablet (5 mg total) by mouth 2 (two) times daily. 180 tablet 3   ENTRESTO 97-103 MG TAKE 1 TABLET BY MOUTH TWICE DAILY (Patient taking differently: Take 1 tablet by mouth 2 (two) times daily. ) 60 tablet 11   isosorbide mononitrate (IMDUR) 30 MG 24 hr tablet Take 1 tablet (30 mg total) by mouth daily. 90 tablet 3   metoprolol succinate (TOPROL-XL) 50 MG 24 hr tablet TAKE 1 TABLET(50  MG) BY MOUTH TWICE DAILY 60 tablet 11   rosuvastatin (CRESTOR) 40 MG tablet Take 1 tablet (40 mg total) by mouth daily. 90 tablet 3   metoprolol tartrate (LOPRESSOR) 100 MG tablet Take one tablet by mouth 2 hours prior to cardiac CT (Patient not taking: Reported on 04/17/2020) 1 tablet 0   No current facility-administered medications for this encounter.    Allergies:   Patient has no known allergies.   Social History:  The patient  reports that he quit smoking about 18 years ago. His smoking use included cigarettes. He has a 40.00 pack-year smoking history. He has never used smokeless tobacco. He reports current alcohol use. He reports that he does not use drugs.   Family History:  The patient's family history includes Cancer in his father and mother.   ROS:  Please see the history of present illness.   All other systems are personally reviewed and negative.   Vitals:   BP 140/80 (BP Location: Left Arm, Patient Position: Sitting, Cuff Size: Normal)    Pulse 63    Ht 5\' 11"  (1.803 m)    Wt 103.1 kg (227 lb 6.4 oz)    SpO2 95%    BMI 31.72 kg/m     PHYSICAL EXAM: ReDs Clip 27%  General:  Well appearing. No respiratory difficulty HEENT: normal Neck: supple. no JVD. Carotids 2+ bilat; no bruits. No lymphadenopathy or thyromegaly appreciated. Cor: PMI nondisplaced. Regular rate & rhythm. No rubs, gallops or murmurs. Lungs: clear Abdomen: soft, nontender, nondistended. No hepatosplenomegaly. No bruits or masses. Good bowel sounds. Extremities: no cyanosis, clubbing, rash, edema Neuro: alert & oriented x 3, cranial nerves grossly intact. moves all 4 extremities w/o difficulty. Affect pleasant.    Recent Labs: 03/12/2020: BUN 34; Creatinine, Ser 1.41; Hemoglobin 15.0; Platelets 222; Potassium 4.4; Sodium 139  Personally reviewed   Wt Readings from Last 3 Encounters:  04/17/20 103.1 kg (227 lb 6.4 oz)  04/11/20 99.8 kg (220 lb)  03/28/20 103.9 kg (229 lb)      ASSESSMENT AND PLAN:  1. Chronic systolic CHF: EF 03/30/20 on TEE from 12/16.  cMRI 2/17 with EF 19%, unable to assess for delayed enhancement on this study.  Ischemic cardiomyopathy.  Low cardiac output by RHC in 1/17 but filling pressures optimized.  Despite low output on that RHC, he has been doing quite well with medical treatment.  CPX (3/17) with only mild functional limitation. Echo 7/21 w/ EF 45-50%, normal RV size and systolic function, IVC normal, no significant MR. Euvolemic on exam today, though wt is higher. However ReDs clip also c/w exam and normal at 27%. NYHA Class II, stable.  - He has not needed Lasix.   - Continue Toprol XL 50 mg bid.   - Continue Entresto 97/103 bid.  Check BMP today.  - he is off spiro to due h/o hyperkalemia  2. CAD: s/p CABG.  On 1/17 cath, only LIMA-LAD and SVG-OM2 still patent.  - stable w/o anginal symptoms  - No ASA given use of apixaban. - Continue Crestor, good lipids in 2/21.  Will repeat FLP and HFTs again today  3. Atrial fibrillation/flutter: Paroxysmal.  Successful DCCV in 3/17 on amiodarone.  Amiodarone stopped, then he had  atrial flutter in 12/19 that was cardioverted back to NSR and amiodarone was restarted transiently. He had deterioration of his LV function in the past in the setting of atrial fibrillation. Back in Afib 8/21>>s/p successful DCCV followed by Atrial Fibrillation Ablation 04/11/20 by  Dr. Lalla Brothers. In NSR today w/ PACs. HR 70s.  - Continue apixaban. Check CBC  - Continue Toprol XL.  4. Mitral regurgitation: Severe by 12/16 TEE, probably functional.  Much improved on 5/17 echo, only trivial.  Trivial on 5/18 echo. No significant MR on echo 12/2017. Trivial on 7/20 echo. Minimal MR on echo 8/21.  5. CKD: Stage III. BMET today.    F/u w/ Dr. Shirlee Latch in 3 months.    Signed, Robbie Lis, PA-C  04/17/2020  [facility] [address] [facility] [place] [address] [phone] (office) [phone] (fax)

## 2020-04-17 NOTE — Patient Instructions (Signed)
  It was great to see you today! No medication changes are needed at this time.   Labs today We will only contact you if something comes back abnormal or we need to make some changes. Otherwise no news is good news!  Your physician recommends that you schedule a follow-up appointment in: 3 months with Dr McLean  If you have any questions or concerns before your next appointment please send us a message through mychart or call our office at [phone].    TO LEAVE A MESSAGE FOR THE NURSE SELECT OPTION 2, PLEASE LEAVE A MESSAGE INCLUDING: . YOUR NAME . DATE OF BIRTH . CALL BACK NUMBER . REASON FOR CALL**this is important as we prioritize the call backs  YOU WILL RECEIVE A CALL BACK THE SAME DAY AS LONG AS YOU CALL BEFORE 4:00 PM  

## 2020-04-18 NOTE — Addendum Note (Signed)
Encounter addended by: Marshell Rieger Q, CMA on: 04/18/2020 2:25 PM  Actions taken: Charge Capture section accepted

## 2020-05-09 ENCOUNTER — Other Ambulatory Visit: Payer: Self-pay

## 2020-05-09 ENCOUNTER — Other Ambulatory Visit (HOSPITAL_COMMUNITY): Payer: Self-pay | Admitting: *Deleted

## 2020-05-09 ENCOUNTER — Ambulatory Visit (HOSPITAL_COMMUNITY)
Admission: RE | Admit: 2020-05-09 | Discharge: 2020-05-09 | Disposition: A | Discharge status: Home / Self Care | Payer: Medicare Other | Source: Ambulatory Visit | Attending: Nurse Practitioner | Admitting: Nurse Practitioner

## 2020-05-09 ENCOUNTER — Encounter (HOSPITAL_COMMUNITY): Payer: Self-pay | Admitting: Nurse Practitioner

## 2020-05-09 VITALS — BP 150/80 | HR 88 | Ht 71.0 in | Wt 227.0 lb

## 2020-05-09 DIAGNOSIS — D6869 Other thrombophilia: Secondary | ICD-10-CM

## 2020-05-09 DIAGNOSIS — Z7901 Long term (current) use of anticoagulants: Secondary | ICD-10-CM | POA: Diagnosis not present

## 2020-05-09 DIAGNOSIS — I110 Hypertensive heart disease with heart failure: Secondary | ICD-10-CM | POA: Diagnosis not present

## 2020-05-09 DIAGNOSIS — Z87891 Personal history of nicotine dependence: Secondary | ICD-10-CM | POA: Diagnosis not present

## 2020-05-09 DIAGNOSIS — E785 Hyperlipidemia, unspecified: Secondary | ICD-10-CM | POA: Insufficient documentation

## 2020-05-09 DIAGNOSIS — I5022 Chronic systolic (congestive) heart failure: Secondary | ICD-10-CM | POA: Insufficient documentation

## 2020-05-09 DIAGNOSIS — I4819 Other persistent atrial fibrillation: Secondary | ICD-10-CM | POA: Diagnosis not present

## 2020-05-09 DIAGNOSIS — Z79899 Other long term (current) drug therapy: Secondary | ICD-10-CM | POA: Diagnosis not present

## 2020-05-09 DIAGNOSIS — Z951 Presence of aortocoronary bypass graft: Secondary | ICD-10-CM | POA: Diagnosis not present

## 2020-05-09 DIAGNOSIS — G4733 Obstructive sleep apnea (adult) (pediatric): Secondary | ICD-10-CM | POA: Insufficient documentation

## 2020-05-09 LAB — CBC
HCT: 48.6 % (ref 39.0–52.0)
Hemoglobin: 15.1 g/dL (ref 13.0–17.0)
MCH: 29.1 pg (ref 26.0–34.0)
MCHC: 31.1 g/dL (ref 30.0–36.0)
MCV: 93.6 fL (ref 80.0–100.0)
Platelets: 227 10*3/uL (ref 150–400)
RBC: 5.19 MIL/uL (ref 4.22–5.81)
RDW: 14.6 % (ref 11.5–15.5)
WBC: 9.9 10*3/uL (ref 4.0–10.5)
nRBC: 0 % (ref 0.0–0.2)

## 2020-05-09 LAB — BASIC METABOLIC PANEL
Anion gap: 9 (ref 5–15)
BUN: 22 mg/dL (ref 8–23)
CO2: 25 mmol/L (ref 22–32)
Calcium: 9.1 mg/dL (ref 8.9–10.3)
Chloride: 104 mmol/L (ref 98–111)
Creatinine, Ser: 1.14 mg/dL (ref 0.61–1.24)
GFR calc non Af Amer: >60 mL/min (ref >60)
Glucose, Bld: 121 mg/dL — ABNORMAL HIGH (ref 70–99)
Potassium: 5.2 mmol/L — ABNORMAL HIGH (ref 3.5–5.1)
Sodium: 138 mmol/L (ref 135–145)

## 2020-05-09 NOTE — H&P (View-Only) (Signed)
Primary Care Physician: [name], MD Referring Physician: Dr. Lalla Brothers AHF- Dr. [name] is a 74 y.o. male with a h/o CAD, HTN, LV dysfunction, prior severe MR by echo in 2016, since resolved ),  persistent afib that has underwent multiple  cardioversion's,  last  one  in August, with ERAF, and ablation with Dr. Lalla Brothers 04/11/20. He had a f/u in HF clinic 9/14 and was in SR with PAC's at that time. He is not aware when he is in afib. He does not track his heart rhythm at home. No swallowing or groin issues from the procedure. He has untreated sleep apnea, never was able to tolerate cpap. Minimal  alcohol, no tobacco, acceptable levels of caffeine.  He had been on amiodarone in the past in 2017 and transiently in 2019.   Today, he denies symptoms of palpitations, chest pain, shortness of breath, orthopnea, PND, lower extremity edema, dizziness, presyncope, syncope, or neurologic sequela. The patient is tolerating medications without difficulties and is otherwise without complaint today.   Past Medical History:  Diagnosis Date  . Atrial flutter with rapid ventricular response (HCC) 07/23/2018  . CAD (coronary artery disease)    a. s/p CABG 2003  b. s/p acute MI prior to CABG w/ EF <30%  . Cardiomyopathy, ischemic: EF 20-25%   . Cholecystitis   . Chronic combined systolic and diastolic CHF (congestive heart failure) (HCC)   . Chronic systolic CHF (congestive heart failure) (HCC)    a. 2D ECHO: 05/17/2014: EF 30-35%;  b. 07/2015 TEE: EF 20-25%, diff HK, sev MR, sev dil LA w/o thrombus, mod reduced RV fxn, mod dil RA, mod TR.  Marland Kitchen Hyperlipidemia   . Hypertensive heart disease   . Kidney stones X 1   "passed it" (08/02/2015)  . OSA (obstructive sleep apnea) 10/11/2014   refuses to use CPAP  . Persistent atrial fibrillation (HCC)    a. s/p succesful TEE/DCCV on 06/01/14.  b. on Eliquis;  c. 07/2015 recurrent AF-->Failed DCCV.  . S/P CABG x 5 02/16/2002   LIMA to LAD,  SVG to D1, SVG to OM2, Sequential SVG to PDA-RPL, saphenous vein harvest from right thigh and lower leg  . Severe mitral regurgitation    Functional, improved with improvement in his EF.  . Tricuspid regurgitation    Past Surgical History:  Procedure Laterality Date  . ATRIAL FIBRILLATION ABLATION N/A 04/11/2020   Procedure: ATRIAL FIBRILLATION ABLATION;  Surgeon: Lanier Prude, MD;  Location: [facility] INVASIVE CV LAB;  Service: Cardiovascular;  Laterality: N/A;  . CARDIAC CATHETERIZATION N/A 06/19/2014   Procedure: RIGHT/LEFT HEART CATH AND CORONARY/GRAFT ANGIOGRAPHY;  Surgeon: Lennette Bihari, MD;  Location: [facility] CATH LAB;  Service: Cardiovascular;  Laterality: N/A;  . CARDIAC CATHETERIZATION  2003  . CARDIAC CATHETERIZATION N/A 08/28/2015   Procedure: Right Heart Cath and Coronary/Graft Angiography;  Surgeon: Laurey Morale, MD;  Location: [facility] INVASIVE CV LAB;  Service: Cardiovascular;  Laterality: N/A;  . CARDIOVERSION N/A 06/01/2014   Procedure: CARDIOVERSION;  Surgeon: Lars Masson, MD;  Location: [facility] ENDOSCOPY;  Service: Cardiovascular;  Laterality: N/A;  . CARDIOVERSION  06/11/2015; 08/02/2015  . CARDIOVERSION N/A 08/02/2015   Procedure: CARDIOVERSION;  Surgeon: Wendall Stade, MD;  Location: [facility] ENDOSCOPY;  Service: Cardiovascular;  Laterality: N/A;  . CARDIOVERSION N/A 10/22/2015   Procedure: CARDIOVERSION;  Surgeon: Laurey Morale, MD;  Location: [facility] ENDOSCOPY;  Service: Cardiovascular;  Laterality: N/A;  . CARDIOVERSION N/A 03/15/2020  Procedure: CARDIOVERSION;  Surgeon: Laurey Morale, MD;  Location: [facility] ENDOSCOPY;  Service: Cardiovascular;  Laterality: N/A;  . CORONARY ARTERY BYPASS GRAFT  02/16/2002   CABG X5  . LAPAROSCOPIC APPENDECTOMY N/A 07/20/2016   Procedure: APPENDECTOMY LAPAROSCOPIC;  Surgeon: Manus Rudd, MD;  Location: [facility] OR;  Service: General;  Laterality: N/A;  . TEE WITHOUT CARDIOVERSION N/A 06/01/2014   Procedure: TRANSESOPHAGEAL ECHOCARDIOGRAM (TEE);  Surgeon:  Lars Masson, MD;  Location: [facility] ENDOSCOPY;  Service: Cardiovascular;  Laterality: N/A;  . TEE WITHOUT CARDIOVERSION N/A 08/03/2015   Procedure: TRANSESOPHAGEAL ECHOCARDIOGRAM (TEE);  Surgeon: Lars Masson, MD;  Location: [facility] ENDOSCOPY;  Service: Cardiovascular;  Laterality: N/A;    Current Outpatient Medications  Medication Sig Dispense Refill  . acetaminophen (TYLENOL) 500 MG tablet Take 500 mg by mouth every 8 (eight) hours as needed for mild pain.    Marland Kitchen apixaban (ELIQUIS) 5 MG TABS tablet Take 1 tablet (5 mg total) by mouth 2 (two) times daily. 180 tablet 3  . ENTRESTO 97-103 MG TAKE 1 TABLET BY MOUTH TWICE DAILY 60 tablet 11  . furosemide (LASIX) 40 MG tablet Take 1 tablet (40 mg total) by mouth as needed. 30 tablet 11  . isosorbide mononitrate (IMDUR) 30 MG 24 hr tablet Take 1 tablet (30 mg total) by mouth daily. 90 tablet 3  . metoprolol succinate (TOPROL-XL) 50 MG 24 hr tablet TAKE 1 TABLET(50 MG) BY MOUTH TWICE DAILY 60 tablet 11  . rosuvastatin (CRESTOR) 40 MG tablet Take 1 tablet (40 mg total) by mouth daily. 90 tablet 3   No current facility-administered medications for this encounter.    No Known Allergies  Social History   Socioeconomic History  . Marital status: Married    Spouse name: Not on file  . Number of children: Not on file  . Years of education: Not on file  . Highest education level: Not on file  Occupational History  . Occupation: Retired  Tobacco Use  . Smoking status: Former Smoker    Packs/day: 1.00    Years: 40.00    Pack years: 40.00    Types: Cigarettes    Quit date: 02/02/2002    Years since quitting: 18.2  . Smokeless tobacco: Never Used  Substance and Sexual Activity  . Alcohol use: Yes    Comment: 08/02/2015 "I rarely have a beer"  . Drug use: No  . Sexual activity: Yes  Other Topics Concern  . Not on file  Social History Narrative   Lives in [place] with wife.   Social Determinants of Health   Financial Resource Strain:     . Difficulty of Paying Living Expenses: Not on file  Food Insecurity:   . Worried About Programme researcher, broadcasting/film/video in the Last Year: Not on file  . Ran Out of Food in the Last Year: Not on file  Transportation Needs:   . Lack of Transportation (Medical): Not on file  . Lack of Transportation (Non-Medical): Not on file  Physical Activity:   . Days of Exercise per Week: Not on file  . Minutes of Exercise per Session: Not on file  Stress:   . Feeling of Stress : Not on file  Social Connections:   . Frequency of Communication with Friends and Family: Not on file  . Frequency of Social Gatherings with Friends and Family: Not on file  . Attends Religious Services: Not on file  . Active Member of Clubs or Organizations: Not on file  . Attends Club  or Organization Meetings: Not on file  . Marital Status: Not on file  Intimate Partner Violence:   . Fear of Current or Ex-Partner: Not on file  . Emotionally Abused: Not on file  . Physically Abused: Not on file  . Sexually Abused: Not on file    Family History  Problem Relation Age of Onset  . Cancer Mother   . Cancer Father   . Hypertension Neg Hx   . Stroke Neg Hx     ROS- All systems are reviewed and negative except as per the HPI above  Physical Exam: Vitals:   05/09/20 0839  BP: (!) 150/80  Pulse: 88  Weight: 103 kg  Height: 5\' 11"  (1.803 m)   Wt Readings from Last 3 Encounters:  05/09/20 103 kg  04/17/20 103.1 kg  04/11/20 99.8 kg    Labs: Lab Results  Component Value Date   NA 139 04/17/2020   K 4.0 04/17/2020   CL 106 04/17/2020   CO2 24 04/17/2020   GLUCOSE 118 (H) 04/17/2020   BUN 20 04/17/2020   CREATININE 1.11 04/17/2020   CALCIUM 9.1 04/17/2020   MG 2.2 08/04/2015   Lab Results  Component Value Date   INR 1.28 08/24/2015   Lab Results  Component Value Date   CHOL 111 04/17/2020   HDL 39 (L) 04/17/2020   LDLCALC 42 04/17/2020   TRIG 148 04/17/2020     GEN- The patient is well appearing, alert and  oriented x 3 today.   Head- normocephalic, atraumatic Eyes-  Sclera clear, conjunctiva pink Ears- hearing intact Oropharynx- clear Neck- supple, no JVP Lymph- no cervical lymphadenopathy Lungs- Clear to ausculation bilaterally, normal work of breathing Heart- irregular rate and rhythm, no murmurs, rubs or gallops, PMI not laterally displaced GI- soft, NT, ND, + BS Extremities- no clubbing, cyanosis, or edema MS- no significant deformity or atrophy Skin- no rash or lesion Psych- euthymic mood, full affect Neuro- strength and sensation are intact  EKG-afib with controlled v rates at 88 bpm Echo-   Assessment and Plan: 1. Persistent afib Pt is out of rhythm today, one month out from ablation He is unaware and  asymptomatic  He does not track his HR at home so I will assume this is persistent and set him up for cardioversion with Dr. 04/19/2020  I have given him a pulse ox monitor today( from heart and vascular pt fund) and showed him how to use it to see if he may be paroxysmal vrs  persistent  If he notices a very regular heart rate with stable heart rhythm  prior to cardioversion,  he is to call the office to confirm with an EKG and CV will be cancelled He will continue metoprolol succinate 50 mg daily  Cbc/bmet/covid  He has had J&J vaccine   2. CHA2DS2VASc score of 4 Continue  eliquis 5 mg bid  States no missed doses x at least 3 weeks   3, CHF Stable   4. HTN  Mildly elevated today Avoid salt   5.OSA Untreated States he will not use cpap  I will see back one week after cardioversion   Shirlee Latch C. Lupita Leash [facility] [address] [place], [place] [phone]

## 2020-05-09 NOTE — Patient Instructions (Signed)
Cardioversion scheduled for Monday. October 11th  - Arrive at the [facility] and go to admitting at 1130AM  - Do not eat or drink anything after midnight the night prior to your procedure.  - Take all your morning medication (except diabetic medications) with a sip of water prior to arrival.  - You will not be able to drive home after your procedure.  - Do NOT miss any doses of your blood thinner - if you should miss a dose please notify our office immediately.  - If you feel as if you go back into normal rhythm prior to scheduled cardioversion, please notify our office immediately. If your procedure is canceled in the cardioversion suite you will be charged a cancellation fee.

## 2020-05-09 NOTE — Progress Notes (Addendum)
Primary Care Physician: [name], MD Referring Physician: Dr. Lalla Brothers AHF- Dr. [name] is a 74 y.o. male with a h/o CAD, HTN, LV dysfunction, prior severe MR by echo in 2016, since resolved ),  persistent afib that has underwent multiple  cardioversion's,  last  one  in August, with ERAF, and ablation with Dr. Lalla Brothers 04/11/20. He had a f/u in HF clinic 9/14 and was in SR with PAC's at that time. He is not aware when he is in afib. He does not track his heart rhythm at home. No swallowing or groin issues from the procedure. He has untreated sleep apnea, never was able to tolerate cpap. Minimal  alcohol, no tobacco, acceptable levels of caffeine.  He had been on amiodarone in the past in 2017 and transiently in 2019.   Today, he denies symptoms of palpitations, chest pain, shortness of breath, orthopnea, PND, lower extremity edema, dizziness, presyncope, syncope, or neurologic sequela. The patient is tolerating medications without difficulties and is otherwise without complaint today.   Past Medical History:  Diagnosis Date  . Atrial flutter with rapid ventricular response (HCC) 07/23/2018  . CAD (coronary artery disease)    a. s/p CABG 2003  b. s/p acute MI prior to CABG w/ EF <30%  . Cardiomyopathy, ischemic: EF 20-25%   . Cholecystitis   . Chronic combined systolic and diastolic CHF (congestive heart failure) (HCC)   . Chronic systolic CHF (congestive heart failure) (HCC)    a. 2D ECHO: 05/17/2014: EF 30-35%;  b. 07/2015 TEE: EF 20-25%, diff HK, sev MR, sev dil LA w/o thrombus, mod reduced RV fxn, mod dil RA, mod TR.  Marland Kitchen Hyperlipidemia   . Hypertensive heart disease   . Kidney stones X 1   "passed it" (08/02/2015)  . OSA (obstructive sleep apnea) 10/11/2014   refuses to use CPAP  . Persistent atrial fibrillation (HCC)    a. s/p succesful TEE/DCCV on 06/01/14.  b. on Eliquis;  c. 07/2015 recurrent AF-->Failed DCCV.  . S/P CABG x 5 02/16/2002   LIMA to LAD,  SVG to D1, SVG to OM2, Sequential SVG to PDA-RPL, saphenous vein harvest from right thigh and lower leg  . Severe mitral regurgitation    Functional, improved with improvement in his EF.  . Tricuspid regurgitation    Past Surgical History:  Procedure Laterality Date  . ATRIAL FIBRILLATION ABLATION N/A 04/11/2020   Procedure: ATRIAL FIBRILLATION ABLATION;  Surgeon: Lanier Prude, MD;  Location: [facility] INVASIVE CV LAB;  Service: Cardiovascular;  Laterality: N/A;  . CARDIAC CATHETERIZATION N/A 06/19/2014   Procedure: RIGHT/LEFT HEART CATH AND CORONARY/GRAFT ANGIOGRAPHY;  Surgeon: Lennette Bihari, MD;  Location: [facility] CATH LAB;  Service: Cardiovascular;  Laterality: N/A;  . CARDIAC CATHETERIZATION  2003  . CARDIAC CATHETERIZATION N/A 08/28/2015   Procedure: Right Heart Cath and Coronary/Graft Angiography;  Surgeon: Laurey Morale, MD;  Location: [facility] INVASIVE CV LAB;  Service: Cardiovascular;  Laterality: N/A;  . CARDIOVERSION N/A 06/01/2014   Procedure: CARDIOVERSION;  Surgeon: Lars Masson, MD;  Location: [facility] ENDOSCOPY;  Service: Cardiovascular;  Laterality: N/A;  . CARDIOVERSION  06/11/2015; 08/02/2015  . CARDIOVERSION N/A 08/02/2015   Procedure: CARDIOVERSION;  Surgeon: Wendall Stade, MD;  Location: [facility] ENDOSCOPY;  Service: Cardiovascular;  Laterality: N/A;  . CARDIOVERSION N/A 10/22/2015   Procedure: CARDIOVERSION;  Surgeon: Laurey Morale, MD;  Location: [facility] ENDOSCOPY;  Service: Cardiovascular;  Laterality: N/A;  . CARDIOVERSION N/A 03/15/2020  Procedure: CARDIOVERSION;  Surgeon: Laurey Morale, MD;  Location: [facility] ENDOSCOPY;  Service: Cardiovascular;  Laterality: N/A;  . CORONARY ARTERY BYPASS GRAFT  02/16/2002   CABG X5  . LAPAROSCOPIC APPENDECTOMY N/A 07/20/2016   Procedure: APPENDECTOMY LAPAROSCOPIC;  Surgeon: Manus Rudd, MD;  Location: [facility] OR;  Service: General;  Laterality: N/A;  . TEE WITHOUT CARDIOVERSION N/A 06/01/2014   Procedure: TRANSESOPHAGEAL ECHOCARDIOGRAM (TEE);  Surgeon:  Lars Masson, MD;  Location: [facility] ENDOSCOPY;  Service: Cardiovascular;  Laterality: N/A;  . TEE WITHOUT CARDIOVERSION N/A 08/03/2015   Procedure: TRANSESOPHAGEAL ECHOCARDIOGRAM (TEE);  Surgeon: Lars Masson, MD;  Location: [facility] ENDOSCOPY;  Service: Cardiovascular;  Laterality: N/A;    Current Outpatient Medications  Medication Sig Dispense Refill  . acetaminophen (TYLENOL) 500 MG tablet Take 500 mg by mouth every 8 (eight) hours as needed for mild pain.    Marland Kitchen apixaban (ELIQUIS) 5 MG TABS tablet Take 1 tablet (5 mg total) by mouth 2 (two) times daily. 180 tablet 3  . ENTRESTO 97-103 MG TAKE 1 TABLET BY MOUTH TWICE DAILY 60 tablet 11  . furosemide (LASIX) 40 MG tablet Take 1 tablet (40 mg total) by mouth as needed. 30 tablet 11  . isosorbide mononitrate (IMDUR) 30 MG 24 hr tablet Take 1 tablet (30 mg total) by mouth daily. 90 tablet 3  . metoprolol succinate (TOPROL-XL) 50 MG 24 hr tablet TAKE 1 TABLET(50 MG) BY MOUTH TWICE DAILY 60 tablet 11  . rosuvastatin (CRESTOR) 40 MG tablet Take 1 tablet (40 mg total) by mouth daily. 90 tablet 3   No current facility-administered medications for this encounter.    No Known Allergies  Social History   Socioeconomic History  . Marital status: Married    Spouse name: Not on file  . Number of children: Not on file  . Years of education: Not on file  . Highest education level: Not on file  Occupational History  . Occupation: Retired  Tobacco Use  . Smoking status: Former Smoker    Packs/day: 1.00    Years: 40.00    Pack years: 40.00    Types: Cigarettes    Quit date: 02/02/2002    Years since quitting: 18.2  . Smokeless tobacco: Never Used  Substance and Sexual Activity  . Alcohol use: Yes    Comment: 08/02/2015 "I rarely have a beer"  . Drug use: No  . Sexual activity: Yes  Other Topics Concern  . Not on file  Social History Narrative   Lives in [place] with wife.   Social Determinants of Health   Financial Resource Strain:     . Difficulty of Paying Living Expenses: Not on file  Food Insecurity:   . Worried About Programme researcher, broadcasting/film/video in the Last Year: Not on file  . Ran Out of Food in the Last Year: Not on file  Transportation Needs:   . Lack of Transportation (Medical): Not on file  . Lack of Transportation (Non-Medical): Not on file  Physical Activity:   . Days of Exercise per Week: Not on file  . Minutes of Exercise per Session: Not on file  Stress:   . Feeling of Stress : Not on file  Social Connections:   . Frequency of Communication with Friends and Family: Not on file  . Frequency of Social Gatherings with Friends and Family: Not on file  . Attends Religious Services: Not on file  . Active Member of Clubs or Organizations: Not on file  . Attends Club  or Organization Meetings: Not on file  . Marital Status: Not on file  Intimate Partner Violence:   . Fear of Current or Ex-Partner: Not on file  . Emotionally Abused: Not on file  . Physically Abused: Not on file  . Sexually Abused: Not on file    Family History  Problem Relation Age of Onset  . Cancer Mother   . Cancer Father   . Hypertension Neg Hx   . Stroke Neg Hx     ROS- All systems are reviewed and negative except as per the HPI above  Physical Exam: Vitals:   05/09/20 0839  BP: (!) 150/80  Pulse: 88  Weight: 103 kg  Height: 5\' 11"  (1.803 m)   Wt Readings from Last 3 Encounters:  05/09/20 103 kg  04/17/20 103.1 kg  04/11/20 99.8 kg    Labs: Lab Results  Component Value Date   NA 139 04/17/2020   K 4.0 04/17/2020   CL 106 04/17/2020   CO2 24 04/17/2020   GLUCOSE 118 (H) 04/17/2020   BUN 20 04/17/2020   CREATININE 1.11 04/17/2020   CALCIUM 9.1 04/17/2020   MG 2.2 08/04/2015   Lab Results  Component Value Date   INR 1.28 08/24/2015   Lab Results  Component Value Date   CHOL 111 04/17/2020   HDL 39 (L) 04/17/2020   LDLCALC 42 04/17/2020   TRIG 148 04/17/2020     GEN- The patient is well appearing, alert and  oriented x 3 today.   Head- normocephalic, atraumatic Eyes-  Sclera clear, conjunctiva pink Ears- hearing intact Oropharynx- clear Neck- supple, no JVP Lymph- no cervical lymphadenopathy Lungs- Clear to ausculation bilaterally, normal work of breathing Heart- irregular rate and rhythm, no murmurs, rubs or gallops, PMI not laterally displaced GI- soft, NT, ND, + BS Extremities- no clubbing, cyanosis, or edema MS- no significant deformity or atrophy Skin- no rash or lesion Psych- euthymic mood, full affect Neuro- strength and sensation are intact  EKG-afib with controlled v rates at 88 bpm Echo-   Assessment and Plan: 1. Persistent afib Pt is out of rhythm today, one month out from ablation He is unaware and  asymptomatic  He does not track his HR at home so I will assume this is persistent and set him up for cardioversion with Dr. 04/19/2020  I have given him a pulse ox monitor today( from heart and vascular pt fund) and showed him how to use it to see if he may be paroxysmal vrs  persistent  If he notices a very regular heart rate with stable heart rhythm  prior to cardioversion,  he is to call the office to confirm with an EKG and CV will be cancelled He will continue metoprolol succinate 50 mg daily  Cbc/bmet/covid  He has had J&J vaccine   2. CHA2DS2VASc score of 4 Continue  eliquis 5 mg bid  States no missed doses x at least 3 weeks   3, CHF Stable   4. HTN  Mildly elevated today Avoid salt   5.OSA Untreated States he will not use cpap  I will see back one week after cardioversion   Shirlee Latch C. Lupita Leash [facility] [address] [place], [place] [phone]

## 2020-05-10 ENCOUNTER — Telehealth (HOSPITAL_COMMUNITY): Payer: Self-pay | Admitting: Pharmacy Technician

## 2020-05-10 NOTE — Telephone Encounter (Signed)
Received a message regarding re-enrollment of Entresto assistance with [employer]. The patient's current 30 day co-pay is $30.80. Spoke with his wife and she stated that he had been approved for the program through the 11th of this month.  Started an Landscape architect for [employer] patient assistance. It will be at the check in desk for the patient to sign. Will fax after signatures are obtained.  Will follow up.

## 2020-05-11 ENCOUNTER — Other Ambulatory Visit (HOSPITAL_COMMUNITY)
Admission: RE | Admit: 2020-05-11 | Discharge: 2020-05-11 | Disposition: A | Discharge status: Home / Self Care | Payer: Medicare Other | Source: Ambulatory Visit | Attending: Cardiology | Admitting: Cardiology

## 2020-05-11 DIAGNOSIS — Z01812 Encounter for preprocedural laboratory examination: Secondary | ICD-10-CM | POA: Insufficient documentation

## 2020-05-11 DIAGNOSIS — Z20822 Contact with and (suspected) exposure to covid-19: Secondary | ICD-10-CM | POA: Insufficient documentation

## 2020-05-11 LAB — SARS CORONAVIRUS 2 (TAT 6-24 HRS): SARS Coronavirus 2: NEGATIVE

## 2020-05-14 ENCOUNTER — Ambulatory Visit (HOSPITAL_COMMUNITY): Payer: Medicare Other | Admitting: Anesthesiology

## 2020-05-14 ENCOUNTER — Ambulatory Visit (HOSPITAL_COMMUNITY)
Admission: RE | Admit: 2020-05-14 | Discharge: 2020-05-14 | Disposition: A | Discharge status: Home / Self Care | Payer: Medicare Other | Attending: Cardiology | Admitting: Cardiology

## 2020-05-14 ENCOUNTER — Other Ambulatory Visit: Payer: Self-pay

## 2020-05-14 ENCOUNTER — Encounter (HOSPITAL_COMMUNITY)
Admission: RE | Disposition: A | Discharge status: Home / Self Care | Payer: Self-pay | Source: Home / Self Care | Attending: Cardiology

## 2020-05-14 DIAGNOSIS — I110 Hypertensive heart disease with heart failure: Secondary | ICD-10-CM | POA: Insufficient documentation

## 2020-05-14 DIAGNOSIS — I2510 Atherosclerotic heart disease of native coronary artery without angina pectoris: Secondary | ICD-10-CM | POA: Insufficient documentation

## 2020-05-14 DIAGNOSIS — Z79899 Other long term (current) drug therapy: Secondary | ICD-10-CM | POA: Insufficient documentation

## 2020-05-14 DIAGNOSIS — I4892 Unspecified atrial flutter: Secondary | ICD-10-CM | POA: Insufficient documentation

## 2020-05-14 DIAGNOSIS — Z87891 Personal history of nicotine dependence: Secondary | ICD-10-CM | POA: Insufficient documentation

## 2020-05-14 DIAGNOSIS — I4819 Other persistent atrial fibrillation: Secondary | ICD-10-CM

## 2020-05-14 DIAGNOSIS — I5043 Acute on chronic combined systolic (congestive) and diastolic (congestive) heart failure: Secondary | ICD-10-CM | POA: Diagnosis not present

## 2020-05-14 DIAGNOSIS — G4733 Obstructive sleep apnea (adult) (pediatric): Secondary | ICD-10-CM | POA: Insufficient documentation

## 2020-05-14 DIAGNOSIS — E785 Hyperlipidemia, unspecified: Secondary | ICD-10-CM | POA: Diagnosis not present

## 2020-05-14 DIAGNOSIS — I255 Ischemic cardiomyopathy: Secondary | ICD-10-CM | POA: Insufficient documentation

## 2020-05-14 DIAGNOSIS — Z7901 Long term (current) use of anticoagulants: Secondary | ICD-10-CM | POA: Diagnosis not present

## 2020-05-14 DIAGNOSIS — Z951 Presence of aortocoronary bypass graft: Secondary | ICD-10-CM | POA: Insufficient documentation

## 2020-05-14 DIAGNOSIS — I252 Old myocardial infarction: Secondary | ICD-10-CM | POA: Insufficient documentation

## 2020-05-14 DIAGNOSIS — I5042 Chronic combined systolic (congestive) and diastolic (congestive) heart failure: Secondary | ICD-10-CM | POA: Insufficient documentation

## 2020-05-14 HISTORY — PX: CARDIOVERSION: SHX1299

## 2020-05-14 LAB — POCT I-STAT, CHEM 8
BUN: 37 mg/dL — ABNORMAL HIGH (ref 8–23)
Calcium, Ion: 1.16 mmol/L (ref 1.15–1.40)
Chloride: 106 mmol/L (ref 98–111)
Creatinine, Ser: 1.2 mg/dL (ref 0.61–1.24)
Glucose, Bld: 112 mg/dL — ABNORMAL HIGH (ref 70–99)
HCT: 46 % (ref 39.0–52.0)
Hemoglobin: 15.6 g/dL (ref 13.0–17.0)
Potassium: 4.6 mmol/L (ref 3.5–5.1)
Sodium: 139 mmol/L (ref 135–145)
TCO2: 25 mmol/L (ref 22–32)

## 2020-05-14 SURGERY — CARDIOVERSION
Anesthesia: General

## 2020-05-14 MED ORDER — LIDOCAINE 2% (20 MG/ML) 5 ML SYRINGE
INTRAMUSCULAR | Status: DC | PRN
  Administered 2020-05-14: 100 mL via INTRAVENOUS

## 2020-05-14 MED ORDER — SODIUM CHLORIDE 0.9 % IV SOLN
INTRAVENOUS | Status: AC | PRN
  Administered 2020-05-14: 500 mL via INTRAMUSCULAR

## 2020-05-14 MED ORDER — PROPOFOL 10 MG/ML IV BOLUS
INTRAVENOUS | Status: DC | PRN
  Administered 2020-05-14: 80 mg via INTRAVENOUS

## 2020-05-14 NOTE — Interval H&P Note (Signed)
History and Physical Interval Note:  05/14/2020  Stanley Wells  has presented today for surgery, with the diagnosis of A-FIB.  The various methods of treatment have been discussed with the patient and family. After consideration of risks, benefits and other options for treatment, the patient has consented to  Procedure(s): CARDIOVERSION (N/A) as a surgical intervention.  The patient's history has been reviewed, patient examined, no change in status, stable for surgery.  I have reviewed the patient's chart and labs.  Questions were answered to the patient's satisfaction.     [name]

## 2020-05-14 NOTE — Anesthesia Postprocedure Evaluation (Signed)
Anesthesia Post Note  Patient: Stanley Wells  Procedure(s) Performed: CARDIOVERSION (N/A )     Patient location during evaluation: Endoscopy Anesthesia Type: General Level of consciousness: awake and alert Pain management: pain level controlled Vital Signs Assessment: post-procedure vital signs reviewed and stable Respiratory status: spontaneous breathing and respiratory function stable Cardiovascular status: stable Postop Assessment: no apparent nausea or vomiting Anesthetic complications: no   No complications documented.  Last Vitals:  Vitals:   05/14/20 1219 05/14/20 1227  BP: 102/79 114/77  Pulse: 75 71  Resp: 17 17  SpO2: 95% 95%    Last Pain:  Vitals:   05/14/20 1227  TempSrc:   PainSc: 0-No pain                 Mushka Laconte,Jordon DANIEL

## 2020-05-14 NOTE — CV Procedure (Signed)
   CARDIOVERSION NOTE  Procedure: Electrical Cardioversion Indications:  Atrial Fibrillation  Procedure Details:  Consent: Risks of procedure as well as the alternatives and risks of each were explained to the (patient/caregiver).  Consent for procedure obtained.  Time Out: Verified patient identification, verified procedure, site/side was marked, verified correct patient position, special equipment/implants available, medications/allergies/relevent history reviewed, required imaging and test results available.  Performed  Patient placed on cardiac monitor, pulse oximetry, supplemental oxygen as necessary.  Sedation given: propofol per anesthesia Pacer pads placed anterior and posterior chest.  Cardioverted 1 time(s).  Cardioverted at 200J biphasic.  Impression: Findings: Post procedure EKG shows: NSR Complications: None Patient did tolerate procedure well.  Plan: 1. Successful DCCV with a single 200J biphasic shock.  Time Spent Directly with the Patient:  30 minutes   [name], MD, [place], FACP  [place]  [facility]  Medical Director of the [facility] Lipid [facility]  Cardiovascular Risk Reduction Clinic Diplomate of the American Board of Clinical Lipidology Attending Cardiologist  Direct Dial: [phone]  Fax: [phone]  Website:  [email or web address] Nicely Ashlee Player 05/14/2020, 12:15 PM

## 2020-05-14 NOTE — Anesthesia Preprocedure Evaluation (Addendum)
Anesthesia Evaluation  Patient identified by MRN, date of birth, ID band Patient awake    Reviewed: Allergy & Precautions, NPO status , Patient's Chart, lab work & pertinent test results, reviewed documented beta blocker date and time   History of Anesthesia Complications Negative for: history of anesthetic complications  Airway Mallampati: II   Neck ROM: Full    Dental no notable dental hx. (+) Dental Advisory Given   Pulmonary sleep apnea (does not use CPAP) , former smoker,  40 pack year history, quit 2003 No inhalers Last sleep study a few years ago, cannot tolerate CPAP   Pulmonary exam normal        Cardiovascular hypertension, Pt. on medications and Pt. on home beta blockers + CAD, + CABG (2003 CABG x 5) and +CHF (LVEF 45-50%)  + dysrhythmias (s/p multiple cardioversions, on metoprolol/eliquis) Atrial Fibrillation  Rhythm:Regular Rate:Normal  Last echo 03/2020: 1. Left ventricular ejection fraction, by estimation, is 45 to 50%. The  left ventricle has mildly decreased function. The left ventricle  demonstrates global hypokinesis. There is mild left ventricular  hypertrophy. Left ventricular diastolic parameters  are indeterminate.  2. Right ventricular systolic function is normal. The right ventricular  size is normal. Tricuspid regurgitation signal is inadequate for assessing  PA pressure.  3. Left atrial size was mildly dilated.  4. The mitral valve is normal in structure. No evidence of mitral valve  regurgitation. No evidence of mitral stenosis.  5. The aortic valve is tricuspid. Aortic valve regurgitation is not  visualized. Mild aortic valve sclerosis is present, with no evidence of  aortic valve stenosis.  6. The inferior vena cava is normal in size with greater than 50%  respiratory variability, suggesting right atrial pressure of 3 mmHg.  7. The patient is in atrial fibrillation.   LD blood thinner  and rate controllers yesterday    Neuro/Psych negative neurological ROS  negative psych ROS   GI/Hepatic negative GI ROS, Neg liver ROS,   Endo/Other  Obesity BMI 32  Renal/GU Renal disease  negative genitourinary   Musculoskeletal negative musculoskeletal ROS (+)   Abdominal (+) - obese,   Peds  Hematology negative hematology ROS (+)   Anesthesia Other Findings   Reproductive/Obstetrics negative OB ROS                            Anesthesia Physical  Anesthesia Plan  ASA: III  Anesthesia Plan: General   Post-op Pain Management:    Induction: Intravenous  PONV Risk Score and Plan: 2 and Ondansetron, Dexamethasone and Treatment may vary due to age or medical condition  Airway Management Planned: Mask  Additional Equipment: None  Intra-op Plan:   Post-operative Plan:   Informed Consent: I have reviewed the patients History and Physical, chart, labs and discussed the procedure including the risks, benefits and alternatives for the proposed anesthesia with the patient or authorized representative who has indicated his/her understanding and acceptance.     Dental advisory given  Plan Discussed with: CRNA and Anesthesiologist  Anesthesia Plan Comments:        Anesthesia Quick Evaluation

## 2020-05-14 NOTE — Anesthesia Procedure Notes (Signed)
Procedure Name: General with mask airway Date/Time: 05/14/2020 12:08 PM Performed by: Mayer Camel, CRNA Pre-anesthesia Checklist: Patient identified, Emergency Drugs available, Suction available and Patient being monitored Patient Re-evaluated:Patient Re-evaluated prior to induction Oxygen Delivery Method: Ambu bag Induction Type: IV induction Placement Confirmation: positive ETCO2

## 2020-05-14 NOTE — Discharge Instructions (Signed)
Monitored Anesthesia Care Anesthesia is a term that refers to techniques, procedures, and medicines that help a person stay safe and comfortable during a medical procedure. Monitored anesthesia care, or sedation, is one type of anesthesia. Your anesthesia specialist may recommend sedation if you will be having a procedure that does not require you to be unconscious, such as:  Cataract surgery.  A dental procedure.  A biopsy.  A colonoscopy. During the procedure, you may receive a medicine to help you relax (sedative). There are three levels of sedation:  Mild sedation. At this level, you may feel awake and relaxed. You will be able to follow directions.  Moderate sedation. At this level, you will be sleepy. You may not remember the procedure.  Deep sedation. At this level, you will be asleep. You will not remember the procedure. The more medicine you are given, the deeper your level of sedation will be. Depending on how you respond to the procedure, the anesthesia specialist may change your level of sedation or the type of anesthesia to fit your needs. An anesthesia specialist will monitor you closely during the procedure. Let your health care provider know about:  Any allergies you have.  All medicines you are taking, including vitamins, herbs, eye drops, creams, and over-the-counter medicines.  Any use of steroids (by mouth or as a cream).  Any problems you or family members have had with sedatives and anesthetic medicines.  Any blood disorders you have.  Any surgeries you have had.  Any medical conditions you have, such as sleep apnea.  Whether you are pregnant or may be pregnant.  Any use of cigarettes, alcohol, or street drugs. What are the risks? Generally, this is a safe procedure. However, problems may occur, including:  Getting too much medicine (oversedation).  Nausea.  Allergic reaction to medicines.  Trouble breathing. If this happens, a breathing tube may be  used to help with breathing. It will be removed when you are awake and breathing on your own.  Heart trouble.  Lung trouble. Before the procedure Staying hydrated Follow instructions from your health care provider about hydration, which may include:  Up to 2 hours before the procedure - you may continue to drink clear liquids, such as water, clear fruit juice, black coffee, and plain tea. Eating and drinking restrictions Follow instructions from your health care provider about eating and drinking, which may include:  8 hours before the procedure - stop eating heavy meals or foods such as meat, fried foods, or fatty foods.  6 hours before the procedure - stop eating light meals or foods, such as toast or cereal.  6 hours before the procedure - stop drinking milk or drinks that contain milk.  2 hours before the procedure - stop drinking clear liquids. Medicines Ask your health care provider about:  Changing or stopping your regular medicines. This is especially important if you are taking diabetes medicines or blood thinners.  Taking medicines such as aspirin and ibuprofen. These medicines can thin your blood. Do not take these medicines before your procedure if your health care provider instructs you not to. Tests and exams  You will have a physical exam.  You may have blood tests done to show: ? How well your kidneys and liver are working. ? How well your blood can clot. General instructions  Plan to have someone take you home from the hospital or clinic.  If you will be going home right after the procedure, plan to have someone with you   for 24 hours.  What happens during the procedure?  Your blood pressure, heart rate, breathing, level of pain and overall condition will be monitored.  An IV tube will be inserted into one of your veins.  Your anesthesia specialist will give you medicines as needed to keep you comfortable during the procedure. This may mean changing the  level of sedation.  The procedure will be performed. After the procedure  Your blood pressure, heart rate, breathing rate, and blood oxygen level will be monitored until the medicines you were given have worn off.  Do not drive for 24 hours if you received a sedative.  You may: ? Feel sleepy, clumsy, or nauseous. ? Feel forgetful about what happened after the procedure. ? Have a sore throat if you had a breathing tube during the procedure. ? Vomit. This information is not intended to replace advice given to you by your health care provider. Make sure you discuss any questions you have with your health care provider. Document Revised: 07/03/2017 Document Reviewed: 11/11/2015 Elsevier Patient Education  2020 [employer] Electrical Cardioversion Electrical cardioversion is the delivery of a jolt of electricity to restore a normal rhythm to the heart. A rhythm that is too fast or is not regular keeps the heart from pumping well. In this procedure, sticky patches or metal paddles are placed on the chest to deliver electricity to the heart from a device. This procedure may be done in an emergency if:  There is low or no blood pressure as a result of the heart rhythm.  Normal rhythm must be restored as fast as possible to protect the brain and heart from further damage.  It may save a life. This may also be a scheduled procedure for irregular or fast heart rhythms that are not immediately life-threatening. Tell a health care provider about:  Any allergies you have.  All medicines you are taking, including vitamins, herbs, eye drops, creams, and over-the-counter medicines.  Any problems you or family members have had with anesthetic medicines.  Any blood disorders you have.  Any surgeries you have had.  Any medical conditions you have.  Whether you are pregnant or may be pregnant. What are the risks? Generally, this is a safe procedure. However, problems may occur,  including:  Allergic reactions to medicines.  A blood clot that breaks free and travels to other parts of your body.  The possible return of an abnormal heart rhythm within hours or days after the procedure.  Your heart stopping (cardiac arrest). This is rare. What happens before the procedure? Medicines  Your health care provider may have you start taking: ? Blood-thinning medicines (anticoagulants) so your blood does not clot as easily. ? Medicines to help stabilize your heart rate and rhythm.  Ask your health care provider about: ? Changing or stopping your regular medicines. This is especially important if you are taking diabetes medicines or blood thinners. ? Taking medicines such as aspirin and ibuprofen. These medicines can thin your blood. Do not take these medicines unless your health care provider tells you to take them. ? Taking over-the-counter medicines, vitamins, herbs, and supplements. General instructions  Follow instructions from your health care provider about eating or drinking restrictions.  Plan to have someone take you home from the hospital or clinic.  If you will be going home right after the procedure, plan to have someone with you for 24 hours.  Ask your health care provider what steps will be taken to help prevent infection.   These may include washing your skin with a germ-killing soap. What happens during the procedure?   An IV will be inserted into one of your veins.  Sticky patches (electrodes) or metal paddles may be placed on your chest.  You will be given a medicine to help you relax (sedative).  An electrical shock will be delivered. The procedure may vary among health care providers and hospitals. What can I expect after the procedure?  Your blood pressure, heart rate, breathing rate, and blood oxygen level will be monitored until you leave the hospital or clinic.  Your heart rhythm will be watched to make sure it does not change.  You  may have some redness on the skin where the shocks were given. Follow these instructions at home:  Do not drive for 24 hours if you were given a sedative during your procedure.  Take over-the-counter and prescription medicines only as told by your health care provider.  Ask your health care provider how to check your pulse. Check it often.  Rest for 48 hours after the procedure or as told by your health care provider.  Avoid or limit your caffeine use as told by your health care provider.  Keep all follow-up visits as told by your health care provider. This is important. Contact a health care provider if:  You feel like your heart is beating too quickly or your pulse is not regular.  You have a serious muscle cramp that does not go away. Get help right away if:  You have discomfort in your chest.  You are dizzy or you feel faint.  You have trouble breathing or you are short of breath.  Your speech is slurred.  You have trouble moving an arm or leg on one side of your body.  Your fingers or toes turn cold or blue. Summary  Electrical cardioversion is the delivery of a jolt of electricity to restore a normal rhythm to the heart.  This procedure may be done right away in an emergency or may be a scheduled procedure if the condition is not an emergency.  Generally, this is a safe procedure.  After the procedure, check your pulse often as told by your health care provider. This information is not intended to replace advice given to you by your health care provider. Make sure you discuss any questions you have with your health care provider. Document Revised: 02/21/2019 Document Reviewed: 02/21/2019 Elsevier Patient Education  2020 [employer]  

## 2020-05-14 NOTE — Transfer of Care (Signed)
Immediate Anesthesia Transfer of Care Note  Patient: Yoshimi Sarr Hakimian  Procedure(s) Performed: CARDIOVERSION (N/A )  Patient Location: Endoscopy Unit  Anesthesia Type:General  Level of Consciousness: awake, alert  and oriented  Airway & Oxygen Therapy: Patient Spontanous Breathing  Post-op Assessment: Report given to RN and Post -op Vital signs reviewed and stable  Post vital signs: Reviewed and stable  Last Vitals:  Vitals Value Taken Time  BP 121/93 05/14/20 1215  Temp    Pulse 65 05/14/20 1216  Resp 22 05/14/20 1216  SpO2 97 % 05/14/20 1216    Last Pain:  Vitals:   05/14/20 1139  TempSrc: Oral  PainSc: 0-No pain         Complications: No complications documented.

## 2020-05-16 NOTE — Telephone Encounter (Signed)
Sent in application via fax.  Will follow up.  

## 2020-05-17 ENCOUNTER — Encounter (HOSPITAL_COMMUNITY): Payer: Self-pay | Admitting: Internal Medicine

## 2020-05-17 NOTE — Interval H&P Note (Signed)
History and Physical Interval Note:  05/17/2020 9:53 AM  Stanley Wells  has presented today for surgery, with the diagnosis of A-FIB.  The various methods of treatment have been discussed with the patient and family. After consideration of risks, benefits and other options for treatment, the patient has consented to  Procedure(s): CARDIOVERSION (N/A) as a surgical intervention.  The patient's history has been reviewed, patient examined, no change in status, stable for surgery.  I have reviewed the patient's chart and labs.  Questions were answered to the patient's satisfaction.     [name]

## 2020-05-18 NOTE — Telephone Encounter (Signed)
Advanced Heart Failure Patient Advocate Encounter   Patient was approved to receive Entresto from [employer].  Patient ID: [id number] Effective dates: 05/17/20 through 08/03/20  Called and spoke with patient regarding approval.  Archer Asa, CPhT

## 2020-05-21 ENCOUNTER — Ambulatory Visit (HOSPITAL_COMMUNITY)
Admission: RE | Admit: 2020-05-21 | Discharge: 2020-05-21 | Disposition: A | Discharge status: Home / Self Care | Payer: Medicare Other | Source: Ambulatory Visit | Attending: Nurse Practitioner | Admitting: Nurse Practitioner

## 2020-05-21 ENCOUNTER — Other Ambulatory Visit: Payer: Self-pay

## 2020-05-21 VITALS — BP 148/82 | HR 66 | Ht 71.0 in | Wt 226.6 lb

## 2020-05-21 DIAGNOSIS — I5022 Chronic systolic (congestive) heart failure: Secondary | ICD-10-CM | POA: Insufficient documentation

## 2020-05-21 DIAGNOSIS — D6869 Other thrombophilia: Secondary | ICD-10-CM

## 2020-05-21 DIAGNOSIS — G4733 Obstructive sleep apnea (adult) (pediatric): Secondary | ICD-10-CM | POA: Diagnosis not present

## 2020-05-21 DIAGNOSIS — Z79899 Other long term (current) drug therapy: Secondary | ICD-10-CM | POA: Diagnosis not present

## 2020-05-21 DIAGNOSIS — I110 Hypertensive heart disease with heart failure: Secondary | ICD-10-CM | POA: Diagnosis not present

## 2020-05-21 DIAGNOSIS — Z7901 Long term (current) use of anticoagulants: Secondary | ICD-10-CM | POA: Diagnosis not present

## 2020-05-21 DIAGNOSIS — Z87891 Personal history of nicotine dependence: Secondary | ICD-10-CM | POA: Diagnosis not present

## 2020-05-21 DIAGNOSIS — I4819 Other persistent atrial fibrillation: Secondary | ICD-10-CM | POA: Insufficient documentation

## 2020-05-21 DIAGNOSIS — I2510 Atherosclerotic heart disease of native coronary artery without angina pectoris: Secondary | ICD-10-CM | POA: Diagnosis not present

## 2020-05-21 DIAGNOSIS — I4891 Unspecified atrial fibrillation: Secondary | ICD-10-CM | POA: Diagnosis not present

## 2020-05-21 DIAGNOSIS — I255 Ischemic cardiomyopathy: Secondary | ICD-10-CM | POA: Insufficient documentation

## 2020-05-21 DIAGNOSIS — I4892 Unspecified atrial flutter: Secondary | ICD-10-CM | POA: Insufficient documentation

## 2020-05-21 DIAGNOSIS — Z951 Presence of aortocoronary bypass graft: Secondary | ICD-10-CM | POA: Diagnosis not present

## 2020-05-21 DIAGNOSIS — E785 Hyperlipidemia, unspecified: Secondary | ICD-10-CM | POA: Diagnosis not present

## 2020-05-21 NOTE — Progress Notes (Addendum)
Primary Care Physician: Stanley Saupe, MD Referring Physician: Dr. Lalla Wells AHF- Dr. Orvan Wells is a 74 y.o. male with a h/o CAD, HTN, LV dysfunction, prior severe MR by echo in 2016, since resolved ),  persistent afib that has underwent multiple  cardioversion's,  last  one  in August, with ERAF, and ablation with Dr. Lalla Wells 04/11/20. He had a f/u in HF clinic 9/14 and was in SR with PAC's at that time. He is not aware when he is in afib. He does not track his heart rhythm at home. No swallowing or groin issues from the procedure. He has untreated sleep apnea, never was able to tolerate cpap. Minimal  alcohol, no tobacco, acceptable levels of caffeine.  He had been on amiodarone in the past in 2017 and transiently in 2019. He is out of rhythm today, afib rate controlled.   Pt was set up for cardioversion 05/14/20 and it was successful. He is now back for f/u, 10/18, and  is in SR with pc's today. He feels improved. He has been monitoring with his Pulse ox and has not seen anything that would indicate afib. Still compliant with his DOAC.   Today, he denies symptoms of palpitations, chest pain, shortness of breath, orthopnea, PND, lower extremity edema, dizziness, presyncope, syncope, or neurologic sequela. The patient is tolerating medications without difficulties and is otherwise without complaint today.   Past Medical History:  Diagnosis Date   Atrial flutter with rapid ventricular response (HCC) 07/23/2018   CAD (coronary artery disease)    a. s/p CABG 2003  b. s/p acute MI prior to CABG w/ EF <30%   Cardiomyopathy, ischemic: EF 20-25%    Cholecystitis    Chronic combined systolic and diastolic CHF (congestive heart failure) (HCC)    Chronic systolic CHF (congestive heart failure) (HCC)    a. 2D ECHO: 05/17/2014: EF 30-35%;  b. 07/2015 TEE: EF 20-25%, diff HK, sev MR, sev dil LA w/o thrombus, mod reduced RV fxn, mod dil RA, mod TR.   Hyperlipidemia     Hypertensive heart disease    Kidney stones X 1   "passed it" (08/02/2015)   OSA (obstructive sleep apnea) 10/11/2014   refuses to use CPAP   Persistent atrial fibrillation (HCC)    a. s/p succesful TEE/DCCV on 06/01/14.  b. on Eliquis;  c. 07/2015 recurrent AF-->Failed DCCV.   S/P CABG x 5 02/16/2002   LIMA to LAD, SVG to D1, SVG to OM2, Sequential SVG to PDA-RPL, saphenous vein harvest from right thigh and lower leg   Severe mitral regurgitation    Functional, improved with improvement in his EF.   Tricuspid regurgitation    Past Surgical History:  Procedure Laterality Date   ATRIAL FIBRILLATION ABLATION N/A 04/11/2020   Procedure: ATRIAL FIBRILLATION ABLATION;  Surgeon: Stanley Prude, MD;  Location: [facility] INVASIVE CV LAB;  Service: Cardiovascular;  Laterality: N/A;   CARDIAC CATHETERIZATION N/A 06/19/2014   Procedure: RIGHT/LEFT HEART CATH AND CORONARY/GRAFT ANGIOGRAPHY;  Surgeon: Stanley Bihari, MD;  Location: [facility] CATH LAB;  Service: Cardiovascular;  Laterality: N/A;   CARDIAC CATHETERIZATION  2003   CARDIAC CATHETERIZATION N/A 08/28/2015   Procedure: Right Heart Cath and Coronary/Graft Angiography;  Surgeon: Stanley Morale, MD;  Location: [facility] INVASIVE CV LAB;  Service: Cardiovascular;  Laterality: N/A;   CARDIOVERSION N/A 06/01/2014   Procedure: CARDIOVERSION;  Surgeon: Stanley Masson, MD;  Location: [facility] ENDOSCOPY;  Service: Cardiovascular;  Laterality: N/A;  CARDIOVERSION  06/11/2015; 08/02/2015   CARDIOVERSION N/A 08/02/2015   Procedure: CARDIOVERSION;  Surgeon: Stanley Stade, MD;  Location: [facility] ENDOSCOPY;  Service: Cardiovascular;  Laterality: N/A;   CARDIOVERSION N/A 10/22/2015   Procedure: CARDIOVERSION;  Surgeon: Stanley Morale, MD;  Location: [facility] ENDOSCOPY;  Service: Cardiovascular;  Laterality: N/A;   CARDIOVERSION N/A 03/15/2020   Procedure: CARDIOVERSION;  Surgeon: Stanley Morale, MD;  Location: [facility] ENDOSCOPY;  Service: Cardiovascular;  Laterality: N/A;    CARDIOVERSION N/A 05/14/2020   Procedure: CARDIOVERSION;  Surgeon: Stanley Nose, MD;  Location: [facility] ENDOSCOPY;  Service: Cardiovascular;  Laterality: N/A;   CORONARY ARTERY BYPASS GRAFT  02/16/2002   CABG X5   LAPAROSCOPIC APPENDECTOMY N/A 07/20/2016   Procedure: APPENDECTOMY LAPAROSCOPIC;  Surgeon: Stanley Rudd, MD;  Location: [facility] OR;  Service: General;  Laterality: N/A;   TEE WITHOUT CARDIOVERSION N/A 06/01/2014   Procedure: TRANSESOPHAGEAL ECHOCARDIOGRAM (TEE);  Surgeon: Stanley Masson, MD;  Location: [facility] ENDOSCOPY;  Service: Cardiovascular;  Laterality: N/A;   TEE WITHOUT CARDIOVERSION N/A 08/03/2015   Procedure: TRANSESOPHAGEAL ECHOCARDIOGRAM (TEE);  Surgeon: Stanley Masson, MD;  Location: [facility] ENDOSCOPY;  Service: Cardiovascular;  Laterality: N/A;    Current Outpatient Medications  Medication Sig Dispense Refill   acetaminophen (TYLENOL) 500 MG tablet Take 500-1,000 mg by mouth every 8 (eight) hours as needed for mild pain.      apixaban (ELIQUIS) 5 MG TABS tablet Take 1 tablet (5 mg total) by mouth 2 (two) times daily. 180 tablet 3   ENTRESTO 97-103 MG TAKE 1 TABLET BY MOUTH TWICE DAILY (Patient taking differently: Take 1 tablet by mouth 2 (two) times daily. ) 60 tablet 11   furosemide (LASIX) 40 MG tablet Take 1 tablet (40 mg total) by mouth as needed. (Patient taking differently: Take 40 mg by mouth daily as needed for fluid. ) 30 tablet 11   isosorbide mononitrate (IMDUR) 30 MG 24 hr tablet Take 1 tablet (30 mg total) by mouth daily. 90 tablet 3   metoprolol succinate (TOPROL-XL) 50 MG 24 hr tablet TAKE 1 TABLET(50 MG) BY MOUTH TWICE DAILY (Patient taking differently: Take 50 mg by mouth in the morning and at bedtime. ) 60 tablet 11   rosuvastatin (CRESTOR) 40 MG tablet Take 1 tablet (40 mg total) by mouth daily. (Patient taking differently: Take 40 mg by mouth every evening. ) 90 tablet 3   No current facility-administered medications for this encounter.    No  Known Allergies  Social History   Socioeconomic History   Marital status: Married    Spouse name: Not on file   Number of children: Not on file   Years of education: Not on file   Highest education level: Not on file  Occupational History   Occupation: Retired  Tobacco Use   Smoking status: Former Smoker    Packs/day: 1.00    Years: 40.00    Pack years: 40.00    Types: Cigarettes    Quit date: 02/02/2002    Years since quitting: 18.3   Smokeless tobacco: Never Used  Substance and Sexual Activity   Alcohol use: Yes    Comment: 08/02/2015 "I rarely have a beer"   Drug use: No   Sexual activity: Yes  Other Topics Concern   Not on file  Social History Narrative   Lives in [place] with wife.   Social Determinants of Health   Financial Resource Strain:    Difficulty of Paying Living Expenses: Not on file  Food Insecurity:  Worried About Programme researcher, broadcasting/film/videounning Out of Food in the Last Year: Not on file   [employer] Out of Food in the Last Year: Not on file  Transportation Needs:    Lack of Transportation (Medical): Not on file   Lack of Transportation (Non-Medical): Not on file  Physical Activity:    Days of Exercise per Week: Not on file   Minutes of Exercise per Session: Not on file  Stress:    Feeling of Stress : Not on file  Social Connections:    Frequency of Communication with Friends and Family: Not on file   Frequency of Social Gatherings with Friends and Family: Not on file   Attends Religious Services: Not on file   Active Member of Clubs or Organizations: Not on file   Attends BankerClub or Organization Meetings: Not on file   Marital Status: Not on file  Intimate Partner Violence:    Fear of Current or Ex-Partner: Not on file   Emotionally Abused: Not on file   Physically Abused: Not on file   Sexually Abused: Not on file    Family History  Problem Relation Age of Onset   Cancer Mother    Cancer Father    Hypertension Neg Hx    Stroke Neg Hx      ROS- All systems are reviewed and negative except as per the HPI above  Physical Exam: Vitals:   05/21/20 1001  BP: (!) 148/82  Pulse: 66  Weight: 102.8 kg  Height: 5\' 11"  (1.803 m)   Wt Readings from Last 3 Encounters:  05/21/20 102.8 kg  05/14/20 103 kg  05/09/20 103 kg    Labs: Lab Results  Component Value Date   NA 139 05/14/2020   K 4.6 05/14/2020   CL 106 05/14/2020   CO2 25 05/09/2020   GLUCOSE 112 (H) 05/14/2020   BUN 37 (H) 05/14/2020   CREATININE 1.20 05/14/2020   CALCIUM 9.1 05/09/2020   MG 2.2 08/04/2015   Lab Results  Component Value Date   INR 1.28 08/24/2015   Lab Results  Component Value Date   CHOL 111 04/17/2020   HDL 39 (L) 04/17/2020   LDLCALC 42 04/17/2020   TRIG 148 04/17/2020     GEN- The patient is well appearing, alert and oriented x 3 today.   Head- normocephalic, atraumatic Eyes-  Sclera clear, conjunctiva pink Ears- hearing intact Oropharynx- clear Neck- supple, no JVP Lymph- no cervical lymphadenopathy Lungs- Clear to ausculation bilaterally, normal work of breathing Heart-  regular rate and rhythm, no murmurs, rubs or gallops, PMI not laterally displaced GI- soft, NT, ND, + BS Extremities- no clubbing, cyanosis, or edema MS- no significant deformity or atrophy Skin- no rash or lesion Psych- euthymic mood, full affect Neuro- strength and sensation are intact  EKG-SR with PAC's, v rate 66 bpm, pr int 190 ms, qrs int 102 ms, qtc 438 ms    Assessment and Plan: 1. Persistent afib Recent successful DCCV He was unaware and  asymptomatic   he is in SR today and feels well  I have given him a pulse ox monitor today( from [facility] vascular pt [facility]) and showed him how to use it to help track his heart rhythm  He will continue metoprolol succinate 50 mg daily  He has had J&J vaccine   2. CHA2DS2VASc score of 4 Continue  eliquis 5 mg bid without missed doses  3,Chronic systolic HF Stable   4. HTN  Stable  Avoid  salt  5.OSA Untreated States he will not use cpap  F/u with Dr. Lalla Wells 12/9  Stanley Wells. [facility] [address] [place], [place] [address] [phone]

## 2020-07-04 DIAGNOSIS — Z23 Encounter for immunization: Secondary | ICD-10-CM | POA: Diagnosis not present

## 2020-07-12 ENCOUNTER — Ambulatory Visit: Payer: Medicare Other | Admitting: Cardiology

## 2020-07-17 ENCOUNTER — Encounter: Payer: Self-pay | Admitting: Cardiology

## 2020-07-17 ENCOUNTER — Other Ambulatory Visit: Payer: Self-pay

## 2020-07-17 ENCOUNTER — Ambulatory Visit (INDEPENDENT_AMBULATORY_CARE_PROVIDER_SITE_OTHER): Payer: Medicare Other | Admitting: Cardiology

## 2020-07-17 VITALS — BP 132/68 | HR 68 | Ht 71.0 in | Wt 227.8 lb

## 2020-07-17 DIAGNOSIS — I5042 Chronic combined systolic (congestive) and diastolic (congestive) heart failure: Secondary | ICD-10-CM

## 2020-07-17 DIAGNOSIS — I4819 Other persistent atrial fibrillation: Secondary | ICD-10-CM | POA: Diagnosis not present

## 2020-07-17 NOTE — Patient Instructions (Signed)
Medication Instructions:  Your physician recommends that you continue on your current medications as directed. Please refer to the Current Medication list given to you today.  *If you need a refill on your cardiac medications before your next appointment, please call your pharmacy*   Lab Work: None ordered.  If you have labs (blood work) drawn today and your tests are completely normal, you will receive your results only by:  MyChart Message (if you have MyChart) OR  A paper copy in the mail If you have any lab test that is abnormal or we need to change your treatment, we will call you to review the results.   Testing/Procedures:  Echo prior to next appointment with Dr Lalla Brothers. Your physician has requested that you have an echocardiogram. Echocardiography is a painless test that uses sound waves to create images of your heart. It provides your doctor with information about the size and shape of your heart and how well your hearts chambers and valves are working. This procedure takes approximately one hour. There are no restrictions for this procedure.      Follow-Up: At [facility], you and your health needs are our priority.  As part of our continuing mission to provide you with exceptional heart care, we have created designated Provider Care Teams.  These Care Teams include your primary Cardiologist (physician) and Advanced Practice Providers (APPs -  Physician Assistants and Nurse Practitioners) who all work together to provide you with the care you need, when you need it.  We recommend signing up for the patient portal called "MyChart".  Sign up information is provided on this After Visit Summary.  MyChart is used to connect with patients for Virtual Visits (Telemedicine).  Patients are able to view lab/test results, encounter notes, upcoming appointments, etc.  Non-urgent messages can be sent to your provider as well.   To learn more about what you can do with MyChart, go to  [email or web address].    Your next appointment:   6 month(s)  The format for your next appointment:   In Person  Provider:   Steffanie Dunn, MD

## 2020-07-17 NOTE — Progress Notes (Signed)
Electrophysiology Office Follow up Visit Note:    Date:  07/17/2020   ID:  AMAR SIPPEL, DOB July 05, 1946, MRN [id number]  PCP:  [name], MD  [facility] Cardiologist:  No primary care provider on file.  [facility] Electrophysiologist:  Lanier Prude, MD    Interval History:    Stanley Wells is a 74 y.o. male who presents for a follow up visit after A. fib ablation on 04/11/2020 for persistent atrial fibrillation (PVI plus posterior wall plus CTI).  Patient had early recurrence of A. fib after his ablation requiring cardioversion on 05/14/2020.  Since his cardioversion, he has done well maintaining normal rhythm.  He continues to tolerate Eliquis without bleeding events.  He tells me that his energy level is good and he is staying active.     Past Medical History:  Diagnosis Date  . Atrial flutter with rapid ventricular response (HCC) 07/23/2018  . CAD (coronary artery disease)    a. s/p CABG 2003  b. s/p acute MI prior to CABG w/ EF <30%  . Cardiomyopathy, ischemic: EF 20-25%   . Cholecystitis   . Chronic combined systolic and diastolic CHF (congestive heart failure) (HCC)   . Chronic systolic CHF (congestive heart failure) (HCC)    a. 2D ECHO: 05/17/2014: EF 30-35%;  b. 07/2015 TEE: EF 20-25%, diff HK, sev MR, sev dil LA w/o thrombus, mod reduced RV fxn, mod dil RA, mod TR.  Marland Kitchen Hyperlipidemia   . Hypertensive heart disease   . Kidney stones X 1   "passed it" (08/02/2015)  . OSA (obstructive sleep apnea) 10/11/2014   refuses to use CPAP  . Persistent atrial fibrillation (HCC)    a. s/p succesful TEE/DCCV on 06/01/14.  b. on Eliquis;  c. 07/2015 recurrent AF-->Failed DCCV.  . S/P CABG x 5 02/16/2002   LIMA to LAD, SVG to D1, SVG to OM2, Sequential SVG to PDA-RPL, saphenous vein harvest from right thigh and lower leg  . Severe mitral regurgitation    Functional, improved with improvement in his EF.  . Tricuspid regurgitation     Past Surgical History:   Procedure Laterality Date  . ATRIAL FIBRILLATION ABLATION N/A 04/11/2020   Procedure: ATRIAL FIBRILLATION ABLATION;  Surgeon: Lanier Prude, MD;  Location: [facility] INVASIVE CV LAB;  Service: Cardiovascular;  Laterality: N/A;  . CARDIAC CATHETERIZATION N/A 06/19/2014   Procedure: RIGHT/LEFT HEART CATH AND CORONARY/GRAFT ANGIOGRAPHY;  Surgeon: Lennette Bihari, MD;  Location: [facility] CATH LAB;  Service: Cardiovascular;  Laterality: N/A;  . CARDIAC CATHETERIZATION  2003  . CARDIAC CATHETERIZATION N/A 08/28/2015   Procedure: Right Heart Cath and Coronary/Graft Angiography;  Surgeon: Laurey Morale, MD;  Location: [facility] INVASIVE CV LAB;  Service: Cardiovascular;  Laterality: N/A;  . CARDIOVERSION N/A 06/01/2014   Procedure: CARDIOVERSION;  Surgeon: Lars Masson, MD;  Location: [facility] ENDOSCOPY;  Service: Cardiovascular;  Laterality: N/A;  . CARDIOVERSION  06/11/2015; 08/02/2015  . CARDIOVERSION N/A 08/02/2015   Procedure: CARDIOVERSION;  Surgeon: Wendall Stade, MD;  Location: [facility] ENDOSCOPY;  Service: Cardiovascular;  Laterality: N/A;  . CARDIOVERSION N/A 10/22/2015   Procedure: CARDIOVERSION;  Surgeon: Laurey Morale, MD;  Location: [facility] ENDOSCOPY;  Service: Cardiovascular;  Laterality: N/A;  . CARDIOVERSION N/A 03/15/2020   Procedure: CARDIOVERSION;  Surgeon: Laurey Morale, MD;  Location: [facility] ENDOSCOPY;  Service: Cardiovascular;  Laterality: N/A;  . CARDIOVERSION N/A 05/14/2020   Procedure: CARDIOVERSION;  Surgeon: [name], MD;  Location: [facility] ENDOSCOPY;  Service:  Cardiovascular;  Laterality: N/A;  . CORONARY ARTERY BYPASS GRAFT  02/16/2002   CABG X5  . LAPAROSCOPIC APPENDECTOMY N/A 07/20/2016   Procedure: APPENDECTOMY LAPAROSCOPIC;  Surgeon: Manus Rudd, MD;  Location: [facility] OR;  Service: General;  Laterality: N/A;  . TEE WITHOUT CARDIOVERSION N/A 06/01/2014   Procedure: TRANSESOPHAGEAL ECHOCARDIOGRAM (TEE);  Surgeon: Lars Masson, MD;  Location: [facility] ENDOSCOPY;  Service: Cardiovascular;   Laterality: N/A;  . TEE WITHOUT CARDIOVERSION N/A 08/03/2015   Procedure: TRANSESOPHAGEAL ECHOCARDIOGRAM (TEE);  Surgeon: Lars Masson, MD;  Location: [facility] ENDOSCOPY;  Service: Cardiovascular;  Laterality: N/A;    Current Medications: Current Meds  Medication Sig  . acetaminophen (TYLENOL) 500 MG tablet Take 500-1,000 mg by mouth every 8 (eight) hours as needed for mild pain.   Marland Kitchen apixaban (ELIQUIS) 5 MG TABS tablet Take 1 tablet (5 mg total) by mouth 2 (two) times daily.  Marland Kitchen ENTRESTO 97-103 MG TAKE 1 TABLET BY MOUTH TWICE DAILY  . furosemide (LASIX) 40 MG tablet Take 1 tablet (40 mg total) by mouth as needed.  . isosorbide mononitrate (IMDUR) 30 MG 24 hr tablet Take 1 tablet (30 mg total) by mouth daily.  . metoprolol succinate (TOPROL-XL) 50 MG 24 hr tablet TAKE 1 TABLET(50 MG) BY MOUTH TWICE DAILY  . rosuvastatin (CRESTOR) 40 MG tablet Take 1 tablet (40 mg total) by mouth daily.     Allergies:   Patient has no known allergies.   Social History   Socioeconomic History  . Marital status: Married    Spouse name: Not on file  . Number of children: Not on file  . Years of education: Not on file  . Highest education level: Not on file  Occupational History  . Occupation: Retired  Tobacco Use  . Smoking status: Former Smoker    Packs/day: 1.00    Years: 40.00    Pack years: 40.00    Types: Cigarettes    Quit date: 02/02/2002    Years since quitting: 18.4  . Smokeless tobacco: Never Used  Substance and Sexual Activity  . Alcohol use: Yes    Comment: 08/02/2015 "I rarely have a beer"  . Drug use: No  . Sexual activity: Yes  Other Topics Concern  . Not on file  Social History Narrative   Lives in [place] with wife.   Social Determinants of Health   Financial Resource Strain: Not on file  Food Insecurity: Not on file  Transportation Needs: Not on file  Physical Activity: Not on file  Stress: Not on file  Social Connections: Not on file     Family History: The  patient's family history includes Cancer in his father and mother. There is no history of Hypertension or Stroke.  ROS:   Please see the history of present illness.    All other systems reviewed and are negative.  EKGs/Labs/Other Studies Reviewed:    The following studies were reviewed today: Ablation report, prior notes  EKG:  The ekg ordered today demonstrates normal sinus rhythm with occasional PACs  Recent Labs: 04/17/2020: ALT 26; B Natriuretic Peptide 174.9 05/09/2020: Platelets 227 05/14/2020: BUN 37; Creatinine, Ser 1.20; Hemoglobin 15.6; Potassium 4.6; Sodium 139  Recent Lipid Panel    Component Value Date/Time   CHOL 111 04/17/2020 0939   TRIG 148 04/17/2020 0939   HDL 39 (L) 04/17/2020 0939   CHOLHDL 2.8 04/17/2020 0939   VLDL 30 04/17/2020 0939   LDLCALC 42 04/17/2020 0939    Physical Exam:    VS:  BP 132/68   Pulse 68   Ht 5\' 11"  (1.803 m)   Wt 227 lb 12.8 oz (103.3 kg)   SpO2 97%   BMI 31.77 kg/m     Wt Readings from Last 3 Encounters:  07/17/20 227 lb 12.8 oz (103.3 kg)  05/21/20 226 lb 9.6 oz (102.8 kg)  05/14/20 227 lb 1.2 oz (103 kg)     GEN: Well nourished, well developed in no acute distress HEENT: Normal NECK: No JVD; No carotid bruits LYMPHATICS: No lymphadenopathy CARDIAC: RRR, no murmurs, rubs, gallops RESPIRATORY:  Clear to auscultation without rales, wheezing or rhonchi  ABDOMEN: Soft, non-tender, non-distended MUSCULOSKELETAL:  No edema; No deformity  SKIN: Warm and dry NEUROLOGIC:  Alert and oriented x 3 PSYCHIATRIC:  Normal affect   ASSESSMENT:    1. Persistent atrial fibrillation (HCC)   2. Chronic combined systolic and diastolic congestive heart failure (HCC)    PLAN:    In order of problems listed above:  1. Persistent atrial fibrillation Post PVI, posterior wall, CTI ablation in early September.  Did require cardioversion for early recurrence of atrial fibrillation post ablation but has done well after his cardioversion  without recurrence of atrial arrhythmia.  Doing well on Eliquis.  I will plan on seeing him back in clinic in 6 months with an echo at that time to reassess his left ventricular function.  2.  Chronic combined systolic and diastolic heart failure .  Euvolemic on exam today.  Warm and well-perfused.  NYHA class I-II symptoms.  Recommend he continue his Entresto, metoprolol, isosorbide and nitrate and Lasix.  Follow-up with Dr. October in the heart failure clinic.  Medication Adjustments/Labs and Tests Ordered: Current medicines are reviewed at length with the patient today.  Concerns regarding medicines are outlined above.  Orders Placed This Encounter  Procedures  . EKG 12-Lead  . ECHOCARDIOGRAM COMPLETE   No orders of the defined types were placed in this encounter.    Signed, Shirlee Latch, MD, [facility]  07/17/2020 9:05 PM    Electrophysiology  [facility]

## 2020-07-26 ENCOUNTER — Telehealth (HOSPITAL_COMMUNITY): Payer: Self-pay | Admitting: Pharmacy Technician

## 2020-07-26 NOTE — Telephone Encounter (Signed)
Sent in [employer] application via fax.  Will follow up.  

## 2020-08-01 ENCOUNTER — Ambulatory Visit (HOSPITAL_COMMUNITY)
Admission: RE | Admit: 2020-08-01 | Discharge: 2020-08-01 | Disposition: A | Discharge status: Home / Self Care | Payer: Medicare Other | Source: Ambulatory Visit | Attending: Cardiology | Admitting: Cardiology

## 2020-08-01 ENCOUNTER — Other Ambulatory Visit: Payer: Self-pay

## 2020-08-01 ENCOUNTER — Encounter (HOSPITAL_COMMUNITY): Payer: Self-pay | Admitting: Cardiology

## 2020-08-01 VITALS — BP 140/90 | HR 97 | Wt 228.4 lb

## 2020-08-01 DIAGNOSIS — I340 Nonrheumatic mitral (valve) insufficiency: Secondary | ICD-10-CM | POA: Diagnosis not present

## 2020-08-01 DIAGNOSIS — I480 Paroxysmal atrial fibrillation: Secondary | ICD-10-CM

## 2020-08-01 DIAGNOSIS — I5043 Acute on chronic combined systolic (congestive) and diastolic (congestive) heart failure: Secondary | ICD-10-CM | POA: Diagnosis not present

## 2020-08-01 DIAGNOSIS — Z951 Presence of aortocoronary bypass graft: Secondary | ICD-10-CM | POA: Insufficient documentation

## 2020-08-01 DIAGNOSIS — N183 Chronic kidney disease, stage 3 unspecified: Secondary | ICD-10-CM | POA: Diagnosis not present

## 2020-08-01 DIAGNOSIS — I252 Old myocardial infarction: Secondary | ICD-10-CM | POA: Insufficient documentation

## 2020-08-01 DIAGNOSIS — I5042 Chronic combined systolic (congestive) and diastolic (congestive) heart failure: Secondary | ICD-10-CM | POA: Diagnosis not present

## 2020-08-01 DIAGNOSIS — I5022 Chronic systolic (congestive) heart failure: Secondary | ICD-10-CM | POA: Diagnosis not present

## 2020-08-01 DIAGNOSIS — Z87891 Personal history of nicotine dependence: Secondary | ICD-10-CM | POA: Diagnosis not present

## 2020-08-01 DIAGNOSIS — Z9049 Acquired absence of other specified parts of digestive tract: Secondary | ICD-10-CM | POA: Diagnosis not present

## 2020-08-01 DIAGNOSIS — G4733 Obstructive sleep apnea (adult) (pediatric): Secondary | ICD-10-CM | POA: Insufficient documentation

## 2020-08-01 DIAGNOSIS — I130 Hypertensive heart and chronic kidney disease with heart failure and stage 1 through stage 4 chronic kidney disease, or unspecified chronic kidney disease: Secondary | ICD-10-CM | POA: Insufficient documentation

## 2020-08-01 DIAGNOSIS — Z7901 Long term (current) use of anticoagulants: Secondary | ICD-10-CM | POA: Diagnosis not present

## 2020-08-01 DIAGNOSIS — I255 Ischemic cardiomyopathy: Secondary | ICD-10-CM | POA: Insufficient documentation

## 2020-08-01 DIAGNOSIS — I2510 Atherosclerotic heart disease of native coronary artery without angina pectoris: Secondary | ICD-10-CM | POA: Insufficient documentation

## 2020-08-01 DIAGNOSIS — Z79899 Other long term (current) drug therapy: Secondary | ICD-10-CM | POA: Insufficient documentation

## 2020-08-01 LAB — BASIC METABOLIC PANEL
Anion gap: 8 (ref 5–15)
BUN: 15 mg/dL (ref 8–23)
CO2: 23 mmol/L (ref 22–32)
Calcium: 9 mg/dL (ref 8.9–10.3)
Chloride: 106 mmol/L (ref 98–111)
Creatinine, Ser: 1.04 mg/dL (ref 0.61–1.24)
GFR, Estimated: >60 mL/min (ref >60)
Glucose, Bld: 119 mg/dL — ABNORMAL HIGH (ref 70–99)
Potassium: 4 mmol/L (ref 3.5–5.1)
Sodium: 137 mmol/L (ref 135–145)

## 2020-08-01 LAB — CBC
HCT: 46.8 % (ref 39.0–52.0)
Hemoglobin: 15.2 g/dL (ref 13.0–17.0)
MCH: 29.8 pg (ref 26.0–34.0)
MCHC: 32.5 g/dL (ref 30.0–36.0)
MCV: 91.8 fL (ref 80.0–100.0)
Platelets: 189 10*3/uL (ref 150–400)
RBC: 5.1 MIL/uL (ref 4.22–5.81)
RDW: 14.6 % (ref 11.5–15.5)
WBC: 8.8 10*3/uL (ref 4.0–10.5)
nRBC: 0 % (ref 0.0–0.2)

## 2020-08-01 MED ORDER — METOPROLOL SUCCINATE ER 50 MG PO TB24
75.0000 mg | ORAL_TABLET | Freq: Two times a day (BID) | ORAL | 3 refills | Status: DC
Start: 2020-08-01 — End: 2021-01-25

## 2020-08-01 NOTE — Patient Instructions (Addendum)
Labs done today. We will contact you only if your labs are abnormal.  INCREASE Metoprolol to 75mg  (1 & 1/2 tablets) by mouth 2 times daily.  No other medication changes were made. Please continue all other medications as prescribed.   Your physician recommends that you schedule a follow-up appointment in: 4 months  If you have any questions or concerns before your next appointment please send a message through [place] or call our office at [phone].    TO LEAVE A MESSAGE FOR THE NURSE SELECT OPTION 2, PLEASE LEAVE A MESSAGE INCLUDING: . YOUR NAME . DATE OF BIRTH . CALL BACK NUMBER . REASON FOR CALL**this is important as we prioritize the call backs  YOU WILL RECEIVE A CALL BACK THE SAME DAY AS LONG AS YOU CALL BEFORE 4:00 PM   Do the following things EVERYDAY: 1) Weigh yourself in the morning before breakfast. Write it down and keep it in a log. 2) Take your medicines as prescribed 3) Eat low salt foods--Limit salt (sodium) to 2000 mg per day.  4) Stay as active as you can everyday 5) Limit all fluids for the day to less than 2 liters  At the [facility], you and your health needs are our priority. As part of our continuing mission to provide you with exceptional heart care, we have created designated Provider Care Teams. These Care Teams include your primary Cardiologist (physician) and Advanced Practice Providers (APPs- Physician Assistants and Nurse Practitioners) who all work together to provide you with the care you need, when you need it.   You may see any of the following providers on your designated Care Team at your next follow up: [phone] Dr Marland Kitchen . Dr Arvilla Meres . Marca Ancona, NP . Tonye Becket, PA . Robbie Lis, PharmD   Please be sure to bring in all your medications bottles to every appointment.

## 2020-08-01 NOTE — Progress Notes (Signed)
Date:  08/01/2020   ID:  Stanley Wells, DOB [birth year], MRN [id number]   Provider location: [facility] Type of Visit: Established patient   PCP:  Stanley Saupe, MD  Cardiologist: Dr. Shirlee Wells   History of Present Illness: Stanley Wells is a 74 y.o. male who has a history of CAD, ischemic cardiomyopathy/chronic systolic CHF, severe functional mitral regurgitation, and persistent atrial fibrillation.  Patient had MI then CABG in 2003.  Cardiac cath in 2015 showed only LIMA-LAD and SVG-OM patent.  In 2015, EF was 30-35%.  He had atrial flutter in 2015 and again in 11/16, both times was cardioverted.  After the 11/16 cardioversion, he stopped all his meds.  He was admitted again in 12/16 with atrial fibrillation/RVR and a CHF exacerbation.  He was diuresed and started on Eliquis with plan for DCCV after 4 weeks of anticoagulation.  He was brought back at the end of December for cardioversion, but this was unsuccessful.  TEE in 12/16 showed EF 20-25% with severe functional MR.    He was seen by Dr [name] with plan for tentative plan for mitral valve repair versus replacement and possible tricuspid valve repair along with MAZE procedure.  He was referred to CHF clinic for optimization prior to surgery.   He went for left and right heart cath. RHC showed good left and right heart filling pressures but cardiac output was low.  Coronary angiography was stable with patent LIMA-LAD and SVG-OM.  Other SVGs occluded.    He had DCCV in 3/17 on amiodarone, this was later stopped.  He had a CPX in 3/17 with only mild functional impairment from CHF.    He had appendicitis with appendectomy in 12/17.   Evaluated in [facility] in 07/23/2018. He was in atrial flutter in the setting of a URI and underwent DC-CV.  He was put back on amiodarone. Amiodarone later stopped.   Echo in 7/20 showed EF 45-50% with septal bounce on my review with mild LV dilation, normal RV size and  systolic function, IVC normal.   Echo in 8/21 showed EF 45-50% with mild LVH, diffuse hypokinesis, normal RV, no MR.   Patient had atrial fibrillation ablation in 9/21.  He had ERAF and DCCV in 10/21.    Patient presents for followup of CHF, CAD, and atrial fibrillation. He is in NSR today.  No palpitations.  He walks to the mailbox without dyspnea.  No chest pain.  No orthopnea/PND.  He has not been particularly active recently. Weight stable. He has been told that he has OSA but does not want to use CPAP. Marland Kitchen   ECG (personally reviewed): NSR, PVCs   Labs (9/16): LDL 126, HDL 38 Labs (12/16): K 4, creatinine 1.15 Labs (1/17): K 4.4, creatinine 1.09, LDL 42, HDL 39 Labs (10/01/2015): K 4.5 Creatinine 1.27 Dig level 0.4  Labs (4/17): digoxin 0.4, LFTs normal, TSH normal Labs (5/17): K 4.5, creatinine 1.36 Labs (6/17): K 5, creatinine 1.88, BNP 88, LDL 54, HDl 39, digoxin 0.8, LFTs normal, TSH normal Labs (9/17): K 4.8, creatinine 1.3, digoxin 0.6, TSH normal, LFTs normal Labs (12/17): K 3.2, creatinine 1.33, digoxin 0.3 Labs (7/18): K 4.8, digoxin 0.2, creatinine 1.22 Labs (10/18): LDL 41, HDL 39, digoxin 0.3 Labs (7/82): K 4.8, creatinine 1.56, hgb 13.9 Labs (4/19): digoxin 0.5, K 4.6, creatinine 1.37 Labs (07/23/2018): K 4.6 Creatinine 1.49, hgb 13.8 Labs (5/20): K 4.5, creatinine 1.4, LDL 39, hgb 13.5 Labs (11/20):  K 4.5, creatinine 1.05, hgb 13.4 Labs (2/21): K 4.1, creatinine 1.26, LDL 38, TGs 178 Labs (9/21): LDL 42 Labs (10/21): K 5.2, creatinine 1.14  PMH: 1. CAD: s/p MI in 2003.  CABG x 5 in 2003 with LIMA-LAD, SVG-D1, SVG-OM2, seq SVG-PDA/PLV.  LHC/RHC 11/15 with native arteries essentially occluded.  SVG-D1 and seq SVG-PDA/PLV occluded, LIMA and SVG-OM patent.  LHC (1/17) with patent LIMA-LAD, occluded SVG-D, patent SVG-OM => SVG touches down on inferior branch of OM1 that backfills to superior branch of OM1, there are serial 70-80% stenoses in the proximal inferior OM1  branch that may compromise backflow to superior branch OM1, SVG-PDA/PLV totally occluded, EF 25% . 2. Atrial flutter: DCCV 2015.  Recurred 11/16 with DCCV.   3. Chronic systolic CHF: Ischemic cardiomyopathy.  Echo (2015) with moderate-severe MR, EF 30-35%.  TEE (12/16) with severe central MR (functional), EF 20-25% with diffuse hypokinesis, moderately decreased RV systolic function, moderate TR.  RHC (1/17) with mean RA 5, PA 27/12 mean 18, mean PCWP 10, CI 1.7 Fick/1.83 thermo. EF 25% on 1/17 LV-gram.  Cardiac MRI (2/17) with EF 19%, dilated LV, images not adequate to assess delayed enhancement.  - CPX (3/17) with peak VO2 20.4, VE/VCO2 slope 34, RER 1.11 => mild functional impairment.  - Echo (5/17) with EF 40-45%, trivial MR, mild RV dilation with low normal RV systolic function.  - Echo (5/18) with EF 40-45%, basal to mid inferior HK, moderate LVH, trivial MR.  - Echo (5/19) with EF 55-60%, no significant MR.  - Echo (7/20) with EF 45-50% on my review with mild LV dilation, normal RV size and systolic function, IVC normal, trivial MR.   - Echo (8/21) with EF 45-50% with mild LVH, diffuse hypokinesis, normal RV, no MR.  4. Mitral regurgitation: Severe on 12/16 TEE, but was only trivial on echo in 5/17 and 5/18 with aggressive medical treatment. 5/19 echo with no significant MR. 8/21 echo with no significant MR.  5. OSA: Not using CPAP.  6. HTN 7. Nephrolithiasis 8. Atrial fibrillation/flutter: Noted in 12/16 initially.  Paroxysmal.  Had DCCV to NSR in 3/17.  - Atrial flutter 12/19 => DCCV to NSR, briefly on amiodarone.  - Atrial fibrillation ablation 9/21.  - DCCV to NSR 10/21 9. PFTs (1/17) with minimal obstructive airways disease but severely decreased DLCO (37% predicted), possibly suggestive of pulmonary vascular disease.  10. ABIs (5/17) were normal.  11. CKD 12. Appendectomy 12/17.   Current Outpatient Medications  Medication Sig Dispense Refill  . acetaminophen (TYLENOL) 500 MG  tablet Take 500-1,000 mg by mouth every 8 (eight) hours as needed for mild pain.     Marland Kitchen apixaban (ELIQUIS) 5 MG TABS tablet Take 1 tablet (5 mg total) by mouth 2 (two) times daily. 180 tablet 3  . ENTRESTO 97-103 MG TAKE 1 TABLET BY MOUTH TWICE DAILY 60 tablet 11  . furosemide (LASIX) 40 MG tablet Take 1 tablet (40 mg total) by mouth as needed. 30 tablet 11  . isosorbide mononitrate (IMDUR) 30 MG 24 hr tablet Take 1 tablet (30 mg total) by mouth daily. 90 tablet 3  . rosuvastatin (CRESTOR) 40 MG tablet Take 1 tablet (40 mg total) by mouth daily. 90 tablet 3  . metoprolol succinate (TOPROL-XL) 50 MG 24 hr tablet Take 1.5 tablets (75 mg total) by mouth 2 (two) times daily. Take with or immediately following a meal. 270 tablet 3   No current facility-administered medications for this encounter.    Allergies:  Patient has no known allergies.   Social History:  The patient  reports that he quit smoking about 18 years ago. His smoking use included cigarettes. He has a 40.00 pack-year smoking history. He has never used smokeless tobacco. He reports current alcohol use. He reports that he does not use drugs.   Family History:  The patient's family history includes Cancer in his father and mother.   ROS:  Please see the history of present illness.   All other systems are personally reviewed and negative.   Exam:   BP 140/90   Pulse 97   Wt 103.6 kg (228 lb 6.4 oz)   SpO2 96%   BMI 31.86 kg/m  General: NAD Neck: No JVD, no thyromegaly or thyroid nodule.  Lungs: Clear to auscultation bilaterally with normal respiratory effort. CV: Nondisplaced PMI.  Heart regular S1/S2, no S3/S4, no murmur.  No peripheral edema.  No carotid bruit.  Normal pedal pulses.  Abdomen: Soft, nontender, no hepatosplenomegaly, no distention.  Skin: Intact without lesions or rashes.  Neurologic: Alert and oriented x 3.  Psych: Normal affect. Extremities: No clubbing or cyanosis.  HEENT: Normal.   Recent  Labs: 04/17/2020: ALT 26; B Natriuretic Peptide 174.9 08/01/2020: BUN 15; Creatinine, Ser 1.04; Hemoglobin 15.2; Platelets 189; Potassium 4.0; Sodium 137  Personally reviewed   Wt Readings from Last 3 Encounters:  08/01/20 103.6 kg (228 lb 6.4 oz)  07/17/20 103.3 kg (227 lb 12.8 oz)  05/21/20 102.8 kg (226 lb 9.6 oz)      ASSESSMENT AND PLAN:  1. Chronic systolic CHF: EF 93-23% on TEE from 12/16.  cMRI 2/17 with EF 19%, unable to assess for delayed enhancement on this study.  Ischemic cardiomyopathy.  Low cardiac output by RHC in 1/17 but filling pressures optimized.  Despite low output on that RHC, he has been doing quite well with medical treatment.  CPX (3/17) with only mild functional limitation.  Echo in 8/21 with EF 45-50%, normal RV size and systolic function, IVC normal, no significant MR.  He is not volume overloaded on exam, weight stable, NYHA class II.  - He has not needed Lasix.   - With elevated BP, will increase Toprol XL to 75 mg bid.   - Continue Entresto 97/103 bid.  BMET today.  - Hold off on spironolactone as K has been mildly elevated.   2. CAD: s/p CABG.  On 1/17 cath, only LIMA-LAD and SVG-OM2 still patent. No chest pain.  - No ASA given use of apixaban. - Continue Crestor, good lipids in 9/21.    3. Atrial fibrillation/flutter: Paroxysmal.  Successful DCCV in 3/17 on amiodarone.  Amiodarone stopped, then he had atrial flutter in 12/19 that was cardioverted back to NSR and amiodarone was restarted transiently.  He had atrial fibrillation ablation in 9/21 and DCCV again in 10/21.  He is now in NSR.  - Continue apixaban.  - Rate control reasonable with Toprol XL.  4. Mitral regurgitation: Severe by 12/16 TEE, probably functional.  Much improved on 5/17 echo, only trivial.  Trivial on 5/18 echo. No significant MR on echo 12/2017. Trivial on 7/20 echo. Minimal MR on 8/21 echo.  5. CKD: Stage III. BMET today.    Followup in 4 months.   Signed, Marca Ancona, MD   08/01/2020  [facility] [address] [facility] [place] [address] [phone] (office) [phone] (fax)

## 2020-08-25 ENCOUNTER — Other Ambulatory Visit (HOSPITAL_COMMUNITY): Payer: Self-pay | Admitting: Cardiology

## 2020-08-27 MED ORDER — APIXABAN 5 MG PO TABS: ORAL_TABLET | ORAL | 3 refills | Status: DC

## 2020-08-27 NOTE — Addendum Note (Signed)
Addended by: Jennier Schissler R on: 08/27/2020 03:14 PM   Modules accepted: Orders  

## 2020-08-28 ENCOUNTER — Ambulatory Visit: Payer: Medicare Other | Admitting: Cardiology

## 2020-09-18 ENCOUNTER — Telehealth (HOSPITAL_COMMUNITY): Payer: Self-pay | Admitting: Pharmacy Technician

## 2020-09-18 NOTE — Telephone Encounter (Signed)
Received communication from [employer] that the date of the provider's portion was unclear, updated and re-faxed.

## 2020-09-25 NOTE — Telephone Encounter (Signed)
Called [employer] to check the status of the patient's application. Representative stated that they received the updated application date. Patient's application is in the insurance verification portion of the process.

## 2020-10-05 NOTE — Telephone Encounter (Signed)
[facility] Patient Advocate Encounter   Patient was approved to receive Entresto from [employer]  Patient ID: [id number] Effective dates: 10/05/20 through 08/03/21  Spoke with the patient.  Archer Asa, CPhT

## 2020-11-22 ENCOUNTER — Other Ambulatory Visit (HOSPITAL_COMMUNITY): Payer: Self-pay | Admitting: Cardiology

## 2020-11-30 ENCOUNTER — Encounter (HOSPITAL_COMMUNITY): Payer: Self-pay | Admitting: Cardiology

## 2020-11-30 ENCOUNTER — Ambulatory Visit (HOSPITAL_COMMUNITY)
Admission: RE | Admit: 2020-11-30 | Discharge: 2020-11-30 | Disposition: A | Discharge status: Home / Self Care | Payer: Medicare Other | Source: Ambulatory Visit | Attending: Cardiology | Admitting: Cardiology

## 2020-11-30 ENCOUNTER — Other Ambulatory Visit: Payer: Self-pay

## 2020-11-30 VITALS — BP 110/70 | HR 61 | Wt 228.6 lb

## 2020-11-30 DIAGNOSIS — I4892 Unspecified atrial flutter: Secondary | ICD-10-CM | POA: Diagnosis not present

## 2020-11-30 DIAGNOSIS — I5042 Chronic combined systolic (congestive) and diastolic (congestive) heart failure: Secondary | ICD-10-CM

## 2020-11-30 DIAGNOSIS — I340 Nonrheumatic mitral (valve) insufficiency: Secondary | ICD-10-CM | POA: Diagnosis not present

## 2020-11-30 DIAGNOSIS — Z87891 Personal history of nicotine dependence: Secondary | ICD-10-CM | POA: Insufficient documentation

## 2020-11-30 DIAGNOSIS — N183 Chronic kidney disease, stage 3 unspecified: Secondary | ICD-10-CM | POA: Diagnosis not present

## 2020-11-30 DIAGNOSIS — Z951 Presence of aortocoronary bypass graft: Secondary | ICD-10-CM | POA: Diagnosis not present

## 2020-11-30 DIAGNOSIS — Z79899 Other long term (current) drug therapy: Secondary | ICD-10-CM | POA: Diagnosis not present

## 2020-11-30 DIAGNOSIS — I255 Ischemic cardiomyopathy: Secondary | ICD-10-CM | POA: Diagnosis not present

## 2020-11-30 DIAGNOSIS — I2510 Atherosclerotic heart disease of native coronary artery without angina pectoris: Secondary | ICD-10-CM | POA: Insufficient documentation

## 2020-11-30 DIAGNOSIS — Z7901 Long term (current) use of anticoagulants: Secondary | ICD-10-CM | POA: Diagnosis not present

## 2020-11-30 DIAGNOSIS — E785 Hyperlipidemia, unspecified: Secondary | ICD-10-CM | POA: Diagnosis not present

## 2020-11-30 DIAGNOSIS — G4733 Obstructive sleep apnea (adult) (pediatric): Secondary | ICD-10-CM | POA: Diagnosis not present

## 2020-11-30 DIAGNOSIS — I252 Old myocardial infarction: Secondary | ICD-10-CM | POA: Insufficient documentation

## 2020-11-30 DIAGNOSIS — I130 Hypertensive heart and chronic kidney disease with heart failure and stage 1 through stage 4 chronic kidney disease, or unspecified chronic kidney disease: Secondary | ICD-10-CM | POA: Diagnosis not present

## 2020-11-30 DIAGNOSIS — I480 Paroxysmal atrial fibrillation: Secondary | ICD-10-CM | POA: Insufficient documentation

## 2020-11-30 DIAGNOSIS — I5022 Chronic systolic (congestive) heart failure: Secondary | ICD-10-CM | POA: Insufficient documentation

## 2020-11-30 LAB — BASIC METABOLIC PANEL
Anion gap: 9 (ref 5–15)
BUN: 22 mg/dL (ref 8–23)
CO2: 21 mmol/L — ABNORMAL LOW (ref 22–32)
Calcium: 9 mg/dL (ref 8.9–10.3)
Chloride: 108 mmol/L (ref 98–111)
Creatinine, Ser: 1.15 mg/dL (ref 0.61–1.24)
GFR, Estimated: >60 mL/min (ref >60)
Glucose, Bld: 128 mg/dL — ABNORMAL HIGH (ref 70–99)
Potassium: 4.1 mmol/L (ref 3.5–5.1)
Sodium: 138 mmol/L (ref 135–145)

## 2020-11-30 LAB — CBC
HCT: 47.1 % (ref 39.0–52.0)
Hemoglobin: 14.8 g/dL (ref 13.0–17.0)
MCH: 29.8 pg (ref 26.0–34.0)
MCHC: 31.4 g/dL (ref 30.0–36.0)
MCV: 95 fL (ref 80.0–100.0)
Platelets: 210 10*3/uL (ref 150–400)
RBC: 4.96 MIL/uL (ref 4.22–5.81)
RDW: 14.3 % (ref 11.5–15.5)
WBC: 9.4 10*3/uL (ref 4.0–10.5)
nRBC: 0 % (ref 0.0–0.2)

## 2020-11-30 LAB — LIPID PANEL
Cholesterol: 121 mg/dL (ref 0–200)
HDL: 36 mg/dL — ABNORMAL LOW (ref >40)
LDL Cholesterol: 52 mg/dL (ref 0–99)
Total CHOL/HDL Ratio: 3.4 RATIO
Triglycerides: 166 mg/dL — ABNORMAL HIGH (ref <150)
VLDL: 33 mg/dL (ref 0–40)

## 2020-11-30 NOTE — Patient Instructions (Signed)
Labs done today, we will call you for abnormal results  Please call our office in September to schedule your follow up appointment  If you have any questions or concerns before your next appointment please send [place] a message through [place] or call our office at [phone].    TO LEAVE A MESSAGE FOR THE NURSE SELECT OPTION 2, PLEASE LEAVE A MESSAGE INCLUDING: . YOUR NAME . DATE OF BIRTH . CALL BACK NUMBER . REASON FOR CALL**this is important as we prioritize the call backs  YOU WILL RECEIVE A CALL BACK THE SAME DAY AS LONG AS YOU CALL BEFORE 4:00 PM  At the [facility], you and your health needs are our priority. As part of our continuing mission to provide you with exceptional heart care, we have created designated Provider Care Teams. These Care Teams include your primary Cardiologist (physician) and Advanced Practice Providers (APPs- Physician Assistants and Nurse Practitioners) who all work together to provide you with the care you need, when you need it.   You may see any of the following providers on your designated Care Team at your next follow up: Marland Kitchen Dr Arvilla Meres . Dr Marca Ancona . Dr Thornell Mule . Tonye Becket, NP . Robbie Lis, PA . Shanda Bumps [place],NP . Karle Plumber, PharmD   Please be sure to bring in all your medications bottles to every appointment.

## 2020-12-02 NOTE — Progress Notes (Signed)
Date:  12/02/2020   ID:  Stanley Wells, DOB 1946-07-27, MRN [id number]   Provider location: [facility] Type of Visit: Established patient   PCP:  [name], MD  Cardiologist: Dr. Shirlee Latch   History of Present Illness: Stanley Wells is a 75 y.o. male who has a history of CAD, ischemic cardiomyopathy/chronic systolic CHF, severe functional mitral regurgitation, and persistent atrial fibrillation.  Patient had MI then CABG in 2003.  Cardiac cath in 2015 showed only LIMA-LAD and SVG-OM patent.  In 2015, EF was 30-35%.  He had atrial flutter in 2015 and again in 11/16, both times was cardioverted.  After the 11/16 cardioversion, he stopped all his meds.  He was admitted again in 12/16 with atrial fibrillation/RVR and a CHF exacerbation.  He was diuresed and started on Eliquis with plan for DCCV after 4 weeks of anticoagulation.  He was brought back at the end of December for cardioversion, but this was unsuccessful.  TEE in 12/16 showed EF 20-25% with severe functional MR.    He was seen by Dr [name] with plan for tentative plan for mitral valve repair versus replacement and possible tricuspid valve repair along with MAZE procedure.  He was referred to CHF clinic for optimization prior to surgery.   He went for left and right heart cath. RHC showed good left and right heart filling pressures but cardiac output was low.  Coronary angiography was stable with patent LIMA-LAD and SVG-OM.  Other SVGs occluded.    He had DCCV in 3/17 on amiodarone, this was later stopped.  He had a CPX in 3/17 with only mild functional impairment from CHF.    He had appendicitis with appendectomy in 12/17.   Evaluated [facility] in 07/23/2018. He was in atrial flutter in the setting of a URI and underwent DC-CV.  He was put back on amiodarone. Amiodarone later stopped.   Echo in 7/20 showed EF 45-50% with septal bounce on my review with mild LV dilation, normal RV size and  systolic function, IVC normal.   Echo in 8/21 showed EF 45-50% with mild LVH, diffuse hypokinesis, normal RV, no MR.   Patient had atrial fibrillation ablation in 9/21.  He had ERAF and DCCV in 10/21.    Patient presents for followup of CHF, CAD, and atrial fibrillation. He is in NSR today.  No palpitations.  No exertional dyspnea or chest pain.  His legs get weak after walking about 1/4 mile but no claudication-type pain.  Prior ABIs were normal.   ECG (personally reviewed): NSR, PVC, septal Qs  Labs (9/16): LDL 126, HDL 38 Labs (12/16): K 4, creatinine 1.15 Labs (1/17): K 4.4, creatinine 1.09, LDL 42, HDL 39 Labs (10/01/2015): K 4.5 Creatinine 1.27 Dig level 0.4  Labs (4/17): digoxin 0.4, LFTs normal, TSH normal Labs (5/17): K 4.5, creatinine 1.36 Labs (6/17): K 5, creatinine 1.88, BNP 88, LDL 54, HDl 39, digoxin 0.8, LFTs normal, TSH normal Labs (9/17): K 4.8, creatinine 1.3, digoxin 0.6, TSH normal, LFTs normal Labs (12/17): K 3.2, creatinine 1.33, digoxin 0.3 Labs (7/18): K 4.8, digoxin 0.2, creatinine 1.22 Labs (10/18): LDL 41, HDL 39, digoxin 0.3 Labs (8/24): K 4.8, creatinine 1.56, hgb 13.9 Labs (4/19): digoxin 0.5, K 4.6, creatinine 1.37 Labs (07/23/2018): K 4.6 Creatinine 1.49, hgb 13.8 Labs (5/20): K 4.5, creatinine 1.4, LDL 39, hgb 13.5 Labs (11/20): K 4.5, creatinine 1.05, hgb 13.4 Labs (2/21): K 4.1, creatinine 1.26, LDL 38,  TGs 178 Labs (9/21): LDL 42 Labs (10/21): K 5.2, creatinine 1.14 Labs (12/21): K 4, creatinine 1.04, hgb 15.2  PMH: 1. CAD: s/p MI in 2003.  CABG x 5 in 2003 with LIMA-LAD, SVG-D1, SVG-OM2, seq SVG-PDA/PLV.  LHC/RHC 11/15 with native arteries essentially occluded.  SVG-D1 and seq SVG-PDA/PLV occluded, LIMA and SVG-OM patent.  LHC (1/17) with patent LIMA-LAD, occluded SVG-D, patent SVG-OM => SVG touches down on inferior branch of OM1 that backfills to superior branch of OM1, there are serial 70-80% stenoses in the proximal inferior OM1 branch that  may compromise backflow to superior branch OM1, SVG-PDA/PLV totally occluded, EF 25% . 2. Atrial flutter: DCCV 2015.  Recurred 11/16 with DCCV.   3. Chronic systolic CHF: Ischemic cardiomyopathy.  Echo (2015) with moderate-severe MR, EF 30-35%.  TEE (12/16) with severe central MR (functional), EF 20-25% with diffuse hypokinesis, moderately decreased RV systolic function, moderate TR.  RHC (1/17) with mean RA 5, PA 27/12 mean 18, mean PCWP 10, CI 1.7 Fick/1.83 thermo. EF 25% on 1/17 LV-gram.  Cardiac MRI (2/17) with EF 19%, dilated LV, images not adequate to assess delayed enhancement.  - CPX (3/17) with peak VO2 20.4, VE/VCO2 slope 34, RER 1.11 => mild functional impairment.  - Echo (5/17) with EF 40-45%, trivial MR, mild RV dilation with low normal RV systolic function.  - Echo (5/18) with EF 40-45%, basal to mid inferior HK, moderate LVH, trivial MR.  - Echo (5/19) with EF 55-60%, no significant MR.  - Echo (7/20) with EF 45-50% on my review with mild LV dilation, normal RV size and systolic function, IVC normal, trivial MR.   - Echo (8/21) with EF 45-50% with mild LVH, diffuse hypokinesis, normal RV, no MR.  4. Mitral regurgitation: Severe on 12/16 TEE, but was only trivial on echo in 5/17 and 5/18 with aggressive medical treatment. 5/19 echo with no significant MR. 8/21 echo with no significant MR.  5. OSA: Not using CPAP.  6. HTN 7. Nephrolithiasis 8. Atrial fibrillation/flutter: Noted in 12/16 initially.  Paroxysmal.  Had DCCV to NSR in 3/17.  - Atrial flutter 12/19 => DCCV to NSR, briefly on amiodarone.  - Atrial fibrillation ablation 9/21.  - DCCV to NSR 10/21 9. PFTs (1/17) with minimal obstructive airways disease but severely decreased DLCO (37% predicted), possibly suggestive of pulmonary vascular disease.  10. ABIs (5/17) were normal.  11. CKD 12. Appendectomy 12/17.   Current Outpatient Medications  Medication Sig Dispense Refill  . acetaminophen (TYLENOL) 500 MG tablet Take  500-1,000 mg by mouth every 8 (eight) hours as needed for mild pain.     Marland Kitchen apixaban (ELIQUIS) 5 MG TABS tablet TAKE 1 TABLET(5 MG) BY MOUTH TWICE DAILY 180 tablet 3  . ENTRESTO 97-103 MG TAKE 1 TABLET BY MOUTH TWICE DAILY 60 tablet 11  . furosemide (LASIX) 40 MG tablet Take 1 tablet (40 mg total) by mouth as needed. 30 tablet 11  . isosorbide mononitrate (IMDUR) 30 MG 24 hr tablet TAKE 1 TABLET(30 MG) BY MOUTH DAILY 90 tablet 1  . metoprolol succinate (TOPROL-XL) 50 MG 24 hr tablet Take 1.5 tablets (75 mg total) by mouth 2 (two) times daily. Take with or immediately following a meal. 270 tablet 3  . rosuvastatin (CRESTOR) 40 MG tablet Take 1 tablet (40 mg total) by mouth daily. 90 tablet 3   No current facility-administered medications for this encounter.    Allergies:   Patient has no known allergies.   Social History:  The  patient  reports that he quit smoking about 18 years ago. His smoking use included cigarettes. He has a 40.00 pack-year smoking history. He has never used smokeless tobacco. He reports current alcohol use. He reports that he does not use drugs.   Family History:  The patient's family history includes Cancer in his father and mother.   ROS:  Please see the history of present illness.   All other systems are personally reviewed and negative.   Exam:   BP 110/70   Pulse 61   Wt 103.7 kg (228 lb 9.6 oz)   SpO2 96%   BMI 31.88 kg/m  General: NAD Neck: No JVD, no thyromegaly or thyroid nodule.  Lungs: Clear to auscultation bilaterally with normal respiratory effort. CV: Nondisplaced PMI.  Heart regular S1/S2, no S3/S4, no murmur.  No peripheral edema.  No carotid bruit.  Normal pedal pulses.  Abdomen: Soft, nontender, no hepatosplenomegaly, no distention.  Skin: Intact without lesions or rashes.  Neurologic: Alert and oriented x 3.  Psych: Normal affect. Extremities: No clubbing or cyanosis.  HEENT: Normal.   Recent Labs: 04/17/2020: ALT 26; B Natriuretic Peptide  174.9 11/30/2020: BUN 22; Creatinine, Ser 1.15; Hemoglobin 14.8; Platelets 210; Potassium 4.1; Sodium 138  Personally reviewed   Wt Readings from Last 3 Encounters:  11/30/20 103.7 kg (228 lb 9.6 oz)  08/01/20 103.6 kg (228 lb 6.4 oz)  07/17/20 103.3 kg (227 lb 12.8 oz)      ASSESSMENT AND PLAN:  1. Chronic systolic CHF: EF 28-76% on TEE from 12/16.  cMRI 2/17 with EF 19%, unable to assess for delayed enhancement on this study.  Ischemic cardiomyopathy.  Low cardiac output by RHC in 1/17 but filling pressures optimized.  Despite low output on that RHC, he has been doing quite well with medical treatment.  CPX (3/17) with only mild functional limitation.  Echo in 8/21 with EF 45-50%, normal RV size and systolic function, IVC normal, no significant MR.  He is not volume overloaded on exam, weight stable, NYHA class II symptoms.  - He has not needed Lasix.   - Continue Toprol XL 75 mg bid.   - Continue Entresto 97/103 bid.  BMET today.   - Hold off on spironolactone as K has been mildly elevated in the past.  - Repeat echo at followup in 6 months, if EF any lower, would add SGLT2 inhibitor.    2. CAD: s/p CABG.  On 1/17 cath, only LIMA-LAD and SVG-OM2 still patent. No chest pain.  - No ASA given use of apixaban. - Continue Crestor, check lipids.  3. Atrial fibrillation/flutter: Paroxysmal.  Successful DCCV in 3/17 on amiodarone.  Amiodarone stopped, then he had atrial flutter in 12/19 that was cardioverted back to NSR and amiodarone was restarted transiently.  He had atrial fibrillation ablation in 9/21 and DCCV again in 10/21.  He is now in NSR.  - Continue apixaban, CBC today.  - Rate control reasonable with Toprol XL.  4. Mitral regurgitation: Severe by 12/16 TEE, probably functional.  Much improved on 5/17 echo, only trivial.  Trivial on 5/18 echo. No significant MR on echo 12/2017. Trivial on 7/20 echo. Minimal MR on 8/21 echo.  5. CKD: Stage III. BMET today.    Followup in 6 months with  echo  Signed, Marca Ancona, MD  12/02/2020  [facility] [address] [facility] [place] [address] [phone] (office) [phone] (fax)

## 2020-12-22 ENCOUNTER — Other Ambulatory Visit (HOSPITAL_COMMUNITY): Payer: Self-pay | Admitting: Cardiology

## 2021-01-11 ENCOUNTER — Other Ambulatory Visit (HOSPITAL_COMMUNITY): Payer: Medicare Other

## 2021-01-12 DIAGNOSIS — I639 Cerebral infarction, unspecified: Secondary | ICD-10-CM

## 2021-01-12 HISTORY — DX: Cerebral infarction, unspecified: I63.9

## 2021-01-14 ENCOUNTER — Other Ambulatory Visit: Payer: Self-pay

## 2021-01-14 ENCOUNTER — Emergency Department (HOSPITAL_COMMUNITY)
Admission: EM | Admit: 2021-01-14 | Discharge: 2021-01-14 | Disposition: A | Discharge status: Home / Self Care | Payer: Medicare Other | Source: Home / Self Care

## 2021-01-14 ENCOUNTER — Other Ambulatory Visit (HOSPITAL_COMMUNITY): Payer: Medicare Other

## 2021-01-14 ENCOUNTER — Telehealth (HOSPITAL_COMMUNITY): Payer: Self-pay

## 2021-01-14 DIAGNOSIS — R001 Bradycardia, unspecified: Secondary | ICD-10-CM | POA: Diagnosis not present

## 2021-01-14 DIAGNOSIS — I639 Cerebral infarction, unspecified: Secondary | ICD-10-CM | POA: Diagnosis not present

## 2021-01-14 DIAGNOSIS — R29818 Other symptoms and signs involving the nervous system: Secondary | ICD-10-CM | POA: Diagnosis not present

## 2021-01-14 DIAGNOSIS — Z20822 Contact with and (suspected) exposure to covid-19: Secondary | ICD-10-CM | POA: Diagnosis not present

## 2021-01-14 DIAGNOSIS — G8194 Hemiplegia, unspecified affecting left nondominant side: Secondary | ICD-10-CM | POA: Diagnosis not present

## 2021-01-14 DIAGNOSIS — I6381 Other cerebral infarction due to occlusion or stenosis of small artery: Secondary | ICD-10-CM | POA: Diagnosis not present

## 2021-01-14 DIAGNOSIS — R1032 Left lower quadrant pain: Secondary | ICD-10-CM | POA: Insufficient documentation

## 2021-01-14 DIAGNOSIS — R200 Anesthesia of skin: Secondary | ICD-10-CM | POA: Diagnosis not present

## 2021-01-14 DIAGNOSIS — N179 Acute kidney failure, unspecified: Secondary | ICD-10-CM | POA: Diagnosis not present

## 2021-01-14 DIAGNOSIS — I4819 Other persistent atrial fibrillation: Secondary | ICD-10-CM | POA: Diagnosis not present

## 2021-01-14 DIAGNOSIS — R531 Weakness: Secondary | ICD-10-CM | POA: Insufficient documentation

## 2021-01-14 DIAGNOSIS — Z5321 Procedure and treatment not carried out due to patient leaving prior to being seen by health care provider: Secondary | ICD-10-CM | POA: Insufficient documentation

## 2021-01-14 DIAGNOSIS — Z66 Do not resuscitate: Secondary | ICD-10-CM | POA: Diagnosis not present

## 2021-01-14 LAB — COMPREHENSIVE METABOLIC PANEL
ALT: 23 U/L (ref 0–44)
AST: 26 U/L (ref 15–41)
Albumin: 3.4 g/dL — ABNORMAL LOW (ref 3.5–5.0)
Alkaline Phosphatase: 68 U/L (ref 38–126)
Anion gap: 7 (ref 5–15)
BUN: 35 mg/dL — ABNORMAL HIGH (ref 8–23)
CO2: 24 mmol/L (ref 22–32)
Calcium: 9 mg/dL (ref 8.9–10.3)
Chloride: 106 mmol/L (ref 98–111)
Creatinine, Ser: 1.51 mg/dL — ABNORMAL HIGH (ref 0.61–1.24)
GFR, Estimated: 48 mL/min — ABNORMAL LOW (ref >60)
Glucose, Bld: 106 mg/dL — ABNORMAL HIGH (ref 70–99)
Potassium: 3.5 mmol/L (ref 3.5–5.1)
Sodium: 137 mmol/L (ref 135–145)
Total Bilirubin: 1.1 mg/dL (ref 0.3–1.2)
Total Protein: 6.1 g/dL — ABNORMAL LOW (ref 6.5–8.1)

## 2021-01-14 LAB — CBC WITH DIFFERENTIAL/PLATELET
Abs Immature Granulocytes: 0.08 10*3/uL — ABNORMAL HIGH (ref 0.00–0.07)
Basophils Absolute: 0.1 10*3/uL (ref 0.0–0.1)
Basophils Relative: 1 %
Eosinophils Absolute: 0.2 10*3/uL (ref 0.0–0.5)
Eosinophils Relative: 2 %
HCT: 44.1 % (ref 39.0–52.0)
Hemoglobin: 13.9 g/dL (ref 13.0–17.0)
Immature Granulocytes: 1 %
Lymphocytes Relative: 23 %
Lymphs Abs: 2.4 10*3/uL (ref 0.7–4.0)
MCH: 30.2 pg (ref 26.0–34.0)
MCHC: 31.5 g/dL (ref 30.0–36.0)
MCV: 95.9 fL (ref 80.0–100.0)
Monocytes Absolute: 0.8 10*3/uL (ref 0.1–1.0)
Monocytes Relative: 8 %
Neutro Abs: 6.9 10*3/uL (ref 1.7–7.7)
Neutrophils Relative %: 65 %
Platelets: 232 10*3/uL (ref 150–400)
RBC: 4.6 MIL/uL (ref 4.22–5.81)
RDW: 14.5 % (ref 11.5–15.5)
WBC: 10.4 10*3/uL (ref 4.0–10.5)
nRBC: 0 % (ref 0.0–0.2)

## 2021-01-14 NOTE — Telephone Encounter (Signed)
Patients wife called stating that she thinks that Stanley Wells may have had a possible stroke. Patient did go to the ER but left because he didn't want to wait. I advised patients wife that he needed to go back to the ER.

## 2021-01-14 NOTE — ED Triage Notes (Signed)
Pt reports numbness and weakness to L leg and arm since Saturday night. Sts "I don't have control over it, I have to drag it when I walk." Also reports one episode of LLQ pain on Saturday which has now resolved.

## 2021-01-14 NOTE — ED Provider Notes (Signed)
Emergency Medicine Provider Triage Evaluation Note  Stanley Wells , a 75 y.o. male  was evaluated in triage.  Pt complains of numbness to left lower extremity.  Patient reports that symptoms began on Saturday.  Patient states that he has sensation is unable to move it however when walking he will have episodes where he cannot control his leg.  Patient denies any pain, swelling, rash, erythema, color change to affected limb, fevers, chills.  He denies any recent falls or injuries.  Review of Systems  Positive: Left leg numbness Negative: Facial asymmetry, slurred speech, weakness to extremities,  Physical Exam  BP 120/75 (BP Location: Right Arm)   Pulse 61   Temp 98 F (36.7 C) (Oral)   Resp 18   SpO2 96%  Gen:   Awake, no distress   Resp:  Normal effort  MSK:   Moves extremities without difficulty Other:  Grip strength equally bilaterally, +5 strength to bilateral lower extremities, +5 strength to dorsiflexion and plantar flexion, sensation to light touch intact to bilateral upper and lower extremities,m +3 dorsalis pedis pulse bilaterally, no swelling or tenderness or erythema to bilateral lower extremities  Medical Decision Making  Medically screening exam initiated at 8:06 AM.  Appropriate orders placed.  [name] was informed that the remainder of the evaluation will be completed by another provider, this initial triage assessment does not replace that evaluation, and the importance of remaining in the ED until their evaluation is complete.  The patient appears stable so that the remainder of the work up may be completed by another provider.      Haskel Schroeder, PA-C 01/14/21 0812    Rozelle Logan, DO 01/14/21 [phone]

## 2021-01-14 NOTE — ED Notes (Signed)
Patient decided to leave patient stated that he couldn't wait any longer

## 2021-01-16 ENCOUNTER — Other Ambulatory Visit: Payer: Self-pay

## 2021-01-16 ENCOUNTER — Emergency Department (HOSPITAL_COMMUNITY): Payer: Medicare Other

## 2021-01-16 ENCOUNTER — Encounter (HOSPITAL_COMMUNITY): Payer: Self-pay | Admitting: Pharmacy Technician

## 2021-01-16 ENCOUNTER — Inpatient Hospital Stay (HOSPITAL_COMMUNITY)
Admission: EM | Admit: 2021-01-16 | Discharge: 2021-01-19 | DRG: 065 | Disposition: A | Discharge status: Rehabilitation Facility | Payer: Medicare Other | Attending: Internal Medicine | Admitting: Internal Medicine

## 2021-01-16 DIAGNOSIS — G8194 Hemiplegia, unspecified affecting left nondominant side: Secondary | ICD-10-CM | POA: Diagnosis present

## 2021-01-16 DIAGNOSIS — I639 Cerebral infarction, unspecified: Secondary | ICD-10-CM | POA: Diagnosis not present

## 2021-01-16 DIAGNOSIS — E785 Hyperlipidemia, unspecified: Secondary | ICD-10-CM | POA: Diagnosis not present

## 2021-01-16 DIAGNOSIS — I2510 Atherosclerotic heart disease of native coronary artery without angina pectoris: Secondary | ICD-10-CM | POA: Diagnosis present

## 2021-01-16 DIAGNOSIS — G4733 Obstructive sleep apnea (adult) (pediatric): Secondary | ICD-10-CM | POA: Diagnosis not present

## 2021-01-16 DIAGNOSIS — Z20822 Contact with and (suspected) exposure to covid-19: Secondary | ICD-10-CM | POA: Diagnosis present

## 2021-01-16 DIAGNOSIS — Z7901 Long term (current) use of anticoagulants: Secondary | ICD-10-CM

## 2021-01-16 DIAGNOSIS — I10 Essential (primary) hypertension: Secondary | ICD-10-CM | POA: Diagnosis present

## 2021-01-16 DIAGNOSIS — R7303 Prediabetes: Secondary | ICD-10-CM | POA: Diagnosis present

## 2021-01-16 DIAGNOSIS — Z79899 Other long term (current) drug therapy: Secondary | ICD-10-CM

## 2021-01-16 DIAGNOSIS — Z951 Presence of aortocoronary bypass graft: Secondary | ICD-10-CM | POA: Diagnosis not present

## 2021-01-16 DIAGNOSIS — R262 Difficulty in walking, not elsewhere classified: Secondary | ICD-10-CM | POA: Diagnosis present

## 2021-01-16 DIAGNOSIS — I5042 Chronic combined systolic (congestive) and diastolic (congestive) heart failure: Secondary | ICD-10-CM | POA: Diagnosis not present

## 2021-01-16 DIAGNOSIS — I119 Hypertensive heart disease without heart failure: Secondary | ICD-10-CM | POA: Diagnosis present

## 2021-01-16 DIAGNOSIS — Z87891 Personal history of nicotine dependence: Secondary | ICD-10-CM

## 2021-01-16 DIAGNOSIS — R531 Weakness: Secondary | ICD-10-CM | POA: Diagnosis not present

## 2021-01-16 DIAGNOSIS — R001 Bradycardia, unspecified: Secondary | ICD-10-CM | POA: Diagnosis not present

## 2021-01-16 DIAGNOSIS — I6381 Other cerebral infarction due to occlusion or stenosis of small artery: Principal | ICD-10-CM | POA: Diagnosis present

## 2021-01-16 DIAGNOSIS — E1165 Type 2 diabetes mellitus with hyperglycemia: Secondary | ICD-10-CM | POA: Diagnosis present

## 2021-01-16 DIAGNOSIS — R29818 Other symptoms and signs involving the nervous system: Secondary | ICD-10-CM | POA: Diagnosis not present

## 2021-01-16 DIAGNOSIS — N179 Acute kidney failure, unspecified: Secondary | ICD-10-CM | POA: Diagnosis present

## 2021-01-16 DIAGNOSIS — I110 Hypertensive heart disease with heart failure: Secondary | ICD-10-CM

## 2021-01-16 DIAGNOSIS — I63311 Cerebral infarction due to thrombosis of right middle cerebral artery: Secondary | ICD-10-CM

## 2021-01-16 DIAGNOSIS — I4819 Other persistent atrial fibrillation: Secondary | ICD-10-CM | POA: Diagnosis present

## 2021-01-16 DIAGNOSIS — Z66 Do not resuscitate: Secondary | ICD-10-CM | POA: Diagnosis present

## 2021-01-16 LAB — COMPREHENSIVE METABOLIC PANEL
ALT: 30 U/L (ref 0–44)
AST: 27 U/L (ref 15–41)
Albumin: 3.5 g/dL (ref 3.5–5.0)
Alkaline Phosphatase: 65 U/L (ref 38–126)
Anion gap: 7 (ref 5–15)
BUN: 19 mg/dL (ref 8–23)
CO2: 24 mmol/L (ref 22–32)
Calcium: 8.6 mg/dL — ABNORMAL LOW (ref 8.9–10.3)
Chloride: 109 mmol/L (ref 98–111)
Creatinine, Ser: 1.22 mg/dL (ref 0.61–1.24)
GFR, Estimated: >60 mL/min (ref >60)
Glucose, Bld: 185 mg/dL — ABNORMAL HIGH (ref 70–99)
Potassium: 3.9 mmol/L (ref 3.5–5.1)
Sodium: 140 mmol/L (ref 135–145)
Total Bilirubin: 0.9 mg/dL (ref 0.3–1.2)
Total Protein: 6 g/dL — ABNORMAL LOW (ref 6.5–8.1)

## 2021-01-16 LAB — DIFFERENTIAL
Abs Immature Granulocytes: 0.06 10*3/uL (ref 0.00–0.07)
Basophils Absolute: 0.1 10*3/uL (ref 0.0–0.1)
Basophils Relative: 1 %
Eosinophils Absolute: 0.1 10*3/uL (ref 0.0–0.5)
Eosinophils Relative: 2 %
Immature Granulocytes: 1 %
Lymphocytes Relative: 22 %
Lymphs Abs: 1.6 10*3/uL (ref 0.7–4.0)
Monocytes Absolute: 0.5 10*3/uL (ref 0.1–1.0)
Monocytes Relative: 7 %
Neutro Abs: 4.9 10*3/uL (ref 1.7–7.7)
Neutrophils Relative %: 67 %

## 2021-01-16 LAB — URINALYSIS, ROUTINE W REFLEX MICROSCOPIC
Bilirubin Urine: NEGATIVE
Glucose, UA: 50 mg/dL — AB
Hgb urine dipstick: NEGATIVE
Ketones, ur: NEGATIVE mg/dL
Nitrite: NEGATIVE
Protein, ur: 30 mg/dL — AB
Specific Gravity, Urine: 1.025 (ref 1.005–1.030)
pH: 5 (ref 5.0–8.0)

## 2021-01-16 LAB — RAPID URINE DRUG SCREEN, HOSP PERFORMED
Amphetamines: NOT DETECTED
Barbiturates: NOT DETECTED
Benzodiazepines: NOT DETECTED
Cocaine: NOT DETECTED
Opiates: NOT DETECTED
Tetrahydrocannabinol: NOT DETECTED

## 2021-01-16 LAB — APTT: aPTT: 31 seconds (ref 24–36)

## 2021-01-16 LAB — CBC
HCT: 43.3 % (ref 39.0–52.0)
Hemoglobin: 14.1 g/dL (ref 13.0–17.0)
MCH: 30.8 pg (ref 26.0–34.0)
MCHC: 32.6 g/dL (ref 30.0–36.0)
MCV: 94.5 fL (ref 80.0–100.0)
Platelets: 202 10*3/uL (ref 150–400)
RBC: 4.58 MIL/uL (ref 4.22–5.81)
RDW: 14.2 % (ref 11.5–15.5)
WBC: 7.3 10*3/uL (ref 4.0–10.5)
nRBC: 0 % (ref 0.0–0.2)

## 2021-01-16 LAB — PROTIME-INR
INR: 1.1 (ref 0.8–1.2)
Prothrombin Time: 14.4 seconds (ref 11.4–15.2)

## 2021-01-16 LAB — ETHANOL: Alcohol, Ethyl (B): <10 mg/dL (ref <10)

## 2021-01-16 MED ORDER — LORAZEPAM 1 MG PO TABS: 0.5000 mg | ORAL_TABLET | Freq: Once | ORAL | Status: DC

## 2021-01-16 NOTE — ED Notes (Signed)
The pt returned from mri 

## 2021-01-16 NOTE — ED Notes (Signed)
The pt has had difficulty walking since this weekend when he tripped and fell  He has no pain  Just has difficulty walking.  Alert and oriented x4 moves all extremities

## 2021-01-16 NOTE — ED Provider Notes (Signed)
Emergency Medicine Provider Triage Evaluation Note  Stanley Wells , a 75 y.o. male  was evaluated in triage.  Pt complains of left lower extremity weakness x4 days associated with numbness. No speech or visual changes. No previous CVA. He also notes some weakness in his RLE as well, but left worse than right. No other complaints.   Review of Systems  Positive: weakness Negative: Chest pain  Physical Exam  BP (!) 162/80 (BP Location: Left Arm)   Pulse 60   Temp 98 F (36.7 C) (Oral)   Resp 18   SpO2 95%  Gen:   Awake, no distress   Resp:  Normal effort  MSK:   Moves extremities without difficulty  Other:    Medical Decision Making  Medically screening exam initiated at 1:20 PM.  Appropriate orders placed.  Stanley Wells was informed that the remainder of the evaluation will be completed by another provider, this initial triage assessment does not replace that evaluation, and the importance of remaining in the ED until their evaluation is complete.  Stroke labs ordered. CT head. Out of TPA window given symptoms have been present for 4 days.    Mannie Stabile, PA-C 01/16/21 1322    Arby Barrette, MD 01/21/21 [phone]

## 2021-01-16 NOTE — ED Notes (Signed)
The pt does not need any meds to go to [facility]

## 2021-01-16 NOTE — H&P (Signed)
History and Physical        Hospital Admission Note Date: 01/16/2021  Patient name: Stanley Wells Medical record number: [id number] Date of birth: 1945/09/22 Age: 75 y.o. Gender: male  PCP: [name], MD    Patient coming from: Home   I have reviewed all records in the [facility].    Chief Complaint:  Left Sided Weakness   HPI: Stanley Wells is a 75 y.o. male with PMH of CAD, A. fib status post ablation still on Eliquis, CHF, HLD, HTN, OSA who presents for left arm and leg weakness. Reports this started on Saturday. Last known normal on Friday. Was camping over the weekend in [place] and woke up and immediately fell to the ground. Has been unable to bear weight. Reduced grip strength of left hand. Symptoms have been persistent since initially started without improvement. No slurred speech confusion headache nausea vomiting loss of consciousness vision changes neck pain.He states that he has never had a stroke before.     ED work-up/course:  Patient is a 75 year old male with past medical history detailed in HPI presented today for left leg weakness and left hand weakness that is been ongoing for 5 days now.  He states that his symptoms came on Saturday morning states that his last known normal was Friday evening.   Physical exam is notable for inability to walk with normal gait given that he has inability to move his left leg coordinated fashion and has weakness.  On examination in bed patient is able to lift both legs off the bed and seems to have good coordination with heel-to-shin and finger-to-nose.  Strength is grossly within normal limits symmetric in bilateral grips however he does seem to be unable to hold strength is long and left hand.  CMP notable for mildly elevated blood sugar 185.  CBC unremarkable no leukocytosis or anemia.  Coags within normal limits.  Ethanol negative.  Urinalysis unremarkable  apart from some protein. This will need further evaluation.  Kidney function however is within normal limits.   CT head with age-indeterminate stroke does not seem to correlate with patient's symptoms MRI ordered and discussed with neurology will follow up on MRI.   Patient with right cerebellar stroke.  This does explain patient's symptoms.  Will admit to medicine.  Patient is agreeable to plan.  Review of Systems: Positives marked in 'bold' Constitutional: Denies fever, chills, diaphoresis, poor appetite and fatigue.  HEENT: Denies photophobia, eye pain, redness, hearing loss, ear pain, congestion, sore throat, rhinorrhea, sneezing, mouth sores, trouble swallowing, neck pain, neck stiffness and tinnitus.   Respiratory: Denies SOB, DOE, cough, chest tightness,  and wheezing.   Cardiovascular: Denies chest pain, palpitations and leg swelling.  Gastrointestinal: Denies nausea, vomiting, abdominal pain, diarrhea, constipation, blood in stool and abdominal distention.  Genitourinary: Denies dysuria, urgency, frequency, hematuria, flank pain and difficulty urinating.  Musculoskeletal: Denies myalgias, back pain, joint swelling, arthralgias and gait problem.  Skin: Denies pallor, rash and wound.  Neurological: Denies dizziness, seizures, syncope, weakness, light-headedness, numbness and headaches.  Hematological: Denies adenopathy. Easy bruising, personal or family bleeding history  Psychiatric/Behavioral: Denies suicidal ideation, mood changes, confusion,  nervousness, sleep disturbance and agitation  Past Medical History: Past Medical History:  Diagnosis Date   Atrial flutter with rapid ventricular response (HCC) 07/23/2018   CAD (coronary artery disease)    a. s/p CABG 2003  b. s/p acute MI prior to CABG w/ EF <30%   Cardiomyopathy, ischemic: EF 20-25%    Cholecystitis    Chronic combined systolic and diastolic CHF (congestive heart failure) (HCC)    Chronic systolic CHF (congestive heart  failure) (HCC)    a. 2D ECHO: 05/17/2014: EF 30-35%;  b. 07/2015 TEE: EF 20-25%, diff HK, sev MR, sev dil LA w/o thrombus, mod reduced RV fxn, mod dil RA, mod TR.   Hyperlipidemia    Hypertensive heart disease    Kidney stones X 1   "passed it" (08/02/2015)   OSA (obstructive sleep apnea) 10/11/2014   refuses to use CPAP   Persistent atrial fibrillation (HCC)    a. s/p succesful TEE/DCCV on 06/01/14.  b. on Eliquis;  c. 07/2015 recurrent AF-->Failed DCCV.   S/P CABG x 5 02/16/2002   LIMA to LAD, SVG to D1, SVG to OM2, Sequential SVG to PDA-RPL, saphenous vein harvest from right thigh and lower leg   Severe mitral regurgitation    Functional, improved with improvement in his EF.   Tricuspid regurgitation     Past Surgical History:  Procedure Laterality Date   ATRIAL FIBRILLATION ABLATION N/A 04/11/2020   Procedure: ATRIAL FIBRILLATION ABLATION;  Surgeon: Lanier Prude, MD;  Location: [facility] INVASIVE CV LAB;  Service: Cardiovascular;  Laterality: N/A;   CARDIAC CATHETERIZATION N/A 06/19/2014   Procedure: RIGHT/LEFT HEART CATH AND CORONARY/GRAFT ANGIOGRAPHY;  Surgeon: Lennette Bihari, MD;  Location: [facility] CATH LAB;  Service: Cardiovascular;  Laterality: N/A;   CARDIAC CATHETERIZATION  2003   CARDIAC CATHETERIZATION N/A 08/28/2015   Procedure: Right Heart Cath and Coronary/Graft Angiography;  Surgeon: Laurey Morale, MD;  Location: [facility] INVASIVE CV LAB;  Service: Cardiovascular;  Laterality: N/A;   CARDIOVERSION N/A 06/01/2014   Procedure: CARDIOVERSION;  Surgeon: Lars Masson, MD;  Location: [facility] ENDOSCOPY;  Service: Cardiovascular;  Laterality: N/A;   CARDIOVERSION  06/11/2015; 08/02/2015   CARDIOVERSION N/A 08/02/2015   Procedure: CARDIOVERSION;  Surgeon: Wendall Stade, MD;  Location: [facility] ENDOSCOPY;  Service: Cardiovascular;  Laterality: N/A;   CARDIOVERSION N/A 10/22/2015   Procedure: CARDIOVERSION;  Surgeon: Laurey Morale, MD;  Location: [facility] ENDOSCOPY;  Service: Cardiovascular;  Laterality:  N/A;   CARDIOVERSION N/A 03/15/2020   Procedure: CARDIOVERSION;  Surgeon: Laurey Morale, MD;  Location: [facility] ENDOSCOPY;  Service: Cardiovascular;  Laterality: N/A;   CARDIOVERSION N/A 05/14/2020   Procedure: CARDIOVERSION;  Surgeon: [name], MD;  Location: [facility] ENDOSCOPY;  Service: Cardiovascular;  Laterality: N/A;   CORONARY ARTERY BYPASS GRAFT  02/16/2002   CABG X5   LAPAROSCOPIC APPENDECTOMY N/A 07/20/2016   Procedure: APPENDECTOMY LAPAROSCOPIC;  Surgeon: Manus Rudd, MD;  Location: [facility] OR;  Service: General;  Laterality: N/A;   TEE WITHOUT CARDIOVERSION N/A 06/01/2014   Procedure: TRANSESOPHAGEAL ECHOCARDIOGRAM (TEE);  Surgeon: Lars Masson, MD;  Location: [facility] ENDOSCOPY;  Service: Cardiovascular;  Laterality: N/A;   TEE WITHOUT CARDIOVERSION N/A 08/03/2015   Procedure: TRANSESOPHAGEAL ECHOCARDIOGRAM (TEE);  Surgeon: Lars Masson, MD;  Location: [facility] ENDOSCOPY;  Service: Cardiovascular;  Laterality: N/A;    Medications: Prior to Admission medications   Medication Sig Start Date End Date Taking? Authorizing Provider  acetaminophen (TYLENOL) 500 MG tablet Take 500-1,000 mg by mouth every 8 (eight) hours  as needed for mild pain.     [provider]  apixaban (ELIQUIS) 5 MG TABS tablet TAKE 1 TABLET(5 MG) BY MOUTH TWICE DAILY 08/27/20   Laurey Morale, MD  ENTRESTO 97-103 MG TAKE 1 TABLET BY MOUTH TWICE DAILY 03/26/20   Laurey Morale, MD  furosemide (LASIX) 40 MG tablet Take 1 tablet (40 mg total) by mouth as needed. 04/17/20 04/17/21  Robbie Lis M, PA-C  isosorbide mononitrate (IMDUR) 30 MG 24 hr tablet TAKE 1 TABLET(30 MG) BY MOUTH DAILY 11/22/20   Laurey Morale, MD  metoprolol succinate (TOPROL-XL) 50 MG 24 hr tablet Take 1.5 tablets (75 mg total) by mouth 2 (two) times daily. Take with or immediately following a meal. 08/01/20   Laurey Morale, MD  rosuvastatin (CRESTOR) 40 MG tablet TAKE 1 TABLET(40 MG) BY MOUTH DAILY 12/24/20   Laurey Morale, MD     Allergies:  No Known Allergies  Social History:  reports that he quit smoking about 18 years ago. His smoking use included cigarettes. He has a 40.00 pack-year smoking history. He has never used smokeless tobacco. He reports current alcohol use. He reports that he does not use drugs.  Family History: Family History  Problem Relation Age of Onset   Cancer Mother    Cancer Father    Hypertension Neg Hx    Stroke Neg Hx     Physical Exam: Blood pressure (!) 171/100, pulse (!) 53, temperature 98.1 F (36.7 C), resp. rate (!) 22, SpO2 96 %. General: Alert, awake, oriented x3, in no acute distress. Eyes: pink conjunctiva,anicteric sclera, pupils equal and reactive to light and accomodation, HEENT: normocephalic, atraumatic, oropharynx clear Neck: supple, no masses or lymphadenopathy, no goiter, no bruits, no JVD CVS: Regular rate and rhythm, without murmurs, rubs or gallops. No lower extremity edema Resp : Clear to auscultation bilaterally, no wheezing, rales or rhonchi. GI : Soft, nontender, nondistended, positive bowel sounds, no masses. No hepatomegaly. No hernia.  Musculoskeletal: No clubbing or cyanosis, positive pedal pulses. No contracture. ROM intact  Neuro: Faint weakness on left UE and LE compared to right. CN II-XII intact. Speech normal and clear. Finger to nose testing normal.  Psych: alert and oriented x 3, normal mood and affect Skin: no rashes or lesions, warm and dry   LABS on Admission: I have personally reviewed all the labs and imagings below    Basic Metabolic Panel: Recent Labs  Lab 01/14/21 0813 01/16/21 1320  NA 137 140  K 3.5 3.9  CL 106 109  CO2 24 24  GLUCOSE 106* 185*  BUN 35* 19  CREATININE 1.51* 1.22  CALCIUM 9.0 8.6*   Liver Function Tests: Recent Labs  Lab 01/14/21 0813 01/16/21 1320  AST 26 27  ALT 23 30  ALKPHOS 68 65  BILITOT 1.1 0.9  PROT 6.1* 6.0*  ALBUMIN 3.4* 3.5   No results for input(s): LIPASE, AMYLASE in the last  168 hours. No results for input(s): AMMONIA in the last 168 hours. CBC: Recent Labs  Lab 01/14/21 0813 01/16/21 1320  WBC 10.4 7.3  NEUTROABS 6.9 4.9  HGB 13.9 14.1  HCT 44.1 43.3  MCV 95.9 94.5  PLT 232 202   Cardiac Enzymes: No results for input(s): CKTOTAL, CKMB, CKMBINDEX, TROPONINI in the last 168 hours. BNP: Invalid input(s): POCBNP CBG: No results for input(s): GLUCAP in the last 168 hours.  Radiological Exams on Admission:  CT Head Wo Contrast  Result Date: 01/16/2021 CLINICAL DATA:  Neural deficit. EXAM: CT HEAD WITHOUT CONTRAST TECHNIQUE: Contiguous axial images were obtained from the base of the skull through the vertex without intravenous contrast. COMPARISON:  None. FINDINGS: Brain: No evidence of acute hemorrhage, hydrocephalus, extra-axial collection or mass lesion/mass effect. Advanced deep white matter microangiopathic changes. Moderate brain parenchymal volume loss. Possible age-indeterminate lacunar infarcts in the posterior aspect of the basal ganglia and bilateral thalami. Vascular: No hyperdense vessel or unexpected calcification. Skull: Normal. Negative for fracture or focal lesion. Sinuses/Orbits: No acute finding. Other: None. IMPRESSION: 1. Possible age-indeterminate lacunar infarcts in the posterior aspects of the basal ganglia and bilateral thalami. 2. Advanced deep white matter microangiopathic changes. 3. Moderate brain parenchymal volume loss. Electronically Signed   By: Ted Mcalpineobrinka  Dimitrova M.D.   On: 01/16/2021 14:05   MR BRAIN WO CONTRAST  Result Date: 01/16/2021 CLINICAL DATA:  Acute stroke presentation. Specific symptoms not described. EXAM: MRI HEAD WITHOUT CONTRAST TECHNIQUE: Multiplanar, multiecho pulse sequences of the brain and surrounding structures were obtained without intravenous contrast. COMPARISON:  Head CT earlier same day. FINDINGS: Brain: Diffusion imaging shows a 1 cm acute infarction in the posterior basal ganglia/posterior limb internal  capsule on the right. No other acute infarction. Chronic small-vessel ischemic changes affect pons. Few old small vessel cerebellar infarctions with hemosiderin deposition. Old small vessel infarctions of the thalami. Chronic small-vessel ischemic changes throughout the deep and subcortical white matter. No large vessel territory infarction. Few scattered foci hemosiderin deposition within the deep hemispheres as well. No mass, hydrocephalus or extra-axial collection. Vascular: Major vessels at the base of the brain show flow. Skull and upper cervical spine: Negative Sinuses/Orbits: Clear/normal Other: None IMPRESSION: 1 cm acute infarction at the posterior basal ganglia/posterior limb internal capsule on the right. Extensive chronic small-vessel ischemic changes elsewhere throughout the brain as described above, several showing hemosiderin deposition related to old microhemorrhage. Electronically Signed   By: Paulina FusiMark  Shogry M.D.   On: 01/16/2021 20:51       Assessment/Plan Active Problems:   Chronic combined systolic and diastolic CHF (congestive heart failure) (HCC)   Coronary artery disease involving native coronary artery without angina pectoris   OSA (obstructive sleep apnea)   Essential hypertension   Long term current use of anticoagulant therapy - Eliquis   Persistent atrial fibrillation (HCC)   Hyperlipidemia   Hypertensive heart disease   S/P CABG x 5   CVA (cerebral vascular accident) (HCC)   Left-sided weakness   CVA with Left Sided Weakness MRI of the brain with a lacunar infarction of the posterior limb of the right internal capsule. Likely location suggest small vessel etiology but given his extensive cardiovascular risk factor history-including atrial flutter/atrial fibrillation and systolic congestive heart failure, could be embolic as well. Given deficits for 5 days, outside of window for TPA.  -observation in telemetry -neuro has been consulted, appreciate recs  -CTA  head/neck  -echo  -A1c (noted to have mild hyperglycemia at admit and remote A1c of 6.4 consistent with prediabetes)  -lipid panel  -Lipitor  -continue Eliquis, if concern for embolic would need to transition to other DOAC  -neuro checks  -PT/OT/SLP -NPO pending stroke swallow screen   Hypertensive Heart Disease with h/o CAD and CHF  BP is elevated. Outside of window for permissive HTN. EF 45-50% in Aug 2021 with global hypokinesis.  -continue Entresto, Imdur, Metoprolol  -Lasix listed as prn medication, will need to monitor volume status   Afib  S/p ablation.  -continue Eliquis   OSA -CPAP qhs  DVT prophylaxis: On Eliquis   CODE STATUS: DNR   Consults called: Neuro   Family Communication: Admission, patients condition and plan of care including tests being ordered have been discussed with the patient and patient's wife who indicates understanding and agree with the plan and Code Status  Admission status: Observation   The medical decision making on this patient was of high complexity and the patient is at high risk for clinical deterioration, therefore this is a level 3 admission.  Severity of Illness:      The appropriate patient status for this patient is OBSERVATION. Observation status is judged to be reasonable and necessary in order to provide the required intensity of service to ensure the patient's safety. The patient's presenting symptoms, physical exam findings, and initial radiographic and laboratory data in the context of their medical condition is felt to place them at decreased risk for further clinical deterioration. Furthermore, it is anticipated that the patient will be medically stable for discharge from the hospital within 2 midnights of admission. The following factors support the patient status of observation.   " The patient's presenting symptoms include left sided weakness. " The physical exam findings include left sided weakness. " The initial  radiographic and laboratory data are MRI with CVA.   Time Spent on Admission: 45 min     De Hollingshead D.O.  [facility] 01/16/2021, 10:39 PM

## 2021-01-16 NOTE — Consult Note (Signed)
Neurology Consultation  Reason for Consult: Left-sided weakness Referring Physician: Dr. Norman Clay, EDP  CC: Left-sided weakness  History is obtained from: Patient, chart  HPI: Stanley Wells is a 75 y.o. male past medical history of atrial fibrillation/flutter on Eliquis, coronary artery disease status post CABG in 2003, chronic combined systolic and diastolic heart failure, hypertension, hyperlipidemia, severe mitral regurgitation and tricuspid regurgitation, presented to the emergency room for evaluation of left-sided weakness with last known well on the Friday, 01/11/2021 when he went to bed around 9 PM.  He woke up Saturday morning and was attempting to get out of bed but fell down because he could not support his weight on his left leg.  He was having a hard time falling requiring support to walk even small distances at home.  He waited a few days before coming into the emergency room today for evaluation because of symptoms, did improve some but did not resolve.  The wife also said that he kept dropping a bottle drink from his left hand.  No facial weakness.  No slurred speech.  No headaches.  No chest pain or shortness of breath reported.  No preceding illnesses or sicknesses. No prior history of strokes that the patient knows of. Has had ablation of his atrial flutter 6 months ago and multiple cardioversions. Remains on Eliquis-reports compliance to medication.  LKW: 9 PM on 01/11/2021 tpa given?: no, outside the window, also on Eliquis. Premorbid modified Rankin scale (mRS): 0  ROS: Full ROS was performed and is negative except as noted in the HPI.  Past Medical History:  Diagnosis Date   Atrial flutter with rapid ventricular response (HCC) 07/23/2018   CAD (coronary artery disease)    a. s/p CABG 2003  b. s/p acute MI prior to CABG w/ EF <30%   Cardiomyopathy, ischemic: EF 20-25%    Cholecystitis    Chronic combined systolic and diastolic CHF (congestive heart failure) (HCC)     Chronic systolic CHF (congestive heart failure) (HCC)    a. 2D ECHO: 05/17/2014: EF 30-35%;  b. 07/2015 TEE: EF 20-25%, diff HK, sev MR, sev dil LA w/o thrombus, mod reduced RV fxn, mod dil RA, mod TR.   Hyperlipidemia    Hypertensive heart disease    Kidney stones X 1   "passed it" (08/02/2015)   OSA (obstructive sleep apnea) 10/11/2014   refuses to use CPAP   Persistent atrial fibrillation (HCC)    a. s/p succesful TEE/DCCV on 06/01/14.  b. on Eliquis;  c. 07/2015 recurrent AF-->Failed DCCV.   S/P CABG x 5 02/16/2002   LIMA to LAD, SVG to D1, SVG to OM2, Sequential SVG to PDA-RPL, saphenous vein harvest from right thigh and lower leg   Severe mitral regurgitation    Functional, improved with improvement in his EF.   Tricuspid regurgitation         Family History  Problem Relation Age of Onset   Cancer Mother    Cancer Father    Hypertension Neg Hx    Stroke Neg Hx      Social History:   reports that he quit smoking about 18 years ago. His smoking use included cigarettes. He has a 40.00 pack-year smoking history. He has never used smokeless tobacco. He reports current alcohol use. He reports that he does not use drugs.  Medications  Current Facility-Administered Medications:    LORazepam (ATIVAN) tablet 0.5 mg, 0.5 mg, Oral, Once, [facility], [place]  Current Outpatient Medications:  acetaminophen (TYLENOL) 500 MG tablet, Take 500-1,000 mg by mouth every 8 (eight) hours as needed for mild pain. , Disp: , Rfl:    apixaban (ELIQUIS) 5 MG TABS tablet, TAKE 1 TABLET(5 MG) BY MOUTH TWICE DAILY, Disp: 180 tablet, Rfl: 3   ENTRESTO 97-103 MG, TAKE 1 TABLET BY MOUTH TWICE DAILY, Disp: 60 tablet, Rfl: 11   furosemide (LASIX) 40 MG tablet, Take 1 tablet (40 mg total) by mouth as needed., Disp: 30 tablet, Rfl: 11   isosorbide mononitrate (IMDUR) 30 MG 24 hr tablet, TAKE 1 TABLET(30 MG) BY MOUTH DAILY, Disp: 90 tablet, Rfl: 1   metoprolol succinate (TOPROL-XL) 50 MG 24 hr tablet,  Take 1.5 tablets (75 mg total) by mouth 2 (two) times daily. Take with or immediately following a meal., Disp: 270 tablet, Rfl: 3   rosuvastatin (CRESTOR) 40 MG tablet, TAKE 1 TABLET(40 MG) BY MOUTH DAILY, Disp: 90 tablet, Rfl: 3   Exam: Current vital signs: BP (!) 153/92   Pulse (!) 50   Temp 98.7 F (37.1 C)   Resp 14   SpO2 97%  Vital signs in last 24 hours: Temp:  [98 F (36.7 C)-98.7 F (37.1 C)] 98.7 F (37.1 C) (06/15 1529) Pulse Rate:  [49-60] 50 (06/15 1945) Resp:  [14-19] 14 (06/15 1945) BP: (146-174)/(80-97) 153/92 (06/15 1930) SpO2:  [95 %-100 %] 97 % (06/15 1945) General: Awake alert in no distress HEENT: No Solik Intermatic  Clear to auscultation EXTR cardiovascular: Regular rhythm Abdomen soft nondistended nontender Extremities warm well perfused Neurological exam Awake alert oriented x3 No dysarthria No aphasia Cranial 2-12 intact Motor examination with mild left upper and lower extremity drift.  Right side full strength. Sensation intact to light touch all over Coordination: Positive for dysmetria in left upper and lower extremity disproportionate to the weakness Gait testing deferred. NIHSS 1a Level of Conscious.: 0 1b LOC Questions: 0 1c LOC Commands: 0 2 Best Gaze: 0 3 Visual: 0 4 Facial Palsy: 0 5a Motor Arm - left: 1 5b Motor Arm - Right: 0 6a Motor Leg - Left: 1 6b Motor Leg - Right: 0 7 Limb Ataxia: 2 8 Sensory: 0 9 Best Language: 0 10 Dysarthria: 0 11 Extinct. and Inatten.: 0 TOTAL: 4    Labs I have reviewed labs in epic and the results pertinent to this consultation are:   CBC    Component Value Date/Time   WBC 7.3 01/16/2021 1320   RBC 4.58 01/16/2021 1320   HGB 14.1 01/16/2021 1320   HCT 43.3 01/16/2021 1320   PLT 202 01/16/2021 1320   MCV 94.5 01/16/2021 1320   MCH 30.8 01/16/2021 1320   MCHC 32.6 01/16/2021 1320   RDW 14.2 01/16/2021 1320   LYMPHSABS 1.6 01/16/2021 1320   MONOABS 0.5 01/16/2021 1320   EOSABS 0.1  01/16/2021 1320   BASOSABS 0.1 01/16/2021 1320    CMP     Component Value Date/Time   NA 140 01/16/2021 1320   K 3.9 01/16/2021 1320   CL 109 01/16/2021 1320   CO2 24 01/16/2021 1320   GLUCOSE 185 (H) 01/16/2021 1320   BUN 19 01/16/2021 1320   CREATININE 1.22 01/16/2021 1320   CREATININE 0.90 05/01/2015 0942   CALCIUM 8.6 (L) 01/16/2021 1320   PROT 6.0 (L) 01/16/2021 1320   ALBUMIN 3.5 01/16/2021 1320   AST 27 01/16/2021 1320   ALT 30 01/16/2021 1320   ALKPHOS 65 01/16/2021 1320   BILITOT 0.9 01/16/2021 1320   GFRNONAA >60  01/16/2021 1320   GFRAA >60 04/17/2020 0939    Lipid Panel     Component Value Date/Time   CHOL 121 11/30/2020 0945   TRIG 166 (H) 11/30/2020 0945   HDL 36 (L) 11/30/2020 0945   CHOLHDL 3.4 11/30/2020 0945   VLDL 33 11/30/2020 0945   LDLCALC 52 11/30/2020 0945     Imaging I have reviewed the images obtained: CT of the head with multiple chronic lacunar infarctions bilaterally with age-indeterminate bilateral basal ganglia infarcts. MRI of the brain with 1 cm acute infarction in the posterior limb of the right internal capsule/posterior right basal ganglia.  Extensive chronic small vessel ischemic changes elsewhere throughout the brain with hemosiderin deposition related to old microhemorrhages.   Assessment: 75 year old man with extensive cardiovascular disease history and risk factors presenting for evaluation of difficulty walking, left leg weakness and some left arm weakness with last known well 5 days prior to presentation, with clinical exam suggestive of left hemiparesis and MRI of the brain with a lacunar infarction of the posterior limb of the right internal capsule. Likely location suggest small vessel etiology but given his extensive cardiovascular risk factor history-including atrial flutter/atrial fibrillation and systolic congestive heart failure, could be embolic as well.  Impression:  Lacunar infarct involving the posterior limb of the  right internal capsule Needs full stroke work-up  Recommendations: Last known well is at least 5 days ago-continue with anticoagulation for now. May need changing Eliquis to another DOAC.  Will defer to stroke team rounding in the morning. Telemetry Frequent neurochecks CT angiogram of the head and neck-routine 2D echo A1c Lipid panel Atorvastatin 80 now and daily PT OT Speech therapy N.p.o. until cleared by bedside swallow evaluation Stroke team to follow in the morning Plan discussed with W. Blanchie Dessert PA-c in the ER Plan also discussed with the patient and his wife at bedside-all questions answered.  -- Milon Dikes, MD Neurologist [facility] Pager: [phone]

## 2021-01-16 NOTE — ED Notes (Signed)
The pt reports that he has had  Difficulty with the weakness in his legs for several years now. The pt just refused to get checked

## 2021-01-16 NOTE — ED Notes (Signed)
The pt passed his swallow screen ?

## 2021-01-16 NOTE — ED Triage Notes (Signed)
Pt here with L sided arm and leg weakness since Saturday. Pt with no other neuro deficits noted.

## 2021-01-16 NOTE — ED Notes (Signed)
2 unsuccessful sticks for an iv

## 2021-01-16 NOTE — ED Provider Notes (Signed)
[facility] EMERGENCY DEPARTMENT Provider Note   CSN: [id number] Arrival date & time: 01/16/21  1238     History Chief Complaint  Patient presents with   Stroke Symptoms    WALDEN STATZ is a 75 y.o. male.  HPI Patient is 76 year old male with past medical history significant for CAD, A. fib status post ablation still on Eliquis, CHF, HLD, HTN, OSA  Patient here with left-sided arm and leg weakness that began over the weekend.  He states that it began on Saturday when he was in [place] camping in an RV.  He states that he woke up Saturday morning and felt immediately to the ground when he was getting out of bed because his left leg felt like it was not there.  He states that he still has a persistent feeling that his leg is not there.  He does feel sensation when it is touched however.  He states that he can move it well but that it has almost no strength.  He has been unable to walk without support since Saturday.  He states his symptoms been persistent with no waxing or waning.  He states he also has weakness in his left hand has been dropping things out of his left hand.  No slurred speech confusion headache nausea vomiting loss of consciousness vision changes neck pain.  He states that he has never had a stroke before.  Denies any other associated symptoms.  No aggravating or mitigating factors.     Past Medical History:  Diagnosis Date   Atrial flutter with rapid ventricular response (HCC) 07/23/2018   CAD (coronary artery disease)    a. s/p CABG 2003  b. s/p acute MI prior to CABG w/ EF <30%   Cardiomyopathy, ischemic: EF 20-25%    Cholecystitis    Chronic combined systolic and diastolic CHF (congestive heart failure) (HCC)    Chronic systolic CHF (congestive heart failure) (HCC)    a. 2D ECHO: 05/17/2014: EF 30-35%;  b. 07/2015 TEE: EF 20-25%, diff HK, sev MR, sev dil LA w/o thrombus, mod reduced RV fxn, mod dil RA, mod TR.   Hyperlipidemia     Hypertensive heart disease    Kidney stones X 1   "passed it" (08/02/2015)   OSA (obstructive sleep apnea) 10/11/2014   refuses to use CPAP   Persistent atrial fibrillation (HCC)    a. s/p succesful TEE/DCCV on 06/01/14.  b. on Eliquis;  c. 07/2015 recurrent AF-->Failed DCCV.   S/P CABG x 5 02/16/2002   LIMA to LAD, SVG to D1, SVG to OM2, Sequential SVG to PDA-RPL, saphenous vein harvest from right thigh and lower leg   Severe mitral regurgitation    Functional, improved with improvement in his EF.   Tricuspid regurgitation     Patient Active Problem List   Diagnosis Date Noted   Atrial flutter with rapid ventricular response (HCC) 07/23/2018   Ileus, postoperative (HCC)    Acute appendicitis with localized peritonitis    Chronic combined systolic and diastolic congestive heart failure (HCC)    Acute appendicitis 07/19/2016   Tricuspid regurgitation    Severe mitral regurgitation 08/04/2015   Hyperlipidemia    Hypertensive heart disease    Atrial fibrillation (HCC) 08/02/2015   A-fib (HCC) 08/02/2015   Persistent atrial fibrillation (HCC) 08/02/2015   Chronic anticoagulation - Eliquis, CHADS2VASC=4 07/23/2015   Acute on chronic combined systolic and diastolic heart failure, NYHA class 2 (HCC) 07/13/2015   Acute on chronic combined systolic  and diastolic congestive heart failure, NYHA class 1 (HCC) 07/13/2015   Mitral regurgitation 07/07/2015   Elevated troponin 07/07/2015   Acute kidney injury (HCC) 07/07/2015   Hypotension 07/07/2015   Leukocytosis 07/07/2015   Noncompliance with medication regimen 07/05/2015   Long term current use of anticoagulant therapy - Eliquis 06/11/2015   Essential hypertension 05/01/2015   OSA (obstructive sleep apnea) 10/11/2014   Cardiomyopathy, ischemic: EF 20-25% 07/06/2014   Chronic combined systolic and diastolic CHF (congestive heart failure) (HCC)    Coronary artery disease involving native coronary artery without angina pectoris     Cholecystitis    S/P CABG x 5 02/16/2002    Past Surgical History:  Procedure Laterality Date   ATRIAL FIBRILLATION ABLATION N/A 04/11/2020   Procedure: ATRIAL FIBRILLATION ABLATION;  Surgeon: Lanier Prude, MD;  Location: [facility] INVASIVE CV LAB;  Service: Cardiovascular;  Laterality: N/A;   CARDIAC CATHETERIZATION N/A 06/19/2014   Procedure: RIGHT/LEFT HEART CATH AND CORONARY/GRAFT ANGIOGRAPHY;  Surgeon: Lennette Bihari, MD;  Location: [facility] CATH LAB;  Service: Cardiovascular;  Laterality: N/A;   CARDIAC CATHETERIZATION  2003   CARDIAC CATHETERIZATION N/A 08/28/2015   Procedure: Right Heart Cath and Coronary/Graft Angiography;  Surgeon: Laurey Morale, MD;  Location: [facility] INVASIVE CV LAB;  Service: Cardiovascular;  Laterality: N/A;   CARDIOVERSION N/A 06/01/2014   Procedure: CARDIOVERSION;  Surgeon: Lars Masson, MD;  Location: [facility] ENDOSCOPY;  Service: Cardiovascular;  Laterality: N/A;   CARDIOVERSION  06/11/2015; 08/02/2015   CARDIOVERSION N/A 08/02/2015   Procedure: CARDIOVERSION;  Surgeon: Wendall Stade, MD;  Location: [facility] ENDOSCOPY;  Service: Cardiovascular;  Laterality: N/A;   CARDIOVERSION N/A 10/22/2015   Procedure: CARDIOVERSION;  Surgeon: Laurey Morale, MD;  Location: [facility] ENDOSCOPY;  Service: Cardiovascular;  Laterality: N/A;   CARDIOVERSION N/A 03/15/2020   Procedure: CARDIOVERSION;  Surgeon: Laurey Morale, MD;  Location: [facility] ENDOSCOPY;  Service: Cardiovascular;  Laterality: N/A;   CARDIOVERSION N/A 05/14/2020   Procedure: CARDIOVERSION;  Surgeon: [name], MD;  Location: [facility] ENDOSCOPY;  Service: Cardiovascular;  Laterality: N/A;   CORONARY ARTERY BYPASS GRAFT  02/16/2002   CABG X5   LAPAROSCOPIC APPENDECTOMY N/A 07/20/2016   Procedure: APPENDECTOMY LAPAROSCOPIC;  Surgeon: Manus Rudd, MD;  Location: [facility] OR;  Service: General;  Laterality: N/A;   TEE WITHOUT CARDIOVERSION N/A 06/01/2014   Procedure: TRANSESOPHAGEAL ECHOCARDIOGRAM (TEE);  Surgeon: Lars Masson, MD;   Location: [facility] ENDOSCOPY;  Service: Cardiovascular;  Laterality: N/A;   TEE WITHOUT CARDIOVERSION N/A 08/03/2015   Procedure: TRANSESOPHAGEAL ECHOCARDIOGRAM (TEE);  Surgeon: Lars Masson, MD;  Location: [facility] ENDOSCOPY;  Service: Cardiovascular;  Laterality: N/A;       Family History  Problem Relation Age of Onset   Cancer Mother    Cancer Father    Hypertension Neg Hx    Stroke Neg Hx     Social History   Tobacco Use   Smoking status: Former    Packs/day: 1.00    Years: 40.00    Pack years: 40.00    Types: Cigarettes    Quit date: 02/02/2002    Years since quitting: 18.9   Smokeless tobacco: Never  Substance Use Topics   Alcohol use: Yes    Comment: 08/02/2015 "I rarely have a beer"   Drug use: No    Home Medications Prior to Admission medications   Medication Sig Start Date End Date Taking? Authorizing Provider  acetaminophen (TYLENOL) 500 MG tablet Take 500-1,000 mg by mouth every 8 (eight) hours as  needed for mild pain.     [provider]  apixaban (ELIQUIS) 5 MG TABS tablet TAKE 1 TABLET(5 MG) BY MOUTH TWICE DAILY 08/27/20   Laurey Morale, MD  ENTRESTO 97-103 MG TAKE 1 TABLET BY MOUTH TWICE DAILY 03/26/20   Laurey Morale, MD  furosemide (LASIX) 40 MG tablet Take 1 tablet (40 mg total) by mouth as needed. 04/17/20 04/17/21  Robbie Lis M, PA-C  isosorbide mononitrate (IMDUR) 30 MG 24 hr tablet TAKE 1 TABLET(30 MG) BY MOUTH DAILY 11/22/20   Laurey Morale, MD  metoprolol succinate (TOPROL-XL) 50 MG 24 hr tablet Take 1.5 tablets (75 mg total) by mouth 2 (two) times daily. Take with or immediately following a meal. 08/01/20   Laurey Morale, MD  rosuvastatin (CRESTOR) 40 MG tablet TAKE 1 TABLET(40 MG) BY MOUTH DAILY 12/24/20   Laurey Morale, MD    Allergies    Patient has no known allergies.  Review of Systems   Review of Systems  Constitutional:  Negative for chills and fever.  HENT:  Negative for congestion.   Eyes:  Negative for pain.   Respiratory:  Negative for cough and shortness of breath.   Cardiovascular:  Negative for chest pain and leg swelling.  Gastrointestinal:  Negative for abdominal pain and vomiting.  Genitourinary:  Negative for dysuria.  Musculoskeletal:  Negative for myalgias.  Skin:  Negative for rash.  Neurological:  Positive for weakness. Negative for dizziness and headaches.   Physical Exam Updated Vital Signs BP (!) 158/92   Pulse (!) 49   Temp 98.7 F (37.1 C)   Resp 18   SpO2 96%   Physical Exam Vitals and nursing note reviewed.  Constitutional:      General: He is not in acute distress. HENT:     Head: Normocephalic and atraumatic.     Nose: Nose normal.  Eyes:     General: No scleral icterus. Cardiovascular:     Rate and Rhythm: Normal rate and regular rhythm.     Pulses: Normal pulses.     Heart sounds: Normal heart sounds.  Pulmonary:     Effort: Pulmonary effort is normal. No respiratory distress.     Breath sounds: No wheezing.  Abdominal:     Palpations: Abdomen is soft.     Tenderness: There is no abdominal tenderness.  Musculoskeletal:     Cervical back: Normal range of motion.     Right lower leg: No edema.     Left lower leg: No edema.  Skin:    General: Skin is warm and dry.     Capillary Refill: Capillary refill takes less than 2 seconds.  Neurological:     Mental Status: He is alert. Mental status is at baseline.     Comments: Faintly perceptible weakness in both left upper and left lower extremity Unable to ambulate because of weakness in left leg. finger-to-nose and heel-to-shin intact.  Alert and oriented to self, place, time and event.   Speech is fluent, clear without dysarthria or dysphasia.   Strength 5/5 in upper/lower extremities   Sensation intact in upper/lower extremities   Gait exam terminated upon standing and nearing falling (prevented with my support)  CN I not tested  CN II grossly intact visual fields bilaterally. Did not visualize  posterior eye.  CN III, IV, VI PERRLA and EOMs intact bilaterally  CN V Intact sensation to sharp and light touch to the face  CN VII facial movements symmetric  CN VIII not tested  CN IX, X no uvula deviation, symmetric rise of soft palate  CN XI 5/5 SCM and trapezius strength bilaterally  CN XII Midline tongue protrusion, symmetric L/R movements     Psychiatric:        Mood and Affect: Mood normal.        Behavior: Behavior normal.    ED Results / Procedures / Treatments   Labs (all labs ordered are listed, but only abnormal results are displayed) Labs Reviewed  COMPREHENSIVE METABOLIC PANEL - Abnormal; Notable for the following components:      Result Value   Glucose, Bld 185 (*)    Calcium 8.6 (*)    Total Protein 6.0 (*)    All other components within normal limits  URINALYSIS, ROUTINE W REFLEX MICROSCOPIC - Abnormal; Notable for the following components:   APPearance HAZY (*)    Glucose, UA 50 (*)    Protein, ur 30 (*)    Leukocytes,Ua SMALL (*)    Bacteria, UA RARE (*)    All other components within normal limits  ETHANOL  PROTIME-INR  APTT  CBC  DIFFERENTIAL  RAPID URINE DRUG SCREEN, HOSP PERFORMED    EKG None  Radiology CT Head Wo Contrast  Result Date: 01/16/2021 CLINICAL DATA:  Neural deficit. EXAM: CT HEAD WITHOUT CONTRAST TECHNIQUE: Contiguous axial images were obtained from the base of the skull through the vertex without intravenous contrast. COMPARISON:  None. FINDINGS: Brain: No evidence of acute hemorrhage, hydrocephalus, extra-axial collection or mass lesion/mass effect. Advanced deep white matter microangiopathic changes. Moderate brain parenchymal volume loss. Possible age-indeterminate lacunar infarcts in the posterior aspect of the basal ganglia and bilateral thalami. Vascular: No hyperdense vessel or unexpected calcification. Skull: Normal. Negative for fracture or focal lesion. Sinuses/Orbits: No acute finding. Other: None. IMPRESSION: 1.  Possible age-indeterminate lacunar infarcts in the posterior aspects of the basal ganglia and bilateral thalami. 2. Advanced deep white matter microangiopathic changes. 3. Moderate brain parenchymal volume loss. Electronically Signed   By: Ted Mcalpineobrinka  Dimitrova M.D.   On: 01/16/2021 14:05    Procedures .Critical Care  Date/Time: 01/16/2021 9:13 PM Performed by: Gailen ShelterFondaw, Keveon Amsler S, PA Authorized by: Gailen ShelterFondaw, Maksym Pfiffner S, PA   Critical care provider statement:    Critical care time (minutes):  35   Critical care time was exclusive of:  Separately billable procedures and treating other patients and teaching time   Critical care was necessary to treat or prevent imminent or life-threatening deterioration of the following conditions: stroke.   Critical care was time spent personally by me on the following activities:  Discussions with consultants, evaluation of patient's response to treatment, examination of patient, review of old charts, re-evaluation of patient's condition, pulse oximetry, ordering and review of radiographic studies, ordering and review of laboratory studies and ordering and performing treatments and interventions   I assumed direction of critical care for this patient from another provider in my specialty: no     Medications Ordered in ED Medications  LORazepam (ATIVAN) tablet 0.5 mg (has no administration in time range)    ED Course  I have reviewed the triage vital signs and the nursing notes.  Pertinent labs & imaging results that were available during my care of the patient were reviewed by me and considered in my medical decision making (see chart for details).  Clinical Course as of 01/16/21 2112  Wed Jan 16, 2021  1810 . [WF]  1940 Discussed with Dr. Wilford CornerArora of neurology.  [WF]  2045 Called by Dr. Wilford Corner who states acute stroke in right hemisphere.  Will admit to medicine.  [WF]    Clinical Course User Index [WF] Gailen Shelter, [place]   MDM Rules/Calculators/A&P                           Patient is a 75 year old male with past medical history detailed in HPI presented today for left leg weakness and left hand weakness that is been ongoing for 5 days now.  He states that his symptoms came on Saturday morning states that his last known normal was Friday evening.  Physical exam is notable for inability to walk with normal gait given that he has inability to move his left leg coordinated fashion and has weakness.  On examination in bed patient is able to lift both legs off the bed and seems to have good coordination with heel-to-shin and finger-to-nose.  Strength is grossly within normal limits symmetric in bilateral grips however he does seem to be unable to hold strength is long and left hand.  CMP notable for mildly elevated blood sugar 185.  CBC unremarkable no leukocytosis or anemia.  Coags within normal limits.  Ethanol negative.  Urinalysis unremarkable apart from some protein. This will need further evaluation.  Kidney function however is within normal limits.  CT head with age-indeterminate stroke does not seem to correlate with patient's symptoms MRI ordered and discussed with neurology will follow up on MRI.  Patient with right cerebellar stroke.  This does explain patient's symptoms.  Will admit to medicine.  Patient is agreeable to plan.  Discussed with hospitalist who will admit.  Fredrico Beedle Colter was evaluated in Emergency Department on 01/16/2021 for the symptoms described in the history of present illness. He was evaluated in the context of the global COVID-19 pandemic, which necessitated consideration that the patient might be at risk for infection with the SARS-CoV-2 virus that causes COVID-19. Institutional protocols and algorithms that pertain to the evaluation of patients at risk for COVID-19 are in a state of rapid change based on information released by regulatory bodies including the CDC and federal and state organizations. These policies and  algorithms were followed during the patient's care in the ED.   Final Clinical Impression(s) / ED Diagnoses Final diagnoses:  Weakness  Cerebral infarction, unspecified mechanism [facility])    Rx / DC Orders ED Discharge Orders     None        Gailen Shelter, [place] 01/16/21 2128    Cheryll Cockayne, MD 01/18/21 [phone]

## 2021-01-16 NOTE — ED Notes (Signed)
The pt  Is going to [facility]

## 2021-01-17 ENCOUNTER — Observation Stay (HOSPITAL_COMMUNITY): Payer: Medicare Other

## 2021-01-17 DIAGNOSIS — I63311 Cerebral infarction due to thrombosis of right middle cerebral artery: Secondary | ICD-10-CM

## 2021-01-17 DIAGNOSIS — I110 Hypertensive heart disease with heart failure: Secondary | ICD-10-CM | POA: Diagnosis present

## 2021-01-17 DIAGNOSIS — G8194 Hemiplegia, unspecified affecting left nondominant side: Secondary | ICD-10-CM | POA: Diagnosis present

## 2021-01-17 DIAGNOSIS — Z87891 Personal history of nicotine dependence: Secondary | ICD-10-CM | POA: Diagnosis not present

## 2021-01-17 DIAGNOSIS — I2510 Atherosclerotic heart disease of native coronary artery without angina pectoris: Secondary | ICD-10-CM | POA: Diagnosis not present

## 2021-01-17 DIAGNOSIS — R001 Bradycardia, unspecified: Secondary | ICD-10-CM | POA: Diagnosis not present

## 2021-01-17 DIAGNOSIS — R262 Difficulty in walking, not elsewhere classified: Secondary | ICD-10-CM | POA: Diagnosis present

## 2021-01-17 DIAGNOSIS — E785 Hyperlipidemia, unspecified: Secondary | ICD-10-CM | POA: Diagnosis present

## 2021-01-17 DIAGNOSIS — Z20822 Contact with and (suspected) exposure to covid-19: Secondary | ICD-10-CM | POA: Diagnosis present

## 2021-01-17 DIAGNOSIS — Z79899 Other long term (current) drug therapy: Secondary | ICD-10-CM | POA: Diagnosis not present

## 2021-01-17 DIAGNOSIS — I4819 Other persistent atrial fibrillation: Secondary | ICD-10-CM | POA: Diagnosis present

## 2021-01-17 DIAGNOSIS — I69328 Other speech and language deficits following cerebral infarction: Secondary | ICD-10-CM | POA: Diagnosis not present

## 2021-01-17 DIAGNOSIS — N179 Acute kidney failure, unspecified: Secondary | ICD-10-CM | POA: Diagnosis present

## 2021-01-17 DIAGNOSIS — I6381 Other cerebral infarction due to occlusion or stenosis of small artery: Secondary | ICD-10-CM | POA: Diagnosis present

## 2021-01-17 DIAGNOSIS — Z7901 Long term (current) use of anticoagulants: Secondary | ICD-10-CM | POA: Diagnosis not present

## 2021-01-17 DIAGNOSIS — I5042 Chronic combined systolic (congestive) and diastolic (congestive) heart failure: Secondary | ICD-10-CM | POA: Diagnosis present

## 2021-01-17 DIAGNOSIS — I69311 Memory deficit following cerebral infarction: Secondary | ICD-10-CM | POA: Diagnosis not present

## 2021-01-17 DIAGNOSIS — E119 Type 2 diabetes mellitus without complications: Secondary | ICD-10-CM | POA: Diagnosis not present

## 2021-01-17 DIAGNOSIS — I69354 Hemiplegia and hemiparesis following cerebral infarction affecting left non-dominant side: Secondary | ICD-10-CM | POA: Diagnosis not present

## 2021-01-17 DIAGNOSIS — R531 Weakness: Secondary | ICD-10-CM | POA: Diagnosis not present

## 2021-01-17 DIAGNOSIS — I639 Cerebral infarction, unspecified: Secondary | ICD-10-CM | POA: Diagnosis not present

## 2021-01-17 DIAGNOSIS — I69318 Other symptoms and signs involving cognitive functions following cerebral infarction: Secondary | ICD-10-CM | POA: Diagnosis not present

## 2021-01-17 DIAGNOSIS — I10 Essential (primary) hypertension: Secondary | ICD-10-CM | POA: Diagnosis not present

## 2021-01-17 DIAGNOSIS — Z66 Do not resuscitate: Secondary | ICD-10-CM | POA: Diagnosis present

## 2021-01-17 DIAGNOSIS — I6523 Occlusion and stenosis of bilateral carotid arteries: Secondary | ICD-10-CM | POA: Diagnosis not present

## 2021-01-17 DIAGNOSIS — E1165 Type 2 diabetes mellitus with hyperglycemia: Secondary | ICD-10-CM | POA: Diagnosis present

## 2021-01-17 DIAGNOSIS — R7303 Prediabetes: Secondary | ICD-10-CM | POA: Diagnosis present

## 2021-01-17 DIAGNOSIS — Z951 Presence of aortocoronary bypass graft: Secondary | ICD-10-CM | POA: Diagnosis not present

## 2021-01-17 DIAGNOSIS — I6389 Other cerebral infarction: Secondary | ICD-10-CM | POA: Diagnosis not present

## 2021-01-17 DIAGNOSIS — G4733 Obstructive sleep apnea (adult) (pediatric): Secondary | ICD-10-CM | POA: Diagnosis not present

## 2021-01-17 LAB — LIPID PANEL
Cholesterol: 107 mg/dL (ref 0–200)
HDL: 33 mg/dL — ABNORMAL LOW (ref >40)
LDL Cholesterol: 41 mg/dL (ref 0–99)
Total CHOL/HDL Ratio: 3.2 RATIO
Triglycerides: 167 mg/dL — ABNORMAL HIGH (ref <150)
VLDL: 33 mg/dL (ref 0–40)

## 2021-01-17 LAB — SARS CORONAVIRUS 2 (TAT 6-24 HRS): SARS Coronavirus 2: NEGATIVE

## 2021-01-17 MED ORDER — ATORVASTATIN CALCIUM 80 MG PO TABS: 80.0000 mg | ORAL_TABLET | Freq: Every day | ORAL | Status: DC

## 2021-01-17 MED ORDER — ACETAMINOPHEN 325 MG PO TABS: 650.0000 mg | ORAL_TABLET | ORAL | Status: DC | PRN

## 2021-01-17 MED ORDER — SENNOSIDES-DOCUSATE SODIUM 8.6-50 MG PO TABS: 1.0000 | ORAL_TABLET | Freq: Every evening | ORAL | Status: DC | PRN

## 2021-01-17 MED ORDER — APIXABAN 5 MG PO TABS
5.0000 mg | ORAL_TABLET | Freq: Two times a day (BID) | ORAL | Status: DC
  Administered 2021-01-17 – 2021-01-19 (×4): 5 mg via ORAL
  Filled 2021-01-17 (×4): qty 1

## 2021-01-17 MED ORDER — ACETAMINOPHEN 160 MG/5ML PO SOLN: 650.0000 mg | ORAL | Status: DC | PRN

## 2021-01-17 MED ORDER — SACUBITRIL-VALSARTAN 97-103 MG PO TABS
1.0000 | ORAL_TABLET | Freq: Two times a day (BID) | ORAL | Status: DC
  Administered 2021-01-17 – 2021-01-19 (×5): 1 via ORAL
  Filled 2021-01-17 (×6): qty 1

## 2021-01-17 MED ORDER — STROKE: EARLY STAGES OF RECOVERY BOOK: Freq: Once | Status: DC

## 2021-01-17 MED ORDER — METOPROLOL SUCCINATE ER 50 MG PO TB24
75.0000 mg | ORAL_TABLET | Freq: Two times a day (BID) | ORAL | Status: DC
  Administered 2021-01-17 – 2021-01-19 (×4): 75 mg via ORAL
  Filled 2021-01-17 (×5): qty 1

## 2021-01-17 MED ORDER — IOHEXOL 350 MG/ML SOLN
50.0000 mL | Freq: Once | INTRAVENOUS | Status: AC | PRN
  Administered 2021-01-17: 50 mL via INTRAVENOUS

## 2021-01-17 MED ORDER — ROSUVASTATIN CALCIUM 20 MG PO TABS
40.0000 mg | ORAL_TABLET | Freq: Every day | ORAL | Status: DC
  Administered 2021-01-17 – 2021-01-18 (×2): 40 mg via ORAL
  Filled 2021-01-17 (×3): qty 2

## 2021-01-17 MED ORDER — ACETAMINOPHEN 650 MG RE SUPP: 650.0000 mg | RECTAL | Status: DC | PRN

## 2021-01-17 MED ORDER — SODIUM CHLORIDE 0.9 % IV SOLN: INTRAVENOUS | Status: DC

## 2021-01-17 MED ORDER — ASPIRIN EC 81 MG PO TBEC
81.0000 mg | DELAYED_RELEASE_TABLET | Freq: Every day | ORAL | Status: DC
  Administered 2021-01-17 – 2021-01-19 (×3): 81 mg via ORAL
  Filled 2021-01-17 (×3): qty 1

## 2021-01-17 MED ORDER — ISOSORBIDE MONONITRATE ER 30 MG PO TB24
30.0000 mg | ORAL_TABLET | Freq: Every day | ORAL | Status: DC
  Administered 2021-01-17 – 2021-01-19 (×3): 30 mg via ORAL
  Filled 2021-01-17 (×3): qty 1

## 2021-01-17 NOTE — Evaluation (Signed)
Occupational Therapy Evaluation Patient Details Name: Stanley [name]: [id number] DOB: 01-Mar-1946 Today's Date: 01/17/2021    History of Present Illness Pt is a 75 y/o male admitted 6/15 secondary to LUE and LLE weakness and decreased coordination. Found to have infarct in posterior basal ganglia/posterior limb  internal capsule on the right. PMH includes CAD s/p CABG, HTN, a fib, and CHF.   Clinical Impression   Pt admitted with above. He demonstrates the below listed deficits and will benefit from continued OT to maximize safety and independence with BADLs.  Pt presents to OT with Lt sided ataxia/dysmetria, impaired balance, decreased safety awareness, decreased activity tolerance. He currently requires set up assist for UB ADLs in seated position, and mod A for LB ADLs.  He requires up to mod A +2 for functional mobility as he fatigues.  He lives with his wife at home and reports he was fully independent Prior to stroke.  Recommend CIR.  Reviewed BEFAST.      Follow Up Recommendations  CIR    Equipment Recommendations       Recommendations for Other Services Rehab consult     Precautions / Restrictions Precautions Precautions: Fall Precaution Comments: Had fall on saturday when he first had symptoms Restrictions Weight Bearing Restrictions: No      Mobility Bed Mobility Overal bed mobility: Needs Assistance Bed Mobility: Supine to Sit;Sit to Supine     Supine to sit: Min assist Sit to supine: Min assist   General bed mobility comments: Required assist for trunk and LE assist.    Transfers Overall transfer level: Needs assistance Equipment used: 2 person hand held assist Transfers: Sit to/from Stand Sit to Stand: Min assist;+2 physical assistance         General transfer comment: Min A + 2 for lift assist    Balance Overall balance assessment: Needs assistance Sitting-balance support: No upper extremity supported;Feet supported Sitting balance-Leahy  Scale: Fair     Standing balance support: Bilateral upper extremity supported;During functional activity Standing balance-Leahy Scale: Poor Standing balance comment: Reliant on BUE support                           ADL either performed or assessed with clinical judgement   ADL Overall ADL's : Needs assistance/impaired Eating/Feeding: Modified independent;Sitting;Bed level   Grooming: Wash/dry hands;Oral care;Wash/dry face;Brushing hair;Minimal assistance;Standing   Upper Body Bathing: Set up;Sitting   Lower Body Bathing: Moderate assistance;Sit to/from stand   Upper Body Dressing : Set up;Supervision/safety;Sitting   Lower Body Dressing: Moderate assistance;Sit to/from stand   Toilet Transfer: Moderate assistance;Ambulation;Comfort height toilet;RW   Toileting- Clothing Manipulation and Hygiene: Moderate assistance;Sit to/from stand       Functional mobility during ADLs: Moderate assistance;+2 for safety/equipment;+2 for physical assistance       Vision Baseline Vision/History: No visual deficits Patient Visual Report: No change from baseline Vision Assessment?: Yes Eye Alignment: Within Functional Limits Ocular Range of Motion: Within Functional Limits Alignment/Gaze Preference: Within Defined Limits Tracking/Visual Pursuits: Other (comment) Convergence: Within functional limits Additional Comments: mild horizontal nystagmus noted with Lt gaze     Perception Perception Perception Tested?: Yes   Praxis Praxis Praxis tested?: Within functional limits    Pertinent Vitals/Pain Pain Assessment: No/denies pain     Hand Dominance Right   Extremity/Trunk Assessment Upper Extremity Assessment Upper Extremity Assessment: LUE deficits/detail LUE Deficits / Details: mild dysmetria   Lower Extremity Assessment Lower Extremity Assessment: LLE deficits/detail LLE Deficits /  Details: Decreased coordnation and functional weakness noted with increased  distance. LLE Coordination: decreased gross motor   Cervical / Trunk Assessment Cervical / Trunk Assessment: Kyphotic   Communication Communication Communication: No difficulties   Cognition Arousal/Alertness: Awake/alert Behavior During Therapy: WFL for tasks assessed/performed Overall Cognitive Status: No family/caregiver present to determine baseline cognitive functioning                                 General Comments: Low knowledge base for healthcare issues. Likely close to baseline.  He demontrates poor awareness of deficits and impaired problem solving   General Comments  VSS    Exercises     Shoulder Instructions      Home Living Family/patient expects to be discharged to:: Private residence Living Arrangements: Spouse/significant other;Parent Available Help at Discharge: Family Type of Home: House Home Access: Stairs to enter Secretary/administrator of Steps: 4 Entrance Stairs-Rails: Right Home Layout: One level     Bathroom Shower/Tub: Producer, television/film/video: Standard     Home Equipment: None          Prior Functioning/Environment Level of Independence: Independent        Comments: Still driving.  Has been moving minimally since onset of symptoms        OT Problem List: Decreased strength;Decreased activity tolerance;Impaired balance (sitting and/or standing);Decreased coordination;Decreased safety awareness;Decreased cognition;Decreased knowledge of use of DME or AE;Impaired UE functional use      OT Treatment/Interventions: Self-care/ADL training;Therapeutic exercise;Neuromuscular education;DME and/or AE instruction;Therapeutic activities;Cognitive remediation/compensation;Visual/perceptual remediation/compensation;Patient/family education;Balance training    OT Goals(Current goals can be found in the care plan section) Acute Rehab OT Goals Patient Stated Goal: to be independent OT Goal Formulation: With patient Time  For Goal Achievement: 01/31/21 Potential to Achieve Goals: Good ADL Goals Pt Will Perform Grooming: with min guard assist;standing Pt Will Perform Lower Body Bathing: with min guard assist;sit to/from stand Pt Will Perform Lower Body Dressing: with min guard assist;sit to/from stand Pt Will Transfer to Toilet: with min guard assist;ambulating;bedside commode;grab bars Pt Will Perform Toileting - Clothing Manipulation and hygiene: with min guard assist;sit to/from stand Additional ADL Goal #1: Pt will be able to alternate attention while performing familiar ADL tasks  OT Frequency: Min 2X/week   Barriers to D/C:            Co-evaluation PT/OT/SLP Co-Evaluation/Treatment: Yes Reason for Co-Treatment: For patient/therapist safety;To address functional/ADL transfers PT goals addressed during session: Mobility/safety with mobility;Balance OT goals addressed during session: ADL's and self-care      AM-PAC OT "6 Clicks" Daily Activity     Outcome Measure Help from another person eating meals?: None Help from another person taking care of personal grooming?: A Little Help from another person toileting, which includes using toliet, bedpan, or urinal?: A Lot Help from another person bathing (including washing, rinsing, drying)?: A Little Help from another person to put on and taking off regular upper body clothing?: A Little Help from another person to put on and taking off regular lower body clothing?: A Lot 6 Click Score: 17   End of Session Equipment Utilized During Treatment: Gait belt Nurse Communication: Mobility status  Activity Tolerance: Patient tolerated treatment well Patient left: in bed;with call bell/phone within reach  OT Visit Diagnosis: Unsteadiness on feet (R26.81);Ataxia, unspecified (R27.0)                Time: 1000-1027 OT Time Calculation (  min): 27 min Charges:  OT General Charges $OT Visit: 1 Visit OT Evaluation $OT Eval Moderate Complexity: 1 Mod  Eber Jones., OTR/L Acute Rehabilitation Services Pager [phone] Office [phone]   Jeani Hawking M 01/17/2021, 12:01 PM

## 2021-01-17 NOTE — ED Notes (Signed)
Food and warm blanket offered  The pt does not want either

## 2021-01-17 NOTE — Progress Notes (Signed)
STROKE TEAM PROGRESS NOTE   SUBJECTIVE (INTERVAL HISTORY) His wife is at the bedside.  Overall his condition is gradually improving. Pt stated that he had symptoms almost a week ago but he was out of state for a camping trip at [place]. He did not seek for medical attention right away. Currently he felt better than before but still has left leg weakness. He said he is compliant with eliquis.    OBJECTIVE Temp:  [98.1 F (36.7 C)-98.7 F (37.1 C)] 98.6 F (37 C) (06/16 0810) Pulse Rate:  [41-109] 56 (06/16 1200) Resp:  [13-26] 24 (06/16 1200) BP: (116-182)/(75-106) 145/87 (06/16 1200) SpO2:  [94 %-100 %] 96 % (06/16 1200)  No results for input(s): GLUCAP in the last 168 hours. Recent Labs  Lab 01/14/21 0813 01/16/21 1320  NA 137 140  K 3.5 3.9  CL 106 109  CO2 24 24  GLUCOSE 106* 185*  BUN 35* 19  CREATININE 1.51* 1.22  CALCIUM 9.0 8.6*   Recent Labs  Lab 01/14/21 0813 01/16/21 1320  AST 26 27  ALT 23 30  ALKPHOS 68 65  BILITOT 1.1 0.9  PROT 6.1* 6.0*  ALBUMIN 3.4* 3.5   Recent Labs  Lab 01/14/21 0813 01/16/21 1320  WBC 10.4 7.3  NEUTROABS 6.9 4.9  HGB 13.9 14.1  HCT 44.1 43.3  MCV 95.9 94.5  PLT 232 202   No results for input(s): CKTOTAL, CKMB, CKMBINDEX, TROPONINI in the last 168 hours. Recent Labs    01/16/21 1320  LABPROT 14.4  INR 1.1   Recent Labs    01/16/21 1320  COLORURINE YELLOW  LABSPEC 1.025  PHURINE 5.0  GLUCOSEU 50*  HGBUR NEGATIVE  BILIRUBINUR NEGATIVE  KETONESUR NEGATIVE  PROTEINUR 30*  NITRITE NEGATIVE  LEUKOCYTESUR SMALL*       Component Value Date/Time   CHOL 107 01/17/2021 0344   TRIG 167 (H) 01/17/2021 0344   HDL 33 (L) 01/17/2021 0344   CHOLHDL 3.2 01/17/2021 0344   VLDL 33 01/17/2021 0344   LDLCALC 41 01/17/2021 0344   Lab Results  Component Value Date   HGBA1C 6.4 (H) 05/16/2014      Component Value Date/Time   LABOPIA NONE DETECTED 01/16/2021 1320   COCAINSCRNUR NONE DETECTED 01/16/2021 1320   LABBENZ NONE  DETECTED 01/16/2021 1320   AMPHETMU NONE DETECTED 01/16/2021 1320   THCU NONE DETECTED 01/16/2021 1320   LABBARB NONE DETECTED 01/16/2021 1320    Recent Labs  Lab 01/16/21 1320  ETH <10    I have personally reviewed the radiological images below and agree with the radiology interpretations.  CT ANGIO HEAD W OR WO CONTRAST  Result Date: 01/17/2021 CLINICAL DATA:  Left upper and lower extremity weakness EXAM: CT ANGIOGRAPHY HEAD AND NECK TECHNIQUE: Multidetector CT imaging of the head and neck was performed using the standard protocol during bolus administration of intravenous contrast. Multiplanar CT image reconstructions and MIPs were obtained to evaluate the vascular anatomy. Carotid stenosis measurements (when applicable) are obtained utilizing NASCET criteria, using the distal internal carotid diameter as the denominator. CONTRAST:  62mL OMNIPAQUE IOHEXOL 350 MG/ML SOLN COMPARISON:  None. FINDINGS: CTA NECK FINDINGS SKELETON: There is no bony spinal canal stenosis. No lytic or blastic lesion. OTHER NECK: Normal pharynx, larynx and major salivary glands. No cervical lymphadenopathy. Unremarkable thyroid gland. UPPER CHEST: No pneumothorax or pleural effusion. No nodules or masses. AORTIC ARCH: There is no calcific atherosclerosis of the aortic arch. There is no aneurysm, dissection or hemodynamically significant stenosis of  the visualized portion of the aorta. Conventional 3 vessel aortic branching pattern. The visualized proximal subclavian arteries are widely patent. RIGHT CAROTID SYSTEM: No dissection, occlusion or aneurysm. There is mixed density atherosclerosis extending into the proximal ICA, resulting in less than 50% stenosis. LEFT CAROTID SYSTEM: No dissection, occlusion or aneurysm. There is mixed density atherosclerosis extending into the proximal ICA, resulting in less than 50% stenosis. VERTEBRAL ARTERIES: Left dominant configuration. Both origins are clearly patent. There is no  dissection, occlusion or flow-limiting stenosis to the skull base (V1-V3 segments). CTA HEAD FINDINGS POSTERIOR CIRCULATION: --Vertebral arteries: Normal V4 segments. --Inferior cerebellar arteries: Normal. --Basilar artery: Normal. --Superior cerebellar arteries: Normal. --Posterior cerebral arteries (PCA): Normal. ANTERIOR CIRCULATION: --Intracranial internal carotid arteries: Normal. --Anterior cerebral arteries (ACA): Normal. Both A1 segments are present. Patent anterior communicating artery (a-comm). --Middle cerebral arteries (MCA): Normal. VENOUS SINUSES: As permitted by contrast timing, patent. ANATOMIC VARIANTS: None Review of the MIP images confirms the above findings. IMPRESSION: 1. No intracranial arterial occlusion or high-grade stenosis. 2. Bilateral carotid bifurcation atherosclerosis without hemodynamically significant stenosis by NASCET criteria. Electronically Signed   By: Deatra RobinsonKevin  Herman M.D.   On: 01/17/2021 01:02   CT Head Wo Contrast  Result Date: 01/16/2021 CLINICAL DATA:  Neural deficit. EXAM: CT HEAD WITHOUT CONTRAST TECHNIQUE: Contiguous axial images were obtained from the base of the skull through the vertex without intravenous contrast. COMPARISON:  None. FINDINGS: Brain: No evidence of acute hemorrhage, hydrocephalus, extra-axial collection or mass lesion/mass effect. Advanced deep white matter microangiopathic changes. Moderate brain parenchymal volume loss. Possible age-indeterminate lacunar infarcts in the posterior aspect of the basal ganglia and bilateral thalami. Vascular: No hyperdense vessel or unexpected calcification. Skull: Normal. Negative for fracture or focal lesion. Sinuses/Orbits: No acute finding. Other: None. IMPRESSION: 1. Possible age-indeterminate lacunar infarcts in the posterior aspects of the basal ganglia and bilateral thalami. 2. Advanced deep white matter microangiopathic changes. 3. Moderate brain parenchymal volume loss. Electronically Signed   By: Ted Mcalpineobrinka   Dimitrova M.D.   On: 01/16/2021 14:05   CT ANGIO NECK W OR WO CONTRAST  Result Date: 01/17/2021 CLINICAL DATA:  Left upper and lower extremity weakness EXAM: CT ANGIOGRAPHY HEAD AND NECK TECHNIQUE: Multidetector CT imaging of the head and neck was performed using the standard protocol during bolus administration of intravenous contrast. Multiplanar CT image reconstructions and MIPs were obtained to evaluate the vascular anatomy. Carotid stenosis measurements (when applicable) are obtained utilizing NASCET criteria, using the distal internal carotid diameter as the denominator. CONTRAST:  50mL OMNIPAQUE IOHEXOL 350 MG/ML SOLN COMPARISON:  None. FINDINGS: CTA NECK FINDINGS SKELETON: There is no bony spinal canal stenosis. No lytic or blastic lesion. OTHER NECK: Normal pharynx, larynx and major salivary glands. No cervical lymphadenopathy. Unremarkable thyroid gland. UPPER CHEST: No pneumothorax or pleural effusion. No nodules or masses. AORTIC ARCH: There is no calcific atherosclerosis of the aortic arch. There is no aneurysm, dissection or hemodynamically significant stenosis of the visualized portion of the aorta. Conventional 3 vessel aortic branching pattern. The visualized proximal subclavian arteries are widely patent. RIGHT CAROTID SYSTEM: No dissection, occlusion or aneurysm. There is mixed density atherosclerosis extending into the proximal ICA, resulting in less than 50% stenosis. LEFT CAROTID SYSTEM: No dissection, occlusion or aneurysm. There is mixed density atherosclerosis extending into the proximal ICA, resulting in less than 50% stenosis. VERTEBRAL ARTERIES: Left dominant configuration. Both origins are clearly patent. There is no dissection, occlusion or flow-limiting stenosis to the skull base (V1-V3 segments). CTA HEAD FINDINGS POSTERIOR  CIRCULATION: --Vertebral arteries: Normal V4 segments. --Inferior cerebellar arteries: Normal. --Basilar artery: Normal. --Superior cerebellar arteries:  Normal. --Posterior cerebral arteries (PCA): Normal. ANTERIOR CIRCULATION: --Intracranial internal carotid arteries: Normal. --Anterior cerebral arteries (ACA): Normal. Both A1 segments are present. Patent anterior communicating artery (a-comm). --Middle cerebral arteries (MCA): Normal. VENOUS SINUSES: As permitted by contrast timing, patent. ANATOMIC VARIANTS: None Review of the MIP images confirms the above findings. IMPRESSION: 1. No intracranial arterial occlusion or high-grade stenosis. 2. Bilateral carotid bifurcation atherosclerosis without hemodynamically significant stenosis by NASCET criteria. Electronically Signed   By: Deatra Robinson M.D.   On: 01/17/2021 01:02   MR BRAIN WO CONTRAST  Result Date: 01/16/2021 CLINICAL DATA:  Acute stroke presentation. Specific symptoms not described. EXAM: MRI HEAD WITHOUT CONTRAST TECHNIQUE: Multiplanar, multiecho pulse sequences of the brain and surrounding structures were obtained without intravenous contrast. COMPARISON:  Head CT earlier same day. FINDINGS: Brain: Diffusion imaging shows a 1 cm acute infarction in the posterior basal ganglia/posterior limb internal capsule on the right. No other acute infarction. Chronic small-vessel ischemic changes affect pons. Few old small vessel cerebellar infarctions with hemosiderin deposition. Old small vessel infarctions of the thalami. Chronic small-vessel ischemic changes throughout the deep and subcortical white matter. No large vessel territory infarction. Few scattered foci hemosiderin deposition within the deep hemispheres as well. No mass, hydrocephalus or extra-axial collection. Vascular: Major vessels at the base of the brain show flow. Skull and upper cervical spine: Negative Sinuses/Orbits: Clear/normal Other: None IMPRESSION: 1 cm acute infarction at the posterior basal ganglia/posterior limb internal capsule on the right. Extensive chronic small-vessel ischemic changes elsewhere throughout the brain as  described above, several showing hemosiderin deposition related to old microhemorrhage. Electronically Signed   By: Paulina Fusi M.D.   On: 01/16/2021 20:51     PHYSICAL EXAM  Temp:  [98.1 F (36.7 C)-98.7 F (37.1 C)] 98.6 F (37 C) (06/16 0810) Pulse Rate:  [41-109] 56 (06/16 1200) Resp:  [13-26] 24 (06/16 1200) BP: (116-182)/(75-106) 145/87 (06/16 1200) SpO2:  [94 %-100 %] 96 % (06/16 1200)  General - Obese, well developed, in no apparent distress.  Ophthalmologic - fundi not visualized due to noncooperation.  Cardiovascular - irregularly irregular heart rate and rhythm, but still can see p wave on the tele.  Mental Status -  Level of arousal and orientation to time, place, and person were intact. Language including expression, naming, repetition, comprehension was assessed and found intact. Fund of Knowledge was assessed and was intact.  Cranial Nerves II - XII - II - Visual field intact OU. III, IV, VI - Extraocular movements intact. V - Facial sensation intact bilaterally. VII - Facial movement intact bilaterally. VIII - Hearing & vestibular intact bilaterally. X - Palate elevates symmetrically. XI - Chin turning & shoulder shrug intact bilaterally. XII - Tongue protrusion intact.  Motor Strength - The patient's strength was normal in all extremities and pronator drift was absent except left LE proximal and distal 4+/5.  Bulk was normal and fasciculations were absent.   Motor Tone - Muscle tone was assessed at the neck and appendages and was normal.  Reflexes - The patient's reflexes were symmetrical in all extremities and he had no pathological reflexes.  Sensory - Light touch, temperature/pinprick were assessed and were symmetrical.    Coordination - The patient had normal movements in the hands with no ataxia or dysmetria.  Tremor was absent.  Gait and Station - deferred.   ASSESSMENT/PLAN Mr. WEAVER TWEED is a 75  y.o. male with history of afib on  eliquis, CAD s/p CABG 2003, CHF, HTN and HLD admitted for left sided weakness for 6 days. No tPA given due to outside window.    Stroke:  right BG/IC infarct secondary to small vessel disease source Resultant mild left LE weakness CT head possible right BG infarct CTA head and neck - no LVO, athero at b/l bulb and siphon regions MRI  R BG/IC infarct 2D Echo  pending  LDL 41 HgbA1c pending UDS neg eliquis for VTE prophylaxis Eliquis (apixaban) daily prior to admission, now on Eliquis (apixaban) daily. Will add ASA 81 on top of eliquis for stroke prevention  Patient counseled to be compliant with his antithrombotic medications Ongoing aggressive stroke risk factor management Therapy recommendations:  CIR Disposition:  pending   Afib s/p ablation and cardioversion On eliquis PTA, compliant Given small size of stroke, will resume eliquis Continue eliquis on discharge.  Hypertension Stable Long term BP goal normotensive   Hyperlipidemia Home meds:  crestor 40  LDL 41, goal < 70 Now resumed crestor 40 Continue statin at discharge  Other Stroke Risk Factors Advanced age Obesity Coronary artery disease s/p CABG in 2003  Other Active Problems Chronic b/l LE weakness AKI Cre 1.22 UA WBC 11-20  Hospital day # 0  Neurology will sign off. Please call with questions. Pt will follow up with stroke clinic NP at [facility] in about 4 weeks. Thanks for the consult.   Marvel Plan, MD PhD Stroke Neurology 01/17/2021 1:12 PM    To contact Stroke Continuity provider, please refer to [email or web address]. After hours, contact General Neurology

## 2021-01-17 NOTE — ED Notes (Signed)
Breakfast tray ordered for pt

## 2021-01-17 NOTE — Progress Notes (Signed)
Inpatient Rehab Admissions Coordinator Note:   Per therapy recommendations, pt was screened for CIR candidacy by Estill Dooms, PT, DPT.  At this time we are recommending a CIR consult and I will place an order per protocol.  Please contact me with questions.   Estill Dooms, PT, DPT [phone] 01/17/21 1:13 PM

## 2021-01-17 NOTE — Progress Notes (Signed)
PROGRESS NOTE    Stanley Wells  ZOX:[id number] DOB: February 20, 1946 DOA: 01/16/2021 PCP: [name], MD     Brief Narrative:  Stanley Wells is a 75 y.o. male with PMH of CAD, A. fib status post ablation still on Eliquis, CHF, HLD, HTN, OSA who presents for left arm and leg weakness. Reports this started on Saturday. Last known normal on Friday. Was camping over the weekend in [place] and woke up and immediately fell to the ground. Has been unable to bear weight. Reduced grip strength of left hand. Symptoms have been persistent since initially started without improvement. No slurred speech confusion headache nausea vomiting loss of consciousness vision changes neck pain.He states that he has never had a stroke before. MRI of the brain with a lacunar infarction of the posterior limb of the right internal capsule. Neurology consulted.   New events last 24 hours / Subjective: Patient feeling well, has no complaints on examination.  He states that his weakness has improved.  Denies any recent illnesses, chest pain, shortness of breath, cough, nausea, vomiting, diarrhea.  Assessment & Plan:   Active Problems:   Chronic combined systolic and diastolic CHF (congestive heart failure) (HCC)   Coronary artery disease involving native coronary artery without angina pectoris   OSA (obstructive sleep apnea)   Essential hypertension   Long term current use of anticoagulant therapy - Eliquis   Persistent atrial fibrillation (HCC)   Hyperlipidemia   Hypertensive heart disease   S/P CABG x 5   CVA (cerebral vascular accident) (HCC)   Left-sided weakness   Acute CVA -Stroke team following, follow-up in stroke clinic in 4 weeks  -Echocardiogram pending -Continue Eliquis, aspirin, Crestor -PT OT recommending CIR evaluation  Essential hypertension -Outside of window for permissive hypertension -Continue Entresto, Imdur, metoprolol  Chronic systolic and diastolic heart failure -Does not appear  to be overtly fluid overloaded at this time -Continue Entresto, Imdur, metoprolol.  Lasix is taken on as-needed basis at baseline  Chronic A. fib -Continue Eliquis  OSA -Continue CPAP nightly  AKI -Resolved   DVT prophylaxis:   apixaban (ELIQUIS) tablet 5 mg  Code Status:     Code Status Orders  (From admission, onward)           Start     Ordered   01/17/21 0022  Do not attempt resuscitation (DNR)  Continuous       Question Answer Comment  In the event of cardiac or respiratory ARREST Do not call a "code blue"   In the event of cardiac or respiratory ARREST Do not perform Intubation, CPR, defibrillation or ACLS   In the event of cardiac or respiratory ARREST Use medication by any route, position, wound care, and other measures to relive pain and suffering. May use oxygen, suction and manual treatment of airway obstruction as needed for comfort.      01/17/21 0021           Code Status History     Date Active Date Inactive Code Status Order ID Comments User Context   04/11/2020 1628 04/12/2020 0056 Full Code [id number]  Lanier Prude, MD Inpatient   07/19/2016 1247 07/25/2016 1720 Full Code [id number]  Valentino Nose, MD Inpatient   08/28/2015 1159 08/28/2015 1859 Full Code [id number]  Laurey Morale, MD Inpatient   08/02/2015 1526 08/04/2015 1553 Full Code [id number]  Marily Lente, NP Inpatient   07/13/2015 1723 07/15/2015 1417 Full Code [id number]  Dwana Melena, PA-C Inpatient  07/05/2015 1359 07/07/2015 1727 Full Code [id number]  Azalee Course, [place] Inpatient   06/19/2014 1122 06/19/2014 1817 Full Code [id number]  Lennette Bihari, MD Inpatient   05/16/2014 1634 05/18/2014 1713 Full Code [id number]  Barrett, Shawn [facility]      Family Communication: None at bedside Disposition Plan:  Status is: Observation  The patient will require care spanning > 2 midnights and should be moved to inpatient because: Ongoing diagnostic testing needed not appropriate for  outpatient work up  Dispo: The patient is from: Home              Anticipated d/c is to: CIR              Patient currently is not medically stable to d/c.   Difficult to place patient No      Consultants:  Neurology   Procedures:  None   Antimicrobials:  Anti-infectives (From admission, onward)    None        Objective: Vitals:   01/17/21 0810 01/17/21 1100 01/17/21 1200 01/17/21 1423  BP:  (!) 150/88 (!) 145/87   Pulse:  68 (!) 56   Resp:  (!) 22 (!) 24   Temp: 98.6 F (37 C)   98.6 F (37 C)  TempSrc: Oral   Oral  SpO2:  97% 96%    No intake or output data in the 24 hours ending 01/17/21 1439 There were no vitals filed for this visit.  Examination:  General exam: Appears calm and comfortable  Respiratory system: Clear to auscultation. Respiratory effort normal. No respiratory distress. No conversational dyspnea.  Cardiovascular system: S1 & S2 heard, Irreg rhythm. No murmurs. No pedal edema. Gastrointestinal system: Abdomen is nondistended, soft and nontender. Normal bowel sounds heard. Central nervous system: Alert and oriented. No focal neurological deficits. Speech clear.  Extremities: Symmetric in appearance  Skin: No rashes, lesions or ulcers on exposed skin  Psychiatry: Judgement and insight appear normal. Mood & affect appropriate.   Data Reviewed: I have personally reviewed following labs and imaging studies  CBC: Recent Labs  Lab 01/14/21 0813 01/16/21 1320  WBC 10.4 7.3  NEUTROABS 6.9 4.9  HGB 13.9 14.1  HCT 44.1 43.3  MCV 95.9 94.5  PLT 232 202   Basic Metabolic Panel: Recent Labs  Lab 01/14/21 0813 01/16/21 1320  NA 137 140  K 3.5 3.9  CL 106 109  CO2 24 24  GLUCOSE 106* 185*  BUN 35* 19  CREATININE 1.51* 1.22  CALCIUM 9.0 8.6*   GFR: CrCl cannot be calculated (Unknown ideal weight.). Liver Function Tests: Recent Labs  Lab 01/14/21 0813 01/16/21 1320  AST 26 27  ALT 23 30  ALKPHOS 68 65  BILITOT 1.1 0.9  PROT  6.1* 6.0*  ALBUMIN 3.4* 3.5   No results for input(s): LIPASE, AMYLASE in the last 168 hours. No results for input(s): AMMONIA in the last 168 hours. Coagulation Profile: Recent Labs  Lab 01/16/21 1320  INR 1.1   Cardiac Enzymes: No results for input(s): CKTOTAL, CKMB, CKMBINDEX, TROPONINI in the last 168 hours. BNP (last 3 results) No results for input(s): PROBNP in the last 8760 hours. HbA1C: No results for input(s): HGBA1C in the last 72 hours. CBG: No results for input(s): GLUCAP in the last 168 hours. Lipid Profile: Recent Labs    01/17/21 0344  CHOL 107  HDL 33*  LDLCALC 41  TRIG 324*  CHOLHDL 3.2   Thyroid Function Tests: No results for input(s): TSH, T4TOTAL,  FREET4, T3FREE, THYROIDAB in the last 72 hours. Anemia Panel: No results for input(s): VITAMINB12, FOLATE, FERRITIN, TIBC, IRON, RETICCTPCT in the last 72 hours. Sepsis Labs: No results for input(s): PROCALCITON, LATICACIDVEN in the last 168 hours.  Recent Results (from the past 240 hour(s))  SARS CORONAVIRUS 2 (TAT 6-24 HRS) Nasopharyngeal Nasopharyngeal Swab     Status: None   Collection Time: 01/17/21  8:25 AM   Specimen: Nasopharyngeal Swab  Result Value Ref Range Status   SARS Coronavirus 2 NEGATIVE NEGATIVE Final    Comment: (NOTE) SARS-CoV-2 target nucleic acids are NOT DETECTED.  The SARS-CoV-2 RNA is generally detectable in upper and lower respiratory specimens during the acute phase of infection. Negative results do not preclude SARS-CoV-2 infection, do not rule out co-infections with other pathogens, and should not be used as the sole basis for treatment or other patient management decisions. Negative results must be combined with clinical observations, patient history, and epidemiological information. The expected result is Negative.  Fact Sheet for Patients: HairSlick.nohttps://www.fda.gov/media/138098/download  Fact Sheet for Healthcare  Providers: quierodirigir.comhttps://www.fda.gov/media/138095/download  This test is not yet approved or cleared by the [place] FDA and  has been authorized for detection and/or diagnosis of SARS-CoV-2 by FDA under an Emergency Use Authorization (EUA). This EUA will remain  in effect (meaning this test can be used) for the duration of the COVID-19 declaration under Se ction 564(b)(1) of the Act, 21 U.S.C. section 360bbb-3(b)(1), unless the authorization is terminated or revoked sooner.  Performed at [facility], [address] [place], [place] [address]       Radiology Studies: CT ANGIO HEAD W OR WO CONTRAST  Result Date: 01/17/2021 CLINICAL DATA:  Left upper and lower extremity weakness EXAM: CT ANGIOGRAPHY HEAD AND NECK TECHNIQUE: Multidetector CT imaging of the head and neck was performed using the standard protocol during bolus administration of intravenous contrast. Multiplanar CT image reconstructions and MIPs were obtained to evaluate the vascular anatomy. Carotid stenosis measurements (when applicable) are obtained utilizing NASCET criteria, using the distal internal carotid diameter as the denominator. CONTRAST:  50mL OMNIPAQUE IOHEXOL 350 MG/ML SOLN COMPARISON:  None. FINDINGS: CTA NECK FINDINGS SKELETON: There is no bony spinal canal stenosis. No lytic or blastic lesion. OTHER NECK: Normal pharynx, larynx and major salivary glands. No cervical lymphadenopathy. Unremarkable thyroid gland. UPPER CHEST: No pneumothorax or pleural effusion. No nodules or masses. AORTIC ARCH: There is no calcific atherosclerosis of the aortic arch. There is no aneurysm, dissection or hemodynamically significant stenosis of the visualized portion of the aorta. Conventional 3 vessel aortic branching pattern. The visualized proximal subclavian arteries are widely patent. RIGHT CAROTID SYSTEM: No dissection, occlusion or aneurysm. There is mixed density atherosclerosis extending into the proximal ICA, resulting in  less than 50% stenosis. LEFT CAROTID SYSTEM: No dissection, occlusion or aneurysm. There is mixed density atherosclerosis extending into the proximal ICA, resulting in less than 50% stenosis. VERTEBRAL ARTERIES: Left dominant configuration. Both origins are clearly patent. There is no dissection, occlusion or flow-limiting stenosis to the skull base (V1-V3 segments). CTA HEAD FINDINGS POSTERIOR CIRCULATION: --Vertebral arteries: Normal V4 segments. --Inferior cerebellar arteries: Normal. --Basilar artery: Normal. --Superior cerebellar arteries: Normal. --Posterior cerebral arteries (PCA): Normal. ANTERIOR CIRCULATION: --Intracranial internal carotid arteries: Normal. --Anterior cerebral arteries (ACA): Normal. Both A1 segments are present. Patent anterior communicating artery (a-comm). --Middle cerebral arteries (MCA): Normal. VENOUS SINUSES: As permitted by contrast timing, patent. ANATOMIC VARIANTS: None Review of the MIP images confirms the above findings. IMPRESSION: 1. No  intracranial arterial occlusion or high-grade stenosis. 2. Bilateral carotid bifurcation atherosclerosis without hemodynamically significant stenosis by NASCET criteria. Electronically Signed   By: Deatra Robinson M.D.   On: 01/17/2021 01:02   CT Head Wo Contrast  Result Date: 01/16/2021 CLINICAL DATA:  Neural deficit. EXAM: CT HEAD WITHOUT CONTRAST TECHNIQUE: Contiguous axial images were obtained from the base of the skull through the vertex without intravenous contrast. COMPARISON:  None. FINDINGS: Brain: No evidence of acute hemorrhage, hydrocephalus, extra-axial collection or mass lesion/mass effect. Advanced deep white matter microangiopathic changes. Moderate brain parenchymal volume loss. Possible age-indeterminate lacunar infarcts in the posterior aspect of the basal ganglia and bilateral thalami. Vascular: No hyperdense vessel or unexpected calcification. Skull: Normal. Negative for fracture or focal lesion. Sinuses/Orbits: No acute  finding. Other: None. IMPRESSION: 1. Possible age-indeterminate lacunar infarcts in the posterior aspects of the basal ganglia and bilateral thalami. 2. Advanced deep white matter microangiopathic changes. 3. Moderate brain parenchymal volume loss. Electronically Signed   By: Ted Mcalpine M.D.   On: 01/16/2021 14:05   CT ANGIO NECK W OR WO CONTRAST  Result Date: 01/17/2021 CLINICAL DATA:  Left upper and lower extremity weakness EXAM: CT ANGIOGRAPHY HEAD AND NECK TECHNIQUE: Multidetector CT imaging of the head and neck was performed using the standard protocol during bolus administration of intravenous contrast. Multiplanar CT image reconstructions and MIPs were obtained to evaluate the vascular anatomy. Carotid stenosis measurements (when applicable) are obtained utilizing NASCET criteria, using the distal internal carotid diameter as the denominator. CONTRAST:  50mL OMNIPAQUE IOHEXOL 350 MG/ML SOLN COMPARISON:  None. FINDINGS: CTA NECK FINDINGS SKELETON: There is no bony spinal canal stenosis. No lytic or blastic lesion. OTHER NECK: Normal pharynx, larynx and major salivary glands. No cervical lymphadenopathy. Unremarkable thyroid gland. UPPER CHEST: No pneumothorax or pleural effusion. No nodules or masses. AORTIC ARCH: There is no calcific atherosclerosis of the aortic arch. There is no aneurysm, dissection or hemodynamically significant stenosis of the visualized portion of the aorta. Conventional 3 vessel aortic branching pattern. The visualized proximal subclavian arteries are widely patent. RIGHT CAROTID SYSTEM: No dissection, occlusion or aneurysm. There is mixed density atherosclerosis extending into the proximal ICA, resulting in less than 50% stenosis. LEFT CAROTID SYSTEM: No dissection, occlusion or aneurysm. There is mixed density atherosclerosis extending into the proximal ICA, resulting in less than 50% stenosis. VERTEBRAL ARTERIES: Left dominant configuration. Both origins are clearly  patent. There is no dissection, occlusion or flow-limiting stenosis to the skull base (V1-V3 segments). CTA HEAD FINDINGS POSTERIOR CIRCULATION: --Vertebral arteries: Normal V4 segments. --Inferior cerebellar arteries: Normal. --Basilar artery: Normal. --Superior cerebellar arteries: Normal. --Posterior cerebral arteries (PCA): Normal. ANTERIOR CIRCULATION: --Intracranial internal carotid arteries: Normal. --Anterior cerebral arteries (ACA): Normal. Both A1 segments are present. Patent anterior communicating artery (a-comm). --Middle cerebral arteries (MCA): Normal. VENOUS SINUSES: As permitted by contrast timing, patent. ANATOMIC VARIANTS: None Review of the MIP images confirms the above findings. IMPRESSION: 1. No intracranial arterial occlusion or high-grade stenosis. 2. Bilateral carotid bifurcation atherosclerosis without hemodynamically significant stenosis by NASCET criteria. Electronically Signed   By: Deatra Robinson M.D.   On: 01/17/2021 01:02   MR BRAIN WO CONTRAST  Result Date: 01/16/2021 CLINICAL DATA:  Acute stroke presentation. Specific symptoms not described. EXAM: MRI HEAD WITHOUT CONTRAST TECHNIQUE: Multiplanar, multiecho pulse sequences of the brain and surrounding structures were obtained without intravenous contrast. COMPARISON:  Head CT earlier same day. FINDINGS: Brain: Diffusion imaging shows a 1 cm acute infarction in the posterior basal ganglia/posterior limb  internal capsule on the right. No other acute infarction. Chronic small-vessel ischemic changes affect pons. Few old small vessel cerebellar infarctions with hemosiderin deposition. Old small vessel infarctions of the thalami. Chronic small-vessel ischemic changes throughout the deep and subcortical white matter. No large vessel territory infarction. Few scattered foci hemosiderin deposition within the deep hemispheres as well. No mass, hydrocephalus or extra-axial collection. Vascular: Major vessels at the base of the brain show  flow. Skull and upper cervical spine: Negative Sinuses/Orbits: Clear/normal Other: None IMPRESSION: 1 cm acute infarction at the posterior basal ganglia/posterior limb internal capsule on the right. Extensive chronic small-vessel ischemic changes elsewhere throughout the brain as described above, several showing hemosiderin deposition related to old microhemorrhage. Electronically Signed   By: Paulina Fusi M.D.   On: 01/16/2021 20:51      Scheduled Meds:   stroke: mapping our early stages of recovery book   Does not apply Once   apixaban  5 mg Oral BID   aspirin EC  81 mg Oral Daily   isosorbide mononitrate  30 mg Oral Daily   metoprolol succinate  75 mg Oral BID   rosuvastatin  40 mg Oral QHS   sacubitril-valsartan  1 tablet Oral BID   Continuous Infusions:   LOS: 0 days      Time spent: 30 minutes   Noralee Stain, DO [facility] 01/17/2021, 2:39 PM   Available via Epic secure chat 7am-7pm After these hours, please refer to coverage provider listed on [email or web address]

## 2021-01-17 NOTE — Evaluation (Signed)
Physical Therapy Evaluation Patient Details Name: Stanley [name]: [id number] DOB: 1945-12-29 Today's Date: 01/17/2021   History of Present Illness  Pt is a 75 y/o male admitted 6/15 secondary to LUE and LLE weakness and decreased coordination. Found to have infarct in posterior basal ganglia/posterior limb  internal capsule on the right. PMH includes CAD s/p CABG, HTN, a fib, and CHF.  Clinical Impression  Pt admitted secondary to problem above with deficits below. Pt with functional weakness noted in LLE and decreased coordination. Pt with increased unsteadiness as gait progressed and had increased L knee buckling. Required up to heavy mod A +2 for steadying during gait. Pt was previously very independent. Feel he would benefit from CIR level therapies to increase independence and safety. Will continue to follow acutely.     Follow Up Recommendations CIR    Equipment Recommendations  Rolling walker with 5" wheels;3in1 (PT)    Recommendations for Other Services Rehab consult     Precautions / Restrictions Precautions Precautions: Fall Precaution Comments: Had fall on saturday when he first had symptoms Restrictions Weight Bearing Restrictions: No      Mobility  Bed Mobility Overal bed mobility: Needs Assistance Bed Mobility: Supine to Sit;Sit to Supine     Supine to sit: Min assist Sit to supine: Min assist   General bed mobility comments: Required assist for trunk and LE assist.    Transfers Overall transfer level: Needs assistance Equipment used: 2 person hand held assist Transfers: Sit to/from Stand Sit to Stand: Min assist;+2 physical assistance         General transfer comment: Min A + 2 for lift assist  Ambulation/Gait Ambulation/Gait assistance: Min assist;Mod assist;+2 physical assistance Gait Distance (Feet): 50 Feet Assistive device: 2 person hand held assist Gait Pattern/deviations: Step-through pattern;Ataxic;Decreased stride length Gait  velocity: Decreased   General Gait Details: Pt initially requiring min A +2, however, as gait progressed, had increased unsteadiness requiring heavy mod A  +2 for steadying. Functional weakness noted in LLE and LLE began to buckle on pt.  Stairs            Wheelchair Mobility    Modified Rankin (Stroke Patients Only) Modified Rankin (Stroke Patients Only) Pre-Morbid Rankin Score: No symptoms Modified Rankin: Moderately severe disability     Balance Overall balance assessment: Needs assistance Sitting-balance support: No upper extremity supported;Feet supported Sitting balance-Leahy Scale: Fair     Standing balance support: Bilateral upper extremity supported;During functional activity Standing balance-Leahy Scale: Poor Standing balance comment: Reliant on BUE support                             Pertinent Vitals/Pain Pain Assessment: No/denies pain    Home Living Family/patient expects to be discharged to:: Private residence Living Arrangements: Spouse/significant other;Parent Available Help at Discharge: Family Type of Home: House Home Access: Stairs to enter Entrance Stairs-Rails: Right Entrance Stairs-Number of Steps: 4 Home Layout: One level Home Equipment: None      Prior Function Level of Independence: Independent         Comments: Still driving     Hand Dominance        Extremity/Trunk Assessment   Upper Extremity Assessment Upper Extremity Assessment: Defer to OT evaluation    Lower Extremity Assessment Lower Extremity Assessment: LLE deficits/detail LLE Deficits / Details: Decreased coordnation and functional weakness noted with increased distance. LLE Coordination: decreased gross motor    Cervical / Trunk Assessment  Cervical / Trunk Assessment: Kyphotic  Communication   Communication: No difficulties  Cognition Arousal/Alertness: Awake/alert Behavior During Therapy: WFL for tasks assessed/performed Overall Cognitive  Status: No family/caregiver present to determine baseline cognitive functioning                                 General Comments: Low knowledge base for healthcare issues. Likely close to baseline      General Comments      Exercises     Assessment/Plan    PT Assessment Patient needs continued PT services  PT Problem List Decreased strength;Decreased balance;Decreased mobility;Decreased activity tolerance;Decreased knowledge of use of DME;Decreased knowledge of precautions;Decreased cognition;Decreased safety awareness;Impaired sensation       PT Treatment Interventions DME instruction;Gait training;Stair training;Functional mobility training;Therapeutic activities;Therapeutic exercise;Balance training;Patient/family education;Cognitive remediation;Neuromuscular re-education    PT Goals (Current goals can be found in the Care Plan section)  Acute Rehab PT Goals Patient Stated Goal: to be independent PT Goal Formulation: With patient Time For Goal Achievement: 01/31/21 Potential to Achieve Goals: Good    Frequency Min 4X/week   Barriers to discharge        Co-evaluation PT/OT/SLP Co-Evaluation/Treatment: Yes Reason for Co-Treatment: For patient/therapist safety;To address functional/ADL transfers PT goals addressed during session: Mobility/safety with mobility;Balance         AM-PAC PT "6 Clicks" Mobility  Outcome Measure Help needed turning from your back to your side while in a flat bed without using bedrails?: A Little Help needed moving from lying on your back to sitting on the side of a flat bed without using bedrails?: A Little Help needed moving to and from a bed to a chair (including a wheelchair)?: A Lot Help needed standing up from a chair using your arms (e.g., wheelchair or bedside chair)?: A Little Help needed to walk in hospital room?: A Lot Help needed climbing 3-5 steps with a railing? : Total 6 Click Score: 14    End of Session  Equipment Utilized During Treatment: Gait belt Activity Tolerance: Patient tolerated treatment well Patient left: in bed;with call bell/phone within reach (on stretcher in ED) Nurse Communication: Mobility status PT Visit Diagnosis: Unsteadiness on feet (R26.81);Muscle weakness (generalized) (M62.81);Difficulty in walking, not elsewhere classified (R26.2)    Time: 1000-1028 PT Time Calculation (min) (ACUTE ONLY): 28 min   Charges:   PT Evaluation $PT Eval Moderate Complexity: 1 Mod          Farley Ly, PT, DPT  Acute Rehabilitation Services  Pager: [phone] Office: [phone]   Lehman Prom 01/17/2021, 11:36 AM

## 2021-01-18 ENCOUNTER — Ambulatory Visit: Payer: Medicare Other | Admitting: Cardiology

## 2021-01-18 ENCOUNTER — Inpatient Hospital Stay (HOSPITAL_COMMUNITY): Payer: Medicare Other

## 2021-01-18 DIAGNOSIS — I6389 Other cerebral infarction: Secondary | ICD-10-CM

## 2021-01-18 LAB — ECHOCARDIOGRAM COMPLETE
AR max vel: 3.97 cm2
AV Area VTI: 3.47 cm2
AV Area mean vel: 3.63 cm2
AV Mean grad: 2 mmHg
AV Peak grad: 4 mmHg
Ao pk vel: 1 m/s
Area-P 1/2: 2.02 cm2
S' Lateral: 4.1 cm

## 2021-01-18 LAB — HEMOGLOBIN A1C
Hgb A1c MFr Bld: 6.7 % — ABNORMAL HIGH (ref 4.8–5.6)
Mean Plasma Glucose: 146 mg/dL

## 2021-01-18 MED ORDER — PERFLUTREN LIPID MICROSPHERE
1.0000 mL | INTRAVENOUS | Status: AC | PRN
  Administered 2021-01-18: 5 mL via INTRAVENOUS
  Filled 2021-01-18: qty 10

## 2021-01-18 NOTE — Evaluation (Signed)
Speech Language Pathology Evaluation Patient Details Name: Stanley [name]: [id number] DOB: Aug 25, 1945 Today's Date: 01/18/2021 Time: 8295-6213 SLP Time Calculation (min) (ACUTE ONLY): 17.2 min  Problem List:  Patient Active Problem List   Diagnosis Date Noted   CVA (cerebral vascular accident) (HCC) 01/16/2021   Left-sided weakness 01/16/2021   Atrial flutter with rapid ventricular response (HCC) 07/23/2018   Ileus, postoperative (HCC)    Acute appendicitis with localized peritonitis    Chronic combined systolic and diastolic congestive heart failure (HCC)    Acute appendicitis 07/19/2016   Tricuspid regurgitation    Severe mitral regurgitation 08/04/2015   Hyperlipidemia    Hypertensive heart disease    Atrial fibrillation (HCC) 08/02/2015   A-fib (HCC) 08/02/2015   Persistent atrial fibrillation (HCC) 08/02/2015   Chronic anticoagulation - Eliquis, CHADS2VASC=4 07/23/2015   Acute on chronic combined systolic and diastolic heart failure, NYHA class 2 (HCC) 07/13/2015   Acute on chronic combined systolic and diastolic congestive heart failure, NYHA class 1 (HCC) 07/13/2015   Mitral regurgitation 07/07/2015   Elevated troponin 07/07/2015   Acute kidney injury (HCC) 07/07/2015   Hypotension 07/07/2015   Leukocytosis 07/07/2015   Noncompliance with medication regimen 07/05/2015   Long term current use of anticoagulant therapy - Eliquis 06/11/2015   Essential hypertension 05/01/2015   OSA (obstructive sleep apnea) 10/11/2014   Cardiomyopathy, ischemic: EF 20-25% 07/06/2014   Chronic combined systolic and diastolic CHF (congestive heart failure) (HCC)    Coronary artery disease involving native coronary artery without angina pectoris    Cholecystitis    S/P CABG x 5 02/16/2002   Past Medical History:  Past Medical History:  Diagnosis Date   Atrial flutter with rapid ventricular response (HCC) 07/23/2018   CAD (coronary artery disease)    a. s/p CABG 2003  b. s/p acute  MI prior to CABG w/ EF <30%   Cardiomyopathy, ischemic: EF 20-25%    Cholecystitis    Chronic combined systolic and diastolic CHF (congestive heart failure) (HCC)    Chronic systolic CHF (congestive heart failure) (HCC)    a. 2D ECHO: 05/17/2014: EF 30-35%;  b. 07/2015 TEE: EF 20-25%, diff HK, sev MR, sev dil LA w/o thrombus, mod reduced RV fxn, mod dil RA, mod TR.   Hyperlipidemia    Hypertensive heart disease    Kidney stones X 1   "passed it" (08/02/2015)   OSA (obstructive sleep apnea) 10/11/2014   refuses to use CPAP   Persistent atrial fibrillation (HCC)    a. s/p succesful TEE/DCCV on 06/01/14.  b. on Eliquis;  c. 07/2015 recurrent AF-->Failed DCCV.   S/P CABG x 5 02/16/2002   LIMA to LAD, SVG to D1, SVG to OM2, Sequential SVG to PDA-RPL, saphenous vein harvest from right thigh and lower leg   Severe mitral regurgitation    Functional, improved with improvement in his EF.   Tricuspid regurgitation    Past Surgical History:  Past Surgical History:  Procedure Laterality Date   ATRIAL FIBRILLATION ABLATION N/A 04/11/2020   Procedure: ATRIAL FIBRILLATION ABLATION;  Surgeon: Lanier Prude, MD;  Location: [facility] INVASIVE CV LAB;  Service: Cardiovascular;  Laterality: N/A;   CARDIAC CATHETERIZATION N/A 06/19/2014   Procedure: RIGHT/LEFT HEART CATH AND CORONARY/GRAFT ANGIOGRAPHY;  Surgeon: Lennette Bihari, MD;  Location: [facility] CATH LAB;  Service: Cardiovascular;  Laterality: N/A;   CARDIAC CATHETERIZATION  2003   CARDIAC CATHETERIZATION N/A 08/28/2015   Procedure: Right Heart Cath and Coronary/Graft Angiography;  Surgeon: Laurey Morale, MD;  Location: [facility] INVASIVE CV LAB;  Service: Cardiovascular;  Laterality: N/A;   CARDIOVERSION N/A 06/01/2014   Procedure: CARDIOVERSION;  Surgeon: Lars Masson, MD;  Location: [facility] ENDOSCOPY;  Service: Cardiovascular;  Laterality: N/A;   CARDIOVERSION  06/11/2015; 08/02/2015   CARDIOVERSION N/A 08/02/2015   Procedure: CARDIOVERSION;  Surgeon: Wendall Stade, MD;  Location: [facility] ENDOSCOPY;  Service: Cardiovascular;  Laterality: N/A;   CARDIOVERSION N/A 10/22/2015   Procedure: CARDIOVERSION;  Surgeon: Laurey Morale, MD;  Location: [facility] ENDOSCOPY;  Service: Cardiovascular;  Laterality: N/A;   CARDIOVERSION N/A 03/15/2020   Procedure: CARDIOVERSION;  Surgeon: Laurey Morale, MD;  Location: [facility] ENDOSCOPY;  Service: Cardiovascular;  Laterality: N/A;   CARDIOVERSION N/A 05/14/2020   Procedure: CARDIOVERSION;  Surgeon: [name], MD;  Location: [facility] ENDOSCOPY;  Service: Cardiovascular;  Laterality: N/A;   CORONARY ARTERY BYPASS GRAFT  02/16/2002   CABG X5   LAPAROSCOPIC APPENDECTOMY N/A 07/20/2016   Procedure: APPENDECTOMY LAPAROSCOPIC;  Surgeon: Manus Rudd, MD;  Location: [facility] OR;  Service: General;  Laterality: N/A;   TEE WITHOUT CARDIOVERSION N/A 06/01/2014   Procedure: TRANSESOPHAGEAL ECHOCARDIOGRAM (TEE);  Surgeon: Lars Masson, MD;  Location: [facility] ENDOSCOPY;  Service: Cardiovascular;  Laterality: N/A;   TEE WITHOUT CARDIOVERSION N/A 08/03/2015   Procedure: TRANSESOPHAGEAL ECHOCARDIOGRAM (TEE);  Surgeon: Lars Masson, MD;  Location: [facility] ENDOSCOPY;  Service: Cardiovascular;  Laterality: N/A;   HPI:  Pt is a 75 y/o male admitted 6/15 secondary to LUE and LLE weakness and decreased coordination. Found to have infarct in posterior basal ganglia/posterior limb  internal capsule on the right. PMH includes CAD s/p CABG, HTN, a fib, and CHF.   Assessment / Plan / Recommendation Clinical Impression  Pt participated in speech/language/cognition evaluation. With the pt's permission, pt's wife was contacted via phone to assess pt's baseline. Pt's wife reported that the pt sometimes seems "like he's not there" and has been noted to "stand and stare at the pillbox" while attempting to determine his next step or what day of the week it is. Pt's wife reported baseline deficits in memory and attention which have been present for some time, but have  worsened within the last year. Pt denied any acute changes in speech, language, or cognition. He initially denied any baseline deficits in cognition, but upon SLP's review of the results, pt stated "I've always had trouble with memory and my wife says I don't pay attention." The [facility] Mental Status Examination was completed to evaluate the pt's cognitive-linguistic skills. He achieved a score of 20/30 which is below the normal limits of 27 or more out of 30. He exhibited difficulty in the areas of memory, attention, complex problem solving, and executive function. Based on the reports of the pt's wife, pt's performance appears to be at baseline. Further acute skilled SLP services are not clinically indicated at this time. However, pt's wife has verbalized agreement regarding following up with the pt's PCP to help with referrals to determine etiology of pt's cognitive-linguistic symptoms. SLP services may be initiated at a subsequent level of care.    SLP Assessment  SLP Recommendation/Assessment: All further Speech Lanaguage Pathology  needs can be addressed in the next venue of care SLP Visit Diagnosis: Cognitive communication deficit (R41.841)    Follow Up Recommendations  Inpatient Rehab    Frequency and Duration           SLP Evaluation Cognition  Overall Cognitive Status: History of cognitive impairments - at baseline Arousal/Alertness: Awake/alert  Orientation Level: Oriented X4 Attention: Focused;Sustained Focused Attention: Appears intact Sustained Attention: Impaired Sustained Attention Impairment: Verbal complex Memory: Impaired Memory Impairment: Retrieval deficit;Decreased recall of new information (Immediate: 5/5; delayed: 3/5; with cues: 2/2) Awareness: Impaired Awareness Impairment: Emergent impairment Problem Solving: Impaired Problem Solving Impairment: Verbal complex Executive Function: Sequencing;Organizing Sequencing: Appears intact (Clock drawing:  4/4) Organizing: Impaired Organizing Impairment: Verbal complex (Backward digit span: 1/2)       Comprehension  Auditory Comprehension Overall Auditory Comprehension: Appears within functional limits for tasks assessed Yes/No Questions: Within Functional Limits Commands: Within Functional Limits Conversation: Complex    Expression Expression Primary Mode of Expression: Verbal Verbal Expression Overall Verbal Expression: Appears within functional limits for tasks assessed Initiation: No impairment Level of Generative/Spontaneous Verbalization: Conversation Repetition: No impairment Naming: No impairment Pragmatics: No impairment   Oral / Motor  Oral Motor/Sensory Function Overall Oral Motor/Sensory Function: Within functional limits Motor Speech Overall Motor Speech: Appears within functional limits for tasks assessed Respiration: Within functional limits Phonation: Normal Resonance: Within functional limits Articulation: Within functional limitis Intelligibility: Intelligible Motor Planning: Witnin functional limits Motor Speech Errors: Not applicable   Claryce Friel I. Vear Clock, MS, CCC-SLP Acute Rehabilitation Services Office number [phone] Pager [phone]                    Scheryl Marten 01/18/2021, 4:48 PM

## 2021-01-18 NOTE — Progress Notes (Addendum)
IP rehab admissions - I met with patient and his wife at the bedside.  I gave wife rehab booklets and I discussed inpatient rehab here at [facility] with patient.  Wife and patient would like CIR due to left leg weakness.  I spoke with Dr. Maylene Roes who tells me patient should be ready for DC to CIR tomorrow, Saturday.  I will plan for admit to CIR tomorrow, Saturday.  Dr. Letta Pate will assess patient Saturday.  Please call [phone] after 12 noon to give report and to confirm admission.  Call me for questions.  [phone]

## 2021-01-18 NOTE — Progress Notes (Signed)
  Echocardiogram 2D Echocardiogram has been performed.  Gerda Diss 01/18/2021, 1:56 PM

## 2021-01-18 NOTE — Progress Notes (Addendum)
PROGRESS NOTE    Stanley Wells  XJO:[id number] DOB: 09-24-1945 DOA: 01/16/2021 PCP: [name], MD     Brief Narrative:  Stanley Wells is a 75 y.o. male with PMH of CAD, A. fib status post ablation still on Eliquis, CHF, HLD, HTN, OSA who presents for left arm and leg weakness. Reports this started on Saturday. Last known normal on Friday. Was camping over the weekend in [place] and woke up and immediately fell to the ground. Has been unable to bear weight. Reduced grip strength of left hand. Symptoms have been persistent since initially started without improvement. No slurred speech confusion headache nausea vomiting loss of consciousness vision changes neck pain.He states that he has never had a stroke before. MRI of the brain with a lacunar infarction of the posterior limb of the right internal capsule. Neurology consulted.   New events last 24 hours / Subjective: Patient without any complaints, no new symptoms overnight.  Continues to have some left-sided weakness.  Agreeable to CIR.  Assessment & Plan:   Active Problems:   Chronic combined systolic and diastolic CHF (congestive heart failure) (HCC)   Coronary artery disease involving native coronary artery without angina pectoris   OSA (obstructive sleep apnea)   Essential hypertension   Long term current use of anticoagulant therapy - Eliquis   Persistent atrial fibrillation (HCC)   Hyperlipidemia   Hypertensive heart disease   S/P CABG x 5   CVA (cerebral vascular accident) (HCC)   Left-sided weakness   Acute CVA -Stroke team following, follow-up in stroke clinic in 4 weeks  -Echocardiogram pending -Continue Eliquis, aspirin, Crestor -PT OT recommending CIR evaluation  Essential hypertension -Outside of window for permissive hypertension -Continue Entresto, Imdur, metoprolol  Chronic systolic and diastolic heart failure -Does not appear to be overtly fluid overloaded at this time -Continue Entresto, Imdur,  metoprolol.  Lasix is taken on as-needed basis at baseline  Chronic A. fib -Continue Eliquis  OSA -Continue CPAP nightly  AKI -Baseline Cr ~1-1.1 -Resolved  Diabetes mellitus -Hemoglobin A1c 6.7   DVT prophylaxis:   apixaban (ELIQUIS) tablet 5 mg  Code Status:     Code Status Orders  (From admission, onward)           Start     Ordered   01/17/21 0022  Do not attempt resuscitation (DNR)  Continuous       Question Answer Comment  In the event of cardiac or respiratory ARREST Do not call a "code blue"   In the event of cardiac or respiratory ARREST Do not perform Intubation, CPR, defibrillation or ACLS   In the event of cardiac or respiratory ARREST Use medication by any route, position, wound care, and other measures to relive pain and suffering. May use oxygen, suction and manual treatment of airway obstruction as needed for comfort.      01/17/21 0021           Code Status History     Date Active Date Inactive Code Status Order ID Comments User Context   04/11/2020 1628 04/12/2020 0056 Full Code [id number]  Lanier Prude, MD Inpatient   07/19/2016 1247 07/25/2016 1720 Full Code [id number]  Valentino Nose, MD Inpatient   08/28/2015 1159 08/28/2015 1859 Full Code [id number]  Laurey Morale, MD Inpatient   08/02/2015 1526 08/04/2015 1553 Full Code [id number]  Marily Lente, NP Inpatient   07/13/2015 1723 07/15/2015 1417 Full Code [id number]  Dwana Melena, PA-C Inpatient  07/05/2015 1359 07/07/2015 1727 Full Code [id number]  Azalee CourseMeng, Hao, [place] Inpatient   06/19/2014 1122 06/19/2014 1817 Full Code [id number]  Lennette BihariKelly, Thomas A, MD Inpatient   05/16/2014 1634 05/18/2014 1713 Full Code [id number]  Barrett, Shawn Stallhonda G, PA-C ED      Family Communication: None at bedside Disposition Plan:  Status is: Inpatient  Remains inpatient appropriate because:Inpatient level of care appropriate due to severity of illness  Dispo: The patient is from: Home              Anticipated d/c is  to: CIR              Patient currently is medically stable to d/c.   Difficult to place patient No          Consultants:  Neurology   Procedures:  None   Antimicrobials:  Anti-infectives (From admission, onward)    None        Objective: Vitals:   01/17/21 2332 01/18/21 0340 01/18/21 0731 01/18/21 1110  BP: (!) 156/103 (!) 138/98 (!) 173/89 (!) 160/107  Pulse: 60 (!) 54 (!) 48 60  Resp: 17 16 16 18   Temp: 97.6 F (36.4 C) 97.8 F (36.6 C) 97.7 F (36.5 C) 97.8 F (36.6 C)  TempSrc: Oral Oral Oral Oral  SpO2: 98% 97% 97% 100%   No intake or output data in the 24 hours ending 01/18/21 1343 There were no vitals filed for this visit. Examination: General exam: Appears calm and comfortable  Respiratory system: Clear to auscultation. Respiratory effort normal. Cardiovascular system: S1 & S2 heard. No pedal edema. Gastrointestinal system: Abdomen is nondistended, soft and nontender. Normal bowel sounds heard. Central nervous system: Alert and oriented. Non focal exam. Speech clear  Extremities: Symmetric in appearance bilaterally  Skin: No rashes, lesions or ulcers on exposed skin  Psychiatry: Judgement and insight appear stable. Mood & affect appropriate.    Data Reviewed: I have personally reviewed following labs and imaging studies  CBC: Recent Labs  Lab 01/14/21 0813 01/16/21 1320  WBC 10.4 7.3  NEUTROABS 6.9 4.9  HGB 13.9 14.1  HCT 44.1 43.3  MCV 95.9 94.5  PLT 232 202    Basic Metabolic Panel: Recent Labs  Lab 01/14/21 0813 01/16/21 1320  NA 137 140  K 3.5 3.9  CL 106 109  CO2 24 24  GLUCOSE 106* 185*  BUN 35* 19  CREATININE 1.51* 1.22  CALCIUM 9.0 8.6*    GFR: CrCl cannot be calculated (Unknown ideal weight.). Liver Function Tests: Recent Labs  Lab 01/14/21 0813 01/16/21 1320  AST 26 27  ALT 23 30  ALKPHOS 68 65  BILITOT 1.1 0.9  PROT 6.1* 6.0*  ALBUMIN 3.4* 3.5    No results for input(s): LIPASE, AMYLASE in the last  168 hours. No results for input(s): AMMONIA in the last 168 hours. Coagulation Profile: Recent Labs  Lab 01/16/21 1320  INR 1.1    Cardiac Enzymes: No results for input(s): CKTOTAL, CKMB, CKMBINDEX, TROPONINI in the last 168 hours. BNP (last 3 results) No results for input(s): PROBNP in the last 8760 hours. HbA1C: Recent Labs    01/17/21 0344  HGBA1C 6.7*   CBG: No results for input(s): GLUCAP in the last 168 hours. Lipid Profile: Recent Labs    01/17/21 0344  CHOL 107  HDL 33*  LDLCALC 41  TRIG [id number]*  CHOLHDL 3.2    Thyroid Function Tests: No results for input(s): TSH, T4TOTAL, FREET4, T3FREE, THYROIDAB in the last 72 hours.  Anemia Panel: No results for input(s): VITAMINB12, FOLATE, FERRITIN, TIBC, IRON, RETICCTPCT in the last 72 hours. Sepsis Labs: No results for input(s): PROCALCITON, LATICACIDVEN in the last 168 hours.  Recent Results (from the past 240 hour(s))  SARS CORONAVIRUS 2 (TAT 6-24 HRS) Nasopharyngeal Nasopharyngeal Swab     Status: None   Collection Time: 01/17/21  8:25 AM   Specimen: Nasopharyngeal Swab  Result Value Ref Range Status   SARS Coronavirus 2 NEGATIVE NEGATIVE Final    Comment: (NOTE) SARS-CoV-2 target nucleic acids are NOT DETECTED.  The SARS-CoV-2 RNA is generally detectable in upper and lower respiratory specimens during the acute phase of infection. Negative results do not preclude SARS-CoV-2 infection, do not rule out co-infections with other pathogens, and should not be used as the sole basis for treatment or other patient management decisions. Negative results must be combined with clinical observations, patient history, and epidemiological information. The expected result is Negative.  Fact Sheet for Patients: HairSlick.no  Fact Sheet for Healthcare Providers: quierodirigir.com  This test is not yet approved or cleared by the [place] FDA and  has been  authorized for detection and/or diagnosis of SARS-CoV-2 by FDA under an Emergency Use Authorization (EUA). This EUA will remain  in effect (meaning this test can be used) for the duration of the COVID-19 declaration under Se ction 564(b)(1) of the Act, 21 U.S.C. section 360bbb-3(b)(1), unless the authorization is terminated or revoked sooner.  Performed at [facility], [address] [place], [place] [address]        Radiology Studies: CT ANGIO HEAD W OR WO CONTRAST  Result Date: 01/17/2021 CLINICAL DATA:  Left upper and lower extremity weakness EXAM: CT ANGIOGRAPHY HEAD AND NECK TECHNIQUE: Multidetector CT imaging of the head and neck was performed using the standard protocol during bolus administration of intravenous contrast. Multiplanar CT image reconstructions and MIPs were obtained to evaluate the vascular anatomy. Carotid stenosis measurements (when applicable) are obtained utilizing NASCET criteria, using the distal internal carotid diameter as the denominator. CONTRAST:  58mL OMNIPAQUE IOHEXOL 350 MG/ML SOLN COMPARISON:  None. FINDINGS: CTA NECK FINDINGS SKELETON: There is no bony spinal canal stenosis. No lytic or blastic lesion. OTHER NECK: Normal pharynx, larynx and major salivary glands. No cervical lymphadenopathy. Unremarkable thyroid gland. UPPER CHEST: No pneumothorax or pleural effusion. No nodules or masses. AORTIC ARCH: There is no calcific atherosclerosis of the aortic arch. There is no aneurysm, dissection or hemodynamically significant stenosis of the visualized portion of the aorta. Conventional 3 vessel aortic branching pattern. The visualized proximal subclavian arteries are widely patent. RIGHT CAROTID SYSTEM: No dissection, occlusion or aneurysm. There is mixed density atherosclerosis extending into the proximal ICA, resulting in less than 50% stenosis. LEFT CAROTID SYSTEM: No dissection, occlusion or aneurysm. There is mixed density atherosclerosis extending into  the proximal ICA, resulting in less than 50% stenosis. VERTEBRAL ARTERIES: Left dominant configuration. Both origins are clearly patent. There is no dissection, occlusion or flow-limiting stenosis to the skull base (V1-V3 segments). CTA HEAD FINDINGS POSTERIOR CIRCULATION: --Vertebral arteries: Normal V4 segments. --Inferior cerebellar arteries: Normal. --Basilar artery: Normal. --Superior cerebellar arteries: Normal. --Posterior cerebral arteries (PCA): Normal. ANTERIOR CIRCULATION: --Intracranial internal carotid arteries: Normal. --Anterior cerebral arteries (ACA): Normal. Both A1 segments are present. Patent anterior communicating artery (a-comm). --Middle cerebral arteries (MCA): Normal. VENOUS SINUSES: As permitted by contrast timing, patent. ANATOMIC VARIANTS: None Review of the MIP images confirms the above findings. IMPRESSION: 1. No intracranial arterial occlusion or high-grade stenosis. 2.  Bilateral carotid bifurcation atherosclerosis without hemodynamically significant stenosis by NASCET criteria. Electronically Signed   By: Deatra Robinson M.D.   On: 01/17/2021 01:02   CT Head Wo Contrast  Result Date: 01/16/2021 CLINICAL DATA:  Neural deficit. EXAM: CT HEAD WITHOUT CONTRAST TECHNIQUE: Contiguous axial images were obtained from the base of the skull through the vertex without intravenous contrast. COMPARISON:  None. FINDINGS: Brain: No evidence of acute hemorrhage, hydrocephalus, extra-axial collection or mass lesion/mass effect. Advanced deep white matter microangiopathic changes. Moderate brain parenchymal volume loss. Possible age-indeterminate lacunar infarcts in the posterior aspect of the basal ganglia and bilateral thalami. Vascular: No hyperdense vessel or unexpected calcification. Skull: Normal. Negative for fracture or focal lesion. Sinuses/Orbits: No acute finding. Other: None. IMPRESSION: 1. Possible age-indeterminate lacunar infarcts in the posterior aspects of the basal ganglia and  bilateral thalami. 2. Advanced deep white matter microangiopathic changes. 3. Moderate brain parenchymal volume loss. Electronically Signed   By: Ted Mcalpine M.D.   On: 01/16/2021 14:05   CT ANGIO NECK W OR WO CONTRAST  Result Date: 01/17/2021 CLINICAL DATA:  Left upper and lower extremity weakness EXAM: CT ANGIOGRAPHY HEAD AND NECK TECHNIQUE: Multidetector CT imaging of the head and neck was performed using the standard protocol during bolus administration of intravenous contrast. Multiplanar CT image reconstructions and MIPs were obtained to evaluate the vascular anatomy. Carotid stenosis measurements (when applicable) are obtained utilizing NASCET criteria, using the distal internal carotid diameter as the denominator. CONTRAST:  50mL OMNIPAQUE IOHEXOL 350 MG/ML SOLN COMPARISON:  None. FINDINGS: CTA NECK FINDINGS SKELETON: There is no bony spinal canal stenosis. No lytic or blastic lesion. OTHER NECK: Normal pharynx, larynx and major salivary glands. No cervical lymphadenopathy. Unremarkable thyroid gland. UPPER CHEST: No pneumothorax or pleural effusion. No nodules or masses. AORTIC ARCH: There is no calcific atherosclerosis of the aortic arch. There is no aneurysm, dissection or hemodynamically significant stenosis of the visualized portion of the aorta. Conventional 3 vessel aortic branching pattern. The visualized proximal subclavian arteries are widely patent. RIGHT CAROTID SYSTEM: No dissection, occlusion or aneurysm. There is mixed density atherosclerosis extending into the proximal ICA, resulting in less than 50% stenosis. LEFT CAROTID SYSTEM: No dissection, occlusion or aneurysm. There is mixed density atherosclerosis extending into the proximal ICA, resulting in less than 50% stenosis. VERTEBRAL ARTERIES: Left dominant configuration. Both origins are clearly patent. There is no dissection, occlusion or flow-limiting stenosis to the skull base (V1-V3 segments). CTA HEAD FINDINGS POSTERIOR  CIRCULATION: --Vertebral arteries: Normal V4 segments. --Inferior cerebellar arteries: Normal. --Basilar artery: Normal. --Superior cerebellar arteries: Normal. --Posterior cerebral arteries (PCA): Normal. ANTERIOR CIRCULATION: --Intracranial internal carotid arteries: Normal. --Anterior cerebral arteries (ACA): Normal. Both A1 segments are present. Patent anterior communicating artery (a-comm). --Middle cerebral arteries (MCA): Normal. VENOUS SINUSES: As permitted by contrast timing, patent. ANATOMIC VARIANTS: None Review of the MIP images confirms the above findings. IMPRESSION: 1. No intracranial arterial occlusion or high-grade stenosis. 2. Bilateral carotid bifurcation atherosclerosis without hemodynamically significant stenosis by NASCET criteria. Electronically Signed   By: Deatra Robinson M.D.   On: 01/17/2021 01:02   MR BRAIN WO CONTRAST  Result Date: 01/16/2021 CLINICAL DATA:  Acute stroke presentation. Specific symptoms not described. EXAM: MRI HEAD WITHOUT CONTRAST TECHNIQUE: Multiplanar, multiecho pulse sequences of the brain and surrounding structures were obtained without intravenous contrast. COMPARISON:  Head CT earlier same day. FINDINGS: Brain: Diffusion imaging shows a 1 cm acute infarction in the posterior basal ganglia/posterior limb internal capsule on the right. No other  acute infarction. Chronic small-vessel ischemic changes affect pons. Few old small vessel cerebellar infarctions with hemosiderin deposition. Old small vessel infarctions of the thalami. Chronic small-vessel ischemic changes throughout the deep and subcortical white matter. No large vessel territory infarction. Few scattered foci hemosiderin deposition within the deep hemispheres as well. No mass, hydrocephalus or extra-axial collection. Vascular: Major vessels at the base of the brain show flow. Skull and upper cervical spine: Negative Sinuses/Orbits: Clear/normal Other: None IMPRESSION: 1 cm acute infarction at the  posterior basal ganglia/posterior limb internal capsule on the right. Extensive chronic small-vessel ischemic changes elsewhere throughout the brain as described above, several showing hemosiderin deposition related to old microhemorrhage. Electronically Signed   By: Paulina Fusi M.D.   On: 01/16/2021 20:51      Scheduled Meds:   stroke: mapping our early stages of recovery book   Does not apply Once   apixaban  5 mg Oral BID   aspirin EC  81 mg Oral Daily   isosorbide mononitrate  30 mg Oral Daily   metoprolol succinate  75 mg Oral BID   rosuvastatin  40 mg Oral QHS   sacubitril-valsartan  1 tablet Oral BID   Continuous Infusions:   LOS: 1 day      Time spent: 20 minutes   Noralee Stain, DO [facility] 01/18/2021, 1:43 PM   Available via Epic secure chat 7am-7pm After these hours, please refer to coverage provider listed on [email or web address]

## 2021-01-18 NOTE — PMR Pre-admission (Signed)
PMR Admission Coordinator Pre-Admission Assessment  Patient: Stanley Wells is an 75 y.o., male MRN: [id number] DOB: [birth year] Height:   Weight:    Insurance Information HMO:     PPO:      PCP:      IPA:      80/20:      OTHER:  PRIMARY: Medicare A and B      Policy#: [id number]      Subscriber: patient CM Name:        Phone#:       Fax#:   Pre-Cert#:        Employer: Retired Benefits:  Phone #:       Name: Checked in [place] [employer] source Eff. Date: 08/05/11     Deduct: $1556      Out of Pocket Max: none      Life Max: N/A CIR: 100%      SNF: 100 days Outpatient: 80%     Co-Pay: 20% Home Health: 100%      Co-Pay: none DME: 80%     Co-Pay: 20% Providers: patient's choice  SECONDARY: BCBS supplement      Policy#: [id number]     Phone#: [phone]  Financial Counselor:        Phone#:    The "Data Collection Information Summary" for patients in Inpatient Rehabilitation Facilities with attached "Privacy Act [place] Records" was provided and verbally reviewed with: Patient and Family  Emergency Contact Information Contact Information     Name Relation Home Work Mobile   Fortine Spouse [phone]  [phone]   Corie Chiquito Daughter   [phone]   Darroll, Bredeson   [phone]       Current Medical History  Patient Admitting Diagnosis: Lacunar infarct posterior R IC  History of Present Illness: A 75 y.o. male past medical history of atrial fibrillation/flutter on Eliquis, coronary artery disease status post CABG in 2003, chronic combined systolic and diastolic heart failure, hypertension, hyperlipidemia, severe mitral regurgitation and tricuspid regurgitation, presented to the emergency room for evaluation of left-sided weakness with last known well on the Friday, 01/11/2021 when he went to bed around 9 PM.  He woke up Saturday morning and was attempting to get out of bed but fell down because he could not support his weight on his left leg.  He  was having a hard time falling requiring support to walk even small distances at home.  He waited a few days before coming into the emergency room today for evaluation because of symptoms, did improve some but did not resolve.  The wife also said that he kept dropping a bottle drink from his left hand.  No facial weakness.  No slurred speech.  No headaches.  No chest pain or shortness of breath reported.  No preceding illnesses or sicknesses.  No prior history of strokes that the patient knows of.  Has had ablation of his atrial flutter 6 months ago and multiple cardioversions.  Remains on Eliquis-reports compliance to medication.  PT/OT evaluations completed with recommendations for CIR.  SLP evaluation pending 01/18/21.  Patient's medical record from [facility] has been reviewed by the rehabilitation admission coordinator and physician.  Past Medical History  Past Medical History:  Diagnosis Date   Atrial flutter with rapid ventricular response ([place]) 07/23/2018   CAD (coronary artery disease)    a. s/p CABG 2003  b. s/p acute MI prior to CABG w/ EF <30%   Cardiomyopathy, ischemic: EF 20-25%    Cholecystitis  Chronic combined systolic and diastolic CHF (congestive heart failure) (HCC)    Chronic systolic CHF (congestive heart failure) ([place])    a. 2D ECHO: 05/17/2014: EF 30-35%;  b. 07/2015 TEE: EF 20-25%, diff HK, sev MR, sev dil LA w/o thrombus, mod reduced RV fxn, mod dil RA, mod TR.   Hyperlipidemia    Hypertensive heart disease    Kidney stones X 1   "passed it" (08/02/2015)   OSA (obstructive sleep apnea) 10/11/2014   refuses to use CPAP   Persistent atrial fibrillation ([place])    a. s/p succesful TEE/DCCV on 06/01/14.  b. on Eliquis;  c. 07/2015 recurrent AF-->Failed DCCV.   S/P CABG x 5 02/16/2002   LIMA to LAD, SVG to D1, SVG to OM2, Sequential SVG to PDA-RPL, saphenous vein harvest from right thigh and lower leg   Severe mitral regurgitation    Functional, improved with improvement  in his EF.   Tricuspid regurgitation     Family History   family history includes Cancer in his father and mother.  Prior Rehab/Hospitalizations Has the patient had prior rehab or hospitalizations prior to admission? No  Has the patient had major surgery during 100 days prior to admission? No   Current Medications  Current Facility-Administered Medications:     stroke: mapping our early stages of recovery book, , Does not apply, Once, Nicolette Bang, MD   acetaminophen (TYLENOL) tablet 650 mg, 650 mg, Oral, Q4H PRN **OR** acetaminophen (TYLENOL) 160 MG/5ML solution 650 mg, 650 mg, Per Tube, Q4H PRN **OR** acetaminophen (TYLENOL) suppository 650 mg, 650 mg, Rectal, Q4H PRN, Nicolette Bang, MD   apixaban Arne Cleveland) tablet 5 mg, 5 mg, Oral, BID, Nicolette Bang, MD, 5 mg at 01/18/21 1001   aspirin EC tablet 81 mg, 81 mg, Oral, Daily, Rosalin Hawking, MD, 81 mg at 01/18/21 1001   isosorbide mononitrate (IMDUR) 24 hr tablet 30 mg, 30 mg, Oral, Daily, Nicolette Bang, MD, 30 mg at 01/18/21 1001   metoprolol succinate (TOPROL-XL) 24 hr tablet 75 mg, 75 mg, Oral, BID, Nicolette Bang, MD, 75 mg at 01/18/21 1001   rosuvastatin (CRESTOR) tablet 40 mg, 40 mg, Oral, QHS, Rosalin Hawking, MD, 40 mg at 01/17/21 2143   sacubitril-valsartan (ENTRESTO) 97-103 mg per tablet, 1 tablet, Oral, BID, Nicolette Bang, MD, 1 tablet at 01/18/21 1000   senna-docusate (Senokot-S) tablet 1 tablet, 1 tablet, Oral, QHS PRN, Nicolette Bang, MD  Patients Current Diet:  Diet Order             Diet Heart Room service appropriate? No; Fluid consistency: Thin  Diet effective now                   Precautions / Restrictions Precautions Precautions: Fall Precaution Comments: Had fall on saturday when he first had symptoms Restrictions Weight Bearing Restrictions: No   Has the patient had 2 or more falls or a fall with injury in the past year?  No  Prior Activity Level Community (5-7x/wk): Went out at least 4 days a week.  Prior Functional Level Self Care: Did the patient need help bathing, dressing, using the toilet or eating? Independent  Indoor Mobility: Did the patient need assistance with walking from room to room (with or without device)? Independent  Stairs: Did the patient need assistance with internal or external stairs (with or without device)? Independent  Functional Cognition: Did the patient need help planning regular tasks such as shopping or remembering to take  medications? Independent  Home Assistive Devices / Equipment Home Assistive Devices/Equipment: None Home Equipment: None  Prior Device Use: Indicate devices/aids used by the patient prior to current illness, exacerbation or injury? None of the above  Current Functional Level Cognition  Overall Cognitive Status: No family/caregiver present to determine baseline cognitive functioning General Comments: Low knowledge base for healthcare issues. Likely close to baseline.  He demontrates poor awareness of deficits and impaired problem solving    Extremity Assessment (includes Sensation/Coordination)  Upper Extremity Assessment: LUE deficits/detail LUE Deficits / Details: mild dysmetria  Lower Extremity Assessment: LLE deficits/detail LLE Deficits / Details: Decreased coordnation and functional weakness noted with increased distance. LLE Coordination: decreased gross motor    ADLs  Overall ADL's : Needs assistance/impaired Eating/Feeding: Modified independent, Sitting, Bed level Grooming: Wash/dry hands, Oral care, Wash/dry face, Brushing hair, Minimal assistance, Standing Upper Body Bathing: Set up, Sitting Lower Body Bathing: Moderate assistance, Sit to/from stand Upper Body Dressing : Set up, Supervision/safety, Sitting Lower Body Dressing: Moderate assistance, Sit to/from stand Toilet Transfer: Moderate assistance, Ambulation, Comfort height  toilet, RW Toileting- Clothing Manipulation and Hygiene: Moderate assistance, Sit to/from stand Functional mobility during ADLs: Moderate assistance, +2 for safety/equipment, +2 for physical assistance    Mobility  Overal bed mobility: Needs Assistance Bed Mobility: Supine to Sit, Sit to Supine Supine to sit: Min assist Sit to supine: Min assist General bed mobility comments: Required assist for trunk and LE assist.    Transfers  Overall transfer level: Needs assistance Equipment used: 2 person hand held assist Transfers: Sit to/from Stand Sit to Stand: Min assist, +2 physical assistance General transfer comment: Min A + 2 for lift assist    Ambulation / Gait / Stairs / Wheelchair Mobility  Ambulation/Gait Ambulation/Gait assistance: Min assist, Mod assist, +2 physical assistance Gait Distance (Feet): 50 Feet Assistive device: 2 person hand held assist Gait Pattern/deviations: Step-through pattern, Ataxic, Decreased stride length General Gait Details: Pt initially requiring min A +2, however, as gait progressed, had increased unsteadiness requiring heavy mod A  +2 for steadying. Functional weakness noted in LLE and LLE began to buckle on pt. Gait velocity: Decreased    Posture / Balance Balance Overall balance assessment: Needs assistance Sitting-balance support: No upper extremity supported, Feet supported Sitting balance-Leahy Scale: Fair Standing balance support: Bilateral upper extremity supported, During functional activity Standing balance-Leahy Scale: Poor Standing balance comment: Reliant on BUE support    Special needs/care consideration None   Previous Home Environment (from acute therapy documentation) Living Arrangements: Spouse/significant other, Parent  Lives With: Spouse Available Help at Discharge: Family Type of Home: House Home Layout: One level Home Access: Stairs to enter Entrance Stairs-Rails: Right Entrance Stairs-Number of Steps: 4 Bathroom  Shower/Tub: Multimedia programmer: Standard  Discharge Living Setting Plans for Discharge Living Setting: Patient's home, House, Lives with (comment) (Lives with wife) Type of Home at Discharge: House Discharge Home Layout: One level Discharge Home Access: Stairs to enter Entrance Stairs-Rails: Left Entrance Stairs-Number of Steps: 4-5 Discharge Bathroom Shower/Tub: Walk-in shower, Door Discharge Bathroom Toilet: Standard Discharge Bathroom Accessibility: No  Social/Family/Support Systems Patient Roles: Spouse, Parent (Wife at the bedside) Contact Information: Ronne Stefanski - wife - [phone] Anticipated Caregiver: wife and patient Ability/Limitations of Caregiver: Wife and patient are retired Careers adviser: 24/7 (Wife is small frame petite woman.) Discharge Plan Discussed with Primary Caregiver: Yes Is Caregiver In Agreement with Plan?: Yes Does Caregiver/Family have Issues with Lodging/Transportation while Pt is in Rehab?: No  Goals Patient/Family  Goal for Rehab: PT/OT mod I and supervision goals Expected length of stay: 7 days Cultural Considerations: none Pt/Family Agrees to Admission and willing to participate: Yes Program Orientation Provided & Reviewed with Pt/Caregiver Including Roles  & Responsibilities: Yes  Decrease burden of Care through IP rehab admission: N/A  Possible need for SNF placement upon discharge: Not anticipated  Patient Condition: I have reviewed medical records from [facility], spoken with CM, and patient and family member. I met with patient at the bedside for inpatient rehabilitation assessment.  Patient will benefit from ongoing PT, OT, and SLP, can actively participate in 3 hours of therapy a day 5 days of the week, and can make measurable gains during the admission.  Patient will also benefit from the coordinated team approach during an Inpatient Acute Rehabilitation admission.  The patient will receive intensive therapy as  well as Rehabilitation physician, nursing, social worker, and care management interventions.  Due to safety, disease management, medication administration, and patient education the patient requires 24 hour a day rehabilitation nursing.  The patient is currently min to mod assist with mobility and basic ADLs.  Discharge setting and therapy post discharge at home with home health is anticipated.  Patient has agreed to participate in the Acute Inpatient Rehabilitation Program and will admit tomorrow.  Preadmission Screen Completed By:  Retta Diones, 01/18/2021 12:05 PM ______________________________________________________________________   Discussed status with Dr. Naaman Plummer on 01/18/21 at 0930 and received approval for admission tomorrow.  Admission Coordinator:  Retta Diones, RN, time 1205/Date 01/18/21   Assessment/Plan: Diagnosis:RIght int capsule infarct Does the need for close, 24 hr/day Medical supervision in concert with the patient's rehab needs make it unreasonable for this patient to be served in a less intensive setting? Yes Co-Morbidities requiring supervision/potential complications: CHF,AFib, HTN Due to bladder management, bowel management, safety, skin/wound care, disease management, medication administration, pain management, and patient education, does the patient require 24 hr/day rehab nursing? Yes Does the patient require coordinated care of a physician, rehab nurse, PT, OT, and SLP to address physical and functional deficits in the context of the above medical diagnosis(es)? Yes Addressing deficits in the following areas: balance, endurance, locomotion, strength, transferring, bowel/bladder control, bathing, dressing, toileting, and psychosocial support Can the patient actively participate in an intensive therapy program of at least 3 hrs of therapy 5 days a week? Yes The potential for patient to make measurable gains while on inpatient rehab is excellent Anticipated functional  outcomes upon discharge from inpatient rehab: modified independent PT, modified independent OT, n/a SLP Estimated rehab length of stay to reach the above functional goals is: 5-7d Anticipated discharge destination: Home 10. Overall Rehab/Functional Prognosis: excellent   MD Signature: "I have personally performed a face to face diagnostic evaluation of this patient.  Additionally, I have reviewed and concur with the physician assistant's documentation above."

## 2021-01-18 NOTE — Progress Notes (Signed)
Physical Therapy Treatment Patient Details Name: Stanley [name]: [id number] DOB: 1946/06/30 Today's Date: 01/18/2021    History of Present Illness Pt is a 75 y/o male admitted 6/15 secondary to LUE and LLE weakness and decreased coordination. Found to have infarct in posterior basal ganglia/posterior limb  internal capsule on the right. PMH includes CAD s/p CABG, HTN, a fib, and CHF.    PT Comments    Patient progressing towards physical therapy goals. Patient ambulating 125' with minA+2 and HHAx2. Trialed gait training without AD with patient ring modA+2 for balance and safety. Patient continues to be limited by weakness, balance, and activity tolerance deficits. Continue to recommend comprehensive inpatient rehab (CIR) for post-acute therapy needs.     Follow Up Recommendations  CIR     Equipment Recommendations  Rolling [place] with 5" wheels;3in1 (PT)    Recommendations for Other Services       Precautions / Restrictions Precautions Precautions: Fall Precaution Comments: Had fall on saturday when he first had symptoms Restrictions Weight Bearing Restrictions: No    Mobility  Bed Mobility Overal bed mobility: Needs Assistance Bed Mobility: Supine to Sit;Sit to Supine     Supine to sit: Min guard Sit to supine: Min guard   General bed mobility comments: increased time and use of momentum to reach EOB    Transfers Overall transfer level: Needs assistance Equipment used: 2 person hand held assist;None Transfers: Sit to/from Stand Sit to Stand: Min assist;+2 physical assistance         General transfer comment: minA+2 to power up. Repeated sit to stands x10 with min guard  Ambulation/Gait Ambulation/Gait assistance: Min assist;+2 physical assistance;+2 safety/equipment;Mod assist Gait Distance (Feet): 125 Feet (x 20') Assistive device: 2 person hand held assist;None Gait Pattern/deviations: Step-through pattern;Ataxic;Decreased stride length Gait  velocity: Decreased   General Gait Details: Initially ambulating with HHAx2 with minA+2. When ambulating short distance with no AD, patient required modA for balance   Stairs             Wheelchair Mobility    Modified Rankin (Stroke Patients Only) Modified Rankin (Stroke Patients Only) Pre-Morbid Rankin Score: No symptoms Modified Rankin: Moderately severe disability     Balance Overall balance assessment: Needs assistance Sitting-balance support: No upper extremity supported;Feet supported Sitting balance-Leahy Scale: Fair     Standing balance support: Bilateral upper extremity supported;During functional activity Standing balance-Leahy Scale: Poor Standing balance comment: Reliant on BUE support                            Cognition Arousal/Alertness: Awake/alert Behavior During Therapy: WFL for tasks assessed/performed Overall Cognitive Status: History of cognitive impairments - at baseline                                 General Comments: Low knowledge base for healthcare issues. Poor awareness of deficits and impaired problem solving      Exercises General Exercises - Lower Extremity Hip Flexion/Marching: Both;10 reps;Standing Other Exercises Other Exercises: sit to stand x 10    General Comments        Pertinent Vitals/Pain Pain Assessment: No/denies pain    Home Living     Available Help at Discharge: Family Type of Home: House              Prior Function  PT Goals (current goals can now be found in the care plan section) Acute Rehab PT Goals Patient Stated Goal: to be independent PT Goal Formulation: With patient Time For Goal Achievement: 01/31/21 Potential to Achieve Goals: Good Progress towards PT goals: Progressing toward goals    Frequency    Min 4X/week      PT Plan Current plan remains appropriate    Co-evaluation              AM-PAC PT "6 Clicks" Mobility   Outcome  Measure  Help needed turning from your back to your side while in a flat bed without using bedrails?: A Little Help needed moving from lying on your back to sitting on the side of a flat bed without using bedrails?: A Little Help needed moving to and from a bed to a chair (including a wheelchair)?: A Little Help needed standing up from a chair using your arms (e.g., wheelchair or bedside chair)?: A Little Help needed to walk in hospital room?: Total Help needed climbing 3-5 steps with a railing? : Total 6 Click Score: 14    End of Session Equipment Utilized During Treatment: Gait belt Activity Tolerance: Patient tolerated treatment well Patient left: in bed;with call bell/phone within reach;with bed alarm set Nurse Communication: Mobility status PT Visit Diagnosis: Unsteadiness on feet (R26.81);Muscle weakness (generalized) (M62.81);Difficulty in walking, not elsewhere classified (R26.2)     Time: 4888-9169 PT Time Calculation (min) (ACUTE ONLY): 24 min  Charges:  $Gait Training: 8-22 mins $Therapeutic Exercise: 8-22 mins                     Veronnica Hennings A. Dan Humphreys PT, DPT Acute Rehabilitation Services Pager [phone] Office [phone]    Viviann Spare 01/18/2021, 5:19 PM

## 2021-01-19 ENCOUNTER — Encounter (HOSPITAL_COMMUNITY): Payer: Self-pay | Admitting: Physical Medicine and Rehabilitation

## 2021-01-19 ENCOUNTER — Other Ambulatory Visit: Payer: Self-pay

## 2021-01-19 ENCOUNTER — Inpatient Hospital Stay (HOSPITAL_COMMUNITY)
Admission: RE | Admit: 2021-01-19 | Discharge: 2021-01-25 | DRG: 057 | Disposition: A | Discharge status: Home / Self Care | Payer: Medicare Other | Source: Intra-hospital | Attending: Physical Medicine and Rehabilitation | Admitting: Physical Medicine and Rehabilitation

## 2021-01-19 DIAGNOSIS — I4891 Unspecified atrial fibrillation: Secondary | ICD-10-CM | POA: Diagnosis present

## 2021-01-19 DIAGNOSIS — I69311 Memory deficit following cerebral infarction: Secondary | ICD-10-CM

## 2021-01-19 DIAGNOSIS — I639 Cerebral infarction, unspecified: Secondary | ICD-10-CM | POA: Diagnosis present

## 2021-01-19 DIAGNOSIS — Z87891 Personal history of nicotine dependence: Secondary | ICD-10-CM

## 2021-01-19 DIAGNOSIS — I69318 Other symptoms and signs involving cognitive functions following cerebral infarction: Secondary | ICD-10-CM | POA: Diagnosis not present

## 2021-01-19 DIAGNOSIS — I69328 Other speech and language deficits following cerebral infarction: Secondary | ICD-10-CM | POA: Diagnosis not present

## 2021-01-19 DIAGNOSIS — G4733 Obstructive sleep apnea (adult) (pediatric): Secondary | ICD-10-CM | POA: Diagnosis present

## 2021-01-19 DIAGNOSIS — Z951 Presence of aortocoronary bypass graft: Secondary | ICD-10-CM

## 2021-01-19 DIAGNOSIS — I110 Hypertensive heart disease with heart failure: Secondary | ICD-10-CM | POA: Diagnosis present

## 2021-01-19 DIAGNOSIS — I5042 Chronic combined systolic (congestive) and diastolic (congestive) heart failure: Secondary | ICD-10-CM | POA: Diagnosis present

## 2021-01-19 DIAGNOSIS — Z79899 Other long term (current) drug therapy: Secondary | ICD-10-CM | POA: Diagnosis not present

## 2021-01-19 DIAGNOSIS — I2510 Atherosclerotic heart disease of native coronary artery without angina pectoris: Secondary | ICD-10-CM | POA: Diagnosis present

## 2021-01-19 DIAGNOSIS — R001 Bradycardia, unspecified: Secondary | ICD-10-CM | POA: Diagnosis present

## 2021-01-19 DIAGNOSIS — Z7901 Long term (current) use of anticoagulants: Secondary | ICD-10-CM

## 2021-01-19 DIAGNOSIS — I69354 Hemiplegia and hemiparesis following cerebral infarction affecting left non-dominant side: Principal | ICD-10-CM

## 2021-01-19 DIAGNOSIS — E119 Type 2 diabetes mellitus without complications: Secondary | ICD-10-CM | POA: Diagnosis present

## 2021-01-19 DIAGNOSIS — I10 Essential (primary) hypertension: Secondary | ICD-10-CM | POA: Diagnosis present

## 2021-01-19 LAB — GLUCOSE, CAPILLARY
Glucose-Capillary: 100 mg/dL — ABNORMAL HIGH (ref 70–99)
Glucose-Capillary: 148 mg/dL — ABNORMAL HIGH (ref 70–99)

## 2021-01-19 MED ORDER — APIXABAN 5 MG PO TABS
5.0000 mg | ORAL_TABLET | Freq: Two times a day (BID) | ORAL | Status: DC
  Administered 2021-01-19 – 2021-01-25 (×12): 5 mg via ORAL
  Filled 2021-01-19 (×12): qty 1

## 2021-01-19 MED ORDER — ROSUVASTATIN CALCIUM 20 MG PO TABS
40.0000 mg | ORAL_TABLET | Freq: Every day | ORAL | Status: DC
  Administered 2021-01-19 – 2021-01-24 (×6): 40 mg via ORAL
  Filled 2021-01-19 (×6): qty 2

## 2021-01-19 MED ORDER — ASPIRIN EC 81 MG PO TBEC
81.0000 mg | DELAYED_RELEASE_TABLET | Freq: Every day | ORAL | Status: DC
  Administered 2021-01-20 – 2021-01-25 (×6): 81 mg via ORAL
  Filled 2021-01-19 (×6): qty 1

## 2021-01-19 MED ORDER — METOPROLOL SUCCINATE ER 50 MG PO TB24
75.0000 mg | ORAL_TABLET | Freq: Two times a day (BID) | ORAL | Status: DC
  Administered 2021-01-19 – 2021-01-22 (×6): 75 mg via ORAL
  Filled 2021-01-19 (×6): qty 1

## 2021-01-19 MED ORDER — ALUM & MAG HYDROXIDE-SIMETH 200-200-20 MG/5ML PO SUSP: 30.0000 mL | ORAL | Status: DC | PRN

## 2021-01-19 MED ORDER — ISOSORBIDE MONONITRATE ER 30 MG PO TB24
30.0000 mg | ORAL_TABLET | Freq: Every day | ORAL | Status: DC
  Administered 2021-01-20 – 2021-01-25 (×6): 30 mg via ORAL
  Filled 2021-01-19 (×6): qty 1

## 2021-01-19 MED ORDER — INSULIN ASPART 100 UNIT/ML IJ SOLN: 0.0000 [IU] | Freq: Every day | INTRAMUSCULAR | Status: DC

## 2021-01-19 MED ORDER — GUAIFENESIN-DM 100-10 MG/5ML PO SYRP: 5.0000 mL | ORAL_SOLUTION | Freq: Four times a day (QID) | ORAL | Status: DC | PRN

## 2021-01-19 MED ORDER — INSULIN ASPART 100 UNIT/ML IJ SOLN: 0.0000 [IU] | Freq: Three times a day (TID) | INTRAMUSCULAR | Status: DC

## 2021-01-19 MED ORDER — DIPHENHYDRAMINE HCL 12.5 MG/5ML PO ELIX: 12.5000 mg | ORAL_SOLUTION | Freq: Four times a day (QID) | ORAL | Status: DC | PRN

## 2021-01-19 MED ORDER — TRAZODONE HCL 50 MG PO TABS
25.0000 mg | ORAL_TABLET | Freq: Every evening | ORAL | Status: DC | PRN
Start: 2021-01-19 — End: 2021-01-25

## 2021-01-19 MED ORDER — SACUBITRIL-VALSARTAN 97-103 MG PO TABS
1.0000 | ORAL_TABLET | Freq: Two times a day (BID) | ORAL | Status: DC
  Administered 2021-01-19 – 2021-01-25 (×12): 1 via ORAL
  Filled 2021-01-19 (×13): qty 1

## 2021-01-19 MED ORDER — ACETAMINOPHEN 325 MG PO TABS
325.0000 mg | ORAL_TABLET | ORAL | Status: DC | PRN
Start: 2021-01-19 — End: 2021-01-25

## 2021-01-19 MED ORDER — POLYETHYLENE GLYCOL 3350 17 G PO PACK: 17.0000 g | PACK | Freq: Every day | ORAL | Status: DC | PRN

## 2021-01-19 MED ORDER — BISACODYL 10 MG RE SUPP: 10.0000 mg | Freq: Every day | RECTAL | Status: DC | PRN

## 2021-01-19 MED ORDER — PROCHLORPERAZINE MALEATE 5 MG PO TABS: 5.0000 mg | ORAL_TABLET | Freq: Four times a day (QID) | ORAL | Status: DC | PRN

## 2021-01-19 MED ORDER — FUROSEMIDE 40 MG PO TABS: 40.0000 mg | ORAL_TABLET | ORAL | Status: DC | PRN

## 2021-01-19 MED ORDER — FLEET ENEMA 7-19 GM/118ML RE ENEM: 1.0000 | ENEMA | Freq: Once | RECTAL | Status: DC | PRN

## 2021-01-19 MED ORDER — ASPIRIN 81 MG PO TBEC: 81.0000 mg | DELAYED_RELEASE_TABLET | Freq: Every day | ORAL | 11 refills | Status: AC

## 2021-01-19 MED ORDER — PROCHLORPERAZINE 25 MG RE SUPP: 12.5000 mg | Freq: Four times a day (QID) | RECTAL | Status: DC | PRN

## 2021-01-19 MED ORDER — PROCHLORPERAZINE EDISYLATE 10 MG/2ML IJ SOLN
5.0000 mg | Freq: Four times a day (QID) | INTRAMUSCULAR | Status: DC | PRN
Start: 2021-01-19 — End: 2021-01-25

## 2021-01-19 NOTE — Discharge Summary (Signed)
Physician Discharge Summary  Stanley Wells:[id number] DOB: 08/14/1945 DOA: 01/16/2021  PCP: [name], MD  Admit date: 01/16/2021 Discharge date: 01/19/2021  Admitted From: Home Disposition:  CIR   Recommendations for Outpatient Follow-up:  Follow up with PCP in 1 week Follow up with neurology in 4 weeks  Discharge Condition: Stable CODE STATUS: DNR Diet recommendation: Heart healthy  Brief/Interim Summary: Stanley Wells is a 75 y.o. male with PMH of CAD, A. fib status post ablation still on Eliquis, CHF, HLD, HTN, OSA who presents for left arm and leg weakness. Reports this started on Saturday. Last known normal on Friday. Was camping over the weekend in [place] and woke up and immediately fell to the ground. Has been unable to bear weight. Reduced grip strength of left hand. Symptoms have been persistent since initially started without improvement. No slurred speech confusion headache nausea vomiting loss of consciousness vision changes neck pain.He states that he has never had a stroke before. MRI of the brain with a lacunar infarction of the posterior limb of the right internal capsule. Neurology consulted.  Patient underwent stroke work-up, PT OT SLP evaluation.  He was recommended for Eliquis, aspirin and Crestor.  PT OT recommended CIR placement.  Discharge Diagnoses:  Active Problems:   Chronic combined systolic and diastolic CHF (congestive heart failure) (HCC)   Coronary artery disease involving native coronary artery without angina pectoris   OSA (obstructive sleep apnea)   Essential hypertension   Long term current use of anticoagulant therapy - Eliquis   Persistent atrial fibrillation (HCC)   Hyperlipidemia   Hypertensive heart disease   S/P CABG x 5   CVA (cerebral vascular accident) (HCC)   Left-sided weakness   Acute CVA -Stroke team following, follow-up in stroke clinic in 4 weeks -Echocardiogram as below -Continue Eliquis, aspirin, Crestor -PT OT  recommending CIR admission  Essential hypertension -Outside of window for permissive hypertension -Continue Entresto, Imdur, metoprolol  Chronic systolic and diastolic heart failure -Does not appear to be overtly fluid overloaded at this time -Continue Entresto, Imdur, metoprolol.  Lasix is taken on as-needed basis at baseline  Chronic A. fib -Continue Eliquis  OSA -Continue CPAP nightly   AKI -Baseline Cr ~1-1.1 -Resolved   Diabetes mellitus -Hemoglobin A1c 6.7   Discharge Instructions  Discharge Instructions     Ambulatory referral to Neurology   Complete by: As directed    An appointment is requested in approximately: 4 weeks   Diet - low sodium heart healthy   Complete by: As directed    Increase activity slowly   Complete by: As directed       Allergies as of 01/19/2021   No Known Allergies      Medication List     TAKE these medications    acetaminophen 500 MG tablet Commonly known as: TYLENOL Take 500-1,000 mg by mouth every 8 (eight) hours as needed for mild pain.   apixaban 5 MG Tabs tablet Commonly known as: Eliquis TAKE 1 TABLET(5 MG) BY MOUTH TWICE DAILY What changed:  how much to take how to take this when to take this   aspirin 81 MG EC tablet Take 1 tablet (81 mg total) by mouth daily. Swallow whole. Start taking on: January 20, 2021   Entresto 97-103 MG Generic drug: sacubitril-valsartan TAKE 1 TABLET BY MOUTH TWICE DAILY   furosemide 40 MG tablet Commonly known as: Lasix Take 1 tablet (40 mg total) by mouth as needed.   isosorbide mononitrate  30 MG 24 hr tablet Commonly known as: IMDUR TAKE 1 TABLET(30 MG) BY MOUTH DAILY What changed: See the new instructions.   metoprolol succinate 50 MG 24 hr tablet Commonly known as: TOPROL-XL Take 1.5 tablets (75 mg total) by mouth 2 (two) times daily. Take with or immediately following a meal.   rosuvastatin 40 MG tablet Commonly known as: CRESTOR TAKE 1 TABLET(40 MG) BY MOUTH  DAILY What changed: See the new instructions.        Follow-up Information     [name], MD. Schedule an appointment as soon as possible for a visit in 1 week(s).   Specialty: Family Medicine Contact information: [address] [place] [address] [phone]         [facility]. Schedule an appointment as soon as possible for a visit in 1 month(s).   Contact information: [address]     [address] [place] [address] [phone]               No Known Allergies  Consultations: Neurology    Procedures/Studies: CT ANGIO HEAD W OR WO CONTRAST  Result Date: 01/17/2021 CLINICAL DATA:  Left upper and lower extremity weakness EXAM: CT ANGIOGRAPHY HEAD AND NECK TECHNIQUE: Multidetector CT imaging of the head and neck was performed using the standard protocol during bolus administration of intravenous contrast. Multiplanar CT image reconstructions and MIPs were obtained to evaluate the vascular anatomy. Carotid stenosis measurements (when applicable) are obtained utilizing NASCET criteria, using the distal internal carotid diameter as the denominator. CONTRAST:  63mL OMNIPAQUE IOHEXOL 350 MG/ML SOLN COMPARISON:  None. FINDINGS: CTA NECK FINDINGS SKELETON: There is no bony spinal canal stenosis. No lytic or blastic lesion. OTHER NECK: Normal pharynx, larynx and major salivary glands. No cervical lymphadenopathy. Unremarkable thyroid gland. UPPER CHEST: No pneumothorax or pleural effusion. No nodules or masses. AORTIC ARCH: There is no calcific atherosclerosis of the aortic arch. There is no aneurysm, dissection or hemodynamically significant stenosis of the visualized portion of the aorta. Conventional 3 vessel aortic branching pattern. The visualized proximal subclavian arteries are widely patent. RIGHT CAROTID SYSTEM: No dissection, occlusion or aneurysm. There is mixed density atherosclerosis extending into the proximal ICA,  resulting in less than 50% stenosis. LEFT CAROTID SYSTEM: No dissection, occlusion or aneurysm. There is mixed density atherosclerosis extending into the proximal ICA, resulting in less than 50% stenosis. VERTEBRAL ARTERIES: Left dominant configuration. Both origins are clearly patent. There is no dissection, occlusion or flow-limiting stenosis to the skull base (V1-V3 segments). CTA HEAD FINDINGS POSTERIOR CIRCULATION: --Vertebral arteries: Normal V4 segments. --Inferior cerebellar arteries: Normal. --Basilar artery: Normal. --Superior cerebellar arteries: Normal. --Posterior cerebral arteries (PCA): Normal. ANTERIOR CIRCULATION: --Intracranial internal carotid arteries: Normal. --Anterior cerebral arteries (ACA): Normal. Both A1 segments are present. Patent anterior communicating artery (a-comm). --Middle cerebral arteries (MCA): Normal. VENOUS SINUSES: As permitted by contrast timing, patent. ANATOMIC VARIANTS: None Review of the MIP images confirms the above findings. IMPRESSION: 1. No intracranial arterial occlusion or high-grade stenosis. 2. Bilateral carotid bifurcation atherosclerosis without hemodynamically significant stenosis by NASCET criteria. Electronically Signed   By: Deatra Robinson M.D.   On: 01/17/2021 01:02   CT Head Wo Contrast  Result Date: 01/16/2021 CLINICAL DATA:  Neural deficit. EXAM: CT HEAD WITHOUT CONTRAST TECHNIQUE: Contiguous axial images were obtained from the base of the skull through the vertex without intravenous contrast. COMPARISON:  None. FINDINGS: Brain: No evidence of acute hemorrhage, hydrocephalus, extra-axial collection or mass lesion/mass effect.  Advanced deep white matter microangiopathic changes. Moderate brain parenchymal volume loss. Possible age-indeterminate lacunar infarcts in the posterior aspect of the basal ganglia and bilateral thalami. Vascular: No hyperdense vessel or unexpected calcification. Skull: Normal. Negative for fracture or focal lesion.  Sinuses/Orbits: No acute finding. Other: None. IMPRESSION: 1. Possible age-indeterminate lacunar infarcts in the posterior aspects of the basal ganglia and bilateral thalami. 2. Advanced deep white matter microangiopathic changes. 3. Moderate brain parenchymal volume loss. Electronically Signed   By: Ted Mcalpine M.D.   On: 01/16/2021 14:05   CT ANGIO NECK W OR WO CONTRAST  Result Date: 01/17/2021 CLINICAL DATA:  Left upper and lower extremity weakness EXAM: CT ANGIOGRAPHY HEAD AND NECK TECHNIQUE: Multidetector CT imaging of the head and neck was performed using the standard protocol during bolus administration of intravenous contrast. Multiplanar CT image reconstructions and MIPs were obtained to evaluate the vascular anatomy. Carotid stenosis measurements (when applicable) are obtained utilizing NASCET criteria, using the distal internal carotid diameter as the denominator. CONTRAST:  50mL OMNIPAQUE IOHEXOL 350 MG/ML SOLN COMPARISON:  None. FINDINGS: CTA NECK FINDINGS SKELETON: There is no bony spinal canal stenosis. No lytic or blastic lesion. OTHER NECK: Normal pharynx, larynx and major salivary glands. No cervical lymphadenopathy. Unremarkable thyroid gland. UPPER CHEST: No pneumothorax or pleural effusion. No nodules or masses. AORTIC ARCH: There is no calcific atherosclerosis of the aortic arch. There is no aneurysm, dissection or hemodynamically significant stenosis of the visualized portion of the aorta. Conventional 3 vessel aortic branching pattern. The visualized proximal subclavian arteries are widely patent. RIGHT CAROTID SYSTEM: No dissection, occlusion or aneurysm. There is mixed density atherosclerosis extending into the proximal ICA, resulting in less than 50% stenosis. LEFT CAROTID SYSTEM: No dissection, occlusion or aneurysm. There is mixed density atherosclerosis extending into the proximal ICA, resulting in less than 50% stenosis. VERTEBRAL ARTERIES: Left dominant configuration. Both  origins are clearly patent. There is no dissection, occlusion or flow-limiting stenosis to the skull base (V1-V3 segments). CTA HEAD FINDINGS POSTERIOR CIRCULATION: --Vertebral arteries: Normal V4 segments. --Inferior cerebellar arteries: Normal. --Basilar artery: Normal. --Superior cerebellar arteries: Normal. --Posterior cerebral arteries (PCA): Normal. ANTERIOR CIRCULATION: --Intracranial internal carotid arteries: Normal. --Anterior cerebral arteries (ACA): Normal. Both A1 segments are present. Patent anterior communicating artery (a-comm). --Middle cerebral arteries (MCA): Normal. VENOUS SINUSES: As permitted by contrast timing, patent. ANATOMIC VARIANTS: None Review of the MIP images confirms the above findings. IMPRESSION: 1. No intracranial arterial occlusion or high-grade stenosis. 2. Bilateral carotid bifurcation atherosclerosis without hemodynamically significant stenosis by NASCET criteria. Electronically Signed   By: Deatra Robinson M.D.   On: 01/17/2021 01:02   MR BRAIN WO CONTRAST  Result Date: 01/16/2021 CLINICAL DATA:  Acute stroke presentation. Specific symptoms not described. EXAM: MRI HEAD WITHOUT CONTRAST TECHNIQUE: Multiplanar, multiecho pulse sequences of the brain and surrounding structures were obtained without intravenous contrast. COMPARISON:  Head CT earlier same day. FINDINGS: Brain: Diffusion imaging shows a 1 cm acute infarction in the posterior basal ganglia/posterior limb internal capsule on the right. No other acute infarction. Chronic small-vessel ischemic changes affect pons. Few old small vessel cerebellar infarctions with hemosiderin deposition. Old small vessel infarctions of the thalami. Chronic small-vessel ischemic changes throughout the deep and subcortical white matter. No large vessel territory infarction. Few scattered foci hemosiderin deposition within the deep hemispheres as well. No mass, hydrocephalus or extra-axial collection. Vascular: Major vessels at the base  of the brain show flow. Skull and upper cervical spine: Negative Sinuses/Orbits: Clear/normal Other: None IMPRESSION:  1 cm acute infarction at the posterior basal ganglia/posterior limb internal capsule on the right. Extensive chronic small-vessel ischemic changes elsewhere throughout the brain as described above, several showing hemosiderin deposition related to old microhemorrhage. Electronically Signed   By: Paulina Fusi M.D.   On: 01/16/2021 20:51   ECHOCARDIOGRAM COMPLETE  Result Date: 01/18/2021    ECHOCARDIOGRAM REPORT   Patient Name:   Stanley Wells Date of Exam: 01/18/2021 Medical Rec #:  [id number]         Height:       71.0 in Accession #:    [id number]        Weight:       228.6 lb Date of Birth:  [birth year]          BSA:          2.232 m Patient Age:    75 years          BP:           160/107 mmHg Patient Gender: M                 HR:           60 bpm. Exam Location:  Inpatient Procedure: 2D Echo, Cardiac Doppler, Color Doppler and Intracardiac            Opacification Agent Indications:    Stroke  History:        Patient has prior history of Echocardiogram examinations, most                 recent 03/12/2020. CHF, CAD, Prior CABG, Arrythmias:Atrial                 Fibrillation; Risk Factors:Dyslipidemia, Hypertension, Sleep                 Apnea and Former Smoker. S/p ablation.  Sonographer:    Ross Ludwig RDCS (AE) Referring Phys: [id number] [place] IMPRESSIONS  1. Left ventricular ejection fraction, by estimation, is 60 to 65%. The left ventricle has normal function. The left ventricle has no regional wall motion abnormalities. There is severe concentric left ventricular hypertrophy. Left ventricular diastolic  parameters are indeterminate.  2. Right ventricular systolic function is normal. The right ventricular size is normal.  3. Left atrial size was mildly dilated.  4. Right atrial size was moderately dilated.  5. The mitral valve is normal in structure. No evidence of mitral valve  regurgitation. No evidence of mitral stenosis.  6. The aortic valve is normal in structure. Aortic valve regurgitation is not visualized. No aortic stenosis is present.  7. Aortic dilatation noted. FINDINGS  Left Ventricle: Left ventricular ejection fraction, by estimation, is 60 to 65%. The left ventricle has normal function. The left ventricle has no regional wall motion abnormalities. Definity contrast agent was given IV to delineate the left ventricular  endocardial borders. The left ventricular internal cavity size was small. There is severe concentric left ventricular hypertrophy. Left ventricular diastolic parameters are indeterminate. Right Ventricle: The right ventricular size is normal. Right vetricular wall thickness was not well visualized. Right ventricular systolic function is normal. Left Atrium: Left atrial size was mildly dilated. Right Atrium: Right atrial size was moderately dilated. Pericardium: There is no evidence of pericardial effusion. Mitral Valve: The mitral valve is normal in structure. No evidence of mitral valve regurgitation. No evidence of mitral valve stenosis. Tricuspid Valve: The tricuspid valve is grossly normal. Tricuspid valve regurgitation is not demonstrated. Aortic Valve:  The aortic valve is normal in structure. Aortic valve regurgitation is not visualized. No aortic stenosis is present. Aortic valve mean gradient measures 2.0 mmHg. Aortic valve peak gradient measures 4.0 mmHg. Aortic valve area, by VTI measures 3.47 cm. Pulmonic Valve: The pulmonic valve was grossly normal. Pulmonic valve regurgitation is not visualized. Aorta: Aortic dilatation noted. IAS/Shunts: The atrial septum is grossly normal.  LEFT VENTRICLE PLAX 2D LVIDd:         5.30 cm  Diastology LVIDs:         4.10 cm  LV e' medial:    6.74 cm/s LV PW:         1.70 cm  LV E/e' medial:  9.2 LV IVS:        1.50 cm  LV e' lateral:   7.51 cm/s LVOT diam:     2.40 cm  LV E/e' lateral: 8.3 LV SV:         74 LV SV  Index:   33 LVOT Area:     4.52 cm  RIGHT VENTRICLE            IVC RV Basal diam:  3.30 cm    IVC diam: 1.00 cm RV S prime:     8.81 cm/s TAPSE (M-mode): 1.7 cm LEFT ATRIUM              Index       RIGHT ATRIUM           Index LA diam:        5.10 cm  2.29 cm/m  RA Area:     25.80 cm LA Vol (A2C):   65.5 ml  29.35 ml/m RA Volume:   78.20 ml  35.04 ml/m LA Vol (A4C):   104.0 ml 46.60 ml/m LA Biplane Vol: 88.0 ml  39.43 ml/m  AORTIC VALVE AV Area (Vmax):    3.97 cm AV Area (Vmean):   3.63 cm AV Area (VTI):     3.47 cm AV Vmax:           100.00 cm/s AV Vmean:          68.800 cm/s AV VTI:            0.214 m AV Peak Grad:      4.0 mmHg AV Mean Grad:      2.0 mmHg LVOT Vmax:         87.70 cm/s LVOT Vmean:        55.200 cm/s LVOT VTI:          0.164 m LVOT/AV VTI ratio: 0.77  AORTA Ao Root diam: 3.70 cm Ao Asc diam:  3.40 cm MITRAL VALVE MV Area (PHT): 2.02 cm    SHUNTS MV Decel Time: 375 msec    Systemic VTI:  0.16 m MV E velocity: 62.00 cm/s  Systemic Diam: 2.40 cm MV A velocity: 39.40 cm/s MV E/A ratio:  1.57 Kristeen Miss MD Electronically signed by Kristeen Miss MD Signature Date/Time: 01/18/2021/4:32:17 PM    Final        Discharge Exam: Vitals:   01/19/21 0429 01/19/21 0950  BP: (!) 152/94 (!) 154/94  Pulse: (!) 51 62  Resp: 16   Temp: 97.8 F (36.6 C) 98.3 F (36.8 C)  SpO2: 98%     General: Pt is alert, awake, not in acute distress Cardiovascular: RRR, S1/S2 +, no edema Respiratory: CTA bilaterally, no wheezing, no rhonchi, no respiratory distress, no conversational dyspnea  Abdominal: Soft, NT, ND, bowel sounds +  Extremities: no edema, no cyanosis Psych: Normal mood and affect, stable judgement and insight     The results of significant diagnostics from this hospitalization (including imaging, microbiology, ancillary and laboratory) are listed below for reference.     Microbiology: Recent Results (from the past 240 hour(s))  SARS CORONAVIRUS 2 (TAT 6-24 HRS) Nasopharyngeal  Nasopharyngeal Swab     Status: None   Collection Time: 01/17/21  8:25 AM   Specimen: Nasopharyngeal Swab  Result Value Ref Range Status   SARS Coronavirus 2 NEGATIVE NEGATIVE Final    Comment: (NOTE) SARS-CoV-2 target nucleic acids are NOT DETECTED.  The SARS-CoV-2 RNA is generally detectable in upper and lower respiratory specimens during the acute phase of infection. Negative results do not preclude SARS-CoV-2 infection, do not rule out co-infections with other pathogens, and should not be used as the sole basis for treatment or other patient management decisions. Negative results must be combined with clinical observations, patient history, and epidemiological information. The expected result is Negative.  Fact Sheet for Patients: HairSlick.nohttps://www.fda.gov/media/138098/download  Fact Sheet for Healthcare Providers: quierodirigir.comhttps://www.fda.gov/media/138095/download  This test is not yet approved or cleared by the [place] FDA and  has been authorized for detection and/or diagnosis of SARS-CoV-2 by FDA under an Emergency Use Authorization (EUA). This EUA will remain  in effect (meaning this test can be used) for the duration of the COVID-19 declaration under Se ction 564(b)(1) of the Act, 21 U.S.C. section 360bbb-3(b)(1), unless the authorization is terminated or revoked sooner.  Performed at [facility], [address] [place], [place] [address]      Labs: BNP (last 3 results) Recent Labs    04/17/20 0939  BNP 174.9*   Basic Metabolic Panel: Recent Labs  Lab 01/14/21 0813 01/16/21 1320  NA 137 140  K 3.5 3.9  CL 106 109  CO2 24 24  GLUCOSE 106* 185*  BUN 35* 19  CREATININE 1.51* 1.22  CALCIUM 9.0 8.6*   Liver Function Tests: Recent Labs  Lab 01/14/21 0813 01/16/21 1320  AST 26 27  ALT 23 30  ALKPHOS 68 65  BILITOT 1.1 0.9  PROT 6.1* 6.0*  ALBUMIN 3.4* 3.5   No results for input(s): LIPASE, AMYLASE in the last 168 hours. No results for input(s):  AMMONIA in the last 168 hours. CBC: Recent Labs  Lab 01/14/21 0813 01/16/21 1320  WBC 10.4 7.3  NEUTROABS 6.9 4.9  HGB 13.9 14.1  HCT 44.1 43.3  MCV 95.9 94.5  PLT 232 202   Cardiac Enzymes: No results for input(s): CKTOTAL, CKMB, CKMBINDEX, TROPONINI in the last 168 hours. BNP: Invalid input(s): POCBNP CBG: No results for input(s): GLUCAP in the last 168 hours. D-Dimer No results for input(s): DDIMER in the last 72 hours. Hgb A1c Recent Labs    01/17/21 0344  HGBA1C 6.7*   Lipid Profile Recent Labs    01/17/21 0344  CHOL 107  HDL 33*  LDLCALC 41  TRIG 604167*  CHOLHDL 3.2   Thyroid function studies No results for input(s): TSH, T4TOTAL, T3FREE, THYROIDAB in the last 72 hours.  Invalid input(s): FREET3 Anemia work up No results for input(s): VITAMINB12, FOLATE, FERRITIN, TIBC, IRON, RETICCTPCT in the last 72 hours. Urinalysis    Component Value Date/Time   COLORURINE YELLOW 01/16/2021 1320   APPEARANCEUR HAZY (A) 01/16/2021 1320   LABSPEC 1.025 01/16/2021 1320   PHURINE 5.0 01/16/2021 1320   GLUCOSEU 50 (A) 01/16/2021 1320   HGBUR NEGATIVE 01/16/2021 1320   BILIRUBINUR  NEGATIVE 01/16/2021 1320   KETONESUR NEGATIVE 01/16/2021 1320   PROTEINUR 30 (A) 01/16/2021 1320   UROBILINOGEN 0.2 05/29/2014 1505   NITRITE NEGATIVE 01/16/2021 1320   LEUKOCYTESUR SMALL (A) 01/16/2021 1320   Sepsis Labs Invalid input(s): PROCALCITONIN,  WBC,  LACTICIDVEN Microbiology Recent Results (from the past 240 hour(s))  SARS CORONAVIRUS 2 (TAT 6-24 HRS) Nasopharyngeal Nasopharyngeal Swab     Status: None   Collection Time: 01/17/21  8:25 AM   Specimen: Nasopharyngeal Swab  Result Value Ref Range Status   SARS Coronavirus 2 NEGATIVE NEGATIVE Final    Comment: (NOTE) SARS-CoV-2 target nucleic acids are NOT DETECTED.  The SARS-CoV-2 RNA is generally detectable in upper and lower respiratory specimens during the acute phase of infection. Negative results do not preclude  SARS-CoV-2 infection, do not rule out co-infections with other pathogens, and should not be used as the sole basis for treatment or other patient management decisions. Negative results must be combined with clinical observations, patient history, and epidemiological information. The expected result is Negative.  Fact Sheet for Patients: HairSlick.no  Fact Sheet for Healthcare Providers: quierodirigir.com  This test is not yet approved or cleared by the [place] FDA and  has been authorized for detection and/or diagnosis of SARS-CoV-2 by FDA under an Emergency Use Authorization (EUA). This EUA will remain  in effect (meaning this test can be used) for the duration of the COVID-19 declaration under Se ction 564(b)(1) of the Act, 21 U.S.C. section 360bbb-3(b)(1), unless the authorization is terminated or revoked sooner.  Performed at [facility], [address] [place], [place] [address]      Patient was seen and examined on the day of discharge and was found to be in stable condition. Time coordinating discharge: 20 minutes including assessment and coordination of care, as well as examination of the patient.   SIGNED:  Noralee Stain, DO [facility] 01/19/2021, 10:25 AM

## 2021-01-19 NOTE — Progress Notes (Signed)
Patient moved to CIR

## 2021-01-19 NOTE — H&P (Signed)
Physical Medicine and Rehabilitation Admission H&P        Chief Complaint  Patient presents with   Functional deficits due to stroke.       HPI: Stanley Wells. Labrum is a 75 year old male with history of CAD, HTN, Chronic combined CHF, Afib/Aflutter who was admitted on 01/16/21 with left-sided weakness with sensory deficits that started while on a camping trip on 01/11/2021.  He had difficulty walking as well as tendency dropping things from his left hand he was seen in the ED 06/13 but left without evaluation.  He returned to the ED for work up on 01/16/21 .  UDS negative.  CT head done showing possible age indeterminate lacunar infarcts as well as advanced deep white matter microangiopathic changes and moderate parenchymal brain volume loss.  MRI brain done showing 1 cm acute infarct in posterior basal ganglia/posterior limb internal capsule on right as well as evidence of old microhemorrhages with hemosiderin deposits. CTA head/neck was negative for occlusion or high-grade stenosis.     No tPA given as out of window for treatment and Dr. Roda Shutters recommended addition of low-dose ASA for stroke prevention.  Symptoms improving and risk factor management recommended by neurology. 2D echo done today and pending.  Therapy evaluations completed on 06/16 revealing unsteady gait with balance deficits, tendency for LLE to buckle, kyphotic posture, poor awareness of deficits as well as reports of impaired problem-solving.  CIR recommended due to functional decline.     Review of Systems Constitutional:  Negative for chills and fever. HENT:  Negative for hearing loss and tinnitus.   Eyes:  Negative for blurred vision and double vision. Respiratory:  Negative for cough, shortness of breath and stridor.   Gastrointestinal:  Negative for abdominal pain, constipation, heartburn and nausea. Genitourinary:  Negative for dysuria and urgency. Musculoskeletal:  Negative for myalgias. Skin:  Negative for itching and  rash. Neurological:  Positive for weakness. Negative for dizziness and headaches. Psychiatric/Behavioral:  The patient is not nervous/anxious and does not have insomnia.           Past Medical History:  Diagnosis Date   Atrial flutter with rapid ventricular response (HCC) 07/23/2018   CAD (coronary artery disease)      a. s/p CABG 2003  b. s/p acute MI prior to CABG w/ EF <30%   Cardiomyopathy, ischemic: EF 20-25%     Cholecystitis     Chronic combined systolic and diastolic CHF (congestive heart failure) (HCC)     Chronic systolic CHF (congestive heart failure) (HCC)      a. 2D ECHO: 05/17/2014: EF 30-35%;  b. 07/2015 TEE: EF 20-25%, diff HK, sev MR, sev dil LA w/o thrombus, mod reduced RV fxn, mod dil RA, mod TR.   Hyperlipidemia     Hypertensive heart disease     Kidney stones X 1    "passed it" (08/02/2015)   OSA (obstructive sleep apnea) 10/11/2014    refuses to use CPAP   Persistent atrial fibrillation (HCC)      a. s/p succesful TEE/DCCV on 06/01/14.  b. on Eliquis;  c. 07/2015 recurrent AF-->Failed DCCV.   S/P CABG x 5 02/16/2002    LIMA to LAD, SVG to D1, SVG to OM2, Sequential SVG to PDA-RPL, saphenous vein harvest from right thigh and lower leg   Severe mitral regurgitation      Functional, improved with improvement in his EF.   Tricuspid regurgitation  Past Surgical History:  Procedure Laterality Date   ATRIAL FIBRILLATION ABLATION N/A 04/11/2020    Procedure: ATRIAL FIBRILLATION ABLATION;  Surgeon: Lanier Prude, MD;  Location: [facility] INVASIVE CV LAB;  Service: Cardiovascular;  Laterality: N/A;   CARDIAC CATHETERIZATION N/A 06/19/2014    Procedure: RIGHT/LEFT HEART CATH AND CORONARY/GRAFT ANGIOGRAPHY;  Surgeon: Lennette Bihari, MD;  Location: [facility] CATH LAB;  Service: Cardiovascular;  Laterality: N/A;   CARDIAC CATHETERIZATION   2003   CARDIAC CATHETERIZATION N/A 08/28/2015    Procedure: Right Heart Cath and Coronary/Graft Angiography;  Surgeon: Laurey Morale,  MD;  Location: [facility] INVASIVE CV LAB;  Service: Cardiovascular;  Laterality: N/A;   CARDIOVERSION N/A 06/01/2014    Procedure: CARDIOVERSION;  Surgeon: Lars Masson, MD;  Location: [facility] ENDOSCOPY;  Service: Cardiovascular;  Laterality: N/A;   CARDIOVERSION   06/11/2015; 08/02/2015   CARDIOVERSION N/A 08/02/2015    Procedure: CARDIOVERSION;  Surgeon: Wendall Stade, MD;  Location: [facility] ENDOSCOPY;  Service: Cardiovascular;  Laterality: N/A;   CARDIOVERSION N/A 10/22/2015    Procedure: CARDIOVERSION;  Surgeon: Laurey Morale, MD;  Location: [facility] ENDOSCOPY;  Service: Cardiovascular;  Laterality: N/A;   CARDIOVERSION N/A 03/15/2020    Procedure: CARDIOVERSION;  Surgeon: Laurey Morale, MD;  Location: [facility] ENDOSCOPY;  Service: Cardiovascular;  Laterality: N/A;   CARDIOVERSION N/A 05/14/2020    Procedure: CARDIOVERSION;  Surgeon: [name], MD;  Location: [facility] ENDOSCOPY;  Service: Cardiovascular;  Laterality: N/A;   CORONARY ARTERY BYPASS GRAFT   02/16/2002    CABG X5   LAPAROSCOPIC APPENDECTOMY N/A 07/20/2016    Procedure: APPENDECTOMY LAPAROSCOPIC;  Surgeon: Manus Rudd, MD;  Location: [facility] OR;  Service: General;  Laterality: N/A;   TEE WITHOUT CARDIOVERSION N/A 06/01/2014    Procedure: TRANSESOPHAGEAL ECHOCARDIOGRAM (TEE);  Surgeon: Lars Masson, MD;  Location: [facility] ENDOSCOPY;  Service: Cardiovascular;  Laterality: N/A;   TEE WITHOUT CARDIOVERSION N/A 08/03/2015    Procedure: TRANSESOPHAGEAL ECHOCARDIOGRAM (TEE);  Surgeon: Lars Masson, MD;  Location: [facility] ENDOSCOPY;  Service: Cardiovascular;  Laterality: N/A;           Family History  Problem Relation Age of Onset   Cancer Mother     Cancer Father     Hypertension Neg Hx     Stroke Neg Hx        Social History: Married. Worked for [employer]. He  reports that he quit smoking about 18 years ago. His smoking use included cigarettes. He has a 40.00 pack-year smoking history. He has never used smokeless tobacco. He reports  current alcohol use--beer once every 6 months. . He reports that he does not use drugs.     Allergies: No Known Allergies           Medications Prior to Admission  Medication Sig Dispense Refill   acetaminophen (TYLENOL) 500 MG tablet Take 500-1,000 mg by mouth every 8 (eight) hours as needed for mild pain.       apixaban (ELIQUIS) 5 MG TABS tablet TAKE 1 TABLET(5 MG) BY MOUTH TWICE DAILY (Patient taking differently: Take 5 mg by mouth 2 (two) times daily. TAKE 1 TABLET(5 MG) BY MOUTH TWICE DAILY) 180 tablet 3   ENTRESTO 97-103 MG TAKE 1 TABLET BY MOUTH TWICE DAILY 60 tablet 11   isosorbide mononitrate (IMDUR) 30 MG 24 hr tablet TAKE 1 TABLET(30 MG) BY MOUTH DAILY (Patient taking differently: Take 30 mg by mouth every morning.) 90 tablet 1   metoprolol succinate (TOPROL-XL) 50  MG 24 hr tablet Take 1.5 tablets (75 mg total) by mouth 2 (two) times daily. Take with or immediately following a meal. 270 tablet 3   rosuvastatin (CRESTOR) 40 MG tablet TAKE 1 TABLET(40 MG) BY MOUTH DAILY (Patient taking differently: Take 40 mg by mouth at bedtime.) 90 tablet 3   furosemide (LASIX) 40 MG tablet Take 1 tablet (40 mg total) by mouth as needed. (Patient not taking: No sig reported) 30 tablet 11      Drug Regimen Review  Drug regimen was reviewed and remains appropriate with no significant issues identified   Home: Home Living Family/patient expects to be discharged to:: Private residence Living Arrangements: Spouse/significant other, Parent Available Help at Discharge: Family Type of Home: House Home Access: Stairs to enter Secretary/administrator of Steps: 4 Entrance Stairs-Rails: Right Home Layout: One level Bathroom Shower/Tub: Health visitor: Standard Home Equipment: None   Functional History: Prior Function Level of Independence: Independent Comments: Still driving.  Has been moving minimally since onset of symptoms   Functional Status:  Mobility: Bed  Mobility Overal bed mobility: Needs Assistance Bed Mobility: Supine to Sit, Sit to Supine Supine to sit: Min assist Sit to supine: Min assist General bed mobility comments: Required assist for trunk and LE assist. Transfers Overall transfer level: Needs assistance Equipment used: 2 person hand held assist Transfers: Sit to/from Stand Sit to Stand: Min assist, +2 physical assistance General transfer comment: Min A + 2 for lift assist Ambulation/Gait Ambulation/Gait assistance: Min assist, Mod assist, +2 physical assistance Gait Distance (Feet): 50 Feet Assistive device: 2 person hand held assist Gait Pattern/deviations: Step-through pattern, Ataxic, Decreased stride length General Gait Details: Pt initially requiring min A +2, however, as gait progressed, had increased unsteadiness requiring heavy mod A  +2 for steadying. Functional weakness noted in LLE and LLE began to buckle on pt. Gait velocity: Decreased   ADL: ADL Overall ADL's : Needs assistance/impaired Eating/Feeding: Modified independent, Sitting, Bed level Grooming: Wash/dry hands, Oral care, Wash/dry face, Brushing hair, Minimal assistance, Standing Upper Body Bathing: Set up, Sitting Lower Body Bathing: Moderate assistance, Sit to/from stand Upper Body Dressing : Set up, Supervision/safety, Sitting Lower Body Dressing: Moderate assistance, Sit to/from stand Toilet Transfer: Moderate assistance, Ambulation, Comfort height toilet, RW Toileting- Clothing Manipulation and Hygiene: Moderate assistance, Sit to/from stand Functional mobility during ADLs: Moderate assistance, +2 for safety/equipment, +2 for physical assistance   Cognition: Cognition Overall Cognitive Status: No family/caregiver present to determine baseline cognitive functioning Cognition Arousal/Alertness: Awake/alert Behavior During Therapy: WFL for tasks assessed/performed Overall Cognitive Status: No family/caregiver present to determine baseline  cognitive functioning General Comments: Low knowledge base for healthcare issues. Likely close to baseline.  He demontrates poor awareness of deficits and impaired problem solving   Blood pressure (!) 160/107, pulse 60, temperature 97.8 F (36.6 C), temperature source Oral, resp. rate 18, SpO2 100 %. Physical Exam Vitals and nursing note reviewed. Constitutional:      Appearance: Normal appearance. Musculoskeletal:        General: No swelling. Neurological:    Mental Status: He is alert and oriented to person, place, and time.    Comments: Speech clear. Able to follow simple motor commands without difficulty. Mild sensory deficits on the left.     General: No acute distress Mood and affect are appropriate Heart: Regular rate and rhythm no rubs murmurs or extra sounds Lungs: Clear to auscultation, breathing unlabored, no rales or wheezes Abdomen: Positive bowel sounds, soft nontender to palpation, nondistended  Extremities: No clubbing, cyanosis, or edema Skin: No evidence of breakdown, no evidence of rash Neurologic: Cranial nerves II through XII intact, motor strength is 5/5 in bilateral deltoid, bicep, tricep, grip, hip flexor, knee extensors, ankle dorsiflexor and plantar flexor Sensory exam normal sensation to light touch and proprioception in bilateral upper and lower extremities Cerebellar exam dysmetria with left finger-nose-finger, intact on the right side, intact heel-to-shin bilaterally Musculoskeletal: Full range of motion in all 4 extremities. No joint swelling    Lab Results Last 48 Hours        Results for orders placed or performed during the hospital encounter of 01/16/21 (from the past 48 hour(s))  Ethanol     Status: None    Collection Time: 01/16/21  1:20 PM  Result Value Ref Range    Alcohol, Ethyl (B) <10 <10 mg/dL      Comment: (NOTE) Lowest detectable limit for serum alcohol is 10 mg/dL.   For medical purposes only. Performed at [facility],  [address], [place], [place] [address]    Protime-INR     Status: None    Collection Time: 01/16/21  1:20 PM  Result Value Ref Range    Prothrombin Time 14.4 11.4 - 15.2 seconds    INR 1.1 0.8 - 1.2      Comment: (NOTE) INR goal varies based on device and disease states. Performed at [facility], [address] [place], [place] [address]    APTT     Status: None    Collection Time: 01/16/21  1:20 PM  Result Value Ref Range    aPTT 31 24 - 36 seconds      Comment: Performed at [facility], [address], [place], [place] [address]  CBC     Status: None    Collection Time: 01/16/21  1:20 PM  Result Value Ref Range    WBC 7.3 4.0 - 10.5 K/uL    RBC 4.58 4.22 - 5.81 MIL/uL    Hemoglobin 14.1 13.0 - 17.0 g/dL    HCT 91.4 78.2 - 95.6 %    MCV 94.5 80.0 - 100.0 fL    MCH 30.8 26.0 - 34.0 pg    MCHC 32.6 30.0 - 36.0 g/dL    RDW 21.3 08.6 - 57.8 %    Platelets 202 150 - 400 K/uL    nRBC 0.0 0.0 - 0.2 %      Comment: Performed at [facility], [address] [place], [place] [address]  Differential     Status: None    Collection Time: 01/16/21  1:20 PM  Result Value Ref Range    Neutrophils Relative % 67 %    Neutro Abs 4.9 1.7 - 7.7 K/uL    Lymphocytes Relative 22 %    Lymphs Abs 1.6 0.7 - 4.0 K/uL    Monocytes Relative 7 %    Monocytes Absolute 0.5 0.1 - 1.0 K/uL    Eosinophils Relative 2 %    Eosinophils Absolute 0.1 0.0 - 0.5 K/uL    Basophils Relative 1 %    Basophils Absolute 0.1 0.0 - 0.1 K/uL    Immature Granulocytes 1 %    Abs Immature Granulocytes 0.06 0.00 - 0.07 K/uL      Comment: Performed at [facility], [address] [place], [place] [address]  Comprehensive metabolic panel     Status: Abnormal    Collection Time: 01/16/21  1:20 PM  Result Value Ref Range    Sodium 140 135 - 145 mmol/L    Potassium 3.9 3.5 - 5.1 mmol/L    Chloride 109 98 - 111 mmol/L    CO2 24 22 - 32 mmol/L    Glucose, Bld 185 (H) 70 - 99 mg/dL      Comment:  Glucose reference range applies only to samples taken after fasting for at least 8 hours.    BUN 19 8 - 23 mg/dL    Creatinine, Ser 7.821.22 0.61 - 1.24 mg/dL    Calcium 8.6 (L) 8.9 - 10.3 mg/dL    Total Protein 6.0 (L) 6.5 - 8.1 g/dL    Albumin 3.5 3.5 - 5.0 g/dL    AST 27 15 - 41 U/L    ALT 30 0 - 44 U/L    Alkaline Phosphatase 65 38 - 126 U/L    Total Bilirubin 0.9 0.3 - 1.2 mg/dL    GFR, Estimated >95>60 >62>60 mL/min      Comment: (NOTE) Calculated using the CKD-EPI Creatinine Equation (2021)      Anion gap 7 5 - 15      Comment: Performed at [facility], [address] [place], [place] [address]  Urine rapid drug screen (hosp performed)     Status: None    Collection Time: 01/16/21  1:20 PM  Result Value Ref Range    Opiates NONE DETECTED NONE DETECTED    Cocaine NONE DETECTED NONE DETECTED    Benzodiazepines NONE DETECTED NONE DETECTED    Amphetamines NONE DETECTED NONE DETECTED    Tetrahydrocannabinol NONE DETECTED NONE DETECTED    Barbiturates NONE DETECTED NONE DETECTED      Comment: (NOTE) DRUG SCREEN FOR MEDICAL PURPOSES ONLY.  IF CONFIRMATION IS NEEDED FOR ANY PURPOSE, NOTIFY LAB WITHIN 5 DAYS.   LOWEST DETECTABLE LIMITS FOR URINE DRUG SCREEN Drug Class                     Cutoff (ng/mL) Amphetamine and metabolites    1000 Barbiturate and metabolites    200 Benzodiazepine                 200 Tricyclics and metabolites     300 Opiates and metabolites        300 Cocaine and metabolites        300 THC                            50 Performed at [facility], [address] [place], [place] [address]    Urinalysis, Routine w reflex microscopic     Status: Abnormal    Collection Time: 01/16/21  1:20 PM  Result Value Ref Range    Color, Urine YELLOW YELLOW    APPearance HAZY (A) CLEAR    Specific Gravity, Urine 1.025 1.005 - 1.030    pH 5.0 5.0 - 8.0    Glucose, UA 50 (A) NEGATIVE mg/dL    Hgb urine dipstick NEGATIVE NEGATIVE    Bilirubin Urine NEGATIVE  NEGATIVE    Ketones, ur NEGATIVE NEGATIVE mg/dL    Protein, ur 30 (A) NEGATIVE mg/dL    Nitrite NEGATIVE NEGATIVE    Leukocytes,Ua SMALL (A) NEGATIVE    RBC / HPF 0-5 0 - 5 RBC/hpf    WBC, UA 11-20 0 - 5 WBC/hpf    Bacteria, UA RARE (A) NONE SEEN    Squamous Epithelial /  LPF 0-5 0 - 5    Mucus PRESENT        Comment: Performed at [facility], [address], [place], [place] [address]  Hemoglobin A1c     Status: Abnormal    Collection Time: 01/17/21  3:44 AM  Result Value Ref Range    Hgb A1c MFr Bld 6.7 (H) 4.8 - 5.6 %      Comment: (NOTE)         Prediabetes: 5.7 - 6.4         Diabetes: >6.4         Glycemic control for adults with diabetes: <7.0      Mean Plasma Glucose 146 mg/dL      Comment: (NOTE) Performed At: [facility]  [address] [place] [address] [place], [place] [id number] Jolene Schimke MD AO:[id number]    Lipid panel     Status: Abnormal    Collection Time: 01/17/21  3:44 AM  Result Value Ref Range    Cholesterol 107 0 - 200 mg/dL    Triglycerides 962 (H) <150 mg/dL    HDL 33 (L) >95 mg/dL    Total CHOL/HDL Ratio 3.2 RATIO    VLDL 33 0 - 40 mg/dL    LDL Cholesterol 41 0 - 99 mg/dL      Comment:        Total Cholesterol/HDL:CHD Risk Coronary Heart Disease Risk Table                     Men   Women  1/2 Average Risk   3.4   3.3  Average Risk       5.0   4.4  2 X Average Risk   9.6   7.1  3 X Average Risk  23.4   11.0        Use the calculated Patient Ratio above and the CHD Risk Table to determine the patient's CHD Risk.        ATP III CLASSIFICATION (LDL):  <100     mg/dL   Optimal  284-132  mg/dL   Near or Above                    Optimal  130-159  mg/dL   Borderline  440-102  mg/dL   High  >725     mg/dL   Very High Performed at [facility], [address], [place], [place] [address]    SARS CORONAVIRUS 2 (TAT 6-24 HRS) Nasopharyngeal Nasopharyngeal Swab     Status: None    Collection Time: 01/17/21  8:25 AM    Specimen:  Nasopharyngeal Swab  Result Value Ref Range    SARS Coronavirus 2 NEGATIVE NEGATIVE      Comment: (NOTE) SARS-CoV-2 target nucleic acids are NOT DETECTED.   The SARS-CoV-2 RNA is generally detectable in upper and lower respiratory specimens during the acute phase of infection. Negative results do not preclude SARS-CoV-2 infection, do not rule out co-infections with other pathogens, and should not be used as the sole basis for treatment or other patient management decisions. Negative results must be combined with clinical observations, patient history, and epidemiological information. The expected result is Negative.   Fact Sheet for Patients: HairSlick.no   Fact Sheet for Healthcare Providers: quierodirigir.com   This test is not yet approved or cleared by the [place] FDA and has been authorized for detection and/or diagnosis of SARS-CoV-2 by FDA under an Emergency Use Authorization (EUA). This  EUA will remain in effect (meaning this test can be used) for the duration of the COVID-19 declaration under Se ction 564(b)(1) of the Act, 21 U.S.C. section 360bbb-3(b)(1), unless the authorization is terminated or revoked sooner.   Performed at [facility], [address] [place], [place] [address]         Imaging Results (Last 48 hours)  CT ANGIO HEAD W OR WO CONTRAST   Result Date: 01/17/2021 CLINICAL DATA:  Left upper and lower extremity weakness EXAM: CT ANGIOGRAPHY HEAD AND NECK TECHNIQUE: Multidetector CT imaging of the head and neck was performed using the standard protocol during bolus administration of intravenous contrast. Multiplanar CT image reconstructions and MIPs were obtained to evaluate the vascular anatomy. Carotid stenosis measurements (when applicable) are obtained utilizing NASCET criteria, using the distal internal carotid diameter as the denominator. CONTRAST:  64mL OMNIPAQUE IOHEXOL 350 MG/ML SOLN  COMPARISON:  None. FINDINGS: CTA NECK FINDINGS SKELETON: There is no bony spinal canal stenosis. No lytic or blastic lesion. OTHER NECK: Normal pharynx, larynx and major salivary glands. No cervical lymphadenopathy. Unremarkable thyroid gland. UPPER CHEST: No pneumothorax or pleural effusion. No nodules or masses. AORTIC ARCH: There is no calcific atherosclerosis of the aortic arch. There is no aneurysm, dissection or hemodynamically significant stenosis of the visualized portion of the aorta. Conventional 3 vessel aortic branching pattern. The visualized proximal subclavian arteries are widely patent. RIGHT CAROTID SYSTEM: No dissection, occlusion or aneurysm. There is mixed density atherosclerosis extending into the proximal ICA, resulting in less than 50% stenosis. LEFT CAROTID SYSTEM: No dissection, occlusion or aneurysm. There is mixed density atherosclerosis extending into the proximal ICA, resulting in less than 50% stenosis. VERTEBRAL ARTERIES: Left dominant configuration. Both origins are clearly patent. There is no dissection, occlusion or flow-limiting stenosis to the skull base (V1-V3 segments). CTA HEAD FINDINGS POSTERIOR CIRCULATION: --Vertebral arteries: Normal V4 segments. --Inferior cerebellar arteries: Normal. --Basilar artery: Normal. --Superior cerebellar arteries: Normal. --Posterior cerebral arteries (PCA): Normal. ANTERIOR CIRCULATION: --Intracranial internal carotid arteries: Normal. --Anterior cerebral arteries (ACA): Normal. Both A1 segments are present. Patent anterior communicating artery (a-comm). --Middle cerebral arteries (MCA): Normal. VENOUS SINUSES: As permitted by contrast timing, patent. ANATOMIC VARIANTS: None Review of the MIP images confirms the above findings. IMPRESSION: 1. No intracranial arterial occlusion or high-grade stenosis. 2. Bilateral carotid bifurcation atherosclerosis without hemodynamically significant stenosis by NASCET criteria. Electronically Signed   By:  Deatra Robinson M.D.   On: 01/17/2021 01:02   CT Head Wo Contrast   Result Date: 01/16/2021 CLINICAL DATA:  Neural deficit. EXAM: CT HEAD WITHOUT CONTRAST TECHNIQUE: Contiguous axial images were obtained from the base of the skull through the vertex without intravenous contrast. COMPARISON:  None. FINDINGS: Brain: No evidence of acute hemorrhage, hydrocephalus, extra-axial collection or mass lesion/mass effect. Advanced deep white matter microangiopathic changes. Moderate brain parenchymal volume loss. Possible age-indeterminate lacunar infarcts in the posterior aspect of the basal ganglia and bilateral thalami. Vascular: No hyperdense vessel or unexpected calcification. Skull: Normal. Negative for fracture or focal lesion. Sinuses/Orbits: No acute finding. Other: None. IMPRESSION: 1. Possible age-indeterminate lacunar infarcts in the posterior aspects of the basal ganglia and bilateral thalami. 2. Advanced deep white matter microangiopathic changes. 3. Moderate brain parenchymal volume loss. Electronically Signed   By: Ted Mcalpine M.D.   On: 01/16/2021 14:05   CT ANGIO NECK W OR WO CONTRAST   Result Date: 01/17/2021 CLINICAL DATA:  Left upper and lower extremity weakness EXAM: CT ANGIOGRAPHY HEAD AND NECK TECHNIQUE:  Multidetector CT imaging of the head and neck was performed using the standard protocol during bolus administration of intravenous contrast. Multiplanar CT image reconstructions and MIPs were obtained to evaluate the vascular anatomy. Carotid stenosis measurements (when applicable) are obtained utilizing NASCET criteria, using the distal internal carotid diameter as the denominator. CONTRAST:  46mL OMNIPAQUE IOHEXOL 350 MG/ML SOLN COMPARISON:  None. FINDINGS: CTA NECK FINDINGS SKELETON: There is no bony spinal canal stenosis. No lytic or blastic lesion. OTHER NECK: Normal pharynx, larynx and major salivary glands. No cervical lymphadenopathy. Unremarkable thyroid gland. UPPER CHEST: No  pneumothorax or pleural effusion. No nodules or masses. AORTIC ARCH: There is no calcific atherosclerosis of the aortic arch. There is no aneurysm, dissection or hemodynamically significant stenosis of the visualized portion of the aorta. Conventional 3 vessel aortic branching pattern. The visualized proximal subclavian arteries are widely patent. RIGHT CAROTID SYSTEM: No dissection, occlusion or aneurysm. There is mixed density atherosclerosis extending into the proximal ICA, resulting in less than 50% stenosis. LEFT CAROTID SYSTEM: No dissection, occlusion or aneurysm. There is mixed density atherosclerosis extending into the proximal ICA, resulting in less than 50% stenosis. VERTEBRAL ARTERIES: Left dominant configuration. Both origins are clearly patent. There is no dissection, occlusion or flow-limiting stenosis to the skull base (V1-V3 segments). CTA HEAD FINDINGS POSTERIOR CIRCULATION: --Vertebral arteries: Normal V4 segments. --Inferior cerebellar arteries: Normal. --Basilar artery: Normal. --Superior cerebellar arteries: Normal. --Posterior cerebral arteries (PCA): Normal. ANTERIOR CIRCULATION: --Intracranial internal carotid arteries: Normal. --Anterior cerebral arteries (ACA): Normal. Both A1 segments are present. Patent anterior communicating artery (a-comm). --Middle cerebral arteries (MCA): Normal. VENOUS SINUSES: As permitted by contrast timing, patent. ANATOMIC VARIANTS: None Review of the MIP images confirms the above findings. IMPRESSION: 1. No intracranial arterial occlusion or high-grade stenosis. 2. Bilateral carotid bifurcation atherosclerosis without hemodynamically significant stenosis by NASCET criteria. Electronically Signed   By: Deatra Robinson M.D.   On: 01/17/2021 01:02   MR BRAIN WO CONTRAST   Result Date: 01/16/2021 CLINICAL DATA:  Acute stroke presentation. Specific symptoms not described. EXAM: MRI HEAD WITHOUT CONTRAST TECHNIQUE: Multiplanar, multiecho pulse sequences of the  brain and surrounding structures were obtained without intravenous contrast. COMPARISON:  Head CT earlier same day. FINDINGS: Brain: Diffusion imaging shows a 1 cm acute infarction in the posterior basal ganglia/posterior limb internal capsule on the right. No other acute infarction. Chronic small-vessel ischemic changes affect pons. Few old small vessel cerebellar infarctions with hemosiderin deposition. Old small vessel infarctions of the thalami. Chronic small-vessel ischemic changes throughout the deep and subcortical white matter. No large vessel territory infarction. Few scattered foci hemosiderin deposition within the deep hemispheres as well. No mass, hydrocephalus or extra-axial collection. Vascular: Major vessels at the base of the brain show flow. Skull and upper cervical spine: Negative Sinuses/Orbits: Clear/normal Other: None IMPRESSION: 1 cm acute infarction at the posterior basal ganglia/posterior limb internal capsule on the right. Extensive chronic small-vessel ischemic changes elsewhere throughout the brain as described above, several showing hemosiderin deposition related to old microhemorrhage. Electronically Signed   By: Paulina Fusi M.D.   On: 01/16/2021 20:51             Medical Problem List and Plan: 1.  Decreased self-care and mobility secondary to right internal capsule infarct             -patient may  shower             -ELOS/Goals: 5 to 7 days 2.  Antithrombotics: -DVT/anticoagulation:  Pharmaceutical: Other (comment)--Eliquis             -  antiplatelet therapy: Low dose ASA added 06/16 3. Pain Management: N/A 4. Mood: LCSW to follow for evaluation and support.              -antipsychotic agents: N/A 5. Neuropsych: This patient is capable of making decisions on his own behalf. 6. Skin/Wound Care:  Routine pressure relief measures.  7. Fluids/Electrolytes/Nutrition: Monitor I/O. Check lytes in am. 8. H/o A fib/Aflutter: Monitor HR TID.  9. CAD s/p CABG 2003: Continue  Toprol XL, Crestor, Entesto, Imdur and ASA --Monitor for symptoms with increase in activity 10. CM /Chronic systolic CHF: EF 13-08%. Will monitor for signs of overload and check weights daily.             --heart healthy diet.             --continue Entresto, Imdur, Metoprolol and Crestor.  11. T2DM: Hgb A1C-6.7 - now diabetic-->Add CM restrictions. --Will monitor BS ac/hs and use SSI for noe.  12.OSA: Has refused CPAP   "I have personally performed a face to face diagnostic evaluation of this patient.  Additionally, I have reviewed and concur with the physician assistant's documentation above." Erick Colace M.D. [facility] Fellow Am Acad of Phys Med and Rehab Diplomate Am Board of Electrodiagnostic Med Fellow Am Board of Interventional Pain

## 2021-01-19 NOTE — Plan of Care (Signed)
Patient for discharge to CIR

## 2021-01-19 NOTE — Progress Notes (Signed)
Patient states he doesn't wear CPAP and will not need CPAP. RT will continue to monitor.

## 2021-01-20 LAB — CBC WITH DIFFERENTIAL/PLATELET
Abs Immature Granulocytes: 0.08 10*3/uL — ABNORMAL HIGH (ref 0.00–0.07)
Basophils Absolute: 0.1 10*3/uL (ref 0.0–0.1)
Basophils Relative: 1 %
Eosinophils Absolute: 0.3 10*3/uL (ref 0.0–0.5)
Eosinophils Relative: 3 %
HCT: 43 % (ref 39.0–52.0)
Hemoglobin: 14.4 g/dL (ref 13.0–17.0)
Immature Granulocytes: 1 %
Lymphocytes Relative: 26 %
Lymphs Abs: 2.5 10*3/uL (ref 0.7–4.0)
MCH: 30.8 pg (ref 26.0–34.0)
MCHC: 33.5 g/dL (ref 30.0–36.0)
MCV: 92.1 fL (ref 80.0–100.0)
Monocytes Absolute: 0.9 10*3/uL (ref 0.1–1.0)
Monocytes Relative: 9 %
Neutro Abs: 5.9 10*3/uL (ref 1.7–7.7)
Neutrophils Relative %: 60 %
Platelets: 197 10*3/uL (ref 150–400)
RBC: 4.67 MIL/uL (ref 4.22–5.81)
RDW: 14.2 % (ref 11.5–15.5)
WBC: 9.7 10*3/uL (ref 4.0–10.5)
nRBC: 0 % (ref 0.0–0.2)

## 2021-01-20 LAB — COMPREHENSIVE METABOLIC PANEL
ALT: 30 U/L (ref 0–44)
AST: 26 U/L (ref 15–41)
Albumin: 3.5 g/dL (ref 3.5–5.0)
Alkaline Phosphatase: 71 U/L (ref 38–126)
Anion gap: 9 (ref 5–15)
BUN: 20 mg/dL (ref 8–23)
CO2: 29 mmol/L (ref 22–32)
Calcium: 9.4 mg/dL (ref 8.9–10.3)
Chloride: 100 mmol/L (ref 98–111)
Creatinine, Ser: 1.31 mg/dL — ABNORMAL HIGH (ref 0.61–1.24)
GFR, Estimated: 57 mL/min — ABNORMAL LOW (ref >60)
Glucose, Bld: 120 mg/dL — ABNORMAL HIGH (ref 70–99)
Potassium: 3.7 mmol/L (ref 3.5–5.1)
Sodium: 138 mmol/L (ref 135–145)
Total Bilirubin: 0.8 mg/dL (ref 0.3–1.2)
Total Protein: 6.1 g/dL — ABNORMAL LOW (ref 6.5–8.1)

## 2021-01-20 LAB — GLUCOSE, CAPILLARY
Glucose-Capillary: 113 mg/dL — ABNORMAL HIGH (ref 70–99)
Glucose-Capillary: 108 mg/dL — ABNORMAL HIGH (ref 70–99)
Glucose-Capillary: 113 mg/dL — ABNORMAL HIGH (ref 70–99)
Glucose-Capillary: 104 mg/dL — ABNORMAL HIGH (ref 70–99)

## 2021-01-20 NOTE — Evaluation (Signed)
Occupational Therapy Assessment and Plan  Patient Details  Name: Stanley [name]: [id number] Date of Birth: [birth year]  OT Diagnosis: altered mental status, cognitive deficits, hemiplegia affecting non-dominant side, and muscle weakness (generalized) Rehab Potential: Rehab Potential (ACUTE ONLY): Excellent ELOS: 7-9 days   Today's Date: 01/20/2021 OT Individual Time: 9030-0923 OT Individual Time Calculation (min): 62 min     Hospital Problem: Principal Problem:   Subcortical infarction [facility]) Active Problems:   Embolic stroke of right basal ganglia ([place])   Past Medical History:  Past Medical History:  Diagnosis Date   Atrial flutter with rapid ventricular response ([place]) 07/23/2018   CAD (coronary artery disease)    a. s/p CABG 2003  b. s/p acute MI prior to CABG w/ EF <30%   Cardiomyopathy, ischemic: EF 20-25%    Cholecystitis    Chronic combined systolic and diastolic CHF (congestive heart failure) ([place])    Chronic systolic CHF (congestive heart failure) ([place])    a. 2D ECHO: 05/17/2014: EF 30-35%;  b. 07/2015 TEE: EF 20-25%, diff HK, sev MR, sev dil LA w/o thrombus, mod reduced RV fxn, mod dil RA, mod TR.   Hyperlipidemia    Hypertensive heart disease    Kidney stones X 1   "passed it" (08/02/2015)   OSA (obstructive sleep apnea) 10/11/2014   refuses to use CPAP   Persistent atrial fibrillation ([place])    a. s/p succesful TEE/DCCV on 06/01/14.  b. on Eliquis;  c. 07/2015 recurrent AF-->Failed DCCV.   S/P CABG x 5 02/16/2002   LIMA to LAD, SVG to D1, SVG to OM2, Sequential SVG to PDA-RPL, saphenous vein harvest from right thigh and lower leg   Severe mitral regurgitation    Functional, improved with improvement in his EF.   Tricuspid regurgitation    Past Surgical History:  Past Surgical History:  Procedure Laterality Date   ATRIAL FIBRILLATION ABLATION N/A 04/11/2020   Procedure: ATRIAL FIBRILLATION ABLATION;  Surgeon: Vickie Epley, MD;  Location: [place] CV  LAB;  Service: Cardiovascular;  Laterality: N/A;   CARDIAC CATHETERIZATION N/A 06/19/2014   Procedure: RIGHT/LEFT HEART CATH AND CORONARY/GRAFT ANGIOGRAPHY;  Surgeon: Troy Sine, MD;  Location: [facility] CATH LAB;  Service: Cardiovascular;  Laterality: N/A;   CARDIAC CATHETERIZATION  2003   CARDIAC CATHETERIZATION N/A 08/28/2015   Procedure: Right Heart Cath and Coronary/Graft Angiography;  Surgeon: Larey Dresser, MD;  Location: [place] CV LAB;  Service: Cardiovascular;  Laterality: N/A;   CARDIOVERSION N/A 06/01/2014   Procedure: CARDIOVERSION;  Surgeon: Dorothy Spark, MD;  Location: [facility] ENDOSCOPY;  Service: Cardiovascular;  Laterality: N/A;   CARDIOVERSION  06/11/2015; 08/02/2015   CARDIOVERSION N/A 08/02/2015   Procedure: CARDIOVERSION;  Surgeon: Josue Hector, MD;  Location: [facility] ENDOSCOPY;  Service: Cardiovascular;  Laterality: N/A;   CARDIOVERSION N/A 10/22/2015   Procedure: CARDIOVERSION;  Surgeon: Larey Dresser, MD;  Location: [place];  Service: Cardiovascular;  Laterality: N/A;   CARDIOVERSION N/A 03/15/2020   Procedure: CARDIOVERSION;  Surgeon: Larey Dresser, MD;  Location: [facility] ENDOSCOPY;  Service: Cardiovascular;  Laterality: N/A;   CARDIOVERSION N/A 05/14/2020   Procedure: CARDIOVERSION;  Surgeon: Pixie Casino, MD;  Location: [place];  Service: Cardiovascular;  Laterality: N/A;   CORONARY ARTERY BYPASS GRAFT  02/16/2002   CABG X5   LAPAROSCOPIC APPENDECTOMY N/A 07/20/2016   Procedure: APPENDECTOMY LAPAROSCOPIC;  Surgeon: Donnie Mesa, MD;  Location: [place];  Service: General;  Laterality: N/A;   TEE WITHOUT CARDIOVERSION N/A 06/01/2014  Procedure: TRANSESOPHAGEAL ECHOCARDIOGRAM (TEE);  Surgeon: Dorothy Spark, MD;  Location: [place];  Service: Cardiovascular;  Laterality: N/A;   TEE WITHOUT CARDIOVERSION N/A 08/03/2015   Procedure: TRANSESOPHAGEAL ECHOCARDIOGRAM (TEE);  Surgeon: Dorothy Spark, MD;  Location: [facility] ENDOSCOPY;  Service: Cardiovascular;   Laterality: N/A;    Assessment & Plan Clinical Impression: Patient is a 75 y.o. year old male with recent admission to the hospital on 01/16/21 with left-sided weakness with sensory deficits that started while on a camping trip on 01/11/2021.  He had difficulty walking as well as tendency dropping things from his left hand he was seen in the ED 06/13 but left without evaluation.  He returned to the ED for work up on 01/16/21 .  UDS negative.  CT head done showing possible age indeterminate lacunar infarcts as well as advanced deep white matter microangiopathic changes and moderate parenchymal brain volume loss.  MRI brain done showing 1 cm acute infarct in posterior basal ganglia/posterior limb internal capsule on right as well as evidence of old microhemorrhages with hemosiderin deposits.  Patient transferred to CIR on 01/19/2021 .    Patient currently requires min with basic self-care skills secondary to muscle weakness and muscle paralysis, impaired timing and sequencing, unbalanced muscle activation, and decreased coordination, decreased attention, decreased safety awareness, and decreased memory, and decreased standing balance, decreased postural control, hemiplegia, and decreased balance strategies.  Prior to hospitalization, patient could complete ADLs with independent .  Patient will benefit from skilled intervention to decrease level of assist with basic self-care skills, increase independence with basic self-care skills, and increase level of independence with iADL prior to discharge home with care partner.  Anticipate patient will require intermittent supervision and follow up outpatient.  OT - End of Session Activity Tolerance: Improving Endurance Deficit: Yes Endurance Deficit Description: frequent rest breaks during functional activity OT Assessment Rehab Potential (ACUTE ONLY): Excellent OT Patient demonstrates impairments in the following area(s): Balance;Cognition;Motor;Safety OT  Basic ADL's Functional Problem(s): Grooming;Bathing;Dressing;Toileting OT Advanced ADL's Functional Problem(s): Simple Meal Preparation;Light Housekeeping OT Transfers Functional Problem(s): Toilet;Tub/Shower OT Additional Impairment(s): Fuctional Use of Upper Extremity OT Plan OT Intensity: Minimum of 1-2 x/day, 45 to 90 minutes OT Frequency: 5 out of 7 days OT Duration/Estimated Length of Stay: 7-9 days OT Treatment/Interventions: Balance/vestibular training;Discharge planning;Self Care/advanced ADL retraining;Pain management;Therapeutic Activities;UE/LE Coordination activities;Patient/family education;Functional mobility training;Cognitive remediation/compensation;Community reintegration;DME/adaptive equipment instruction;Neuromuscular re-education;UE/LE Strength taining/ROM OT Self Feeding Anticipated Outcome(s): Independent OT Basic Self-Care Anticipated Outcome(s): Modified independent OT Toileting Anticipated Outcome(s): modified independent OT Bathroom Transfers Anticipated Outcome(s): modified independent OT Recommendation Patient destination: Home Follow Up Recommendations: Outpatient OT Equipment Recommended: To be determined   OT Evaluation Precautions/Restrictions  Precautions Precautions: Fall Precaution Comments: Had fall on saturday when he first had symptoms Restrictions Weight Bearing Restrictions: No  Pain  No report of pain  Home Living/Prior Functioning Home Living Available Help at Discharge: Family Type of Home: House Home Access: Stairs to enter Technical brewer of Steps: 4 Entrance Stairs-Rails: Right Home Layout: One level Bathroom Shower/Tub: Multimedia programmer: Associate Professor Accessibility: Yes  Lives With: Spouse IADL History Homemaking Responsibilities: Yes Meal Prep Responsibility: Secondary Laundry Responsibility: Secondary Cleaning Responsibility: Secondary Occupation: Retired Prior Function Level of Independence:  Independent with gait, Independent with transfers  Able to Take Stairs?: Yes Driving: Yes Vocation: Retired Leisure: Hobbies-yes (Comment) Comments: Still driving.  Has been moving minimally since onset of symptoms Vision Baseline Vision/History: No visual deficits Patient Visual Report: No change from baseline Vision Assessment?: Yes  Eye Alignment: Within Functional Limits Ocular Range of Motion: Within Functional Limits Alignment/Gaze Preference: Within Defined Limits Tracking/Visual Pursuits: Able to track stimulus in all quads without difficulty Convergence: Within functional limits Perception  Perception: Within Functional Limits Praxis Praxis: Intact Cognition Overall Cognitive Status: Impaired/Different from baseline Arousal/Alertness: Awake/alert Orientation Level: Person;Place;Situation Person: Oriented Place: Oriented Situation: Oriented Year: 2022 Month: June Day of Week: Correct Memory: Impaired Memory Impairment: Retrieval deficit;Decreased recall of new information Immediate Memory Recall: Sock;Blue;Bed Memory Recall Sock: Without Cue Memory Recall Blue: Without Cue Memory Recall Bed: With Cue Attention: Sustained;Focused;Selective Focused Attention: Appears intact Sustained Attention: Appears intact Selective Attention: Impaired Awareness: Appears intact Awareness Impairment: Emergent impairment Problem Solving: Impaired Sequencing: Appears intact Behaviors: Impulsive Safety/Judgment: Impaired Sensation Sensation Light Touch: Appears Intact Hot/Cold: Appears Intact Proprioception: Appears Intact Stereognosis: Appears Intact Coordination Gross Motor Movements are Fluid and Coordinated: No Fine Motor Movements are Fluid and Coordinated: Yes Coordination and Movement Description: Slight decreased coordination noted with finger to nose testing in the LUE compared to the right, but uses it functionally at a non-dominant level. Heel Shin Test: decreased  smoothness and coordination LLE Motor  Motor Motor: Hemiplegia Motor - Skilled Clinical Observations: Mild left hemiparesis  Trunk/Postural Assessment  Cervical Assessment Cervical Assessment: Exceptions to [facility] (head forward) Thoracic Assessment Thoracic Assessment: Exceptions to [facility] (slight thoracic rounding) Lumbar Assessment Lumbar Assessment: Exceptions to [facility] (posterior pelvic tilt) Postural Control Postural Control: Deficits on evaluation Righting Reactions: delayed/insufficient  Balance Balance Balance Assessed: Yes Standardized Balance Assessment Standardized Balance Assessment: Berg Balance Test Berg Balance Test Sit to Stand: Needs minimal aid to stand or to stabilize Standing Unsupported: Able to stand 2 minutes with supervision Sitting with Back Unsupported but Feet Supported on Floor or Stool: Able to sit safely and securely 2 minutes Stand to Sit: Uses backs of legs against chair to control descent Transfers: Needs one person to assist Standing Unsupported with Eyes Closed: Able to stand 10 seconds with supervision Standing Ubsupported with Feet Together: Able to place feet together independently and stand for 1 minute with supervision From Standing, Reach Forward with Outstretched Arm: Loses balance while trying/requires external support From Standing Position, Pick up Object from Floor: Unable to try/needs assist to keep balance From Standing Position, Turn to Look Behind Over each Shoulder: Needs supervision when turning Turn 360 Degrees: Needs assistance while turning Standing Unsupported, Alternately Place Feet on Step/Stool: Able to complete >2 steps/needs minimal assist Standing Unsupported, One Foot in Front: Able to take small step independently and hold 30 seconds Standing on One Leg: Tries to lift leg/unable to hold 3 seconds but remains standing independently Total Score: 22 Static Sitting Balance Static Sitting - Balance Support: No upper extremity  supported;Feet supported Static Sitting - Level of Assistance: 7: Independent Dynamic Sitting Balance Dynamic Sitting - Balance Support: During functional activity Dynamic Sitting - Level of Assistance: 5: Stand by assistance Static Standing Balance Static Standing - Balance Support: No upper extremity supported Static Standing - Level of Assistance: 4: Min assist (contact guard) Dynamic Standing Balance Dynamic Standing - Balance Support: No upper extremity supported;During functional activity Dynamic Standing - Level of Assistance: 4: Min assist Extremity/Trunk Assessment RUE Assessment RUE Assessment: Within Functional Limits LUE Assessment LUE Assessment: Exceptions to [facility] Passive Range of Motion (PROM) Comments: WFLs Active Range of Motion (AROM) Comments: WFLs General Strength Comments: 4/5 throughout with increased ataxia noted with finger to nose testing.  He was able to use the LUE at a non-dominant level with  supervision  Care Tool Care Tool Self Care Eating   Eating Assist Level: Independent    Oral Care    Oral Care Assist Level: Contact Guard/Toucning assist    Bathing   Body parts bathed by patient: Right arm;Left arm;Chest;Abdomen;Front perineal area;Buttocks;Right upper leg;Left upper leg;Right lower leg;Left lower leg;Face     Assist Level: Minimal Assistance - Patient > 75%    Upper Body Dressing(including orthotics)   What is the patient wearing?: Pull over shirt   Assist Level: Set up assist    Lower Body Dressing (excluding footwear)   What is the patient wearing?: Underwear/pull up;Pants      Putting on/Taking off footwear   What is the patient wearing?: Non-skid slipper socks         Care Tool Toileting Toileting activity         Care Tool Bed Mobility Roll left and right activity   Roll left and right assist level: Supervision/Verbal cueing    Sit to lying activity   Sit to lying assist level: Supervision/Verbal cueing    Lying to  sitting edge of bed activity   Lying to sitting edge of bed assist level: Supervision/Verbal cueing     Care Tool Transfers Sit to stand transfer   Sit to stand assist level: Contact Guard/Touching assist    Chair/bed transfer   Chair/bed transfer assist level: Contact Guard/Touching assist     Toilet transfer   Assist Level: Contact Guard/Touching assist     Care Tool Cognition Expression of Ideas and Wants Expression of Ideas and Wants: Without difficulty (complex and basic) - expresses complex messages without difficulty and with speech that is clear and easy to understand   Understanding Verbal and Non-Verbal Content Understanding Verbal and Non-Verbal Content: Usually understands - understands most conversations, but misses some part/intent of message. Requires cues at times to understand   Memory/Recall Ability *first 3 days only Memory/Recall Ability *first 3 days only: Current season;That he or she is in a hospital/hospital unit    Refer to Care Plan for Long Term Goals  SHORT TERM GOAL WEEK 1 OT Short Term Goal 1 (Week 1): STGs equal to LTGs set at modified independent overall.  Recommendations for other services: None    Skilled Therapeutic Intervention ADL ADL Eating: Independent Where Assessed-Eating: Edge of bed Grooming: Contact guard Where Assessed-Grooming: Standing at sink Upper Body Bathing: Supervision/safety Where Assessed-Upper Body Bathing: Chair;Shower Lower Body Bathing: Minimal assistance Where Assessed-Lower Body Bathing: Chair;Shower Upper Body Dressing: Setup Where Assessed-Upper Body Dressing: Edge of bed Lower Body Dressing: Minimal assistance Where Assessed-Lower Body Dressing: Edge of bed Toileting: Minimal assistance Where Assessed-Toileting: Glass blower/designer: Psychiatric nurse Method: Counselling psychologist: Raised toilet seat;Grab bars Tub/Shower Transfer: Metallurgist  Method: Magazine features editor: Environmental education officer Method: Heritage manager: Manufacturing systems engineer  Bed Mobility Bed Mobility: Rolling Right;Rolling Left;Supine to Sit;Sit to Supine Rolling Right: Supervision/verbal cueing Rolling Left: Supervision/Verbal cueing Supine to Sit: Supervision/Verbal cueing Sit to Supine: Supervision/Verbal cueing Transfers Sit to Stand: Contact Guard/Touching assist Stand to Sit: Contact Guard/Touching assist  Session Note:  Pt agreeable and pleasant to participate in shower and dressing.  He was able to ambulate in the room with initial min assist for the first few steps secondary to LLE weakness with progression to more min guard to the shower bench.  He was able to remove clothing sit to stand at min assist and then  complete shower at the same level with use of the grab bar when standing for support.  He was able to complete dressing sit to stand at the EOB with min assist as  well as oral hygiene in standing at the sink.  Pt voices awareness of balance and coordination issues on the left side with functional mobility down and back to the gym at min assist without an assistive device.  Finished session with return to the bed and with the call button and phone in reach.  Pt aware of need to call for assistance before trying to get OOB.    Discharge Criteria: Patient will be discharged from OT if patient refuses treatment 3 consecutive times without medical reason, if treatment goals not met, if there is a change in medical status, if patient makes no progress towards goals or if patient is discharged from hospital.  The above assessment, treatment plan, treatment alternatives and goals were discussed and mutually agreed upon: by patient  Lahna Nath,Nehal OTR/L 01/20/2021, 1:00 PM

## 2021-01-20 NOTE — Progress Notes (Signed)
Pt refused cpap for tonight 

## 2021-01-20 NOTE — Evaluation (Signed)
Physical Therapy Assessment and Plan  Patient Details  Name: Stanley [name]: [id number] Date of Birth: 04-12-1946  PT Diagnosis: Abnormality of gait, Difficulty walking, Hemiplegia non-dominant, and Impaired cognition Rehab Potential: Good ELOS: 5-7 days   Today's Date: 01/20/2021 PT Individual Time: 1100-1200 PT Individual Time Calculation (min): 60 min    Hospital Problem: Principal Problem:   Subcortical infarction [facility]) Active Problems:   Embolic stroke of right basal ganglia ([place])   Past Medical History:  Past Medical History:  Diagnosis Date   Atrial flutter with rapid ventricular response ([place]) 07/23/2018   CAD (coronary artery disease)    a. s/p CABG 2003  b. s/p acute MI prior to CABG w/ EF <30%   Cardiomyopathy, ischemic: EF 20-25%    Cholecystitis    Chronic combined systolic and diastolic CHF (congestive heart failure) ([place])    Chronic systolic CHF (congestive heart failure) ([place])    a. 2D ECHO: 05/17/2014: EF 30-35%;  b. 07/2015 TEE: EF 20-25%, diff HK, sev MR, sev dil LA w/o thrombus, mod reduced RV fxn, mod dil RA, mod TR.   Hyperlipidemia    Hypertensive heart disease    Kidney stones X 1   "passed it" (08/02/2015)   OSA (obstructive sleep apnea) 10/11/2014   refuses to use CPAP   Persistent atrial fibrillation ([place])    a. s/p succesful TEE/DCCV on 06/01/14.  b. on Eliquis;  c. 07/2015 recurrent AF-->Failed DCCV.   S/P CABG x 5 02/16/2002   LIMA to LAD, SVG to D1, SVG to OM2, Sequential SVG to PDA-RPL, saphenous vein harvest from right thigh and lower leg   Severe mitral regurgitation    Functional, improved with improvement in his EF.   Tricuspid regurgitation    Past Surgical History:  Past Surgical History:  Procedure Laterality Date   ATRIAL FIBRILLATION ABLATION N/A 04/11/2020   Procedure: ATRIAL FIBRILLATION ABLATION;  Surgeon: Vickie Epley, MD;  Location: [place] CV LAB;  Service: Cardiovascular;  Laterality: N/A;   CARDIAC  CATHETERIZATION N/A 06/19/2014   Procedure: RIGHT/LEFT HEART CATH AND CORONARY/GRAFT ANGIOGRAPHY;  Surgeon: Troy Sine, MD;  Location: [facility] C[facility] LAB;  Service: Cardiovascular;  Laterality: N/A;   CARDIAC CATHETERIZATION  2003   CARDIAC CATHETERIZATION N/A 08/28/2015   Procedure: Right Heart Cath and Coronary/Graft Angiography;  Surgeon: Larey Dresser, MD;  Location: [place] CV LAB;  Service: Cardiovascular;  Laterality: N/A;   CARDIOVERSION N/A 06/01/2014   Procedure: CARDIOVERSION;  Surgeon: Dorothy Spark, MD;  Location: [facility] ENDOSCOPY;  Service: Cardiovascular;  Laterality: N/A;   CARDIOVERSION  06/11/2015; 08/02/2015   CARDIOVERSION N/A 08/02/2015   Procedure: CARDIOVERSION;  Surgeon: Josue Hector, MD;  Location: [place];  Service: Cardiovascular;  Laterality: N/A;   CARDIOVERSION N/A 10/22/2015   Procedure: CARDIOVERSION;  Surgeon: Larey Dresser, MD;  Location: [place];  Service: Cardiovascular;  Laterality: N/A;   CARDIOVERSION N/A 03/15/2020   Procedure: CARDIOVERSION;  Surgeon: Larey Dresser, MD;  Location[facility] END[facility]COPY;  Service: Cardiovascular;  Laterality: N/A;   CARDIOVERSION N/A 05/14/2020   Procedure: CARDIOVERSION;  Surgeon: Pixie Casino, MD;  Location: [place];  Service: Cardiovascular;  Laterality: N/A;   CORONARY ARTERY BYPASS GRAFT  02/16/2002   CABG X5   LAPAROSCOPIC APPENDECTOMY N/A 07/20/2016   Procedure: APPENDECTOMY LAPAROSCOPIC;  Surgeon: Donnie Mesa, MD;  Location: [place];  Service: General;  Laterality: N/A;   TEE WITHOUT CARDIOVERSION N/A 06/01/2014   Procedure: TRANSESOPHAGEAL ECHOCARDIOGRAM (TEE);  Surgeon: Houston Siren  Hazel Sams, MD;  Location: [facility] ENDOSCOPY;  Service: Cardiovascular;  Laterality: N/A;   TEE WITHOUT CARDIOVERSION N/A 08/03/2015   Procedure: TRANSESOPHAGEAL ECHOCARDIOGRAM (TEE);  Surgeon: Dorothy Spark, MD;  Location: [place];  Service: Cardiovascular;  Laterality: N/A;    Assessment & Plan Clinical  Impression:  Stanley Wells is a 75 year old male with history of CAD, HTN, Chronic combined CHF, Afib/Aflutter who was admitted on 01/16/21 with left-sided weakness with sensory deficits that started while on a camping trip on 01/11/2021.  He had difficulty walking as well as tendency dropping things from his left hand he was seen in the ED 06/13 but left without evaluation.  He returned to the ED for work up on 01/16/21 .  UDS negative.  CT head done showing possible age indeterminate lacunar infarcts as well as advanced deep white matter microangiopathic changes and moderate parenchymal brain volume loss.  MRI brain done showing 1 cm acute infarct in posterior basal ganglia/posterior limb internal capsule on right as well as evidence of old microhemorrhages with hemosiderin deposits. CTA head/neck was negative for occlusion or high-grade stenosis.     No tPA given as out of window for treatment and Dr. Erlinda Hong recommended addition of low-dose ASA for stroke prevention.  Symptoms improving and risk factor management recommended by neurology. 2D echo done today and pending.  Therapy evaluations completed on 06/16 revealing unsteady gait with balance deficits, tendency for LLE to buckle, kyphotic posture, poor awareness of deficits as well as reports of impaired problem-solving.  CIR recommended due to functional decline. Patient transferred to CIR on 01/19/2021 .   Patient currently requires min with mobility secondary to decreased cardiorespiratoy endurance, decreased coordination, decreased awareness, decreased problem solving, decreased safety awareness, and decreased memory, and decreased standing balance, decreased postural control, and decreased balance strategies.  Prior to hospitalization, patient was independent  with mobility and lived with Spouse in a House home.  Home access is 4Stairs to enter.  Patient will benefit from skilled PT intervention to maximize safe functional mobility, minimize fall  risk, and decrease caregiver burden for planned discharge home with 24 hour supervision.  Anticipate patient will benefit from follow up OP at discharge.  PT - End of Session Activity Tolerance: Tolerates 30+ min activity with multiple rests Endurance Deficit: Yes Endurance Deficit Description: frequent rest breaks during functional activity PT Assessment Rehab Potential (ACUTE/IP ONLY): Good PT Patient demonstrates impairments in the following area(s): Balance;Endurance;Motor;Safety PT Transfers Functional Problem(s): Bed Mobility;Bed to Chair;Car;Furniture;Floor PT Locomotion Functional Problem(s): Ambulation;Wheelchair Mobility;Stairs PT Plan PT Intensity: Minimum of 1-2 x/day ,45 to 90 minutes PT Frequency: 5 out of 7 days PT Duration Estimated Length of Stay: 5-7 days PT Treatment/Interventions: Ambulation/gait training;Balance/vestibular training;Cognitive remediation/compensation;Community reintegration;Discharge planning;Disease management/prevention;DME/adaptive equipment instruction;Functional mobility training;Neuromuscular re-education;Pain management;Patient/family education;Stair training;Therapeutic Activities;Therapeutic Exercise;UE/LE Strength taining/ROM;UE/LE Coordination activities PT Transfers Anticipated Outcome(s): Supervision PT Locomotion Anticipated Outcome(s): Supervision with LRAD PT Recommendation Follow Up Recommendations: Outpatient PT Patient destination: Home Equipment Recommended: None recommended by PT   PT Evaluation Precautions/Restrictions Precautions Precautions: Fall Precaution Comments: Had fall on saturday when he first had symptoms Restrictions Weight Bearing Restrictions: No Home Living/Prior Functioning Home Living Available Help at Discharge: Family Type of Home: House Home Access: Stairs to enter Technical brewer of Steps: 4 Entrance Stairs-Rails: Right Home Layout: One level  Lives With: Spouse Prior Function Level of  Independence: Independent with gait;Independent with transfers  Able to Take Stairs?: Yes Driving: Yes Vocation: Retired Radiographer, therapeutic - History Patient  Visual Report: No change from baseline Perception Perception: Within Functional Limits Praxis Praxis: Intact  Cognition Overall Cognitive Status: Within Functional Limits for tasks assessed Arousal/Alertness: Awake/alert Orientation Level: Oriented X4 Attention: Focused;Sustained Focused Attention: Appears intact Sustained Attention: Impaired Memory: Impaired Memory Impairment: Retrieval deficit;Decreased recall of new information Awareness: Impaired Awareness Impairment: Emergent impairment Problem Solving: Impaired Behaviors: Impulsive Safety/Judgment: Impaired Sensation Sensation Light Touch: Appears Intact Proprioception: Appears Intact Coordination Gross Motor Movements are Fluid and Coordinated: No Fine Motor Movements are Fluid and Coordinated: Yes Coordination and Movement Description: impaired 2/2 mild L hemi Heel Shin Test: decreased smoothness and coordination LLE Motor  Motor Motor: Hemiplegia;Abnormal postural alignment and control Motor - Skilled Clinical Observations: mild L hemi  Trunk/Postural Assessment  Cervical Assessment Cervical Assessment: Exceptions to [facility] (mild head forward) Thoracic Assessment Thoracic Assessment: Exceptions to [facility] (kyphotic) Lumbar Assessment Lumbar Assessment: Exceptions to [facility] (posterior pelvic tilt) Postural Control Postural Control: Deficits on evaluation Righting Reactions: delayed/insufficient  Balance Balance Balance Assessed: Yes Standardized Balance Assessment Standardized Balance Assessment: Berg Balance Test Berg Balance Test Sit to Stand: Needs minimal aid to stand or to stabilize Standing Unsupported: Able to stand 2 minutes with supervision Sitting with Back Unsupported but Feet Supported on Floor or Stool: Able to sit safely and securely 2  minutes Stand to Sit: Uses backs of legs against chair to control descent Transfers: Needs one person to assist Standing Unsupported with Eyes Closed: Able to stand 10 seconds with supervision Standing Ubsupported with Feet Together: Able to place feet together independently and stand for 1 minute with supervision From Standing, Reach Forward with Outstretched Arm: Loses balance while trying/requires external support From Standing Position, Pick up Object from Floor: Unable to try/needs assist to keep balance From Standing Position, Turn to Look Behind Over each Shoulder: Needs supervision when turning Turn 360 Degrees: Needs assistance while turning Standing Unsupported, Alternately Place Feet on Step/Stool: Able to complete >2 steps/needs minimal assist Standing Unsupported, One Foot in Front: Able to take small step independently and hold 30 seconds Standing on One Leg: Tries to lift leg/unable to hold 3 seconds but remains standing independently Total Score: 22 Static Sitting Balance Static Sitting - Balance Support: No upper extremity supported;Feet supported Static Sitting - Level of Assistance: 6: Modified independent (Device/Increase time) Dynamic Sitting Balance Dynamic Sitting - Balance Support: No upper extremity supported;Feet supported;During functional activity Dynamic Sitting - Level of Assistance: 5: Stand by assistance Static Standing Balance Static Standing - Balance Support: No upper extremity supported Static Standing - Level of Assistance: 4: Min assist Dynamic Standing Balance Dynamic Standing - Balance Support: No upper extremity supported;During functional activity Dynamic Standing - Level of Assistance: 4: Min assist Extremity Assessment   RLE Assessment RLE Assessment: Within Functional Limits General Strength Comments: 5/5 grossly LLE Assessment LLE Assessment: Within Functional Limits General Strength Comments: 4+/5 hip flexion, 5/5 knee and ankle  Care  Tool Care Tool Bed Mobility Roll left and right activity   Roll left and right assist level: Supervision/Verbal cueing    Sit to lying activity   Sit to lying assist level: Supervision/Verbal cueing    Lying to sitting edge of bed activity   Lying to sitting edge of bed assist level: Supervision/Verbal cueing     Care Tool Transfers Sit to stand transfer   Sit to stand assist level: Contact Guard/Touching assist    Chair/bed transfer   Chair/bed transfer assist level: Contact Guard/Touching assist     Toilet transfer   Assist  Level: Contact Guard/Touching Gaffer transfer assist level: Minimal Assistance - Patient > 75%      Care Tool Locomotion Ambulation   Assist level: Contact Guard/Touching assist Assistive device: No Device Max distance: 150'  Walk 10 feet activity   Assist level: Contact Guard/Touching assist Assistive device: No Device   Walk 50 feet with 2 turns activity   Assist level: Contact Guard/Touching assist Assistive device: No Device  Walk 150 feet activity   Assist level: Contact Guard/Touching assist Assistive device: No Device  Walk 10 feet on uneven surfaces activity   Assist level: Contact Guard/Touching assist Assistive device: Other (comment) (handrail)  Stairs   Assist level: Contact Guard/Touching assist Stairs assistive device: 2 hand rails Max number of stairs: 12  Walk up/down 1 step activity   Walk up/down 1 step (curb) assist level: Contact Guard/Touching assist Walk up/down 1 step or curb assistive device: 2 hand rails    Walk up/down 4 steps activity Walk up/down 4 steps assist level: Contact Guard/Touching assist Walk up/down 4 steps assistive device: 2 hand rails  Walk up/down 12 steps activity   Walk up/down 12 steps assist level: Contact Guard/Touching assist Walk up/down 12 steps assistive device: 2 hand rails  Pick up small objects from floor   Pick up small object from the floor assist level:  Minimal Assistance - Patient > 75%    Wheelchair Will patient use wheelchair at discharge?: No          Wheel 50 feet with 2 turns activity      Wheel 150 feet activity        Refer to Care Plan for Long Term Goals  SHORT TERM GOAL WEEK 1 PT Short Term Goal 1 (Week 1): =LTG due to ELOS  Recommendations for other services: None   Skilled Therapeutic Intervention Evaluation completed (see details above and below) with education on PT POC and goals and individual treatment initiated with focus on functional transfer, gait, and balance assessment. Pt received seated in bed, agreeable to PT session. Pt is Supervision for bed mobility from flat bed with no bedrails, requires increased time to complete bed mobility. Sit to stand with CGA and no AD throughout session. Ambulation up to 150 ft with no AD and CGA, wide BOS and decreased LLE clearance with onset of fatigue. Pt exhibits decreased safety and some impulsivity when performing turns. Car transfer with min A, cues for safe transfer technique needed. Ambulation up/down ramp and across uneven surface with CGA with use of handrail for steadying assist. Ascend/descend 12 x 6" stairs with 2 handrails and CGA for balance, step-to gait pattern. Patient demonstrates increased fall risk as noted by score of 22/56 on Berg Balance Scale.  (<36= high risk for falls, close to 100%; 37-45 significant >80%; 46-51 moderate >50%; 52-55 lower >25%). Reviewed score and functional implications for high fall risk. TUG: 13.9 sec. Reviewed score and functional implications. Ambulation through obstacle course weaving through cones, stepping up/down 4" step, and stepping over objects with min A needed for balance and cues for safety. Standing alt L/R cone taps with no AD and min A for balance, decreased control of LLE noted. Toilet transfer with CGA, independent for pericare and clothing management. Pt left seated EOB in room with needs in reach, bed alarm in place, NT  aware of pt's position.  Mobility Bed Mobility Bed Mobility: Rolling Right;Rolling Left;Supine to Sit;Sit to Supine Rolling Right: Supervision/verbal cueing Rolling Left:  Supervision/Verbal cueing Supine to Sit: Supervision/Verbal cueing Sit to Supine: Supervision/Verbal cueing Transfers Transfers: Sit to Stand;Stand Pivot Transfers Sit to Stand: Contact Guard/Touching assist Stand Pivot Transfers: Contact Guard/Touching assist Transfer (Assistive device): None Locomotion  Gait Gait Distance (Feet): 150 Feet Assistive device: None Gait Gait Pattern: Impaired (wide BOS, decreased LLE clearance) Gait velocity: decreased Stairs / Additional Locomotion Stairs: Yes Stairs Assistance: Contact Guard/Touching assist Stair Management Technique: Two rails;Step to pattern Number of Stairs: 12 Height of Stairs: 6 Ramp: Contact Guard/touching assist Wheelchair Mobility Wheelchair Mobility: No   Discharge Criteria: Patient will be discharged from PT if patient refuses treatment 3 consecutive times without medical reason, if treatment goals not met, if there is a change in medical status, if patient makes no progress towards goals or if patient is discharged from hospital.  The above assessment, treatment plan, treatment alternatives and goals were discussed and mutually agreed upon: by patient  Excell Seltzer, PT, DPT, CSRS 01/20/2021, 12:26 PM

## 2021-01-20 NOTE — Progress Notes (Signed)
Inpatient Rehabilitation Medication Review by a Pharmacist  A complete drug regimen review was completed for this patient to identify any potential clinically significant medication issues.  Clinically significant medication issues were identified:  no  Check AMION for pharmacist assigned to patient if future medication questions/issues arise during this admission.  Pharmacist comments:   Time spent performing this drug regimen review (minutes): 10 minutes   Sanda Klein, PharmD, RPh  PGY-1 Pharmacy Resident 01/20/2021 1:09 PM  Please check [email or web address] for unit-specific pharmacy phone numbers.

## 2021-01-20 NOTE — Progress Notes (Signed)
PROGRESS NOTE   Subjective/Complaints:    Objective:   ECHOCARDIOGRAM COMPLETE  Result Date: 01/18/2021    ECHOCARDIOGRAM REPORT   Patient Name:   Stanley Wells Date of Exam: 01/18/2021 Medical Rec #:  [id number]         Height:       71.0 in Accession #:    [id number]        Weight:       228.6 lb Date of Birth:  12/12/1945          BSA:          2.232 m Patient Age:    75 years          BP:           160/107 mmHg Patient Gender: M                 HR:           60 bpm. Exam Location:  Inpatient Procedure: 2D Echo, Cardiac Doppler, Color Doppler and Intracardiac            Opacification Agent Indications:    Stroke  History:        Patient has prior history of Echocardiogram examinations, most                 recent 03/12/2020. CHF, CAD, Prior CABG, Arrythmias:Atrial                 Fibrillation; Risk Factors:Dyslipidemia, Hypertension, Sleep                 Apnea and Former Smoker. S/p ablation.  Sonographer:    Ross Ludwig RDCS (AE) Referring Phys: [id number] [place] IMPRESSIONS  1. Left ventricular ejection fraction, by estimation, is 60 to 65%. The left ventricle has normal function. The left ventricle has no regional wall motion abnormalities. There is severe concentric left ventricular hypertrophy. Left ventricular diastolic  parameters are indeterminate.  2. Right ventricular systolic function is normal. The right ventricular size is normal.  3. Left atrial size was mildly dilated.  4. Right atrial size was moderately dilated.  5. The mitral valve is normal in structure. No evidence of mitral valve regurgitation. No evidence of mitral stenosis.  6. The aortic valve is normal in structure. Aortic valve regurgitation is not visualized. No aortic stenosis is present.  7. Aortic dilatation noted. FINDINGS  Left Ventricle: Left ventricular ejection fraction, by estimation, is 60 to 65%. The left ventricle has normal function. The left  ventricle has no regional wall motion abnormalities. Definity contrast agent was given IV to delineate the left ventricular  endocardial borders. The left ventricular internal cavity size was small. There is severe concentric left ventricular hypertrophy. Left ventricular diastolic parameters are indeterminate. Right Ventricle: The right ventricular size is normal. Right vetricular wall thickness was not well visualized. Right ventricular systolic function is normal. Left Atrium: Left atrial size was mildly dilated. Right Atrium: Right atrial size was moderately dilated. Pericardium: There is no evidence of pericardial effusion. Mitral Valve: The mitral valve is normal in structure. No evidence of mitral valve regurgitation. No evidence of mitral valve stenosis. Tricuspid  Valve: The tricuspid valve is grossly normal. Tricuspid valve regurgitation is not demonstrated. Aortic Valve: The aortic valve is normal in structure. Aortic valve regurgitation is not visualized. No aortic stenosis is present. Aortic valve mean gradient measures 2.0 mmHg. Aortic valve peak gradient measures 4.0 mmHg. Aortic valve area, by VTI measures 3.47 cm. Pulmonic Valve: The pulmonic valve was grossly normal. Pulmonic valve regurgitation is not visualized. Aorta: Aortic dilatation noted. IAS/Shunts: The atrial septum is grossly normal.  LEFT VENTRICLE PLAX 2D LVIDd:         5.30 cm  Diastology LVIDs:         4.10 cm  LV e' medial:    6.74 cm/s LV PW:         1.70 cm  LV E/e' medial:  9.2 LV IVS:        1.50 cm  LV e' lateral:   7.51 cm/s LVOT diam:     2.40 cm  LV E/e' lateral: 8.3 LV SV:         74 LV SV Index:   33 LVOT Area:     4.52 cm  RIGHT VENTRICLE            IVC RV Basal diam:  3.30 cm    IVC diam: 1.00 cm RV S prime:     8.81 cm/s TAPSE (M-mode): 1.7 cm LEFT ATRIUM              Index       RIGHT ATRIUM           Index LA diam:        5.10 cm  2.29 cm/m  RA Area:     25.80 cm LA Vol (A2C):   65.5 ml  29.35 ml/m RA Volume:    78.20 ml  35.04 ml/m LA Vol (A4C):   104.0 ml 46.60 ml/m LA Biplane Vol: 88.0 ml  39.43 ml/m  AORTIC VALVE AV Area (Vmax):    3.97 cm AV Area (Vmean):   3.63 cm AV Area (VTI):     3.47 cm AV Vmax:           100.00 cm/s AV Vmean:          68.800 cm/s AV VTI:            0.214 m AV Peak Grad:      4.0 mmHg AV Mean Grad:      2.0 mmHg LVOT Vmax:         87.70 cm/s LVOT Vmean:        55.200 cm/s LVOT VTI:          0.164 m LVOT/AV VTI ratio: 0.77  AORTA Ao Root diam: 3.70 cm Ao Asc diam:  3.40 cm MITRAL VALVE MV Area (PHT): 2.02 cm    SHUNTS MV Decel Time: 375 msec    Systemic VTI:  0.16 m MV E velocity: 62.00 cm/s  Systemic Diam: 2.40 cm MV A velocity: 39.40 cm/s MV E/A ratio:  1.57 Kristeen MissPhilip Nahser MD Electronically signed by Kristeen MissPhilip Nahser MD Signature Date/Time: 01/18/2021/4:32:17 PM    Final    Recent Labs    01/20/21 0020  WBC 9.7  HGB 14.4  HCT 43.0  PLT 197   Recent Labs    01/20/21 0020  NA 138  K 3.7  CL 100  CO2 29  GLUCOSE 120*  BUN 20  CREATININE 1.31*  CALCIUM 9.4    Intake/Output Summary (Last 24 hours) at 01/20/2021 0936 Last data  filed at 01/20/2021 0721 Gross per 24 hour  Intake 660 ml  Output --  Net 660 ml        Physical Exam: Vital Signs Blood pressure (!) 170/98, pulse 64, temperature 97.7 F (36.5 C), temperature source Oral, resp. rate (!) 22, height 5\' 11"  (1.803 m), weight 101.5 kg, SpO2 98 %.   General: No acute distress Mood and affect are appropriate Heart: Regular rate and rhythm no rubs murmurs or extra sounds Lungs: Clear to auscultation, breathing unlabored, no rales or wheezes Abdomen: Positive bowel sounds, soft nontender to palpation, nondistended Extremities: No clubbing, cyanosis, or edema Skin: No evidence of breakdown, no evidence of rash Neurologic: Cranial nerves II through XII intact, motor strength is 5/5 in RIght and 4/5 Leftdeltoid, bicep, tricep, grip, hip flexor, knee extensors, ankle dorsiflexor and plantar flexor Sensory exam  normal sensation to light touch and proprioception in bilateral upper and lower extremities Cerebellar exam mild dysmetria Left FNF Musculoskeletal: Full range of motion in all 4 extremities. No joint swelling   Assessment/Plan: 1. Functional deficits which require 3+ hours per day of interdisciplinary therapy in a comprehensive inpatient rehab setting. Physiatrist is providing close team supervision and 24 hour management of active medical problems listed below. Physiatrist and rehab team continue to assess barriers to discharge/monitor patient progress toward functional and medical goals  Care Tool:  Bathing    Body parts bathed by patient: Right arm, Left arm, Chest, Abdomen, Front perineal area, Buttocks, Right upper leg, Left upper leg, Right lower leg, Left lower leg, Face         Bathing assist Assist Level: Minimal Assistance - Patient > 75%     Upper Body Dressing/Undressing Upper body dressing   What is the patient wearing?: Pull over shirt    Upper body assist Assist Level: Set up assist    Lower Body Dressing/Undressing Lower body dressing      What is the patient wearing?: Underwear/pull up, Pants     Lower body assist       Toileting Toileting    Toileting assist Assist for toileting: Contact Guard/Touching assist     Transfers Chair/bed transfer  Transfers assist     Chair/bed transfer assist level: Minimal Assistance - Patient > 75%     Locomotion Ambulation   Ambulation assist      Assist level: Minimal Assistance - Patient > 75% Assistive device: No Device Max distance: 15'   Walk 10 feet activity   Assist     Assist level: Contact Guard/Touching assist Assistive device: Walker-rolling   Walk 50 feet activity   Assist           Walk 150 feet activity   Assist           Walk 10 feet on uneven surface  activity   Assist           Wheelchair     Assist               Wheelchair 50 feet  with 2 turns activity    Assist            Wheelchair 150 feet activity     Assist          Blood pressure (!) 170/98, pulse 64, temperature 97.7 F (36.5 C), temperature source Oral, resp. rate (!) 22, height 5\' 11"  (1.803 m), weight 101.5 kg, SpO2 98 %.  Medical Problem List and Plan: 1.  Decreased self-care and mobility secondary to  right internal capsule infarct             -patient may  shower             -ELOS/Goals: 5 to 7 days CIR evals PT< OT 2.  Antithrombotics: -DVT/anticoagulation:  Pharmaceutical: Other (comment)--Eliquis             -antiplatelet therapy: Low dose ASA added 06/16 3. Pain Management: N/A 4. Mood: LCSW to follow for evaluation and support.              -antipsychotic agents: N/A 5. Neuropsych: This patient is capable of making decisions on his own behalf. 6. Skin/Wound Care:  Routine pressure relief measures.  7. Fluids/Electrolytes/Nutrition: Monitor I/O.drinking well d/c IV 8. H/o A fib/Aflutter: Monitor HR TID.  Vitals:   01/19/21 1958 01/20/21 0330  BP: (!) 140/95 (!) 170/98  Pulse: 71 64  Resp:  (!) 22  Temp:  97.7 F (36.5 C)  SpO2:  98%  Permissive HTN keep pressures < 200/110 until ~1wk post CVA, may need to adjust meds then 9. CAD s/p CABG 2003: Continue Toprol XL, Crestor, Entesto, Imdur and ASA --Monitor for symptoms with increase in activity 10. CM /Chronic systolic CHF: EF 41-36%. Will monitor for signs of overload and check weights daily.             --heart healthy diet.             --continue Entresto, Imdur, Metoprolol and Crestor.  Filed Weights   01/19/21 1618 01/20/21 0414  Weight: 102.2 kg 101.5 kg    11. T2DM: Hgb A1C-6.7 - now diabetic-->Add CM restrictions. --Will monitor BS ac/hs and use SSI for now CBG (last 3)  Recent Labs    01/19/21 1735 01/19/21 2107 01/20/21 0602  GLUCAP 100* 148* 113*  Has not received insulin cbgs nl will dc cbg  12.OSA: Has refused CPAP    LOS: 1 days A FACE TO  FACE EVALUATION WAS PERFORMED  Erick Colace 01/20/2021, 9:36 AM

## 2021-01-20 NOTE — Progress Notes (Signed)
Kirsteins, Luanna Salk, MD   Physician  Nursing  PMR Pre-admission      Signed  Date of Service:  01/18/2021 11:54 AM       Related encounter: ED to Hosp-Admission (Discharged) from 01/16/2021 in [facility]       Signed                                                                                                                                                                                                                                                                                                                                                                                                                                                         PMR Admission Coordinator Pre-Admission Assessment   Patient: Stanley Wells is an 75 y.o., male MRN: [id number] DOB: 28-Jan-1946 Height:   Weight:     Insurance Information HMO:     PPO:      PCP:      IPA:      80/20:      OTHER: PRIMARY: Medicare A and B      Policy#: [id number]      Subscriber: patient CM Name:        Phone#:       Fax#:   Pre-Cert#:        Employer:  Retired Benefits:  Phone #:       Name: Checked in [place] [employer] Date: 08/05/11     Deduct: $1556      Out of Pocket Max: none      Life Max: N/A CIR: 100%      SNF: 100 days Outpatient: 80%     Co-Pay: 20% Home Health: 100%      Co-Pay: none DME: 80%     Co-Pay: 20% Providers: patient's choice  SECONDARY: BCBS supplement      Policy#: [id number]     Phone#: [phone]   Financial Counselor:        Phone#:     The "Data Collection Information Summary" for patients in Inpatient Rehabilitation Facilities with attached "Privacy Act [place] Records" was provided and verbally reviewed with: Patient and Family   Emergency Contact Information Contact Information       Name  Relation Home Work Mobile    [place] Spouse [phone]   [phone]    Corie Chiquito Daughter     [phone]    Rosalio, [place]     [phone]           Current Medical History  Patient Admitting Diagnosis: Lacunar infarct posterior R IC   History of Present Illness: A 75 y.o. male past medical history of atrial fibrillation/flutter on Eliquis, coronary artery disease status post CABG in 2003, chronic combined systolic and diastolic heart failure, hypertension, hyperlipidemia, severe mitral regurgitation and tricuspid regurgitation, presented to the emergency room for evaluation of left-sided weakness with last known well on the Friday, 01/11/2021 when he went to bed around 9 PM.  He woke up Saturday morning and was attempting to get out of bed but fell down because he could not support his weight on his left leg.  He was having a hard time falling requiring support to walk even small distances at home.  He waited a few days before coming into the emergency room today for evaluation because of symptoms, did improve some but did not resolve.  The wife also said that he kept dropping a bottle drink from his left hand.  No facial weakness.  No slurred speech.  No headaches.  No chest pain or shortness of breath reported.  No preceding illnesses or sicknesses.  No prior history of strokes that the patient knows of.  Has had ablation of his atrial flutter 6 months ago and multiple cardioversions.  Remains on Eliquis-reports compliance to medication.  PT/OT evaluations completed with recommendations for CIR.  SLP evaluation pending 01/18/21.   Patient's medical record from [facility] has been reviewed by the rehabilitation admission coordinator and physician.   Past Medical History      Past Medical History:  Diagnosis Date   Atrial flutter with rapid ventricular response ([place]) 07/23/2018   CAD (coronary artery disease)      a. s/p CABG 2003  b. s/p acute MI prior to CABG w/ EF <30%    Cardiomyopathy, ischemic: EF 20-25%     Cholecystitis     Chronic combined systolic and diastolic CHF (congestive heart failure) (HCC)     Chronic systolic CHF (congestive heart failure) ([place])      a. 2D ECHO: 05/17/2014: EF 30-35%;  b. 07/2015 TEE: EF 20-25%, diff HK, sev MR, sev dil LA w/o thrombus, mod reduced RV fxn, mod dil RA, mod TR.   Hyperlipidemia     Hypertensive heart disease     Kidney stones X 1    "  passed it" (08/02/2015)   OSA (obstructive sleep apnea) 10/11/2014    refuses to use CPAP   Persistent atrial fibrillation ([place])      a. s/p succesful TEE/DCCV on 06/01/14.  b. on Eliquis;  c. 07/2015 recurrent AF-->Failed DCCV.   S/P CABG x 5 02/16/2002    LIMA to LAD, SVG to D1, SVG to OM2, Sequential SVG to PDA-RPL, saphenous vein harvest from right thigh and lower leg   Severe mitral regurgitation      Functional, improved with improvement in his EF.   Tricuspid regurgitation        Family History   family history includes Cancer in his father and mother.   Prior Rehab/Hospitalizations Has the patient had prior rehab or hospitalizations prior to admission? No   Has the patient had major surgery during 100 days prior to admission? No               Current Medications   Current Facility-Administered Medications:    stroke: mapping our early stages of recovery book, , Does not apply, Once, Nicolette Bang, MD   acetaminophen (TYLENOL) tablet 650 mg, 650 mg, Oral, Q4H PRN **OR** acetaminophen (TYLENOL) 160 MG/5ML solution 650 mg, 650 mg, Per Tube, Q4H PRN **OR** acetaminophen (TYLENOL) suppository 650 mg, 650 mg, Rectal, Q4H PRN, Nicolette Bang, MD   apixaban Arne Cleveland) tablet 5 mg, 5 mg, Oral, BID, Nicolette Bang, MD, 5 mg at 01/18/21 1001   aspirin EC tablet 81 mg, 81 mg, Oral, Daily, Rosalin Hawking, MD, 81 mg at 01/18/21 1001   isosorbide mononitrate (IMDUR) 24 hr tablet 30 mg, 30 mg, Oral, Daily, Nicolette Bang, MD, 30 mg at  01/18/21 1001   metoprolol succinate (TOPROL-XL) 24 hr tablet 75 mg, 75 mg, Oral, BID, Nicolette Bang, MD, 75 mg at 01/18/21 1001   rosuvastatin (CRESTOR) tablet 40 mg, 40 mg, Oral, QHS, Rosalin Hawking, MD, 40 mg at 01/17/21 2143   sacubitril-valsartan (ENTRESTO) 97-103 mg per tablet, 1 tablet, Oral, BID, Nicolette Bang, MD, 1 tablet at 01/18/21 1000   senna-docusate (Senokot-S) tablet 1 tablet, 1 tablet, Oral, QHS PRN, Nicolette Bang, MD   Patients Current Diet:  Diet Order                  Diet Heart Room service appropriate? No; Fluid consistency: Thin  Diet effective now                         Precautions / Restrictions Precautions Precautions: Fall Precaution Comments: Had fall on saturday when he first had symptoms Restrictions Weight Bearing Restrictions: No    Has the patient had 2 or more falls or a fall with injury in the past year? No   Prior Activity Level Community (5-7x/wk): Went out at least 4 days a week.   Prior Functional Level Self Care: Did the patient need help bathing, dressing, using the toilet or eating? Independent   Indoor Mobility: Did the patient need assistance with walking from room to room (with or without device)? Independent   Stairs: Did the patient need assistance with internal or external stairs (with or without device)? Independent   Functional Cognition: Did the patient need help planning regular tasks such as shopping or remembering to take medications? Independent   Home Assistive Devices / Equipment Home Assistive Devices/Equipment: None Home Equipment: None   Prior Device Use: Indicate devices/aids used by the patient prior to current illness,  exacerbation or injury? None of the above   Current Functional Level Cognition   Overall Cognitive Status: No family/caregiver present to determine baseline cognitive functioning General Comments: Low knowledge base for healthcare issues. Likely close to  baseline.  He demontrates poor awareness of deficits and impaired problem solving    Extremity Assessment (includes Sensation/Coordination)   Upper Extremity Assessment: LUE deficits/detail LUE Deficits / Details: mild dysmetria  Lower Extremity Assessment: LLE deficits/detail LLE Deficits / Details: Decreased coordnation and functional weakness noted with increased distance. LLE Coordination: decreased gross motor     ADLs   Overall ADL's : Needs assistance/impaired Eating/Feeding: Modified independent, Sitting, Bed level Grooming: Wash/dry hands, Oral care, Wash/dry face, Brushing hair, Minimal assistance, Standing Upper Body Bathing: Set up, Sitting Lower Body Bathing: Moderate assistance, Sit to/from stand Upper Body Dressing : Set up, Supervision/safety, Sitting Lower Body Dressing: Moderate assistance, Sit to/from stand Toilet Transfer: Moderate assistance, Ambulation, Comfort height toilet, RW Toileting- Clothing Manipulation and Hygiene: Moderate assistance, Sit to/from stand Functional mobility during ADLs: Moderate assistance, +2 for safety/equipment, +2 for physical assistance     Mobility   Overal bed mobility: Needs Assistance Bed Mobility: Supine to Sit, Sit to Supine Supine to sit: Min assist Sit to supine: Min assist General bed mobility comments: Required assist for trunk and LE assist.     Transfers   Overall transfer level: Needs assistance Equipment used: 2 person hand held assist Transfers: Sit to/from Stand Sit to Stand: Min assist, +2 physical assistance General transfer comment: Min A + 2 for lift assist     Ambulation / Gait / Stairs / Wheelchair Mobility   Ambulation/Gait Ambulation/Gait assistance: Min assist, Mod assist, +2 physical assistance Gait Distance (Feet): 50 Feet Assistive device: 2 person hand held assist Gait Pattern/deviations: Step-through pattern, Ataxic, Decreased stride length General Gait Details: Pt initially requiring min A +2,  however, as gait progressed, had increased unsteadiness requiring heavy mod A  +2 for steadying. Functional weakness noted in LLE and LLE began to buckle on pt. Gait velocity: Decreased     Posture / Balance Balance Overall balance assessment: Needs assistance Sitting-balance support: No upper extremity supported, Feet supported Sitting balance-Leahy Scale: Fair Standing balance support: Bilateral upper extremity supported, During functional activity Standing balance-Leahy Scale: Poor Standing balance comment: Reliant on BUE support     Special needs/care consideration None    Previous Home Environment (from acute therapy documentation) Living Arrangements: Spouse/significant other, Parent  Lives With: Spouse Available Help at Discharge: Family Type of Home: House Home Layout: One level Home Access: Stairs to enter Entrance Stairs-Rails: Right Entrance Stairs-Number of Steps: 4 Bathroom Shower/Tub: Multimedia programmer: Standard   Discharge Living Setting Plans for Discharge Living Setting: Patient's home, House, Lives with (comment) (Lives with wife) Type of Home at Discharge: House Discharge Home Layout: One level Discharge Home Access: Stairs to enter Entrance Stairs-Rails: Left Entrance Stairs-Number of Steps: 4-5 Discharge Bathroom Shower/Tub: Walk-in shower, Door Discharge Bathroom Toilet: Standard Discharge Bathroom Accessibility: No   Social/Family/Support Systems Patient Roles: Spouse, Parent (Wife at the bedside) Contact Information: Nil Xiong - wife - [phone] Anticipated Caregiver: wife and patient Ability/Limitations of Caregiver: Wife and patient are retired Careers adviser: 24/7 (Wife is small frame petite woman.) Discharge Plan Discussed with Primary Caregiver: Yes Is Caregiver In Agreement with Plan?: Yes Does Caregiver/Family have Issues with Lodging/Transportation while Pt is in Rehab?: No   Goals Patient/Family Goal for  Rehab: PT/OT mod I and supervision goals Expected length  of stay: 7 days Cultural Considerations: none Pt/Family Agrees to Admission and willing to participate: Yes Program Orientation Provided & Reviewed with Pt/Caregiver Including Roles  & Responsibilities: Yes   Decrease burden of Care through IP rehab admission: N/A   Possible need for SNF placement upon discharge: Not anticipated   Patient Condition: I have reviewed medical records from [facility], spoken with CM, and patient and family member. I met with patient at the bedside for inpatient rehabilitation assessment.  Patient will benefit from ongoing PT, OT, and SLP, can actively participate in 3 hours of therapy a day 5 days of the week, and can make measurable gains during the admission.  Patient will also benefit from the coordinated team approach during an Inpatient Acute Rehabilitation admission.  The patient will receive intensive therapy as well as Rehabilitation physician, nursing, social worker, and care management interventions.  Due to safety, disease management, medication administration, and patient education the patient requires 24 hour a day rehabilitation nursing.  The patient is currently min to mod assist with mobility and basic ADLs.  Discharge setting and therapy post discharge at home with home health is anticipated.  Patient has agreed to participate in the Acute Inpatient Rehabilitation Program and will admit tomorrow.   Preadmission Screen Completed By:  Retta Diones, 01/18/2021 12:05 PM ______________________________________________________________________   Discussed status with Dr. Naaman Plummer on 01/18/21 at 0930 and received approval for admission tomorrow.   Admission Coordinator:  Retta Diones, RN, time 1205/Date 01/18/21    Assessment/Plan: Diagnosis:RIght int capsule infarct Does the need for close, 24 hr/day Medical supervision in concert with the patient's rehab needs make it unreasonable for this patient  to be served in a less intensive setting? Yes Co-Morbidities requiring supervision/potential complications: CHF,AFib, HTN Due to bladder management, bowel management, safety, skin/wound care, disease management, medication administration, pain management, and patient education, does the patient require 24 hr/day rehab nursing? Yes Does the patient require coordinated care of a physician, rehab nurse, PT, OT, and SLP to address physical and functional deficits in the context of the above medical diagnosis(es)? Yes Addressing deficits in the following areas: balance, endurance, locomotion, strength, transferring, bowel/bladder control, bathing, dressing, toileting, and psychosocial support Can the patient actively participate in an intensive therapy program of at least 3 hrs of therapy 5 days a week? Yes The potential for patient to make measurable gains while on inpatient rehab is excellent Anticipated functional outcomes upon discharge from inpatient rehab: modified independent PT, modified independent OT, n/a SLP Estimated rehab length of stay to reach the above functional goals is: 5-7d Anticipated discharge destination: Home 10. Overall Rehab/Functional Prognosis: excellent     MD Signature: "I have personally performed a face to face diagnostic evaluation of this patient.  Additionally, I have reviewed and concur with the physician assistant's documentation above."            Revision History                             Note Details  Author Charlett Blake, MD File Time 01/19/2021 10:43 AM  Author Type Physician Status Signed  Last Editor Charlett Blake, [place] # [id number] Admit Date 01/19/2021

## 2021-01-21 LAB — GLUCOSE, CAPILLARY
Glucose-Capillary: 114 mg/dL — ABNORMAL HIGH (ref 70–99)
Glucose-Capillary: 111 mg/dL — ABNORMAL HIGH (ref 70–99)
Glucose-Capillary: 92 mg/dL (ref 70–99)
Glucose-Capillary: 156 mg/dL — ABNORMAL HIGH (ref 70–99)

## 2021-01-21 NOTE — Progress Notes (Signed)
Physical Therapy Session Note  Patient Details  Name: Stanley [name]: [id number] Date of Birth: [birth year]  Today's Date: 01/21/2021 PT Individual Time: 1403-1500 PT Individual Time Calculation (min): 57 min   Short Term Goals: Week 1:  PT Short Term Goal 1 (Week 1): =LTG due to ELOS  Skilled Therapeutic Interventions/Progress Updates: Pt presents supine in bed and agreeable to therapy.  Pt transfers sup to sit w/ supervision, but required 2 attempts as just pulls self up using B rails.  Pt educated on transfer requiring decreased effort.  Pt transferred sit to stand and then amb in hallways w/o AD and CGA.  Pt amb w/ flat-footed gait pattern and decreased step length.  Pt and spouse both state that this was similar to how he was amb PTA.  Pt performed Nu-step 5' x 2 at Level 3 to improve reciprocal movement of LEs.  Pt amb 150' into main gym w/ CGA.  Pt performed obstacle course around 6 cones and stepping over hockey stick .  Pt performed sit to stand w/o UEs, holding large T-band.  Pt walked back to room and transferred into bed w/ supervision.  Bed alarm on and all needs in reach.       Therapy Documentation Precautions:  Precautions Precautions: Fall Precaution Comments: Had fall on saturday when he first had symptoms Restrictions Weight Bearing Restrictions: No General:   Vital Signs: Therapy Vitals Temp: 97.6 F (36.4 C) Pulse Rate: (!) 58 Resp: 17 BP: 125/78 Patient Position (if appropriate): Lying Oxygen Therapy SpO2: 100 % O2 Device: Room Air Pain:0/10      Therapy/Group: Individual Therapy  [name] 01/21/2021, 4:05 PM

## 2021-01-21 NOTE — Progress Notes (Signed)
Patient refused CPAP for tonight. Patient stated he tried it before and he could not tolerate it.

## 2021-01-21 NOTE — Progress Notes (Signed)
Inpatient Rehabilitation Center Individual Statement of Services  Patient Name:  RANDEEP BIONDOLILLO  Date:  01/21/2021  Welcome to the Inpatient Rehabilitation Center.  Our goal is to provide you with an individualized program based on your diagnosis and situation, designed to meet your specific needs.  With this comprehensive rehabilitation program, you will be expected to participate in at least 3 hours of rehabilitation therapies Monday-Friday, with modified therapy programming on the weekends.  Your rehabilitation program will include the following services:  Physical Therapy (PT), Occupational Therapy (OT), Speech Therapy (ST), 24 hour per day rehabilitation nursing, Care Coordinator, Rehabilitation Medicine, Nutrition Services, and Pharmacy Services  Weekly team conferences will be held on Tuesday to discuss your progress.  Your Inpatient Rehabilitation Care Coordinator will talk with you frequently to get your input and to update you on team discussions.  Team conferences with you and your family in attendance may also be held.  Expected length of stay: 5-8 days  Overall anticipated outcome: independent with device  Depending on your progress and recovery, your program may change. Your Inpatient Rehabilitation Care Coordinator will coordinate services and will keep you informed of any changes. Your Inpatient Rehabilitation Care Coordinator's name and contact numbers are listed  below.  The following services may also be recommended but are not provided by the Inpatient Rehabilitation Center:  Driving Evaluations Home Health Rehabiltiation Services Outpatient Rehabilitation Services    Arrangements will be made to provide these services after discharge if needed.  Arrangements include referral to agencies that provide these services.  Your insurance has been verified to be:  Medicare & BCBS Your primary doctor is:  Gwendlyn Deutscher  Pertinent information will be shared with your doctor and  your insurance company.  Inpatient Rehabilitation Care Coordinator:  Dossie Der, [place] [phone] or Luna Glasgow  Information discussed with and copy given to patient by: Lucy Chris, 01/21/2021, 12:50 PM

## 2021-01-21 NOTE — Progress Notes (Signed)
Inpatient Rehabilitation Care Coordinator Assessment and Plan Patient Details  Name: JAYDIS DUCHENE MRN: [id number] Date of Birth: 05/21/1946  Today's Date: 01/21/2021  Hospital Problems: Principal Problem:   Subcortical infarction [facility]) Active Problems:   Embolic stroke of right basal ganglia [facility])  Past Medical History:  Past Medical History:  Diagnosis Date   Atrial flutter with rapid ventricular response (HCC) 07/23/2018   CAD (coronary artery disease)    a. s/p CABG 2003  b. s/p acute MI prior to CABG w/ EF <30%   Cardiomyopathy, ischemic: EF 20-25%    Cholecystitis    Chronic combined systolic and diastolic CHF (congestive heart failure) (HCC)    Chronic systolic CHF (congestive heart failure) (HCC)    a. 2D ECHO: 05/17/2014: EF 30-35%;  b. 07/2015 TEE: EF 20-25%, diff HK, sev MR, sev dil LA w/o thrombus, mod reduced RV fxn, mod dil RA, mod TR.   Hyperlipidemia    Hypertensive heart disease    Kidney stones X 1   "passed it" (08/02/2015)   OSA (obstructive sleep apnea) 10/11/2014   refuses to use CPAP   Persistent atrial fibrillation (HCC)    a. s/p succesful TEE/DCCV on 06/01/14.  b. on Eliquis;  c. 07/2015 recurrent AF-->Failed DCCV.   S/P CABG x 5 02/16/2002   LIMA to LAD, SVG to D1, SVG to OM2, Sequential SVG to PDA-RPL, saphenous vein harvest from right thigh and lower leg   Severe mitral regurgitation    Functional, improved with improvement in his EF.   Tricuspid regurgitation    Past Surgical History:  Past Surgical History:  Procedure Laterality Date   ATRIAL FIBRILLATION ABLATION N/A 04/11/2020   Procedure: ATRIAL FIBRILLATION ABLATION;  Surgeon: Lanier Prude, MD;  [facility] INVASIVE CV LAB;  Service: Cardiovascular;  Laterality: N/A;   CARDIAC CATHETERIZATION N/A 06/19/2014   Procedure: RIGHT/LEFT HEART CATH AND CORONARY/GRAFT ANGIOGRAPHY;  Surgeon: Lennette Bihari, MD;  Location: [facility] CATH LAB;  Service: Cardiovascular;  Laterality: N/A;   CARDIAC  CATHETERIZATION  2003   CARDIAC CATHETERIZATION N/A 08/28/2015   Procedure: Right Heart Cath and Coronary/Graft Angiography;  Surgeon: Laurey Morale, MD;  Location: [facility] INVASIVE CV LAB;  Service: Cardiovascular;  Laterality: N/A;   CARDIOVERSION N/A 06/01/2014   Procedure: CARDIOVERSION;  Surgeon: Lars Masson, MD;  Location: [facility] ENDOSCOPY;  Service: Cardiovascular;  Laterality: N/A;   CARDIOVERSION  06/11/2015; 08/02/2015   CARDIOVERSION N/A 08/02/2015   Procedure: CARDIOVERSION;  Surgeon: Wendall Stade, MD;  Location: [facility] ENDOSCOPY;  Service: Cardiovascular;  Laterality: N/A;   CARDIOVERSION N/A 10/22/2015   Procedure: CARDIOVERSION;  Surgeon: Laurey Morale, MD;  Location: [facility] ENDOSCOPY;  Service: Cardiovascular;  Laterality: N/A;   CARDIOVERSION N/A 03/15/2020   Procedure: CARDIOVERSION;  Surgeon: Laurey Morale, MD;  Location: [facility] ENDOSCOPY;  Service: Cardiovascular;  Laterality: N/A;   CARDIOVERSION N/A 05/14/2020   Procedure: CARDIOVERSION;  Surgeon: [name], MD;  Location: [facility] ENDOSCOPY;  Service: Cardiovascular;  Laterality: N/A;   CORONARY ARTERY BYPASS GRAFT  02/16/2002   CABG X5   LAPAROSCOPIC APPENDECTOMY N/A 07/20/2016   Procedure: APPENDECTOMY LAPAROSCOPIC;  Surgeon: Manus Rudd, MD;  Location: [facility] OR;  Service: General;  Laterality: N/A;   TEE WITHOUT CARDIOVERSION N/A 06/01/2014   Procedure: TRANSESOPHAGEAL ECHOCARDIOGRAM (TEE);  Surgeon: Lars Masson, MD;  Location: [facility] ENDOSCOPY;  Service: Cardiovascular;  Laterality: N/A;   TEE WITHOUT CARDIOVERSION N/A 08/03/2015   Procedure: TRANSESOPHAGEAL ECHOCARDIOGRAM (TEE);  Surgeon: Lars Masson, MD;  Location: [facility] ENDOSCOPY;  Service: Cardiovascular;  Laterality: N/A;   Social History:  reports that he quit smoking about 18 years ago. His smoking use included cigarettes. He has a 40.00 pack-year smoking history. He has never used smokeless tobacco. He reports current alcohol use. He reports that he does not use  drugs.  Family / Support Systems Marital Status: Married Patient Roles: Spouse, Parent Spouse/Significant OtherTomasa Hose  [phone] Children: Kelli-daughter [phone]  Vanita Panda [phone] Other Supports: Friends and church members Anticipated Caregiver: Wife Ability/Limitations of Caregiver: Wife is in good health and can assist if pt needs assist Caregiver Availability: 24/7 Family Dynamics: Close knit with chidlren who are involved and supportive. Pt feels forunate to have his family and feels will do fine when leaves here.  Social History Preferred language: English Religion: Baptist Cultural Background: No issues Education: HS Read: Yes Write: Yes Employment Status: Retired Marine scientist Issues: No issues Guardian/Conservator: None-according to MD pt is capable of making his own decisions while here   Abuse/Neglect Abuse/Neglect Assessment Can Be Completed: Yes Physical Abuse: Denies Verbal Abuse: Denies Sexual Abuse: Denies Exploitation of patient/patient's resources: Denies Self-Neglect: Denies  Emotional Status Pt's affect, behavior and adjustment status: Pt is motivated to recover from this stroke and feels he has and hopes to go home soon. His wife is able to assist if needed. Recent Psychosocial Issues: other health issues Psychiatric History: No issues-deferred depression screen due to coping appropriately Substance Abuse History: No issues  Patient / Family Perceptions, Expectations & Goals Pt/Family understanding of illness & functional limitations: Pt and wife can explain his stroke and deficits that are resolving. He plans to be independent at DC and feels this is a realistic goal for him here. Premorbid pt/family roles/activities: husband, father, retiree, church member, etc Anticipated changes in roles/activities/participation: resume Pt/family expectations/goals: Pt states: " I hope to be able to take care of myself when I leave  here."  Wife states: " I will be there if he needs anything."  [employer]: None Premorbid Home Care/DME Agencies: None Transportation available at discharge: wife  Discharge Planning Living Arrangements: Spouse/significant other Support Systems: Spouse/significant other, Children, Manufacturing engineer, Psychologist, clinical community Type of Residence: Private residence Community education officer Resources: [employer], Media planner (specify) Herbalist) Financial Resources: Tree surgeon, Family Support Financial Screen Referred: No Living Expenses: Lives with family Money Management: Patient, Spouse Does the patient have any problems obtaining your medications?: No Home Management: Both Patient/Family Preliminary Plans: Return home with wife who is retired and able to assist if needed. He is hoping he will not need assistance at discharge. Aware team conference tomorrow and target DC date will be set. Care Coordinator Anticipated Follow Up Needs: HH/OP  Clinical Impression: Pleasatn gentleman who is motivated to do well and is recovering from his stroke. His wife is supportive and involved and can offer assistance at discharge. Continue to follow to assist with discharge needs.  Lucy Chris 01/21/2021, 1:26 PM

## 2021-01-21 NOTE — Plan of Care (Signed)
Nutrition Education Note  RD consulted for nutrition diet education.   Lipid Panel     Component Value Date/Time   CHOL 107 01/17/2021 0344   TRIG 167 (H) 01/17/2021 0344   HDL 33 (L) 01/17/2021 0344   CHOLHDL 3.2 01/17/2021 0344   VLDL 33 01/17/2021 0344   LDLCALC 41 01/17/2021 0344    Family at bedside. RD provided "Heart Healthy Consistent Carbohydrate Nutrition Therapy" handout from the Academy of Nutrition and Dietetics. Reviewed patient's dietary recall. Provided examples on ways to decrease sodium and fat intake in diet. Discouraged intake of processed foods and use of salt shaker. Encouraged fresh fruits and vegetables as well as whole grain sources of carbohydrates to maximize fiber intake. Discussed diabetic friendly drink options.Teach back method used.  Expect good compliance.  Current diet order is heart healthy/carbohydrate modified, patient is consuming approximately 100% of meals at this time. Labs and medications reviewed. No further nutrition interventions warranted at this time. RD contact information provided. If additional nutrition issues arise, please re-consult RD.  Roslyn Smiling, MS, RD, LDN RD pager number/after hours weekend pager number on Amion.

## 2021-01-21 NOTE — Progress Notes (Signed)
PROGRESS NOTE   Subjective/Complaints: Refused CPAP last night Cr 1.31. Uptrending. Encourage hydration.  Patient's chart reviewed- No other issues reported overnight BP elevated, bradycardic  Objective:   No results found. Recent Labs    01/20/21 0020  WBC 9.7  HGB 14.4  HCT 43.0  PLT 197   Recent Labs    01/20/21 0020  NA 138  K 3.7  CL 100  CO2 29  GLUCOSE 120*  BUN 20  CREATININE 1.31*  CALCIUM 9.4    Intake/Output Summary (Last 24 hours) at 01/21/2021 0823 Last data filed at 01/21/2021 0755 Gross per 24 hour  Intake 780 ml  Output --  Net 780 ml        Physical Exam: Vital Signs Blood pressure (!) 178/89, pulse (!) 54, temperature 97.8 F (36.6 C), temperature source Oral, resp. rate 19, height 5\' 11"  (1.803 m), weight 100.8 kg, SpO2 97 %. Gen: no distress, normal appearing HEENT: oral mucosa pink and moist, NCAT Cardio:  Bradycardic Chest: normal effort, normal rate of breathing Abd: soft, non-distended Ext: no edema Neurologic: Cranial nerves II through XII intact, motor strength is 5/5 in RIght and 4/5 Leftdeltoid, bicep, tricep, grip, hip flexor, knee extensors, ankle dorsiflexor and plantar flexor Sensory exam normal sensation to light touch and proprioception in bilateral upper and lower extremities Cerebellar exam mild dysmetria Left FNF Musculoskeletal: Full range of motion in all 4 extremities. No joint swelling   Assessment/Plan: 1. Functional deficits which require 3+ hours per day of interdisciplinary therapy in a comprehensive inpatient rehab setting. Physiatrist is providing close team supervision and 24 hour management of active medical problems listed below. Physiatrist and rehab team continue to assess barriers to discharge/monitor patient progress toward functional and medical goals  Care Tool:  Bathing    Body parts bathed by patient: Right arm, Left arm, Chest, Abdomen,  Front perineal area, Buttocks, Right upper leg, Left upper leg, Right lower leg, Left lower leg, Face         Bathing assist Assist Level: Minimal Assistance - Patient > 75%     Upper Body Dressing/Undressing Upper body dressing   What is the patient wearing?: Pull over shirt    Upper body assist Assist Level: Set up assist    Lower Body Dressing/Undressing Lower body dressing      What is the patient wearing?: Underwear/pull up, Pants     Lower body assist       Toileting Toileting    Toileting assist Assist for toileting: Contact Guard/Touching assist     Transfers Chair/bed transfer  Transfers assist     Chair/bed transfer assist level: Contact Guard/Touching assist     Locomotion Ambulation   Ambulation assist      Assist level: Contact Guard/Touching assist Assistive device: No Device Max distance: 150'   Walk 10 feet activity   Assist     Assist level: Contact Guard/Touching assist Assistive device: No Device   Walk 50 feet activity   Assist    Assist level: Contact Guard/Touching assist Assistive device: No Device    Walk 150 feet activity   Assist    Assist level: Contact Guard/Touching assist Assistive device:  No Device    Walk 10 feet on uneven surface  activity   Assist     Assist level: Contact Guard/Touching assist Assistive device: Other (comment) (handrail)   Wheelchair     Assist Will patient use wheelchair at discharge?: No             Wheelchair 50 feet with 2 turns activity    Assist            Wheelchair 150 feet activity     Assist          Blood pressure (!) 178/89, pulse (!) 54, temperature 97.8 F (36.6 C), temperature source Oral, resp. rate 19, height 5\' 11"  (1.803 m), weight 100.8 kg, SpO2 97 %.  Medical Problem List and Plan: 1.  Decreased self-care and mobility secondary to right internal capsule infarct             -patient may  shower             -ELOS/Goals:  5 to 7 days Continue CIR evals PT< OT 2.  Antithrombotics: -DVT/anticoagulation:  Pharmaceutical: Other (comment)--Eliquis             -antiplatelet therapy: Low dose ASA added 06/16 3. Pain Management: N/A 4. Mood: LCSW to follow for evaluation and support.              -antipsychotic agents: N/A 5. Neuropsych: This patient is capable of making decisions on his own behalf. 6. Skin/Wound Care:  Routine pressure relief measures.  7. Fluids/Electrolytes/Nutrition: Monitor I/O.drinking well d/c IV 8. H/o A fib/Aflutter: Monitor HR TID.  Vitals:   01/20/21 2130 01/21/21 0512  BP: 140/88 (!) 178/89  Pulse: 64 (!) 54  Resp: 18 19  Temp: 98.2 F (36.8 C) 97.8 F (36.6 C)  SpO2: 97% 97%  Permissive HTN keep pressures < 200/110 until ~1wk post CVA, may need to adjust meds then 9. CAD s/p CABG 2003: Continue Toprol XL, Crestor, Entesto, Imdur and ASA --Monitor for symptoms with increase in activity 10. CM /Chronic systolic CHF: EF 2004. Will monitor for signs of overload and check weights daily.             --heart healthy diet.             --continue Entresto, Imdur, Metoprolol and Crestor.   6/20: weight reducing: Filed Weights   01/19/21 1618 01/20/21 0414 01/21/21 0512  Weight: 102.2 kg 101.5 kg 100.8 kg    11. T2DM: Hgb A1C-6.7 - now diabetic-->Add CM restrictions. --Will monitor BS ac/hs and use SSI for now CBG (last 3)  Recent Labs    01/20/21 1629 01/20/21 2128 01/21/21 0619  GLUCAP 113* 104* 114*  CBGs mildly elevated 6/20, provide dietary education.  12.OSA: Refusing CPAP, discuss benefits of using CPAP.  13. Bradycardia: continue to monitor HR TID    LOS: 2 days A FACE TO FACE EVALUATION WAS PERFORMED  7/20 Koi Yarbro 01/21/2021, 8:23 AM

## 2021-01-21 NOTE — Progress Notes (Signed)
Inpatient Rehabilitation  Patient information reviewed and entered into eRehab system by Nya Monds Ion Gonnella, OTR/L.   Information including medical coding, functional ability and quality indicators will be reviewed and updated through discharge.    

## 2021-01-21 NOTE — Evaluation (Signed)
Speech Language Pathology Assessment and Plan  Patient Details  Name: Stanley [name]: [id number] Date of Birth: 1945-11-25  SLP Diagnosis: Cognitive Impairments  Rehab Potential: Good ELOS: 7-10 days    Today's Date: 01/21/2021 SLP Individual Time: 0830-0930 SLP Individual Time Calculation (min): 98 min   Hospital Problem: Principal Problem:   Subcortical infarction [facility]) Active Problems:   Embolic stroke of right basal ganglia ([place])  Past Medical History:  Past Medical History:  Diagnosis Date   Atrial flutter with rapid ventricular response ([place]) 07/23/2018   CAD (coronary artery disease)    a. s/p CABG 2003  b. s/p acute MI prior to CABG w/ EF <30%   Cardiomyopathy, ischemic: EF 20-25%    Cholecystitis    Chronic combined systolic and diastolic CHF (congestive heart failure) ([place])    Chronic systolic CHF (congestive heart failure) ([place])    a. 2D ECHO: 05/17/2014: EF 30-35%;  b. 07/2015 TEE: EF 20-25%, diff HK, sev MR, sev dil LA w/o thrombus, mod reduced RV fxn, mod dil RA, mod TR.   Hyperlipidemia    Hypertensive heart disease    Kidney stones X 1   "passed it" (08/02/2015)   OSA (obstructive sleep apnea) 10/11/2014   refuses to use CPAP   Persistent atrial fibrillation ([place])    a. s/p succesful TEE/DCCV on 06/01/14.  b. on Eliquis;  c. 07/2015 recurrent AF-->Failed DCCV.   S/P CABG x 5 02/16/2002   LIMA to LAD, SVG to D1, SVG to OM2, Sequential SVG to PDA-RPL, saphenous vein harvest from right thigh and lower leg   Severe mitral regurgitation    Functional, improved with improvement in his EF.   Tricuspid regurgitation    Past Surgical History:  Past Surgical History:  Procedure Laterality Date   ATRIAL FIBRILLATION ABLATION N/A 04/11/2020   Procedure: ATRIAL FIBRILLATION ABLATION;  Surgeon: Vickie Epley, MD;  Location: [place] CV LAB;  Service: Cardiovascular;  Laterality: N/A;   CARDIAC CATHETERIZATION N/A 06/19/2014   Procedure: RIGHT/LEFT HEART  CATH AND CORONARY/GRAFT ANGIOGRAPHY;  Surgeon: Troy Sine, MD;  Location: [facility] CATH LAB;  Service: Cardiovascular;  Laterality: N/A;   CARDIAC CATHETERIZATION  2003   CARDIAC CATHETERIZATION N/A 08/28/2015   Procedure: Right Heart Cath and Coronary/Graft Angiography;  Surgeon: Larey Dresser, MD;  Location: [place] CV LAB;  Service: Cardiovascular;  Laterality: N/A;   CARDIOVERSION N/A 06/01/2014   Procedure: CARDIOVERSION;  Surgeon: Dorothy Spark, MD;  Location: [facility] ENDOSCOPY;  Service: Cardiovascular;  Laterality: N/A;   CARDIOVERSION  06/11/2015; 08/02/2015   CARDIOVERSION N/A 08/02/2015   Procedure: CARDIOVERSION;  Surgeon: Josue Hector, MD;  Location: [place];  Service: Cardiovascular;  Laterality: N/A;   CARDIOVERSION N/A 10/22/2015   Procedure: CARDIOVERSION;  Surgeon: Larey Dresser, MD;  Location: [place];  Service: Cardiovascular;  Laterality: N/A;   CARDIOVERSION N/A 03/15/2020   Procedure: CARDIOVERSION;  Surgeon: Larey Dresser, MD;  Location: [facility] ENDOSCOPY;  Service: Cardiovascular;  Laterality: N/A;   CARDIOVERSION N/A 05/14/2020   Procedure: CARDIOVERSION;  Surgeon: Pixie Casino, MD;  Location: [place];  Service: Cardiovascular;  Laterality: N/A;   CORONARY ARTERY BYPASS GRAFT  02/16/2002   CABG X5   LAPAROSCOPIC APPENDECTOMY N/A 07/20/2016   Procedure: APPENDECTOMY LAPAROSCOPIC;  Surgeon: Donnie Mesa, MD;  Location: [place];  Service: General;  Laterality: N/A;   TEE WITHOUT CARDIOVERSION N/A 06/01/2014   Procedure: TRANSESOPHAGEAL ECHOCARDIOGRAM (TEE);  Surgeon: Dorothy Spark, MD;  Location: [place];  Service: Cardiovascular;  Laterality: N/A;   TEE WITHOUT CARDIOVERSION N/A 08/03/2015   Procedure: TRANSESOPHAGEAL ECHOCARDIOGRAM (TEE);  Surgeon: Dorothy Spark, MD;  Location: [place];  Service: Cardiovascular;  Laterality: N/A;    Assessment / Plan / Recommendation Clinical Impression   Stanley Wells is a 75 year old male  with history of CAD, HTN, Chronic combined CHF, Afib/Aflutter who was admitted on 01/16/21 with left-sided weakness with sensory deficits that started while on a camping trip on 01/11/2021.  He had difficulty walking as well as tendency dropping things from his left hand he was seen in the ED 06/13 but left without evaluation.  He returned to the ED for work up on 01/16/21 .  UDS negative.  CT head done showing possible age indeterminate lacunar infarcts as well as advanced deep white matter microangiopathic changes and moderate parenchymal brain volume loss.  MRI brain done showing 1 cm acute infarct in posterior basal ganglia/posterior limb internal capsule on right as well as evidence of old microhemorrhages with hemosiderin deposits. CTA head/neck was negative for occlusion or high-grade stenosis. No tPA given as out of window for treatment and Dr. Erlinda Hong recommended addition of low-dose ASA for stroke prevention.  Symptoms improving and risk factor management recommended by neurology. 2D echo done today and pending.  Therapy evaluations completed on 06/16 revealing unsteady gait with balance deficits, tendency for LLE to buckle, kyphotic posture, poor awareness of deficits as well as reports of impaired problem-solving.  CIR recommended due to functional decline.    Pt presents with mild cogntive linguistic impairment characterized by decreased memory and problem solving and decreased awareness. Per previous slp evaluation wife reports pat has baseline memory deficits.  He was oriented x4 and able to complete mental calculations and  follow multi step commands accurately. He demonstrated adequate sustained attention for entire 50 min session. He required increased time and verbal cueing with recall tasks after a delay. Min A visual cues needed for higher level written problem solving task. Pt will benefit from skilled SLP intervention to increase independence prior to DC home.    Skilled Therapeutic  Interventions          SLP evaluation completed. Plan of care and goals reviewed with patient.     SLP Assessment  Patient will need skilled [place] Pathology Services during CIR admission    Recommendations  Patient destination: Home Follow up Recommendations: None Equipment Recommended: None recommended by SLP    SLP Frequency 3 to 5 out of 7 days   SLP Duration  SLP Intensity  SLP Treatment/Interventions 7-10 days  Minumum of 1-2 x/day, 30 to 90 minutes  Cognitive remediation/compensation;Therapeutic Activities    Pain Pain Assessment Pain Scale: Faces Pain Score: 0-No pain  Prior Functioning Cognitive/Linguistic Baseline: Within functional limits Type of Home: House  Lives With: Spouse Available Help at Discharge: Family Education: 2 years college Vocation: Retired  Programmer, systems Overall Cognitive Status: Impaired/Different from baseline Orientation Level: Oriented X4  Comprehension Auditory Comprehension Overall Auditory Comprehension: Appears within functional limits for tasks assessed Expression Expression Primary Mode of Expression: Verbal Verbal Expression Overall Verbal Expression: Appears within functional limits for tasks assessed     Care Tool Care Tool Cognition Expression of Ideas and Wants Expression of Ideas and Wants: Without difficulty (complex and basic) - expresses complex messages without difficulty and with speech that is clear and easy to understand   Understanding Verbal and Non-Verbal Content Understanding Verbal and Non-Verbal Content: Understands (complex  and basic) - clear comprehension without cues or repetitions   Memory/Recall Ability *first 3 days only          Short Term Goals: Week 1: SLP Short Term Goal 1 (Week 1): Pt will utilize external memory aids to recall new daily information with min A verbal cues. SLP Short Term Goal 2 (Week 1): Pt will demonstrate anticipatory awareness and identify 3  tasks he can participate in athome safely with supervision question cues. SLP Short Term Goal 3 (Week 1): Pt will complete functional complex problem solving tasks with Supervision A verbal cues.  Refer to Care Plan for Long Term Goals  Recommendations for other services: None   Discharge Criteria: Patient will be discharged from SLP if patient refuses treatment 3 consecutive times without medical reason, if treatment goals not met, if there is a change in medical status, if patient makes no progress towards goals or if patient is discharged from hospital.  The above assessment, treatment plan, treatment alternatives and goals were discussed and mutually agreed upon: by patient  [place] 01/21/2021, 9:44 AM

## 2021-01-21 NOTE — Progress Notes (Signed)
Occupational Therapy Session Note  Patient Details  Name: Stanley [name]: [id number] Date of Birth: 1945-08-08  Today's Date: 01/21/2021 OT Individual Time: 1583-0940 OT Individual Time Calculation (min): 73 min    Short Term Goals: Week 1:  OT Short Term Goal 1 (Week 1): STGs equal to LTGs set at modified independent overall.  Skilled Therapeutic Interventions/Progress Updates:    Pt received sitting EOB in room and consented to OT tx. Pt able to don shorts with setup sitting and standing EOB. Pt maintains balance with no device throughout. Pt walked to therapy gym with RW and close SUP with cues to remain inside the walker. Pt instructed in multiple tasks while standing to increase standing tolerance and balance for ADLs. Activities included [place] [facility] activity with tacks and cork board, pt instructed to use L hand for increased [facility] for ADLs. Pt able to complete task with time, then instructed in card matching game while standing and reaching with LUE and no device for UE support and SUP, maintaining good balance throughout task and tolerated standing well. Pt dropped a couple of cards on the ground, was able to bend over to pick up with CGA. Pt instructed in BUE strengthening HEP to increase strength and activity tolerance for ADLs and IADLs. Pt instructed in 3# db unilateral exercises including chest press, shoulder press, shoulder flexion and abduction, elbow flexion, and upright row all for 3x15 with min cuing for proper tech with good carryover. After tx, pt walked back to room with RW and CGA, left up in recliner with chair alarm on and all needs met.   Therapy Documentation Precautions:  Precautions Precautions: Fall Precaution Comments: Had fall on saturday when he first had symptoms Restrictions Weight Bearing Restrictions: No  Vital Signs: Therapy Vitals Temp: 97.8 F (36.6 C) Temp Source: Oral Pulse Rate: (!) 54 Resp: 19 BP: (!) 178/89 Patient Position (if appropriate):  Lying Oxygen Therapy SpO2: 97 % O2 Device: Room Air Pain: Pain Assessment Pain Scale: 0-10 Pain Score: 0-No pain    Therapy/Group: Individual Therapy  Durinda Buzzelli 01/21/2021, 8:01 AM

## 2021-01-22 LAB — GLUCOSE, CAPILLARY
Glucose-Capillary: 116 mg/dL — ABNORMAL HIGH (ref 70–99)
Glucose-Capillary: 103 mg/dL — ABNORMAL HIGH (ref 70–99)
Glucose-Capillary: 128 mg/dL — ABNORMAL HIGH (ref 70–99)
Glucose-Capillary: 112 mg/dL — ABNORMAL HIGH (ref 70–99)

## 2021-01-22 MED ORDER — METOPROLOL SUCCINATE ER 50 MG PO TB24
50.0000 mg | ORAL_TABLET | Freq: Two times a day (BID) | ORAL | Status: DC
  Administered 2021-01-22 – 2021-01-25 (×6): 50 mg via ORAL
  Filled 2021-01-22 (×6): qty 1

## 2021-01-22 NOTE — Progress Notes (Signed)
PROGRESS NOTE   Subjective/Complaints: No complaints this morning Eager to go home Doing well with therapy, planning for d/c Friday Prefers lifestyle modification to pharmalogical treatment for Type 2 DM  Objective:   No results found. Recent Labs    01/20/21 0020  WBC 9.7  HGB 14.4  HCT 43.0  PLT 197   Recent Labs    01/20/21 0020  NA 138  K 3.7  CL 100  CO2 29  GLUCOSE 120*  BUN 20  CREATININE 1.31*  CALCIUM 9.4    Intake/Output Summary (Last 24 hours) at 01/22/2021 1229 Last data filed at 01/22/2021 0700 Gross per 24 hour  Intake 780 ml  Output --  Net 780 ml        Physical Exam: Vital Signs Blood pressure (!) 159/91, pulse (!) 49, temperature 97.8 F (36.6 C), resp. rate 17, height 5\' 11"  (1.803 m), weight 101.3 kg, SpO2 98 %. Gen: no distress, normal appearing HEENT: oral mucosa pink and moist, NCAT Cardio: Bradycardic Chest: normal effort, normal rate of breathing Abd: soft, non-distended Ext: no edema Psych: pleasant, normal affect Skin: intact Neurologic: Cranial nerves II through XII intact, motor strength is 5/5 in RIght and 4/5 Leftdeltoid, bicep, tricep, grip, hip flexor, knee extensors, ankle dorsiflexor and plantar flexor Sensory exam normal sensation to light touch and proprioception in bilateral upper and lower extremities Cerebellar exam mild dysmetria Left FNF Musculoskeletal: Full range of motion in all 4 extremities. No joint swelling   Assessment/Plan: 1. Functional deficits which require 3+ hours per day of interdisciplinary therapy in a comprehensive inpatient rehab setting. Physiatrist is providing close team supervision and 24 hour management of active medical problems listed below. Physiatrist and rehab team continue to assess barriers to discharge/monitor patient progress toward functional and medical goals  Care Tool:  Bathing    Body parts bathed by patient: Right  arm, Left arm, Chest, Abdomen, Front perineal area, Buttocks, Right upper leg, Left upper leg, Right lower leg, Left lower leg, Face         Bathing assist Assist Level: Minimal Assistance - Patient > 75%     Upper Body Dressing/Undressing Upper body dressing   What is the patient wearing?: Pull over shirt    Upper body assist Assist Level: Set up assist    Lower Body Dressing/Undressing Lower body dressing      What is the patient wearing?: Underwear/pull up, Pants     Lower body assist Assist for lower body dressing: Minimal Assistance - Patient > 75% (Per OT eval)     Toileting Toileting    Toileting assist Assist for toileting: Contact Guard/Touching assist     Transfers Chair/bed transfer  Transfers assist     Chair/bed transfer assist level: Contact Guard/Touching assist     Locomotion Ambulation   Ambulation assist      Assist level: Contact Guard/Touching assist Assistive device: No Device Max distance: >322ft   Walk 10 feet activity   Assist     Assist level: Contact Guard/Touching assist Assistive device: No Device   Walk 50 feet activity   Assist    Assist level: Contact Guard/Touching assist Assistive device: No Device  Walk 150 feet activity   Assist    Assist level: Contact Guard/Touching assist Assistive device: No Device    Walk 10 feet on uneven surface  activity   Assist     Assist level: Contact Guard/Touching assist Assistive device: Other (comment) (handrail)   Wheelchair     Assist Will patient use wheelchair at discharge?: No             Wheelchair 50 feet with 2 turns activity    Assist            Wheelchair 150 feet activity     Assist          Blood pressure (!) 159/91, pulse (!) 49, temperature 97.8 F (36.6 C), resp. rate 17, height 5\' 11"  (1.803 m), weight 101.3 kg, SpO2 98 %.  Medical Problem List and Plan: 1.  Decreased self-care and mobility secondary to  right internal capsule infarct             -patient may  shower             -ELOS/Goals: 5 to 7 days Continue CIR evals PT< OT -Interdisciplinary Team Conference today   2.  Antithrombotics: -DVT/anticoagulation:  Pharmaceutical: Other (comment)--Eliquis             -antiplatelet therapy: Low dose ASA added 06/16 3. Pain Management: N/A 4. Mood: LCSW to follow for evaluation and support.              -antipsychotic agents: N/A 5. Neuropsych: This patient is capable of making decisions on his own behalf. 6. Skin/Wound Care:  Routine pressure relief measures.  7. Fluids/Electrolytes/Nutrition: Monitor I/O.drinking well d/c IV 8. H/o A fib/Aflutter: Monitor HR TID.  Vitals:   01/21/21 2011 01/22/21 0521  BP:  (!) 159/91  Pulse: 67 (!) 49  Resp:  17  Temp:  97.8 F (36.6 C)  SpO2:  98%  Permissive HTN keep pressures < 200/110 until ~1wk post CVA (01/23/21), may need to adjust meds then 9. CAD s/p CABG 2003: Continue Toprol XL, Crestor, Entesto, Imdur and ASA --Monitor for symptoms with increase in activity 10. CM /Chronic systolic CHF: EF 2004. Will monitor for signs of overload and check weights daily.             --heart healthy diet.             --continue Entresto, Imdur, Metoprolol and Crestor.   Weights stable Filed Weights   01/20/21 0414 01/21/21 0512 01/22/21 0500  Weight: 101.5 kg 100.8 kg 101.3 kg    11. T2DM: Hgb A1C-6.7 - now diabetic-->Add CM restrictions. Provide dietary education. Defers medications, May stop CBG checks.  CBG (last 3)  Recent Labs    01/21/21 2100 01/22/21 0602 01/22/21 1116  GLUCAP 156* 116* 103*    12.OSA: Refusing CPAP, discuss benefits of using CPAP.  13. Bradycardia: continue to monitor HR TID. Decrease Toprol XR to 50mg  BID.    LOS: 3 days A FACE TO FACE EVALUATION WAS PERFORMED  01/24/21 Moesha Sarchet 01/22/2021, 12:29 PM

## 2021-01-22 NOTE — Patient Care Conference (Signed)
Inpatient RehabilitationTeam Conference and Plan of Care Update Date: 01/22/2021   Time: 1:32 PM    Patient Name: Stanley Wells      Medical Record Number: [id number]  Date of Birth: 07-04-1946 Sex: Male         Room/Bed: 4M03C/4M03C-01 Payor Info: Payor: MEDICARE / Plan: MEDICARE PART A AND B / Product Type: *No Product type* /    Admit Date/Time:  01/19/2021  4:03 PM  Primary Diagnosis:  Subcortical infarction [facility])  Hospital Problems: Principal Problem:   Subcortical infarction [facility]) Active Problems:   Embolic stroke of right basal ganglia [facility])    Expected Discharge Date: Expected Discharge Date: 01/25/21  Team Members Present: Physician leading conference: Dr. Sula Soda Care Coodinator Present: Dossie Der, LCSW;Ysela Hettinger Marlyne Beards, RN, BSN, CRRN Nurse Present: Kennyth Arnold, RN PT Present: Raechel Chute, PT OT Present: Other (comment) Ocie Cornfield, OT) PPS Coordinator present : Edson Snowball, PT     Current Status/Progress Goal Weekly Team Focus  Bowel/Bladder   continent of bowel and bladder LBM-01/22/21  Maintain regular pattern of bowel and bladder  Assess B/B every shift and prn, assist with toileting needs   Swallow/Nutrition/ Hydration             ADL's   CGA-SUP with all ADLs  mod I  ADL retraining, balance, LUE coordination, strength and activity tolerance   Mobility   bed mobility supervision, transfers without AD CGA, gait 133ft without AD CGA, 12 steps 2 rails CGA  supervision overall  functional mobility/transfers, generalized strengthening, dynamic standing balance/coordination, gait training, NMR, D/C planning, and improved endurance with activity.   Communication             Safety/Cognition/ Behavioral Observations  min A  supervision  error awareness, problem solving with functional tasks, use of memory strateiges.   Pain   Denies pain  Remain pain free  Assess pain every shift and prn   Skin   No skin issues at present.  Remain free of  skin issues.  Assess skin every shift and prn     Discharge Planning:  Home with wife who can assist with his care if needed. Pt doing well in therapies and progressing   Team Discussion: Uncontrolled BP, uncontrolled CBG's, new Diabetic, AKI, encourage 6-8 cups of water per day. Continent B/B, no pain reported, educating on diabetes management. Patient on target to meet rehab goals: yes, moving very well. Supervision bed mobility, contact guard for transfers, has supervision goals. Set-up to supervision for ADL's.   *See Care Plan and progress notes for long and short-term goals.   Revisions to Treatment Plan:  Will start BP medications tomorrow.  Teaching Needs: Family education, Medication management, diabetes education, transfer training, gait training, balance training, endurance training, stair training, safety awareness.  Current Barriers to Discharge: Decreased caregiver support, Medical stability, Home enviroment access/layout, New diabetic, Lack of/limited family support, Weight, Medication compliance, Behavior, and Nutritional means  Possible Resolutions to Barriers: Continue current medications, provide emotional support.     Medical Summary Current Status: uncontrolled CBGs secondary to newly diagnosed type 2 DM, uncontrolled HTN  Barriers to Discharge: Medical stability  Barriers to Discharge Comments: uncontrolled CBGs secondary to newly diagnosed type 2 DM, uncontrolled HTN Possible Resolutions to [employer] Focus: continue dietary education, may d/c CBG checks, start BP medications tomorrow once out of permissive HTN window   Continued Need for Acute Rehabilitation Level of Care: The patient requires daily medical management by a physician with specialized training  in physical medicine and rehabilitation for the following reasons: Direction of a multidisciplinary physical rehabilitation program to maximize functional independence : Yes Medical management of  patient stability for increased activity during participation in an intensive rehabilitation regime.: Yes Analysis of laboratory values and/or radiology reports with any subsequent need for medication adjustment and/or medical intervention. : Yes   I attest that I was present, lead the team conference, and concur with the assessment and plan of the team.   Tennis Must 01/22/2021, 2:41 PM

## 2021-01-22 NOTE — Progress Notes (Signed)
Speech Language Pathology Daily Session Note  Patient Details  Name: Stanley [name]: [id number] Date of Birth: May 07, 1946  Today's Date: 01/22/2021 SLP Individual Time: 0815-0900 SLP Individual Time Calculation (min): 45 min  Short Term Goals: Week 1: SLP Short Term Goal 1 (Week 1): Pt will utilize external memory aids to recall new daily information with min A verbal cues. SLP Short Term Goal 2 (Week 1): Pt will demonstrate anticipatory awareness and identify 3 tasks he can participate in athome safely with supervision question cues. SLP Short Term Goal 3 (Week 1): Pt will complete functional complex problem solving tasks with Supervision A verbal cues.  Skilled Therapeutic Interventions: Skilled SLP intervention focused on cognition. Pt able to recall 3 medications he is taking for blood pressure and cholesterol. Medication list complete for pt to have when needed. He completed BID pill organization task with min A verbal cues. He wrote events in calendar during calendar organization task with min A verbal cues for attention to details. PT left seated upright in bed with bed alarm set and call button within reach . Cont with therapy per plan of care.      Pain Pain Assessment Pain Scale: Faces Faces Pain Scale: No hurt  Therapy/Group: Individual Therapy  Carlean Jews Baila Rouse 01/22/2021, 8:58 AM

## 2021-01-22 NOTE — Progress Notes (Signed)
Patient resting well so far this shift. Denies pain. Continent of bowel and bladder. No problems noted so far this shift.

## 2021-01-22 NOTE — IPOC Note (Signed)
Overall Plan of Care [facility]) Patient Details Name: Stanley [name]: [id number] DOB: 06-09-1946  Admitting Diagnosis: Subcortical infarction [facility])  Hospital Problems: Principal Problem:   Subcortical infarction [facility]) Active Problems:   Embolic stroke of right basal ganglia (HCC)     Functional Problem List: Nursing Safety  PT Balance, Endurance, Motor, Safety  OT Balance, Cognition, Motor, Safety  SLP Cognition  TR         Basic ADL's: OT Grooming, Bathing, Dressing, Toileting     Advanced  ADL's: OT Simple Meal Preparation, Light Housekeeping     Transfers: PT Bed Mobility, Bed to Chair, Car, Furniture, Floor  OT Toilet, Tub/Shower     Locomotion: PT Ambulation, Psychologist, prison and probation services, Stairs     Additional Impairments: OT Fuctional Use of Upper Extremity  SLP        TR      Anticipated Outcomes Item Anticipated Outcome  Self Feeding Independent  Swallowing      Basic self-care  Modified independent  Toileting  modified independent   Bathroom Transfers modified independent  Bowel/Bladder  Manage bowel and bladder with mod I assist  Transfers  Supervision  Locomotion  Supervision with LRAD  Communication     Cognition  mod I  Pain  Remain free of pain  Safety/Judgment  remain free of injury, prevent falls with mod I assist   Therapy Plan: PT Intensity: Minimum of 1-2 x/day ,45 to 90 minutes PT Frequency: 5 out of 7 days PT Duration Estimated Length of Stay: 5-7 days OT Intensity: Minimum of 1-2 x/day, 45 to 90 minutes OT Frequency: 5 out of 7 days OT Duration/Estimated Length of Stay: 7-9 days SLP Intensity: Minumum of 1-2 x/day, 30 to 90 minutes SLP Frequency: 3 to 5 out of 7 days SLP Duration/Estimated Length of Stay: 7-10 days   Due to the current state of emergency, patients may not be receiving their 3-hours of Medicare-mandated therapy.   Team Interventions: Nursing Interventions Patient/Family Education, Discharge Planning,  Psychosocial Support, Disease Management/Prevention, Medication Management  PT interventions Ambulation/gait training, Warden/ranger, Cognitive remediation/compensation, Community reintegration, Discharge planning, Disease management/prevention, DME/adaptive equipment instruction, Functional mobility training, Neuromuscular re-education, Pain management, Patient/family education, Stair training, Therapeutic Activities, Therapeutic Exercise, UE/LE Strength taining/ROM, UE/LE Coordination activities  OT Interventions Balance/vestibular training, Discharge planning, Self Care/advanced ADL retraining, Pain management, Therapeutic Activities, UE/LE Coordination activities, Patient/family education, Functional mobility training, Cognitive remediation/compensation, Community reintegration, Fish farm manager, Neuromuscular re-education, UE/LE Strength taining/ROM  SLP Interventions Cognitive remediation/compensation, Therapeutic Activities  TR Interventions    SW/CM Interventions Discharge Planning, Psychosocial Support, Patient/Family Education   Barriers to Discharge MD  Medical stability  Nursing Decreased caregiver support, Home environment access/layout, Lack of/limited family support, Medication compliance, Behavior    PT      OT      SLP      SW       Team Discharge Planning: Destination: PT-Home ,OT- Home , SLP-Home Projected Follow-up: PT-Outpatient PT, OT-  Outpatient OT, SLP-None Projected Equipment Needs: PT-None recommended by PT, OT- To be determined, SLP-None recommended by SLP Equipment Details: PT- , OT-  Patient/family involved in discharge planning: PT- Patient,  OT-Patient, SLP-   MD ELOS: 5-7 days Medical Rehab Prognosis:  Excellent Assessment: Stanley Wells is a 75 year old man who is admitted to CIR with decreased self-care and mobility secondary to right internal capsule infarct.  Active medical issues include bradycardia, OSA, newly  diagnosed type 2 DM, chronic systolic CHF, and CAD. Medications  are being managed, vitals and labs monitored regularly.    See Team Conference Notes for weekly updates to the plan of care

## 2021-01-22 NOTE — Progress Notes (Signed)
Occupational Therapy Session Note  Patient Details  Name: ERASMUS BISTLINE MRN: [id number] Date of Birth: [birth year]  Today's Date: 01/22/2021 OT Individual Time: 1359-1458 OT Individual Time Calculation (min): 59 min    Short Term Goals: Week 1:  OT Short Term Goal 1 (Week 1): STGs equal to LTGs set at modified independent overall.  Skilled Therapeutic Interventions/Progress Updates:    Pt received in room and consented to OT tx.  Pt seen for ADL routine including toileting, bathing, dressing, and functional trasnfers and mobility training. Pt req CGA for all functional transfers with RW and cuing for safety with RW with fair carryover. Pt able to bathe with SUP and CGA when standing for safety, able to walk with RW from bathroom to EOB with CGA to complete dressing with setup. Pt required instruction for PLB techs during activity as he is visibly fatigued. Remainder of session utilized to instruct pt in BUE strengthening HEP to increase strength and activity tolerance for ADLs. Pt instructed in 3# db unilateral exercises including elbow flexion, shoulder press, chest press, shoulder flexion, tricep extension, shoulder lateral raise, and upright row all for 3x15 with min cuing for proper technique with good carryover. After tx, pt left up in recliner with alarm on and all needs met.  Therapy Documentation Precautions:  Precautions Precautions: Fall Precaution Comments: Had fall on saturday when he first had symptoms Restrictions Weight Bearing Restrictions: No  Pain: Pain Assessment Pain Scale: 0-10 Pain Score: 0-No pain Faces Pain Scale: No hurt   Therapy/Group: Individual Therapy  Aquilla Shambley 01/22/2021, 1:17 PM

## 2021-01-22 NOTE — Progress Notes (Signed)
Physical Therapy Session Note  Patient Details  Name: Stanley [name]: [id number] Date of Birth: 1946-01-21  Today's Date: 01/22/2021 PT Individual Time: 0321-2248 and 1300-1325 PT Individual Time Calculation (min): 54 min and 25 min  Short Term Goals: Week 1:  PT Short Term Goal 1 (Week 1): =LTG due to ELOS  Skilled Therapeutic Interventions/Progress Updates:   Treatment Session 1 Received pt supine in bed finishing SLP session, pt agreeable to PT treatment, and denied any pain during session. Session with emphasis on functional mobility/transfers, generalized strengthening, dynamic standing balance/coordination, gait training, NMR, and improved activity tolerance. Pt transferred supine<>sitting EOB with supervision and donned shoes sitting EOB with supervision. Sit<>stand without AD and CGA and ambulated >343ft x2 trials without AD and CGA to/from dayroom. Upon approaching mat pt with L lateral LOB when turning requiring min A to correct. Pt demonstrated narrow BOS, shuffling gait pattern, decreased bilateral foot clearance L>R, downward gaze, and decreased bilateral arm swing requiring cues to increase step length. Pt performed standing alternating toe taps to 3in step without UE support and min A for balance 2x20 with cues to decrease speed to emphasize eccentric control. Worked on dynamic standing balance copying pictures with legos (moderate/high complexity) x 2 trials with emphasis on functional use of LUE, problem solving, and spatial awareness. Trial 1 with 2 errors and Trial 2 with 0 errors and no LOB noted. Pt then performed squats 1x10 unweighted and 2x10 with 6lb medicine ball with CGA for balance with emphasis on L lateral weight shifting and L quad strengthening. Pt performed the following exercises standing without UE support with emphasis on dynamic balance: -alternating marches with min A x10 bilaterally -alternating hip abduction with min A x10 bilaterally (difficulty with  coordination on LLE; compensating using hip flexor) -heel raises x12 Concluded session with pt sitting in recliner, needs within reach, and chair pad alarm.   Treatment Session 2 Received pt sitting in recliner with family present at bedside. Pt agreeable to PT treatment and denied any pain during session. Session with emphasis on functional mobility/transfers, generalized strengthening, dynamic standing balance/coordination, gait training, and improved activity tolerance. Pt ambulated 9ft x 2 trials without AD and CGA to/from ortho gym. Pt required cues to increase step/stride length on L as pt with multiple instance of L toe catching. Pt performed BUE/LE strengthening on Nustep at workload 7 for 8 minutes for a total of 540 steps for improved cardiovascular endurance and reciprocal  movement training. Pt required extensive rest break afterwards due to SOB/fatigue. Pt then ambulated 69ft on uneven surfaces (ramp and mulch) and descended 1 6in curb without AD and CGA with 1 instance of LOB requiring light min A to correct. Pt requested to return to bed at end of session and doffed shoes and transferred sit<>supine with supervision. Concluded session with pt supine in bed, needs within reach, and bed alarm on.   Therapy Documentation Precautions:  Precautions Precautions: Fall Precaution Comments: Had fall on saturday when he first had symptoms Restrictions Weight Bearing Restrictions: No  Therapy/Group: Individual Therapy Martin Majestic PT, DPT   01/22/2021, 7:16 AM

## 2021-01-22 NOTE — Progress Notes (Signed)
Patient ID: Stanley Wells, male   DOB: June 03, 1946, 75 y.o.   MRN: [id number]  Met with pt to discuss team conference goals supervision-mod/I level and discharge 6/24. He is pleased with his progress and has a cane he can borrow form his son. So no equipment needs. Discussed OPPT will look for facility close to pt's home. Continue to work on discharge needs.

## 2021-01-23 LAB — GLUCOSE, CAPILLARY: Glucose-Capillary: 121 mg/dL — ABNORMAL HIGH (ref 70–99)

## 2021-01-23 NOTE — Progress Notes (Signed)
Occupational Therapy Session Note  Patient Details  Name: Stanley [name]: [id number] Date of Birth: [birth year]  Today's Date: 01/23/2021 OT Individual Time: 1000-1026 OT Individual Time Calculation (min): 26 min    Short Term Goals: Week 1:  OT Short Term Goal 1 (Week 1): STGs equal to LTGs set at modified independent overall.    Skilled Therapeutic Interventions/Progress Updates:    Pt greeted at time of session sitting up in recliner agreeable to OT session, already performed ADL this am. No pain. Ambulated room > egress doors > big gym with CGA/close supervision no AD and no LOB. Performed 2x15-20 reps of the following at rebounder with weighted blue ball: toss, toss +overhead press, toss + torso twist with pt counting and recalling order of exercises. Seated rest breaks in between sets, HR fluctuating between 45-65 and O2 sats 97% or higher. Squats EOM 1x20 with cues for form and foot placement. Ambulated back to room same as above, alarm on call bell in reach.   Therapy Documentation Precautions:  Precautions Precautions: Fall Precaution Comments: Had fall on saturday when he first had symptoms Restrictions Weight Bearing Restrictions: No     Therapy/Group: Individual Therapy  Erasmo Score 01/23/2021, 7:25 AM

## 2021-01-23 NOTE — Progress Notes (Signed)
Physical Therapy Session Note  Patient Details  Name: Stanley [name]: [id number] Date of Birth: 1946/05/06  Today's Date: 01/23/2021 PT Individual Time: 1100-1155 and 1500-1526 PT Individual Time Calculation (min): 55 min and 26 min  Short Term Goals: Week 1:  PT Short Term Goal 1 (Week 1): =LTG due to ELOS  Skilled Therapeutic Interventions/Progress Updates:   Treatment Session 1 Received pt sitting in recliner with family present at bedside, pt agreeable to PT treatment, and denied any pain during session. Session with emphasis on functional mobility/transfers, toileting, generalized strengthening, dynamic standing balance/coordination, gait training, NMR, and improved activity tolerance. Pt reported urge to use restroom and ambulated 52ft without AD and CGA to bathroom and able to perform clothing management and void with supervision. Pt stood at sink and washed hands with close supervision then ambulated 36ft without AD and CGA to therapy gym. Provided pt with SPC and pt ambulated additional >14ft with SPC and CGA to dayroom with cues for AD management and technique as well as cues to increase LLE step length. Worked on sit<>stands on Aire[employer] without UE support and min A for balance 2x20 reps with emphasis on quad strengthening and equal weight distribution. Then worked on dynamic standing balance performing alternating toe taps to colored cones 3x10 reps based off therapist's cues with min assist. Transitioned to dynamic standing balance dribbling ball with wide BOS x 1 and narrow BOS x 2 (1 trial challenging pt to state alphabet) with min A for balance for 1 minute each trial with generalized unsteadiness but no true LOB. Pt required multiple rest breaks throughout session due to fatigue and SOB. Pt ambulated >356ft with SPC and CGA back to room. Discussed with pt/family recommendation for [facility] upon D/C. Pt reports his son may have one and planning to follow up with him. Concluded session with  pt sitting in recliner, needs within reach, and chair pad alarm on.   Treatment Session 2 Received pt sitting in recliner, pt agreeable to PT treatment, and denied any pain during session. Session with emphasis on functional mobility/transfers, generalized strengthening, dynamic standing balance/coordination, ambulation, stair navigation, and improved activity tolerance. Pt reported urge to urinate and ambulated 19ft to bathroom without AD and CGA. Pt able to manage clothing and perform hygiene with supervision. Pt stood at sink and washed hands with supervision. Pt ambulated 175ft x 2 trials with SPC and CGA to/from therapy gym with cues to increase LLE step length and for upright posture. Pt navigated 8 steps with L handrail and CGA using a lateral stepping technique to simulate home entry. Pt performed TUG x 2 trials without AD and x 1 trial with SPC with average of 18.6 seconds. Educated pt on test results and significance indicating high fall risk as well as recommendation to use SPC upon discharge. Pt verbalized understanding of therapist recommendations but ultimately stated that he is going to do what he wants at home. Doffed shoes and transferred sit<>supine with supervision. Concluded session with pt supine in bed, needs within reach, and bed alarm on.  Therapy Documentation Precautions:  Precautions Precautions: Fall Precaution Comments: Had fall on saturday when he first had symptoms Restrictions Weight Bearing Restrictions: No  Therapy/Group: Individual Therapy Martin Majestic PT, DPT   01/23/2021, 7:26 AM

## 2021-01-23 NOTE — Progress Notes (Signed)
Occupational Therapy Session Note  Patient Details  Name: Stanley [name]: [id number] Date of Birth: November 02, 1945  Today's Date: 01/23/2021 OT Individual Time: 7062-3762 OT Individual Time Calculation (min): 56 min    Short Term Goals: Week 1:  OT Short Term Goal 1 (Week 1): STGs equal to LTGs set at modified independent overall.  Skilled Therapeutic Interventions/Progress Updates:    Pt received in room sitting EOB and consented to OT tx. Pt seen for instruction and training in shower transfers, simple meal prep activities, and standing and [place] tasks to increase overall ADL and IADL independence and safety. Pt instructed in walk in shower transfer simulating the one he will be transferring to at home. Pt required CGA for steadying assist when getting into simulated shower with no AD, grab bars, or BUE support. Pt then taken to ADL kitchen and instructed in item retrieval and transport to simulate simple meal prep. Pt able to complete with close SUP with counter top support when reaching outside of BOS to reach for items. Afterwards, pt walked to therapy gym with no AD and CGA and instructed in standing tolerance tasks with [facility] activities including small tack and cork board activity with L hand only, and piecing a 24 piece jumbo jigsaw puzzle together for cognitive task with BUE manipulation and crossing midline while standing at table. Pt demo's good activity tolerance throughout session, remains a little unsteady when walking continuing to require CGA with no device as pt's legs ""feel tired" and "not awake yet" this morning. Pt issued 9 hole peg test, scored 29 seconds for R hand, 36 seconds for L hand. Remaining time utilized to instruct in BUE strengthening HEP with 4# dowel rod. Pt instructed in chest press, shoulder press, and shoulder flexionf or 3x15 with min cuing for proper technique with good carryover. After tx, pt walked back to room with no AD and CGA, left sitting up in recliner with  alarm on and all needs met, MD present.  Therapy Documentation Precautions:  Precautions Precautions: Fall Precaution Comments: Had fall on saturday when he first had symptoms Restrictions Weight Bearing Restrictions: No  Vital Signs: Therapy Vitals Temp: 98 F (36.7 C) Temp Source: Oral Pulse Rate: (!) 52 Resp: 19 BP: 139/84 Patient Position (if appropriate): Lying Oxygen Therapy SpO2: 100 % O2 Device: Room Air Pain: none      Therapy/Group: Individual Therapy  Tuyet Bader 01/23/2021, 7:33 AM

## 2021-01-23 NOTE — Progress Notes (Signed)
Speech Language Pathology Daily Session Note  Patient Details  Name: LINDELL RENFREW MRN: [id number] Date of Birth: [birth year]  Today's Date: 01/23/2021 SLP Individual Time: 1400-1430 SLP Individual Time Calculation (min): 30 min  Short Term Goals: Week 1: SLP Short Term Goal 1 (Week 1): Pt will utilize external memory aids to recall new daily information with min A verbal cues. SLP Short Term Goal 2 (Week 1): Pt will demonstrate anticipatory awareness and identify 3 tasks he can participate in athome safely with supervision question cues. SLP Short Term Goal 3 (Week 1): Pt will complete functional complex problem solving tasks with Supervision A verbal cues.  Skilled Therapeutic Interventions:   Patient seen for skilled ST session focusing on cognitive goals. Patient completed Daily Math Problems subtest from [facility] and had a score of 90% but given extended time to complete. He demonstrated good awareness to deficits overall, good recall and anticipatory awareness. (Planned discharge 6/24). He will continue to benefit from skilled SLP intervention to maximize cognitive function prior to discharge.  Pain Pain Assessment Pain Scale: 0-10 Faces Pain Scale: No hurt  Therapy/Group: Individual Therapy  Angela Nevin, MA, CCC-SLP Speech Therapy

## 2021-01-23 NOTE — Discharge Instructions (Addendum)
Inpatient Rehab Discharge Instructions  [facility] Discharge date and time: 01/25/21   Activities/Precautions/ Functional Status: Activity: no lifting, driving, or strenuous exercise TILL CLEARED BY MD.  Diet: cardiac diet and diabetic diet Wound Care: none needed Functional status:  ___ No restrictions     ___ Walk up steps independently _X__ 24/7 supervision/assistance   ___ Walk up steps with assistance ___ Intermittent supervision/assistance  ___ Bathe/dress independently ___ Walk with walker     ___ Bathe/dress with assistance ___ Walk Independently    ___ Shower independently ___ Walk with assistance    _X__ Shower with assistance _x__ No alcohol     ___ Return to work/school ________  Special Instructions:   STROKE/TIA DISCHARGE INSTRUCTIONS SMOKING Cigarette smoking nearly doubles your risk of having a stroke & is the single most alterable risk factor  If you smoke or have smoked in the last 12 months, you are advised to quit smoking for your health. Most of the excess cardiovascular risk related to smoking disappears within a year of stopping. Ask you doctor about anti-smoking medications [place] Quit Line: 1-800-QUIT NOW Free Smoking Cessation Classes [phone]  CHOLESTEROL Know your levels; limit fat & cholesterol in your diet  Lipid Panel     Component Value Date/Time   CHOL 107 01/17/2021 0344   TRIG 167 (H) 01/17/2021 0344   HDL 33 (L) 01/17/2021 0344   CHOLHDL 3.2 01/17/2021 0344   VLDL 33 01/17/2021 0344   LDLCALC 41 01/17/2021 0344     Many patients benefit from treatment even if their cholesterol is at goal. Goal: Total Cholesterol (CHOL) less than 160 Goal:  Triglycerides (TRIG) less than 150 Goal:  HDL greater than 40 Goal:  LDL (LDLCALC) less than 100   BLOOD PRESSURE American Stroke Association blood pressure target is less that 120/80 mm/Hg  Your discharge blood pressure is:  BP: 130/80 Monitor your blood pressure Limit your salt  and alcohol intake Many individuals will require more than one medication for high blood pressure  DIABETES (A1c is a blood sugar average for last 3 months) Goal HGBA1c is under 7% (HBGA1c is blood sugar average for last 3 months)  Diabetes:     Lab Results  Component Value Date   HGBA1C 6.7 (H) 01/17/2021    Your HGBA1c can be lowered with medications, healthy diet, and exercise. Check your blood sugar as directed by your physician Call your physician if you experience unexplained or low blood sugars.  PHYSICAL ACTIVITY/REHABILITATION Goal is 30 minutes at least 4 days per week  Activity: No driving, Therapies: SEE BELOWReturn to work: n/A Activity decreases your risk of heart attack and stroke and makes your heart stronger.  It helps control your weight and blood pressure; helps you relax and can improve your mood. Participate in a regular exercise program. Talk with your doctor about the best form of exercise for you (dancing, walking, swimming, cycling).  DIET/WEIGHT Goal is to maintain a healthy weight  Your discharge diet is:  Diet Order             Diet heart healthy/carb modified Room service appropriate? Yes; Fluid consistency: Thin  Diet effective now                   liquids Your height is:  Height: 5\' 11"  (180.3 cm) Your current weight is: Weight: 101.6 kg Your Body Mass Index (BMI) is:  BMI (Calculated): 31.25 Following the type of diet  specifically designed for you will help prevent another stroke. Your goal weight IS Your goal Body Mass Index (BMI) is 19-24. Healthy food habits can help reduce 3 risk factors for stroke:  High cholesterol, hypertension, and excess weight.  RESOURCES Stroke/Support Group:  Call [phone]   STROKE EDUCATION PROVIDED/REVIEWED AND GIVEN TO PATIENT Stroke warning signs and symptoms How to activate emergency medical system (call 911). Medications prescribed at discharge. Need for follow-up after discharge. Personal risk factors  for stroke. Pneumonia vaccine given:  Flu vaccine given:  My questions have been answered, the writing is legible, and I understand these instructions.  I will adhere to these goals & educational materials that have been provided to me after my discharge from the hospital.       My questions have been answered and I understand these instructions. I will adhere to these goals and the provided educational materials after my discharge from the hospital.  Patient/Caregiver Signature _______________________________ Date __________  Clinician Signature _______________________________________ Date __________  Please bring this form and your medication list with you to all your follow-up doctor's appointments.      COMMUNITY REFERRALS UPON DISCHARGE:    Outpatient: PT             Agency:[facility] [address] [place] [address] Phone:[phone]              Appointment Date/Time: WILL CALL WIFE TO ARRANGE APPOINTMENTS  Medical Equipment/Items Ordered:WILL BORROW CANE FROM SON                                                 Agency/Supplier:NA

## 2021-01-23 NOTE — Progress Notes (Signed)
PROGRESS NOTE   Subjective/Complaints: No complaints this morning Tolerating therapy very well. Content with d/c date Says his wife wanted to be here when I visited, asked him to let nursing know to contact me when she gets here and I will come by  Objective:   No results found. No results for input(s): WBC, HGB, HCT, PLT in the last 72 hours.  No results for input(s): NA, K, CL, CO2, GLUCOSE, BUN, CREATININE, CALCIUM in the last 72 hours.   Intake/Output Summary (Last 24 hours) at 01/23/2021 1225 Last data filed at 01/23/2021 1410 Gross per 24 hour  Intake 1250 ml  Output --  Net 1250 ml        Physical Exam: Vital Signs Blood pressure 139/84, pulse (!) 52, temperature 98 F (36.7 C), temperature source Oral, resp. rate 19, height 5\' 11"  (1.803 m), weight 100.6 kg, SpO2 100 %. Gen: no distress, normal appearing HEENT: oral mucosa pink and moist, NCAT Cardio: Bradycardia Chest: normal effort, normal rate of breathing Abd: soft, non-distended Ext: no edema Psych: pleasant, normal affect Neurologic: Cranial nerves II through XII intact, motor strength is 5/5 in RIght and 4/5 Leftdeltoid, bicep, tricep, grip, hip flexor, knee extensors, ankle dorsiflexor and plantar flexor Sensory exam normal sensation to light touch and proprioception in bilateral upper and lower extremities Cerebellar exam mild dysmetria Left FNF Musculoskeletal: Full range of motion in all 4 extremities. No joint swelling   Assessment/Plan: 1. Functional deficits which require 3+ hours per day of interdisciplinary therapy in a comprehensive inpatient rehab setting. Physiatrist is providing close team supervision and 24 hour management of active medical problems listed below. Physiatrist and rehab team continue to assess barriers to discharge/monitor patient progress toward functional and medical goals  Care Tool:  Bathing    Body parts bathed  by patient: Right arm, Left arm, Chest, Abdomen, Front perineal area, Buttocks, Right upper leg, Left upper leg, Right lower leg, Left lower leg, Face         Bathing assist Assist Level: Contact Guard/Touching assist     Upper Body Dressing/Undressing Upper body dressing   What is the patient wearing?: Pull over shirt    Upper body assist Assist Level: Set up assist    Lower Body Dressing/Undressing Lower body dressing      What is the patient wearing?: Underwear/pull up, Pants     Lower body assist Assist for lower body dressing: Set up assist     Toileting Toileting    Toileting assist Assist for toileting: Supervision/Verbal cueing     Transfers Chair/bed transfer  Transfers assist     Chair/bed transfer assist level: Contact Guard/Touching assist     Locomotion Ambulation   Ambulation assist      Assist level: Contact Guard/Touching assist Assistive device: Cane-straight Max distance: >360ft   Walk 10 feet activity   Assist     Assist level: Contact Guard/Touching assist Assistive device: Cane-straight   Walk 50 feet activity   Assist    Assist level: Contact Guard/Touching assist Assistive device: Cane-straight    Walk 150 feet activity   Assist    Assist level: Contact Guard/Touching assist Assistive device: Cane-straight  Walk 10 feet on uneven surface  activity   Assist     Assist level: Contact Guard/Touching assist Assistive device: Other (comment) (handrail)   Wheelchair     Assist Will patient use wheelchair at discharge?: No             Wheelchair 50 feet with 2 turns activity    Assist            Wheelchair 150 feet activity     Assist          Blood pressure 139/84, pulse (!) 52, temperature 98 F (36.7 C), temperature source Oral, resp. rate 19, height 5\' 11"  (1.803 m), weight 100.6 kg, SpO2 100 %.  Medical Problem List and Plan: 1.  Decreased self-care and mobility  secondary to right internal capsule infarct             -patient may  shower             -ELOS/Goals: 5 to 7 days Continue CIR evals PT< OT 2.  Antithrombotics: -DVT/anticoagulation:  Pharmaceutical: Other (comment)--Eliquis             -antiplatelet therapy: Low dose ASA added 06/16 3. Pain Management: N/A 4. Mood: LCSW to follow for evaluation and support.              -antipsychotic agents: N/A 5. Neuropsych: This patient is capable of making decisions on his own behalf. 6. Skin/Wound Care:  Routine pressure relief measures.  7. Fluids/Electrolytes/Nutrition: Monitor I/O.drinking well d/c IV 8. H/o A fib/Aflutter: Monitor HR TID.  Vitals:   01/22/21 2008 01/23/21 0359  BP: (!) 147/94 139/84  Pulse: 68 (!) 52  Resp: 17 19  Temp: (!) 97.5 F (36.4 C) 98 F (36.7 C)  SpO2: 97% 100%  Permissive HTN keep pressures < 200/110 until ~1wk post CVA (01/23/21), may need to adjust meds then 9. CAD s/p CABG 2003: Continue Toprol XL, Crestor, Entesto, Imdur and ASA --Monitor for symptoms with increase in activity 10. CM /Chronic systolic CHF: EF 2004. Will monitor for signs of overload and check weights daily.             --heart healthy diet.             --continue Entresto, Imdur, Metoprolol and Crestor.   Weights stable Filed Weights   01/21/21 0512 01/22/21 0500 01/23/21 0500  Weight: 100.8 kg 101.3 kg 100.6 kg    11. T2DM: Hgb A1C-6.7 - now diabetic-->Add CM restrictions. Provided dietary education. Defers medications, May stop CBG checks, discussed with patient.  CBG (last 3)  Recent Labs    01/22/21 1613 01/22/21 2134 01/23/21 0603  GLUCAP 128* 112* 121*   12.OSA: Refusing CPAP, discuss benefits of using CPAP. Does not feel sleepy during the day.  13. Bradycardia: continue to monitor HR TID. Decrease Toprol XR to 50mg  BID. Continue to monitor for effects of yesterday's change    LOS: 4 days A FACE TO FACE EVALUATION WAS PERFORMED  01/25/21 Courteny Egler 01/23/2021, 12:25  PM

## 2021-01-24 MED ORDER — ACETAMINOPHEN 325 MG PO TABS: 325.0000 mg | ORAL_TABLET | ORAL | Status: DC | PRN

## 2021-01-24 NOTE — Progress Notes (Signed)
Inpatient Rehabilitation Care Coordinator Discharge Note  The overall goal for the admission was met for:   Discharge location: Yes-HOME WITH WIFE WHO CAN PROVIDE 24/7 SUPERVISION  Length of Stay: Yes-6 DAYS  Discharge activity level: Yes-MOD/I-SUPERVISION LEVEL  Home/community participation: Yes  Services provided included: MD, RD, PT, OT, SLP, RN, CM, Pharmacy, and SW  Financial Services: Medicare and Private Insurance: [place] offered to/list presented to:PT AND WIFE  Follow-up services arranged: Outpatient: [place] [facility]-PT WILL CALL WIFE TO ARRANGE APPOINTMENTS  Comments (or additional information):PT DID WELL AND REACHED MOD/I LEVEL  Patient/Family verbalized understanding of follow-up arrangements: Yes  Individual responsible for coordination of the follow-up plan: [name]-WIFE [phone]  Confirmed correct DME delivered: Elease Hashimoto 01/24/2021    Gerad Cornelio, [place] Rhyme

## 2021-01-24 NOTE — Progress Notes (Signed)
Occupational Therapy Session Note  Patient Details  Name: Stanley [name]: [id number] Date of Birth: 15-May-1946  Today's Date: 01/24/2021 OT Individual Time: 1130-1200 OT Individual Time Calculation (min): 30 min    Short Term Goals: Week 1:  OT Short Term Goal 1 (Week 1): STGs equal to LTGs set at modified independent overall.    Skilled Therapeutic Interventions/Progress Updates:    Pt greeted at time of session sitting up in chair with wife present, performing grooming task of clipping his nails. After finished, discussed with pt/wife any last minute concerns before going home. Wife concerned about use of AD and cognition, did recommend pt uses RW and/or cane based on PT recommendations. Pt ambulated room > elevators with wife> gym with Supervision with SPC. In gym, focused on standing balance while performing cognitive task of PVC pipe building 2 trials increasing difficulty with 100% accuracy. However pt could not demonstrate alternating attention going back and forth from conversation with therapist to <> from pipe activity. Once conversation paused, pt able to continue task. Ambulated gym > room Supervision up in chair alarm on call bell in reach.   Therapy Documentation Precautions:  Precautions Precautions: Fall Precaution Comments: Had fall on saturday when he first had symptoms Restrictions Weight Bearing Restrictions: No    Therapy/Group: Individual Therapy  Erasmo Score 01/24/2021, 7:21 AM

## 2021-01-24 NOTE — Progress Notes (Signed)
Receive call from [place] regarding [name] pulse. She was instructed to check apical pulse. Dr. Carlis Abbott note was reviewed and this provider spoke with pharmacist. We will continue metoprolol per Dr Carlis Abbott note and will continue to monitor.

## 2021-01-24 NOTE — Progress Notes (Signed)
Occupational Therapy Session Note  Patient Details  Name: Stanley [name]: [id number] Date of Birth: Oct 05, 1945  Today's Date: 01/24/2021 OT Individual Time: 1003-1100 OT Individual Time Calculation (min): 57 min    Short Term Goals: Week 1:  OT Short Term Goal 1 (Week 1): STGs equal to LTGs set at modified independent overall.  Skilled Therapeutic Interventions/Progress Updates:    Pt received in room in recliner and consented to OT tx. Session focused on ADL analysis including bathing, dressing, grooming, and functional mobility with SPC. Pt walked with SPC with SUP and transferred into shower with SUP. Completed bathing with setup assist and time for thoroughness. Pt able to dress himself sitting/standing EOB independently. After ADLs, pt walked with [facility] and close SUP from room to day room for cognitive color task to work on problem solving skills. Wife brought in personal [facility] for pt to use. Pt completed 3 tasks without cues and increased time and 1 cue to finish for final task. Remainder of time utilized to instruct in BUE strengthening HEP to increase strength and activity tolerance for ADLs. Pt instructed in 4# db unilateral exercises including elbow flexion and shoulder press for 3x15 with min cuing for proper technique with good carryover. After tx, pt walked with SUP and SPC back to room and left up in recliner with alarm on and all needs met, wife present. Educated wife in pt's overall supervision level upon discharge, and to slowly start incorporating regular routine into his day, educated on other ECTs and safety.   Therapy Documentation Precautions:  Precautions Precautions: Fall Precaution Comments: mild L hemi Restrictions Weight Bearing Restrictions: No  Pain: Pain Assessment Pain Scale: 0-10 Pain Score: 0-No pain Faces Pain Scale: No hurt    Therapy/Group: Individual Therapy  Teresita Fanton 01/24/2021, 10:11 AM

## 2021-01-24 NOTE — Progress Notes (Signed)
Speech Language Pathology Discharge Summary  Patient Details  Name: Stanley [name]: [id number] Date of Birth: 07/15/1946  Today's Date: 01/24/2021 SLP Individual Time: 1417-1500 SLP Individual Time Calculation (min): 43 min  Patient has met 4 of 4 long term goals.  Patient to discharge at overall Supervision level. Pt appears to be at baseline function for memory and problem solving. Pt endorses deficits in short term recall prior to CVA and utilizes wife and writing information down to compensate. Pt remains with mild impulsivity and decreased insight into deficits, however very responsive to cues and education to increase safety during daily living tasks. Patient's wife will be present to provide assist with higher level cognitive tasks as needed, he states she is already responsible for med management.   Care Partner:  Caregiver Able to Provide Assistance: Yes  Type of Caregiver Assistance: Physical;Cognitive  Recommendation:  None     Equipment: none   Reasons for discharge: Treatment goals met;Discharged from hospital   Patient/Family Agrees with Progress Made and Goals Achieved: Yes    Skilled Therapeutic Interventions:  Pt seen for final tx session targeting cognitive goals. Pt completing SLUMS assessment with a score of 20/30 [facility] = 27+), with deficits only in areas of recall. Pt able to recall 1/5 words and 1/4 story elements despite verbal cues, states this is his baseline function with no change. Educated on continuing compensatory strategy of writing things down to increase recall of important dates, numbers, appointments, etc. Pt states wife is responsible for med management and will assist with higher level cog tasks as needed. Pt able to recall safety precautions for mobility with 100% accuracy independent. Pt left in chair with alarm set and all questions answered. Continue ST POC.     Dewaine Conger 01/24/2021, 2:40 PM

## 2021-01-24 NOTE — Progress Notes (Signed)
Occupational Therapy Discharge Summary  Patient Details  Name: Stanley [name]: [id number] Date of Birth: [birth year]    Patient has met 7 of 12 long term goals due to improved activity tolerance, improved balance, functional use of  LEFT upper extremity, improved awareness, and improved coordination.  Patient to discharge at overall Supervision level.  Patient's care partner is independent to provide the necessary cognitive assistance at discharge. Pt continues to require cuing/supervision for safety awareness and for proper use of AD for maximal safety.   Reasons goals not met: Pt requires SUP for bathroom transfers and light IADLs for safety at time of discharge.   Recommendation:  Patient will benefit from ongoing skilled OT services in outpatient setting to continue to advance functional skills in the area of iADL, balance, and safety.  Equipment: No equipment provided  Reasons for discharge: treatment goals met and discharge from hospital  Patient/family agrees with progress made and goals achieved: Yes  OT Discharge Precautions/Restrictions  Precautions Precautions: Fall Precaution Comments: mild L hemi Restrictions Weight Bearing Restrictions: No  Pain Pain Assessment Pain Scale: 0-10 Pain Score: 0-No pain Faces Pain Scale: No hurt ADL ADL Eating: Independent Where Assessed-Eating: Edge of bed Grooming: Independent Where Assessed-Grooming: Standing at sink Upper Body Bathing: Setup Where Assessed-Upper Body Bathing: Chair, Shower Lower Body Bathing: Setup Where Assessed-Lower Body Bathing: Chair, Shower Upper Body Dressing: Independent Where Assessed-Upper Body Dressing: Edge of bed Lower Body Dressing: Independent Where Assessed-Lower Body Dressing: Edge of bed Toileting: Supervision/safety Where Assessed-Toileting: Glass blower/designer: Close supervision Toilet Transfer Method: Ambulating (with SPC) Science writer: Raised toilet seat,  Grab bars Tub/Shower Transfer: Close supervison Clinical cytogeneticist Method: Magazine features editor: Close supervision Social research officer, government Method: Heritage manager: Gaffer Baseline Vision/History: No visual deficits Patient Visual Report: No change from baseline Vision Assessment?: Yes Eye Alignment: Within Functional Limits Ocular Range of Motion: Within Functional Limits Alignment/Gaze Preference: Within Defined Limits Convergence: Within functional limits Perception  Perception: Within Functional Limits Praxis Praxis: Intact Cognition Overall Cognitive Status: Within Functional Limits for tasks assessed Arousal/Alertness: Awake/alert Orientation Level: Oriented X4 Attention: Sustained;Focused;Selective Focused Attention: Appears intact Sustained Attention: Appears intact Sustained Attention Impairment: Verbal complex Memory: Impaired Awareness: Impaired Problem Solving: Appears intact Behaviors: Impulsive Safety/Judgment: Impaired Comments: decreased insight into deficits and mild impulsivity Sensation Sensation Light Touch: Appears Intact Hot/Cold: Appears Intact Proprioception: Appears Intact Stereognosis: Appears Intact Coordination Gross Motor Movements are Fluid and Coordinated: No Fine Motor Movements are Fluid and Coordinated: No Coordination and Movement Description: mild uncoordination due to L hemiparesis, generalized weakness/deconditioning, and decreased balance/postural control. Finger Nose Finger Test: mild dysmetria L>R Heel Shin Test: decreased coordination but ROM WFL Motor  Motor Motor: Hemiplegia;Abnormal postural alignment and control Motor - Skilled Clinical Observations: mild uncoordination due to L hemiparesis, generalized weakness/deconditioning, and decreased balance/postural control. Mobility  Bed Mobility Bed Mobility: Rolling Right;Rolling Left;Supine to Sit;Sit to Supine Rolling  Right: Independent Rolling Left: Independent Supine to Sit: Independent Sit to Supine: Independent Transfers Sit to Stand: Supervision/Verbal cueing Stand to Sit: Supervision/Verbal cueing  Trunk/Postural Assessment  Cervical Assessment Cervical Assessment: Exceptions to [facility] (fwd  head) Thoracic Assessment Thoracic Assessment: Exceptions to [facility] (mild kyphosis) Lumbar Assessment Lumbar Assessment: Exceptions to [facility] (posterior pelvic tilt) Postural Control Postural Control: Deficits on evaluation Righting Reactions: delayed/insufficient  Balance Balance Balance Assessed: Yes Static Sitting Balance Static Sitting - Balance Support: Feet supported;Bilateral upper extremity supported Static Sitting - Level of Assistance: 6: Modified independent (  Device/Increase time) Dynamic Sitting Balance Dynamic Sitting - Balance Support: Feet supported;No upper extremity supported Dynamic Sitting - Level of Assistance: 6: Modified independent (Device/Increase time) Static Standing Balance Static Standing - Balance Support: Right upper extremity supported (SPC) Static Standing - Level of Assistance: 5: Stand by assistance (supervision) Dynamic Standing Balance Dynamic Standing - Balance Support: Right upper extremity supported (SPC) Dynamic Standing - Level of Assistance: 5: Stand by assistance (supervision) Extremity/Trunk Assessment RUE Assessment RUE Assessment: Within Functional Limits LUE Assessment LUE Assessment: Within Functional Limits   [employer] 01/24/2021, 10:27 AM

## 2021-01-24 NOTE — Progress Notes (Signed)
Physical Therapy Discharge Summary  Patient Details  Name: Stanley [name]: [id number] Date of Birth: Dec 19, 1945  Today's Date: 01/24/2021 PT Individual Time: 0900-0955 PT Individual Time Calculation (min): 55 min   Patient has met 7 of 7 long term goals due to improved activity tolerance, improved balance, improved postural control, improved attention, and improved coordination. Patient to discharge at an ambulatory level Supervision.  Pt's family did not attend formal family education session, however, pt's wife is aware of recommendation for use of SPC. This therapist educated pt heavily on importance of using SPC with all functional mobility upon discharge due to safety concerns, high fall risk status, and balance deficits.   All goals met   Recommendation:  Patient will benefit from ongoing skilled PT services in outpatient setting to continue to advance safe functional mobility, address ongoing impairments in transfers, generalized strengthening dynamic standing balance/coordination, gait training, NMR, endurance, and to minimize fall risk.  Equipment: No equipment provided; pt reports having a SPC at home to use  Reasons for discharge: treatment goals met and discharge from hospital  Patient/family agrees with progress made and goals achieved: Yes  Today's Interventions: Received pt supine in bed, pt agreeable to PT treatment, and denied any pain during session. Session with focus on discharge planning, functional mobility/transfers, generalized strengthening, dynamic standing balance/coordination, gait training, toileting, simulated car transfers, stair navigation, and improved activity tolerance. Pt reported urge to urinate and performed bed mobility independently. Ambulated 23ft without AD and close supervision to bathroom and able to manage clothing and void with supervision. Pt stood at sink and washed hands with supervision. Donned shoes sitting in recliner with set up  assist. Pt ambulated 52ft with SPC and close supervision to ortho gym and performed ambulatory simulated car transfer with [facility] and supervision with cues to avoid holding onto mobile doorframe. Pt ambulated 58ft on uneven surfaces (ramp and mulch) and descended 1 6in step with SPC and supervision with cues for AD management when descending curb. Pt able to stand and pick up lego from floor with SPC and CGA. Pt ambulated additional 30ft with SPC and supervision and navigated 12 steps with L handrail and supervision using a lateral stepping technique. Pt performed the following activities Biodex with emphasis on weight shifting and dynamic standing balance: -postural stability training for 1 minute on level static with no UE support and supervision -postural stability training for 1 minute on level 6 with BUE fading to 1 UE support with CGA/min A for balance.  -limits of stability training on level static with BUE support for 1 minute with supervision -limits of stability training on level 6 with BUE support for 45 seconds with CGA -catch baseball game on level hard with BUE support for 3 minutes on level static with supervision.  Pt ambulated to // bars (~57ft) and worked on static standing balance on Bosu for 1 minute x 2 trials without UE support and min A for balance. Pt then ambulated 160ft with SPC and supervision back to room and requested to use restroom again (see earlier in note for details). Concluded session with pt sitting in recliner, needs within reach, and chair pad alarm on.   PT Discharge Precautions/Restrictions Precautions Precautions: Fall Precaution Comments: mild L hemi Restrictions Weight Bearing Restrictions: No Cognition Overall Cognitive Status: Within Functional Limits for tasks assessed Arousal/Alertness: Awake/alert Orientation Level: Oriented X4 Memory: Impaired Awareness: Impaired Problem Solving: Appears intact Behaviors: Impulsive Safety/Judgment:  Impaired Comments: decreased insight into deficits and mild impulsivity Sensation  Sensation Light Touch: Appears Intact Proprioception: Appears Intact Coordination Gross Motor Movements are Fluid and Coordinated: No Fine Motor Movements are Fluid and Coordinated: No Coordination and Movement Description: mild uncoordination due to L hemiparesis, generalized weakness/deconditioning, and decreased balance/postural control. Finger Nose Finger Test: mild dysmetria L>R Heel Shin Test: decreased coordination but ROM WFL Motor  Motor Motor: Hemiplegia;Abnormal postural alignment and control Motor - Skilled Clinical Observations: mild uncoordination due to L hemiparesis, generalized weakness/deconditioning, and decreased balance/postural control.  Mobility Bed Mobility Bed Mobility: Rolling Right;Rolling Left;Supine to Sit;Sit to Supine Rolling Right: Independent Rolling Left: Independent Supine to Sit: Independent Sit to Supine: Independent Transfers Transfers: Sit to Stand;Stand Pivot Transfers;Stand to Sit Sit to Stand: Supervision/Verbal cueing Stand to Sit: Supervision/Verbal cueing Stand Pivot Transfers: Supervision/Verbal cueing Stand Pivot Transfer Details: Verbal cues for safe use of DME/AE;Verbal cues for technique Stand Pivot Transfer Details (indicate cue type and reason): verbal cues for turning technique and AD management Transfer (Assistive device): Straight cane Locomotion  Gait Ambulation: Yes Gait Assistance: Supervision/Verbal cueing Gait Distance (Feet): 150 Feet Assistive device: Straight cane Gait Assistance Details: Verbal cues for precautions/safety;Verbal cues for safe use of DME/AE Gait Assistance Details: verbal cues to increase LLE step length and for AD management Gait Gait: Yes Gait Pattern: Impaired Gait Pattern: Step-to pattern;Decreased step length - left;Decreased stance time - left;Decreased stride length;Trunk flexed;Poor foot clearance -  right;Poor foot clearance - left;Narrow base of support;Decreased trunk rotation Gait velocity: decreased Stairs / Additional Locomotion Stairs: Yes Stairs Assistance: Supervision/Verbal cueing Stair Management Technique: One rail Left Number of Stairs: 12 Height of Stairs: 6 Ramp: Supervision/Verbal cueing (SPC) Curb: Supervision/Verbal cueing [facility]) Wheelchair Mobility Wheelchair Mobility: No  Trunk/Postural Assessment  Cervical Assessment Cervical Assessment: Exceptions to [facility] (forward head) Thoracic Assessment Thoracic Assessment: Exceptions to [facility] (kyphosis) Lumbar Assessment Lumbar Assessment: Exceptions to [facility] (posterior pelvic tilt) Postural Control Postural Control: Deficits on evaluation  Balance Balance Balance Assessed: Yes Static Sitting Balance Static Sitting - Balance Support: Feet supported;Bilateral upper extremity supported Static Sitting - Level of Assistance: 6: Modified independent (Device/Increase time) Dynamic Sitting Balance Dynamic Sitting - Balance Support: Feet supported;No upper extremity supported Dynamic Sitting - Level of Assistance: 6: Modified independent (Device/Increase time) Static Standing Balance Static Standing - Balance Support: Right upper extremity supported (SPC) Static Standing - Level of Assistance: 5: Stand by assistance (supervision) Dynamic Standing Balance Dynamic Standing - Balance Support: Right upper extremity supported (SPC) Dynamic Standing - Level of Assistance: 5: Stand by assistance (supervision) Extremity Assessment  RLE Assessment RLE Assessment: Within Functional Limits General Strength Comments: 5/5 grossly LLE Assessment LLE Assessment: Exceptions to [facility] General Strength Comments: grossly generalized to 4+5  Alfonse Alpers PT, DPT  01/24/2021, 7:31 AM

## 2021-01-24 NOTE — Progress Notes (Signed)
PROGRESS NOTE   Subjective/Complaints: No questions this AM Says his wife's questions were answered yesterday Moving bowels regularly Sleeping well- wakes at 3:30 as he has for years due to prior job  ROS: Denies pain  Objective:   No results found. No results for input(s): WBC, HGB, HCT, PLT in the last 72 hours.  No results for input(s): NA, K, CL, CO2, GLUCOSE, BUN, CREATININE, CALCIUM in the last 72 hours.   Intake/Output Summary (Last 24 hours) at 01/24/2021 1315 Last data filed at 01/24/2021 0753 Gross per 24 hour  Intake 1200 ml  Output --  Net 1200 ml        Physical Exam: Vital Signs Blood pressure (!) 145/88, pulse (!) 55, temperature 97.8 F (36.6 C), resp. rate 19, height 5\' 11"  (1.803 m), weight 101.6 kg, SpO2 99 %. Gen: no distress, normal appearing HEENT: oral mucosa pink and moist, NCAT Cardio: Bradycardia Chest: normal effort, normal rate of breathing Abd: soft, non-distended Ext: no edema Psych: pleasant, normal affect Skin: intact Neurologic: Cranial nerves II through XII intact, motor strength is 5/5 in RIght and 4/5 Leftdeltoid, bicep, tricep, grip, hip flexor, knee extensors, ankle dorsiflexor and plantar flexor Sensory exam normal sensation to light touch and proprioception in bilateral upper and lower extremities Cerebellar exam mild dysmetria Left FNF Musculoskeletal: Full range of motion in all 4 extremities. No joint swelling   Assessment/Plan: 1. Functional deficits which require 3+ hours per day of interdisciplinary therapy in a comprehensive inpatient rehab setting. Physiatrist is providing close team supervision and 24 hour management of active medical problems listed below. Physiatrist and rehab team continue to assess barriers to discharge/monitor patient progress toward functional and medical goals  Care Tool:  Bathing    Body parts bathed by patient: Right arm, Left arm,  Chest, Abdomen, Front perineal area, Buttocks, Right upper leg, Left upper leg, Right lower leg, Left lower leg, Face         Bathing assist Assist Level: Set up assist     Upper Body Dressing/Undressing Upper body dressing   What is the patient wearing?: Pull over shirt    Upper body assist Assist Level: Independent    Lower Body Dressing/Undressing Lower body dressing      What is the patient wearing?: Underwear/pull up, Pants     Lower body assist Assist for lower body dressing: Independent     Toileting Toileting    Toileting assist Assist for toileting: Supervision/Verbal cueing     Transfers Chair/bed transfer  Transfers assist     Chair/bed transfer assist level: Supervision/Verbal cueing     Locomotion Ambulation   Ambulation assist      Assist level: Supervision/Verbal cueing Assistive device: Cane-straight Max distance: 170ft   Walk 10 feet activity   Assist     Assist level: Supervision/Verbal cueing Assistive device: Cane-straight   Walk 50 feet activity   Assist    Assist level: Supervision/Verbal cueing Assistive device: Cane-straight    Walk 150 feet activity   Assist    Assist level: Supervision/Verbal cueing Assistive device: Cane-straight    Walk 10 feet on uneven surface  activity   Assist  Assist level: Supervision/Verbal cueing Assistive device: Ship broker Will patient use wheelchair at discharge?: No   Wheelchair activity did not occur: N/A         Wheelchair 50 feet with 2 turns activity    Assist    Wheelchair 50 feet with 2 turns activity did not occur: N/A       Wheelchair 150 feet activity     Assist  Wheelchair 150 feet activity did not occur: N/A       Blood pressure (!) 145/88, pulse (!) 55, temperature 97.8 F (36.6 C), resp. rate 19, height 5\' 11"  (1.803 m), weight 101.6 kg, SpO2 99 %.  Medical Problem List and Plan: 1.  Decreased  self-care and mobility secondary to right internal capsule infarct             -patient may  shower             -ELOS/Goals: 5 to 7 days Continue CIR evals PT< OT 2.  Impaired mobility -DVT/anticoagulation:  Pharmaceutical: Other (comment)--Eliquis             -antiplatelet therapy: Continue Low dose ASA added 06/16 3. Pain Management: N/A 4. Mood: LCSW to follow for evaluation and support.              -antipsychotic agents: N/A 5. Neuropsych: This patient is capable of making decisions on his own behalf. 6. Skin/Wound Care:  Routine pressure relief measures.  7. Fluids/Electrolytes/Nutrition: Monitor I/O.drinking well d/c IV 8. H/o A fib/Aflutter: Monitor HR TID.  Vitals:   01/23/21 1951 01/24/21 0339  BP: (!) 139/96 (!) 145/88  Pulse: 73 (!) 55  Resp: 18 19  Temp: 97.8 F (36.6 C) 97.8 F (36.6 C)  SpO2: 96% 99%  Permissive HTN keep pressures < 200/110 until ~1wk post CVA (01/23/21), may need to adjust meds then 9. CAD s/p CABG 2003: ContinueToprol XL, Crestor, Entesto, Imdur and ASA --Monitor for symptoms with increase in activity 10. CM /Chronic systolic CHF: EF 2004. Will monitor for signs of overload and check weights daily.             --heart healthy diet.             --continue Entresto, Imdur, Metoprolol and Crestor.   Weights stable Filed Weights   01/22/21 0500 01/23/21 0500 01/24/21 0629  Weight: 101.3 kg 100.6 kg 101.6 kg    11. T2DM: Hgb A1C-6.7 - now diabetic-->Add CM restrictions. Provided dietary education. Defers medications, May stop CBG checks, discussed with patient.  CBG (last 3)  Recent Labs    01/22/21 1613 01/22/21 2134 01/23/21 0603  GLUCAP 128* 112* 121*   12.OSA: Refusing CPAP, discuss benefits of using CPAP. Does not feel sleepy during the day.  13. Bradycardia: continue to monitor HR TID. Decrease Toprol XR to 50mg  BID. Will defer further decrease given elevated SBP    LOS: 5 days A FACE TO FACE EVALUATION WAS PERFORMED  Stanley Wells P  Obbie Lewallen 01/24/2021, 1:15 PM

## 2021-01-25 ENCOUNTER — Telehealth: Payer: Self-pay | Admitting: Cardiology

## 2021-01-25 DIAGNOSIS — E119 Type 2 diabetes mellitus without complications: Secondary | ICD-10-CM

## 2021-01-25 MED ORDER — METOPROLOL SUCCINATE ER 50 MG PO TB24: 50.0000 mg | ORAL_TABLET | Freq: Two times a day (BID) | ORAL | 3 refills | Status: DC

## 2021-01-25 NOTE — Telephone Encounter (Signed)
Advised wife ok to cancel echo.  Pt had echo 01/18/2021 during hospitalization.  Echo cancelled.

## 2021-01-25 NOTE — Progress Notes (Signed)
Patient vital signs (Pulse) assessed prior to metoprolol administration. Radial 57, Apical 60. Night On-call notified. Advised patient be monitored and pulse rechecked at 22:00 pm. Same done. Apical pulse 57 bpm at 22:10 pm. On call notified and clearance received to administer metoprolol. Same done at 20:33 pm. Patient made comfortable in bed.

## 2021-01-25 NOTE — Progress Notes (Signed)
Patient discharged home with wife via private transport. Provider went over discharge instructions with patient and wife. All questions answered. Vital signs stable.

## 2021-01-25 NOTE — Progress Notes (Signed)
PROGRESS NOTE   Subjective/Complaints: Overnight, pulse was 57- patient received metoprolol after clearance by on-call provider. No other issues Vital signs stable Stable for d/c today  ROS: Denies pain, constipation  Objective:   No results found. No results for input(s): WBC, HGB, HCT, PLT in the last 72 hours.  No results for input(s): NA, K, CL, CO2, GLUCOSE, BUN, CREATININE, CALCIUM in the last 72 hours.   Intake/Output Summary (Last 24 hours) at 01/25/2021 0601 Last data filed at 01/24/2021 1832 Gross per 24 hour  Intake 1920 ml  Output --  Net 1920 ml        Physical Exam: Vital Signs Blood pressure 130/80, pulse 60, temperature 98.8 F (37.1 C), temperature source Oral, resp. rate 17, height 5\' 11"  (1.803 m), weight 101.6 kg, SpO2 97 %. Gen: no distress, normal appearing HEENT: oral mucosa pink and moist, NCAT Cardio: Bradycardia Chest: normal effort, normal rate of breathing Abd: soft, non-distended Ext: no edema Psych: pleasant, normal affect Skin: intact Neurologic: Cranial nerves II through XII intact, motor strength is 5/5 in RIght and 4/5 Leftdeltoid, bicep, tricep, grip, hip flexor, knee extensors, ankle dorsiflexor and plantar flexor Sensory exam normal sensation to light touch and proprioception in bilateral upper and lower extremities Cerebellar exam mild dysmetria Left FNF Musculoskeletal: Full range of motion in all 4 extremities. No joint swelling   Assessment/Plan: 1. Functional deficits which require 3+ hours per day of interdisciplinary therapy in a comprehensive inpatient rehab setting. Physiatrist is providing close team supervision and 24 hour management of active medical problems listed below. Physiatrist and rehab team continue to assess barriers to discharge/monitor patient progress toward functional and medical goals  Care Tool:  Bathing    Body parts bathed by patient: Right  arm, Left arm, Chest, Abdomen, Front perineal area, Buttocks, Right upper leg, Left upper leg, Right lower leg, Left lower leg, Face         Bathing assist Assist Level: Set up assist     Upper Body Dressing/Undressing Upper body dressing   What is the patient wearing?: Pull over shirt    Upper body assist Assist Level: Independent    Lower Body Dressing/Undressing Lower body dressing      What is the patient wearing?: Underwear/pull up, Pants     Lower body assist Assist for lower body dressing: Independent     Toileting Toileting    Toileting assist Assist for toileting: Supervision/Verbal cueing     Transfers Chair/bed transfer  Transfers assist     Chair/bed transfer assist level: Supervision/Verbal cueing     Locomotion Ambulation   Ambulation assist      Assist level: Supervision/Verbal cueing Assistive device: Cane-straight Max distance: 16ft   Walk 10 feet activity   Assist     Assist level: Supervision/Verbal cueing Assistive device: Cane-straight   Walk 50 feet activity   Assist    Assist level: Supervision/Verbal cueing Assistive device: Cane-straight    Walk 150 feet activity   Assist    Assist level: Supervision/Verbal cueing Assistive device: Cane-straight    Walk 10 feet on uneven surface  activity   Assist     Assist level: Supervision/Verbal cueing  Assistive device: Ship broker Will patient use wheelchair at discharge?: No   Wheelchair activity did not occur: N/A         Wheelchair 50 feet with 2 turns activity    Assist    Wheelchair 50 feet with 2 turns activity did not occur: N/A       Wheelchair 150 feet activity     Assist  Wheelchair 150 feet activity did not occur: N/A       Blood pressure 130/80, pulse 60, temperature 98.8 F (37.1 C), temperature source Oral, resp. rate 17, height 5\' 11"  (1.803 m), weight 101.6 kg, SpO2 97 %.  Medical  Problem List and Plan: 1.  Decreased self-care and mobility secondary to right internal capsule infarct             -patient may  shower             -ELOS/Goals: 5 to 7 days Continue CIR evals PT< OT 2.  Impaired mobility -DVT/anticoagulation:  Pharmaceutical: Other (comment)--Eliquis             -antiplatelet therapy: Continue Low dose ASA added 06/16 3. Pain Management: N/A 4. Mood: LCSW to follow for evaluation and support.              -antipsychotic agents: N/A 5. Neuropsych: This patient is capable of making decisions on his own behalf. 6. Skin/Wound Care:  Routine pressure relief measures.  7. Fluids/Electrolytes/Nutrition: Monitor I/O.drinking well d/c IV 8. H/o A fib/Aflutter: Monitor HR TID.  Vitals:   01/24/21 2210 01/25/21 0441  BP:  130/80  Pulse: (!) 57 60  Resp:  17  Temp:  98.8 F (37.1 C)  SpO2:    Permissive HTN keep pressures < 200/110 until ~1wk post CVA (01/23/21), may need to adjust meds then 9. CAD s/p CABG 2003: ContinueToprol XL, Crestor, Entesto, Imdur and ASA --Monitor for symptoms with increase in activity 10. CM /Chronic systolic CHF: EF 2004. Will monitor for signs of overload and check weights daily.             --heart healthy diet.             --continue Entresto, Imdur, Metoprolol and Crestor.   Weights stable Filed Weights   01/22/21 0500 01/23/21 0500 01/24/21 0629  Weight: 101.3 kg 100.6 kg 101.6 kg    11. T2DM: Hgb A1C-6.7 - now diabetic-->Add CM restrictions. Provided dietary education. Defers medications, May stop CBG checks, discussed with patient.  CBG (last 3)  Recent Labs    01/22/21 1613 01/22/21 2134 01/23/21 0603  GLUCAP 128* 112* 121*   12.OSA: Refusing CPAP, discuss benefits of using CPAP. Does not feel sleepy during the day.  13. Bradycardia: continue to monitor HR TID. Decrease Toprol XR to 50mg  BID. Will defer further decrease given elevated SBP   >30 minutes spent in discharge of patient including review of  medications and follow-up appointments, physical examination, and in answering all patient's questions     LOS: 6 days A FACE TO FACE EVALUATION WAS PERFORMED  Rhyli Depaula P Lanard Arguijo 01/25/2021, 6:01 AM

## 2021-01-25 NOTE — Discharge Summary (Signed)
Physician Discharge Summary  Patient ID: Stanley [name]: [id number] DOB/AGE: January 17, 1946 75 y.o.  Admit date: 01/19/2021 Discharge date: 01/25/2021  Discharge Diagnoses:  Principal Problem:   Subcortical infarction [facility]) Active Problems:   Essential hypertension   Long term current use of anticoagulant therapy - Eliquis   Atrial fibrillation (HCC)   Diabetes mellitus, new onset (HCC)   Discharged Condition: good  Significant Diagnostic Studies: N/a   Labs:  Basic Metabolic Panel: BMP Latest Ref Rng & Units 01/20/2021 01/16/2021 01/14/2021  Glucose 70 - 99 mg/dL 903(E) 092(Z) 300(T)  BUN 8 - 23 mg/dL 20 19 62(U)  Creatinine 0.61 - 1.24 mg/dL 6.33(H) 5.45 6.25(W)  Sodium 135 - 145 mmol/L 138 140 137  Potassium 3.5 - 5.1 mmol/L 3.7 3.9 3.5  Chloride 98 - 111 mmol/L 100 109 106  CO2 22 - 32 mmol/L 29 24 24   Calcium 8.9 - 10.3 mg/dL 9.4 ) 9.0     CBC: CBC Latest Ref Rng & Units 01/20/2021 01/16/2021 01/14/2021  WBC 4.0 - 10.5 K/uL 9.7 7.3 10.4  Hemoglobin 13.0 - 17.0 g/dL 01/16/2021 37.3 42.8  Hematocrit 39.0 - 52.0 % 43.0 43.3 44.1  Platelets 150 - 400 K/uL 197 202 232     CBG: Recent Labs  Lab 01/22/21 0602 01/22/21 1116 01/22/21 1613 01/22/21 2134 01/23/21 0603  GLUCAP 116* 103* 128* 112* 121*    Brief HPI:   Stanley Wells is a 75 y.o. male with history of CAD, HTN, chronic combined CHF, A. fib/flutter was admitted on 01/16/2021 with left-sided weakness with Grip on 01/11/2021.  MRI brain done revealing 1 cm acute infarct in posterior basal ganglia/posterior limb internal capsule on right as well as evidence of old microhemorrhages with hemosiderin deposits.  CTA head/neck was negative for occlusion or high-grade stenosis.  Dr. 03/13/2021 recommended adding low-dose aspirin as well as control of risk factors.  Therapy was initiated and limited by unsteady gait with balance deficits, gait instability and had poor awareness of deficits.  CIR was recommended due to functional  decline.   Hospital Course: Stanley Wells was admitted to rehab 01/19/2021 for inpatient therapies to consist of PT, ST and OT at least three hours five days a week. Past admission physiatrist, therapy team and rehab RN have worked together to provide customized collaborative inpatient rehab.  His blood pressures were monitored on TID basis and have been reasonably controlled on current regimen.  Metoprolol was decreased to 50 mg twice daily to avoid bradycardia.  He is tolerating low-dose ASA in addition to his Eliquis without any side effects.  Follow-up labs shows H&H stable.  Check of electrolytes showed improvement in acute on chronic renal failure.  Patient and wife educated on carb modified diet due to new onset diabetes/elevated hemoglobin A1c.  Blood sugars were monitored on ac/hs basis on carb modified diet and reasonably controlled.  He is to follow-up with PCP for further management and input on diabetes management.  He has made good gains during his stay and supervision due to impulsivity and for safety.  He will continue to receive follow-up outpatient PT [facility] outpatient therapy after discharge   Rehab course: During patient's stay in rehab team conference was held to monitor patient's progress, set goals and discuss barriers to discharge. At admission, patient required min assist with mobility and ADL tasks.  He exhibited mild cognitive linguistic deficits with poor awareness of deficits and impaired problem-solving. He  has had improvement in activity tolerance, balance,  postural control as well as ability to compensate for deficits. He requires supervision with cues for safety with ADL tasks.  He requires supervision for transfers and to ambulate 150 feet with straight cane.  He requires supervision with cognitive tasks due to short-term memory deficits from prior CVA as well as impulsivity.  Family education was completed with wife.  Discharge disposition: 01-Home or Self  Care  Diet: Carb modified/heart healthy  Special Instructions: 1.  No driving or strenuous activity till cleared by MD. 2.  Wife advised to assist with medication management.  Allergies as of 01/25/2021   No Known Allergies      Medication List     TAKE these medications    acetaminophen 325 MG tablet Commonly known as: TYLENOL Take 1-2 tablets (325-650 mg total) by mouth every 4 (four) hours as needed for mild pain. What changed:  medication strength how much to take when to take this   apixaban 5 MG Tabs tablet Commonly known as: Eliquis TAKE 1 TABLET(5 MG) BY MOUTH TWICE DAILY   aspirin 81 MG EC tablet Take 1 tablet (81 mg total) by mouth daily. Swallow whole. Notes to patient: THIS IS THE NEW BLOOD THINNER THAT WAS ADDED TO PREVENT STROKES--NEED TO PURCHASE THIS OVER THE COUNTER   Entresto 97-103 MG Generic drug: sacubitril-valsartan TAKE 1 TABLET BY MOUTH TWICE DAILY   furosemide 40 MG tablet Commonly known as: Lasix Take 1 tablet (40 mg total) by mouth as needed.   isosorbide mononitrate 30 MG 24 hr tablet Commonly known as: IMDUR TAKE 1 TABLET(30 MG) BY MOUTH DAILY   metoprolol succinate 50 MG 24 hr tablet Commonly known as: TOPROL-XL Take 1 tablet (50 mg total) by mouth 2 (two) times daily. Take with or immediately following a meal. What changed: how much to take   rosuvastatin 40 MG tablet Commonly known as: CRESTOR TAKE 1 TABLET(40 MG) BY MOUTH DAILY        Follow-up Information     [name], MD Follow up.   Specialty: Family Medicine Why: Call for post hospital follow up Contact information: [address] [place] [address] [phone]         Laurey Morale, MD. Call.   Specialty: Cardiology Why: for routine check Contact information: [address] [place] [address] [phone]         Horton Chin, MD Follow up.   Specialty: Physical Medicine and Rehabilitation Why: 04/05/21 please arrive  in office at 1:20pm for 1:40pm appointment Contact information: [address] [place] [address] [phone]         [facility]. Call on 01/25/2021.   Specialty: Neurology Why: call for appointment Contact information: [address] [place] [address] [phone]                Signed: Jacquelynn Cree 01/25/2021, 6:18 PM

## 2021-01-25 NOTE — Telephone Encounter (Signed)
Pt wife is calling to confirm that it was ok to cancel the ECHO he had on 7/6 since he had one wihile admitted in the hospital

## 2021-01-29 ENCOUNTER — Telehealth: Payer: Self-pay

## 2021-01-29 NOTE — Telephone Encounter (Signed)
Transitional Care call--wife-[name]    Are you/is patient experiencing any problems since coming home? No Are there any questions regarding any aspect of care? No Are there any questions regarding medications administration/dosing? No Are meds being taken as prescribed? Yes Patient should review meds with caller to confirm Have there been any falls? No Has Home Health been to the house and/or have they contacted you? Outpatient, appt already set up If not, have you tried to contact them? Can we help you contact them? Are bowels and bladder emptying properly? Yes Are there any unexpected incontinence issues? No If applicable, is patient following bowel/bladder programs? Any fevers, problems with breathing, unexpected pain? No Are there any skin problems or new areas of breakdown? No Has the patient/family member arranged specialty MD follow up (ie cardiology/neurology/renal/surgical/etc)? Yes Can we help arrange? Does the patient need any other services or support that we can help arrange? No  Are caregivers following through as expected in assisting the patient? Yes Has the patient quit smoking, drinking alcohol, or using drugs as recommended? Yes, but maybe a cigarette or two  Appointment time 1:40 pm, arrive time 1:20 pm on 04/05/21 with Dr. Carlis Abbott [address]

## 2021-01-31 DIAGNOSIS — I639 Cerebral infarction, unspecified: Secondary | ICD-10-CM | POA: Diagnosis not present

## 2021-01-31 DIAGNOSIS — R2689 Other abnormalities of gait and mobility: Secondary | ICD-10-CM | POA: Diagnosis not present

## 2021-01-31 DIAGNOSIS — R531 Weakness: Secondary | ICD-10-CM | POA: Diagnosis not present

## 2021-02-01 DIAGNOSIS — I259 Chronic ischemic heart disease, unspecified: Secondary | ICD-10-CM | POA: Diagnosis not present

## 2021-02-01 DIAGNOSIS — Z1331 Encounter for screening for depression: Secondary | ICD-10-CM | POA: Diagnosis not present

## 2021-02-01 DIAGNOSIS — R739 Hyperglycemia, unspecified: Secondary | ICD-10-CM | POA: Diagnosis not present

## 2021-02-01 DIAGNOSIS — Z0000 Encounter for general adult medical examination without abnormal findings: Secondary | ICD-10-CM | POA: Diagnosis not present

## 2021-02-01 DIAGNOSIS — Z6831 Body mass index (BMI) 31.0-31.9, adult: Secondary | ICD-10-CM | POA: Diagnosis not present

## 2021-02-06 ENCOUNTER — Other Ambulatory Visit (HOSPITAL_COMMUNITY): Payer: Medicare Other

## 2021-02-06 DIAGNOSIS — I639 Cerebral infarction, unspecified: Secondary | ICD-10-CM | POA: Diagnosis not present

## 2021-02-06 DIAGNOSIS — R531 Weakness: Secondary | ICD-10-CM | POA: Diagnosis not present

## 2021-02-06 DIAGNOSIS — R2689 Other abnormalities of gait and mobility: Secondary | ICD-10-CM | POA: Diagnosis not present

## 2021-02-07 DIAGNOSIS — I639 Cerebral infarction, unspecified: Secondary | ICD-10-CM | POA: Diagnosis not present

## 2021-02-11 DIAGNOSIS — I639 Cerebral infarction, unspecified: Secondary | ICD-10-CM | POA: Diagnosis not present

## 2021-02-14 DIAGNOSIS — I639 Cerebral infarction, unspecified: Secondary | ICD-10-CM | POA: Diagnosis not present

## 2021-02-18 DIAGNOSIS — I639 Cerebral infarction, unspecified: Secondary | ICD-10-CM | POA: Diagnosis not present

## 2021-02-20 DIAGNOSIS — I639 Cerebral infarction, unspecified: Secondary | ICD-10-CM | POA: Diagnosis not present

## 2021-02-25 DIAGNOSIS — I639 Cerebral infarction, unspecified: Secondary | ICD-10-CM | POA: Diagnosis not present

## 2021-02-27 DIAGNOSIS — I639 Cerebral infarction, unspecified: Secondary | ICD-10-CM | POA: Diagnosis not present

## 2021-03-04 DIAGNOSIS — I639 Cerebral infarction, unspecified: Secondary | ICD-10-CM | POA: Diagnosis not present

## 2021-03-06 DIAGNOSIS — I639 Cerebral infarction, unspecified: Secondary | ICD-10-CM | POA: Diagnosis not present

## 2021-03-11 DIAGNOSIS — R2689 Other abnormalities of gait and mobility: Secondary | ICD-10-CM | POA: Diagnosis not present

## 2021-03-11 DIAGNOSIS — R531 Weakness: Secondary | ICD-10-CM | POA: Diagnosis not present

## 2021-03-11 DIAGNOSIS — I639 Cerebral infarction, unspecified: Secondary | ICD-10-CM | POA: Diagnosis not present

## 2021-03-12 ENCOUNTER — Encounter: Payer: Self-pay | Admitting: Cardiology

## 2021-03-12 ENCOUNTER — Other Ambulatory Visit: Payer: Self-pay

## 2021-03-12 ENCOUNTER — Encounter (INDEPENDENT_AMBULATORY_CARE_PROVIDER_SITE_OTHER): Payer: Self-pay

## 2021-03-12 ENCOUNTER — Ambulatory Visit (INDEPENDENT_AMBULATORY_CARE_PROVIDER_SITE_OTHER): Payer: Medicare Other | Admitting: Cardiology

## 2021-03-12 VITALS — BP 118/88 | HR 57 | Ht 71.0 in | Wt 217.6 lb

## 2021-03-12 DIAGNOSIS — I480 Paroxysmal atrial fibrillation: Secondary | ICD-10-CM

## 2021-03-12 DIAGNOSIS — I5042 Chronic combined systolic (congestive) and diastolic (congestive) heart failure: Secondary | ICD-10-CM | POA: Diagnosis not present

## 2021-03-12 DIAGNOSIS — I639 Cerebral infarction, unspecified: Secondary | ICD-10-CM

## 2021-03-12 DIAGNOSIS — I4892 Unspecified atrial flutter: Secondary | ICD-10-CM | POA: Diagnosis not present

## 2021-03-12 NOTE — Progress Notes (Signed)
Electrophysiology Office Follow up Visit Note:    Date:  03/12/2021   ID:  Stanley Wells, DOB 1945/11/01, MRN [id number]  PCP:  [name], MD  [facility] Cardiologist:  None  [facility] Electrophysiologist:  Lanier Prude, MD    Interval History:    Stanley Wells is a 75 y.o. male who presents for a follow up visit after an atrial fibrillation ablation on April 11, 2020.  During that ablation, the pulmonary veins were isolated in addition to the posterior wall.  I also performed a cavotricuspid isthmus ablation.  He did require a cardioversion early after the ablation but is maintained sinus rhythm since then.  He takes Eliquis for stroke prophylaxis but unfortunately had an admission on January 16, 2021 for left-sided weakness.  He was diagnosed with a stroke at that time but thankfully he is almost fully recovered.  He has since been started on aspirin 81 mg by mouth daily.  He continues to take his Crestor.     Past Medical History:  Diagnosis Date   Atrial flutter with rapid ventricular response (HCC) 07/23/2018   CAD (coronary artery disease)    a. s/p CABG 2003  b. s/p acute MI prior to CABG w/ EF <30%   Cardiomyopathy, ischemic: EF 20-25%    Cholecystitis    Chronic combined systolic and diastolic CHF (congestive heart failure) (HCC)    Chronic systolic CHF (congestive heart failure) (HCC)    a. 2D ECHO: 05/17/2014: EF 30-35%;  b. 07/2015 TEE: EF 20-25%, diff HK, sev MR, sev dil LA w/o thrombus, mod reduced RV fxn, mod dil RA, mod TR.   Hyperlipidemia    Hypertensive heart disease    Kidney stones X 1   "passed it" (08/02/2015)   OSA (obstructive sleep apnea) 10/11/2014   refuses to use CPAP   Persistent atrial fibrillation (HCC)    a. s/p succesful TEE/DCCV on 06/01/14.  b. on Eliquis;  c. 07/2015 recurrent AF-->Failed DCCV.   S/P CABG x 5 02/16/2002   LIMA to LAD, SVG to D1, SVG to OM2, Sequential SVG to PDA-RPL, saphenous vein harvest from  right thigh and lower leg   Severe mitral regurgitation    Functional, improved with improvement in his EF.   Stroke (HCC) 01/12/2021   Tricuspid regurgitation     Past Surgical History:  Procedure Laterality Date   ATRIAL FIBRILLATION ABLATION N/A 04/11/2020   Procedure: ATRIAL FIBRILLATION ABLATION;  Surgeon: Lanier Prude, MD;  Location: [facility] INVASIVE CV LAB;  Service: Cardiovascular;  Laterality: N/A;   CARDIAC CATHETERIZATION N/A 06/19/2014   Procedure: RIGHT/LEFT HEART CATH AND CORONARY/GRAFT ANGIOGRAPHY;  Surgeon: Lennette Bihari, MD;  Location: [facility] CATH LAB;  Service: Cardiovascular;  Laterality: N/A;   CARDIAC CATHETERIZATION  2003   CARDIAC CATHETERIZATION N/A 08/28/2015   Procedure: Right Heart Cath and Coronary/Graft Angiography;  Surgeon: Laurey Morale, MD;  Location: [facility] INVASIVE CV LAB;  Service: Cardiovascular;  Laterality: N/A;   CARDIOVERSION N/A 06/01/2014   Procedure: CARDIOVERSION;  Surgeon: Lars Masson, MD;  Location: [facility] ENDOSCOPY;  Service: Cardiovascular;  Laterality: N/A;   CARDIOVERSION  06/11/2015; 08/02/2015   CARDIOVERSION N/A 08/02/2015   Procedure: CARDIOVERSION;  Surgeon: Wendall Stade, MD;  Location: [facility] ENDOSCOPY;  Service: Cardiovascular;  Laterality: N/A;   CARDIOVERSION N/A 10/22/2015   Procedure: CARDIOVERSION;  Surgeon: Laurey Morale, MD;  Location: [facility] ENDOSCOPY;  Service: Cardiovascular;  Laterality: N/A;   CARDIOVERSION N/A 03/15/2020  Procedure: CARDIOVERSION;  Surgeon: Laurey Morale, MD;  Location: [facility] ENDOSCOPY;  Service: Cardiovascular;  Laterality: N/A;   CARDIOVERSION N/A 05/14/2020   Procedure: CARDIOVERSION;  Surgeon: [name], MD;  Location: [facility] ENDOSCOPY;  Service: Cardiovascular;  Laterality: N/A;   CORONARY ARTERY BYPASS GRAFT  02/16/2002   CABG X5   LAPAROSCOPIC APPENDECTOMY N/A 07/20/2016   Procedure: APPENDECTOMY LAPAROSCOPIC;  Surgeon: Manus Rudd, MD;  Location: [facility] OR;  Service: General;  Laterality: N/A;   TEE  WITHOUT CARDIOVERSION N/A 06/01/2014   Procedure: TRANSESOPHAGEAL ECHOCARDIOGRAM (TEE);  Surgeon: Lars Masson, MD;  Location: [facility] ENDOSCOPY;  Service: Cardiovascular;  Laterality: N/A;   TEE WITHOUT CARDIOVERSION N/A 08/03/2015   Procedure: TRANSESOPHAGEAL ECHOCARDIOGRAM (TEE);  Surgeon: Lars Masson, MD;  Location: [facility] ENDOSCOPY;  Service: Cardiovascular;  Laterality: N/A;    Current Medications: Current Meds  Medication Sig   acetaminophen (TYLENOL) 325 MG tablet Take 1-2 tablets (325-650 mg total) by mouth every 4 (four) hours as needed for mild pain.   apixaban (ELIQUIS) 5 MG TABS tablet TAKE 1 TABLET(5 MG) BY MOUTH TWICE DAILY   aspirin EC 81 MG EC tablet Take 1 tablet (81 mg total) by mouth daily. Swallow whole.   ENTRESTO 97-103 MG TAKE 1 TABLET BY MOUTH TWICE DAILY   furosemide (LASIX) 40 MG tablet Take 1 tablet (40 mg total) by mouth as needed.   isosorbide mononitrate (IMDUR) 30 MG 24 hr tablet TAKE 1 TABLET(30 MG) BY MOUTH DAILY   metoprolol succinate (TOPROL-XL) 50 MG 24 hr tablet Take 1 tablet (50 mg total) by mouth 2 (two) times daily. Take with or immediately following a meal.   rosuvastatin (CRESTOR) 40 MG tablet TAKE 1 TABLET(40 MG) BY MOUTH DAILY     Allergies:   Patient has no known allergies.   Social History   Socioeconomic History   Marital status: Married    Spouse name: Not on file   Number of children: Not on file   Years of education: Not on file   Highest education level: Not on file  Occupational History   Occupation: Retired  Tobacco Use   Smoking status: Former    Packs/day: 1.00    Years: 40.00    Pack years: 40.00    Types: Cigarettes    Quit date: 02/02/2002    Years since quitting: 19.1   Smokeless tobacco: Never  Substance and Sexual Activity   Alcohol use: Yes    Comment: 08/02/2015 "I rarely have a beer"   Drug use: No   Sexual activity: Yes  Other Topics Concern   Not on file  Social History Narrative   Lives in [place]  with wife.   Social Determinants of Health   Financial Resource Strain: Not on file  Food Insecurity: Not on file  Transportation Needs: Not on file  Physical Activity: Not on file  Stress: Not on file  Social Connections: Not on file     Family History: The patient's family history includes Cancer in his father and mother. There is no history of Hypertension or Stroke.  ROS:   Please see the history of present illness.    All other systems reviewed and are negative.  EKGs/Labs/Other Studies Reviewed:    The following studies were reviewed today: Hospitalization records   January 18, 2021 echo personally reviewed Left ventricular function normal, 60% Right ventricular function normal No significant valvular abnormalities   EKG:  The ekg ordered today demonstrates sinus rhythm  Recent Labs: 04/17/2020:  B Natriuretic Peptide 174.9 01/20/2021: ALT 30; BUN 20; Creatinine, Ser 1.31; Hemoglobin 14.4; Platelets 197; Potassium 3.7; Sodium 138  Recent Lipid Panel    Component Value Date/Time   CHOL 107 01/17/2021 0344   TRIG 167 (H) 01/17/2021 0344   HDL 33 (L) 01/17/2021 0344   CHOLHDL 3.2 01/17/2021 0344   VLDL 33 01/17/2021 0344   LDLCALC 41 01/17/2021 0344    Physical Exam:    VS:  BP 118/88   Pulse (!) 57   Ht 5\' 11"  (1.803 m)   Wt 217 lb 9.6 oz (98.7 kg)   SpO2 97%   BMI 30.35 kg/m     Wt Readings from Last 3 Encounters:  03/12/21 217 lb 9.6 oz (98.7 kg)  01/24/21 223 lb 15.8 oz (101.6 kg)  01/18/21 226 lb 6.6 oz (102.7 kg)     GEN:  Well nourished, well developed in no acute distress HEENT: Normal NECK: No JVD; No carotid bruits LYMPHATICS: No lymphadenopathy CARDIAC: RRR, no murmurs, rubs, gallops RESPIRATORY:  Clear to auscultation without rales, wheezing or rhonchi  ABDOMEN: Soft, non-tender, non-distended MUSCULOSKELETAL:  No edema; No deformity  SKIN: Warm and dry NEUROLOGIC:  Alert and oriented x 3 PSYCHIATRIC:  Normal affect   ASSESSMENT:     1. PAF (paroxysmal atrial fibrillation) (HCC)   2. Atrial flutter with rapid ventricular response (HCC)   3. Chronic combined systolic and diastolic CHF (congestive heart failure) (HCC)   4. Cerebrovascular accident (CVA), unspecified mechanism (HCC)    PLAN:    In order of problems listed above:   1. PAF (paroxysmal atrial fibrillation) (HCC) 2. Atrial flutter with rapid ventricular response (HCC)  Maintaining normal rhythm after his ablation.  Continue Eliquis for secondary stroke prophylaxis in addition aspirin 81 mg by mouth daily.   3. Chronic combined systolic and diastolic CHF (congestive heart failure) (HCC) NYHA class I-II today.  Warm and dry.  His ejection fraction has normalized on the most recent echocardiogram which is excellent news.  Rhythm control is indicated.  Continue Lasix Imdur, metoprolol and Entresto.  4. Cerebrovascular accident (CVA), unspecified mechanism (HCC) Continue aspirin, Eliquis, Crestor.    Follow-up 12 months or sooner as needed.  Okay to follow-up with NP/PA.      Medication Adjustments/Labs and Tests Ordered: Current medicines are reviewed at length with the patient today.  Concerns regarding medicines are outlined above.  No orders of the defined types were placed in this encounter.  No orders of the defined types were placed in this encounter.    Signed, 01/20/21, MD, [facility], [facility] 03/12/2021 10:52 AM    Electrophysiology [facility]

## 2021-03-12 NOTE — Patient Instructions (Addendum)
Medication Instructions:  °Your physician recommends that you continue on your current medications as directed. Please refer to the Current Medication list given to you today. °*If you need a refill on your cardiac medications before your next appointment, please call your pharmacy* ° °Lab Work: °None ordered. °If you have labs (blood work) drawn today and your tests are completely normal, you will receive your results only by: °MyChart Message (if you have MyChart) OR °A paper copy in the mail °If you have any lab test that is abnormal or we need to change your treatment, we will call you to review the results. ° °Testing/Procedures: °None ordered. ° °Follow-Up: °At [facility], you and your health needs are our priority.  As part of our continuing mission to provide you with exceptional heart care, we have created designated Provider Care Teams.  These Care Teams include your primary Cardiologist (physician) and Advanced Practice Providers (APPs -  Physician Assistants and Nurse Practitioners) who all work together to provide you with the care you need, when you need it. ° °Your next appointment:   °Your physician wants you to follow-up in: one year with one of the following Advanced Practice Providers on your designated Care Team:   °Renee Ursuy, PA-C °Michael "Andy" Tillery, PA-C °You will receive a reminder letter in the mail two months in advance. If you don't receive a letter, please call our office to schedule the follow-up appointment. ° °

## 2021-03-15 NOTE — Addendum Note (Signed)
Addended by: Michaelle Copas on: 03/15/2021 01:57 PM   Modules accepted: Orders

## 2021-03-18 DIAGNOSIS — R531 Weakness: Secondary | ICD-10-CM | POA: Diagnosis not present

## 2021-03-18 DIAGNOSIS — I639 Cerebral infarction, unspecified: Secondary | ICD-10-CM | POA: Diagnosis not present

## 2021-03-18 DIAGNOSIS — R2689 Other abnormalities of gait and mobility: Secondary | ICD-10-CM | POA: Diagnosis not present

## 2021-03-20 ENCOUNTER — Encounter: Payer: Self-pay | Admitting: Adult Health

## 2021-03-20 ENCOUNTER — Ambulatory Visit (INDEPENDENT_AMBULATORY_CARE_PROVIDER_SITE_OTHER): Payer: Medicare Other | Admitting: Adult Health

## 2021-03-20 ENCOUNTER — Other Ambulatory Visit: Payer: Self-pay

## 2021-03-20 VITALS — BP 120/81 | HR 74 | Ht 71.0 in | Wt 216.0 lb

## 2021-03-20 DIAGNOSIS — I480 Paroxysmal atrial fibrillation: Secondary | ICD-10-CM | POA: Diagnosis not present

## 2021-03-20 DIAGNOSIS — E785 Hyperlipidemia, unspecified: Secondary | ICD-10-CM

## 2021-03-20 DIAGNOSIS — R269 Unspecified abnormalities of gait and mobility: Secondary | ICD-10-CM

## 2021-03-20 DIAGNOSIS — I639 Cerebral infarction, unspecified: Secondary | ICD-10-CM

## 2021-03-20 DIAGNOSIS — I10 Essential (primary) hypertension: Secondary | ICD-10-CM

## 2021-03-20 DIAGNOSIS — I69398 Other sequelae of cerebral infarction: Secondary | ICD-10-CM

## 2021-03-20 NOTE — Progress Notes (Signed)
[facility] [address] [place]. [place] [address] [phone]       HOSPITAL FOLLOW UP NOTE  [name] Date of Birth:  [birth year] Medical Record Number:  [id number]   Reason for Referral:  hospital stroke follow up    SUBJECTIVE:   CHIEF COMPLAINT:  Chief Complaint  Patient presents with   Stroke    Rm 2 hospital FU, wife- Stanley Wells "currently in therapy, doing well"    HPI:   [name] is a 75 y.o. male with history of afib on eliquis, CAD s/p CABG 2003, CHF, HTN and HLD admitted on 01/16/2021 for left sided weakness for 6 days.  Evaluated by Dr. Roda Shutters for right BG/PLIC infarct secondary to small vessel disease source.  Evidence of old strokes on imaging including cerebellar and vermis.  CTA head/neck no LVO.  EF 60 to 65%.  LDL 41.  A1c 6.7.  UDS negative.  On Eliquis PTA recommended adding aspirin 81 mg daily for further stroke prevention.  HTN stable.  Resumed home Crestor for HLD management.  Other stroke risk factors include prior strokes on imaging, advanced age, obesity and CAD s/p CABG 2003.  PT/OT evaluated for unsteady gait with balance deficits, gait instability and poor awareness of deficits.  CIR recommended for functional decline.  Today, 03/20/2021, Stanley Wells is being seen for hospital follow-up accompanied by his wife.  Overall doing well.  Currently working with [facility] for residual gait impairment and difficulty controlling LLE but overall greatly improving.  Ambulating without assistive device -no recent falls.  Denies new stroke/TIA symptoms.  Remains on aspirin and Eliquis as well as Crestor without associated side effects.  Blood pressure today 120/81.  He has had follow-up with PCP since discharge -plans to repeat A1c in October at follow-up visit.  Has had follow-up with Dr. Lalla Brothers for PAF and plans on follow-up with Dr. Shirlee Latch for CHF and CHD in October.  No further concerns at this time.     Pertinent  imaging  MR BRAIN 01/16/2021 IMPRESSION: 1 cm acute infarction at the posterior basal ganglia/posterior limb internal capsule on the right.   Extensive chronic small-vessel ischemic changes elsewhere throughout the brain as described above, several showing hemosiderin deposition related to old microhemorrhage.   CTA HEAD/NECK 01/17/2021 IMPRESSION: 1. No intracranial arterial occlusion or high-grade stenosis. 2. Bilateral carotid bifurcation atherosclerosis without hemodynamically significant stenosis by NASCET criteria.   ECHOCARDIOGRAM 01/18/2021 IMPRESSIONS   1. Left ventricular ejection fraction, by estimation, is 60 to 65%. The  left ventricle has normal function. The left ventricle has no regional  wall motion abnormalities. There is severe concentric left ventricular  hypertrophy. Left ventricular diastolic   parameters are indeterminate.   2. Right ventricular systolic function is normal. The right ventricular  size is normal.   3. Left atrial size was mildly dilated.   4. Right atrial size was moderately dilated.   5. The mitral valve is normal in structure. No evidence of mitral valve  regurgitation. No evidence of mitral stenosis.   6. The aortic valve is normal in structure. Aortic valve regurgitation is  not visualized. No aortic stenosis is present.   7. Aortic dilatation noted.     ROS:   14 system review of systems performed and negative with exception of those listed in HPI  PMH:  Past Medical History:  Diagnosis Date   Atrial flutter with rapid ventricular response (HCC) 07/23/2018   CAD (coronary artery disease)  a. s/p CABG 2003  b. s/p acute MI prior to CABG w/ EF <30%   Cardiomyopathy, ischemic: EF 20-25%    Cholecystitis    Chronic combined systolic and diastolic CHF (congestive heart failure) (HCC)    Chronic systolic CHF (congestive heart failure) (HCC)    a. 2D ECHO: 05/17/2014: EF 30-35%;  b. 07/2015 TEE: EF 20-25%, diff HK, sev MR, sev  dil LA w/o thrombus, mod reduced RV fxn, mod dil RA, mod TR.   Hyperlipidemia    Hypertensive heart disease    Kidney stones X 1   "passed it" (08/02/2015)   OSA (obstructive sleep apnea) 10/11/2014   refuses to use CPAP   Persistent atrial fibrillation (HCC)    a. s/p succesful TEE/DCCV on 06/01/14.  b. on Eliquis;  c. 07/2015 recurrent AF-->Failed DCCV.   S/P CABG x 5 02/16/2002   LIMA to LAD, SVG to D1, SVG to OM2, Sequential SVG to PDA-RPL, saphenous vein harvest from right thigh and lower leg   Severe mitral regurgitation    Functional, improved with improvement in his EF.   Stroke (HCC) 01/12/2021   Tricuspid regurgitation     PSH:  Past Surgical History:  Procedure Laterality Date   ATRIAL FIBRILLATION ABLATION N/A 04/11/2020   Procedure: ATRIAL FIBRILLATION ABLATION;  Surgeon: Lanier Prude, MD;  Location: [facility] INVASIVE CV LAB;  Service: Cardiovascular;  Laterality: N/A;   CARDIAC CATHETERIZATION N/A 06/19/2014   Procedure: RIGHT/LEFT HEART CATH AND CORONARY/GRAFT ANGIOGRAPHY;  Surgeon: Lennette Bihari, MD;  Location: [facility] CATH LAB;  Service: Cardiovascular;  Laterality: N/A;   CARDIAC CATHETERIZATION  2003   CARDIAC CATHETERIZATION N/A 08/28/2015   Procedure: Right Heart Cath and Coronary/Graft Angiography;  Surgeon: Laurey Morale, MD;  Location: [facility] INVASIVE CV LAB;  Service: Cardiovascular;  Laterality: N/A;   CARDIOVERSION N/A 06/01/2014   Procedure: CARDIOVERSION;  Surgeon: Lars Masson, MD;  Location: [facility] ENDOSCOPY;  Service: Cardiovascular;  Laterality: N/A;   CARDIOVERSION  06/11/2015; 08/02/2015   CARDIOVERSION N/A 08/02/2015   Procedure: CARDIOVERSION;  Surgeon: Wendall Stade, MD;  Location: [facility] ENDOSCOPY;  Service: Cardiovascular;  Laterality: N/A;   CARDIOVERSION N/A 10/22/2015   Procedure: CARDIOVERSION;  Surgeon: Laurey Morale, MD;  Location: [facility] ENDOSCOPY;  Service: Cardiovascular;  Laterality: N/A;   CARDIOVERSION N/A 03/15/2020   Procedure: CARDIOVERSION;   Surgeon: Laurey Morale, MD;  Location[facility] ENDOSCOPY;  Service: Cardiovascular;  Laterality: N/A;   CARDIOVERSION N/A 05/14/2020   Procedure: CARDIOVERSION;  Surgeon: [name], MD;  Location: [facility] ENDOSCOPY;  Service: Cardiovascular;  Laterality: N/A;   CORONARY ARTERY BYPASS GRAFT  02/16/2002   CABG X5   LAPAROSCOPIC APPENDECTOMY N/A 07/20/2016   Procedure: APPENDECTOMY LAPAROSCOPIC;  Surgeon: Manus Rudd, MD;  Location: [facility] OR;  Service: General;  Laterality: N/A;   TEE WITHOUT CARDIOVERSION N/A 06/01/2014   Procedure: TRANSESOPHAGEAL ECHOCARDIOGRAM (TEE);  Surgeon: Lars Masson, MD;  Location: [facility] ENDOSCOPY;  Service: Cardiovascular;  Laterality: N/A;   TEE WITHOUT CARDIOVERSION N/A 08/03/2015   Procedure: TRANSESOPHAGEAL ECHOCARDIOGRAM (TEE);  Surgeon: Lars Masson, MD;  Location: [facility] ENDOSCOPY;  Service: Cardiovascular;  Laterality: N/A;    Social History:  Social History   Socioeconomic History   Marital status: Married    Spouse name: Stanley Wells   Number of children: 2   Years of education: 12   Highest education level: Not on file  Occupational History   Occupation: Retired  Tobacco Use   Smoking status: Some Days  Years: 40.00    Types: Cigarettes    Last attempt to quit: 02/02/2002    Years since quitting: 19.1   Smokeless tobacco: Never  Substance and Sexual Activity   Alcohol use: Yes    Comment: 08/02/2015 "I rarely have a beer"   Drug use: No   Sexual activity: Yes  Other Topics Concern   Not on file  Social History Narrative   Lives in [place] with wife.   Caffeine- not daily   Social Determinants of Health   Financial Resource Strain: Not on file  Food Insecurity: Not on file  Transportation Needs: Not on file  Physical Activity: Not on file  Stress: Not on file  Social Connections: Not on file  Intimate Partner Violence: Not on file    Family History:  Family History  Problem Relation Age of Onset   Cancer Mother    Cancer Father     Hypertension Neg Hx    Stroke Neg Hx     Medications:   Current Outpatient Medications on File Prior to Visit  Medication Sig Dispense Refill   acetaminophen (TYLENOL) 325 MG tablet Take 1-2 tablets (325-650 mg total) by mouth every 4 (four) hours as needed for mild pain.     apixaban (ELIQUIS) 5 MG TABS tablet TAKE 1 TABLET(5 MG) BY MOUTH TWICE DAILY 180 tablet 3   aspirin EC 81 MG EC tablet Take 1 tablet (81 mg total) by mouth daily. Swallow whole. 30 tablet 11   ENTRESTO 97-103 MG TAKE 1 TABLET BY MOUTH TWICE DAILY 60 tablet 11   furosemide (LASIX) 40 MG tablet Take 1 tablet (40 mg total) by mouth as needed. 30 tablet 11   isosorbide mononitrate (IMDUR) 30 MG 24 hr tablet TAKE 1 TABLET(30 MG) BY MOUTH DAILY 90 tablet 1   metoprolol succinate (TOPROL-XL) 50 MG 24 hr tablet Take 1 tablet (50 mg total) by mouth 2 (two) times daily. Take with or immediately following a meal. 270 tablet 3   rosuvastatin (CRESTOR) 40 MG tablet TAKE 1 TABLET(40 MG) BY MOUTH DAILY 90 tablet 3   No current facility-administered medications on file prior to visit.    Allergies:  No Known Allergies    OBJECTIVE:  Physical Exam  Vitals:   03/20/21 1108  BP: 120/81  Pulse: 74  Weight: 216 lb (98 kg)  Height: 5\' 11"  (1.803 m)   Body mass index is 30.13 kg/m. No results found.  Post stroke PHQ 2/9 Depression screen PHQ 2/9 03/20/2021  Decreased Interest 0  Down, Depressed, Hopeless 0  PHQ - 2 Score 0     General: well developed, well nourished, very pleasant elderly Caucasian male, seated, in no evident distress Head: head normocephalic and atraumatic.   Neck: supple with no carotid or supraclavicular bruits Cardiovascular: regular rate and rhythm, no murmurs Musculoskeletal: no deformity Skin:  no rash/petichiae Vascular:  Normal pulses all extremities   Neurologic Exam Mental Status: Awake and fully alert.  Fluent speech and language.  Oriented to place and time. Recent and remote memory  intact. Attention span, concentration and fund of knowledge appropriate. Mood and affect appropriate.  Cranial Nerves: Fundoscopic exam reveals sharp disc margins. Pupils equal, briskly reactive to light. Extraocular movements full without nystagmus. Visual fields full to confrontation. Hearing intact. Facial sensation intact. Face, tongue, palate moves normally and symmetrically.  Motor: Normal bulk and tone. Normal strength in all tested extremity muscles Sensory.: intact to touch , pinprick , position and vibratory sensation.  Coordination: Rapid alternating movements normal in all extremities. Finger-to-nose and heel-to-shin mild left-sided dysmetria Gait and Station: Arises from chair without difficulty. Stance is normal. Gait demonstrates slightly decreased stride length and step height LLE with mild unsteadiness without use of assistive device.  Tandem walk and heel toe mild to moderate difficulty.  Reflexes: 1+ and symmetric. Toes downgoing.     NIHSS  1 Modified Rankin  2      ASSESSMENT: Stanley Wells is a 75 y.o. year old male with recent right BG/IC infarct secondary to small vessel disease on 01/16/2021 after presenting with left-sided weakness for 6 days (on a camping trip in [place]).  Vascular risk factors include A. fib on Eliquis, prior strokes on imaging, HTN, HLD, CAD s/p CABG and CHF.      PLAN:  R BG/IC infarct:  Residual deficit: Gait impairment and left-sided dysmetria.  Continue PT for likely further improvement Continue aspirin 81 mg daily and Eliquis (apixaban) daily  and Crestor 40 mg daily for secondary stroke prevention.   Discussed secondary stroke prevention measures and importance of close PCP follow up for aggressive stroke risk factor management. I have gone over the pathophysiology of stroke, warning signs and symptoms, risk factors and their management in some detail with instructions to go to the closest emergency room for symptoms of concern. Atrial  fibrillation: s/p ablation and cardioversion.  On Eliquis 5 mg twice daily monitored by cardiology HTN: BP goal <130/90.  Stable on current regimen per PCP HLD: LDL goal <70. Recent LDL 41 on Crestor 40 mg daily.  Carotid stenosis: per CTA 01/17/2021, bilateral carotid bifurcation atherosclerosis <50% stenosis. Would recommend CUS in 6 months for surveillance monitoring which can be done with cardiology or will order at f/u visit if not yet completed     Follow up in 6 months or call earlier if needed   CC:  [facility] provider: Dr. Pearlean BrownieSethi PCP: Noni Saupeedding, [name], MD    I spent 47 minutes of face-to-face and non-face-to-face time with patient and wife.  This included previsit chart review including review of recent hospitalization, lab review, study review, electronic health record documentation, patient education regarding recent stroke and etiology, residual deficits and typical recovery time, secondary stroke prevention measures and importance of managing stroke risk factors and answered all other questions to patient and wife's satisfaction   Ihor AustinJessica McCue, AGNP-BC  [facility] [address] [place], [place] [id number]  Phone [phone] Fax [phone] Note: This document was prepared with digital dictation and possible smart phrase technology. Any transcriptional errors that result from this process are unintentional.

## 2021-03-20 NOTE — Patient Instructions (Signed)
Continue aspirin 81 mg daily and Eliquis (apixaban) daily  and Crestor  for secondary stroke prevention  Continue to follow up with PCP regarding cholesterol and blood pressure management and monitoring of diabetic levels Maintain strict control of hypertension with blood pressure goal below 130/90, diabetes with hemoglobin A1c goal below 6.5% and cholesterol with LDL cholesterol (bad cholesterol) goal below 70 mg/dL.   Follow up with cardiology for atrial fibrillation as well as monitoring of carotid arteries     Followup in the future with me in 6 months or call earlier if needed       Thank you for coming to see [place] at [facility]. I hope we have been able to provide you high quality care today.  You may receive a patient satisfaction survey over the next few weeks. We would appreciate your feedback and comments so that we may continue to improve ourselves and the health of our patients.

## 2021-03-26 NOTE — Progress Notes (Signed)
I agree with the above plan 

## 2021-03-27 DIAGNOSIS — R531 Weakness: Secondary | ICD-10-CM | POA: Diagnosis not present

## 2021-03-27 DIAGNOSIS — R2689 Other abnormalities of gait and mobility: Secondary | ICD-10-CM | POA: Diagnosis not present

## 2021-03-27 DIAGNOSIS — I639 Cerebral infarction, unspecified: Secondary | ICD-10-CM | POA: Diagnosis not present

## 2021-03-29 ENCOUNTER — Other Ambulatory Visit (HOSPITAL_COMMUNITY): Payer: Self-pay

## 2021-03-29 MED ORDER — ENTRESTO 97-103 MG PO TABS: 1.0000 | ORAL_TABLET | Freq: Two times a day (BID) | ORAL | 11 refills | Status: DC

## 2021-04-03 DIAGNOSIS — R2689 Other abnormalities of gait and mobility: Secondary | ICD-10-CM | POA: Diagnosis not present

## 2021-04-03 DIAGNOSIS — I639 Cerebral infarction, unspecified: Secondary | ICD-10-CM | POA: Diagnosis not present

## 2021-04-03 DIAGNOSIS — R531 Weakness: Secondary | ICD-10-CM | POA: Diagnosis not present

## 2021-04-05 ENCOUNTER — Other Ambulatory Visit: Payer: Self-pay

## 2021-04-05 ENCOUNTER — Encounter: Payer: Self-pay | Admitting: Physical Medicine and Rehabilitation

## 2021-04-05 ENCOUNTER — Encounter
Payer: Medicare Other | Attending: Physical Medicine and Rehabilitation | Admitting: Physical Medicine and Rehabilitation

## 2021-04-05 VITALS — BP 159/88 | HR 67 | Temp 98.0°F | Ht 71.0 in | Wt 217.4 lb

## 2021-04-05 DIAGNOSIS — I480 Paroxysmal atrial fibrillation: Secondary | ICD-10-CM | POA: Insufficient documentation

## 2021-04-05 DIAGNOSIS — E119 Type 2 diabetes mellitus without complications: Secondary | ICD-10-CM | POA: Insufficient documentation

## 2021-04-05 DIAGNOSIS — I639 Cerebral infarction, unspecified: Secondary | ICD-10-CM | POA: Diagnosis not present

## 2021-04-05 DIAGNOSIS — I110 Hypertensive heart disease with heart failure: Secondary | ICD-10-CM | POA: Diagnosis not present

## 2021-04-05 NOTE — Progress Notes (Signed)
Subjective:    Patient ID: Stanley Wells, male    DOB: November 27, 1945, 75 y.o.   MRN: [id number]  HPI Stanley Wells is a 75 year old man who presents to for follow-up after CIR admission for subcortical infarction.   1) HTN -he does not check his BP at home -it is elevated to 159/88 today  2) DM Type 2 -he is aware that he was newly diagnosed with type 2 DM while in the hospital.  3) Impaired mobility following subcortical infarction -he has received PT and OT  -ambulating 25-30 minutes regularly -no other complaints or concerns -Denies pain -Does not require refills.  -Has f/u with PCP in October  Pain Inventory Average Pain 0 Pain Right Now 0 My pain is  no pain    BOWEL Number of stools per week: 14 Oral laxative use No    BLADDER Normal   Mobility walk without assistance how many minutes can you walk? 25 ability to climb steps?  yes do you drive?  yes  Function retired  Neuro/Psych weakness confusion  Prior Studies Any changes since last visit?  no  Physicians involved in your care Any changes since last visit?  no   Family History  Problem Relation Age of Onset   Cancer Mother    Cancer Father    Hypertension Neg Hx    Stroke Neg Hx    Social History   Socioeconomic History   Marital status: Married    Spouse name: [name]   Number of children: 2   Years of education: 12   Highest education level: Not on file  Occupational History   Occupation: Retired  Tobacco Use   Smoking status: Some Days    Years: 40.00    Types: Cigarettes    Last attempt to quit: 02/02/2002    Years since quitting: 19.1   Smokeless tobacco: Never  Substance and Sexual Activity   Alcohol use: Yes    Comment: 08/02/2015 "I rarely have a beer"   Drug use: No   Sexual activity: Yes  Other Topics Concern   Not on file  Social History Narrative   Lives in [place] with wife.   Caffeine- not daily   Social Determinants of Health   Financial Resource  Strain: Not on file  Food Insecurity: Not on file  Transportation Needs: Not on file  Physical Activity: Not on file  Stress: Not on file  Social Connections: Not on file   Past Surgical History:  Procedure Laterality Date   ATRIAL FIBRILLATION ABLATION N/A 04/11/2020   Procedure: ATRIAL FIBRILLATION ABLATION;  Surgeon: Lanier Prude, MD;  Location: [facility] INVASIVE CV LAB;  Service: Cardiovascular;  Laterality: N/A;   CARDIAC CATHETERIZATION N/A 06/19/2014   Procedure: RIGHT/LEFT HEART CATH AND CORONARY/GRAFT ANGIOGRAPHY;  Surgeon: Lennette Bihari, MD;  Location: [facility] CATH LAB;  Service: Cardiovascular;  Laterality: N/A;   CARDIAC CATHETERIZATION  2003   CARDIAC CATHETERIZATION N/A 08/28/2015   Procedure: Right Heart Cath and Coronary/Graft Angiography;  Surgeon: Laurey Morale, MD;  Location: [facility] INVASIVE CV LAB;  Service: Cardiovascular;  Laterality: N/A;   CARDIOVERSION N/A 06/01/2014   Procedure: CARDIOVERSION;  Surgeon: Lars Masson, MD;  Location: [facility] ENDOSCOPY;  Service: Cardiovascular;  Laterality: N/A;   CARDIOVERSION  06/11/2015; 08/02/2015   CARDIOVERSION N/A 08/02/2015   Procedure: CARDIOVERSION;  Surgeon: Wendall Stade, MD;  Location: [facility] ENDOSCOPY;  Service: Cardiovascular;  Laterality: N/A;   CARDIOVERSION N/A 10/22/2015   Procedure: CARDIOVERSION;  Surgeon: Laurey Morale, MD;  Location: [facility] ENDOSCOPY;  Service: Cardiovascular;  Laterality: N/A;   CARDIOVERSION N/A 03/15/2020   Procedure: CARDIOVERSION;  Surgeon: Laurey Morale, MD;  Location: [facility] ENDOSCOPY;  Service: Cardiovascular;  Laterality: N/A;   CARDIOVERSION N/A 05/14/2020   Procedure: CARDIOVERSION;  Surgeon: [name], MD;  Location: [facility] ENDOSCOPY;  Service: Cardiovascular;  Laterality: N/A;   CORONARY ARTERY BYPASS GRAFT  02/16/2002   CABG X5   LAPAROSCOPIC APPENDECTOMY N/A 07/20/2016   Procedure: APPENDECTOMY LAPAROSCOPIC;  Surgeon: Manus Rudd, MD;  Location: [facility] OR;  Service: General;  Laterality: N/A;    TEE WITHOUT CARDIOVERSION N/A 06/01/2014   Procedure: TRANSESOPHAGEAL ECHOCARDIOGRAM (TEE);  Surgeon: Lars Masson, MD;  Location: [facility] ENDOSCOPY;  Service: Cardiovascular;  Laterality: N/A;   TEE WITHOUT CARDIOVERSION N/A 08/03/2015   Procedure: TRANSESOPHAGEAL ECHOCARDIOGRAM (TEE);  Surgeon: Lars Masson, MD;  Location: [facility] ENDOSCOPY;  Service: Cardiovascular;  Laterality: N/A;   Past Medical History:  Diagnosis Date   Atrial flutter with rapid ventricular response (HCC) 07/23/2018   CAD (coronary artery disease)    a. s/p CABG 2003  b. s/p acute MI prior to CABG w/ EF <30%   Cardiomyopathy, ischemic: EF 20-25%    Cholecystitis    Chronic combined systolic and diastolic CHF (congestive heart failure) (HCC)    Chronic systolic CHF (congestive heart failure) (HCC)    a. 2D ECHO: 05/17/2014: EF 30-35%;  b. 07/2015 TEE: EF 20-25%, diff HK, sev MR, sev dil LA w/o thrombus, mod reduced RV fxn, mod dil RA, mod TR.   Hyperlipidemia    Hypertensive heart disease    Kidney stones X 1   "passed it" (08/02/2015)   OSA (obstructive sleep apnea) 10/11/2014   refuses to use CPAP   Persistent atrial fibrillation (HCC)    a. s/p succesful TEE/DCCV on 06/01/14.  b. on Eliquis;  c. 07/2015 recurrent AF-->Failed DCCV.   S/P CABG x 5 02/16/2002   LIMA to LAD, SVG to D1, SVG to OM2, Sequential SVG to PDA-RPL, saphenous vein harvest from right thigh and lower leg   Severe mitral regurgitation    Functional, improved with improvement in his EF.   Stroke (HCC) 01/12/2021   Tricuspid regurgitation    BP (!) 159/88   Pulse 67   Temp 98 F (36.7 C)   Ht 5\' 11"  (1.803 m)   Wt 217 lb 6.4 oz (98.6 kg)   SpO2 96%   BMI 30.32 kg/m   Opioid Risk Score:   Fall Risk Score:  `1  Depression screen PHQ 2/9  Depression screen [facility] 2/9 04/05/2021 03/20/2021 11/08/2015  Decreased Interest 0 0 0  Down, Depressed, Hopeless 0 0 0  PHQ - 2 Score 0 0 0  Altered sleeping 1 - -  Tired, decreased energy 1 - -   Change in appetite 0 - -  Feeling bad or failure about yourself  0 - -  Trouble concentrating 0 - -  Moving slowly or fidgety/restless 0 - -  Suicidal thoughts 0 - -  PHQ-9 Score 2 - -     Review of Systems  Constitutional: Negative.   HENT: Negative.    Eyes: Negative.   Respiratory: Negative.    Cardiovascular: Negative.   Gastrointestinal: Negative.   Endocrine: Negative.   Genitourinary: Negative.   Musculoskeletal: Negative.   Skin: Negative.   Allergic/Immunologic: Negative.   Neurological: Negative.   Hematological:  Bruises/bleeds easily.       Eliquis  Psychiatric/Behavioral:  Positive for confusion.   All other systems reviewed and are negative.     Objective:   Physical Exam Gen: no distress, normal appearing HEENT: oral mucosa pink and moist, NCAT Cardio: Reg rate Chest: normal effort, normal rate of breathing Abd: soft, non-distended Ext: no edema Psych: pleasant, normal affect Skin: intact Neuro: Alert and oriented x3 Musculoskeletal: Walking steadily without assistive device    Assessment & Plan:   1) impaired mobility secondary to subcortical infarction -may be discharged from PT and OT -encouraged daily 30 minute walk after meal  2) HTN: -BP is 159/88 today.  -Advised checking BP daily at home and logging results to bring into follow-up appointment with her PCP and myself. -Reviewed BP meds today.  -Advised regarding healthy foods that can help lower blood pressure and provided with a list: 1) citrus foods- high in vitamins and minerals 2) salmon and other fatty fish - reduces inflammation and oxylipins 3) swiss chard (leafy green)- high level of nitrates 4) pumpkin seeds- one of the best natural sources of magnesium 5) Beans and lentils- high in fiber, magnesium, and potassium 6) Berries- high in flavonoids 7) Amaranth (whole grain, can be cooked similarly to rice and oats)- high in magnesium and fiber 8) Pistachios- even more effective at  reducing BP than other nuts 9) Carrots- high in phenolic compounds that relax blood vessels and reduce inflammation 10) Celery- contain phthalides that relax tissues of arterial walls 11) Tomatoes- can also improve cholesterol and reduce risk of heart disease 12) Broccoli- good source of magnesium, calcium, and potassium 13) Greek yogurt: high in potassium and calcium 14) Herbs and spices: Celery seed, cilantro, saffron, lemongrass, black cumin, ginseng, cinnamon, cardamom, sweet basil, and ginger 15) Chia and flax seeds- also help to lower cholesterol and blood sugar 16) Beets- high levels of nitrates that relax blood vessels  17) spinach and bananas- high in potassium  -Provided lise of supplements that can help with hypertension:  1) magnesium: one high quality brand is Bioptemizers since it contains all 7 types of magnesium, otherwise over the counter magnesium gluconate 400mg  is a good option 2) B vitamins 3) vitamin D 4) potassium 5) CoQ10 6) L-arginine 7) Vitamin C 8) Beetroot -Educated that goal BP is 120/80. -Made goal to incorporate some of the above foods into diet.    3) Diabetes: -avoid sugar, bread, pasta, rice -avoid snacking -try to incorporate into your diet some of the following foods which are good for diabetes: 1) cinnamon- imitates effects of insulin, increasing glucose transport into cells ([place] or [place] cinnamon is best, least processed) 2) nuts- can slow down the blood sugar response of carbohydrate rich foods 3) oatmeal- contains and anti-inflammatory compound avenanthramide 4) whole-milk yogurt (best types are no sugar, [place] yogurt, or goat/sheep yogurt) 5) beans- high in protein, fiber, and vitamins, low glycemic index 6) broccoli- great source of vitamin A and C 7) quinoa- higher in protein and fiber than other grains 8) spinach- high in vitamin A, fiber, and protein 9) olive oil- reduces glucose levels, LDL, and triglycerides 10) salmon-  excellent amount of omega-3-fatty acids 11) walnuts- rich in antioxidants 12) apples- high in fiber and quercetin 13) carrots- highly nutritious with low impact on blood sugar 14) eggs- improve HDL (good cholesterol), high in protein, keep you satiated 15) turmeric: improves blood sugars, cardiovascular disease, and protects kidney health 16) garlic: improves blood sugar, blood pressure, pain 17) tomatoes: highly nutritious with low impact on blood sugar  4) Atrial fibrillation: Continue Eliquis, taking as prescribed

## 2021-04-05 NOTE — Patient Instructions (Addendum)
HTN: -BP is ___today.  -Advised checking BP daily at home and logging results to bring into follow-up appointment with her PCP and myself. -Reviewed BP meds today.  -Advised regarding healthy foods that can help lower blood pressure and provided with a list: 1) citrus foods- high in vitamins and minerals 2) salmon and other fatty fish - reduces inflammation and oxylipins 3) swiss chard (leafy green)- high level of nitrates 4) pumpkin seeds- one of the best natural sources of magnesium 5) Beans and lentils- high in fiber, magnesium, and potassium 6) Berries- high in flavonoids 7) Amaranth (whole grain, can be cooked similarly to rice and oats)- high in magnesium and fiber 8) Pistachios- even more effective at reducing BP than other nuts 9) Carrots- high in phenolic compounds that relax blood vessels and reduce inflammation 10) Celery- contain phthalides that relax tissues of arterial walls 11) Tomatoes- can also improve cholesterol and reduce risk of heart disease 12) Broccoli- good source of magnesium, calcium, and potassium 13) Greek yogurt: high in potassium and calcium 14) Herbs and spices: Celery seed, cilantro, saffron, lemongrass, black cumin, ginseng, cinnamon, cardamom, sweet basil, and ginger 15) Chia and flax seeds- also help to lower cholesterol and blood sugar 16) Beets- high levels of nitrates that relax blood vessels  17) spinach and bananas- high in potassium  -Provided lise of supplements that can help with hypertension:  1) magnesium: one high quality brand is Bioptemizers since it contains all 7 types of magnesium, otherwise over the counter magnesium gluconate 400mg  is a good option 2) B vitamins 3) vitamin D 4) potassium 5) CoQ10 6) L-arginine 7) Vitamin C 8) Beetroot -Educated that goal BP is 120/80. -Made goal to incorporate some of the above foods into diet.    Diabetes: -avoid sugar, bread, pasta, rice -avoid snacking -try to incorporate into your diet  some of the following foods which are good for diabetes: 1) cinnamon- imitates effects of insulin, increasing glucose transport into cells ( or [place] cinnamon is best, least processed) 2) nuts- can slow down the blood sugar response of carbohydrate rich foods 3) oatmeal- contains and anti-inflammatory compound avenanthramide 4) whole-milk yogurt (best types are no sugar, [place] yogurt, or goat/sheep yogurt) 5) beans- high in protein, fiber, and vitamins, low glycemic index 6) broccoli- great source of vitamin A and C 7) quinoa- higher in protein and fiber than other grains 8) spinach- high in vitamin A, fiber, and protein 9) olive oil- reduces glucose levels, LDL, and triglycerides 10) salmon- excellent amount of omega-3-fatty acids 11) walnuts- rich in antioxidants 12) apples- high in fiber and quercetin 13) carrots- highly nutritious with low impact on blood sugar 14) eggs- improve HDL (good cholesterol), high in protein, keep you satiated 15) turmeric: improves blood sugars, cardiovascular disease, and protects kidney health 16) garlic: improves blood sugar, blood pressure, pain 17) tomatoes: highly nutritious with low impact on blood sugar

## 2021-05-06 DIAGNOSIS — E7800 Pure hypercholesterolemia, unspecified: Secondary | ICD-10-CM | POA: Diagnosis not present

## 2021-05-06 DIAGNOSIS — R5383 Other fatigue: Secondary | ICD-10-CM | POA: Diagnosis not present

## 2021-05-06 DIAGNOSIS — D519 Vitamin B12 deficiency anemia, unspecified: Secondary | ICD-10-CM | POA: Diagnosis not present

## 2021-05-06 DIAGNOSIS — I259 Chronic ischemic heart disease, unspecified: Secondary | ICD-10-CM | POA: Diagnosis not present

## 2021-05-06 DIAGNOSIS — R739 Hyperglycemia, unspecified: Secondary | ICD-10-CM | POA: Diagnosis not present

## 2021-05-06 DIAGNOSIS — I10 Essential (primary) hypertension: Secondary | ICD-10-CM | POA: Diagnosis not present

## 2021-05-06 DIAGNOSIS — Z6830 Body mass index (BMI) 30.0-30.9, adult: Secondary | ICD-10-CM | POA: Diagnosis not present

## 2021-05-13 ENCOUNTER — Telehealth (HOSPITAL_COMMUNITY): Payer: Self-pay | Admitting: Pharmacist

## 2021-05-13 MED ORDER — ENTRESTO 97-103 MG PO TABS: 1.0000 | ORAL_TABLET | Freq: Two times a day (BID) | ORAL | 3 refills | Status: DC

## 2021-05-13 NOTE — Telephone Encounter (Signed)
Entresto refill sent to [facility]. Patient receives through [facility].    Karle Plumber, PharmD, BCPS, BCCP, CPP [facility] Pharmacist [phone]

## 2021-05-14 DIAGNOSIS — D519 Vitamin B12 deficiency anemia, unspecified: Secondary | ICD-10-CM | POA: Diagnosis not present

## 2021-05-21 ENCOUNTER — Other Ambulatory Visit (HOSPITAL_COMMUNITY): Payer: Self-pay | Admitting: Cardiology

## 2021-05-21 DIAGNOSIS — D519 Vitamin B12 deficiency anemia, unspecified: Secondary | ICD-10-CM | POA: Diagnosis not present

## 2021-05-31 ENCOUNTER — Other Ambulatory Visit (HOSPITAL_COMMUNITY): Payer: Self-pay

## 2021-05-31 ENCOUNTER — Encounter (HOSPITAL_COMMUNITY): Payer: Self-pay | Admitting: Cardiology

## 2021-05-31 ENCOUNTER — Ambulatory Visit (HOSPITAL_COMMUNITY)
Admission: RE | Admit: 2021-05-31 | Discharge: 2021-05-31 | Disposition: A | Discharge status: Home / Self Care | Payer: Medicare Other | Source: Ambulatory Visit | Attending: Cardiology | Admitting: Cardiology

## 2021-05-31 ENCOUNTER — Other Ambulatory Visit: Payer: Self-pay

## 2021-05-31 VITALS — BP 120/80 | HR 66 | Wt 218.2 lb

## 2021-05-31 DIAGNOSIS — I2510 Atherosclerotic heart disease of native coronary artery without angina pectoris: Secondary | ICD-10-CM | POA: Diagnosis not present

## 2021-05-31 DIAGNOSIS — Z7901 Long term (current) use of anticoagulants: Secondary | ICD-10-CM | POA: Diagnosis not present

## 2021-05-31 DIAGNOSIS — N183 Chronic kidney disease, stage 3 unspecified: Secondary | ICD-10-CM | POA: Insufficient documentation

## 2021-05-31 DIAGNOSIS — I4892 Unspecified atrial flutter: Secondary | ICD-10-CM | POA: Diagnosis not present

## 2021-05-31 DIAGNOSIS — Z9049 Acquired absence of other specified parts of digestive tract: Secondary | ICD-10-CM | POA: Diagnosis not present

## 2021-05-31 DIAGNOSIS — I255 Ischemic cardiomyopathy: Secondary | ICD-10-CM | POA: Diagnosis not present

## 2021-05-31 DIAGNOSIS — Z7982 Long term (current) use of aspirin: Secondary | ICD-10-CM | POA: Diagnosis not present

## 2021-05-31 DIAGNOSIS — F17210 Nicotine dependence, cigarettes, uncomplicated: Secondary | ICD-10-CM | POA: Insufficient documentation

## 2021-05-31 DIAGNOSIS — I480 Paroxysmal atrial fibrillation: Secondary | ICD-10-CM | POA: Diagnosis not present

## 2021-05-31 DIAGNOSIS — I340 Nonrheumatic mitral (valve) insufficiency: Secondary | ICD-10-CM | POA: Diagnosis not present

## 2021-05-31 DIAGNOSIS — Z8673 Personal history of transient ischemic attack (TIA), and cerebral infarction without residual deficits: Secondary | ICD-10-CM | POA: Diagnosis not present

## 2021-05-31 DIAGNOSIS — I130 Hypertensive heart and chronic kidney disease with heart failure and stage 1 through stage 4 chronic kidney disease, or unspecified chronic kidney disease: Secondary | ICD-10-CM | POA: Diagnosis not present

## 2021-05-31 DIAGNOSIS — I5022 Chronic systolic (congestive) heart failure: Secondary | ICD-10-CM | POA: Insufficient documentation

## 2021-05-31 DIAGNOSIS — Z951 Presence of aortocoronary bypass graft: Secondary | ICD-10-CM | POA: Insufficient documentation

## 2021-05-31 NOTE — Patient Instructions (Signed)
Your physician recommends that you schedule a follow-up appointment in: 6 months (April, 2023), **PLEASE CALL OUR OFFICE IN February TO SCHEDULE THIS APPOINTMENT  If you have any questions or concerns before your next appointment please send [place] a message through [place] or call our office at [phone].    TO LEAVE A MESSAGE FOR THE NURSE SELECT OPTION 2, PLEASE LEAVE A MESSAGE INCLUDING: YOUR NAME DATE OF BIRTH CALL BACK NUMBER REASON FOR CALL**this is important as we prioritize the call backs  YOU WILL RECEIVE A CALL BACK THE SAME DAY AS LONG AS YOU CALL BEFORE 4:00 PM  At the [facility], you and your health needs are our priority. As part of our continuing mission to provide you with exceptional heart care, we have created designated Provider Care Teams. These Care Teams include your primary Cardiologist (physician) and Advanced Practice Providers (APPs- Physician Assistants and Nurse Practitioners) who all work together to provide you with the care you need, when you need it.   You may see any of the following providers on your designated Care Team at your next follow up: Dr Arvilla Meres Dr Carron Curie, NP Robbie Lis, [place] [facility] [place], [place] Karle Plumber, PharmD   Please be sure to bring in all your medications bottles to every appointment.

## 2021-06-02 NOTE — Progress Notes (Signed)
Date:  06/02/2021   ID:  Stanley Wells, DOB [birth year], MRN [id number]   Provider location: [place] [facility] Type of Visit: Established patient   PCP:  [name], MD  Cardiologist: Dr. Shirlee Latch   History of Present Illness: Stanley Wells is a 75 y.o. male who has a history of CAD, ischemic cardiomyopathy/chronic systolic CHF, severe functional mitral regurgitation, and persistent atrial fibrillation.  Patient had MI then CABG in 2003.  Cardiac cath in 2015 showed only LIMA-LAD and SVG-OM patent.  In 2015, EF was 30-35%.  He had atrial flutter in 2015 and again in 11/16, both times was cardioverted.  After the 11/16 cardioversion, he stopped all his meds.  He was admitted again in 12/16 with atrial fibrillation/RVR and a CHF exacerbation.  He was diuresed and started on Eliquis with plan for DCCV after 4 weeks of anticoagulation.  He was brought back at the end of December for cardioversion, but this was unsuccessful.  TEE in 12/16 showed EF 20-25% with severe functional MR.     He was seen by Dr [name] with plan for tentative plan for mitral valve repair versus replacement and possible tricuspid valve repair along with MAZE procedure.  He was referred to CHF clinic for optimization prior to surgery.    He went for left and right heart cath. RHC showed good left and right heart filling pressures but cardiac output was low.  Coronary angiography was stable with patent LIMA-LAD and SVG-OM.  Other SVGs occluded.     He had DCCV in 3/17 on amiodarone, this was later stopped.  He had a CPX in 3/17 with only mild functional impairment from CHF.     He had appendicitis with appendectomy in 12/17.    Evaluated in [facility] in 07/23/2018. He was in atrial flutter in the setting of a URI and underwent DC-CV.  He was put back on amiodarone. Amiodarone later stopped.   Echo in 7/20 showed EF 45-50% with septal bounce on my review with mild LV dilation, normal RV size and  systolic function, IVC normal.   Echo in 8/21 showed EF 45-50% with mild LVH, diffuse hypokinesis, normal RV, no MR.   Patient had atrial fibrillation ablation in 9/21.  He had ERAF and DCCV in 10/21.    Patient was admitted in 6/22 with left-sided weakness, found to have lacunar infarct posterior limb of right internal capsule.  Echo in 6/22 showed EF 60-65%, severe LVH, normal RV, no MR.  CTA neck in 6/22 showed < 50% ICA stenosis. He was started on ASA 81 in addition to Eliquis (had been taking Eliquis regularly).    Patient presents for followup of CHF, CAD, and atrial fibrillation. He is in NSR today.  No palpitations.  Left leg is still weak but overall has improved significant since his CVA. Weight is down 10 lbs.  No significant exertional dyspnea or chest pain.    ECG (personally reviewed): NSR, PVCs   Labs (9/16): LDL 126, HDL 38 Labs (12/16): K 4, creatinine 1.15 Labs (1/17): K 4.4, creatinine 1.09, LDL 42, HDL 39 Labs (10/01/2015): K 4.5 Creatinine 1.27 Dig level 0.4  Labs (4/17): digoxin 0.4, LFTs normal, TSH normal Labs (5/17): K 4.5, creatinine 1.36 Labs (6/17): K 5, creatinine 1.88, BNP 88, LDL 54, HDl 39, digoxin 0.8, LFTs normal, TSH normal Labs (9/17): K 4.8, creatinine 1.3, digoxin 0.6, TSH normal, LFTs normal Labs (12/17): K 3.2, creatinine 1.33, digoxin 0.3  Labs (7/18): K 4.8, digoxin 0.2, creatinine 1.22 Labs (10/18): LDL 41, HDL 39, digoxin 0.3 Labs (0/53): K 4.8, creatinine 1.56, hgb 13.9 Labs (4/19): digoxin 0.5, K 4.6, creatinine 1.37 Labs (07/23/2018): K 4.6 Creatinine 1.49, hgb 13.8 Labs (5/20): K 4.5, creatinine 1.4, LDL 39, hgb 13.5 Labs (11/20): K 4.5, creatinine 1.05, hgb 13.4 Labs (2/21): K 4.1, creatinine 1.26, LDL 38, TGs 178 Labs (9/21): LDL 42 Labs (10/21): K 5.2, creatinine 1.14 Labs (12/21): K 4, creatinine 1.04, hgb 15.2 Labs (10/22): hgb 14.2, LDL 46, K 4.4, creatinine 1.3   PMH: 1. CAD: s/p MI in 2003.  CABG x 5 in 2003 with LIMA-LAD,  SVG-D1, SVG-OM2, seq SVG-PDA/PLV.  LHC/RHC 11/15 with native arteries essentially occluded.  SVG-D1 and seq SVG-PDA/PLV occluded, LIMA and SVG-OM patent.  LHC (1/17) with patent LIMA-LAD, occluded SVG-D, patent SVG-OM => SVG touches down on inferior branch of OM1 that backfills to superior branch of OM1, there are serial 70-80% stenoses in the proximal inferior OM1 branch that may compromise backflow to superior branch OM1, SVG-PDA/PLV totally occluded, EF 25% . 2. Atrial flutter: DCCV 2015.  Recurred 11/16 with DCCV.   3. Chronic systolic CHF: Ischemic cardiomyopathy.  Echo (2015) with moderate-severe MR, EF 30-35%.  TEE (12/16) with severe central MR (functional), EF 20-25% with diffuse hypokinesis, moderately decreased RV systolic function, moderate TR.  RHC (1/17) with mean RA 5, PA 27/12 mean 18, mean PCWP 10, CI 1.7 Fick/1.83 thermo. EF 25% on 1/17 LV-gram.  Cardiac MRI (2/17) with EF 19%, dilated LV, images not adequate to assess delayed enhancement.  - CPX (3/17) with peak VO2 20.4, VE/VCO2 slope 34, RER 1.11 => mild functional impairment.  - Echo (5/17) with EF 40-45%, trivial MR, mild RV dilation with low normal RV systolic function.  - Echo (5/18) with EF 40-45%, basal to mid inferior HK, moderate LVH, trivial MR.  - Echo (5/19) with EF 55-60%, no significant MR.  - Echo (7/20) with EF 45-50% on my review with mild LV dilation, normal RV size and systolic function, IVC normal, trivial MR.   - Echo (8/21) with EF 45-50% with mild LVH, diffuse hypokinesis, normal RV, no MR.  - Echo (6/22): EF 60-65%, severe LVH, normal RV, no MR 4. Mitral regurgitation: Severe on 12/16 TEE, but was only trivial on echo in 5/17 and 5/18 with aggressive medical treatment. 5/19 echo with no significant MR. 8/21 echo with no significant MR.  5. OSA: Not using CPAP.  6. HTN 7. Nephrolithiasis 8. Atrial fibrillation/flutter: Noted in 12/16 initially.  Paroxysmal.  Had DCCV to NSR in 3/17.  - Atrial flutter 12/19 =>  DCCV to NSR, briefly on amiodarone.  - Atrial fibrillation ablation 9/21.  - DCCV to NSR 10/21 9. PFTs (1/17) with minimal obstructive airways disease but severely decreased DLCO (37% predicted), possibly suggestive of pulmonary vascular disease.  10. ABIs (5/17) were normal.  11. CKD 12. Appendectomy 12/17. 13. CVA: 6/22. Lacunar infarct posterior limb of right internal capsule.  CTA neck in 6/22 showed < 50% ICA stenosis. 14. B12 deficiency    Current Outpatient Medications  Medication Sig Dispense Refill   apixaban (ELIQUIS) 5 MG TABS tablet TAKE 1 TABLET(5 MG) BY MOUTH TWICE DAILY 180 tablet 3   aspirin EC 81 MG EC tablet Take 1 tablet (81 mg total) by mouth daily. Swallow whole. 30 tablet 11   isosorbide mononitrate (IMDUR) 30 MG 24 hr tablet TAKE 1 TABLET(30 MG) BY MOUTH DAILY 90 tablet 0  metoprolol succinate (TOPROL-XL) 50 MG 24 hr tablet Take 1 tablet (50 mg total) by mouth 2 (two) times daily. Take with or immediately following a meal. 270 tablet 3   rosuvastatin (CRESTOR) 40 MG tablet TAKE 1 TABLET(40 MG) BY MOUTH DAILY 90 tablet 3   sacubitril-valsartan (ENTRESTO) 97-103 MG Take 1 tablet by mouth 2 (two) times daily. 180 tablet 3   No current facility-administered medications for this encounter.    Allergies:   Patient has no known allergies.   Social History:  The patient  reports that he has been smoking cigarettes. He has never used smokeless tobacco. He reports current alcohol use. He reports that he does not use drugs.   Family History:  The patient's family history includes Cancer in his father and mother.   ROS:  Please see the history of present illness.   All other systems are personally reviewed and negative.   Exam:   BP 120/80   Pulse 66   Wt 99 kg (218 lb 3.2 oz)   SpO2 97%   BMI 30.43 kg/m  General: NAD Neck: No JVD, no thyromegaly or thyroid nodule.  Lungs: Clear to auscultation bilaterally with normal respiratory effort. CV: Nondisplaced PMI.   Heart regular S1/S2, no S3/S4, no murmur.  No peripheral edema.  No carotid bruit.  Normal pedal pulses.  Abdomen: Soft, nontender, no hepatosplenomegaly, no distention.  Skin: Intact without lesions or rashes.  Neurologic: Alert and oriented x 3.  Psych: Normal affect. Extremities: No clubbing or cyanosis.  HEENT: Normal.   Recent Labs: 01/20/2021: ALT 30; BUN 20; Creatinine, Ser 1.31; Hemoglobin 14.4; Platelets 197; Potassium 3.7; Sodium 138  Personally reviewed   Wt Readings from Last 3 Encounters:  05/31/21 99 kg (218 lb 3.2 oz)  04/05/21 98.6 kg (217 lb 6.4 oz)  03/20/21 98 kg (216 lb)      ASSESSMENT AND PLAN:  1. Chronic systolic CHF: EF 25-36% on TEE from 12/16.  cMRI 2/17 with EF 19%, unable to assess for delayed enhancement on this study.  Ischemic cardiomyopathy.  Low cardiac output by RHC in 1/17 but filling pressures optimized.  Despite low output on that RHC, he has been doing quite well with medical treatment.  CPX (3/17) with only mild functional limitation.  Echo in 8/21 with EF 45-50%, normal RV size and systolic function, IVC normal, no significant MR.  Echo in 6/22 with EF 60-65%.  He is not volume overloaded on exam, weight stable, NYHA class II symptoms.  - He has not needed Lasix.   - Continue Toprol XL 50 mg bid.   - Continue Entresto 97/103 bid.   - Hold off on spironolactone as K has been mildly elevated in the past and EF is now in normal range.   2. CAD: s/p CABG.  On 1/17 cath, only LIMA-LAD and SVG-OM2 still patent. No chest pain.  - ASA 81 daily.  - Continue Crestor, check lipids.  3. Atrial fibrillation/flutter: Paroxysmal.  Successful DCCV in 3/17 on amiodarone.  Amiodarone stopped, then he had atrial flutter in 12/19 that was cardioverted back to NSR and amiodarone was restarted transiently.  He had atrial fibrillation ablation in 9/21 and DCCV again in 10/21. He had a CVA in 6/22 and reports compliance with Eliquis.  Now taking both ASA 81 and Eliquis.  He is in NSR.  - Continue apixaban + ASA 81 (given CVA on Eliquis).  4. Mitral regurgitation: Severe by 12/16 TEE, probably functional.  Much improved on  5/17 echo, only trivial.  Trivial on 5/18 echo. No significant MR on echo 12/2017. Trivial on 7/20 echo. Minimal MR on 8/21 echo. Minimal MR on 6/22 echo.  5. CKD: Stage III.  Followup in 6 months  Signed, Marca Ancona, MD  06/02/2021  [facility] [address] [facility] [place] [address] [phone] (office) [phone] (fax)

## 2021-06-04 DIAGNOSIS — D519 Vitamin B12 deficiency anemia, unspecified: Secondary | ICD-10-CM | POA: Diagnosis not present

## 2021-07-04 DIAGNOSIS — D510 Vitamin B12 deficiency anemia due to intrinsic factor deficiency: Secondary | ICD-10-CM | POA: Diagnosis not present

## 2021-08-06 DIAGNOSIS — D510 Vitamin B12 deficiency anemia due to intrinsic factor deficiency: Secondary | ICD-10-CM | POA: Diagnosis not present

## 2021-09-04 ENCOUNTER — Other Ambulatory Visit (HOSPITAL_COMMUNITY): Payer: Self-pay | Admitting: Cardiology

## 2021-09-07 ENCOUNTER — Other Ambulatory Visit (HOSPITAL_COMMUNITY): Payer: Self-pay | Admitting: Cardiology

## 2021-09-09 DIAGNOSIS — R739 Hyperglycemia, unspecified: Secondary | ICD-10-CM | POA: Diagnosis not present

## 2021-09-09 DIAGNOSIS — I10 Essential (primary) hypertension: Secondary | ICD-10-CM | POA: Diagnosis not present

## 2021-09-09 DIAGNOSIS — E7439 Other disorders of intestinal carbohydrate absorption: Secondary | ICD-10-CM | POA: Diagnosis not present

## 2021-09-09 DIAGNOSIS — Z6830 Body mass index (BMI) 30.0-30.9, adult: Secondary | ICD-10-CM | POA: Diagnosis not present

## 2021-09-09 DIAGNOSIS — E7800 Pure hypercholesterolemia, unspecified: Secondary | ICD-10-CM | POA: Diagnosis not present

## 2021-09-18 ENCOUNTER — Ambulatory Visit: Payer: Medicare Other | Admitting: Adult Health

## 2021-09-18 ENCOUNTER — Other Ambulatory Visit (HOSPITAL_COMMUNITY): Payer: Self-pay | Admitting: Cardiology

## 2021-09-18 ENCOUNTER — Other Ambulatory Visit (HOSPITAL_COMMUNITY): Payer: Self-pay

## 2021-09-18 MED ORDER — APIXABAN 5 MG PO TABS: ORAL_TABLET | ORAL | 3 refills | Status: DC

## 2021-09-23 DIAGNOSIS — I10 Essential (primary) hypertension: Secondary | ICD-10-CM | POA: Diagnosis not present

## 2021-09-25 ENCOUNTER — Ambulatory Visit: Payer: Medicare Other | Admitting: Adult Health

## 2021-09-26 ENCOUNTER — Other Ambulatory Visit (HOSPITAL_COMMUNITY): Payer: Self-pay

## 2021-09-26 ENCOUNTER — Telehealth (HOSPITAL_COMMUNITY): Payer: Self-pay | Admitting: Pharmacy Technician

## 2021-09-26 NOTE — Telephone Encounter (Signed)
Advanced Heart Failure Patient Advocate Encounter  The patient was approved for a [employer] grant that will help cover the cost of Entrseto. Eligibility 08/27/2021 - 08/26/2022 BALANCE $10000.00  CARD NO. [id number]   BIN [id number]   PCN [id number]   PC GROUP [id number]

## 2021-09-26 NOTE — Telephone Encounter (Signed)
Advanced Heart Failure Patient Advocate Encounter  Received a voicemail from Dewey with [facility] physicians regarding the patient's Entresto re-enrollment. I have yet to receive anything from the patient. The PAN HF grant is open at this time and could get patient immediate assistance through that grant. Called and left him a message to return my call. Would need the patient's income and household size to proceed.  Archer Asa, CPhT

## 2021-10-01 DIAGNOSIS — D510 Vitamin B12 deficiency anemia due to intrinsic factor deficiency: Secondary | ICD-10-CM | POA: Diagnosis not present

## 2021-10-01 DIAGNOSIS — E7800 Pure hypercholesterolemia, unspecified: Secondary | ICD-10-CM | POA: Diagnosis not present

## 2021-10-01 DIAGNOSIS — I10 Essential (primary) hypertension: Secondary | ICD-10-CM | POA: Diagnosis not present

## 2021-10-03 ENCOUNTER — Other Ambulatory Visit (HOSPITAL_COMMUNITY): Payer: Self-pay

## 2021-10-03 MED ORDER — ENTRESTO 97-103 MG PO TABS
1.0000 | ORAL_TABLET | Freq: Two times a day (BID) | ORAL | 3 refills | Status: DC
  Filled 2021-10-03: qty 180, 90d supply, fill #0
  Filled 2021-12-31: qty 180, 90d supply, fill #1
  Filled 2022-04-01: qty 180, 90d supply, fill #2
  Filled 2022-06-30: qty 180, 90d supply, fill #3

## 2021-10-03 NOTE — Addendum Note (Signed)
Addended by: Jazmen Lindenbaum, Milagros Reap on: 10/03/2021 04:27 PM ? ? Modules accepted: Orders ? ?

## 2021-10-03 NOTE — Telephone Encounter (Signed)
The patient's wife called in stating that she was not able to fill the [facility] with their local [facility]. I called and spoke to the pharmacy and the pharmacy manager.  Their system wont allow any Medicare participants to have COB billing. Sent Chantel (CMA) a request and she sent the new RX to [facility] outpatient pharmacy. Called and spoke with the patient's wife and she is aware we can use the grant there. It will be mailed directly to the patient. ? ?Archer Asa, CPhT ? ?

## 2021-12-02 ENCOUNTER — Ambulatory Visit (HOSPITAL_COMMUNITY)
Admission: RE | Admit: 2021-12-02 | Discharge: 2021-12-02 | Disposition: A | Discharge status: Home / Self Care | Payer: Medicare Other | Source: Ambulatory Visit | Attending: Cardiology | Admitting: Cardiology

## 2021-12-02 ENCOUNTER — Other Ambulatory Visit (HOSPITAL_COMMUNITY): Payer: Self-pay | Admitting: Cardiology

## 2021-12-02 VITALS — BP 136/74 | HR 53 | Ht 71.0 in | Wt 214.0 lb

## 2021-12-02 DIAGNOSIS — I4892 Unspecified atrial flutter: Secondary | ICD-10-CM | POA: Diagnosis not present

## 2021-12-02 DIAGNOSIS — I5022 Chronic systolic (congestive) heart failure: Secondary | ICD-10-CM | POA: Insufficient documentation

## 2021-12-02 DIAGNOSIS — I130 Hypertensive heart and chronic kidney disease with heart failure and stage 1 through stage 4 chronic kidney disease, or unspecified chronic kidney disease: Secondary | ICD-10-CM | POA: Insufficient documentation

## 2021-12-02 DIAGNOSIS — Z951 Presence of aortocoronary bypass graft: Secondary | ICD-10-CM | POA: Diagnosis not present

## 2021-12-02 DIAGNOSIS — I255 Ischemic cardiomyopathy: Secondary | ICD-10-CM | POA: Insufficient documentation

## 2021-12-02 DIAGNOSIS — I5042 Chronic combined systolic (congestive) and diastolic (congestive) heart failure: Secondary | ICD-10-CM

## 2021-12-02 DIAGNOSIS — I480 Paroxysmal atrial fibrillation: Secondary | ICD-10-CM | POA: Diagnosis not present

## 2021-12-02 DIAGNOSIS — Z79899 Other long term (current) drug therapy: Secondary | ICD-10-CM | POA: Insufficient documentation

## 2021-12-02 DIAGNOSIS — I340 Nonrheumatic mitral (valve) insufficiency: Secondary | ICD-10-CM | POA: Insufficient documentation

## 2021-12-02 DIAGNOSIS — I6529 Occlusion and stenosis of unspecified carotid artery: Secondary | ICD-10-CM | POA: Insufficient documentation

## 2021-12-02 DIAGNOSIS — I252 Old myocardial infarction: Secondary | ICD-10-CM | POA: Diagnosis not present

## 2021-12-02 DIAGNOSIS — Z7982 Long term (current) use of aspirin: Secondary | ICD-10-CM | POA: Insufficient documentation

## 2021-12-02 DIAGNOSIS — I2510 Atherosclerotic heart disease of native coronary artery without angina pectoris: Secondary | ICD-10-CM | POA: Insufficient documentation

## 2021-12-02 DIAGNOSIS — N183 Chronic kidney disease, stage 3 unspecified: Secondary | ICD-10-CM | POA: Diagnosis not present

## 2021-12-02 DIAGNOSIS — Z8673 Personal history of transient ischemic attack (TIA), and cerebral infarction without residual deficits: Secondary | ICD-10-CM | POA: Diagnosis not present

## 2021-12-02 DIAGNOSIS — Z7901 Long term (current) use of anticoagulants: Secondary | ICD-10-CM | POA: Insufficient documentation

## 2021-12-02 LAB — BASIC METABOLIC PANEL
Anion gap: 5 (ref 5–15)
BUN: 20 mg/dL (ref 8–23)
CO2: 25 mmol/L (ref 22–32)
Calcium: 8.9 mg/dL (ref 8.9–10.3)
Chloride: 109 mmol/L (ref 98–111)
Creatinine, Ser: 1.08 mg/dL (ref 0.61–1.24)
GFR, Estimated: >60 mL/min (ref >60)
Glucose, Bld: 117 mg/dL — ABNORMAL HIGH (ref 70–99)
Potassium: 4.2 mmol/L (ref 3.5–5.1)
Sodium: 139 mmol/L (ref 135–145)

## 2021-12-02 LAB — BRAIN NATRIURETIC PEPTIDE: B Natriuretic Peptide: 135.3 pg/mL — ABNORMAL HIGH (ref 0.0–100.0)

## 2021-12-02 NOTE — Progress Notes (Signed)
?  ? ?Date:  12/02/2021  ? ?ID:  Stanley Wells, DOB 12/05/1945, MRN [id number]   ?Provider location: [place] [facility] ?Type of Visit: Established patient  ? ?PCP:  [name], MD  ?Cardiologist: Dr. Shirlee Latch ?  ?History of Present Illness: ?Stanley Wells is a 76 y.o. male who has a history of CAD, ischemic cardiomyopathy/chronic systolic CHF, severe functional mitral regurgitation, and persistent atrial fibrillation.  Patient had MI then CABG in 2003.  Cardiac cath in 2015 showed only LIMA-LAD and SVG-OM patent.  In 2015, EF was 30-35%.  He had atrial flutter in 2015 and again in 11/16, both times was cardioverted.  After the 11/16 cardioversion, he stopped all his meds.  He was admitted again in 12/16 with atrial fibrillation/RVR and a CHF exacerbation.  He was diuresed and started on Eliquis with plan for DCCV after 4 weeks of anticoagulation.  He was brought back at the end of December for cardioversion, but this was unsuccessful.  TEE in 12/16 showed EF 20-25% with severe functional MR.   ?  ?He was seen by Dr [name] with plan for tentative plan for mitral valve repair versus replacement and possible tricuspid valve repair along with MAZE procedure.  He was referred to CHF clinic for optimization prior to surgery.  ?  ?He went for left and right heart cath. RHC showed good left and right heart filling pressures but cardiac output was low.  Coronary angiography was stable with patent LIMA-LAD and SVG-OM.  Other SVGs occluded.   ?  ?He had DCCV in 3/17 on amiodarone, this was later stopped.  He had a CPX in 3/17 with only mild functional impairment from CHF.   ?  ?He had appendicitis with appendectomy in 12/17.  ?  ?Evaluated in [facility] in 07/23/2018. He was in atrial flutter in the setting of a URI and underwent DC-CV.  He was put back on amiodarone. Amiodarone later stopped.  ? ?Echo in 7/20 showed EF 45-50% with septal bounce on my review with mild LV dilation, normal RV size and  systolic function, IVC normal.  ? ?Echo in 8/21 showed EF 45-50% with mild LVH, diffuse hypokinesis, normal RV, no MR.  ? ?Patient had atrial fibrillation ablation in 9/21.  He had ERAF and DCCV in 10/21.   ? ?Patient was admitted in 6/22 with left-sided weakness, found to have lacunar infarct posterior limb of right internal capsule.  Echo in 6/22 showed EF 60-65%, severe LVH, normal RV, no MR.  CTA neck in 6/22 showed < 50% ICA stenosis. He was started on ASA 81 in addition to Eliquis (had been taking Eliquis regularly).  ?  ?Patient presents for followup of CHF, CAD, and atrial fibrillation. He is in NSR today.  No palpitations.  Weight down 4 lbs.  He has been doing yardwork like push-mowing with no chest pain or exertional dyspnea.  He had an episode about a week ago where he got lightheaded and "tunnel vision" when he stood up just after having a bowel movement, this was associated with diaphoresis/warmth. This resolved after he sat down.  He has not had an episode before or since then.  ? ?ECG (personally reviewed): NSR, mild sinus arrhythmia ?  ?Labs (9/16): LDL 126, HDL 38 ?Labs (12/16): K 4, creatinine 1.15 ?Labs (1/17): K 4.4, creatinine 1.09, LDL 42, HDL 39 ?Labs (10/01/2015): K 4.5 Creatinine 1.27 Dig level 0.4  ?Labs (4/17): digoxin 0.4, LFTs normal, TSH normal ?Labs (5/17):  K 4.5, creatinine 1.36 ?Labs (6/17): K 5, creatinine 1.88, BNP 88, LDL 54, HDl 39, digoxin 0.8, LFTs normal, TSH normal ?Labs (9/17): K 4.8, creatinine 1.3, digoxin 0.6, TSH normal, LFTs normal ?Labs (12/17): K 3.2, creatinine 1.33, digoxin 0.3 ?Labs (7/18): K 4.8, digoxin 0.2, creatinine 1.22 ?Labs (10/18): LDL 41, HDL 39, digoxin 0.3 ?Labs (3/19): K 4.8, creatinine 1.56, hgb 13.9 ?Labs (4/19): digoxin 0.5, K 4.6, creatinine 1.37 ?Labs (07/23/2018): K 4.6 Creatinine 1.49, hgb 13.8 ?Labs (5/20): K 4.5, creatinine 1.4, LDL 39, hgb 13.5 ?Labs (11/20): K 4.5, creatinine 1.05, hgb 13.4 ?Labs (2/21): K 4.1, creatinine 1.26, LDL 38, TGs  178 ?Labs (9/21): LDL 42 ?Labs (10/21): K 5.2, creatinine 1.14 ?Labs (12/21): K 4, creatinine 1.04, hgb 15.2 ?Labs (10/22): hgb 14.2, LDL 46, K 4.4, creatinine 1.3 ?  ?PMH: ?1. CAD: s/p MI in 2003.  CABG x 5 in 2003 with LIMA-LAD, SVG-D1, SVG-OM2, seq SVG-PDA/PLV.  LHC/RHC 11/15 with native arteries essentially occluded.  SVG-D1 and seq SVG-PDA/PLV occluded, LIMA and SVG-OM patent.  LHC (1/17) with patent LIMA-LAD, occluded SVG-D, patent SVG-OM => SVG touches down on inferior branch of OM1 that backfills to superior branch of OM1, there are serial 70-80% stenoses in the proximal inferior OM1 branch that may compromise backflow to superior branch OM1, SVG-PDA/PLV totally occluded, EF 25% . ?2. Atrial flutter: DCCV 2015.  Recurred 11/16 with DCCV.   ?3. Chronic systolic CHF: Ischemic cardiomyopathy.  Echo (2015) with moderate-severe MR, EF 30-35%.  TEE (12/16) with severe central MR (functional), EF 20-25% with diffuse hypokinesis, moderately decreased RV systolic function, moderate TR.  RHC (1/17) with mean RA 5, PA 27/12 mean 18, mean PCWP 10, CI 1.7 Fick/1.83 thermo. EF 25% on 1/17 LV-gram.  Cardiac MRI (2/17) with EF 19%, dilated LV, images not adequate to assess delayed enhancement.  ?- CPX (3/17) with peak VO2 20.4, VE/VCO2 slope 34, RER 1.11 => mild functional impairment.  ?- Echo (5/17) with EF 40-45%, trivial MR, mild RV dilation with low normal RV systolic function.  ?- Echo (5/18) with EF 40-45%, basal to mid inferior HK, moderate LVH, trivial MR.  ?- Echo (5/19) with EF 55-60%, no significant MR.  ?- Echo (7/20) with EF 45-50% on my review with mild LV dilation, normal RV size and systolic function, IVC normal, trivial MR.   ?- Echo (8/21) with EF 45-50% with mild LVH, diffuse hypokinesis, normal RV, no MR.  ?- Echo (6/22): EF 60-65%, severe LVH, normal RV, no MR ?4. Mitral regurgitation: Severe on 12/16 TEE, but was only trivial on echo in 5/17 and 5/18 with aggressive medical treatment. 5/19 echo with no  significant MR. 8/21 echo with no significant MR.  ?5. OSA: Not using CPAP.  ?6. HTN ?7. Nephrolithiasis ?8. Atrial fibrillation/flutter: Noted in 12/16 initially.  Paroxysmal.  Had DCCV to NSR in 3/17.  ?- Atrial flutter 12/19 => DCCV to NSR, briefly on amiodarone.  ?- Atrial fibrillation ablation 9/21.  ?- DCCV to NSR 10/21 ?9. PFTs (1/17) with minimal obstructive airways disease but severely decreased DLCO (37% predicted), possibly suggestive of pulmonary vascular disease.  ?10. ABIs (5/17) were normal.  ?11. CKD ?12. Appendectomy 12/17. ?13. CVA: 6/22. Lacunar infarct posterior limb of right internal capsule.  CTA neck in 6/22 showed < 50% ICA stenosis. ?14. B12 deficiency ?  ? ?Current Outpatient Medications  ?Medication Sig Dispense Refill  ? apixaban (ELIQUIS) 5 MG TABS tablet TAKE 1 TABLET(5 MG) BY MOUTH TWICE DAILY 90 tablet 3  ?  aspirin EC 81 MG EC tablet Take 1 tablet (81 mg total) by mouth daily. Swallow whole. 30 tablet 11  ? isosorbide mononitrate (IMDUR) 30 MG 24 hr tablet TAKE 1 TABLET(30 MG) BY MOUTH DAILY 90 tablet 0  ? metoprolol succinate (TOPROL-XL) 50 MG 24 hr tablet Take 50 mg by mouth 2 (two) times daily. Take with or immediately following a meal.    ? rosuvastatin (CRESTOR) 40 MG tablet TAKE 1 TABLET(40 MG) BY MOUTH DAILY 90 tablet 3  ? sacubitril-valsartan (ENTRESTO) 97-103 MG Take 1 tablet by mouth 2 (two) times daily. 180 tablet 3  ? ?No current facility-administered medications for this encounter.  ? ? ?Allergies:   Patient has no known allergies.  ? ?Social History:  The patient  reports that he has been smoking cigarettes. He has never used smokeless tobacco. He reports current alcohol use. He reports that he does not use drugs.  ? ?Family History:  The patient's family history includes Cancer in his father and mother.  ? ?ROS:  Please see the history of present illness.   All other systems are personally reviewed and negative.  ? ?Exam:   ?BP 136/74   Pulse (!) 53   Ht 5\' 11"  (1.803  m)   Wt 97.1 kg (214 lb)   SpO2 96%   BMI 29.85 kg/m?  ?General: NAD ?Neck: No JVD, no thyromegaly or thyroid nodule.  ?Lungs: Clear to auscultation bilaterally with normal respiratory effort. ?CV: Nondisplac

## 2021-12-02 NOTE — Patient Instructions (Signed)
Good to see you today!  ? ?No medication changes ? ?Lab work today we will call you with any abnormal results ? ?Your physician has requested that you have an echocardiogram. Echocardiography is a painless test that uses sound waves to create images of your heart. It provides your doctor with information about the size and shape of your heart and how well your heart?s chambers and valves are working. This procedure takes approximately one hour. There are no restrictions for this procedure. ? ?Your physician has requested that you have a carotid duplex. This test is an ultrasound of the carotid arteries in your neck. It looks at blood flow through these arteries that supply the brain with blood. Allow one hour for this exam. There are no restrictions or special instructions. ? ?Your physician recommends that you schedule a follow-up appointment in: 4 months with echocardiogram ? ? ?If you have any questions or concerns before your next appointment please send [place] a message through [place] or call our office at [phone].   ? ?TO LEAVE A MESSAGE FOR THE NURSE SELECT OPTION 2, PLEASE LEAVE A MESSAGE INCLUDING: ?YOUR NAME ?DATE OF BIRTH ?CALL BACK NUMBER ?REASON FOR CALL**this is important as we prioritize the call backs ? ?YOU WILL RECEIVE A CALL BACK THE SAME DAY AS LONG AS YOU CALL BEFORE 4:00 PM ? ?At the [facility], you and your health needs are our priority. As part of our continuing mission to provide you with exceptional heart care, we have created designated Provider Care Teams. These Care Teams include your primary Cardiologist (physician) and Advanced Practice Providers (APPs- Physician Assistants and Nurse Practitioners) who all work together to provide you with the care you need, when you need it.  ? ?You may see any of the following providers on your designated Care Team at your next follow up: ?Dr Glori Bickers ?Dr Loralie Champagne ?Darrick Grinder, NP ?Lyda Jester, PA ?Jessica  [place],NP ?Marlyce Huge, PA ?Audry Riles, PharmD ? ? ?Please be sure to bring in all your medications bottles to every appointment.  ? ? ?

## 2021-12-17 ENCOUNTER — Other Ambulatory Visit (HOSPITAL_COMMUNITY): Payer: Self-pay | Admitting: Cardiology

## 2021-12-31 ENCOUNTER — Other Ambulatory Visit (HOSPITAL_COMMUNITY): Payer: Self-pay

## 2022-01-01 DIAGNOSIS — I4891 Unspecified atrial fibrillation: Secondary | ICD-10-CM | POA: Diagnosis not present

## 2022-01-01 DIAGNOSIS — E7800 Pure hypercholesterolemia, unspecified: Secondary | ICD-10-CM | POA: Diagnosis not present

## 2022-01-01 DIAGNOSIS — I10 Essential (primary) hypertension: Secondary | ICD-10-CM | POA: Diagnosis not present

## 2022-01-06 ENCOUNTER — Ambulatory Visit (HOSPITAL_COMMUNITY)
Admission: RE | Admit: 2022-01-06 | Discharge: 2022-01-06 | Disposition: A | Discharge status: Home / Self Care | Payer: Medicare Other | Source: Ambulatory Visit | Attending: Cardiology | Admitting: Cardiology

## 2022-01-06 DIAGNOSIS — R0989 Other specified symptoms and signs involving the circulatory and respiratory systems: Secondary | ICD-10-CM | POA: Diagnosis not present

## 2022-01-06 DIAGNOSIS — I5042 Chronic combined systolic (congestive) and diastolic (congestive) heart failure: Secondary | ICD-10-CM | POA: Diagnosis not present

## 2022-01-07 ENCOUNTER — Telehealth (HOSPITAL_COMMUNITY): Payer: Self-pay

## 2022-01-07 NOTE — Telephone Encounter (Addendum)
Pt aware, agreeable, and verbalized understanding   ----- Message from Laurey Morale, MD sent at 01/06/2022  3:33 PM EDT ----- Mild bilateral disease

## 2022-03-31 ENCOUNTER — Other Ambulatory Visit (HOSPITAL_COMMUNITY): Payer: Self-pay

## 2022-03-31 DIAGNOSIS — I5042 Chronic combined systolic (congestive) and diastolic (congestive) heart failure: Secondary | ICD-10-CM

## 2022-03-31 NOTE — Progress Notes (Signed)
Orders Placed This Encounter  Procedures   ECHOCARDIOGRAM COMPLETE    Standing Status:   Future    Standing Expiration Date:   04/01/2023    Order Specific Question:   Where should this test be performed    Answer:   [facility]    Order Specific Question:   Perflutren DEFINITY (image enhancing agent) should be administered unless hypersensitivity or allergy exist    Answer:   Administer Perflutren    Order Specific Question:   Reason for exam-Echo    Answer:   Congestive Heart Failure  I50.9    Order Specific Question:   Release to patient    Answer:   Immediate

## 2022-04-01 ENCOUNTER — Other Ambulatory Visit (HOSPITAL_COMMUNITY): Payer: Self-pay

## 2022-04-04 ENCOUNTER — Encounter (HOSPITAL_COMMUNITY): Payer: Self-pay | Admitting: Cardiology

## 2022-04-04 ENCOUNTER — Ambulatory Visit (HOSPITAL_COMMUNITY)
Admission: RE | Admit: 2022-04-04 | Discharge: 2022-04-04 | Disposition: A | Discharge status: Home / Self Care | Payer: Medicare Other | Source: Ambulatory Visit | Attending: Family Medicine | Admitting: Family Medicine

## 2022-04-04 ENCOUNTER — Ambulatory Visit (HOSPITAL_BASED_OUTPATIENT_CLINIC_OR_DEPARTMENT_OTHER)
Admission: RE | Admit: 2022-04-04 | Discharge: 2022-04-04 | Disposition: A | Discharge status: Home / Self Care | Payer: Medicare Other | Source: Ambulatory Visit | Attending: Cardiology | Admitting: Cardiology

## 2022-04-04 VITALS — BP 104/60 | HR 51 | Wt 205.0 lb

## 2022-04-04 DIAGNOSIS — Z7901 Long term (current) use of anticoagulants: Secondary | ICD-10-CM | POA: Diagnosis not present

## 2022-04-04 DIAGNOSIS — I255 Ischemic cardiomyopathy: Secondary | ICD-10-CM | POA: Diagnosis not present

## 2022-04-04 DIAGNOSIS — N183 Chronic kidney disease, stage 3 unspecified: Secondary | ICD-10-CM | POA: Insufficient documentation

## 2022-04-04 DIAGNOSIS — I5022 Chronic systolic (congestive) heart failure: Secondary | ICD-10-CM | POA: Insufficient documentation

## 2022-04-04 DIAGNOSIS — I4819 Other persistent atrial fibrillation: Secondary | ICD-10-CM | POA: Diagnosis not present

## 2022-04-04 DIAGNOSIS — Z8673 Personal history of transient ischemic attack (TIA), and cerebral infarction without residual deficits: Secondary | ICD-10-CM | POA: Diagnosis not present

## 2022-04-04 DIAGNOSIS — I130 Hypertensive heart and chronic kidney disease with heart failure and stage 1 through stage 4 chronic kidney disease, or unspecified chronic kidney disease: Secondary | ICD-10-CM | POA: Diagnosis not present

## 2022-04-04 DIAGNOSIS — Z951 Presence of aortocoronary bypass graft: Secondary | ICD-10-CM | POA: Insufficient documentation

## 2022-04-04 DIAGNOSIS — E785 Hyperlipidemia, unspecified: Secondary | ICD-10-CM | POA: Diagnosis not present

## 2022-04-04 DIAGNOSIS — I2510 Atherosclerotic heart disease of native coronary artery without angina pectoris: Secondary | ICD-10-CM | POA: Insufficient documentation

## 2022-04-04 DIAGNOSIS — Z7982 Long term (current) use of aspirin: Secondary | ICD-10-CM | POA: Diagnosis not present

## 2022-04-04 DIAGNOSIS — Z79899 Other long term (current) drug therapy: Secondary | ICD-10-CM | POA: Insufficient documentation

## 2022-04-04 DIAGNOSIS — I4892 Unspecified atrial flutter: Secondary | ICD-10-CM | POA: Diagnosis not present

## 2022-04-04 DIAGNOSIS — I5042 Chronic combined systolic (congestive) and diastolic (congestive) heart failure: Secondary | ICD-10-CM

## 2022-04-04 DIAGNOSIS — I081 Rheumatic disorders of both mitral and tricuspid valves: Secondary | ICD-10-CM | POA: Insufficient documentation

## 2022-04-04 DIAGNOSIS — E1122 Type 2 diabetes mellitus with diabetic chronic kidney disease: Secondary | ICD-10-CM | POA: Diagnosis not present

## 2022-04-04 LAB — LIPID PANEL
Cholesterol: 126 mg/dL (ref 0–200)
HDL: 46 mg/dL (ref >40)
LDL Cholesterol: 61 mg/dL (ref 0–99)
Total CHOL/HDL Ratio: 2.7 RATIO
Triglycerides: 93 mg/dL (ref <150)
VLDL: 19 mg/dL (ref 0–40)

## 2022-04-04 LAB — ECHOCARDIOGRAM COMPLETE
Area-P 1/2: 1.42 cm2
S' Lateral: 4.5 cm
Single Plane A4C EF: 39.5 %

## 2022-04-04 LAB — CBC
HCT: 45.9 % (ref 39.0–52.0)
Hemoglobin: 15.2 g/dL (ref 13.0–17.0)
MCH: 30.5 pg (ref 26.0–34.0)
MCHC: 33.1 g/dL (ref 30.0–36.0)
MCV: 92 fL (ref 80.0–100.0)
Platelets: 191 10*3/uL (ref 150–400)
RBC: 4.99 MIL/uL (ref 4.22–5.81)
RDW: 14.6 % (ref 11.5–15.5)
WBC: 9.3 10*3/uL (ref 4.0–10.5)
nRBC: 0 % (ref 0.0–0.2)

## 2022-04-04 LAB — BASIC METABOLIC PANEL
Anion gap: 7 (ref 5–15)
BUN: 24 mg/dL — ABNORMAL HIGH (ref 8–23)
CO2: 25 mmol/L (ref 22–32)
Calcium: 9.2 mg/dL (ref 8.9–10.3)
Chloride: 107 mmol/L (ref 98–111)
Creatinine, Ser: 1.23 mg/dL (ref 0.61–1.24)
GFR, Estimated: >60 mL/min (ref >60)
Glucose, Bld: 118 mg/dL — ABNORMAL HIGH (ref 70–99)
Potassium: 4.1 mmol/L (ref 3.5–5.1)
Sodium: 139 mmol/L (ref 135–145)

## 2022-04-04 LAB — BRAIN NATRIURETIC PEPTIDE: B Natriuretic Peptide: 94.8 pg/mL (ref 0.0–100.0)

## 2022-04-04 NOTE — Progress Notes (Signed)
  Echocardiogram 2D Echocardiogram has been performed.  Stanley Wells 04/04/2022, 9:43 AM

## 2022-04-04 NOTE — Patient Instructions (Signed)
There has been no changes to your medications.  Labs done today, your results will be available in MyChart, we will contact you for abnormal readings.  Your physician recommends that you schedule a follow-up appointment in: 6 months ( March 2024) ** please call the office in January to arrange your follow up appointment **  If you have any questions or concerns before your next appointment please send us a message through mychart or call our office at [phone].    TO LEAVE A MESSAGE FOR THE NURSE SELECT OPTION 2, PLEASE LEAVE A MESSAGE INCLUDING: YOUR NAME DATE OF BIRTH CALL BACK NUMBER REASON FOR CALL**this is important as we prioritize the call backs  YOU WILL RECEIVE A CALL BACK THE SAME DAY AS LONG AS YOU CALL BEFORE 4:00 PM  At the [facility], you and your health needs are our priority. As part of our continuing mission to provide you with exceptional heart care, we have created designated Provider Care Teams. These Care Teams include your primary Cardiologist (physician) and Advanced Practice Providers (APPs- Physician Assistants and Nurse Practitioners) who all work together to provide you with the care you need, when you need it.   You may see any of the following providers on your designated Care Team at your next follow up: Dr Daniel Bensimhon Dr Dalton McLean Dr. Aditya Sabharwal Amy Clegg, NP Brittainy Simmons, PA Jessica Milford,NP Lindsay Finch, PA Alma Diaz, NP Lauren Kemp, PharmD   Please be sure to bring in all your medications bottles to every appointment.    

## 2022-04-06 NOTE — Progress Notes (Signed)
Date:  04/06/2022   ID:  Stanley Wells, DOB 1945-08-05, MRN [id number]   Provider location: [facility] Type of Visit: Established patient   PCP:  Angelina Sheriff, MD  Cardiologist: Dr. Aundra Dubin   History of Present Illness: Stanley Wells is a 76 y.o. male who has a history of CAD, ischemic cardiomyopathy/chronic systolic CHF, severe functional mitral regurgitation, and persistent atrial fibrillation.  Patient had MI then CABG in 2003.  Cardiac cath in 2015 showed only LIMA-LAD and SVG-OM patent.  In 2015, EF was 30-35%.  He had atrial flutter in 2015 and again in 11/16, both times was cardioverted.  After the 11/16 cardioversion, he stopped all his meds.  He was admitted again in 12/16 with atrial fibrillation/RVR and a CHF exacerbation.  He was diuresed and started on Eliquis with plan for DCCV after 4 weeks of anticoagulation.  He was brought back at the end of December for cardioversion, but this was unsuccessful.  TEE in 12/16 showed EF 20-25% with severe functional MR.     He was seen by Dr Roxy Manns with plan for tentative plan for mitral valve repair versus replacement and possible tricuspid valve repair along with MAZE procedure.  He was referred to CHF clinic for optimization prior to surgery.    He went for left and right heart cath. RHC showed good left and right heart filling pressures but cardiac output was low.  Coronary angiography was stable with patent LIMA-LAD and SVG-OM.  Other SVGs occluded.     He had DCCV in 3/17 on amiodarone, this was later stopped.  He had a CPX in 3/17 with only mild functional impairment from CHF.     He had appendicitis with appendectomy in 12/17.    Evaluated in [place] in 07/23/2018. He was in atrial flutter in the setting of a URI and underwent DC-CV.  He was put back on amiodarone. Amiodarone later stopped.   Echo in 7/20 showed EF 45-50% with septal bounce on my review with mild LV dilation, normal RV size and  systolic function, IVC normal.   Echo in 8/21 showed EF 45-50% with mild LVH, diffuse hypokinesis, normal RV, no MR.   Patient had atrial fibrillation ablation in 9/21.  He had ERAF and DCCV in 10/21.    Patient was admitted in 6/22 with left-sided weakness, found to have lacunar infarct posterior limb of right internal capsule.  Echo in 6/22 showed EF 60-65%, severe LVH, normal RV, no MR.  CTA neck in 6/22 showed < 50% ICA stenosis. He was started on ASA 81 in addition to Eliquis (had been taking Eliquis regularly).   Echo was done today and reviewed, EF 50%, mild LVH and mild LV dilation, mildly decreased RV systolic function, normal RV, trivial MR.    Patient presents for followup of CHF, CAD, and atrial fibrillation. He is in NSR today.  No palpitations.  Exercising more and denies fatigue or exertional dyspnea.  No chest pain.  He is swimming for exercise and push mows a large yard.  No BRBPR/melena. Weight down 9 lbs.   ECG (personally reviewed): NSR, normal   Labs (9/16): LDL 126, HDL 38 Labs (12/16): K 4, creatinine 1.15 Labs (1/17): K 4.4, creatinine 1.09, LDL 42, HDL 39 Labs (10/01/2015): K 4.5 Creatinine 1.27 Dig level 0.4  Labs (4/17): digoxin 0.4, LFTs normal, TSH normal Labs (5/17): K 4.5, creatinine 1.36 Labs (6/17): K 5, creatinine 1.88, BNP 88, LDL  54, HDl 39, digoxin 0.8, LFTs normal, TSH normal Labs (9/17): K 4.8, creatinine 1.3, digoxin 0.6, TSH normal, LFTs normal Labs (12/17): K 3.2, creatinine 1.33, digoxin 0.3 Labs (7/18): K 4.8, digoxin 0.2, creatinine 1.22 Labs (10/18): LDL 41, HDL 39, digoxin 0.3 Labs (3/19): K 4.8, creatinine 1.56, hgb 13.9 Labs (4/19): digoxin 0.5, K 4.6, creatinine 1.37 Labs (07/23/2018): K 4.6 Creatinine 1.49, hgb 13.8 Labs (5/20): K 4.5, creatinine 1.4, LDL 39, hgb 13.5 Labs (11/20): K 4.5, creatinine 1.05, hgb 13.4 Labs (2/21): K 4.1, creatinine 1.26, LDL 38, TGs 178 Labs (9/21): LDL 42 Labs (10/21): K 5.2, creatinine 1.14 Labs  (12/21): K 4, creatinine 1.04, hgb 15.2 Labs (10/22): hgb 14.2, LDL 46, K 4.4, creatinine 1.3 Labs (5/23): K 4.2, creatinine 1.08, BNP 135   PMH: 1. CAD: s/p MI in 2003.  CABG x 5 in 2003 with LIMA-LAD, SVG-D1, SVG-OM2, seq SVG-PDA/PLV.  LHC/RHC 11/15 with native arteries essentially occluded.  SVG-D1 and seq SVG-PDA/PLV occluded, LIMA and SVG-OM patent.  LHC (1/17) with patent LIMA-LAD, occluded SVG-D, patent SVG-OM => SVG touches down on inferior branch of OM1 that backfills to superior branch of OM1, there are serial 70-80% stenoses in the proximal inferior OM1 branch that may compromise backflow to superior branch OM1, SVG-PDA/PLV totally occluded, EF 25% . 2. Atrial flutter: DCCV 2015.  Recurred 11/16 with DCCV.   3. Chronic systolic CHF: Ischemic cardiomyopathy.  Echo (2015) with moderate-severe MR, EF 30-35%.  TEE (12/16) with severe central MR (functional), EF 20-25% with diffuse hypokinesis, moderately decreased RV systolic function, moderate TR.  RHC (1/17) with mean RA 5, PA 27/12 mean 18, mean PCWP 10, CI 1.7 Fick/1.83 thermo. EF 25% on 1/17 LV-gram.  Cardiac MRI (2/17) with EF 19%, dilated LV, images not adequate to assess delayed enhancement.  - CPX (3/17) with peak VO2 20.4, VE/VCO2 slope 34, RER 1.11 => mild functional impairment.  - Echo (5/17) with EF 40-45%, trivial MR, mild RV dilation with low normal RV systolic function.  - Echo (5/18) with EF 40-45%, basal to mid inferior HK, moderate LVH, trivial MR.  - Echo (5/19) with EF 55-60%, no significant MR.  - Echo (7/20) with EF 45-50% on my review with mild LV dilation, normal RV size and systolic function, IVC normal, trivial MR.   - Echo (8/21) with EF 45-50% with mild LVH, diffuse hypokinesis, normal RV, no MR.  - Echo (6/22): EF 60-65%, severe LVH, normal RV, no MR - Echo (9/23): EF 50%, mild LVH and mild LV dilation, mildly decreased RV systolic function, normal RV, trivial MR. 4. Mitral regurgitation: Severe on 12/16 TEE, but  was only trivial on echo in 5/17 and 5/18 with aggressive medical treatment. 5/19 echo with no significant MR. 8/21 echo with no significant MR.  5. OSA: Not using CPAP.  6. HTN 7. Nephrolithiasis 8. Atrial fibrillation/flutter: Noted in 12/16 initially.  Paroxysmal.  Had DCCV to NSR in 3/17.  - Atrial flutter 12/19 => DCCV to NSR, briefly on amiodarone.  - Atrial fibrillation ablation 9/21.  - DCCV to NSR 10/21 9. PFTs (1/17) with minimal obstructive airways disease but severely decreased DLCO (37% predicted), possibly suggestive of pulmonary vascular disease.  10. ABIs (5/17) were normal.  11. CKD 12. Appendectomy 12/17. 31. CVA: 6/22. Lacunar infarct posterior limb of right internal capsule.  CTA neck in 6/22 showed < 50% ICA stenosis. - Carotid dopplers (6/23) with mild BICA stenosis.  14. B12 deficiency    Current Outpatient Medications  Medication Sig Dispense Refill   apixaban (ELIQUIS) 5 MG TABS tablet TAKE 1 TABLET(5 MG) BY MOUTH TWICE DAILY 90 tablet 3   aspirin EC 81 MG EC tablet Take 1 tablet (81 mg total) by mouth daily. Swallow whole. 30 tablet 11   isosorbide mononitrate (IMDUR) 30 MG 24 hr tablet TAKE 1 TABLET(30 MG) BY MOUTH DAILY 90 tablet 0   metoprolol succinate (TOPROL-XL) 50 MG 24 hr tablet Take 50 mg by mouth 2 (two) times daily. Take with or immediately following a meal.     rosuvastatin (CRESTOR) 40 MG tablet TAKE 1 TABLET(40 MG) BY MOUTH DAILY 90 tablet 3   sacubitril-valsartan (ENTRESTO) 97-103 MG Take 1 tablet by mouth 2 (two) times daily. 180 tablet 3   No current facility-administered medications for this encounter.    Allergies:   Patient has no known allergies.   Social History:  The patient  reports that he has been smoking cigarettes. He has never used smokeless tobacco. He reports current alcohol use. He reports that he does not use drugs.   Family History:  The patient's family history includes Cancer in his father and mother.   ROS:  Please see  the history of present illness.   All other systems are personally reviewed and negative.   Exam:   BP 104/60   Pulse (!) 51   Wt 93 kg (205 lb)   SpO2 96%   BMI 28.59 kg/m  General: NAD Neck: No JVD, no thyromegaly or thyroid nodule.  Lungs: Clear to auscultation bilaterally with normal respiratory effort. CV: Nondisplaced PMI.  Heart regular S1/S2, no S3/S4, no murmur.  No peripheral edema.  No carotid bruit.  Normal pedal pulses.  Abdomen: Soft, nontender, no hepatosplenomegaly, no distention.  Skin: Intact without lesions or rashes.  Neurologic: Alert and oriented x 3.  Psych: Normal affect. Extremities: No clubbing or cyanosis.  HEENT: Normal.   Recent Labs: 04/04/2022: B Natriuretic Peptide 94.8; BUN 24; Creatinine, Ser 1.23; Hemoglobin 15.2; Platelets 191; Potassium 4.1; Sodium 139  Personally reviewed   Wt Readings from Last 3 Encounters:  04/04/22 93 kg (205 lb)  12/02/21 97.1 kg (214 lb)  05/31/21 99 kg (218 lb 3.2 oz)      ASSESSMENT AND PLAN:  1. Chronic systolic CHF: EF 32-67% on TEE from 12/16.  cMRI 2/17 with EF 19%, unable to assess for delayed enhancement on this study.  Ischemic cardiomyopathy.  Low cardiac output by RHC in 1/17 but filling pressures optimized.  Despite low output on that RHC, he has been doing quite well with medical treatment.  CPX (3/17) with only mild functional limitation.  Echo in 8/21 with EF 45-50%, normal RV size and systolic function, IVC normal, no significant MR.  Echo today showed EF 50%, mild LVH and mild LV dilation, mildly decreased RV systolic function, normal RV, trivial MR.  He is not volume overloaded on exam, weight down, NYHA class I-II symptoms.  - He has not needed Lasix.   - Continue Toprol XL 50 mg bid.   - Continue Entresto 97/103 bid.  BMET/BNP today.  - Hold off on spironolactone as K has been mildly elevated in the past.   2. CAD: s/p CABG.  On 1/17 cath, only LIMA-LAD and SVG-OM2 still patent. No chest pain.  - ASA  81 daily.  - Continue Crestor, check lipids.  3. Atrial fibrillation/flutter: Paroxysmal.  Successful DCCV in 3/17 on amiodarone.  Amiodarone stopped, then he had atrial flutter in 12/19 that  was cardioverted back to NSR and amiodarone was restarted transiently.  He had atrial fibrillation ablation in 9/21 and DCCV again in 10/21. He had a CVA in 6/22 and reports compliance with Eliquis.  Now taking both ASA 81 and Eliquis. He is in NSR.  - Continue apixaban + ASA 81 (given CVA on Eliquis). CBC today.  4. Mitral regurgitation: Severe by 12/16 TEE, probably functional.  Much improved on 5/17 echo, only trivial.  Trivial on 5/18 echo. No significant MR on echo 12/2017. Trivial on 7/20 echo. Minimal MR on 8/21 echo. Minimal MR on 6/22 echo. Trivial MR on 9/23 echo.  5. CKD: Stage III. - BMET today.   Followup in 6 months with APP.  Signed, Marca Ancona, MD  04/06/2022  [facility] [address] [facility] [place] [address] [phone] (office) [phone] (fax)

## 2022-04-13 ENCOUNTER — Other Ambulatory Visit (HOSPITAL_COMMUNITY): Payer: Self-pay | Admitting: Cardiology

## 2022-05-30 ENCOUNTER — Other Ambulatory Visit (HOSPITAL_COMMUNITY): Payer: Self-pay

## 2022-05-30 MED ORDER — ISOSORBIDE MONONITRATE ER 30 MG PO TB24: ORAL_TABLET | ORAL | 3 refills | Status: DC

## 2022-06-10 DIAGNOSIS — Z1331 Encounter for screening for depression: Secondary | ICD-10-CM | POA: Diagnosis not present

## 2022-06-10 DIAGNOSIS — Z6829 Body mass index (BMI) 29.0-29.9, adult: Secondary | ICD-10-CM | POA: Diagnosis not present

## 2022-06-10 DIAGNOSIS — Z2821 Immunization not carried out because of patient refusal: Secondary | ICD-10-CM | POA: Diagnosis not present

## 2022-06-10 DIAGNOSIS — Z0000 Encounter for general adult medical examination without abnormal findings: Secondary | ICD-10-CM | POA: Diagnosis not present

## 2022-06-10 DIAGNOSIS — I259 Chronic ischemic heart disease, unspecified: Secondary | ICD-10-CM | POA: Diagnosis not present

## 2022-06-10 DIAGNOSIS — E7439 Other disorders of intestinal carbohydrate absorption: Secondary | ICD-10-CM | POA: Diagnosis not present

## 2022-06-10 DIAGNOSIS — R739 Hyperglycemia, unspecified: Secondary | ICD-10-CM | POA: Diagnosis not present

## 2022-06-18 ENCOUNTER — Other Ambulatory Visit (HOSPITAL_COMMUNITY): Payer: Self-pay

## 2022-06-30 ENCOUNTER — Other Ambulatory Visit (HOSPITAL_COMMUNITY): Payer: Self-pay

## 2022-07-03 DIAGNOSIS — I10 Essential (primary) hypertension: Secondary | ICD-10-CM | POA: Diagnosis not present

## 2022-07-03 DIAGNOSIS — I4891 Unspecified atrial fibrillation: Secondary | ICD-10-CM | POA: Diagnosis not present

## 2022-07-03 DIAGNOSIS — E7800 Pure hypercholesterolemia, unspecified: Secondary | ICD-10-CM | POA: Diagnosis not present

## 2022-07-15 IMAGING — CT CT HEART MORPH/PULM VEIN W/ CM & W/O CA SCORE
2 of 7 series · 11 of 20 positions shown, 13 images · non-contrast
Comparison: None.
COMPARISON: None.

Addendum:
EXAM:
OVER-READ INTERPRETATION  CT CHEST

The following report is an over-read performed by radiologist Dr.
Qui Capri [REDACTED] on 04/04/2020. This
over-read does not include interpretation of cardiac or coronary
anatomy or pathology. The coronary calcium score/coronary CTA
interpretation by the cardiologist is attached.
CLINICAL DATA: 74M with CAD, severe mitral regurgitation, and
persistent atrial fibrillation scheduled for an ablation.
Cardiac CT/CTA
TECHNIQUE: The patient was scanned on a Siemens Somatom scanner.

[Series 9: 0-95% · axial · 0.38mm/px · z∈[+1213,+1317]mm · 5 of 3100 slices shown]
[im 517/3100  vessel]
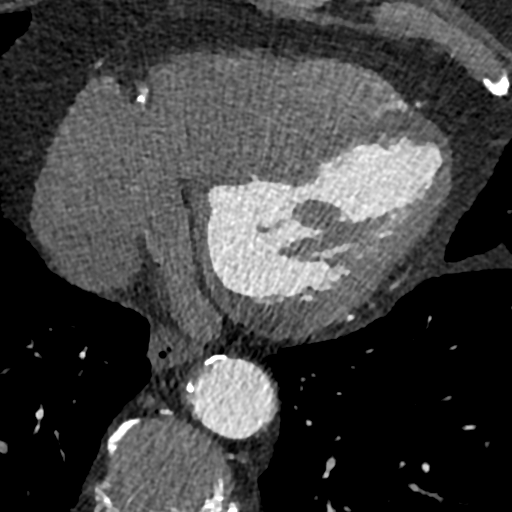
[im 1034/3100  vessel]
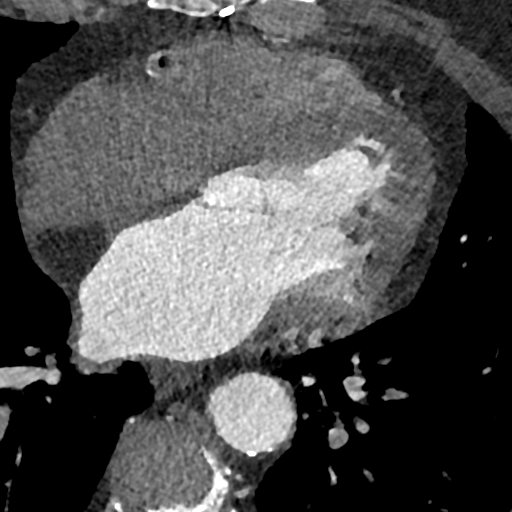
[im 1550/3100  vessel]
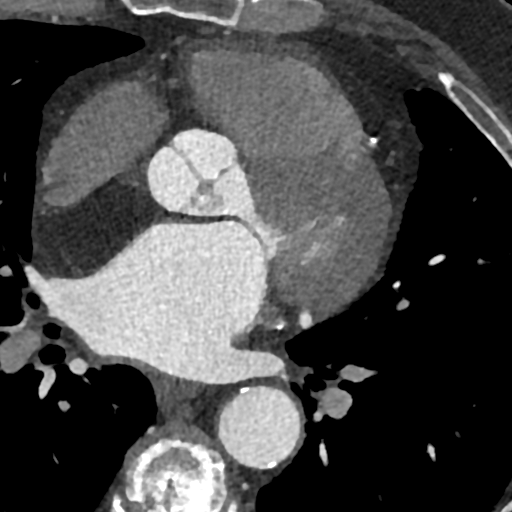
[im 2067/3100  vessel]
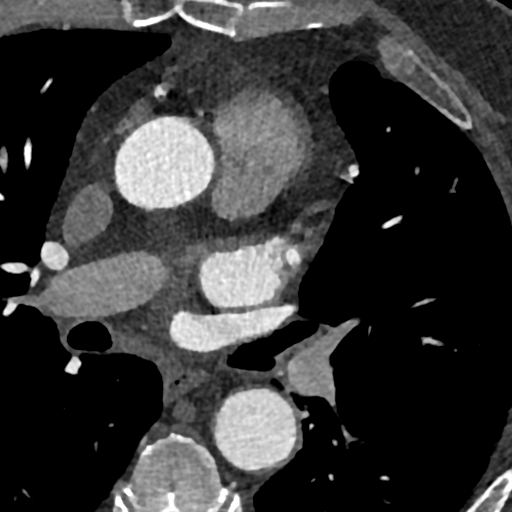
[im 2583/3100  vessel]
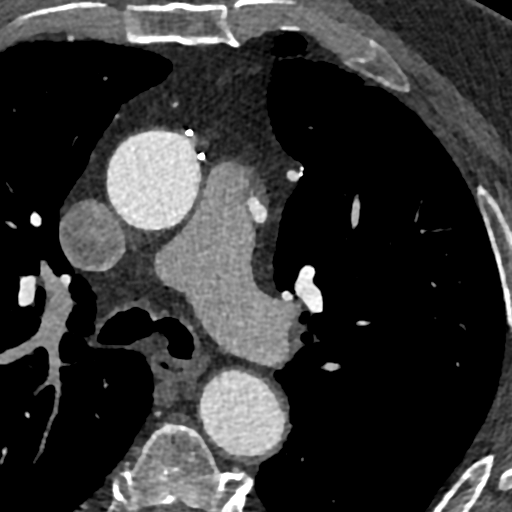

[Series 14: 5-95% · axial · 0.38mm/px · z∈[+1210,+1320]mm · 6 of 3100 slices shown, 8 images]
[im 443/3100  vessel]
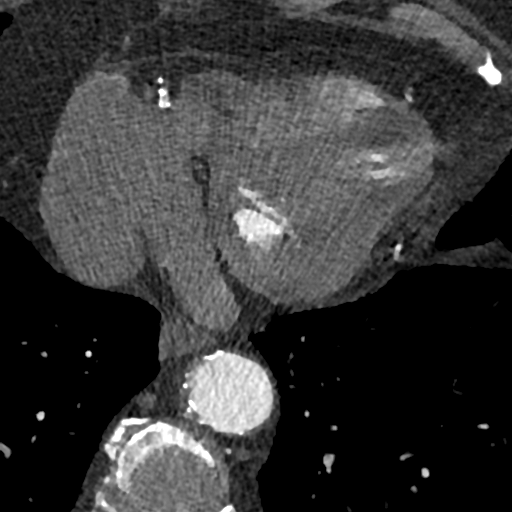
[im 443/3100  lung]
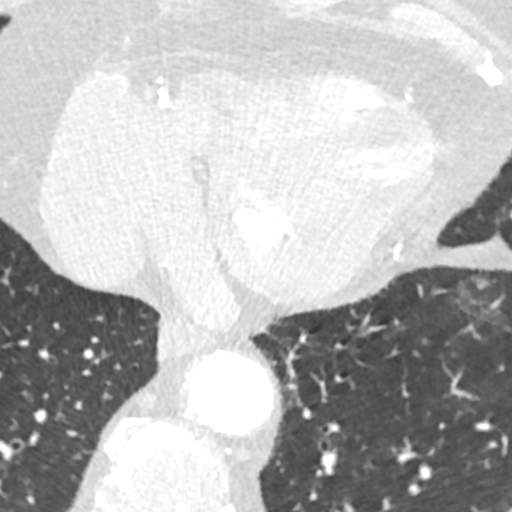
[im 886/3100  vessel]
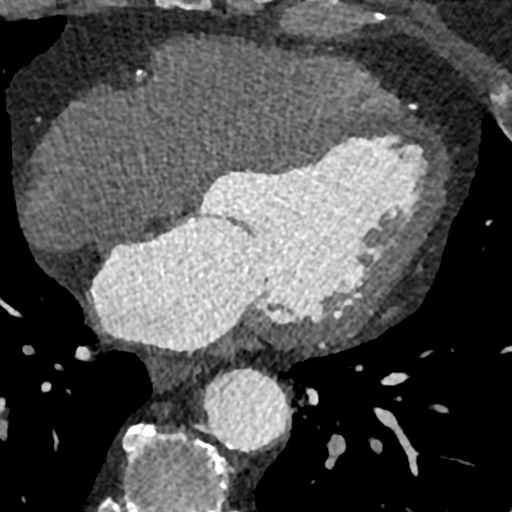
[im 1329/3100  vessel]
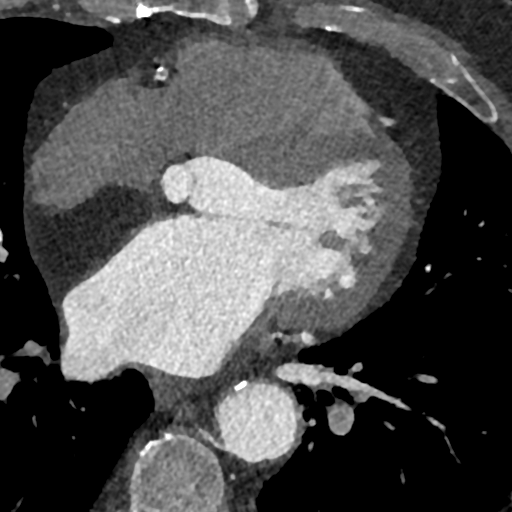
[im 1771/3100  vessel]
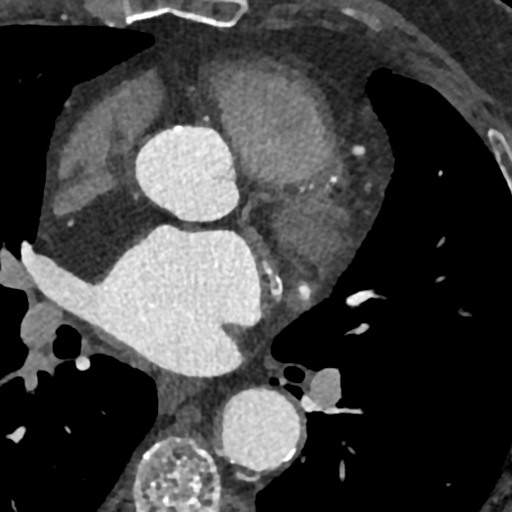
[im 2214/3100  vessel]
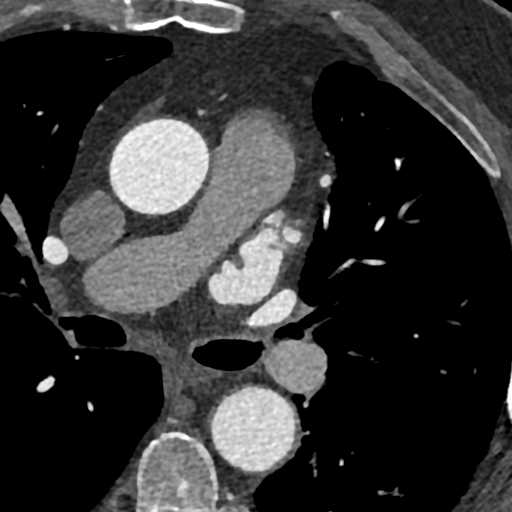
[im 2214/3100  lung]
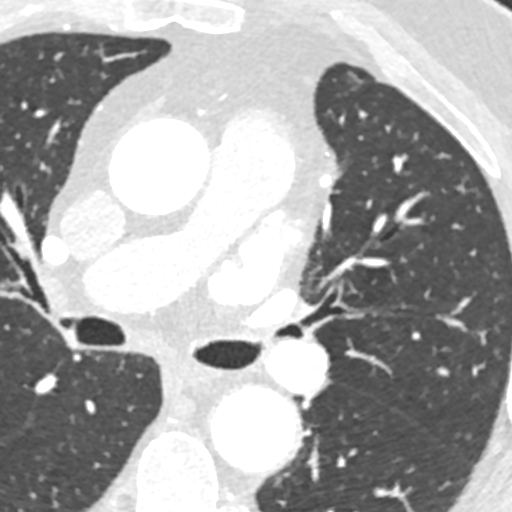
[im 2657/3100  vessel]
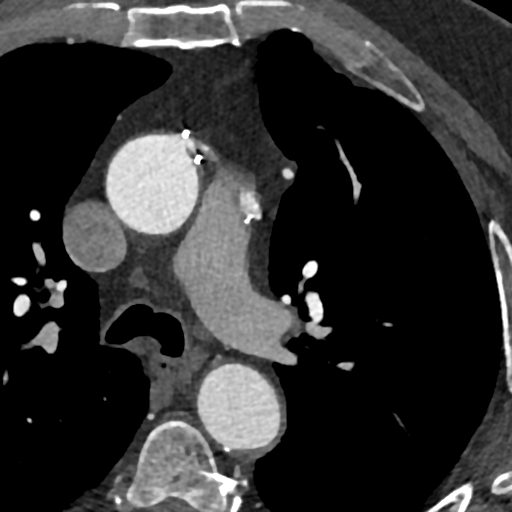

[11 of 20 positions shown; findings below may reference images not displayed]

FINDINGS: Aortic atherosclerosis. Within the visualized portions of the thorax
there are no suspicious appearing pulmonary nodules or masses, there
is no acute consolidative airspace disease, no pleural effusions, no
pneumothorax and no lymphadenopathy. Visualized portions of the
upper abdomen are unremarkable. There are no aggressive appearing
lytic or blastic lesions noted in the visualized portions of the
skeleton. Hemangioma in a midthoracic vertebral body incidentally
noted. Median sternotomy wires.
IMPRESSION: 1.  Aortic Atherosclerosis (PDBY4-6MM.M).
FINDINGS: A 120 kV prospective scan was triggered in the descending thoracic
aorta at 111 HU's. Gantry rotation speed was 280 msecs and
collimation was .9 mm. No beta blockade and no NTG was given. The 3D
data set was reconstructed in 5% intervals of the 60-80 % of the R-R
cycle. Diastolic phases were analyzed on a dedicated work station
using MPR, MIP and VRT modes. The patient received 80 cc of
contrast.

There is normal pulmonary vein drainage into the left atrium (2 on
the right and 2 on the left) with ostial measurements as follows:

RUPV: 28.6 x 16.9 mm

RLPV: 28.0  X 23.2 mm

LUPV: 20.0 x 12.8 mm

LLPV: 26.9 x 15.5 mm

The left atrial appendage is large with two lobes, ostial size 27 x
21 mm and length 45 mm. There is no thrombus in the left atrial
appendage.

The esophagus runs in the left atrial midline and is not in the
proximity to any of the pulmonary veins.

Aorta: Normal caliber. Ascending aorta 3.5 cm. No dissection.
Atherosclerosis of the aorta is noted.

Aortic Valve:  Trileaflet.  No calcifications.

Coronary Arteries: Normal coronary origin. Native coronary arteries
are heavily calcified. Right dominance. Prior coronary artery bypass
grafting. [REDACTED] attaches to the mid LAD and is patent. There is an
ROBERTSHAW to OM that is patent. There is a vein graft that appears to be
going to the RCA but there is no visualized anastamosis. Suspect
that this graft is occluded, though this study is not properly
protocolled for this assessment. The study was performed without use
of NTG and insufficient for plaque evaluation.
IMPRESSION: 1. There is normal pulmonary vein drainage into the left atrium.

2. The left atrial appendage is large with two lobes, ostial size 27
x 21 mm and length 45 mm. There is no thrombus in the left atrial
appendage.

3. The esophagus runs in the left atrial midline and is not in the
proximity to any of the pulmonary veins.

4. Prior CABG. [REDACTED] to LAD and ROBERTSHAW to OM are patent. Cannot confirm
patency of other grafts. Suspect they are occluded.

*** End of Addendum ***
EXAM:
OVER-READ INTERPRETATION  CT CHEST

The following report is an over-read performed by radiologist Dr.
Qui Capri [REDACTED] on 04/04/2020. This
over-read does not include interpretation of cardiac or coronary
anatomy or pathology. The coronary calcium score/coronary CTA
interpretation by the cardiologist is attached.
FINDINGS: Aortic atherosclerosis. Within the visualized portions of the thorax
there are no suspicious appearing pulmonary nodules or masses, there
is no acute consolidative airspace disease, no pleural effusions, no
pneumothorax and no lymphadenopathy. Visualized portions of the
upper abdomen are unremarkable. There are no aggressive appearing
lytic or blastic lesions noted in the visualized portions of the
skeleton. Hemangioma in a midthoracic vertebral body incidentally
noted. Median sternotomy wires.
IMPRESSION: 1.  Aortic Atherosclerosis (PDBY4-6MM.M).

## 2022-09-11 ENCOUNTER — Encounter (HOSPITAL_COMMUNITY): Payer: Self-pay | Admitting: *Deleted

## 2022-09-16 ENCOUNTER — Other Ambulatory Visit (HOSPITAL_COMMUNITY): Payer: Self-pay | Admitting: Cardiology

## 2022-09-16 ENCOUNTER — Other Ambulatory Visit (HOSPITAL_COMMUNITY): Payer: Self-pay

## 2022-09-16 ENCOUNTER — Other Ambulatory Visit: Payer: Self-pay

## 2022-09-16 ENCOUNTER — Telehealth (HOSPITAL_COMMUNITY): Payer: Self-pay | Admitting: Pharmacy Technician

## 2022-09-16 MED ORDER — ROSUVASTATIN CALCIUM 40 MG PO TABS
40.0000 mg | ORAL_TABLET | Freq: Every day | ORAL | 3 refills | Status: DC
  Filled 2022-09-16: qty 90, fill #0
  Filled 2022-09-26 – 2022-09-29 (×2): qty 90, 90d supply, fill #0
  Filled 2022-12-30: qty 90, 90d supply, fill #1
  Filled 2023-03-30: qty 90, 90d supply, fill #2
  Filled 2023-06-26: qty 90, 90d supply, fill #3
  Filled ????-??-??: fill #0

## 2022-09-16 MED ORDER — ENTRESTO 97-103 MG PO TABS
1.0000 | ORAL_TABLET | Freq: Two times a day (BID) | ORAL | 3 refills | Status: DC
  Filled 2022-09-16: qty 180, 90d supply, fill #0
  Filled 2022-12-30: qty 180, 90d supply, fill #1
  Filled 2023-03-30: qty 180, 90d supply, fill #2
  Filled 2023-06-26: qty 180, 90d supply, fill #3

## 2022-09-16 MED ORDER — ISOSORBIDE MONONITRATE ER 30 MG PO TB24
30.0000 mg | ORAL_TABLET | Freq: Every day | ORAL | 3 refills | Status: DC
  Filled 2022-09-16: qty 90, fill #0
  Filled 2022-12-02: qty 90, 90d supply, fill #0
  Filled 2023-02-26 (×3): qty 90, 90d supply, fill #1
  Filled 2023-05-27: qty 90, 90d supply, fill #2

## 2022-09-16 MED ORDER — APIXABAN 5 MG PO TABS
5.0000 mg | ORAL_TABLET | Freq: Two times a day (BID) | ORAL | 3 refills | Status: DC
  Filled 2022-09-16: qty 90, fill #0
  Filled 2022-09-26: qty 90, 45d supply, fill #0
  Filled 2022-11-11: qty 90, 45d supply, fill #1
  Filled 2022-12-30 (×2): qty 90, 45d supply, fill #2
  Filled 2023-01-01: qty 60, 30d supply, fill #2
  Filled 2023-01-28: qty 60, 30d supply, fill #3
  Filled 2023-02-26 (×3): qty 60, 30d supply, fill #4

## 2022-09-16 MED ORDER — METOPROLOL SUCCINATE ER 50 MG PO TB24
50.0000 mg | ORAL_TABLET | Freq: Two times a day (BID) | ORAL | 1 refills | Status: DC
  Filled 2022-09-16 – 2022-09-29 (×3): qty 90, 45d supply, fill #0
  Filled 2023-02-26 (×3): qty 90, 45d supply, fill #1
  Filled ????-??-??: fill #0

## 2022-09-16 NOTE — Telephone Encounter (Signed)
Advanced [facility] Patient Advocate Encounter  The patient was approved for a [employer] grant that will help cover the cost of Entresto, Metoprolol. Total amount awarded, $10,000. Eligibility, 08/27/22 - 08/27/23.  ID [id number]  BIN [id number]  PCN [id number]  Group [id number]  Patient will receive copy of grant information in the mail. Set Entresto up to fill in [place] 90 day RX request for the rest of his medications to [place] ([facility]) to send to [facility].   Charlann Boxer, CPhT

## 2022-09-26 ENCOUNTER — Other Ambulatory Visit: Payer: Self-pay

## 2022-09-26 ENCOUNTER — Other Ambulatory Visit (HOSPITAL_COMMUNITY): Payer: Self-pay

## 2022-09-29 ENCOUNTER — Other Ambulatory Visit: Payer: Self-pay

## 2022-10-09 ENCOUNTER — Ambulatory Visit (HOSPITAL_COMMUNITY)
Admission: RE | Admit: 2022-10-09 | Discharge: 2022-10-09 | Disposition: A | Discharge status: Home / Self Care | Payer: Medicare Other | Source: Ambulatory Visit | Attending: Cardiology | Admitting: Cardiology

## 2022-10-09 ENCOUNTER — Encounter (HOSPITAL_COMMUNITY): Payer: Self-pay

## 2022-10-09 VITALS — BP 140/82 | HR 68 | Wt 208.2 lb

## 2022-10-09 DIAGNOSIS — I5042 Chronic combined systolic (congestive) and diastolic (congestive) heart failure: Secondary | ICD-10-CM | POA: Diagnosis not present

## 2022-10-09 DIAGNOSIS — N183 Chronic kidney disease, stage 3 unspecified: Secondary | ICD-10-CM | POA: Diagnosis not present

## 2022-10-09 DIAGNOSIS — Z7902 Long term (current) use of antithrombotics/antiplatelets: Secondary | ICD-10-CM | POA: Diagnosis not present

## 2022-10-09 DIAGNOSIS — I6523 Occlusion and stenosis of bilateral carotid arteries: Secondary | ICD-10-CM | POA: Insufficient documentation

## 2022-10-09 DIAGNOSIS — I252 Old myocardial infarction: Secondary | ICD-10-CM | POA: Diagnosis not present

## 2022-10-09 DIAGNOSIS — I130 Hypertensive heart and chronic kidney disease with heart failure and stage 1 through stage 4 chronic kidney disease, or unspecified chronic kidney disease: Secondary | ICD-10-CM | POA: Diagnosis not present

## 2022-10-09 DIAGNOSIS — Z951 Presence of aortocoronary bypass graft: Secondary | ICD-10-CM | POA: Diagnosis not present

## 2022-10-09 DIAGNOSIS — Z8673 Personal history of transient ischemic attack (TIA), and cerebral infarction without residual deficits: Secondary | ICD-10-CM | POA: Insufficient documentation

## 2022-10-09 DIAGNOSIS — I340 Nonrheumatic mitral (valve) insufficiency: Secondary | ICD-10-CM | POA: Diagnosis not present

## 2022-10-09 DIAGNOSIS — I4819 Other persistent atrial fibrillation: Secondary | ICD-10-CM | POA: Insufficient documentation

## 2022-10-09 DIAGNOSIS — Z7901 Long term (current) use of anticoagulants: Secondary | ICD-10-CM | POA: Diagnosis not present

## 2022-10-09 DIAGNOSIS — I4892 Unspecified atrial flutter: Secondary | ICD-10-CM | POA: Insufficient documentation

## 2022-10-09 DIAGNOSIS — Z79899 Other long term (current) drug therapy: Secondary | ICD-10-CM | POA: Insufficient documentation

## 2022-10-09 DIAGNOSIS — I255 Ischemic cardiomyopathy: Secondary | ICD-10-CM | POA: Insufficient documentation

## 2022-10-09 DIAGNOSIS — Z7982 Long term (current) use of aspirin: Secondary | ICD-10-CM | POA: Insufficient documentation

## 2022-10-09 DIAGNOSIS — I2510 Atherosclerotic heart disease of native coronary artery without angina pectoris: Secondary | ICD-10-CM | POA: Insufficient documentation

## 2022-10-09 LAB — COMPREHENSIVE METABOLIC PANEL
ALT: 28 U/L (ref 0–44)
AST: 26 U/L (ref 15–41)
Albumin: 3.8 g/dL (ref 3.5–5.0)
Alkaline Phosphatase: 61 U/L (ref 38–126)
Anion gap: 10 (ref 5–15)
BUN: 23 mg/dL (ref 8–23)
CO2: 20 mmol/L — ABNORMAL LOW (ref 22–32)
Calcium: 9 mg/dL (ref 8.9–10.3)
Chloride: 108 mmol/L (ref 98–111)
Creatinine, Ser: 1.01 mg/dL (ref 0.61–1.24)
GFR, Estimated: >60 mL/min (ref >60)
Glucose, Bld: 114 mg/dL — ABNORMAL HIGH (ref 70–99)
Potassium: 3.8 mmol/L (ref 3.5–5.1)
Sodium: 138 mmol/L (ref 135–145)
Total Bilirubin: 1.2 mg/dL (ref 0.3–1.2)
Total Protein: 6.4 g/dL — ABNORMAL LOW (ref 6.5–8.1)

## 2022-10-09 LAB — LIPID PANEL
Cholesterol: 119 mg/dL (ref 0–200)
HDL: 43 mg/dL (ref >40)
LDL Cholesterol: 50 mg/dL (ref 0–99)
Total CHOL/HDL Ratio: 2.8 RATIO
Triglycerides: 130 mg/dL (ref <150)
VLDL: 26 mg/dL (ref 0–40)

## 2022-10-09 NOTE — Patient Instructions (Signed)
It was great to see you today! No medication changes are needed at this time.   Labs today We will only contact you if something comes back abnormal or we need to make some changes. Otherwise no news is good news!  Your physician has requested that you have a carotid duplex. This test is an ultrasound of the carotid arteries in your neck. It looks at blood flow through these arteries that supply the brain with blood. Allow one hour for this exam. There are no restrictions or special instructions. -scheduling will be in contact with an appointment  Your physician wants you to follow-up in: 6 months with Dr Kendall Flack will receive a reminder letter in the mail two months in advance. If you don't receive a letter, please call our office to schedule the follow-up appointment in July for a September 2024.   Do the following things EVERYDAY: Weigh yourself in the morning before breakfast. Write it down and keep it in a log. Take your medicines as prescribed Eat low salt foods--Limit salt (sodium) to 2000 mg per day.  Stay as active as you can everyday Limit all fluids for the day to less than 2 liters  At the [facility], you and your health needs are our priority. As part of our continuing mission to provide you with exceptional heart care, we have created designated Provider Care Teams. These Care Teams include your primary Cardiologist (physician) and Advanced Practice Providers (APPs- Physician Assistants and Nurse Practitioners) who all work together to provide you with the care you need, when you need it.   You may see any of the following providers on your designated Care Team at your next follow up: Dr Glori Bickers Dr Loralie Champagne Dr. Roxana Hires, NP Lyda Jester, [facility] [place], [place] Forestine Na, NP Audry Riles, PharmD   Please be sure to bring in all your medications bottles to every appointment.    Thank you for  choosing [facility]

## 2022-10-09 NOTE — Progress Notes (Signed)
Date:  10/09/2022   ID:  SAEVION [id number], DOB 1945-12-21, MRN [id number]   Provider location: [facility] Type of Visit: Established patient   PCP:  Angelina Sheriff, MD  Cardiologist: Dr. Aundra Dubin   History of Present Illness: Stanley Wells is a 77 y.o. male who has a history of CAD, ischemic cardiomyopathy/chronic systolic CHF, severe functional mitral regurgitation, and persistent atrial fibrillation.  Patient had MI then CABG in 2003.  Cardiac cath in 2015 showed only LIMA-LAD and SVG-OM patent.  In 2015, EF was 30-35%.  He had atrial flutter in 2015 and again in 11/16, both times was cardioverted.  After the 11/16 cardioversion, he stopped all his meds.  He was admitted again in 12/16 with atrial fibrillation/RVR and a CHF exacerbation.  He was diuresed and started on Eliquis with plan for DCCV after 4 weeks of anticoagulation.  He was brought back at the end of December for cardioversion, but this was unsuccessful.  TEE in 12/16 showed EF 20-25% with severe functional MR.     He was seen by Dr Roxy Manns with plan for tentative plan for mitral valve repair versus replacement and possible tricuspid valve repair along with MAZE procedure.  He was referred to CHF clinic for optimization prior to surgery.    He went for left and right heart cath. RHC showed good left and right heart filling pressures but cardiac output was low.  Coronary angiography was stable with patent LIMA-LAD and SVG-OM.  Other SVGs occluded.     He had DCCV in 3/17 on amiodarone, this was later stopped.  He had a CPX in 3/17 with only mild functional impairment from CHF.     He had appendicitis with appendectomy in 12/17.    Evaluated in [place] in 07/23/2018. He was in atrial flutter in the setting of a URI and underwent DC-CV.  He was put back on amiodarone. Amiodarone later stopped.   Echo in 7/20 showed EF 45-50% with septal bounce on my review with mild LV dilation, normal RV size and  systolic function, IVC normal.   Echo in 8/21 showed EF 45-50% with mild LVH, diffuse hypokinesis, normal RV, no MR.   Patient had atrial fibrillation ablation in 9/21.  He had ERAF and DCCV in 10/21.    Patient was admitted in 6/22 with left-sided weakness, found to have lacunar infarct posterior limb of right internal capsule.  Echo in 6/22 showed EF 60-65%, severe LVH, normal RV, no MR.  CTA neck in 6/22 showed < 50% ICA stenosis. He was started on ASA 81 in addition to Eliquis (had been taking Eliquis regularly).   Echo 9/23 EF 50%, mild LVH and mild LV dilation, mildly decreased RV systolic function, normal RV, trivial MR.   He presents to clinic today for routine 6 month f/u. Here w/ wife. Doing well. No complaints. Denies CP. No exertional dyspnea w/ ADLs. Also walks for exercise, ~2 miles, 2-3 days a week w/o limtation. Wt stable. No LEE, orthopnea or PND. Denies palpitations. EKG shows NSR w/ PACs 62 bpm. BP 140/82 in clinic but well controled at home, mostly < [id number] systolic. Compliant w/ medications. No abnormal bleeding.    ECG (personally reviewed): NSR w/ PAC 62 bpm    Labs (9/16): LDL 126, HDL 38 Labs (12/16): K 4, creatinine 1.15 Labs (1/17): K 4.4, creatinine 1.09, LDL 42, HDL 39 Labs (10/01/2015): K 4.5 Creatinine 1.27 Dig level 0.4  Labs (4/17):  digoxin 0.4, LFTs normal, TSH normal Labs (5/17): K 4.5, creatinine 1.36 Labs (6/17): K 5, creatinine 1.88, BNP 88, LDL 54, HDl 39, digoxin 0.8, LFTs normal, TSH normal Labs (9/17): K 4.8, creatinine 1.3, digoxin 0.6, TSH normal, LFTs normal Labs (12/17): K 3.2, creatinine 1.33, digoxin 0.3 Labs (7/18): K 4.8, digoxin 0.2, creatinine 1.22 Labs (10/18): LDL 41, HDL 39, digoxin 0.3 Labs (3/19): K 4.8, creatinine 1.56, hgb 13.9 Labs (4/19): digoxin 0.5, K 4.6, creatinine 1.37 Labs (07/23/2018): K 4.6 Creatinine 1.49, hgb 13.8 Labs (5/20): K 4.5, creatinine 1.4, LDL 39, hgb 13.5 Labs (11/20): K 4.5, creatinine 1.05, hgb 13.4 Labs  (2/21): K 4.1, creatinine 1.26, LDL 38, TGs 178 Labs (9/21): LDL 42 Labs (10/21): K 5.2, creatinine 1.14 Labs (12/21): K 4, creatinine 1.04, hgb 15.2 Labs (10/22): hgb 14.2, LDL 46, K 4.4, creatinine 1.3 Labs (5/23): K 4.2, creatinine 1.08, BNP 135 Labs (9/23): K 4.3, creatinine 1.23, Hgb 15.2, LDL 61 mg/dL    PMH: 1. CAD: s/p MI in 2003.  CABG x 5 in 2003 with LIMA-LAD, SVG-D1, SVG-OM2, seq SVG-PDA/PLV.  LHC/RHC 11/15 with native arteries essentially occluded.  SVG-D1 and seq SVG-PDA/PLV occluded, LIMA and SVG-OM patent.  LHC (1/17) with patent LIMA-LAD, occluded SVG-D, patent SVG-OM => SVG touches down on inferior branch of OM1 that backfills to superior branch of OM1, there are serial 70-80% stenoses in the proximal inferior OM1 branch that may compromise backflow to superior branch OM1, SVG-PDA/PLV totally occluded, EF 25% . 2. Atrial flutter: DCCV 2015.  Recurred 11/16 with DCCV.   3. Chronic systolic CHF: Ischemic cardiomyopathy.  Echo (2015) with moderate-severe MR, EF 30-35%.  TEE (12/16) with severe central MR (functional), EF 20-25% with diffuse hypokinesis, moderately decreased RV systolic function, moderate TR.  RHC (1/17) with mean RA 5, PA 27/12 mean 18, mean PCWP 10, CI 1.7 Fick/1.83 thermo. EF 25% on 1/17 LV-gram.  Cardiac MRI (2/17) with EF 19%, dilated LV, images not adequate to assess delayed enhancement.  - CPX (3/17) with peak VO2 20.4, VE/VCO2 slope 34, RER 1.11 => mild functional impairment.  - Echo (5/17) with EF 40-45%, trivial MR, mild RV dilation with low normal RV systolic function.  - Echo (5/18) with EF 40-45%, basal to mid inferior HK, moderate LVH, trivial MR.  - Echo (5/19) with EF 55-60%, no significant MR.  - Echo (7/20) with EF 45-50% on my review with mild LV dilation, normal RV size and systolic function, IVC normal, trivial MR.   - Echo (8/21) with EF 45-50% with mild LVH, diffuse hypokinesis, normal RV, no MR.  - Echo (6/22): EF 60-65%, severe LVH, normal RV,  no MR - Echo (9/23): EF 50%, mild LVH and mild LV dilation, mildly decreased RV systolic function, normal RV, trivial MR. 4. Mitral regurgitation: Severe on 12/16 TEE, but was only trivial on echo in 5/17 and 5/18 with aggressive medical treatment. 5/19 echo with no significant MR. 8/21 echo with no significant MR.  5. OSA: Not using CPAP.  6. HTN 7. Nephrolithiasis 8. Atrial fibrillation/flutter: Noted in 12/16 initially.  Paroxysmal.  Had DCCV to NSR in 3/17.  - Atrial flutter 12/19 => DCCV to NSR, briefly on amiodarone.  - Atrial fibrillation ablation 9/21.  - DCCV to NSR 10/21 9. PFTs (1/17) with minimal obstructive airways disease but severely decreased DLCO (37% predicted), possibly suggestive of pulmonary vascular disease.  10. ABIs (5/17) were normal.  11. CKD 12. Appendectomy 12/17. 54. CVA: 6/22. Lacunar infarct posterior limb  of right internal capsule.  CTA neck in 6/22 showed < 50% ICA stenosis. - Carotid dopplers (6/23) with mild BICA stenosis.  14. B12 deficiency    Current Outpatient Medications  Medication Sig Dispense Refill   apixaban (ELIQUIS) 5 MG TABS tablet Take 1 tablet (5 mg total) by mouth 2 (two) times daily. 90 tablet 3   aspirin EC 81 MG EC tablet Take 1 tablet (81 mg total) by mouth daily. Swallow whole. 30 tablet 11   isosorbide mononitrate (IMDUR) 30 MG 24 hr tablet TAKE 1 TABLET(30 MG) BY MOUTH DAILY 90 tablet 3   metoprolol succinate (TOPROL-XL) 50 MG 24 hr tablet Take 1 tablet (50 mg total) by mouth 2 (two) times daily. Take with or immediately following a meal. 90 tablet 1   rosuvastatin (CRESTOR) 40 MG tablet Take 1 tablet (40 mg total) by mouth daily. 90 tablet 3   sacubitril-valsartan (ENTRESTO) 97-103 MG Take 1 tablet by mouth 2 (two) times daily. 180 tablet 3   No current facility-administered medications for this encounter.    Allergies:   Patient has no known allergies.   Social History:  The patient  reports that he has been smoking  cigarettes. He has never used smokeless tobacco. He reports current alcohol use. He reports that he does not use drugs.   Family History:  The patient's family history includes Cancer in his father and mother.   ROS:  Please see the history of present illness.   All other systems are personally reviewed and negative.   Exam:   BP (!) 140/82   Pulse 68   Wt 94.4 kg (208 lb 3.2 oz)   SpO2 99%   BMI 29.04 kg/m  General:  Well appearing. No respiratory difficulty HEENT: normal Neck: supple. no JVD. Carotids 2+ bilat; no bruits. No lymphadenopathy or thyromegaly appreciated. Cor: PMI nondisplaced. Regular rate & rhythm. No rubs, gallops or murmurs. Lungs: clear Abdomen: soft, nontender, nondistended. No hepatosplenomegaly. No bruits or masses. Good bowel sounds. Extremities: no cyanosis, clubbing, rash, edema Neuro: alert & oriented x 3, cranial nerves grossly intact. moves all 4 extremities w/o difficulty. Affect pleasant.   Recent Labs: 04/04/2022: B Natriuretic Peptide 94.8; BUN 24; Creatinine, Ser 1.23; Hemoglobin 15.2; Platelets 191; Potassium 4.1; Sodium 139  Personally reviewed   Wt Readings from Last 3 Encounters:  10/09/22 94.4 kg (208 lb 3.2 oz)  04/04/22 93 kg (205 lb)  12/02/21 97.1 kg (214 lb)      ASSESSMENT AND PLAN:  1. Chronic systolic CHF: EF 0000000 on TEE from 12/16.  cMRI 2/17 with EF 19%, unable to assess for delayed enhancement on this study.  Ischemic cardiomyopathy.  Low cardiac output by RHC in 1/17 but filling pressures optimized.  Despite low output on that RHC, he has been doing quite well with medical treatment.  CPX (3/17) with only mild functional limitation.  Echo in 8/21 with EF 45-50%, normal RV size and systolic function, IVC normal, no significant MR.  Last Echo 9/23 showed EF 50%, mild LVH and mild LV dilation, mildly decreased RV systolic function, normal RV, trivial MR.  NYHA Class I-II. Euvolemic on exam - Continue Toprol XL 50 mg bid.   -  Continue Entresto 97/103 bid.   Arlyce Harman previously stopped due to hyperkalemia    - Continue Imdur 30 mg daily  - Does not need daily loop diuretic  - CMP today  2. CAD: s/p CABG.  On 1/17 cath, only LIMA-LAD and SVG-OM2 still  patent. Stable w/o CP.  - Continue ASA 81 daily.  - Continue Crestor 40 mg. Check lipid panel and HFTs today  3. Atrial fibrillation/flutter: Paroxysmal.  Successful DCCV in 3/17 on amiodarone.  Amiodarone stopped, then he had atrial flutter in 12/19 that was cardioverted back to NSR and amiodarone was restarted transiently.  He had atrial fibrillation ablation in 9/21 and DCCV again in 10/21. He had a CVA in 6/22 and reports compliance with Eliquis.  Now taking both ASA 81 and Eliquis. Maintaining NSR on EKG, HR 62 bpm.  - Continue apixaban + ASA 81 mg. Denies abnormal bleeding 4. Mitral regurgitation: Severe by 12/16 TEE, probably functional.  Much improved on 5/17 echo, only trivial.  Trivial on 5/18 echo. No significant MR on echo 12/2017. Trivial on 7/20 echo. Minimal MR on 8/21 echo. Minimal MR on 6/22 echo. Trivial MR on 9/23 echo.  5. CKD: Stage III. B/l SCr 1.3 - CMP today  6. Carotid Artery Disease: Dopplers 6/23 w/ 1-39% bilateral ICA stenosis - repeat scan 6/24 - continue Plavix and Statin   Followup in 6 months with Dr. Aundra Dubin   Signed, Lyda Jester, PA-C  10/09/2022  [address] [place] [id number] [phone] (office) [phone] (fax)

## 2022-11-11 ENCOUNTER — Other Ambulatory Visit: Payer: Self-pay

## 2022-11-11 ENCOUNTER — Other Ambulatory Visit (HOSPITAL_COMMUNITY): Payer: Self-pay

## 2022-12-02 ENCOUNTER — Other Ambulatory Visit (HOSPITAL_COMMUNITY): Payer: Self-pay

## 2022-12-02 ENCOUNTER — Other Ambulatory Visit: Payer: Self-pay

## 2022-12-11 DIAGNOSIS — I10 Essential (primary) hypertension: Secondary | ICD-10-CM | POA: Diagnosis not present

## 2022-12-11 DIAGNOSIS — Z79899 Other long term (current) drug therapy: Secondary | ICD-10-CM | POA: Diagnosis not present

## 2022-12-11 DIAGNOSIS — Z91199 Patient's noncompliance with other medical treatment and regimen due to unspecified reason: Secondary | ICD-10-CM | POA: Diagnosis not present

## 2022-12-11 DIAGNOSIS — E7439 Other disorders of intestinal carbohydrate absorption: Secondary | ICD-10-CM | POA: Diagnosis not present

## 2022-12-11 DIAGNOSIS — E7800 Pure hypercholesterolemia, unspecified: Secondary | ICD-10-CM | POA: Diagnosis not present

## 2022-12-30 ENCOUNTER — Other Ambulatory Visit (HOSPITAL_COMMUNITY): Payer: Self-pay | Admitting: Cardiology

## 2022-12-30 ENCOUNTER — Other Ambulatory Visit (HOSPITAL_COMMUNITY): Payer: Self-pay

## 2022-12-30 ENCOUNTER — Other Ambulatory Visit: Payer: Self-pay

## 2022-12-30 DIAGNOSIS — I5042 Chronic combined systolic (congestive) and diastolic (congestive) heart failure: Secondary | ICD-10-CM

## 2022-12-30 DIAGNOSIS — I2510 Atherosclerotic heart disease of native coronary artery without angina pectoris: Secondary | ICD-10-CM

## 2022-12-31 ENCOUNTER — Other Ambulatory Visit: Payer: Self-pay

## 2023-01-01 ENCOUNTER — Other Ambulatory Visit: Payer: Self-pay

## 2023-01-01 ENCOUNTER — Other Ambulatory Visit (HOSPITAL_COMMUNITY): Payer: Self-pay

## 2023-01-08 ENCOUNTER — Ambulatory Visit (HOSPITAL_COMMUNITY): Payer: Medicare Other

## 2023-01-27 ENCOUNTER — Ambulatory Visit (HOSPITAL_COMMUNITY)
Admission: RE | Admit: 2023-01-27 | Discharge: 2023-01-27 | Disposition: A | Discharge status: Home / Self Care | Payer: Medicare Other | Source: Ambulatory Visit | Attending: Cardiology | Admitting: Cardiology

## 2023-01-27 DIAGNOSIS — I2510 Atherosclerotic heart disease of native coronary artery without angina pectoris: Secondary | ICD-10-CM | POA: Insufficient documentation

## 2023-01-27 DIAGNOSIS — Z8673 Personal history of transient ischemic attack (TIA), and cerebral infarction without residual deficits: Secondary | ICD-10-CM | POA: Diagnosis not present

## 2023-01-28 ENCOUNTER — Other Ambulatory Visit (HOSPITAL_COMMUNITY): Payer: Self-pay

## 2023-01-29 ENCOUNTER — Telehealth (HOSPITAL_COMMUNITY): Payer: Self-pay

## 2023-01-29 NOTE — Telephone Encounter (Addendum)
Pt aware, agreeable, and verbalized understanding   ----- Message from Laurey Morale, MD sent at 01/27/2023  2:06 PM EDT ----- Mild bilateral ICA stenosis.

## 2023-02-26 ENCOUNTER — Other Ambulatory Visit (HOSPITAL_COMMUNITY): Payer: Self-pay

## 2023-03-30 ENCOUNTER — Other Ambulatory Visit (HOSPITAL_COMMUNITY): Payer: Self-pay

## 2023-03-30 ENCOUNTER — Other Ambulatory Visit (HOSPITAL_COMMUNITY): Payer: Self-pay | Admitting: Cardiology

## 2023-03-31 ENCOUNTER — Other Ambulatory Visit: Payer: Self-pay

## 2023-04-01 ENCOUNTER — Other Ambulatory Visit: Payer: Self-pay

## 2023-04-01 ENCOUNTER — Other Ambulatory Visit (HOSPITAL_COMMUNITY): Payer: Self-pay

## 2023-04-01 MED ORDER — APIXABAN 5 MG PO TABS
5.0000 mg | ORAL_TABLET | Freq: Two times a day (BID) | ORAL | 0 refills | Status: DC
  Filled 2023-04-01 – 2023-04-14 (×4): qty 90, 45d supply, fill #0

## 2023-04-02 ENCOUNTER — Other Ambulatory Visit (HOSPITAL_COMMUNITY): Payer: Self-pay

## 2023-04-02 ENCOUNTER — Other Ambulatory Visit: Payer: Self-pay

## 2023-04-07 ENCOUNTER — Other Ambulatory Visit: Payer: Self-pay

## 2023-04-07 ENCOUNTER — Other Ambulatory Visit (HOSPITAL_COMMUNITY): Payer: Self-pay | Admitting: Cardiology

## 2023-04-07 ENCOUNTER — Other Ambulatory Visit (HOSPITAL_COMMUNITY): Payer: Self-pay

## 2023-04-07 MED ORDER — METOPROLOL SUCCINATE ER 50 MG PO TB24
50.0000 mg | ORAL_TABLET | Freq: Two times a day (BID) | ORAL | 1 refills | Status: DC
  Filled 2023-04-07: qty 60, 30d supply, fill #0
  Filled 2023-05-11: qty 60, 30d supply, fill #1

## 2023-04-08 ENCOUNTER — Other Ambulatory Visit: Payer: Self-pay

## 2023-04-14 ENCOUNTER — Other Ambulatory Visit (HOSPITAL_COMMUNITY): Payer: Self-pay

## 2023-04-14 ENCOUNTER — Other Ambulatory Visit: Payer: Self-pay

## 2023-04-28 ENCOUNTER — Ambulatory Visit (HOSPITAL_COMMUNITY)
Admission: RE | Admit: 2023-04-28 | Discharge: 2023-04-28 | Disposition: A | Discharge status: Home / Self Care | Payer: Medicare Other | Source: Ambulatory Visit | Attending: Cardiology | Admitting: Cardiology

## 2023-04-28 ENCOUNTER — Encounter (HOSPITAL_COMMUNITY): Payer: Self-pay | Admitting: Cardiology

## 2023-04-28 VITALS — BP 110/70 | HR 62 | Wt 206.0 lb

## 2023-04-28 DIAGNOSIS — I4892 Unspecified atrial flutter: Secondary | ICD-10-CM | POA: Insufficient documentation

## 2023-04-28 DIAGNOSIS — Z951 Presence of aortocoronary bypass graft: Secondary | ICD-10-CM | POA: Diagnosis not present

## 2023-04-28 DIAGNOSIS — I5042 Chronic combined systolic (congestive) and diastolic (congestive) heart failure: Secondary | ICD-10-CM | POA: Diagnosis not present

## 2023-04-28 DIAGNOSIS — Z79899 Other long term (current) drug therapy: Secondary | ICD-10-CM | POA: Diagnosis not present

## 2023-04-28 DIAGNOSIS — Z7982 Long term (current) use of aspirin: Secondary | ICD-10-CM | POA: Diagnosis not present

## 2023-04-28 DIAGNOSIS — I255 Ischemic cardiomyopathy: Secondary | ICD-10-CM | POA: Diagnosis not present

## 2023-04-28 DIAGNOSIS — I130 Hypertensive heart and chronic kidney disease with heart failure and stage 1 through stage 4 chronic kidney disease, or unspecified chronic kidney disease: Secondary | ICD-10-CM | POA: Insufficient documentation

## 2023-04-28 DIAGNOSIS — I4819 Other persistent atrial fibrillation: Secondary | ICD-10-CM | POA: Diagnosis not present

## 2023-04-28 DIAGNOSIS — I2510 Atherosclerotic heart disease of native coronary artery without angina pectoris: Secondary | ICD-10-CM | POA: Insufficient documentation

## 2023-04-28 DIAGNOSIS — I252 Old myocardial infarction: Secondary | ICD-10-CM | POA: Diagnosis not present

## 2023-04-28 DIAGNOSIS — Z7901 Long term (current) use of anticoagulants: Secondary | ICD-10-CM | POA: Insufficient documentation

## 2023-04-28 DIAGNOSIS — R0602 Shortness of breath: Secondary | ICD-10-CM | POA: Diagnosis not present

## 2023-04-28 DIAGNOSIS — N183 Chronic kidney disease, stage 3 unspecified: Secondary | ICD-10-CM | POA: Insufficient documentation

## 2023-04-28 DIAGNOSIS — Z8673 Personal history of transient ischemic attack (TIA), and cerebral infarction without residual deficits: Secondary | ICD-10-CM | POA: Diagnosis not present

## 2023-04-28 LAB — BASIC METABOLIC PANEL
Anion gap: 9 (ref 5–15)
BUN: 23 mg/dL (ref 8–23)
CO2: 24 mmol/L (ref 22–32)
Calcium: 9.2 mg/dL (ref 8.9–10.3)
Chloride: 105 mmol/L (ref 98–111)
Creatinine, Ser: 1.17 mg/dL (ref 0.61–1.24)
GFR, Estimated: >60 mL/min (ref >60)
Glucose, Bld: 110 mg/dL — ABNORMAL HIGH (ref 70–99)
Potassium: 4.1 mmol/L (ref 3.5–5.1)
Sodium: 138 mmol/L (ref 135–145)

## 2023-04-28 LAB — CBC
HCT: 47 % (ref 39.0–52.0)
Hemoglobin: 15.3 g/dL (ref 13.0–17.0)
MCH: 29.8 pg (ref 26.0–34.0)
MCHC: 32.6 g/dL (ref 30.0–36.0)
MCV: 91.6 fL (ref 80.0–100.0)
Platelets: 194 10*3/uL (ref 150–400)
RBC: 5.13 MIL/uL (ref 4.22–5.81)
RDW: 13.9 % (ref 11.5–15.5)
WBC: 10.4 10*3/uL (ref 4.0–10.5)
nRBC: 0 % (ref 0.0–0.2)

## 2023-04-28 LAB — LIPID PANEL
Cholesterol: 119 mg/dL (ref 0–200)
HDL: 39 mg/dL — ABNORMAL LOW (ref >40)
LDL Cholesterol: 48 mg/dL (ref 0–99)
Total CHOL/HDL Ratio: 3.1 RATIO
Triglycerides: 158 mg/dL — ABNORMAL HIGH (ref <150)
VLDL: 32 mg/dL (ref 0–40)

## 2023-04-28 LAB — BRAIN NATRIURETIC PEPTIDE: B Natriuretic Peptide: 87.1 pg/mL (ref 0.0–100.0)

## 2023-04-28 NOTE — Patient Instructions (Signed)
There has been no changes to your medications.  Labs done today, your results will be available in MyChart, we will contact you for abnormal readings.  Your physician has requested that you have an echocardiogram. Echocardiography is a painless test that uses sound waves to create images of your heart. It provides your doctor with information about the size and shape of your heart and how well your heart's chambers and valves are working. This procedure takes approximately one hour. There are no restrictions for this procedure. Please do NOT wear cologne, perfume, aftershave, or lotions (deodorant is allowed). Please arrive 15 minutes prior to your appointment time.  Your physician recommends that you schedule a follow-up appointment in: 6 months ( March 2025) ** please call the office in January to arrange your follow up appointment. **  If you have any questions or concerns before your next appointment please send Stanley Wells a message through [place] or call our office at [phone].    TO LEAVE A MESSAGE FOR THE NURSE SELECT OPTION 2, PLEASE LEAVE A MESSAGE INCLUDING: YOUR NAME DATE OF BIRTH CALL BACK NUMBER REASON FOR CALL**this is important as we prioritize the call backs  YOU WILL RECEIVE A CALL BACK THE SAME DAY AS LONG AS YOU CALL BEFORE 4:00 PM  At [facility], you and your health needs are our priority. As part of our continuing mission to provide you with exceptional heart care, we have created designated Provider Care Teams. These Care Teams include your primary Cardiologist (physician) and Advanced Practice Providers (APPs- Physician Assistants and Nurse Practitioners) who all work together to provide you with the care you need, when you need it.   You may see any of the following providers on your designated Care Team at your next follow up: Dr Arvilla Meres Dr Marca Ancona Dr. Marcos Eke, NP Robbie Lis, [place] [facility] [place], [place] Brynda Peon, NP Karle Plumber, PharmD   Please be sure to bring in all your medications bottles to every appointment.    Thank you for choosing [facility]

## 2023-04-28 NOTE — Progress Notes (Signed)
Date:  04/28/2023   ID:  Stanley Wells, DOB 26-Dec-1945, MRN [id number]   Provider location: [facility] Type of Visit: Established patient   PCP:  [name], MD  Cardiologist: Dr. Shirlee Latch   History of Present Illness: Stanley Wells is a 77 y.o. male who has a history of CAD, ischemic cardiomyopathy/chronic systolic CHF, severe functional mitral regurgitation, and persistent atrial fibrillation.  Patient had MI then CABG in 2003.  Cardiac cath in 2015 showed only LIMA-LAD and SVG-OM patent.  In 2015, EF was 30-35%.  He had atrial flutter in 2015 and again in 11/16, both times was cardioverted.  After the 11/16 cardioversion, he stopped all his meds.  He was admitted again in 12/16 with atrial fibrillation/RVR and a CHF exacerbation.  He was diuresed and started on Eliquis with plan for DCCV after 4 weeks of anticoagulation.  He was brought back at the end of December for cardioversion, but this was unsuccessful.  TEE in 12/16 showed EF 20-25% with severe functional MR.     He was seen by Dr [name] with plan for tentative plan for mitral valve repair versus replacement and possible tricuspid valve repair along with MAZE procedure.  He was referred to CHF clinic for optimization prior to surgery.    He went for left and right heart cath. RHC showed good left and right heart filling pressures but cardiac output was low.  Coronary angiography was stable with patent LIMA-LAD and SVG-OM.  Other SVGs occluded.     He had DCCV in 3/17 on amiodarone, this was later stopped.  He had a CPX in 3/17 with only mild functional impairment from CHF.     He had appendicitis with appendectomy in 12/17.    Evaluated in [facility] in 07/23/2018. He was in atrial flutter in the setting of a URI and underwent DC-CV.  He was put back on amiodarone. Amiodarone later stopped.   Echo in 7/20 showed EF 45-50% with septal bounce on my review with mild LV dilation, normal RV size and  systolic function, IVC normal.   Echo in 8/21 showed EF 45-50% with mild LVH, diffuse hypokinesis, normal RV, no MR.   Patient had atrial fibrillation ablation in 9/21.  He had ERAF and DCCV in 10/21.    Patient was admitted in 6/22 with left-sided weakness, found to have lacunar infarct posterior limb of right internal capsule.  Echo in 6/22 showed EF 60-65%, severe LVH, normal RV, no MR.  CTA neck in 6/22 showed < 50% ICA stenosis. He was started on ASA 81 in addition to Eliquis (had been taking Eliquis regularly).   Echo 9/23 EF 50%, mild LVH and mild LV dilation, mildly decreased RV systolic function, normal RV, trivial MR.   He returns for followup of CHF, CAD.  Weight down 2 lbs.  He is short of breath walking long distances and walking up hills.  Enjoying watching his grandson play high school football this fall, going to all the home games.  He works out at [employer].  No BRBPR/melena.  No palpitations or lightheadedness.    ECG (personally reviewed): NSR with PAC   Labs (9/16): LDL 126, HDL 38 Labs (12/16): K 4, creatinine 1.15 Labs (1/17): K 4.4, creatinine 1.09, LDL 42, HDL 39 Labs (10/01/2015): K 4.5 Creatinine 1.27 Dig level 0.4  Labs (4/17): digoxin 0.4, LFTs normal, TSH normal Labs (5/17): K 4.5, creatinine 1.36 Labs (6/17): K 5, creatinine  1.88, BNP 88, LDL 54, HDl 39, digoxin 0.8, LFTs normal, TSH normal Labs (9/17): K 4.8, creatinine 1.3, digoxin 0.6, TSH normal, LFTs normal Labs (12/17): K 3.2, creatinine 1.33, digoxin 0.3 Labs (7/18): K 4.8, digoxin 0.2, creatinine 1.22 Labs (10/18): LDL 41, HDL 39, digoxin 0.3 Labs (9/52): K 4.8, creatinine 1.56, hgb 13.9 Labs (4/19): digoxin 0.5, K 4.6, creatinine 1.37 Labs (07/23/2018): K 4.6 Creatinine 1.49, hgb 13.8 Labs (5/20): K 4.5, creatinine 1.4, LDL 39, hgb 13.5 Labs (11/20): K 4.5, creatinine 1.05, hgb 13.4 Labs (2/21): K 4.1, creatinine 1.26, LDL 38, TGs 178 Labs (9/21): LDL 42 Labs (10/21): K 5.2, creatinine 1.14 Labs  (12/21): K 4, creatinine 1.04, hgb 15.2 Labs (10/22): hgb 14.2, LDL 46, K 4.4, creatinine 1.3 Labs (5/23): K 4.2, creatinine 1.08, BNP 135 Labs (9/23): K 4.3, creatinine 1.23, Hgb 15.2, LDL 61 mg/dL  Labs (8/41): K 3.8, creatinine 1.01, LDL 50   PMH: 1. CAD: s/p MI in 2003.  CABG x 5 in 2003 with LIMA-LAD, SVG-D1, SVG-OM2, seq SVG-PDA/PLV.  LHC/RHC 11/15 with native arteries essentially occluded.  SVG-D1 and seq SVG-PDA/PLV occluded, LIMA and SVG-OM patent.  LHC (1/17) with patent LIMA-LAD, occluded SVG-D, patent SVG-OM => SVG touches down on inferior branch of OM1 that backfills to superior branch of OM1, there are serial 70-80% stenoses in the proximal inferior OM1 branch that may compromise backflow to superior branch OM1, SVG-PDA/PLV totally occluded, EF 25% . 2. Atrial flutter: DCCV 2015.  Recurred 11/16 with DCCV.   3. Chronic systolic CHF: Ischemic cardiomyopathy.  Echo (2015) with moderate-severe MR, EF 30-35%.  TEE (12/16) with severe central MR (functional), EF 20-25% with diffuse hypokinesis, moderately decreased RV systolic function, moderate TR.  RHC (1/17) with mean RA 5, PA 27/12 mean 18, mean PCWP 10, CI 1.7 Fick/1.83 thermo. EF 25% on 1/17 LV-gram.  Cardiac MRI (2/17) with EF 19%, dilated LV, images not adequate to assess delayed enhancement.  - CPX (3/17) with peak VO2 20.4, VE/VCO2 slope 34, RER 1.11 => mild functional impairment.  - Echo (5/17) with EF 40-45%, trivial MR, mild RV dilation with low normal RV systolic function.  - Echo (5/18) with EF 40-45%, basal to mid inferior HK, moderate LVH, trivial MR.  - Echo (5/19) with EF 55-60%, no significant MR.  - Echo (7/20) with EF 45-50% on my review with mild LV dilation, normal RV size and systolic function, IVC normal, trivial MR.   - Echo (8/21) with EF 45-50% with mild LVH, diffuse hypokinesis, normal RV, no MR.  - Echo (6/22): EF 60-65%, severe LVH, normal RV, no MR - Echo (9/23): EF 50%, mild LVH and mild LV dilation, mildly  decreased RV systolic function, normal RV, trivial MR. 4. Mitral regurgitation: Severe on 12/16 TEE, but was only trivial on echo in 5/17 and 5/18 with aggressive medical treatment. 5/19 echo with no significant MR. 8/21 echo with no significant MR.  5. OSA: Not using CPAP.  6. HTN 7. Nephrolithiasis 8. Atrial fibrillation/flutter: Noted in 12/16 initially.  Paroxysmal.  Had DCCV to NSR in 3/17.  - Atrial flutter 12/19 => DCCV to NSR, briefly on amiodarone.  - Atrial fibrillation ablation 9/21.  - DCCV to NSR 10/21 9. PFTs (1/17) with minimal obstructive airways disease but severely decreased DLCO (37% predicted), possibly suggestive of pulmonary vascular disease.  10. ABIs (5/17) were normal.  11. CKD 12. Appendectomy 12/17. 13. CVA: 6/22. Lacunar infarct posterior limb of right internal capsule.  CTA neck in 6/22  showed < 50% ICA stenosis. - Carotid dopplers (6/23) with mild BICA stenosis.  - Carotid dopplers (6/24) with mild BICA stenosis.  14. B12 deficiency    Current Outpatient Medications  Medication Sig Dispense Refill   apixaban (ELIQUIS) 5 MG TABS tablet Take 1 tablet (5 mg total) by mouth 2 (two) times daily. 90 tablet 0   aspirin EC 81 MG EC tablet Take 1 tablet (81 mg total) by mouth daily. Swallow whole. 30 tablet 11   isosorbide mononitrate (IMDUR) 30 MG 24 hr tablet Take 1 tablet (30 mg total) by mouth daily. 90 tablet 3   metoprolol succinate (TOPROL-XL) 50 MG 24 hr tablet Take 1 tablet (50 mg total) by mouth 2 (two) times daily. Take with or immediately following a meal. 60 tablet 1   rosuvastatin (CRESTOR) 40 MG tablet Take 1 tablet (40 mg total) by mouth daily. 90 tablet 3   sacubitril-valsartan (ENTRESTO) 97-103 MG Take 1 tablet by mouth 2 (two) times daily. 180 tablet 3   No current facility-administered medications for this encounter.    Allergies:   Patient has no known allergies.   Social History:  The patient  reports that he has been smoking cigarettes. He  started smoking about 61 years ago. He has never used smokeless tobacco. He reports current alcohol use. He reports that he does not use drugs.   Family History:  The patient's family history includes Cancer in his father and mother.   ROS:  Please see the history of present illness.   All other systems are personally reviewed and negative.   Exam:   BP 110/70   Pulse 62   Wt 93.4 kg (206 lb)   SpO2 94%   BMI 28.73 kg/m  General: NAD Neck: No JVD, no thyromegaly or thyroid nodule.  Lungs: Clear to auscultation bilaterally with normal respiratory effort. CV: Nondisplaced PMI.  Heart regular S1/S2, no S3/S4, no murmur.  No peripheral edema.  No carotid bruit.  Normal pedal pulses.  Abdomen: Soft, nontender, no hepatosplenomegaly, no distention.  Skin: Intact without lesions or rashes.  Neurologic: Alert and oriented x 3.  Psych: Normal affect. Extremities: No clubbing or cyanosis.  HEENT: Normal.   Recent Labs: 10/09/2022: ALT 28 04/28/2023: B Natriuretic Peptide 87.1; BUN 23; Creatinine, Ser 1.17; Hemoglobin 15.3; Platelets 194; Potassium 4.1; Sodium 138  Personally reviewed   Wt Readings from Last 3 Encounters:  04/28/23 93.4 kg (206 lb)  10/09/22 94.4 kg (208 lb 3.2 oz)  04/04/22 93 kg (205 lb)    ASSESSMENT AND PLAN:  1. Chronic systolic CHF: EF 16-10% on TEE from 12/16.  cMRI 2/17 with EF 19%, unable to assess for delayed enhancement on this study.  Ischemic cardiomyopathy.  Low cardiac output by RHC in 1/17 but filling pressures optimized.  Despite low output on that RHC, he has been doing quite well with medical treatment.  CPX (3/17) with only mild functional limitation.  Echo in 8/21 with EF 45-50%, normal RV size and systolic function, IVC normal, no significant MR.  Last Echo 9/23 showed EF 50%, mild LVH and mild LV dilation, mildly decreased RV systolic function, normal RV, trivial MR.  NYHA class II, not volume overloaded on exam.  - Continue Toprol XL 50 mg bid.   -  Continue Entresto 97/103 bid.  BMET/BNP today.  Cleda Daub previously stopped due to hyperkalemia    - Continue Imdur 30 mg daily  - Does not need daily loop diuretic  - I  will arrange for repeat echo.  2. CAD: s/p CABG.  On 1/17 cath, only LIMA-LAD and SVG-OM2 still patent. No chest pain.  - Continue ASA 81 daily.  - Continue Crestor 40 mg. Check lipids.  3. Atrial fibrillation/flutter: Paroxysmal.  Successful DCCV in 3/17 on amiodarone.  Amiodarone stopped, then he had atrial flutter in 12/19 that was cardioverted back to NSR and amiodarone was restarted transiently.  He had atrial fibrillation ablation in 9/21 and DCCV again in 10/21. He had a CVA in 6/22 and reports compliance with Eliquis.  Now taking both ASA 81 and Eliquis. In NSR today, no palpitations.  - Continue apixaban + ASA 81 mg. CBC today.  4. Mitral regurgitation: Severe by 12/16 TEE, probably functional.  Much improved on 5/17 echo, only trivial.  Trivial on 5/18 echo. No significant MR on echo 12/2017. Trivial on 7/20 echo. Minimal MR on 8/21 echo. Minimal MR on 6/22 echo. Trivial MR on 9/23 echo.  - I will arrange for repeat echo. 5. CKD: Stage III.  - BMET today.    Followup in 6 months with APP  Signed, Marca Ancona, MD  04/28/2023  [facility] [address] [facility] [place] [address] [phone] (office) [phone] (fax)

## 2023-05-11 ENCOUNTER — Other Ambulatory Visit (HOSPITAL_COMMUNITY): Payer: Self-pay

## 2023-05-15 ENCOUNTER — Ambulatory Visit (HOSPITAL_COMMUNITY)
Admission: RE | Admit: 2023-05-15 | Discharge: 2023-05-15 | Disposition: A | Discharge status: Home / Self Care | Payer: Medicare Other | Source: Ambulatory Visit | Attending: Cardiology | Admitting: Cardiology

## 2023-05-15 DIAGNOSIS — I4892 Unspecified atrial flutter: Secondary | ICD-10-CM | POA: Insufficient documentation

## 2023-05-15 DIAGNOSIS — I509 Heart failure, unspecified: Secondary | ICD-10-CM | POA: Insufficient documentation

## 2023-05-15 DIAGNOSIS — I2510 Atherosclerotic heart disease of native coronary artery without angina pectoris: Secondary | ICD-10-CM | POA: Insufficient documentation

## 2023-05-15 DIAGNOSIS — I5042 Chronic combined systolic (congestive) and diastolic (congestive) heart failure: Secondary | ICD-10-CM

## 2023-05-15 LAB — ECHOCARDIOGRAM COMPLETE
AR max vel: 2.72 cm2
AV Area VTI: 3.05 cm2
AV Area mean vel: 2.76 cm2
AV Mean grad: 2 mm[Hg]
AV Peak grad: 5.3 mm[Hg]
Ao pk vel: 1.15 m/s
Area-P 1/2: 5.88 cm2
Calc EF: 48.5 %
MV VTI: 2.42 cm2
S' Lateral: 4.1 cm
Single Plane A2C EF: 54.5 %
Single Plane A4C EF: 48 %

## 2023-05-15 NOTE — Progress Notes (Signed)
  Echocardiogram 2D Echocardiogram has been performed.  Neita Garnet Kameren Baade 05/15/2023, 10:53 AM

## 2023-05-27 ENCOUNTER — Other Ambulatory Visit (HOSPITAL_COMMUNITY): Payer: Self-pay

## 2023-05-27 ENCOUNTER — Other Ambulatory Visit (HOSPITAL_COMMUNITY): Payer: Self-pay | Admitting: Cardiology

## 2023-05-27 ENCOUNTER — Other Ambulatory Visit: Payer: Self-pay

## 2023-05-27 MED ORDER — APIXABAN 5 MG PO TABS
5.0000 mg | ORAL_TABLET | Freq: Two times a day (BID) | ORAL | 3 refills | Status: DC
  Filled 2023-05-27: qty 180, 90d supply, fill #0
  Filled 2023-08-26: qty 60, 30d supply, fill #1
  Filled 2023-08-27: qty 180, 90d supply, fill #1
  Filled 2023-11-24: qty 180, 90d supply, fill #2
  Filled 2024-03-07: qty 180, 90d supply, fill #3

## 2023-06-11 ENCOUNTER — Other Ambulatory Visit: Payer: Self-pay

## 2023-06-11 ENCOUNTER — Other Ambulatory Visit (HOSPITAL_COMMUNITY): Payer: Self-pay | Admitting: Cardiology

## 2023-06-11 ENCOUNTER — Other Ambulatory Visit (HOSPITAL_COMMUNITY): Payer: Self-pay

## 2023-06-11 MED ORDER — METOPROLOL SUCCINATE ER 50 MG PO TB24
50.0000 mg | ORAL_TABLET | Freq: Two times a day (BID) | ORAL | 3 refills | Status: DC
  Filled 2023-06-11: qty 180, 90d supply, fill #0

## 2023-06-18 DIAGNOSIS — Z0000 Encounter for general adult medical examination without abnormal findings: Secondary | ICD-10-CM | POA: Diagnosis not present

## 2023-06-18 DIAGNOSIS — Z125 Encounter for screening for malignant neoplasm of prostate: Secondary | ICD-10-CM | POA: Diagnosis not present

## 2023-06-18 DIAGNOSIS — L570 Actinic keratosis: Secondary | ICD-10-CM | POA: Diagnosis not present

## 2023-06-18 DIAGNOSIS — Z131 Encounter for screening for diabetes mellitus: Secondary | ICD-10-CM | POA: Diagnosis not present

## 2023-06-18 DIAGNOSIS — I10 Essential (primary) hypertension: Secondary | ICD-10-CM | POA: Diagnosis not present

## 2023-06-18 DIAGNOSIS — Z136 Encounter for screening for cardiovascular disorders: Secondary | ICD-10-CM | POA: Diagnosis not present

## 2023-06-25 DIAGNOSIS — Z136 Encounter for screening for cardiovascular disorders: Secondary | ICD-10-CM | POA: Diagnosis not present

## 2023-06-26 ENCOUNTER — Other Ambulatory Visit (HOSPITAL_COMMUNITY): Payer: Self-pay

## 2023-08-10 ENCOUNTER — Telehealth (HOSPITAL_COMMUNITY): Payer: Self-pay | Admitting: Pharmacy Technician

## 2023-08-10 NOTE — Telephone Encounter (Signed)
 [facility] Patient Advocate Encounter  The patient was approved for a [employer] grant that will help cover the cost of Entresto  and Metoprolol . Total amount awarded, $10,000. Eligibility, 08/28/23 - 08/26/24.  ID [id number]  BIN [id number]  PCN [id number]  Group [id number]

## 2023-08-15 ENCOUNTER — Emergency Department (HOSPITAL_COMMUNITY): Payer: Medicare Other

## 2023-08-15 ENCOUNTER — Inpatient Hospital Stay (HOSPITAL_COMMUNITY)
Admission: EM | Admit: 2023-08-15 | Discharge: 2023-08-18 | DRG: 683 | Disposition: A | Discharge status: Home / Self Care | Payer: Medicare Other | Attending: Internal Medicine | Admitting: Internal Medicine

## 2023-08-15 ENCOUNTER — Other Ambulatory Visit: Payer: Self-pay

## 2023-08-15 ENCOUNTER — Encounter (HOSPITAL_COMMUNITY): Payer: Self-pay | Admitting: *Deleted

## 2023-08-15 DIAGNOSIS — E663 Overweight: Secondary | ICD-10-CM | POA: Diagnosis present

## 2023-08-15 DIAGNOSIS — E785 Hyperlipidemia, unspecified: Secondary | ICD-10-CM | POA: Diagnosis present

## 2023-08-15 DIAGNOSIS — K529 Noninfective gastroenteritis and colitis, unspecified: Secondary | ICD-10-CM | POA: Diagnosis present

## 2023-08-15 DIAGNOSIS — Z6828 Body mass index (BMI) 28.0-28.9, adult: Secondary | ICD-10-CM

## 2023-08-15 DIAGNOSIS — I5022 Chronic systolic (congestive) heart failure: Secondary | ICD-10-CM | POA: Diagnosis present

## 2023-08-15 DIAGNOSIS — I255 Ischemic cardiomyopathy: Secondary | ICD-10-CM | POA: Diagnosis present

## 2023-08-15 DIAGNOSIS — Z7901 Long term (current) use of anticoagulants: Secondary | ICD-10-CM

## 2023-08-15 DIAGNOSIS — Z9049 Acquired absence of other specified parts of digestive tract: Secondary | ICD-10-CM

## 2023-08-15 DIAGNOSIS — I7143 Infrarenal abdominal aortic aneurysm, without rupture: Secondary | ICD-10-CM | POA: Diagnosis present

## 2023-08-15 DIAGNOSIS — Z1152 Encounter for screening for COVID-19: Secondary | ICD-10-CM

## 2023-08-15 DIAGNOSIS — N179 Acute kidney failure, unspecified: Secondary | ICD-10-CM | POA: Diagnosis not present

## 2023-08-15 DIAGNOSIS — I4819 Other persistent atrial fibrillation: Secondary | ICD-10-CM | POA: Diagnosis present

## 2023-08-15 DIAGNOSIS — E872 Acidosis, unspecified: Secondary | ICD-10-CM | POA: Diagnosis present

## 2023-08-15 DIAGNOSIS — Z8673 Personal history of transient ischemic attack (TIA), and cerebral infarction without residual deficits: Secondary | ICD-10-CM

## 2023-08-15 DIAGNOSIS — F17210 Nicotine dependence, cigarettes, uncomplicated: Secondary | ICD-10-CM | POA: Diagnosis present

## 2023-08-15 DIAGNOSIS — I959 Hypotension, unspecified: Secondary | ICD-10-CM | POA: Diagnosis present

## 2023-08-15 DIAGNOSIS — I110 Hypertensive heart disease with heart failure: Secondary | ICD-10-CM | POA: Diagnosis present

## 2023-08-15 DIAGNOSIS — E860 Dehydration: Secondary | ICD-10-CM | POA: Diagnosis present

## 2023-08-15 DIAGNOSIS — D72829 Elevated white blood cell count, unspecified: Secondary | ICD-10-CM | POA: Diagnosis present

## 2023-08-15 DIAGNOSIS — I2510 Atherosclerotic heart disease of native coronary artery without angina pectoris: Secondary | ICD-10-CM | POA: Diagnosis present

## 2023-08-15 DIAGNOSIS — Z66 Do not resuscitate: Secondary | ICD-10-CM | POA: Diagnosis present

## 2023-08-15 DIAGNOSIS — Z87442 Personal history of urinary calculi: Secondary | ICD-10-CM

## 2023-08-15 DIAGNOSIS — Z809 Family history of malignant neoplasm, unspecified: Secondary | ICD-10-CM

## 2023-08-15 DIAGNOSIS — I9589 Other hypotension: Secondary | ICD-10-CM | POA: Diagnosis present

## 2023-08-15 DIAGNOSIS — Z7982 Long term (current) use of aspirin: Secondary | ICD-10-CM

## 2023-08-15 DIAGNOSIS — Z79899 Other long term (current) drug therapy: Secondary | ICD-10-CM

## 2023-08-15 DIAGNOSIS — Z951 Presence of aortocoronary bypass graft: Secondary | ICD-10-CM

## 2023-08-15 LAB — URINALYSIS, ROUTINE W REFLEX MICROSCOPIC
Bilirubin Urine: NEGATIVE
Glucose, UA: NEGATIVE mg/dL
Hgb urine dipstick: NEGATIVE
Ketones, ur: NEGATIVE mg/dL
Leukocytes,Ua: NEGATIVE
Nitrite: NEGATIVE
Protein, ur: NEGATIVE mg/dL
Specific Gravity, Urine: 1.018 (ref 1.005–1.030)
pH: 5 (ref 5.0–8.0)

## 2023-08-15 LAB — COMPREHENSIVE METABOLIC PANEL
ALT: 38 U/L (ref 0–44)
AST: 23 U/L (ref 15–41)
Albumin: 3.9 g/dL (ref 3.5–5.0)
Alkaline Phosphatase: 63 U/L (ref 38–126)
Anion gap: 13 (ref 5–15)
BUN: 112 mg/dL — ABNORMAL HIGH (ref 8–23)
CO2: 14 mmol/L — ABNORMAL LOW (ref 22–32)
Calcium: 8.5 mg/dL — ABNORMAL LOW (ref 8.9–10.3)
Chloride: 107 mmol/L (ref 98–111)
Creatinine, Ser: 2.64 mg/dL — ABNORMAL HIGH (ref 0.61–1.24)
GFR, Estimated: 24 mL/min — ABNORMAL LOW (ref >60)
Glucose, Bld: 164 mg/dL — ABNORMAL HIGH (ref 70–99)
Potassium: 4.2 mmol/L (ref 3.5–5.1)
Sodium: 134 mmol/L — ABNORMAL LOW (ref 135–145)
Total Bilirubin: 1.2 mg/dL (ref 0.0–1.2)
Total Protein: 6.7 g/dL (ref 6.5–8.1)

## 2023-08-15 LAB — TROPONIN I (HIGH SENSITIVITY)
Troponin I (High Sensitivity): 14 ng/L (ref <18)
Troponin I (High Sensitivity): 14 ng/L (ref <18)

## 2023-08-15 LAB — CBC
HCT: 50 % (ref 39.0–52.0)
Hemoglobin: 16.5 g/dL (ref 13.0–17.0)
MCH: 29.9 pg (ref 26.0–34.0)
MCHC: 33 g/dL (ref 30.0–36.0)
MCV: 90.7 fL (ref 80.0–100.0)
Platelets: 289 10*3/uL (ref 150–400)
RBC: 5.51 MIL/uL (ref 4.22–5.81)
RDW: 14.6 % (ref 11.5–15.5)
WBC: 14.8 10*3/uL — ABNORMAL HIGH (ref 4.0–10.5)
nRBC: 0 % (ref 0.0–0.2)

## 2023-08-15 LAB — LIPASE, BLOOD: Lipase: 27 U/L (ref 11–51)

## 2023-08-15 MED ORDER — SODIUM CHLORIDE 0.9 % IV BOLUS
500.0000 mL | Freq: Once | INTRAVENOUS | Status: AC
  Administered 2023-08-15: 500 mL via INTRAVENOUS

## 2023-08-15 NOTE — ED Triage Notes (Signed)
 The pt is c/o not feeling well for one week  no pain anywhere now no temp  sl nausea only

## 2023-08-15 NOTE — ED Notes (Signed)
 Pt to CT

## 2023-08-15 NOTE — ED Provider Notes (Signed)
 [facility] [facility] Provider Note   CSN: [id number] Arrival date & time: 08/15/23  1922     History  Chief Complaint  Patient presents with   not feeling well    Stanley Wells is a 78 y.o. male.  78 year old male with hx of CAD (s/p CABG x5), Afib (on Eliquis ), sCHF (LVEF 50-55% in October 2024), HTN, HLD, and CVA presents to the ED for evaluation of generalized weakness and diarrhea. States that symptoms began after eating at a restaurant on Sunday evening. That night patient came home and developed nausea with diarrhea. Had diarrhea q30-60 minutes for the next few days. Denies any melena or hematochezia with symptoms. No vomiting, fevers. Has had minimal PO intake this week. States last had diarrhea 2-3 days ago. Currently reporting nondescript, generalized abdominal discomfort. Hx of prior appendectomy.  The history is provided by the patient and a relative. No language interpreter was used.       Home Medications Prior to Admission medications   Medication Sig Start Date End Date Taking? Authorizing Provider  apixaban  (ELIQUIS ) 5 MG TABS tablet Take 1 tablet (5 mg total) by mouth 2 (two) times daily. 05/27/23   Rolan Ezra RAMAN, MD  aspirin  EC 81 MG EC tablet Take 1 tablet (81 mg total) by mouth daily. Swallow whole. 01/20/21   Rojelio Nest, DO  isosorbide  mononitrate (IMDUR ) 30 MG 24 hr tablet Take 1 tablet (30 mg total) by mouth daily. 09/16/22   Rolan Ezra RAMAN, MD  metoprolol  succinate (TOPROL -XL) 50 MG 24 hr tablet Take 1 tablet (50 mg total) by mouth 2 (two) times daily. Take with or immediately following a meal. 06/11/23   Rolan Ezra RAMAN, MD  rosuvastatin  (CRESTOR ) 40 MG tablet Take 1 tablet (40 mg total) by mouth daily. 09/16/22   Rolan Ezra RAMAN, MD  sacubitril -valsartan  (ENTRESTO ) 97-103 MG Take 1 tablet by mouth 2 (two) times daily. 09/16/22   Rolan Ezra RAMAN, MD      Allergies    Patient has no known allergies.    Review  of Systems   Review of Systems Ten systems reviewed and are negative for acute change, except as noted in the HPI.    Physical Exam Updated Vital Signs BP 114/76   Pulse (!) 51   Temp 97.7 F (36.5 C)   Resp 19   Ht 5' 11 (1.803 m)   Wt 93.4 kg   SpO2 100%   BMI 28.72 kg/m   Physical Exam Vitals and nursing note reviewed.  Constitutional:      General: He is not in acute distress.    Appearance: He is well-developed. He is not diaphoretic.     Comments: Nontoxic appearing and in NAD  HENT:     Head: Normocephalic and atraumatic.  Eyes:     General: No scleral icterus.    Conjunctiva/sclera: Conjunctivae normal.  Cardiovascular:     Rate and Rhythm: Normal rate and regular rhythm.     Pulses: Normal pulses.  Pulmonary:     Effort: Pulmonary effort is normal. No respiratory distress.     Comments: Respirations even and unlabored Abdominal:     Palpations: Abdomen is soft. There is no mass.     Tenderness: There is no guarding.     Comments: Abdomen soft, nondistended. Mild TTP to the right side of the abdomen. No guarding, masses, peritoneal signs.  Musculoskeletal:        General: Normal range of motion.  Cervical back: Normal range of motion.  Skin:    General: Skin is warm and dry.     Coloration: Skin is not pale.     Findings: No erythema or rash.  Neurological:     Mental Status: He is alert and oriented to person, place, and time.     Coordination: Coordination normal.  Psychiatric:        Behavior: Behavior normal.     ED Results / Procedures / Treatments   Labs (all labs ordered are listed, but only abnormal results are displayed) Labs Reviewed  COMPREHENSIVE METABOLIC PANEL - Abnormal; Notable for the following components:      Result Value   Sodium 134 (*)    CO2 14 (*)    Glucose, Bld 164 (*)    BUN 112 (*)    Creatinine, Ser 2.64 (*)    Calcium  8.5 (*)    GFR, Estimated 24 (*)    All other components within normal limits  CBC -  Abnormal; Notable for the following components:   WBC 14.8 (*)    All other components within normal limits  URINALYSIS, ROUTINE W REFLEX MICROSCOPIC - Abnormal; Notable for the following components:   APPearance HAZY (*)    All other components within normal limits  CBC - Abnormal; Notable for the following components:   WBC 15.2 (*)    All other components within normal limits  BASIC METABOLIC PANEL - Abnormal; Notable for the following components:   Chloride 113 (*)    CO2 15 (*)    Glucose, Bld 143 (*)    BUN 110 (*)    Creatinine, Ser 2.32 (*)    Calcium  8.0 (*)    GFR, Estimated 28 (*)    All other components within normal limits  RESP PANEL BY RT-PCR (RSV, FLU A&B, COVID)  RVPGX2  LIPASE, BLOOD  TROPONIN I (HIGH SENSITIVITY)  TROPONIN I (HIGH SENSITIVITY)    EKG EKG Interpretation Date/Time:  Saturday August 15 2023 19:33:22 EST Ventricular Rate:  67 PR Interval:  188 QRS Duration:  100 QT Interval:  398 QTC Calculation: 420 R Axis:   34  Text Interpretation: Sinus rhythm with occasional Premature ventricular complexes Nonspecific ST abnormality Abnormal ECG When compared with ECG of 28-Apr-2023 11:16, Premature atrial complexes are no longer present Confirmed by Raford Lenis ([id number]) on 08/16/2023 12:23:16 AM  Radiology CT ABDOMEN PELVIS WO CONTRAST Result Date: 08/15/2023 CLINICAL DATA:  Abdominal pain, acute, nonlocalized Pt states he has felt off the past week, some abdominal pain EXAM: CT ABDOMEN AND PELVIS WITHOUT CONTRAST TECHNIQUE: Multidetector CT imaging of the abdomen and pelvis was performed following the standard protocol without IV contrast. RADIATION DOSE REDUCTION: This exam was performed according to the departmental dose-optimization program which includes automated exposure control, adjustment of the mA and/or kV according to patient size and/or use of iterative reconstruction technique. COMPARISON:  None Available. FINDINGS: Lower chest: Coronary artery  calcification.  No acute abnormality. Hepatobiliary: Chronic vague hypodensity of the right hepatic lobe likely benign in etiology (3:18). No gallstones, gallbladder wall thickening, or pericholecystic fluid. No biliary dilatation. Pancreas: Diffusely atrophic. No focal lesion. Otherwise normal pancreatic contour. No surrounding inflammatory changes. No main pancreatic ductal dilatation. Spleen: Normal in size without focal abnormality. Adrenals/Urinary Tract: No adrenal nodule bilaterally. Slight hyperplasia of the adrenal glands, left greater than right. No nephrolithiasis and no hydronephrosis. Fluid density lesion likely likely represent simple renal cysts. Simple renal cysts, in the absence of clinically indicated  signs/symptoms, require no independent follow-up. No ureterolithiasis or hydroureter. No nephroureterolithiasis. Calcifications associated with the kidneys are likely vascular. The urinary bladder is unremarkable. Stomach/Bowel: Stomach is within normal limits. No evidence of bowel wall thickening or dilatation. Colonic diverticulosis. Status post appendectomy. Vascular/Lymphatic: Stable in size infrarenal abdominal aorta caliber measuring up to 3 cm. Severe atherosclerotic plaque of the aorta and its branches. No abdominal, pelvic, or inguinal lymphadenopathy. Reproductive: Prostatomegaly. Other: No intraperitoneal free fluid. No intraperitoneal free gas. No organized fluid collection. Musculoskeletal: No abdominal wall hernia or abnormality. No suspicious lytic or blastic osseous lesions. No acute displaced fracture. L3-L4 intervertebral disc space vacuum phenomenon. Bilateral L5 pars interarticularis defects with grade 1 anterolisthesis of L5 on S1. IMPRESSION: 1. Colonic diverticulosis with no acute diverticulitis. 2. Prostatomegaly. 3. Stable in size aneurysmal 3 cm infrarenal abdominal aorta. Recommend follow-up ultrasound every 3 years. (Ref.: J Vasc Surg. 2018; 67:2-77 and J Am Coll Radiol  2013;10(10):789-794.) 4. Aortic Atherosclerosis (ICD10-I70.0)-severe. 5. Nonspecific hyperplasia of the adrenal glands. Electronically Signed   By: Morgane  Naveau M.D.   On: 08/15/2023 23:36    Procedures Procedures    Medications Ordered in ED Medications  lactated ringers  infusion ( Intravenous New Bag/Given 08/16/23 0444)  aspirin  EC tablet 81 mg (has no administration in time range)  rosuvastatin  (CRESTOR ) tablet 40 mg (has no administration in time range)  apixaban  (ELIQUIS ) tablet 5 mg (has no administration in time range)  sodium chloride  0.9 % bolus 500 mL (0 mLs Intravenous Stopped 08/16/23 0204)  sodium chloride  0.9 % bolus 500 mL (0 mLs Intravenous Stopped 08/16/23 0314)    ED Course/ Medical Decision Making/ A&P Clinical Course as of 08/16/23 0548  [place] Aug 16, 2023  0022 Case discussed with Dr. Ricky of the hospitalist service who will assess patient in the ED for admission. [KH]    Clinical Course User Index [KH] Keith Sor, PA-C                                 Medical Decision Making Amount and/or Complexity of Data Reviewed Radiology: ordered.  Risk Decision regarding hospitalization.   This patient presents to the ED for concern of generalized weakness and diarrhea, this involves an extensive number of treatment options, and is a complaint that carries with it a high risk of complications and morbidity.  The differential diagnosis includes viral illness vs diverticulitis vs anemia vs dehydration   Co morbidities that complicate the patient evaluation  CAD Afib CHF   Additional history obtained:  Additional history obtained from son, at bedside External records from outside source obtained and reviewed including echocardiogram from 05/2023 notable for EF of 50-55%   Lab Tests:  I Ordered, and personally interpreted labs.  The pertinent results include:  WBC 15.2, CO2 14, Creatinine 2.64 (baseline ~1)   Imaging Studies ordered:  I ordered imaging  studies including CT abdomen/pelvis w/o contrast  I independently visualized and interpreted imaging which showed no acute process in the abdomen or pelvis. I agree with the radiologist interpretation   Cardiac Monitoring:  The patient was maintained on a cardiac monitor.  I personally viewed and interpreted the cardiac monitored which showed an underlying rhythm of: NSR   Medicines ordered and prescription drug management:  I ordered medication including IVF for rehydration  Reevaluation of the patient after these medicines showed that the patient stayed the same I have reviewed the patients home medicines and  have made adjustments as needed   Test Considered:  GI pathogen panel   Consultations Obtained:  I requested consultation with Dr. Ricky of [facility] and discussed lab and imaging findings as well as pertinent plan - they will assess the patient in the ED for admission   Problem List / ED Course:  As above   Reevaluation:  After the interventions noted above, I reevaluated the patient and found that they have :stayed the same   Social Determinants of Health:  Lives with spouse   Dispostion:  After consideration of the diagnostic results and the patients response to treatment, I feel that the patent would benefit from admission for management of acidosis and AKI. Started on IV hydration. No additional complaints at this time. VSS. Hospitalist to admit.          Final Clinical Impression(s) / ED Diagnoses Final diagnoses:  AKI (acute kidney injury) (HCC)  Normal anion gap metabolic acidosis    Rx / DC Orders ED Discharge Orders     None         Keith Sor, PA-C 08/16/23 0548    Raford Lenis, MD 08/16/23 9292    Raford Lenis, MD 08/16/23 [phone]

## 2023-08-16 DIAGNOSIS — F17210 Nicotine dependence, cigarettes, uncomplicated: Secondary | ICD-10-CM | POA: Diagnosis present

## 2023-08-16 DIAGNOSIS — K529 Noninfective gastroenteritis and colitis, unspecified: Secondary | ICD-10-CM | POA: Diagnosis present

## 2023-08-16 DIAGNOSIS — Z7982 Long term (current) use of aspirin: Secondary | ICD-10-CM | POA: Diagnosis not present

## 2023-08-16 DIAGNOSIS — I5022 Chronic systolic (congestive) heart failure: Secondary | ICD-10-CM | POA: Diagnosis present

## 2023-08-16 DIAGNOSIS — Z8673 Personal history of transient ischemic attack (TIA), and cerebral infarction without residual deficits: Secondary | ICD-10-CM

## 2023-08-16 DIAGNOSIS — I2510 Atherosclerotic heart disease of native coronary artery without angina pectoris: Secondary | ICD-10-CM | POA: Diagnosis present

## 2023-08-16 DIAGNOSIS — Z87442 Personal history of urinary calculi: Secondary | ICD-10-CM | POA: Diagnosis not present

## 2023-08-16 DIAGNOSIS — N179 Acute kidney failure, unspecified: Principal | ICD-10-CM | POA: Diagnosis present

## 2023-08-16 DIAGNOSIS — E785 Hyperlipidemia, unspecified: Secondary | ICD-10-CM | POA: Diagnosis present

## 2023-08-16 DIAGNOSIS — E663 Overweight: Secondary | ICD-10-CM | POA: Diagnosis present

## 2023-08-16 DIAGNOSIS — I4819 Other persistent atrial fibrillation: Secondary | ICD-10-CM | POA: Diagnosis present

## 2023-08-16 DIAGNOSIS — Z809 Family history of malignant neoplasm, unspecified: Secondary | ICD-10-CM | POA: Diagnosis not present

## 2023-08-16 DIAGNOSIS — I7143 Infrarenal abdominal aortic aneurysm, without rupture: Secondary | ICD-10-CM

## 2023-08-16 DIAGNOSIS — Z951 Presence of aortocoronary bypass graft: Secondary | ICD-10-CM | POA: Diagnosis not present

## 2023-08-16 DIAGNOSIS — E861 Hypovolemia: Secondary | ICD-10-CM | POA: Diagnosis not present

## 2023-08-16 DIAGNOSIS — Z7901 Long term (current) use of anticoagulants: Secondary | ICD-10-CM | POA: Diagnosis not present

## 2023-08-16 DIAGNOSIS — I255 Ischemic cardiomyopathy: Secondary | ICD-10-CM | POA: Diagnosis present

## 2023-08-16 DIAGNOSIS — I9589 Other hypotension: Secondary | ICD-10-CM | POA: Diagnosis present

## 2023-08-16 DIAGNOSIS — E872 Acidosis, unspecified: Secondary | ICD-10-CM

## 2023-08-16 DIAGNOSIS — E860 Dehydration: Secondary | ICD-10-CM | POA: Diagnosis present

## 2023-08-16 DIAGNOSIS — Z66 Do not resuscitate: Secondary | ICD-10-CM | POA: Diagnosis present

## 2023-08-16 DIAGNOSIS — Z6828 Body mass index (BMI) 28.0-28.9, adult: Secondary | ICD-10-CM | POA: Diagnosis not present

## 2023-08-16 DIAGNOSIS — Z1152 Encounter for screening for COVID-19: Secondary | ICD-10-CM | POA: Diagnosis not present

## 2023-08-16 DIAGNOSIS — I110 Hypertensive heart disease with heart failure: Secondary | ICD-10-CM | POA: Diagnosis present

## 2023-08-16 DIAGNOSIS — D72829 Elevated white blood cell count, unspecified: Secondary | ICD-10-CM | POA: Diagnosis present

## 2023-08-16 LAB — BASIC METABOLIC PANEL
Anion gap: 8 (ref 5–15)
Anion gap: 7 (ref 5–15)
BUN: 110 mg/dL — ABNORMAL HIGH (ref 8–23)
BUN: 95 mg/dL — ABNORMAL HIGH (ref 8–23)
CO2: 15 mmol/L — ABNORMAL LOW (ref 22–32)
CO2: 19 mmol/L — ABNORMAL LOW (ref 22–32)
Calcium: 8 mg/dL — ABNORMAL LOW (ref 8.9–10.3)
Calcium: 8.5 mg/dL — ABNORMAL LOW (ref 8.9–10.3)
Chloride: 113 mmol/L — ABNORMAL HIGH (ref 98–111)
Chloride: 109 mmol/L (ref 98–111)
Creatinine, Ser: 2.32 mg/dL — ABNORMAL HIGH (ref 0.61–1.24)
Creatinine, Ser: 1.99 mg/dL — ABNORMAL HIGH (ref 0.61–1.24)
GFR, Estimated: 28 mL/min — ABNORMAL LOW (ref >60)
GFR, Estimated: 34 mL/min — ABNORMAL LOW (ref >60)
Glucose, Bld: 143 mg/dL — ABNORMAL HIGH (ref 70–99)
Glucose, Bld: 105 mg/dL — ABNORMAL HIGH (ref 70–99)
Potassium: 3.8 mmol/L (ref 3.5–5.1)
Potassium: 4.9 mmol/L (ref 3.5–5.1)
Sodium: 136 mmol/L (ref 135–145)
Sodium: 135 mmol/L (ref 135–145)

## 2023-08-16 LAB — CBC
HCT: 47.5 % (ref 39.0–52.0)
Hemoglobin: 15.9 g/dL (ref 13.0–17.0)
MCH: 29.9 pg (ref 26.0–34.0)
MCHC: 33.5 g/dL (ref 30.0–36.0)
MCV: 89.5 fL (ref 80.0–100.0)
Platelets: 225 10*3/uL (ref 150–400)
RBC: 5.31 MIL/uL (ref 4.22–5.81)
RDW: 14.7 % (ref 11.5–15.5)
WBC: 15.2 10*3/uL — ABNORMAL HIGH (ref 4.0–10.5)
nRBC: 0 % (ref 0.0–0.2)

## 2023-08-16 LAB — RESP PANEL BY RT-PCR (RSV, FLU A&B, COVID)  RVPGX2
Influenza A by PCR: NEGATIVE
Influenza B by PCR: NEGATIVE
Resp Syncytial Virus by PCR: NEGATIVE
SARS Coronavirus 2 by RT PCR: NEGATIVE

## 2023-08-16 MED ORDER — LACTATED RINGERS IV SOLN: INTRAVENOUS | Status: DC

## 2023-08-16 MED ORDER — APIXABAN 5 MG PO TABS
5.0000 mg | ORAL_TABLET | Freq: Two times a day (BID) | ORAL | Status: DC
  Administered 2023-08-16 – 2023-08-18 (×5): 5 mg via ORAL
  Filled 2023-08-16 (×5): qty 1

## 2023-08-16 MED ORDER — METOPROLOL SUCCINATE ER 25 MG PO TB24
12.5000 mg | ORAL_TABLET | Freq: Two times a day (BID) | ORAL | Status: DC
  Administered 2023-08-16: 12.5 mg via ORAL
  Filled 2023-08-16 (×2): qty 1

## 2023-08-16 MED ORDER — ROSUVASTATIN CALCIUM 20 MG PO TABS
40.0000 mg | ORAL_TABLET | Freq: Every day | ORAL | Status: DC
  Administered 2023-08-16 – 2023-08-18 (×3): 40 mg via ORAL
  Filled 2023-08-16 (×3): qty 2

## 2023-08-16 MED ORDER — SODIUM CHLORIDE 0.9 % IV BOLUS
500.0000 mL | Freq: Once | INTRAVENOUS | Status: AC
  Administered 2023-08-16: 500 mL via INTRAVENOUS

## 2023-08-16 MED ORDER — METOPROLOL SUCCINATE ER 25 MG PO TB24
12.5000 mg | ORAL_TABLET | Freq: Two times a day (BID) | ORAL | Status: DC
  Administered 2023-08-17 – 2023-08-18 (×3): 12.5 mg via ORAL
  Filled 2023-08-16 (×3): qty 1

## 2023-08-16 MED ORDER — ASPIRIN 81 MG PO TBEC
81.0000 mg | DELAYED_RELEASE_TABLET | Freq: Every day | ORAL | Status: DC
  Administered 2023-08-16 – 2023-08-18 (×3): 81 mg via ORAL
  Filled 2023-08-16 (×3): qty 1

## 2023-08-16 NOTE — ED Notes (Signed)
 Assumed care of pt, found him alert and oriented in bed w/ visitor bedside.  Pt indicated abdominal pain X1 week that started w/ "horrible diarrhea"  Pt states he feels fine now "when I 'm not moving."

## 2023-08-16 NOTE — Assessment & Plan Note (Signed)
-  secondary to GI loss and AKI -on continuous IV fluid

## 2023-08-16 NOTE — Assessment & Plan Note (Signed)
-  continue aspirin and Eliquis

## 2023-08-16 NOTE — Assessment & Plan Note (Addendum)
-  Recent TTE on 05/2023 with EF of 50-55%, normal diastolic, trivial MR.  -Stable. Not on diuretics at home

## 2023-08-16 NOTE — Assessment & Plan Note (Addendum)
-  Incidental finding on CT abdomen pelvis-recommend ultrasound surveillance every 3 years

## 2023-08-16 NOTE — Assessment & Plan Note (Signed)
-  Cr 2.64 from prior 1.17 -Keep on continuous IV fluid overnight and follow trend in the morning

## 2023-08-16 NOTE — Assessment & Plan Note (Signed)
-  Diarrhea likely viral in etiology. Has already been improving.

## 2023-08-16 NOTE — Assessment & Plan Note (Signed)
-  BP has been soft with SBP in the high 90s -hold Entresto, metoprolol and Imdur for now while on IV fluids

## 2023-08-16 NOTE — Progress Notes (Signed)
 PROGRESS NOTE   Stanley Wells  FMW:[id number]    DOB: 02/20/1946    DOA: 08/15/2023  PCP: [facility]   I have briefly reviewed patients previous medical records in [facility].  Chief Complaint  Patient presents with   not feeling well    Brief Hospital Course:  78 year old married male, lives alone, independent, medical history significant for CAD s/p CABG, ischemic cardiomyopathy, chronic systolic CHF, severe functional regurgitation persistent atrial fibrillation on Eliquis , CVA, presented to ED on 9/12 with complaints of intractable nausea, dry heaves, poor oral intake, self-limited diarrhea/pain generalized weakness.  Admitted for suspected acute gastroenteritis versus food poisoning, associated dehydration, acute kidney injury and generalized weakness.   Assessment & Plan:  Principal Problem:   AKI (acute kidney injury) (HCC) Active Problems:   Hypotension   Persistent atrial fibrillation (HCC)   Chronic systolic CHF (congestive heart failure) (HCC)   Metabolic acidosis   Infrarenal abdominal aortic aneurysm (AAA) without rupture (HCC)   History of CVA (cerebrovascular accident)   Gastroenteritis   Acute gastroenteritis versus food poisoning Improved or even resolved.  Tolerating diet. CT abdomen without acute findings. Monitor.  If he has recurrent diarrhea then can consider stool testing for GI panel.  Dehydration/hypotension Secondary to GI losses and poor oral intake.  Hypotension resolved. Continue IV fluids x 24 hours, increased oral fluid intake.  Acute kidney injury Creatinine 1.17 on 04/28/2023.  Presented with creatinine 2.64. Secondary to dehydration/may have had an element of mild ATN/had soft blood pressures on arrival and Entresto  Improving.  Creatinine down to 2.32. Continue IV fluids, avoid nephrotoxic medications and hypotension Trend daily BMP  Non-anion gap metabolic acidosis Serum bicarbonate 14 > 15 Secondary to GI  losses. Continue IV fluids/LR and follow BMP later this afternoon and tomorrow.  History of CVA Continue aspirin  and this  Incidental infrarenal AAA without rupture Incidental finding on CT A/P As per recommendations, ultrasound surveillance every 3 years  Chronic systolic CHF History send TTE on 05/2023 showed LVEF of 50-55%, trivial EF seems to have normalized. Currently treated.  Not on diuretics PTA.  Hold Entresto  and Imdur .  Persistent atrial fibrillation Currently in sinus rhythm on telemetry.  Continue Eliquis .  Beta-blockers were held, resume low-dose atenolol.  Leukocytosis Suspect reactive.  Follow CBCs.  Body mass index is 28.72 kg/m.   DVT prophylaxis:   Apixaban    Code Status: Limited: Do not attempt resuscitation (DNR) -DNR-LIMITED -Do Not Intubate/DNI :  Family Communication: Son at bedside Disposition:  Status is: Observation The patient will require care spanning > 2 midnights and should be moved to inpatient because: IV fluids, close monitoring of BMP     Consultants:     Procedures:     Antimicrobials:      Subjective:  As per patient and son at bedside, about 19 of the side effects including patient had dinner at a restaurant in [place] city on 08/08/2022.  Overnight that night, patient, patient's granddaughter and her boyfriend all developed diarrhea.  Patient reports intractable nausea, dry heaves but no.  This was associated with poor oral intake.  Had couple of days of multiple diarrhea which stopped 1/8.  However he had ongoing nausea, poor oral intake, abdominal cramping and generalized weakness and hence presented to the ED.  Since ED arrival, no nausea, tolerated regular food, some abdominal cramping but better, had a BM in the ED without diarrhea but mostly passing gas.  Objective:   Vitals:   08/16/23 0230  08/16/23 0245 08/16/23 0500 08/16/23 0810  BP: 112/71 117/78 114/76 117/74  Pulse: (!) 58 62 (!) 51 (!) 58  Resp: 16 18 19  (!) 23   Temp:   97.7 F (36.5 C) 98 F (36.7 C)  TempSrc:    Oral  SpO2: 100% 100% 100% 98%  Weight:      Height:        General exam: Elderly male, moderately built and nourished lying comfortably propped up in bed without distress.  Oral mucosa with borderline hydration. Respiratory system: Clear to auscultation. Respiratory effort normal. Cardiovascular system: S1 & S2 heard, RRR. No JVD, murmurs, rubs, gallops or clicks. No pedal edema.  Telemetry personally reviewed: Sinus rhythm without arrhythmias. Gastrointestinal system: Abdomen is nondistended, soft and nontender. No organomegaly or masses felt. Normal bowel sounds heard. Central nervous system: Alert and oriented. No focal neurological deficits. Extremities: Symmetric 5 x 5 power. Skin: No rashes, lesions or ulcers Psychiatry: Judgement and insight appear normal. Mood & affect appropriate.     Data Reviewed:   I have personally reviewed following labs and imaging studies   CBC: Recent Labs  Lab 08/15/23 1953 08/16/23 0436  WBC 14.8* 15.2*  HGB 16.5 15.9  HCT 50.0 47.5  MCV 90.7 89.5  PLT 289 225    Basic Metabolic Panel: Recent Labs  Lab 08/15/23 1953 08/16/23 0436  NA 134* 136  K 4.2 3.8  CL 107 113*  CO2 14* 15*  GLUCOSE 164* 143*  BUN 112* 110*  CREATININE 2.64* 2.32*  CALCIUM  8.5* 8.0*    Liver Function Tests: Recent Labs  Lab 08/15/23 1953  AST 23  ALT 38  ALKPHOS 63  BILITOT 1.2  PROT 6.7  ALBUMIN 3.9    CBG: No results for input(s): GLUCAP in the last 168 hours.  Microbiology Studies:   Recent Results (from the past 240 hours)  Resp panel by RT-PCR (RSV, Flu A&B, Covid) Anterior Nasal Swab     Status: None   Collection Time: 08/15/23 10:34 PM   Specimen: Anterior Nasal Swab  Result Value Ref Range Status   SARS Coronavirus 2 by RT PCR NEGATIVE NEGATIVE Final   Influenza A by PCR NEGATIVE NEGATIVE Final   Influenza B by PCR NEGATIVE NEGATIVE Final    Comment: (NOTE) The Xpert  Xpress SARS-CoV-2/FLU/RSV plus assay is intended as an aid in the diagnosis of influenza from Nasopharyngeal swab specimens and should not be used as a sole basis for treatment. Nasal washings and aspirates are unacceptable for Xpert Xpress SARS-CoV-2/FLU/RSV testing.  Fact Sheet for Patients: bloggercourse.com  Fact Sheet for Healthcare Providers: seriousbroker.it  This test is not yet approved or cleared by the [place]  FDA and has been authorized for detection and/or diagnosis of SARS-CoV-2 by FDA under an Emergency Use Authorization (EUA). This EUA will remain in effect (meaning this test can be used) for the duration of the COVID-19 declaration under Section 564(b)(1) of the Act, 21 U.S.C. section 360bbb-3(b)(1), unless the authorization is terminated or revoked.     Resp Syncytial Virus by PCR NEGATIVE NEGATIVE Final    Comment: (NOTE) Fact Sheet for Patients: bloggercourse.com  Fact Sheet for Healthcare Providers: seriousbroker.it  This test is not yet approved or cleared by the [place]  FDA and has been authorized for detection and/or diagnosis of SARS-CoV-2 by FDA under an Emergency Use Authorization (EUA). This EUA will remain in effect (meaning this test can be used) for the duration of the COVID-19 declaration under Section 564(b)(1)  of the Act, 21 U.S.C. section 360bbb-3(b)(1), unless the authorization is terminated or revoked.  Performed at [facility], [address] [place], [place] [address]     Radiology Studies:  CT ABDOMEN PELVIS WO CONTRAST Result Date: 08/15/2023 CLINICAL DATA:  Abdominal pain, acute, nonlocalized Pt states he has felt off the past week, some abdominal pain EXAM: CT ABDOMEN AND PELVIS WITHOUT CONTRAST TECHNIQUE: Multidetector CT imaging of the abdomen and pelvis was performed following the standard protocol without IV  contrast. RADIATION DOSE REDUCTION: This exam was performed according to the departmental dose-optimization program which includes automated exposure control, adjustment of the mA and/or kV according to patient size and/or use of iterative reconstruction technique. COMPARISON:  None Available. FINDINGS: Lower chest: Coronary artery calcification.  No acute abnormality. Hepatobiliary: Chronic vague hypodensity of the right hepatic lobe likely benign in etiology (3:18). No gallstones, gallbladder wall thickening, or pericholecystic fluid. No biliary dilatation. Pancreas: Diffusely atrophic. No focal lesion. Otherwise normal pancreatic contour. No surrounding inflammatory changes. No main pancreatic ductal dilatation. Spleen: Normal in size without focal abnormality. Adrenals/Urinary Tract: No adrenal nodule bilaterally. Slight hyperplasia of the adrenal glands, left greater than right. No nephrolithiasis and no hydronephrosis. Fluid density lesion likely likely represent simple renal cysts. Simple renal cysts, in the absence of clinically indicated signs/symptoms, require no independent follow-up. No ureterolithiasis or hydroureter. No nephroureterolithiasis. Calcifications associated with the kidneys are likely vascular. The urinary bladder is unremarkable. Stomach/Bowel: Stomach is within normal limits. No evidence of bowel wall thickening or dilatation. Colonic diverticulosis. Status post appendectomy. Vascular/Lymphatic: Stable in size infrarenal abdominal aorta caliber measuring up to 3 cm. Severe atherosclerotic plaque of the aorta and its branches. No abdominal, pelvic, or inguinal lymphadenopathy. Reproductive: Prostatomegaly. Other: No intraperitoneal free fluid. No intraperitoneal free gas. No organized fluid collection. Musculoskeletal: No abdominal wall hernia or abnormality. No suspicious lytic or blastic osseous lesions. No acute displaced fracture. L3-L4 intervertebral disc space vacuum phenomenon.  Bilateral L5 pars interarticularis defects with grade 1 anterolisthesis of L5 on S1. IMPRESSION: 1. Colonic diverticulosis with no acute diverticulitis. 2. Prostatomegaly. 3. Stable in size aneurysmal 3 cm infrarenal abdominal aorta. Recommend follow-up ultrasound every 3 years. (Ref.: J Vasc Surg. 2018; 67:2-77 and J Am Coll Radiol 2013;10(10):789-794.) 4. Aortic Atherosclerosis (ICD10-I70.0)-severe. 5. Nonspecific hyperplasia of the adrenal glands. Electronically Signed   By: Morgane  Naveau M.D.   On: 08/15/2023 23:36    Scheduled Meds:    apixaban   5 mg Oral BID   aspirin  EC  81 mg Oral Daily   rosuvastatin   40 mg Oral Daily    Continuous Infusions:    lactated ringers  75 mL/hr at 08/16/23 0444     LOS: 0 days     Trenda Mar, MD,  FACP, [facility], [facility], [facility]   [facility] [place]      To contact the attending provider between 7A-7P or the covering provider during after hours 7P-7A, please log into the web site [email or web address] and access using universal [place] password for that web site. If you do not have the password, please call the hospital operator.  08/16/2023, 11:08 AM

## 2023-08-16 NOTE — H&P (Addendum)
 History and Physical    Patient: Stanley Wells DOB: 1945/08/20 DOA: 08/15/2023 DOS: the patient was seen and examined on 08/16/2023 PCP: [facility]  Patient coming from: Home  Chief Complaint:  Chief Complaint  Patient presents with   not feeling well   HPI: Stanley Wells is a 78 y.o. male with medical history significant of CAD s/p CABG, ischemic cardiomyopathy/chronic systolic CHF, severe functional mitral regurgitation, persistent atrial fibrillation on Eliquis , CVA who presents with persistent diarrhea and weakness.  Had persistent diarrhea multiple times a day for the past week although this is improving. Had family members with the same symptoms after eating at a restaurant together. Has felt diffuse abdominal pain. Had decrease intake over the past week. Continue to take his medications including blood pressure medications.   On arrival to the ED, he was borderline hypotensive BP of 94/64, heart rate around 50 to 60s.  CBC with leukocytosis of 14.8 K, hemoglobin 16.5  CMP with sodium 134, CO2 14, AKI with creatinine 2.64 from prior 1.17.  Anion gap within normal limits.  CT abdomen and pelvis obtained which is negative for any acute findings but did have incidental finding of infrarenal abdominal aortic aneurysm.  Hospitalist consulted for management of AKI.  Review of Systems: As mentioned in the history of present illness. All other systems reviewed and are negative. Past Medical History:  Diagnosis Date   Atrial flutter with rapid ventricular response (HCC) 07/23/2018   CAD (coronary artery disease)    a. s/p CABG 2003  b. s/p acute MI prior to CABG w/ EF <30%   Cardiomyopathy, ischemic: EF 20-25%    Cholecystitis    Chronic combined systolic and diastolic CHF (congestive heart failure) (HCC)    Chronic systolic CHF (congestive heart failure) (HCC)    a. 2D ECHO: 05/17/2014: EF 30-35%;  b. 07/2015 TEE: EF 20-25%, diff HK, sev MR,  sev dil LA w/o thrombus, mod reduced RV fxn, mod dil RA, mod TR.   Hyperlipidemia    Hypertensive heart disease    Kidney stones X 1   passed it (08/02/2015)   OSA (obstructive sleep apnea) 10/11/2014   refuses to use CPAP   Persistent atrial fibrillation (HCC)    a. s/p succesful TEE/DCCV on 06/01/14.  b. on Eliquis ;  c. 07/2015 recurrent AF-->Failed DCCV.   S/P CABG x 5 02/16/2002   LIMA to LAD, SVG to D1, SVG to OM2, Sequential SVG to PDA-RPL, saphenous vein harvest from right thigh and lower leg   Severe mitral regurgitation    Functional, improved with improvement in his EF.   Stroke (HCC) 01/12/2021   Tricuspid regurgitation    Past Surgical History:  Procedure Laterality Date   ATRIAL FIBRILLATION ABLATION N/A 04/11/2020   Procedure: ATRIAL FIBRILLATION ABLATION;  Surgeon: Cindie Ole DASEN, MD;  Location: [facility] INVASIVE CV LAB;  Service: Cardiovascular;  Laterality: N/A;   CARDIAC CATHETERIZATION N/A 06/19/2014   Procedure: RIGHT/LEFT HEART CATH AND CORONARY/GRAFT ANGIOGRAPHY;  Surgeon: Debby DELENA Sor, MD;  Location: [facility] CATH LAB;  Service: Cardiovascular;  Laterality: N/A;   CARDIAC CATHETERIZATION  2003   CARDIAC CATHETERIZATION N/A 08/28/2015   Procedure: Right Heart Cath and Coronary/Graft Angiography;  Surgeon: Ezra GORMAN Shuck, MD;  Location: [facility] INVASIVE CV LAB;  Service: Cardiovascular;  Laterality: N/A;   CARDIOVERSION N/A 06/01/2014   Procedure: CARDIOVERSION;  Surgeon: Leim VEAR Moose, MD;  Location: [facility] ENDOSCOPY;  Service: Cardiovascular;  Laterality: N/A;   CARDIOVERSION  06/11/2015;  08/02/2015   CARDIOVERSION N/A 08/02/2015   Procedure: CARDIOVERSION;  Surgeon: Maude [name] Emmer, MD;  Location: [facility] ENDOSCOPY;  Service: Cardiovascular;  Laterality: N/A;   CARDIOVERSION N/A 10/22/2015   Procedure: CARDIOVERSION;  Surgeon: Ezra GORMAN Shuck, MD;  Location: [facility] ENDOSCOPY;  Service: Cardiovascular;  Laterality: N/A;   CARDIOVERSION N/A 03/15/2020   Procedure: CARDIOVERSION;   Surgeon: Shuck Ezra GORMAN, MD;  Location: [facility] ENDOSCOPY;  Service: Cardiovascular;  Laterality: N/A;   CARDIOVERSION N/A 05/14/2020   Procedure: CARDIOVERSION;  Surgeon: Mona Vinie [name], MD;  Location: [facility] ENDOSCOPY;  Service: Cardiovascular;  Laterality: N/A;   CORONARY ARTERY BYPASS GRAFT  02/16/2002   CABG X5   LAPAROSCOPIC APPENDECTOMY N/A 07/20/2016   Procedure: APPENDECTOMY LAPAROSCOPIC;  Surgeon: Donnice Lima, MD;  Location: [facility] OR;  Service: General;  Laterality: N/A;   TEE WITHOUT CARDIOVERSION N/A 06/01/2014   Procedure: TRANSESOPHAGEAL ECHOCARDIOGRAM (TEE);  Surgeon: Leim VEAR Moose, MD;  Location: [facility] ENDOSCOPY;  Service: Cardiovascular;  Laterality: N/A;   TEE WITHOUT CARDIOVERSION N/A 08/03/2015   Procedure: TRANSESOPHAGEAL ECHOCARDIOGRAM (TEE);  Surgeon: Leim VEAR Moose, MD;  Location: [facility] ENDOSCOPY;  Service: Cardiovascular;  Laterality: N/A;   Social History:  reports that he has been smoking cigarettes. He started smoking about 61 years ago. He has never used smokeless tobacco. He reports current alcohol use. He reports that he does not use drugs.  No Known Allergies  Family History  Problem Relation Age of Onset   Cancer Mother    Cancer Father    Hypertension Neg Hx    Stroke Neg Hx     Prior to Admission medications   Medication Sig Start Date End Date Taking? Authorizing Provider  apixaban  (ELIQUIS ) 5 MG TABS tablet Take 1 tablet (5 mg total) by mouth 2 (two) times daily. 05/27/23   Shuck Ezra GORMAN, MD  aspirin  EC 81 MG EC tablet Take 1 tablet (81 mg total) by mouth daily. Swallow whole. 01/20/21   Rojelio Nest, DO  isosorbide  mononitrate (IMDUR ) 30 MG 24 hr tablet Take 1 tablet (30 mg total) by mouth daily. 09/16/22   Shuck Ezra GORMAN, MD  metoprolol  succinate (TOPROL -XL) 50 MG 24 hr tablet Take 1 tablet (50 mg total) by mouth 2 (two) times daily. Take with or immediately following a meal. 06/11/23   Shuck Ezra GORMAN, MD  rosuvastatin  (CRESTOR ) 40 MG tablet Take  1 tablet (40 mg total) by mouth daily. 09/16/22   Shuck Ezra GORMAN, MD  sacubitril -valsartan  (ENTRESTO ) 97-103 MG Take 1 tablet by mouth 2 (two) times daily. 09/16/22   Shuck Ezra GORMAN, MD    Physical Exam: Vitals:   08/15/23 1936 08/15/23 2215 08/15/23 2310 08/15/23 2351  BP:  92/71  105/76  Pulse:  61 (!) 54 (!) 57  Resp:  16 19 17   Temp:  97.7 F (36.5 C)    SpO2:  100% 100% 100%  Weight: 93.4 kg     Height: 5' 11 (1.803 m)      Constitutional: NAD, calm, comfortable, well appearing elderly male lying in bed Eyes: lids and conjunctivae normal ENMT: Mucous membranes are moist.  Neck: normal, supple Respiratory: clear to auscultation bilaterally, no wheezing, no crackles. Normal respiratory effort. No accessory muscle use.  Cardiovascular: Regular rate and rhythm, no murmurs / rubs / gallops. No extremity edema. Abdomen: no tenderness, soft, non-distended, obese abdomen Musculoskeletal: no clubbing / cyanosis. No joint deformity upper and lower extremities. Normal muscle tone.  Skin: no rashes, lesions, ulcers. No induration Neurologic: CN  2-12 grossly intact. Strength 5/5 in all 4.  Psychiatric: Normal judgment and insight. Alert and oriented x 3. Normal mood.   Data Reviewed:  See HPI   Assessment and Plan: * AKI (acute kidney injury) (HCC) -Cr 2.64 from prior 1.17 -Keep on continuous IV fluid overnight and follow trend in the morning  Gastroenteritis -Diarrhea likely viral in etiology. Has already been improving.   History of CVA (cerebrovascular accident) -continue aspirin  and Eliquis   Infrarenal abdominal aortic aneurysm (AAA) without rupture (HCC) -Incidental finding on CT abdomen pelvis-recommend ultrasound surveillance every 3 years  Metabolic acidosis -secondary to GI loss and AKI -on continuous IV fluid  Chronic systolic CHF (congestive heart failure) (HCC) -Recent TTE on 05/2023 with EF of 50-55%, normal diastolic, trivial MR.  -Stable. Not on diuretics  at home  Persistent atrial fibrillation (HCC) -continue Eliquis  -holding metoprolol  with hypotension  Hypotension -BP has been soft with SBP in the high 90s -hold Entresto , metoprolol  and Imdur  for now while on IV fluids      Advance Care Planning:   Code Status: Limited: Do not attempt resuscitation (DNR) -DNR-LIMITED -Do Not Intubate/DNI    Consults: none  Family Communication: son at bedside  Severity of Illness: The appropriate patient status for this patient is OBSERVATION. Observation status is judged to be reasonable and necessary in order to provide the required intensity of service to ensure the patient's safety. The patient's presenting symptoms, physical exam findings, and initial radiographic and laboratory data in the context of their medical condition is felt to place them at decreased risk for further clinical deterioration. Furthermore, it is anticipated that the patient will be medically stable for discharge from the hospital within 2 midnights of admission.   Author: Alfrieda ONEIDA Pillar, DO 08/16/2023 1:42 AM  For on call review [email or web address].

## 2023-08-16 NOTE — Assessment & Plan Note (Signed)
-  continue Eliquis -holding metoprolol with hypotension

## 2023-08-16 NOTE — ED Notes (Signed)
 ED TO INPATIENT HANDOFF REPORT  ED Nurse Name and Phone #: [place] RN [phone]  S Name/Age/Gender Stanley Wells 78 y.o. male Room/Bed: 039C/039C  Code Status   Code Status: Limited: Do not attempt resuscitation (DNR) -DNR-LIMITED -Do Not Intubate/DNI   Home/SNF/Other Home Patient oriented to: self, place, time, and situation Is this baseline? Yes   Triage Complete: Triage complete  Chief Complaint AKI (acute kidney injury) (HCC) [N17.9] Acute gastroenteritis [K52.9]  Triage Note The pt is c/o not feeling well for one week  no pain anywhere now no temp  sl nausea only    Allergies No Known Allergies  Level of Care/Admitting Diagnosis ED Disposition     ED Disposition  Admit   Condition  --   Comment  Hospital Area: [facility] [[id number]]  Level of Care: Med-Surg [16]  May admit patient to [facility] or [facility] if equivalent level of care is available:: No  Covid Evaluation: Confirmed COVID Negative  Diagnosis: Acute gastroenteritis [171312]  Admitting Physician: RICKY ALFRIEDA DASEN [[id number]]  Attending Physician: JUDETH LULAS D [[id number]]  Certification:: I certify this patient will need inpatient services for at least 2 midnights  Expected Medical Readiness: 08/17/2023          B Medical/Surgery History Past Medical History:  Diagnosis Date   Atrial flutter with rapid ventricular response (HCC) 07/23/2018   CAD (coronary artery disease)    a. s/p CABG 2003  b. s/p acute MI prior to CABG w/ EF <30%   Cardiomyopathy, ischemic: EF 20-25%    Cholecystitis    Chronic combined systolic and diastolic CHF (congestive heart failure) (HCC)    Chronic systolic CHF (congestive heart failure) (HCC)    a. 2D ECHO: 05/17/2014: EF 30-35%;  b. 07/2015 TEE: EF 20-25%, diff HK, sev MR, sev dil LA w/o thrombus, mod reduced RV fxn, mod dil RA, mod TR.   Hyperlipidemia    Hypertensive heart disease    Kidney stones X 1   passed it (08/02/2015)   OSA  (obstructive sleep apnea) 10/11/2014   refuses to use CPAP   Persistent atrial fibrillation (HCC)    a. s/p succesful TEE/DCCV on 06/01/14.  b. on Eliquis ;  c. 07/2015 recurrent AF-->Failed DCCV.   S/P CABG x 5 02/16/2002   LIMA to LAD, SVG to D1, SVG to OM2, Sequential SVG to PDA-RPL, saphenous vein harvest from right thigh and lower leg   Severe mitral regurgitation    Functional, improved with improvement in his EF.   Stroke (HCC) 01/12/2021   Tricuspid regurgitation    Past Surgical History:  Procedure Laterality Date   ATRIAL FIBRILLATION ABLATION N/A 04/11/2020   Procedure: ATRIAL FIBRILLATION ABLATION;  Surgeon: Cindie Ole DASEN, MD;  Location: [facility] INVASIVE CV LAB;  Service: Cardiovascular;  Laterality: N/A;   CARDIAC CATHETERIZATION N/A 06/19/2014   Procedure: RIGHT/LEFT HEART CATH AND CORONARY/GRAFT ANGIOGRAPHY;  Surgeon: Debby DELENA Sor, MD;  Location: [facility] CATH LAB;  Service: Cardiovascular;  Laterality: N/A;   CARDIAC CATHETERIZATION  2003   CARDIAC CATHETERIZATION N/A 08/28/2015   Procedure: Right Heart Cath and Coronary/Graft Angiography;  Surgeon: Ezra GORMAN Shuck, MD;  Location: [facility] INVASIVE CV LAB;  Service: Cardiovascular;  Laterality: N/A;   CARDIOVERSION N/A 06/01/2014   Procedure: CARDIOVERSION;  Surgeon: Leim VEAR Moose, MD;  Location: [facility] ENDOSCOPY;  Service: Cardiovascular;  Laterality: N/A;   CARDIOVERSION  06/11/2015; 08/02/2015   CARDIOVERSION N/A 08/02/2015   Procedure: CARDIOVERSION;  Surgeon: Maude BROCKS Emmer, MD;  Location: [facility] ENDOSCOPY;  Service: Cardiovascular;  Laterality: N/A;   CARDIOVERSION N/A 10/22/2015   Procedure: CARDIOVERSION;  Surgeon: Ezra GORMAN Shuck, MD;  Location: [facility] ENDOSCOPY;  Service: Cardiovascular;  Laterality: N/A;   CARDIOVERSION N/A 03/15/2020   Procedure: CARDIOVERSION;  Surgeon: Shuck Ezra GORMAN, MD;  Location: [facility] ENDOSCOPY;  Service: Cardiovascular;  Laterality: N/A;   CARDIOVERSION N/A 05/14/2020   Procedure: CARDIOVERSION;  Surgeon:  Mona Vinie BROCKS, MD;  Location: [facility] ENDOSCOPY;  Service: Cardiovascular;  Laterality: N/A;   CORONARY ARTERY BYPASS GRAFT  02/16/2002   CABG X5   LAPAROSCOPIC APPENDECTOMY N/A 07/20/2016   Procedure: APPENDECTOMY LAPAROSCOPIC;  Surgeon: Donnice Lima, MD;  Location: [facility] OR;  Service: General;  Laterality: N/A;   TEE WITHOUT CARDIOVERSION N/A 06/01/2014   Procedure: TRANSESOPHAGEAL ECHOCARDIOGRAM (TEE);  Surgeon: Leim VEAR Moose, MD;  Location: [facility] ENDOSCOPY;  Service: Cardiovascular;  Laterality: N/A;   TEE WITHOUT CARDIOVERSION N/A 08/03/2015   Procedure: TRANSESOPHAGEAL ECHOCARDIOGRAM (TEE);  Surgeon: Leim VEAR Moose, MD;  Location: [facility] ENDOSCOPY;  Service: Cardiovascular;  Laterality: N/A;     A IV Location/Drains/Wounds Patient Lines/Drains/Airways Status     Active Line/Drains/Airways     Name Placement date Placement time Site Days   Peripheral IV 08/15/23 20 G Right Antecubital 08/15/23  2356  Antecubital  1            Intake/Output Last 24 hours  Intake/Output Summary (Last 24 hours) at 08/16/2023 1339 Last data filed at 08/16/2023 1201 Gross per 24 hour  Intake 1031.35 ml  Output --  Net 1031.35 ml    Labs/Imaging Results for orders placed or performed during the hospital encounter of 08/15/23 (from the past 48 hours)  Urinalysis, Routine w reflex microscopic -Urine, Clean Catch     Status: Abnormal   Collection Time: 08/15/23  7:39 PM  Result Value Ref Range   Color, Urine YELLOW YELLOW   APPearance HAZY (A) CLEAR   Specific Gravity, Urine 1.018 1.005 - 1.030   pH 5.0 5.0 - 8.0   Glucose, UA NEGATIVE NEGATIVE mg/dL   Hgb urine dipstick NEGATIVE NEGATIVE   Bilirubin Urine NEGATIVE NEGATIVE   Ketones, ur NEGATIVE NEGATIVE mg/dL   Protein, ur NEGATIVE NEGATIVE mg/dL   Nitrite NEGATIVE NEGATIVE   Leukocytes,Ua NEGATIVE NEGATIVE    Comment: Performed at [facility], [address], [place], [place] [address]  Lipase, blood     Status: None   Collection  Time: 08/15/23  7:53 PM  Result Value Ref Range   Lipase 27 11 - 51 U/L    Comment: Performed at [facility], [address] [place], [place] [address]  Comprehensive metabolic panel     Status: Abnormal   Collection Time: 08/15/23  7:53 PM  Result Value Ref Range   Sodium 134 (L) 135 - 145 mmol/L   Potassium 4.2 3.5 - 5.1 mmol/L   Chloride 107 98 - 111 mmol/L   CO2 14 (L) 22 - 32 mmol/L   Glucose, Bld 164 (H) 70 - 99 mg/dL    Comment: Glucose reference range applies only to samples taken after fasting for at least 8 hours.   BUN 112 (H) 8 - 23 mg/dL   Creatinine, Ser 7.35 (H) 0.61 - 1.24 mg/dL   Calcium  8.5 (L) 8.9 - 10.3 mg/dL   Total Protein 6.7 6.5 - 8.1 g/dL   Albumin 3.9 3.5 - 5.0 g/dL   AST 23 15 - 41 U/L   ALT 38 0 -  44 U/L   Alkaline Phosphatase 63 38 - 126 U/L   Total Bilirubin 1.2 0.0 - 1.2 mg/dL   GFR, Estimated 24 (L) >60 mL/min    Comment: (NOTE) Calculated using the CKD-EPI Creatinine Equation (2021)    Anion gap 13 5 - 15    Comment: Performed at [facility], [address] [place], [place] [address]  CBC     Status: Abnormal   Collection Time: 08/15/23  7:53 PM  Result Value Ref Range   WBC 14.8 (H) 4.0 - 10.5 K/uL   RBC 5.51 4.22 - 5.81 MIL/uL   Hemoglobin 16.5 13.0 - 17.0 g/dL   HCT 49.9 60.9 - 47.9 %   MCV 90.7 80.0 - 100.0 fL   MCH 29.9 26.0 - 34.0 pg   MCHC 33.0 30.0 - 36.0 g/dL   RDW 85.3 88.4 - 84.4 %   Platelets 289 150 - 400 K/uL   nRBC 0.0 0.0 - 0.2 %    Comment: Performed at [facility], [address] [place], [place] [address]  Troponin I (High Sensitivity)     Status: None   Collection Time: 08/15/23  7:53 PM  Result Value Ref Range   Troponin I (High Sensitivity) 14 <18 ng/L    Comment: (NOTE) Elevated high sensitivity troponin I (hsTnI) values and significant  changes across serial measurements may suggest ACS but many other  chronic and acute conditions are known to elevate hsTnI results.  Refer to the Links  section for chest pain algorithms and additional  guidance. Performed at [facility], [address], [place], [place] [address]   Troponin I (High Sensitivity)     Status: None   Collection Time: 08/15/23 10:09 PM  Result Value Ref Range   Troponin I (High Sensitivity) 14 <18 ng/L    Comment: (NOTE) Elevated high sensitivity troponin I (hsTnI) values and significant  changes across serial measurements may suggest ACS but many other  chronic and acute conditions are known to elevate hsTnI results.  Refer to the Links section for chest pain algorithms and additional  guidance. Performed at [facility], [address] [place], [place] [address]   Resp panel by RT-PCR (RSV, Flu A&B, Covid) Anterior Nasal Swab     Status: None   Collection Time: 08/15/23 10:34 PM   Specimen: Anterior Nasal Swab  Result Value Ref Range   SARS Coronavirus 2 by RT PCR NEGATIVE NEGATIVE   Influenza A by PCR NEGATIVE NEGATIVE   Influenza B by PCR NEGATIVE NEGATIVE    Comment: (NOTE) The Xpert Xpress SARS-CoV-2/FLU/RSV plus assay is intended as an aid in the diagnosis of influenza from Nasopharyngeal swab specimens and should not be used as a sole basis for treatment. Nasal washings and aspirates are unacceptable for Xpert Xpress SARS-CoV-2/FLU/RSV testing.  Fact Sheet for Patients: bloggercourse.com  Fact Sheet for Healthcare Providers: seriousbroker.it  This test is not yet approved or cleared by the [place]  FDA and has been authorized for detection and/or diagnosis of SARS-CoV-2 by FDA under an Emergency Use Authorization (EUA). This EUA will remain in effect (meaning this test can be used) for the duration of the COVID-19 declaration under Section 564(b)(1) of the Act, 21 U.S.C. section 360bbb-3(b)(1), unless the authorization is terminated or revoked.     Resp Syncytial Virus by PCR NEGATIVE NEGATIVE    Comment:  (NOTE) Fact Sheet for Patients: bloggercourse.com  Fact Sheet for Healthcare Providers: seriousbroker.it  This  test is not yet approved or cleared by the [place]  FDA and has been authorized for detection and/or diagnosis of SARS-CoV-2 by FDA under an Emergency Use Authorization (EUA). This EUA will remain in effect (meaning this test can be used) for the duration of the COVID-19 declaration under Section 564(b)(1) of the Act, 21 U.S.C. section 360bbb-3(b)(1), unless the authorization is terminated or revoked.  Performed at [facility], [address] [place], [place] [address]   CBC     Status: Abnormal   Collection Time: 08/16/23  4:36 AM  Result Value Ref Range   WBC 15.2 (H) 4.0 - 10.5 K/uL   RBC 5.31 4.22 - 5.81 MIL/uL   Hemoglobin 15.9 13.0 - 17.0 g/dL   HCT 52.4 60.9 - 47.9 %   MCV 89.5 80.0 - 100.0 fL   MCH 29.9 26.0 - 34.0 pg   MCHC 33.5 30.0 - 36.0 g/dL   RDW 85.2 88.4 - 84.4 %   Platelets 225 150 - 400 K/uL   nRBC 0.0 0.0 - 0.2 %    Comment: Performed at [facility], [address], [place], [place] [address]  Basic metabolic panel     Status: Abnormal   Collection Time: 08/16/23  4:36 AM  Result Value Ref Range   Sodium 136 135 - 145 mmol/L   Potassium 3.8 3.5 - 5.1 mmol/L   Chloride 113 (H) 98 - 111 mmol/L   CO2 15 (L) 22 - 32 mmol/L   Glucose, Bld 143 (H) 70 - 99 mg/dL    Comment: Glucose reference range applies only to samples taken after fasting for at least 8 hours.   BUN 110 (H) 8 - 23 mg/dL   Creatinine, Ser 7.67 (H) 0.61 - 1.24 mg/dL   Calcium  8.0 (L) 8.9 - 10.3 mg/dL   GFR, Estimated 28 (L) >60 mL/min    Comment: (NOTE) Calculated using the CKD-EPI Creatinine Equation (2021)    Anion gap 8 5 - 15    Comment: Performed at [facility] Lab, [address] [place], [place] [address]   CT ABDOMEN PELVIS WO CONTRAST Result Date: 08/15/2023 CLINICAL DATA:  Abdominal pain, acute,  nonlocalized Pt states he has felt off the past week, some abdominal pain EXAM: CT ABDOMEN AND PELVIS WITHOUT CONTRAST TECHNIQUE: Multidetector CT imaging of the abdomen and pelvis was performed following the standard protocol without IV contrast. RADIATION DOSE REDUCTION: This exam was performed according to the departmental dose-optimization program which includes automated exposure control, adjustment of the mA and/or kV according to patient size and/or use of iterative reconstruction technique. COMPARISON:  None Available. FINDINGS: Lower chest: Coronary artery calcification.  No acute abnormality. Hepatobiliary: Chronic vague hypodensity of the right hepatic lobe likely benign in etiology (3:18). No gallstones, gallbladder wall thickening, or pericholecystic fluid. No biliary dilatation. Pancreas: Diffusely atrophic. No focal lesion. Otherwise normal pancreatic contour. No surrounding inflammatory changes. No main pancreatic ductal dilatation. Spleen: Normal in size without focal abnormality. Adrenals/Urinary Tract: No adrenal nodule bilaterally. Slight hyperplasia of the adrenal glands, left greater than right. No nephrolithiasis and no hydronephrosis. Fluid density lesion likely likely represent simple renal cysts. Simple renal cysts, in the absence of clinically indicated signs/symptoms, require no independent follow-up. No ureterolithiasis or hydroureter. No nephroureterolithiasis. Calcifications associated with the kidneys are likely vascular. The urinary bladder is unremarkable. Stomach/Bowel: Stomach is within normal limits. No evidence of bowel wall thickening or dilatation. Colonic diverticulosis. Status post appendectomy. Vascular/Lymphatic: Stable in size  infrarenal abdominal aorta caliber measuring up to 3 cm. Severe atherosclerotic plaque of the aorta and its branches. No abdominal, pelvic, or inguinal lymphadenopathy. Reproductive: Prostatomegaly. Other: No intraperitoneal free fluid. No  intraperitoneal free gas. No organized fluid collection. Musculoskeletal: No abdominal wall hernia or abnormality. No suspicious lytic or blastic osseous lesions. No acute displaced fracture. L3-L4 intervertebral disc space vacuum phenomenon. Bilateral L5 pars interarticularis defects with grade 1 anterolisthesis of L5 on S1. IMPRESSION: 1. Colonic diverticulosis with no acute diverticulitis. 2. Prostatomegaly. 3. Stable in size aneurysmal 3 cm infrarenal abdominal aorta. Recommend follow-up ultrasound every 3 years. (Ref.: J Vasc Surg. 2018; 67:2-77 and J Am Coll Radiol 2013;10(10):789-794.) 4. Aortic Atherosclerosis (ICD10-I70.0)-severe. 5. Nonspecific hyperplasia of the adrenal glands. Electronically Signed   By: Morgane  Naveau M.D.   On: 08/15/2023 23:36    Pending Labs Unresulted Labs (From admission, onward)     Start     Ordered   08/17/23 0500  CBC  Tomorrow morning,   R        08/16/23 1125   08/17/23 0500  Basic metabolic panel  Tomorrow morning,   R        08/16/23 1125   08/16/23 1600  Basic metabolic panel  ONCE - STAT,   STAT        08/16/23 1125            Vitals/Pain Today's Vitals   08/16/23 0500 08/16/23 0810 08/16/23 1152 08/16/23 1157  BP: 114/76 117/74  124/82  Pulse: (!) 51 (!) 58  65  Resp: 19 (!) 23    Temp: 97.7 F (36.5 C) 98 F (36.7 C) 97.6 F (36.4 C)   TempSrc:  Oral Oral   SpO2: 100% 98%    Weight:      Height:      PainSc:        Isolation Precautions No active isolations  Medications Medications  aspirin  EC tablet 81 mg (81 mg Oral Given 08/16/23 0914)  rosuvastatin  (CRESTOR ) tablet 40 mg (40 mg Oral Given 08/16/23 0914)  apixaban  (ELIQUIS ) tablet 5 mg (5 mg Oral Given 08/16/23 0914)  lactated ringers  infusion ( Intravenous New Bag/Given 08/16/23 1200)  metoprolol  succinate (TOPROL -XL) 24 hr tablet 12.5 mg (12.5 mg Oral Given 08/16/23 1157)  sodium chloride  0.9 % bolus 500 mL (0 mLs Intravenous Stopped 08/16/23 0204)  sodium chloride  0.9 %  bolus 500 mL (0 mLs Intravenous Stopped 08/16/23 0314)    Mobility walks with person assist     Focused Assessments Renal Assessment Handoff:  AKI   R Recommendations: See Admitting Provider Note  Report given to:   Additional Notes:

## 2023-08-17 DIAGNOSIS — E872 Acidosis, unspecified: Secondary | ICD-10-CM | POA: Diagnosis not present

## 2023-08-17 DIAGNOSIS — N179 Acute kidney failure, unspecified: Secondary | ICD-10-CM | POA: Diagnosis not present

## 2023-08-17 LAB — BASIC METABOLIC PANEL
Anion gap: 11 (ref 5–15)
BUN: 68 mg/dL — ABNORMAL HIGH (ref 8–23)
CO2: 16 mmol/L — ABNORMAL LOW (ref 22–32)
Calcium: 8.3 mg/dL — ABNORMAL LOW (ref 8.9–10.3)
Chloride: 109 mmol/L (ref 98–111)
Creatinine, Ser: 1.73 mg/dL — ABNORMAL HIGH (ref 0.61–1.24)
GFR, Estimated: 40 mL/min — ABNORMAL LOW (ref >60)
Glucose, Bld: 137 mg/dL — ABNORMAL HIGH (ref 70–99)
Potassium: 3.5 mmol/L (ref 3.5–5.1)
Sodium: 136 mmol/L (ref 135–145)

## 2023-08-17 LAB — CBC
HCT: 45.5 % (ref 39.0–52.0)
Hemoglobin: 15.6 g/dL (ref 13.0–17.0)
MCH: 29.8 pg (ref 26.0–34.0)
MCHC: 34.3 g/dL (ref 30.0–36.0)
MCV: 87 fL (ref 80.0–100.0)
Platelets: 234 10*3/uL (ref 150–400)
RBC: 5.23 MIL/uL (ref 4.22–5.81)
RDW: 14.6 % (ref 11.5–15.5)
WBC: 18.3 10*3/uL — ABNORMAL HIGH (ref 4.0–10.5)
nRBC: 0 % (ref 0.0–0.2)

## 2023-08-17 MED ORDER — PANTOPRAZOLE SODIUM 40 MG PO TBEC
40.0000 mg | DELAYED_RELEASE_TABLET | Freq: Every day | ORAL | Status: DC
  Administered 2023-08-17 – 2023-08-18 (×2): 40 mg via ORAL
  Filled 2023-08-17 (×2): qty 1

## 2023-08-17 MED ORDER — SODIUM BICARBONATE 8.4 % IV SOLN
INTRAVENOUS | Status: AC
  Filled 2023-08-17: qty 1000
  Filled 2023-08-17: qty 150

## 2023-08-17 MED ORDER — ALUM & MAG HYDROXIDE-SIMETH 200-200-20 MG/5ML PO SUSP
30.0000 mL | Freq: Four times a day (QID) | ORAL | Status: DC | PRN
  Administered 2023-08-17: 30 mL via ORAL
  Filled 2023-08-17: qty 30

## 2023-08-17 NOTE — Progress Notes (Signed)
 PROGRESS NOTE   Stanley Wells  FMW:[id number]    DOB: February 28, 1946    DOA: 08/15/2023  PCP: [facility]   I have briefly reviewed patients previous medical records in [facility].  Chief Complaint  Patient presents with   not feeling well    Brief Hospital Course:  78 year old married male, lives alone, independent, medical history significant for CAD s/p CABG, ischemic cardiomyopathy, chronic systolic CHF, severe functional regurgitation persistent atrial fibrillation on Eliquis , CVA, presented to ED on 9/12 with complaints of intractable nausea, dry heaves, poor oral intake, self-limited diarrhea/pain generalized weakness.  Admitted for suspected acute gastroenteritis versus food poisoning, associated dehydration, acute kidney injury and generalized weakness.  AKI improving.  Still with significant NAGMA.  Changed IV fluids to bicarbonate drip.   Assessment & Plan:  Principal Problem:   AKI (acute kidney injury) (HCC) Active Problems:   Hypotension   Persistent atrial fibrillation (HCC)   Chronic systolic CHF (congestive heart failure) (HCC)   Metabolic acidosis   Infrarenal abdominal aortic aneurysm (AAA) without rupture (HCC)   History of CVA (cerebrovascular accident)   Gastroenteritis   Acute gastroenteritis   Acute gastroenteritis versus food poisoning CT abdomen without acute findings. Resolved.  Dehydration/hypotension Secondary to GI losses and poor oral intake.  Hypotension resolved. Resolved.  Acute kidney injury Creatinine 1.17 on 04/28/2023.  Presented with creatinine 2.64. Secondary to dehydration/may have had an element of mild ATN/had soft blood pressures on arrival and Entresto  Avoid nephrotoxic medications and hypotension Creatinine continues to gradually improve, down to 1.73.  Still with substantial NAGMA, bicarbonate 16.  Changed to IV bicarbonate drip. Follow BMP in AM.  Non-anion gap metabolic acidosis Serum bicarbonate 14 >  > 19 > 16 Secondary to GI losses. Changed IVF to bicarbonate drip.  Follow BMP in AM.  History of CVA Continue aspirin   Incidental infrarenal AAA without rupture Incidental finding on CT A/P As per recommendations, ultrasound surveillance every 3 years  Chronic systolic CHF History send TTE on 05/2023 showed LVEF of 50-55%, trivial EF seems to have normalized. Not on diuretics PTA.  Hold Entresto  and Imdur .  Persistent atrial fibrillation Currently in sinus rhythm on telemetry.  Continue Eliquis .  Beta-blockers were held, resume low-dose Toprol -XL, gradually titrate back to home dose at discharge.  Leukocytosis Suspect reactive despite bump from 15-18.  Follow CBCs.  Body mass index is 28.72 kg/m.   DVT prophylaxis:   Apixaban    Code Status: Limited: Do not attempt resuscitation (DNR) -DNR-LIMITED -Do Not Intubate/DNI :  Family Communication: Son at bedside Disposition:  Bicarbonate drip for NAGMA and AKI.  Hopeful DC in next 1 to 2 days.     Consultants:     Procedures:     Antimicrobials:      Subjective:  No further nausea, vomiting, abdominal pain or diarrhea.  Had a normal BM today.  Tolerating regular consistency diet.  Objective:   Vitals:   08/16/23 2006 08/17/23 0551 08/17/23 0813 08/17/23 0816  BP: 125/77 111/79 113/66 113/66  Pulse: 77 94 92 92  Resp: 18 18 18    Temp: 98.3 F (36.8 C) 98.3 F (36.8 C) 98.2 F (36.8 C)   TempSrc:   Oral   SpO2: 99% 99% 98%   Weight:      Height:        General exam: Elderly male, moderately built and nourished lying comfortably propped up in bed without distress.  Oral mucosa moist Respiratory system: Clear to auscultation.  No increased work of breathing Cardiovascular system: S1 & S2 heard, RRR. No JVD, murmurs, rubs, gallops or clicks. No pedal edema.  Off telemetry Gastrointestinal system: Abdomen is nondistended, soft and nontender. No organomegaly or masses felt. Normal bowel sounds heard.  Stable and  unchanged. Central nervous system: Alert and oriented. No focal neurological deficits. Extremities: Symmetric 5 x 5 power. Skin: No rashes, lesions or ulcers Psychiatry: Judgement and insight appear normal. Mood & affect appropriate.     Data Reviewed:   I have personally reviewed following labs and imaging studies   CBC: Recent Labs  Lab 08/15/23 1953 08/16/23 0436 08/17/23 0329  WBC 14.8* 15.2* 18.3*  HGB 16.5 15.9 15.6  HCT 50.0 47.5 45.5  MCV 90.7 89.5 87.0  PLT 289 225 234    Basic Metabolic Panel: Recent Labs  Lab 08/15/23 1953 08/16/23 0436 08/16/23 1702 08/17/23 0329  NA 134* 136 135 136  K 4.2 3.8 4.9 3.5  CL 107 113* 109 109  CO2 14* 15* 19* 16*  GLUCOSE 164* 143* 105* 137*  BUN 112* 110* 95* 68*  CREATININE 2.64* 2.32* 1.99* 1.73*  CALCIUM  8.5* 8.0* 8.5* 8.3*    Liver Function Tests: Recent Labs  Lab 08/15/23 1953  AST 23  ALT 38  ALKPHOS 63  BILITOT 1.2  PROT 6.7  ALBUMIN 3.9    CBG: No results for input(s): GLUCAP in the last 168 hours.  Microbiology Studies:   Recent Results (from the past 240 hours)  Resp panel by RT-PCR (RSV, Flu A&B, Covid) Anterior Nasal Swab     Status: None   Collection Time: 08/15/23 10:34 PM   Specimen: Anterior Nasal Swab  Result Value Ref Range Status   SARS Coronavirus 2 by RT PCR NEGATIVE NEGATIVE Final   Influenza A by PCR NEGATIVE NEGATIVE Final   Influenza B by PCR NEGATIVE NEGATIVE Final    Comment: (NOTE) The Xpert Xpress SARS-CoV-2/FLU/RSV plus assay is intended as an aid in the diagnosis of influenza from Nasopharyngeal swab specimens and should not be used as a sole basis for treatment. Nasal washings and aspirates are unacceptable for Xpert Xpress SARS-CoV-2/FLU/RSV testing.  Fact Sheet for Patients: bloggercourse.com  Fact Sheet for Healthcare Providers: seriousbroker.it  This test is not yet approved or cleared by the [place]   FDA and has been authorized for detection and/or diagnosis of SARS-CoV-2 by FDA under an Emergency Use Authorization (EUA). This EUA will remain in effect (meaning this test can be used) for the duration of the COVID-19 declaration under Section 564(b)(1) of the Act, 21 U.S.C. section 360bbb-3(b)(1), unless the authorization is terminated or revoked.     Resp Syncytial Virus by PCR NEGATIVE NEGATIVE Final    Comment: (NOTE) Fact Sheet for Patients: bloggercourse.com  Fact Sheet for Healthcare Providers: seriousbroker.it  This test is not yet approved or cleared by the [place]  FDA and has been authorized for detection and/or diagnosis of SARS-CoV-2 by FDA under an Emergency Use Authorization (EUA). This EUA will remain in effect (meaning this test can be used) for the duration of the COVID-19 declaration under Section 564(b)(1) of the Act, 21 U.S.C. section 360bbb-3(b)(1), unless the authorization is terminated or revoked.  Performed at [facility], [address], [place], [place] [address]     Radiology Studies:  CT ABDOMEN PELVIS WO CONTRAST Result Date: 08/15/2023 CLINICAL DATA:  Abdominal pain, acute, nonlocalized Pt states he has felt off the past week, some abdominal pain EXAM: CT ABDOMEN AND  PELVIS WITHOUT CONTRAST TECHNIQUE: Multidetector CT imaging of the abdomen and pelvis was performed following the standard protocol without IV contrast. RADIATION DOSE REDUCTION: This exam was performed according to the departmental dose-optimization program which includes automated exposure control, adjustment of the mA and/or kV according to patient size and/or use of iterative reconstruction technique. COMPARISON:  None Available. FINDINGS: Lower chest: Coronary artery calcification.  No acute abnormality. Hepatobiliary: Chronic vague hypodensity of the right hepatic lobe likely benign in etiology (3:18). No gallstones,  gallbladder wall thickening, or pericholecystic fluid. No biliary dilatation. Pancreas: Diffusely atrophic. No focal lesion. Otherwise normal pancreatic contour. No surrounding inflammatory changes. No main pancreatic ductal dilatation. Spleen: Normal in size without focal abnormality. Adrenals/Urinary Tract: No adrenal nodule bilaterally. Slight hyperplasia of the adrenal glands, left greater than right. No nephrolithiasis and no hydronephrosis. Fluid density lesion likely likely represent simple renal cysts. Simple renal cysts, in the absence of clinically indicated signs/symptoms, require no independent follow-up. No ureterolithiasis or hydroureter. No nephroureterolithiasis. Calcifications associated with the kidneys are likely vascular. The urinary bladder is unremarkable. Stomach/Bowel: Stomach is within normal limits. No evidence of bowel wall thickening or dilatation. Colonic diverticulosis. Status post appendectomy. Vascular/Lymphatic: Stable in size infrarenal abdominal aorta caliber measuring up to 3 cm. Severe atherosclerotic plaque of the aorta and its branches. No abdominal, pelvic, or inguinal lymphadenopathy. Reproductive: Prostatomegaly. Other: No intraperitoneal free fluid. No intraperitoneal free gas. No organized fluid collection. Musculoskeletal: No abdominal wall hernia or abnormality. No suspicious lytic or blastic osseous lesions. No acute displaced fracture. L3-L4 intervertebral disc space vacuum phenomenon. Bilateral L5 pars interarticularis defects with grade 1 anterolisthesis of L5 on S1. IMPRESSION: 1. Colonic diverticulosis with no acute diverticulitis. 2. Prostatomegaly. 3. Stable in size aneurysmal 3 cm infrarenal abdominal aorta. Recommend follow-up ultrasound every 3 years. (Ref.: J Vasc Surg. 2018; 67:2-77 and J Am Coll Radiol 2013;10(10):789-794.) 4. Aortic Atherosclerosis (ICD10-I70.0)-severe. 5. Nonspecific hyperplasia of the adrenal glands. Electronically Signed   By: Morgane   Naveau M.D.   On: 08/15/2023 23:36    Scheduled Meds:    apixaban   5 mg Oral BID   aspirin  EC  81 mg Oral Daily   metoprolol  succinate  12.5 mg Oral BID   rosuvastatin   40 mg Oral Daily    Continuous Infusions:    sodium bicarbonate  150 mEq in dextrose  5 % 1,150 mL infusion 75 mL/hr at 08/17/23 9287     LOS: 1 day     Trenda Mar, MD,  FACP, [facility], [facility], [facility]   [facility] [place]      To contact the attending provider between 7A-7P or the covering provider during after hours 7P-7A, please log into the web site [email or web address] and access using universal Anna password for that web site. If you do not have the password, please call the hospital operator.  08/17/2023, 9:24 AM

## 2023-08-17 NOTE — Plan of Care (Signed)

## 2023-08-17 NOTE — TOC CM/SW Note (Signed)
 Transition of Care [facility]) - Inpatient Brief Assessment   Patient Details  Name: Stanley [name]: [id number] Date of Birth: [birth year]  Transition of Care [facility]) CM/SW Contact:    Tom-Johnson, Harvest Muskrat, RN Phone Number: 08/17/2023, 4:07 PM   Clinical Narrative:   Patient presented to th ED with persistent Diarrhea and Generalized Weakness. Admitted with AKI. On Eliquis  for A-fib, has hx of CVA, CAD s/p CABG, Ischemic Cardiomyopathy/chronic Systolic CHF, severe Mitral Regurgitation.   From home with wife, has seven children. Retired, independent with care and drive self prior to admit. Does not have DME's at home.  PCP is [place], Dene [name], DO and uses [facility] in [place].   No TOC needs or recommendations noted at this time.  Patient not Medically ready for discharge.  CM will continue to follow as patient progresses with care towards discharge.        Transition of Care Asessment: Insurance and Status: Insurance coverage has been reviewed Patient has primary care physician: Yes Home environment has been reviewed: Yes Prior level of function:: Independent Prior/Current Home Services: No current home services Social Drivers of Health Review: SDOH reviewed no interventions necessary Readmission risk has been reviewed: Yes Transition of care needs: no transition of care needs at this time

## 2023-08-18 ENCOUNTER — Other Ambulatory Visit (HOSPITAL_COMMUNITY): Payer: Self-pay

## 2023-08-18 DIAGNOSIS — K529 Noninfective gastroenteritis and colitis, unspecified: Secondary | ICD-10-CM | POA: Diagnosis not present

## 2023-08-18 DIAGNOSIS — E872 Acidosis, unspecified: Secondary | ICD-10-CM | POA: Diagnosis not present

## 2023-08-18 DIAGNOSIS — N179 Acute kidney failure, unspecified: Secondary | ICD-10-CM | POA: Diagnosis not present

## 2023-08-18 LAB — CBC
HCT: 40.2 % (ref 39.0–52.0)
Hemoglobin: 13.7 g/dL (ref 13.0–17.0)
MCH: 29.9 pg (ref 26.0–34.0)
MCHC: 34.1 g/dL (ref 30.0–36.0)
MCV: 87.8 fL (ref 80.0–100.0)
Platelets: 192 10*3/uL (ref 150–400)
RBC: 4.58 MIL/uL (ref 4.22–5.81)
RDW: 14.6 % (ref 11.5–15.5)
WBC: 13.1 10*3/uL — ABNORMAL HIGH (ref 4.0–10.5)
nRBC: 0 % (ref 0.0–0.2)

## 2023-08-18 LAB — BASIC METABOLIC PANEL
Anion gap: 5 (ref 5–15)
BUN: 39 mg/dL — ABNORMAL HIGH (ref 8–23)
CO2: 28 mmol/L (ref 22–32)
Calcium: 8.1 mg/dL — ABNORMAL LOW (ref 8.9–10.3)
Chloride: 104 mmol/L (ref 98–111)
Creatinine, Ser: 1.59 mg/dL — ABNORMAL HIGH (ref 0.61–1.24)
GFR, Estimated: 44 mL/min — ABNORMAL LOW (ref >60)
Glucose, Bld: 132 mg/dL — ABNORMAL HIGH (ref 70–99)
Potassium: 3.6 mmol/L (ref 3.5–5.1)
Sodium: 137 mmol/L (ref 135–145)

## 2023-08-18 MED ORDER — PANTOPRAZOLE SODIUM 40 MG PO TBEC
40.0000 mg | DELAYED_RELEASE_TABLET | Freq: Every day | ORAL | 0 refills | Status: DC
  Filled 2023-08-18: qty 30, 30d supply, fill #0

## 2023-08-18 MED ORDER — METOPROLOL SUCCINATE ER 25 MG PO TB24
12.5000 mg | ORAL_TABLET | Freq: Two times a day (BID) | ORAL | 0 refills | Status: DC
  Filled 2023-08-18: qty 30, 30d supply, fill #0

## 2023-08-18 NOTE — Discharge Instructions (Signed)

## 2023-08-18 NOTE — Discharge Summary (Signed)
 Physician Discharge Summary  Stanley Wells FMW:[id number] DOB: 1946-06-06  PCP: Stanley Wells  Admitted from: Home Discharged to: Home  Admit date: 08/15/2023 Discharge date: 08/18/2023  Recommendations for Outpatient Follow-up:    Follow-up Information     Stanley Wells. Schedule an appointment as soon as possible for a visit.   Specialty: Family Medicine Why: To be seen in 5 to 7 days with repeat labs (CBC & BMP). Contact information: [address] [place] [address] [phone]                  Home Health: None    Equipment/Devices: None    Discharge Condition: Improved and stable.   Code Status: Limited: Wells not attempt resuscitation (DNR) -DNR-LIMITED -Wells Not Intubate/DNI  Diet recommendation:  Discharge Diet Orders (From admission, onward)     Start     Ordered   08/18/23 0000  Diet - low sodium heart healthy        08/18/23 0915             Discharge Diagnoses:  Principal Problem:   AKI (acute kidney injury) (HCC) Active Problems:   Hypotension   Persistent atrial fibrillation (HCC)   Chronic systolic CHF (congestive heart failure) (HCC)   Metabolic acidosis   Infrarenal abdominal aortic aneurysm (AAA) without rupture (HCC)   History of CVA (cerebrovascular accident)   Gastroenteritis   Acute gastroenteritis   Brief Hospital Course:  78 year old married male, lives alone, independent, medical history significant for CAD s/p CABG, ischemic cardiomyopathy, chronic systolic CHF, severe functional regurgitation persistent atrial fibrillation on Eliquis , CVA, presented to ED on 9/12 with complaints of intractable nausea, dry heaves, poor oral intake, self-limited diarrhea/pain generalized weakness.  Admitted for suspected acute gastroenteritis versus food poisoning, associated dehydration, acute kidney injury and generalized weakness.  AKI improving.  Still had significant NAGMA.  Changed IV fluids to bicarbonate drip.  NAGMA  has resolved.  Creatinine continues to improve.  Stable for DC with close outpatient follow-up.     Assessment & Plan:    Acute gastroenteritis versus food poisoning CT abdomen without acute findings. Resolved almost since day of admission.   Dehydration/hypotension Secondary to GI losses and poor oral intake.  Hypotension resolved. Resolved.   Acute kidney injury Creatinine 1.17 on 04/28/2023.  Presented with creatinine 2.64. Secondary to dehydration/may have had an element of mild ATN/had soft blood pressures on arrival and Entresto  Avoid nephrotoxic medications and hypotension Creatinine continues to gradually improve, down to 1.59.  Had significant NAGMA yesterday, treated with bicarbonate drip and that has resolved. There was no hydronephrosis or hydroureter by CT abdomen on admission Expect AKI to continue to improve to prior baseline.  Will hold Entresto  and Imdur  and continue reduced dose of Toprol -XL compared to prior home dose, to avoid hypotension and worsening of AKI. Discussed extensively with patient to avoid NSAIDs, drink PO fluids ad lib. Outpatient follow-up in the next 5 to 7 days with repeat BMP and if AKI resolves and blood pressures improving or start to rise then can consider resuming Entresto  and Imdur .   Non-anion gap metabolic acidosis Serum bicarbonate 14 > > 19 > 16 >28 Secondary to GI losses. Changed IVF to bicarbonate drip.SABRA  Resolved.   History of CVA Continue aspirin .  Of note patient is also on Eliquis  which would obviously increase the bleeding risk.  Defer to outpatient providers to consider if aspirin  can be discontinued.   Incidental infrarenal AAA without  rupture Incidental finding on CT A/P As per recommendations, ultrasound surveillance every 3 years.  Outpatient follow-up with PCP.  May consider outpatient vascular surgery consultation.   Chronic systolic CHF with normalized EF Most recent TTE on 05/2023 showed LVEF of 50-55%, trivial EF  seems to have normalized. Not on diuretics PTA.  Hold Entresto  and Imdur  as discussed above due to soft blood pressures.   Persistent atrial fibrillation Sinus rhythm clinically.  Continue Eliquis .  Beta-blockers were held, resumed low-dose Toprol -XL, gradually titrate back to home dose during out patient follow-up   Leukocytosis Suspect reactive despite bump from 15-18.  WBC has improved to 13.1.   Body mass index is 28.72 kg/m./Overweight        Consultants:    None   Procedures:    None   Discharge Instructions  Discharge Instructions     (HEART FAILURE PATIENTS) Call MD:  Anytime you have any of the following symptoms: 1) 3 pound weight gain in 24 hours or 5 pounds in 1 week 2) shortness of breath, with or without a dry hacking cough 3) swelling in the hands, feet or stomach 4) if you have to sleep on extra pillows at night in order to breathe.   Complete by: As directed    Call MD for:  difficulty breathing, headache or visual disturbances   Complete by: As directed    Call MD for:  extreme fatigue   Complete by: As directed    Call MD for:  persistant dizziness or light-headedness   Complete by: As directed    Call MD for:  persistant nausea and vomiting   Complete by: As directed    Call MD for:  severe uncontrolled pain   Complete by: As directed    Call MD for:  temperature >100.4   Complete by: As directed    Diet - low sodium heart healthy   Complete by: As directed    Discharge instructions   Complete by: As directed    1) Due to your acute kidney injury in the hospital, you are advised to avoid NSAIDs i.e. medication such as ibuprofen, Motrin, Naprosyn, Aleve, Goody's powder, BC powder etc. 2) some of your blood pressure and heart medications are on hold due to soft blood pressures and recent acute kidney injury, you must follow-up with your family physician with repeat labs to consider resuming these.   Increase activity slowly   Complete by: As directed          Medication List     STOP taking these medications    Entresto  97-103 MG Generic drug: sacubitril -valsartan    isosorbide  mononitrate 30 MG 24 hr tablet Commonly known as: IMDUR        TAKE these medications    aspirin  EC 81 MG tablet Take 1 tablet (81 mg total) by mouth daily. Swallow whole.   Eliquis  5 MG Tabs tablet Generic drug: apixaban  Take 1 tablet (5 mg total) by mouth 2 (two) times daily.   metoprolol  succinate 25 MG 24 hr tablet Commonly known as: TOPROL -XL Take 0.5 tablets (12.5 mg total) by mouth 2 (two) times daily. Take with or immediately following a meal. What changed:  medication strength how much to take   pantoprazole  40 MG tablet Commonly known as: PROTONIX  Take 1 tablet (40 mg total) by mouth daily. Start taking on: August 19, 2023   rosuvastatin  40 MG tablet Commonly known as: CRESTOR  Take 1 tablet (40 mg total) by mouth daily.  No Known Allergies    Procedures/Studies: CT ABDOMEN PELVIS WO CONTRAST Result Date: 08/15/2023 CLINICAL DATA:  Abdominal pain, acute, nonlocalized Pt states he has felt off the past week, some abdominal pain EXAM: CT ABDOMEN AND PELVIS WITHOUT CONTRAST TECHNIQUE: Multidetector CT imaging of the abdomen and pelvis was performed following the standard protocol without IV contrast. RADIATION DOSE REDUCTION: This exam was performed according to the departmental dose-optimization program which includes automated exposure control, adjustment of the mA and/or kV according to patient size and/or use of iterative reconstruction technique. COMPARISON:  None Available. FINDINGS: Lower chest: Coronary artery calcification.  No acute abnormality. Hepatobiliary: Chronic vague hypodensity of the right hepatic lobe likely benign in etiology (3:18). No gallstones, gallbladder wall thickening, or pericholecystic fluid. No biliary dilatation. Pancreas: Diffusely atrophic. No focal lesion. Otherwise normal pancreatic contour.  No surrounding inflammatory changes. No main pancreatic ductal dilatation. Spleen: Normal in size without focal abnormality. Adrenals/Urinary Tract: No adrenal nodule bilaterally. Slight hyperplasia of the adrenal glands, left greater than right. No nephrolithiasis and no hydronephrosis. Fluid density lesion likely likely represent simple renal cysts. Simple renal cysts, in the absence of clinically indicated signs/symptoms, require no independent follow-up. No ureterolithiasis or hydroureter. No nephroureterolithiasis. Calcifications associated with the kidneys are likely vascular. The urinary bladder is unremarkable. Stomach/Bowel: Stomach is within normal limits. No evidence of bowel wall thickening or dilatation. Colonic diverticulosis. Status post appendectomy. Vascular/Lymphatic: Stable in size infrarenal abdominal aorta caliber measuring up to 3 cm. Severe atherosclerotic plaque of the aorta and its branches. No abdominal, pelvic, or inguinal lymphadenopathy. Reproductive: Prostatomegaly. Other: No intraperitoneal free fluid. No intraperitoneal free gas. No organized fluid collection. Musculoskeletal: No abdominal wall hernia or abnormality. No suspicious lytic or blastic osseous lesions. No acute displaced fracture. L3-L4 intervertebral disc space vacuum phenomenon. Bilateral L5 pars interarticularis defects with grade 1 anterolisthesis of L5 on S1. IMPRESSION: 1. Colonic diverticulosis with no acute diverticulitis. 2. Prostatomegaly. 3. Stable in size aneurysmal 3 cm infrarenal abdominal aorta. Recommend follow-up ultrasound every 3 years. (Ref.: J Vasc Surg. 2018; 67:2-77 and J Am Coll Radiol 2013;10(10):789-794.) 4. Aortic Atherosclerosis (ICD10-I70.0)-severe. 5. Nonspecific hyperplasia of the adrenal glands. Electronically Signed   By: Morgane  Naveau M.D.   On: 08/15/2023 23:36      Subjective: Denies complaints.  Tolerating diet without nausea or vomiting almost since the day of admission.  Last  BM yesterday morning.  No more diarrhea.  No abdominal pain.  Reports that he has been ambulating steadily and normally in his room.  No chest pain, dyspnea, dizziness or lightheadedness.  Since admission somewhat mildly and intermittently confused at times.  Son reports that patient called him at around 430 this morning with some confusion.  No agitation.  Discharge Exam:  Vitals:   08/17/23 1644 08/17/23 2019 08/18/23 0406 08/18/23 0904  BP: 107/62 (!) 101/59 107/72 109/64  Pulse: 80 73 (!) 55 72  Resp: 18 18 18 18   Temp: 97.8 F (36.6 C) 98.1 F (36.7 C) 97.7 F (36.5 C) (!) 97.5 F (36.4 C)  TempSrc: Oral Oral  Oral  SpO2: 98% 97% 97% 99%  Weight:      Height:        General exam: Elderly male, moderately built and nourished lying comfortably propped up in bed without distress.  Oral mucosa moist Respiratory system: Clear to auscultation.  No increased work of breathing Cardiovascular system: S1 & S2 heard, RRR. No JVD, murmurs, rubs, gallops or clicks. No pedal edema.  Off telemetry  Gastrointestinal system: Abdomen is nondistended, soft and nontender. No organomegaly or masses felt. Normal bowel sounds heard.  Stable and unchanged. Central nervous system: Alert and oriented. No focal neurological deficits. Extremities: Symmetric 5 x 5 power. Skin: No rashes, lesions or ulcers Psychiatry: Judgement and insight appear normal. Mood & affect appropriate.     The results of significant diagnostics from this hospitalization (including imaging, microbiology, ancillary and laboratory) are listed below for reference.     Microbiology: Recent Results (from the past 240 hours)  Resp panel by RT-PCR (RSV, Flu A&B, Covid) Anterior Nasal Swab     Status: None   Collection Time: 08/15/23 10:34 PM   Specimen: Anterior Nasal Swab  Result Value Ref Range Status   SARS Coronavirus 2 by RT PCR NEGATIVE NEGATIVE Final   Influenza A by PCR NEGATIVE NEGATIVE Final   Influenza B by PCR  NEGATIVE NEGATIVE Final    Comment: (NOTE) The Xpert Xpress SARS-CoV-2/FLU/RSV plus assay is intended as an aid in the diagnosis of influenza from Nasopharyngeal swab specimens and should not be used as a sole basis for treatment. Nasal washings and aspirates are unacceptable for Xpert Xpress SARS-CoV-2/FLU/RSV testing.  Fact Sheet for Patients: bloggercourse.com  Fact Sheet for Healthcare Providers: seriousbroker.it  This test is not yet approved or cleared by the [place]  FDA and has been authorized for detection and/or diagnosis of SARS-CoV-2 by FDA under an Emergency Use Authorization (EUA). This EUA will remain in effect (meaning this test can be used) for the duration of the COVID-19 declaration under Section 564(b)(1) of the Act, 21 U.S.C. section 360bbb-3(b)(1), unless the authorization is terminated or revoked.     Resp Syncytial Virus by PCR NEGATIVE NEGATIVE Final    Comment: (NOTE) Fact Sheet for Patients: bloggercourse.com  Fact Sheet for Healthcare Providers: seriousbroker.it  This test is not yet approved or cleared by the [place]  FDA and has been authorized for detection and/or diagnosis of SARS-CoV-2 by FDA under an Emergency Use Authorization (EUA). This EUA will remain in effect (meaning this test can be used) for the duration of the COVID-19 declaration under Section 564(b)(1) of the Act, 21 U.S.C. section 360bbb-3(b)(1), unless the authorization is terminated or revoked.  Performed at [facility], [address] [place], [place] [address]      Labs: CBC: Recent Labs  Lab 08/15/23 1953 08/16/23 0436 08/17/23 0329 08/18/23 0343  WBC 14.8* 15.2* 18.3* 13.1*  HGB 16.5 15.9 15.6 13.7  HCT 50.0 47.5 45.5 40.2  MCV 90.7 89.5 87.0 87.8  PLT 289 225 234 192    Basic Metabolic Panel: Recent Labs  Lab 08/15/23 1953 08/16/23 0436  08/16/23 1702 08/17/23 0329 08/18/23 0343  NA 134* 136 135 136 137  K 4.2 3.8 4.9 3.5 3.6  CL 107 113* 109 109 104  CO2 14* 15* 19* 16* 28  GLUCOSE 164* 143* 105* 137* 132*  BUN 112* 110* 95* 68* 39*  CREATININE 2.64* 2.32* 1.99* 1.73* 1.59*  CALCIUM  8.5* 8.0* 8.5* 8.3* 8.1*    Liver Function Tests: Recent Labs  Lab 08/15/23 1953  AST 23  ALT 38  ALKPHOS 63  BILITOT 1.2  PROT 6.7  ALBUMIN 3.9     Urinalysis    Component Value Date/Time   COLORURINE YELLOW 08/15/2023 1939   APPEARANCEUR HAZY (A) 08/15/2023 1939   LABSPEC 1.018 08/15/2023 1939   PHURINE 5.0 08/15/2023 1939   GLUCOSEU NEGATIVE 08/15/2023 1939   HGBUR NEGATIVE 08/15/2023 1939  BILIRUBINUR NEGATIVE 08/15/2023 1939   KETONESUR NEGATIVE 08/15/2023 1939   PROTEINUR NEGATIVE 08/15/2023 1939   UROBILINOGEN 0.2 05/29/2014 1505   NITRITE NEGATIVE 08/15/2023 1939   LEUKOCYTESUR NEGATIVE 08/15/2023 1939    I discussed in detail with his son in detail via phone, updated care and answered all questions.  Son reports that patient has been having some memory loss and confusion over some time prior to the admission as well.  Advised him that he may have age-related cognitive impairment or early mild dementia> can be followed up OP with his PCP.  Time coordinating discharge: 35 minutes  SIGNED:  Trenda Mar, MD,  FACP, [facility], [facility], [facility]   [facility] [place]     To contact the attending provider between 7A-7P or the covering provider during after hours 7P-7A, please log into the web site [email or web address] and access using universal [place] password for that web site. If you Wells not have the password, please call the hospital operator.

## 2023-08-18 NOTE — Plan of Care (Signed)

## 2023-08-18 NOTE — TOC Transition Note (Signed)
 Transition of Care [facility] - Discharge Note   Patient Details  Name: Stanley [name]: [id number] Date of Birth: 1945/10/11  Transition of Care [facility]) CM/SW Contact:  Tom-Johnson, Harvest Muskrat, RN Phone Number: 08/18/2023, 10:15 AM   Clinical Narrative:     Patient is scheduled for discharge today.  Readmission Risk Assessment done. Outpatient f/u, hospital f/u and discharge instructions on AVS. Prescriptions sent to [facility] pharmacy and patient will receive meds prior discharge. No TOC needs or recommendations noted. Wife, [name].  No further TOC needs noted.         Final next level of care: Home/Self Care Barriers to Discharge: Barriers Resolved   Patient Goals and CMS Choice Patient states their goals for this hospitalization and ongoing recovery are:: To return home CMS Medicare.gov Compare Post Acute Care list provided to:: Patient Choice offered to / list presented to : NA      Discharge Placement                Patient to be transferred to facility by: Spouse Name of family member notified: [name] Additional resources added to the After Visit Summary for                  DME Arranged: N/A DME Agency: NA       HH Arranged: NA HH Agency: NA        Social Drivers of Health (SDOH) Interventions SDOH Screenings   Food Insecurity: No Food Insecurity (08/16/2023)  Housing: Low Risk  (08/16/2023)  Transportation Needs: No Transportation Needs (08/16/2023)  Utilities: Not At Risk (08/16/2023)  Depression (PHQ2-9): Low Risk  (04/05/2021)  Social Connections: Moderately Isolated (08/16/2023)  Tobacco Use: High Risk (08/15/2023)     Readmission Risk Interventions    08/17/2023    4:07 PM  Readmission Risk Prevention Plan  Post Dischage Appt Complete  Medication Screening Complete  Transportation Screening Complete

## 2023-08-25 ENCOUNTER — Other Ambulatory Visit (HOSPITAL_COMMUNITY): Payer: Self-pay

## 2023-08-26 ENCOUNTER — Telehealth (HOSPITAL_COMMUNITY): Payer: Self-pay

## 2023-08-26 ENCOUNTER — Other Ambulatory Visit (HOSPITAL_COMMUNITY): Payer: Self-pay

## 2023-08-26 ENCOUNTER — Other Ambulatory Visit: Payer: Self-pay

## 2023-08-26 NOTE — Telephone Encounter (Signed)
Advanced Heart Failure Patient Advocate Encounter  Medication Samples have been left at registration desk for patient pick up. Drug name: Eliquis 5MG  Qty: 4x 14 ct packages LOT: [id number] Exp.: 10/2024 SIG: Take 1 tablet by mouth twice daily   The patient has been instructed regarding the correct time, dose, and frequency of taking this medication, including desired effects and most common side effects.   Burnell Blanks, CPhT Rx Patient Advocate Phone: [phone]

## 2023-08-27 ENCOUNTER — Other Ambulatory Visit: Payer: Self-pay

## 2023-08-27 ENCOUNTER — Other Ambulatory Visit (HOSPITAL_COMMUNITY): Payer: Self-pay

## 2023-08-28 ENCOUNTER — Other Ambulatory Visit: Payer: Self-pay

## 2023-09-03 NOTE — Progress Notes (Signed)
 Date:  09/10/2023   ID:  Stanley Wells, DOB 1946/07/27, MRN [id number]   Provider location: [facility] Type of Visit: Established patient   PCP:  Stanley Dene [name], DO  Cardiologist: Dr. Rolan   History of Present Illness: Stanley Wells is a 78 y.o. male who has a history of CAD, ischemic cardiomyopathy/chronic systolic CHF, severe functional mitral regurgitation, and persistent atrial fibrillation.  Patient had MI then CABG in 2003.  Cardiac cath in 2015 showed only LIMA-LAD and SVG-OM patent.  In 2015, EF was 30-35%.  He had atrial flutter in 2015 and again in 11/16, both times was cardioverted.  After the 11/16 cardioversion, he stopped all his meds.  He was admitted again in 12/16 with atrial fibrillation/RVR and a CHF exacerbation.  He was diuresed and started on Eliquis  with plan for DCCV after 4 weeks of anticoagulation.  He was brought back at the end of December for cardioversion, but this was unsuccessful.  TEE in 12/16 showed EF 20-25% with severe functional MR.     He was seen by [name] with plan for tentative plan for mitral valve repair versus replacement and possible tricuspid valve repair along with MAZE procedure.  He was referred to CHF clinic for optimization prior to surgery.    He went for left and right heart cath. RHC showed good left and right heart filling pressures but cardiac output was low.  Coronary angiography was stable with patent LIMA-LAD and SVG-OM.  Other SVGs occluded.     He had DCCV in 3/17 on amiodarone , this was later stopped.  He had a CPX in 3/17 with only mild functional impairment from CHF.     He had appendicitis with appendectomy in 12/17.    Evaluated in [facility] in 07/23/2018. He was in atrial flutter in the setting of a URI and underwent DC-CV.  He was put back on amiodarone . Amiodarone  later stopped.   Echo in 7/20 showed EF 45-50% with septal bounce on my review with mild LV dilation, normal RV size and systolic  function, IVC normal.   Echo in 8/21 showed EF 45-50% with mild LVH, diffuse hypokinesis, normal RV, no MR.   Patient had atrial fibrillation ablation in 9/21.  He had ERAF and DCCV in 10/21.    Patient was admitted in 6/22 with left-sided weakness, found to have lacunar infarct posterior limb of right internal capsule.  Echo in 6/22 showed EF 60-65%, severe LVH, normal RV, no MR.  CTA neck in 6/22 showed < 50% ICA stenosis. He was started on ASA 81 in addition to Eliquis  (had been taking Eliquis  regularly).   Echo 9/23 EF 50%, mild LVH and mild LV dilation, mildly decreased RV systolic function, normal RV, trivial MR.   Admitted 1/24 with acute gastroenteritis vs food poisoning with NAGMA. AKI 2/2 major GI losses. Symptoms improved almost on admission day. NAGMA resolved with bicarb gtt. Entresto  and Imdur  held with AKI and soft BP.   Today he returns for post hospital follow up with his wife. Overall feeling much better since discharged. Denies palpitations, CP, dizziness, edema, or PND/Orthopnea. No SOB. Appetite good. No fever or chills. Weight at home 202 pounds. SBP in 130s at home.  Taking all medications, concerned about meds stopped while admitted. Denies ETOH, tobacco or drug use.      PMH: 1. CAD: s/p MI in 2003.  CABG x 5 in 2003 with LIMA-LAD, SVG-D1, SVG-OM2, seq SVG-PDA/PLV.  LHC/RHC 11/15 with native arteries essentially occluded.  SVG-D1 and seq SVG-PDA/PLV occluded, LIMA and SVG-OM patent.  LHC (1/17) with patent LIMA-LAD, occluded SVG-D, patent SVG-OM => SVG touches down on inferior branch of OM1 that backfills to superior branch of OM1, there are serial 70-80% stenoses in the proximal inferior OM1 branch that may compromise backflow to superior branch OM1, SVG-PDA/PLV totally occluded, EF 25% . 2. Atrial flutter: DCCV 2015.  Recurred 11/16 with DCCV.   3. Chronic systolic CHF: Ischemic cardiomyopathy.  Echo (2015) with moderate-severe MR, EF 30-35%.  TEE (12/16) with severe  central MR (functional), EF 20-25% with diffuse hypokinesis, moderately decreased RV systolic function, moderate TR.  RHC (1/17) with mean RA 5, PA 27/12 mean 18, mean PCWP 10, CI 1.7 Fick/1.83 thermo. EF 25% on 1/17 LV-gram.  Cardiac MRI (2/17) with EF 19%, dilated LV, images not adequate to assess delayed enhancement.  - CPX (3/17) with peak VO2 20.4, VE/VCO2 slope 34, RER 1.11 => mild functional impairment.  - Echo (5/17) with EF 40-45%, trivial MR, mild RV dilation with low normal RV systolic function.  - Echo (5/18) with EF 40-45%, basal to mid inferior HK, moderate LVH, trivial MR.  - Echo (5/19) with EF 55-60%, no significant MR.  - Echo (7/20) with EF 45-50% on my review with mild LV dilation, normal RV size and systolic function, IVC normal, trivial MR.   - Echo (8/21) with EF 45-50% with mild LVH, diffuse hypokinesis, normal RV, no MR.  - Echo (6/22): EF 60-65%, severe LVH, normal RV, no MR - Echo (9/23): EF 50%, mild LVH and mild LV dilation, mildly decreased RV systolic function, normal RV, trivial MR. 4. Mitral regurgitation: Severe on 12/16 TEE, but was only trivial on echo in 5/17 and 5/18 with aggressive medical treatment. 5/19 echo with no significant MR. 8/21 echo with no significant MR.  5. OSA: Not using CPAP.  6. HTN 7. Nephrolithiasis 8. Atrial fibrillation/flutter: Noted in 12/16 initially.  Paroxysmal.  Had DCCV to NSR in 3/17.  - Atrial flutter 12/19 => DCCV to NSR, briefly on amiodarone .  - Atrial fibrillation ablation 9/21.  - DCCV to NSR 10/21 9. PFTs (1/17) with minimal obstructive airways disease but severely decreased DLCO (37% predicted), possibly suggestive of pulmonary vascular disease.  10. ABIs (5/17) were normal.  11. CKD 12. Appendectomy 12/17. 13. CVA: 6/22. Lacunar infarct posterior limb of right internal capsule.  CTA neck in 6/22 showed < 50% ICA stenosis. - Carotid dopplers (6/23) with mild BICA stenosis.  - Carotid dopplers (6/24) with mild BICA  stenosis.  14. B12 deficiency    Current Outpatient Medications  Medication Sig Dispense Refill   apixaban  (ELIQUIS ) 5 MG TABS tablet Take 1 tablet (5 mg total) by mouth 2 (two) times daily. 180 tablet 3   aspirin  EC 81 MG EC tablet Take 1 tablet (81 mg total) by mouth daily. Swallow whole. 30 tablet 11   metoprolol  succinate (TOPROL -XL) 25 MG 24 hr tablet Take 0.5 tablets (12.5 mg total) by mouth 2 (two) times daily. Take with or immediately following a meal. 30 tablet 0   pantoprazole  (PROTONIX ) 40 MG tablet Take 1 tablet (40 mg total) by mouth daily. 30 tablet 0   rosuvastatin  (CRESTOR ) 40 MG tablet Take 1 tablet (40 mg total) by mouth daily. 90 tablet 3   sacubitril -valsartan  (ENTRESTO ) 97-103 MG Take 1 tablet by mouth 2 (two) times daily.     No current facility-administered medications for this encounter.  Allergies:   Patient has no known allergies.   Social History:  The patient  reports that he has been smoking cigarettes. He started smoking about 61 years ago. He has never used smokeless tobacco. He reports current alcohol use. He reports that he does not use drugs.   Family History:  The patient's family history includes Cancer in his father and mother.   ROS:  Please see the history of present illness.   All other systems are personally reviewed and negative.   Exam:   BP (!) 140/94   Pulse 74   Wt 93.3 kg (205 lb 9.6 oz)   SpO2 97%   BMI 28.68 kg/m  General:  well appearing.  No respiratory difficulty. Walked into clinic HEENT: normal Neck: supple. JVD flat. Carotids 2+ bilat; no bruits. No lymphadenopathy or thyromegaly appreciated. Cor: PMI nondisplaced. Regular rate & rhythm. No rubs, gallops or murmurs. Lungs: clear Abdomen: soft, nontender, nondistended. No hepatosplenomegaly. No bruits or masses. Good bowel sounds. Extremities: no cyanosis, clubbing, rash, edema  Neuro: alert & oriented x 3, cranial nerves grossly intact. moves all 4 extremities w/o  difficulty. Affect pleasant.   Recent Labs: 04/28/2023: B Natriuretic Peptide 87.1 08/15/2023: ALT 38 08/18/2023: BUN 39; Creatinine, Ser 1.59; Hemoglobin 13.7; Platelets 192; Potassium 3.6; Sodium 137  Personally reviewed   Wt Readings from Last 3 Encounters:  09/10/23 93.3 kg (205 lb 9.6 oz)  08/15/23 93.4 kg (205 lb 14.6 oz)  04/28/23 93.4 kg (206 lb)    EKG: NSR with PACs 72 bpm (Personally reviewed)    ASSESSMENT AND PLAN:  1. Chronic systolic CHF: EF 79-74% on TEE from 12/16.  cMRI 2/17 with EF 19%, unable to assess for delayed enhancement on this study.  Ischemic cardiomyopathy.  Low cardiac output by RHC in 1/17 but filling pressures optimized.  Despite low output on that RHC, he has been doing quite well with medical treatment.  CPX (3/17) with only mild functional limitation.  Echo in 8/21 with EF 45-50%, normal RV size and systolic function, IVC normal, no significant MR.  Echo 9/23 EF 50%, mild LVH and mild LV dilation, mildly decreased RV systolic function, normal RV, trivial MR.  Echo 10/24: EF 50-55%, no RWMA, RV normal, trivial MR - NYHA class II, not volume overloaded on exam.  - Increase Toprol  XL 12.5>25 (previously on 50) mg bid.   - Continue Entresto  97/103 bid.  BMET/BNP today.  - Spiro previously stopped due to hyperkalemia    - Restart Imdur  30 mg daily  - Does not need daily loop diuretic  2. CAD: s/p CABG.  On 1/17 cath, only LIMA-LAD and SVG-OM2 still patent. No chest pain.  - Continue ASA 81 daily.  - Continue Crestor  40 mg.  Lipids stable 9/24 48 3. Atrial fibrillation/flutter: Paroxysmal.  Successful DCCV in 3/17 on amiodarone .  Amiodarone  stopped, then he had atrial flutter in 12/19 that was cardioverted back to NSR and amiodarone  was restarted transiently.  He had atrial fibrillation ablation in 9/21 and DCCV again in 10/21. He had a CVA in 6/22 and reports compliance with Eliquis .  Now taking both ASA 81 and Eliquis . In NSR with PACs today - Continue apixaban   + ASA 81 mg. Denies abnormal bleeding. CBC today.  4. Mitral regurgitation: Severe by 12/16 TEE, probably functional.  Much improved on 5/17 echo, only trivial.  Trivial on 5/18 echo. No significant MR on echo 12/2017. Trivial on 7/20 echo. Minimal MR on 8/21 echo. Minimal MR on  6/22 echo. Trivial MR on 9/23 echo. Trivial MR on 10/24 echo 5. CKD: Stage III.  - BMET today.    Follow- up as scheduled with Dr. Rolan  Signed, Beckey LITTIE Coe, NP  09/10/2023  [facility] [address] [facility] [place] [address] [phone] (office) [phone] (fax)

## 2023-09-10 ENCOUNTER — Ambulatory Visit (HOSPITAL_COMMUNITY)
Admission: RE | Admit: 2023-09-10 | Discharge: 2023-09-10 | Disposition: A | Discharge status: Home / Self Care | Payer: Medicare Other | Source: Ambulatory Visit | Attending: Family Medicine | Admitting: Family Medicine

## 2023-09-10 ENCOUNTER — Other Ambulatory Visit: Payer: Self-pay

## 2023-09-10 ENCOUNTER — Other Ambulatory Visit (HOSPITAL_COMMUNITY): Payer: Self-pay

## 2023-09-10 ENCOUNTER — Encounter (HOSPITAL_COMMUNITY): Payer: Self-pay

## 2023-09-10 VITALS — BP 140/94 | HR 74 | Wt 205.6 lb

## 2023-09-10 DIAGNOSIS — I2510 Atherosclerotic heart disease of native coronary artery without angina pectoris: Secondary | ICD-10-CM | POA: Insufficient documentation

## 2023-09-10 DIAGNOSIS — I340 Nonrheumatic mitral (valve) insufficiency: Secondary | ICD-10-CM | POA: Insufficient documentation

## 2023-09-10 DIAGNOSIS — I255 Ischemic cardiomyopathy: Secondary | ICD-10-CM | POA: Insufficient documentation

## 2023-09-10 DIAGNOSIS — I252 Old myocardial infarction: Secondary | ICD-10-CM | POA: Diagnosis not present

## 2023-09-10 DIAGNOSIS — F17210 Nicotine dependence, cigarettes, uncomplicated: Secondary | ICD-10-CM | POA: Diagnosis not present

## 2023-09-10 DIAGNOSIS — I4892 Unspecified atrial flutter: Secondary | ICD-10-CM | POA: Diagnosis not present

## 2023-09-10 DIAGNOSIS — Z79899 Other long term (current) drug therapy: Secondary | ICD-10-CM | POA: Insufficient documentation

## 2023-09-10 DIAGNOSIS — I130 Hypertensive heart and chronic kidney disease with heart failure and stage 1 through stage 4 chronic kidney disease, or unspecified chronic kidney disease: Secondary | ICD-10-CM | POA: Insufficient documentation

## 2023-09-10 DIAGNOSIS — Z7982 Long term (current) use of aspirin: Secondary | ICD-10-CM | POA: Insufficient documentation

## 2023-09-10 DIAGNOSIS — Z8673 Personal history of transient ischemic attack (TIA), and cerebral infarction without residual deficits: Secondary | ICD-10-CM | POA: Diagnosis not present

## 2023-09-10 DIAGNOSIS — N1832 Chronic kidney disease, stage 3b: Secondary | ICD-10-CM

## 2023-09-10 DIAGNOSIS — I4891 Unspecified atrial fibrillation: Secondary | ICD-10-CM | POA: Diagnosis not present

## 2023-09-10 DIAGNOSIS — Z951 Presence of aortocoronary bypass graft: Secondary | ICD-10-CM | POA: Insufficient documentation

## 2023-09-10 DIAGNOSIS — I5022 Chronic systolic (congestive) heart failure: Secondary | ICD-10-CM | POA: Insufficient documentation

## 2023-09-10 DIAGNOSIS — I4819 Other persistent atrial fibrillation: Secondary | ICD-10-CM | POA: Insufficient documentation

## 2023-09-10 DIAGNOSIS — Z7901 Long term (current) use of anticoagulants: Secondary | ICD-10-CM | POA: Insufficient documentation

## 2023-09-10 DIAGNOSIS — N183 Chronic kidney disease, stage 3 unspecified: Secondary | ICD-10-CM | POA: Insufficient documentation

## 2023-09-10 LAB — BASIC METABOLIC PANEL
Anion gap: 10 (ref 5–15)
BUN: 20 mg/dL (ref 8–23)
CO2: 26 mmol/L (ref 22–32)
Calcium: 9.3 mg/dL (ref 8.9–10.3)
Chloride: 103 mmol/L (ref 98–111)
Creatinine, Ser: 1.19 mg/dL (ref 0.61–1.24)
GFR, Estimated: >60 mL/min (ref >60)
Glucose, Bld: 118 mg/dL — ABNORMAL HIGH (ref 70–99)
Potassium: 4.3 mmol/L (ref 3.5–5.1)
Sodium: 139 mmol/L (ref 135–145)

## 2023-09-10 LAB — CBC
HCT: 45.6 % (ref 39.0–52.0)
Hemoglobin: 14.5 g/dL (ref 13.0–17.0)
MCH: 29.5 pg (ref 26.0–34.0)
MCHC: 31.8 g/dL (ref 30.0–36.0)
MCV: 92.7 fL (ref 80.0–100.0)
Platelets: 237 10*3/uL (ref 150–400)
RBC: 4.92 MIL/uL (ref 4.22–5.81)
RDW: 14.8 % (ref 11.5–15.5)
WBC: 8.6 10*3/uL (ref 4.0–10.5)
nRBC: 0 % (ref 0.0–0.2)

## 2023-09-10 LAB — BRAIN NATRIURETIC PEPTIDE: B Natriuretic Peptide: 73.2 pg/mL (ref 0.0–100.0)

## 2023-09-10 MED ORDER — ENTRESTO 97-103 MG PO TABS
1.0000 | ORAL_TABLET | Freq: Two times a day (BID) | ORAL | 3 refills | Status: DC
  Filled 2023-09-10 – 2023-10-06 (×2): qty 60, 30d supply, fill #0
  Filled 2023-11-04: qty 60, 30d supply, fill #1
  Filled 2023-12-03: qty 60, 30d supply, fill #2
  Filled 2024-01-01: qty 60, 30d supply, fill #3

## 2023-09-10 MED ORDER — ISOSORBIDE MONONITRATE ER 30 MG PO TB24
30.0000 mg | ORAL_TABLET | Freq: Every day | ORAL | 3 refills | Status: DC
  Filled 2023-09-10: qty 30, 30d supply, fill #0
  Filled 2023-10-06: qty 30, 30d supply, fill #1
  Filled 2023-11-04: qty 30, 30d supply, fill #2
  Filled 2023-12-03: qty 30, 30d supply, fill #3

## 2023-09-10 MED ORDER — METOPROLOL SUCCINATE ER 25 MG PO TB24
25.0000 mg | ORAL_TABLET | Freq: Two times a day (BID) | ORAL | 3 refills | Status: DC
  Filled 2023-09-10: qty 60, 30d supply, fill #0
  Filled 2023-10-06: qty 60, 30d supply, fill #1
  Filled 2023-11-04: qty 60, 30d supply, fill #2
  Filled 2023-12-03: qty 60, 30d supply, fill #3

## 2023-09-10 MED ORDER — PANTOPRAZOLE SODIUM 40 MG PO TBEC
40.0000 mg | DELAYED_RELEASE_TABLET | Freq: Every day | ORAL | 0 refills | Status: DC
  Filled 2023-09-10: qty 30, 30d supply, fill #0

## 2023-09-10 MED ORDER — ROSUVASTATIN CALCIUM 40 MG PO TABS
40.0000 mg | ORAL_TABLET | Freq: Every day | ORAL | 3 refills | Status: AC
  Filled 2023-09-10: qty 90, 90d supply, fill #0
  Filled 2024-01-01: qty 90, 90d supply, fill #1
  Filled 2024-04-11: qty 90, 90d supply, fill #2
  Filled 2024-07-11: qty 90, 90d supply, fill #3

## 2023-09-10 NOTE — Progress Notes (Signed)
 Medication Samples have been provided to the patient.  Drug name: Eliquis        Strength: 5 mg        Qty: 28  LOT: [id number]  Exp.Date: 08/2024  Dosing instructions: Patient takes 1 tablet by mouth twice a day.  The patient has been instructed regarding the correct time, dose, and frequency of taking this medication, including desired effects and most common side effects.   Jeannine HERO Adside 10:16 AM 09/10/2023

## 2023-09-10 NOTE — Patient Instructions (Signed)
 Medication Changes:  RESTART ISOSORBIDE  30MG  ONCE DAILY   INCREASE METOPROLOL  TO 25MG  TWICE DAILY   REFILLS SENT TO PHARMACY   Lab Work:  Labs done today, your results will be available in MyChart, we will contact you for abnormal readings.  Follow-Up in: AS SCHEDULED   At the [facility], you and your health needs are our priority. We have a designated team specialized in the treatment of Heart Failure. This Care Team includes your primary Heart Failure Specialized Cardiologist (physician), Advanced Practice Providers (APPs- Physician Assistants and Nurse Practitioners), and Pharmacist who all work together to provide you with the care you need, when you need it.   You may see any of the following providers on your designated Care Team at your next follow up:  Dr. Toribio Fuel Dr. Ezra Shuck Dr. Ria Commander Dr. Odis Brownie Greig Mosses, NP Caffie Shed, [place] [facility] [place], [place] Beckey Coe, NP Jordan Lee, NP Tinnie Redman, PharmD   Please be sure to bring in all your medications bottles to every appointment.   Need to Contact Us :  If you have any questions or concerns before your next appointment please send us  a message through [place] or call our office at [phone].    TO LEAVE A MESSAGE FOR THE NURSE SELECT OPTION 2, PLEASE LEAVE A MESSAGE INCLUDING: YOUR NAME DATE OF BIRTH CALL BACK NUMBER REASON FOR CALL**this is important as we prioritize the call backs  YOU WILL RECEIVE A CALL BACK THE SAME DAY AS LONG AS YOU CALL BEFORE 4:00 PM

## 2023-09-10 NOTE — Addendum Note (Signed)
 Encounter addended by: Kellen Dutch M, CMA on: 09/10/2023 10:19 AM  Actions taken: Clinical Note Signed

## 2023-09-10 NOTE — Telephone Encounter (Signed)
 Patient was provided with samples while in office today. Returning duplicates to sample storage.

## 2023-10-06 ENCOUNTER — Other Ambulatory Visit: Payer: Self-pay

## 2023-10-06 ENCOUNTER — Telehealth (HOSPITAL_COMMUNITY): Payer: Self-pay

## 2023-10-06 ENCOUNTER — Other Ambulatory Visit (HOSPITAL_COMMUNITY): Payer: Self-pay

## 2023-10-06 NOTE — Telephone Encounter (Signed)
 [facility] Patient Advocate Encounter  Patient's wife left message regarding grant information. I have added the billing info to [facility] and confirmed that the 3 medications being filled have been processed correctly. Informed patient's wife by phone.  Burnell Blanks, CPhT Rx Patient Advocate Phone: [phone]

## 2023-10-07 ENCOUNTER — Other Ambulatory Visit: Payer: Self-pay

## 2023-10-27 ENCOUNTER — Encounter (HOSPITAL_COMMUNITY): Payer: Self-pay | Admitting: Cardiology

## 2023-10-27 ENCOUNTER — Ambulatory Visit (HOSPITAL_COMMUNITY)
Admission: RE | Admit: 2023-10-27 | Discharge: 2023-10-27 | Disposition: A | Discharge status: Home / Self Care | Payer: Medicare Other | Source: Ambulatory Visit | Attending: Cardiology | Admitting: Cardiology

## 2023-10-27 VITALS — BP 118/80 | HR 77 | Wt 205.4 lb

## 2023-10-27 DIAGNOSIS — Z79899 Other long term (current) drug therapy: Secondary | ICD-10-CM | POA: Diagnosis not present

## 2023-10-27 DIAGNOSIS — I4819 Other persistent atrial fibrillation: Secondary | ICD-10-CM | POA: Insufficient documentation

## 2023-10-27 DIAGNOSIS — Z8673 Personal history of transient ischemic attack (TIA), and cerebral infarction without residual deficits: Secondary | ICD-10-CM | POA: Insufficient documentation

## 2023-10-27 DIAGNOSIS — I130 Hypertensive heart and chronic kidney disease with heart failure and stage 1 through stage 4 chronic kidney disease, or unspecified chronic kidney disease: Secondary | ICD-10-CM | POA: Insufficient documentation

## 2023-10-27 DIAGNOSIS — I4892 Unspecified atrial flutter: Secondary | ICD-10-CM | POA: Diagnosis not present

## 2023-10-27 DIAGNOSIS — I5022 Chronic systolic (congestive) heart failure: Secondary | ICD-10-CM | POA: Insufficient documentation

## 2023-10-27 DIAGNOSIS — Z951 Presence of aortocoronary bypass graft: Secondary | ICD-10-CM | POA: Insufficient documentation

## 2023-10-27 DIAGNOSIS — I2510 Atherosclerotic heart disease of native coronary artery without angina pectoris: Secondary | ICD-10-CM | POA: Insufficient documentation

## 2023-10-27 DIAGNOSIS — I255 Ischemic cardiomyopathy: Secondary | ICD-10-CM | POA: Diagnosis not present

## 2023-10-27 DIAGNOSIS — I252 Old myocardial infarction: Secondary | ICD-10-CM | POA: Insufficient documentation

## 2023-10-27 DIAGNOSIS — I5042 Chronic combined systolic (congestive) and diastolic (congestive) heart failure: Secondary | ICD-10-CM

## 2023-10-27 DIAGNOSIS — I340 Nonrheumatic mitral (valve) insufficiency: Secondary | ICD-10-CM | POA: Insufficient documentation

## 2023-10-27 DIAGNOSIS — Z7901 Long term (current) use of anticoagulants: Secondary | ICD-10-CM | POA: Diagnosis not present

## 2023-10-27 DIAGNOSIS — F17210 Nicotine dependence, cigarettes, uncomplicated: Secondary | ICD-10-CM | POA: Insufficient documentation

## 2023-10-27 DIAGNOSIS — Z7982 Long term (current) use of aspirin: Secondary | ICD-10-CM | POA: Insufficient documentation

## 2023-10-27 DIAGNOSIS — N183 Chronic kidney disease, stage 3 unspecified: Secondary | ICD-10-CM | POA: Insufficient documentation

## 2023-10-27 LAB — BASIC METABOLIC PANEL
Anion gap: 6 (ref 5–15)
BUN: 26 mg/dL — ABNORMAL HIGH (ref 8–23)
CO2: 24 mmol/L (ref 22–32)
Calcium: 9.2 mg/dL (ref 8.9–10.3)
Chloride: 109 mmol/L (ref 98–111)
Creatinine, Ser: 1.2 mg/dL (ref 0.61–1.24)
GFR, Estimated: >60 mL/min (ref >60)
Glucose, Bld: 111 mg/dL — ABNORMAL HIGH (ref 70–99)
Potassium: 4.4 mmol/L (ref 3.5–5.1)
Sodium: 139 mmol/L (ref 135–145)

## 2023-10-27 LAB — LIPID PANEL
Cholesterol: 121 mg/dL (ref 0–200)
HDL: 42 mg/dL (ref >40)
LDL Cholesterol: 52 mg/dL (ref 0–99)
Total CHOL/HDL Ratio: 2.9 ratio
Triglycerides: 135 mg/dL (ref <150)
VLDL: 27 mg/dL (ref 0–40)

## 2023-10-27 LAB — BRAIN NATRIURETIC PEPTIDE: B Natriuretic Peptide: 73.3 pg/mL (ref 0.0–100.0)

## 2023-10-27 NOTE — Progress Notes (Signed)
 Date:  10/27/2023   ID:  TINA TEMME, DOB 10-11-1945, MRN [id number]   Provider location: [facility] Type of Visit: Established patient   PCP:  Annamaria Helling, DO  Cardiologist: Dr. Shirlee Latch   History of Present Illness: Stanley Wells is a 78 y.o. male who has a history of Wells, Stanley Wells, Stanley Wells, Stanley Wells.  Patient had MI then CABG in 2003.  Cardiac cath in 2015 showed only LIMA-LAD Stanley SVG-OM patent.  In 2015, EF was 30-35%.  He had atrial flutter in 2015 Stanley again in 11/16, both times was cardioverted.  After the 11/16 cardioversion, he stopped all his meds.  He was admitted again in 12/16 with atrial Wells/RVR Stanley a Wells exacerbation.  He was diuresed Stanley started on Eliquis with plan for DCCV after 4 weeks of anticoagulation.  He was brought back at the end of December for cardioversion, but this was unsuccessful.  TEE in 12/16 showed EF 20-25% with Stanley functional MR.     He was seen by Dr [name] with plan for tentative plan for mitral valve repair versus replacement Stanley possible tricuspid valve repair along with MAZE procedure.  He was referred to Wells clinic for optimization prior to surgery.    He went for left Stanley right heart cath. RHC showed good left Stanley right heart filling pressures but cardiac output was low.  Coronary angiography was stable with patent LIMA-LAD Stanley SVG-OM.  Other SVGs occluded.     He had DCCV in 3/17 on amiodarone, this was later stopped.  He had a CPX in 3/17 with only mild functional impairment from Wells.     He had appendicitis with appendectomy in 12/17.    Evaluated in [facility] in 07/23/2018. He was in atrial flutter in the setting of a URI Stanley underwent DC-CV.  He was put back on amiodarone. Amiodarone later stopped.   Echo in 7/20 showed EF 45-50% with septal bounce on my review with mild LV dilation, normal RV size Stanley  systolic function, IVC normal.   Echo in 8/21 showed EF 45-50% with mild LVH, diffuse hypokinesis, normal RV, no MR.   Patient had atrial Wells ablation in 9/21.  He had ERAF Stanley DCCV in 10/21.    Patient was admitted in 6/22 with left-sided weakness, found to have lacunar infarct posterior limb of right internal capsule.  Echo in 6/22 showed EF 60-65%, Stanley LVH, normal RV, no MR.  CTA neck in 6/22 showed < 50% ICA stenosis. He was started on ASA 81 in addition to Eliquis (had been taking Eliquis regularly).   Echo 9/23 EF 50%, mild LVH Stanley mild LV dilation, mildly decreased RV systolic function, normal RV, trivial MR. Echo in 10/24 showed EF 50-55%, mild LVH, normal RV, trivial MR.   Admitted 1/24 with acute gastroenteritis vs food poisoning.  AKI 2/2 major GI losses. Symptoms improved almost on admission day. NAGMA resolved with bicarb gtt.  He returns today for followup of Wells Stanley Wells.  No exertional dyspnea or chest pain.  No lightheadedness or palpitations.  He is in NSR today.  Overall feels good.  BP not elevated today, but his wife is worried that it gets high at times.   ECG (personally reviewed): NSR, PAC  Labs (9/24): LDL 48 Labs (2/25): BNP 73, K 4.3, creatinine 1.19   PMH: 1. Wells: s/p MI in 2003.  CABG x 5 in 2003  with LIMA-LAD, SVG-D1, SVG-OM2, seq SVG-PDA/PLV.  LHC/RHC 11/15 with native arteries essentially occluded.  SVG-D1 Stanley seq SVG-PDA/PLV occluded, LIMA Stanley SVG-OM patent.  LHC (1/17) with patent LIMA-LAD, occluded SVG-D, patent SVG-OM => SVG touches down on inferior branch of OM1 that backfills to superior branch of OM1, there are serial 70-80% stenoses in the proximal inferior OM1 branch that may compromise backflow to superior branch OM1, SVG-PDA/PLV totally occluded, EF 25% . 2. Atrial flutter: DCCV 2015.  Recurred 11/16 with DCCV.   3. Chronic systolic Wells: Stanley cardiomyopathy.  Echo (2015) with moderate-Stanley MR, EF 30-35%.  TEE (12/16) with Stanley  central MR (functional), EF 20-25% with diffuse hypokinesis, moderately decreased RV systolic function, moderate TR.  RHC (1/17) with mean RA 5, PA 27/12 mean 18, mean PCWP 10, CI 1.7 Fick/1.83 thermo. EF 25% on 1/17 LV-gram.  Cardiac MRI (2/17) with EF 19%, dilated LV, images not adequate to assess delayed enhancement.  - CPX (3/17) with peak VO2 20.4, VE/VCO2 slope 34, RER 1.11 => mild functional impairment.  - Echo (5/17) with EF 40-45%, trivial MR, mild RV dilation with low normal RV systolic function.  - Echo (5/18) with EF 40-45%, basal to mid inferior HK, moderate LVH, trivial MR.  - Echo (5/19) with EF 55-60%, no significant MR.  - Echo (7/20) with EF 45-50% on my review with mild LV dilation, normal RV size Stanley systolic function, IVC normal, trivial MR.   - Echo (8/21) with EF 45-50% with mild LVH, diffuse hypokinesis, normal RV, no MR.  - Echo (6/22): EF 60-65%, Stanley LVH, normal RV, no MR - Echo (9/23): EF 50%, mild LVH Stanley mild LV dilation, mildly decreased RV systolic function, normal RV, trivial MR. - Echo (10/24): EF 50-55%, mild LVH, normal RV, trivial MR.  4. Mitral Wells: Stanley on 12/16 TEE, but was only trivial on echo in 5/17 Stanley 5/18 with aggressive medical treatment. 5/19 echo with no significant MR. 8/21 echo with no significant MR.  - Echo in 10/24 with trivial MR.  5. OSA: Not using CPAP.  6. HTN 7. Nephrolithiasis 8. Atrial Wells/flutter: Noted in 12/16 initially.  Paroxysmal.  Had DCCV to NSR in 3/17.  - Atrial flutter 12/19 => DCCV to NSR, briefly on amiodarone.  - Atrial Wells ablation 9/21.  - DCCV to NSR 10/21 9. PFTs (1/17) with minimal obstructive airways disease but severely decreased DLCO (37% predicted), possibly suggestive of pulmonary vascular disease.  10. ABIs (5/17) were normal.  11. CKD 12. Appendectomy 12/17. 13. CVA: 6/22. Lacunar infarct posterior limb of right internal capsule.  CTA neck in 6/22 showed < 50% ICA stenosis. -  Carotid dopplers (6/23) with mild BICA stenosis.  - Carotid dopplers (6/24) with mild BICA stenosis.  14. B12 deficiency    Current Outpatient Medications  Medication Sig Dispense Refill   apixaban (ELIQUIS) 5 MG TABS tablet Take 1 tablet (5 mg total) by mouth 2 (two) times daily. 180 tablet 3   aspirin EC 81 MG EC tablet Take 1 tablet (81 mg total) by mouth daily. Swallow whole. 30 tablet 11   isosorbide mononitrate (IMDUR) 30 MG 24 hr tablet Take 1 tablet (30 mg total) by mouth daily. 30 tablet 3   metoprolol succinate (TOPROL-XL) 25 MG 24 hr tablet Take 1 tablet (25 mg total) by mouth 2 (two) times daily. Take with or immediately following a meal. 60 tablet 3   rosuvastatin (CRESTOR) 40 MG tablet Take 1 tablet (40 mg total) by mouth daily.  90 tablet 3   sacubitril-valsartan (ENTRESTO) 97-103 MG Take 1 tablet by mouth 2 (two) times daily. 60 tablet 3   No current facility-administered medications for this encounter.    Allergies:   Patient has no known allergies.   Social History:  The patient  reports that he has been smoking cigarettes. He started smoking about 61 years ago. He has never used smokeless tobacco. He reports current alcohol use. He reports that he does not use drugs.   Family History:  The patient's family history includes Cancer in his father Stanley mother.   ROS:  Please see the history of present illness.   All other systems are personally reviewed Stanley negative.   Exam:   BP 118/80   Pulse 77   Wt 93.2 kg (205 lb 6.4 oz)   SpO2 95%   BMI 28.65 kg/m  General: NAD Neck: No JVD, no thyromegaly or thyroid nodule.  Lungs: Clear to auscultation bilaterally with normal respiratory effort. CV: Nondisplaced PMI.  Heart regular S1/S2, no S3/S4, no murmur.  No peripheral edema.  No carotid bruit.  Normal pedal pulses.  Abdomen: Soft, nontender, no hepatosplenomegaly, no distention.  Skin: Intact without lesions or rashes.  Neurologic: Alert Stanley oriented x 3.  Psych:  Normal affect. Extremities: No clubbing or cyanosis.  HEENT: Normal.   Recent Labs: 08/15/2023: ALT 38 09/10/2023: Hemoglobin 14.5; Platelets 237 10/27/2023: B Natriuretic Peptide 73.3; BUN 26; Creatinine, Ser 1.20; Potassium 4.4; Sodium 139  Personally reviewed   Wt Readings from Last 3 Encounters:  10/27/23 93.2 kg (205 lb 6.4 oz)  09/10/23 93.3 kg (205 lb 9.6 oz)  08/15/23 93.4 kg (205 lb 14.6 oz)    ASSESSMENT Stanley PLAN:  1. Chronic systolic Wells: EF 16-10% on TEE from 12/16.  cMRI 2/17 with EF 19%, unable to assess for delayed enhancement on this study.  Stanley cardiomyopathy.  Low cardiac output by RHC in 1/17 but filling pressures optimized.  Despite low output on that RHC, he has been doing quite well with medical treatment. CPX (3/17) with only mild functional limitation.  LV EF has improved over the last few years.  Echo 10/24 with EF 50-55%, no RWMA, RV normal, trivial MR.  NYHA class I-II, not volume overloaded on exam.  - Continue Toprol XL 25 mg bid.  - Continue Entresto 97/103 bid.  BMET/BNP today.  - Spironolactone previously stopped due to hyperkalemia    - Does not need daily loop diuretic  2. Wells: s/p CABG.  On 1/17 cath, only LIMA-LAD Stanley SVG-OM2 still patent. No chest pain.  - Continue ASA 81 daily.  - Continue Crestor 40 mg.  Check lipids today, goal LDL < 55.  3. Atrial Wells/flutter: Paroxysmal.  Successful DCCV in 3/17 on amiodarone.  Amiodarone stopped, then he had atrial flutter in 12/19 that was cardioverted back to NSR Stanley amiodarone was restarted transiently.  He had atrial Wells ablation in 9/21 Stanley DCCV again in 10/21. He had a CVA in 6/22 Stanley reports compliance with Eliquis.  Now taking both ASA 81 Stanley Eliquis. In NSR today.  - Continue apixaban + ASA 81 mg.  4. Mitral Wells: Stanley by 12/16 TEE, probably functional.  Much improved on 5/17 echo, only trivial.  Trivial on 5/18 echo. No significant MR on echo 12/2017. Trivial on 7/20 echo.  Minimal MR on 8/21 echo. Minimal MR on 6/22 echo. Trivial MR on 9/23 echo. Trivial MR on 10/24 echo 5. CKD: Stage III. Creatinine recently improved.  -  BMET today.   6. HTN: BP is controlled today.  Wife is concerned that it is high at times.  - He will check BP daily x 2 wks Stanley call in readings.   Follow- up 6 months with APP.   I spent 32 minutes reviewing records, interviewing/examining patient, Stanley managing orders.    Signed, Marca Ancona, MD  10/27/2023  [facility] [address] [facility] [place] [address] [phone] (office) [phone] (fax)

## 2023-10-27 NOTE — Patient Instructions (Signed)
 There has been no changes to your medications.  Labs done today, your results will be available in MyChart, we will contact you for abnormal readings.  PLEASE CHECK YOUR BLOOD PRESSURE DAILY FOR 2 WEEKS, THEN CALL THE OFFICE WITH THE RESULTS  Your provider has ordered carotid dopplers for you. You will be called to have this scheduled.  Your physician recommends that you schedule a follow-up appointment in: 6 months  If you have any questions or concerns before your next appointment please send [place] a message through [place] or call our office at [phone].    TO LEAVE A MESSAGE FOR THE NURSE SELECT OPTION 2, PLEASE LEAVE A MESSAGE INCLUDING: YOUR NAME DATE OF BIRTH CALL BACK NUMBER REASON FOR CALL**this is important as we prioritize the call backs  YOU WILL RECEIVE A CALL BACK THE SAME DAY AS LONG AS YOU CALL BEFORE 4:00 PM  At the [facility], you and your health needs are our priority. As part of our continuing mission to provide you with exceptional heart care, we have created designated Provider Care Teams. These Care Teams include your primary Cardiologist (physician) and Advanced Practice Providers (APPs- Physician Assistants and Nurse Practitioners) who all work together to provide you with the care you need, when you need it.   You may see any of the following providers on your designated Care Team at your next follow up: Dr Arvilla Meres Dr Marca Ancona Dr. Dorthula Nettles Dr. Clearnce Hasten Amy Filbert Schilder, NP Robbie Lis, [place] [facility] [place], [place] Brynda Peon, NP [place] Lee, NP Clarisa Kindred, NP Karle Plumber, PharmD Enos Fling, PharmD   Please be sure to bring in all your medications bottles to every appointment.    Thank you for choosing [facility]

## 2023-11-04 ENCOUNTER — Other Ambulatory Visit (HOSPITAL_COMMUNITY): Payer: Self-pay

## 2023-11-24 ENCOUNTER — Other Ambulatory Visit (HOSPITAL_COMMUNITY): Payer: Self-pay

## 2023-12-03 ENCOUNTER — Other Ambulatory Visit (HOSPITAL_COMMUNITY): Payer: Self-pay

## 2023-12-17 ENCOUNTER — Other Ambulatory Visit (HOSPITAL_COMMUNITY): Payer: Self-pay

## 2023-12-17 MED ORDER — ENTRESTO 97-103 MG PO TABS
1.0000 | ORAL_TABLET | Freq: Two times a day (BID) | ORAL | 1 refills | Status: DC
  Filled 2024-02-01: qty 180, 90d supply, fill #0
  Filled 2024-05-02: qty 180, 90d supply, fill #1

## 2024-01-01 ENCOUNTER — Other Ambulatory Visit (HOSPITAL_COMMUNITY): Payer: Self-pay

## 2024-01-01 ENCOUNTER — Other Ambulatory Visit: Payer: Self-pay

## 2024-01-01 DIAGNOSIS — I5042 Chronic combined systolic (congestive) and diastolic (congestive) heart failure: Secondary | ICD-10-CM

## 2024-01-01 MED ORDER — METOPROLOL SUCCINATE ER 25 MG PO TB24
25.0000 mg | ORAL_TABLET | Freq: Two times a day (BID) | ORAL | 5 refills | Status: DC
  Filled 2024-01-01: qty 60, 30d supply, fill #0
  Filled 2024-02-01: qty 60, 30d supply, fill #1
  Filled 2024-03-07: qty 60, 30d supply, fill #2
  Filled 2024-04-11: qty 60, 30d supply, fill #3
  Filled 2024-05-09: qty 60, 30d supply, fill #4
  Filled 2024-06-07: qty 60, 30d supply, fill #5

## 2024-01-01 MED ORDER — ISOSORBIDE MONONITRATE ER 30 MG PO TB24
30.0000 mg | ORAL_TABLET | Freq: Every day | ORAL | 5 refills | Status: DC
  Filled 2024-01-01: qty 30, 30d supply, fill #0
  Filled 2024-02-01: qty 30, 30d supply, fill #1
  Filled 2024-03-07: qty 30, 30d supply, fill #2
  Filled 2024-04-11: qty 30, 30d supply, fill #3
  Filled 2024-05-09: qty 30, 30d supply, fill #4
  Filled 2024-06-07: qty 30, 30d supply, fill #5

## 2024-02-01 ENCOUNTER — Other Ambulatory Visit (HOSPITAL_COMMUNITY): Payer: Self-pay

## 2024-02-01 ENCOUNTER — Other Ambulatory Visit: Payer: Self-pay

## 2024-02-04 ENCOUNTER — Ambulatory Visit (HOSPITAL_COMMUNITY): Payer: Self-pay | Admitting: Cardiology

## 2024-02-04 ENCOUNTER — Ambulatory Visit (HOSPITAL_COMMUNITY)
Admission: RE | Admit: 2024-02-04 | Discharge: 2024-02-04 | Disposition: A | Discharge status: Home / Self Care | Source: Ambulatory Visit | Attending: Cardiology | Admitting: Cardiology

## 2024-02-04 DIAGNOSIS — I5042 Chronic combined systolic (congestive) and diastolic (congestive) heart failure: Secondary | ICD-10-CM

## 2024-02-04 DIAGNOSIS — Z8673 Personal history of transient ischemic attack (TIA), and cerebral infarction without residual deficits: Secondary | ICD-10-CM

## 2024-02-04 NOTE — Telephone Encounter (Signed)
 Pt aware, agreeable, and verbalized understanding

## 2024-03-07 ENCOUNTER — Other Ambulatory Visit (HOSPITAL_COMMUNITY): Payer: Self-pay

## 2024-04-11 ENCOUNTER — Other Ambulatory Visit: Payer: Self-pay

## 2024-04-25 ENCOUNTER — Telehealth (HOSPITAL_COMMUNITY): Payer: Self-pay

## 2024-04-25 NOTE — Telephone Encounter (Signed)
 Called to confirm/remind patient of their appointment at the [facility] on 04/26/24.   Appointment:   [] Confirmed  [] Left mess   [x] No answer/No voice mail  [] VM Full/unable to leave message  [] Phone not in service

## 2024-04-26 ENCOUNTER — Ambulatory Visit (HOSPITAL_COMMUNITY): Payer: Self-pay | Admitting: Family Medicine

## 2024-04-26 ENCOUNTER — Ambulatory Visit (HOSPITAL_COMMUNITY)
Admission: RE | Admit: 2024-04-26 | Discharge: 2024-04-26 | Disposition: A | Discharge status: Home / Self Care | Source: Ambulatory Visit | Attending: Family Medicine | Admitting: Family Medicine

## 2024-04-26 ENCOUNTER — Encounter (HOSPITAL_COMMUNITY): Payer: Self-pay

## 2024-04-26 VITALS — BP 120/80 | HR 70 | Wt 203.8 lb

## 2024-04-26 DIAGNOSIS — Z8673 Personal history of transient ischemic attack (TIA), and cerebral infarction without residual deficits: Secondary | ICD-10-CM | POA: Diagnosis not present

## 2024-04-26 DIAGNOSIS — I2510 Atherosclerotic heart disease of native coronary artery without angina pectoris: Secondary | ICD-10-CM | POA: Insufficient documentation

## 2024-04-26 DIAGNOSIS — I252 Old myocardial infarction: Secondary | ICD-10-CM | POA: Insufficient documentation

## 2024-04-26 DIAGNOSIS — N183 Chronic kidney disease, stage 3 unspecified: Secondary | ICD-10-CM | POA: Diagnosis not present

## 2024-04-26 DIAGNOSIS — I130 Hypertensive heart and chronic kidney disease with heart failure and stage 1 through stage 4 chronic kidney disease, or unspecified chronic kidney disease: Secondary | ICD-10-CM | POA: Diagnosis not present

## 2024-04-26 DIAGNOSIS — I480 Paroxysmal atrial fibrillation: Secondary | ICD-10-CM | POA: Insufficient documentation

## 2024-04-26 DIAGNOSIS — Z951 Presence of aortocoronary bypass graft: Secondary | ICD-10-CM | POA: Insufficient documentation

## 2024-04-26 DIAGNOSIS — F17200 Nicotine dependence, unspecified, uncomplicated: Secondary | ICD-10-CM

## 2024-04-26 DIAGNOSIS — G4733 Obstructive sleep apnea (adult) (pediatric): Secondary | ICD-10-CM | POA: Diagnosis not present

## 2024-04-26 DIAGNOSIS — I4891 Unspecified atrial fibrillation: Secondary | ICD-10-CM | POA: Diagnosis not present

## 2024-04-26 DIAGNOSIS — Z7901 Long term (current) use of anticoagulants: Secondary | ICD-10-CM | POA: Insufficient documentation

## 2024-04-26 DIAGNOSIS — I340 Nonrheumatic mitral (valve) insufficiency: Secondary | ICD-10-CM | POA: Diagnosis not present

## 2024-04-26 DIAGNOSIS — Z79899 Other long term (current) drug therapy: Secondary | ICD-10-CM | POA: Insufficient documentation

## 2024-04-26 DIAGNOSIS — N1832 Chronic kidney disease, stage 3b: Secondary | ICD-10-CM

## 2024-04-26 DIAGNOSIS — I255 Ischemic cardiomyopathy: Secondary | ICD-10-CM | POA: Diagnosis not present

## 2024-04-26 DIAGNOSIS — I491 Atrial premature depolarization: Secondary | ICD-10-CM | POA: Insufficient documentation

## 2024-04-26 DIAGNOSIS — Z7982 Long term (current) use of aspirin: Secondary | ICD-10-CM | POA: Diagnosis not present

## 2024-04-26 DIAGNOSIS — I5022 Chronic systolic (congestive) heart failure: Secondary | ICD-10-CM | POA: Diagnosis not present

## 2024-04-26 DIAGNOSIS — F17210 Nicotine dependence, cigarettes, uncomplicated: Secondary | ICD-10-CM | POA: Diagnosis not present

## 2024-04-26 DIAGNOSIS — I4892 Unspecified atrial flutter: Secondary | ICD-10-CM | POA: Diagnosis not present

## 2024-04-26 DIAGNOSIS — I10 Essential (primary) hypertension: Secondary | ICD-10-CM

## 2024-04-26 LAB — BASIC METABOLIC PANEL WITH GFR
Anion gap: 10 (ref 5–15)
BUN: 24 mg/dL — ABNORMAL HIGH (ref 8–23)
CO2: 24 mmol/L (ref 22–32)
Calcium: 9 mg/dL (ref 8.9–10.3)
Chloride: 104 mmol/L (ref 98–111)
Creatinine, Ser: 1.3 mg/dL — ABNORMAL HIGH (ref 0.61–1.24)
GFR, Estimated: 57 mL/min — ABNORMAL LOW (ref >60)
Glucose, Bld: 115 mg/dL — ABNORMAL HIGH (ref 70–99)
Potassium: 4.3 mmol/L (ref 3.5–5.1)
Sodium: 138 mmol/L (ref 135–145)

## 2024-04-26 LAB — CBC
HCT: 45.9 % (ref 39.0–52.0)
Hemoglobin: 14.8 g/dL (ref 13.0–17.0)
MCH: 30 pg (ref 26.0–34.0)
MCHC: 32.2 g/dL (ref 30.0–36.0)
MCV: 93.1 fL (ref 80.0–100.0)
Platelets: 214 K/uL (ref 150–400)
RBC: 4.93 MIL/uL (ref 4.22–5.81)
RDW: 14.4 % (ref 11.5–15.5)
WBC: 9.7 K/uL (ref 4.0–10.5)
nRBC: 0 % (ref 0.0–0.2)

## 2024-04-26 NOTE — Progress Notes (Signed)
 Date:  04/26/2024   ID:  Stanley Wells, DOB 1946/01/01, MRN [id number]   Provider location: [facility] Type of Visit: Established patient   PCP:  [name], DO  Cardiologist: Dr. Rolan   HPI: Stanley Wells is a 78 y.o. male who has a history of CAD, ischemic cardiomyopathy/chronic systolic CHF, severe functional mitral regurgitation, and persistent atrial fibrillation.  Patient had MI then CABG in 2003.  Cardiac cath in 2015 showed only LIMA-LAD and SVG-OM patent.  In 2015, EF was 30-35%.  He had atrial flutter in 2015 and again in 11/16, both times was cardioverted.  After the 11/16 cardioversion, he stopped all his meds.  He was admitted again in 12/16 with atrial fibrillation/RVR and a CHF exacerbation.  He was diuresed and started on Eliquis  with plan for DCCV after 4 weeks of anticoagulation.  He was brought back at the end of December for cardioversion, but this was unsuccessful.  TEE in 12/16 showed EF 20-25% with severe functional MR.     He was seen by [name] with plan for tentative plan for mitral valve repair versus replacement and possible tricuspid valve repair along with MAZE procedure.  He was referred to CHF clinic for optimization prior to surgery.    He went for left and right heart cath. RHC showed good left and right heart filling pressures but cardiac output was low. Coronary angiography was stable with patent LIMA-LAD and SVG-OM.  Other SVGs occluded.     He had DCCV in 3/17 on amiodarone , this was later stopped.  He had a CPX in 3/17 with only mild functional impairment from CHF.     He had appendicitis with appendectomy in 12/17.    Evaluated in [facility] in 07/23/2018. He was in atrial flutter in the setting of a URI and underwent DC-CV.  He was put back on amiodarone . Amiodarone  later stopped.   Echo in 7/20 showed EF 45-50% with septal bounce on my review with mild LV dilation, normal RV size and systolic function, IVC normal.    Echo in 8/21 showed EF 45-50% with mild LVH, diffuse hypokinesis, normal RV, no MR.   Patient had atrial fibrillation ablation in 9/21.  He had ERAF and DCCV in 10/21.    Patient was admitted in 6/22 with left-sided weakness, found to have lacunar infarct posterior limb of right internal capsule.  Echo in 6/22 showed EF 60-65%, severe LVH, normal RV, no MR.  CTA neck in 6/22 showed < 50% ICA stenosis. He was started on ASA 81 in addition to Eliquis  (had been taking Eliquis  regularly).   Echo 9/23 EF 50%, mild LVH and mild LV dilation, mildly decreased RV systolic function, normal RV, trivial MR. Echo in 10/24 showed EF 50-55%, mild LVH, normal RV, trivial MR.   Admitted 1/24 with acute gastroenteritis vs food poisoning.  AKI 2/2 major GI losses. Symptoms improved almost on admission day. NAGMA resolved with bicarb gtt.  Today he returns for HF follow up with his wife. Overall feeling fine. Denies increasing SOB, palpitations, abnormal bleeding, CP, dizziness, edema, or PND/Orthopnea. Appetite ok. Weight at home 200 pounds. Taking all medications. Not on CPAP, and not interested. Smokes 2 cigarettes a day, wife worried about his BP. He does not check BP routinely at home.  ECG (personally reviewed): NSR, PAC  Labs (9/24): LDL 48 Labs (2/25): BNP 73, K 4.3, creatinine 1.19 Labs (3/25): K 4.4, creatinine 1.20, LDL 52  PMH: 1. CAD: s/p MI in 2003.  CABG x 5 in 2003 with LIMA-LAD, SVG-D1, SVG-OM2, seq SVG-PDA/PLV.  LHC/RHC 11/15 with native arteries essentially occluded.  SVG-D1 and seq SVG-PDA/PLV occluded, LIMA and SVG-OM patent.  LHC (1/17) with patent LIMA-LAD, occluded SVG-D, patent SVG-OM => SVG touches down on inferior branch of OM1 that backfills to superior branch of OM1, there are serial 70-80% stenoses in the proximal inferior OM1 branch that may compromise backflow to superior branch OM1, SVG-PDA/PLV totally occluded, EF 25% . 2. Atrial flutter: DCCV 2015.  Recurred 11/16 with DCCV.    3. Chronic systolic CHF: Ischemic cardiomyopathy.  Echo (2015) with moderate-severe MR, EF 30-35%.  TEE (12/16) with severe central MR (functional), EF 20-25% with diffuse hypokinesis, moderately decreased RV systolic function, moderate TR.  RHC (1/17) with mean RA 5, PA 27/12 mean 18, mean PCWP 10, CI 1.7 Fick/1.83 thermo. EF 25% on 1/17 LV-gram.  Cardiac MRI (2/17) with EF 19%, dilated LV, images not adequate to assess delayed enhancement.  - CPX (3/17) with peak VO2 20.4, VE/VCO2 slope 34, RER 1.11 => mild functional impairment.  - Echo (5/17) with EF 40-45%, trivial MR, mild RV dilation with low normal RV systolic function.  - Echo (5/18) with EF 40-45%, basal to mid inferior HK, moderate LVH, trivial MR.  - Echo (5/19) with EF 55-60%, no significant MR.  - Echo (7/20) with EF 45-50% on my review with mild LV dilation, normal RV size and systolic function, IVC normal, trivial MR.   - Echo (8/21) with EF 45-50% with mild LVH, diffuse hypokinesis, normal RV, no MR.  - Echo (6/22): EF 60-65%, severe LVH, normal RV, no MR - Echo (9/23): EF 50%, mild LVH and mild LV dilation, mildly decreased RV systolic function, normal RV, trivial MR. - Echo (10/24): EF 50-55%, mild LVH, normal RV, trivial MR.  4. Mitral regurgitation: Severe on 12/16 TEE, but was only trivial on echo in 5/17 and 5/18 with aggressive medical treatment. 5/19 echo with no significant MR. 8/21 echo with no significant MR.  - Echo in 10/24 with trivial MR.  5. OSA: Not using CPAP.  6. HTN 7. Nephrolithiasis 8. Atrial fibrillation/flutter: Noted in 12/16 initially.  Paroxysmal.  Had DCCV to NSR in 3/17.  - Atrial flutter 12/19 => DCCV to NSR, briefly on amiodarone .  - Atrial fibrillation ablation 9/21.  - DCCV to NSR 10/21 9. PFTs (1/17) with minimal obstructive airways disease but severely decreased DLCO (37% predicted), possibly suggestive of pulmonary vascular disease.  10. ABIs (5/17) were normal.  11. CKD 12. Appendectomy  12/17. 13. CVA: 6/22. Lacunar infarct posterior limb of right internal capsule.  CTA neck in 6/22 showed < 50% ICA stenosis. - Carotid dopplers (6/23) with mild BICA stenosis.  - Carotid dopplers (6/24) with mild BICA stenosis.  14. B12 deficiency   Current Outpatient Medications  Medication Sig Dispense Refill   apixaban  (ELIQUIS ) 5 MG TABS tablet Take 1 tablet (5 mg total) by mouth 2 (two) times daily. 180 tablet 3   aspirin  EC 81 MG EC tablet Take 1 tablet (81 mg total) by mouth daily. Swallow whole. 30 tablet 11   isosorbide  mononitrate (IMDUR ) 30 MG 24 hr tablet Take 1 tablet (30 mg total) by mouth daily. 30 tablet 5   metoprolol  succinate (TOPROL -XL) 25 MG 24 hr tablet Take 1 tablet (25 mg total) by mouth 2 (two) times daily. Take with or immediately following a meal. 60 tablet 5   rosuvastatin  (CRESTOR )  40 MG tablet Take 1 tablet (40 mg total) by mouth daily. 90 tablet 3   sacubitril -valsartan  (ENTRESTO ) 97-103 MG Take 1 tablet by mouth 2 (two) times daily for heart failure and high blood pressure 180 tablet 1   No current facility-administered medications for this encounter.    Allergies:   Patient has no known allergies.   Social History:  The patient  reports that he has been smoking cigarettes. He started smoking about 62 years ago. He has never used smokeless tobacco. He reports current alcohol use. He reports that he does not use drugs.   Family History:  The patient's family history includes Cancer in his father and mother.   ROS:  Please see the history of present illness.   All other systems are personally reviewed and negative.   Wt Readings from Last 3 Encounters:  04/26/24 92.4 kg (203 lb 12.8 oz)  10/27/23 93.2 kg (205 lb 6.4 oz)  09/10/23 93.3 kg (205 lb 9.6 oz)   BP 120/80   Pulse 70   Wt 92.4 kg (203 lb 12.8 oz)   SpO2 96%   BMI 28.42 kg/m   PHYSICAL EXAM:  General:  NAD. No resp difficulty, walked into clinic HEENT: Normal Neck: Supple. No JVD. Cor:  Regular rate & rhythm. No rubs, gallops or murmurs. Lungs: Clear, diminished in bases Abdomen: Soft, nontender, nondistended.  Extremities: No cyanosis, clubbing, rash, edema Neuro: Alert & oriented x 3, moves all 4 extremities w/o difficulty. Affect pleasant.  ASSESSMENT AND PLAN: 1. Chronic systolic CHF: EF 79-74% on TEE from 12/16.  cMRI 2/17 with EF 19%, unable to assess for delayed enhancement on this study.  Ischemic cardiomyopathy.  Low cardiac output by RHC in 1/17 but filling pressures optimized.  Despite low output on that RHC, he has been doing quite well with medical treatment. CPX (3/17) with only mild functional limitation.  LV EF has improved over the last few years.  Echo 10/24 with EF 50-55%, no RWMA, RV normal, trivial MR.  NYHA class I-II, not volume overloaded on exam.  - Continue Toprol  XL 25 mg bid.  - Continue Entresto  97/103 bid.  BMET today. - Spironolactone  previously stopped due to hyperkalemia.    - Does not need daily loop diuretic.  - Update echo next visit 2. CAD: s/p CABG.  On 1/17 cath, only LIMA-LAD and SVG-OM2 still patent. No chest pain.  - Continue ASA 81 daily.  - Continue Crestor  40 mg.  3. Atrial fibrillation/flutter: Paroxysmal.  Successful DCCV in 3/17 on amiodarone .  Amiodarone  stopped, then he had atrial flutter in 12/19 that was cardioverted back to NSR and amiodarone  was restarted transiently.  He had atrial fibrillation ablation in 9/21 and DCCV again in 10/21. He had a CVA in 6/22 and reports compliance with Eliquis .  Now taking both ASA 81 and Eliquis . In NSR today.  - Continue apixaban  + ASA 81 mg. No bleeding issues. CBC today. 4. Mitral regurgitation: Severe by 12/16 TEE, probably functional.  Much improved on 5/17 echo, only trivial.  Trivial on 5/18 echo. No significant MR on echo 12/2017. Trivial on 7/20 echo. Minimal MR on 8/21 echo. Minimal MR on 6/22 echo. Trivial MR on 9/23 echo. Trivial MR on 10/24 echo 5. CKD: Stage III. Creatinine  recently improved.  - BMET today.   6. HTN: BP is controlled today.  Wife is concerned that it is high at times.  - Check BP at home, notify clinic if BP consistently > 120/80. -  Discussed treating OSA to help manage BP 7. Smoking: discussed cessation.  Follow up 6 months with Dr. Rolan + echo  Signed, Harlene CHRISTELLA Gainer, [place]  04/26/2024  [facility] [address] [facility] [place] [address] [phone] (office) [phone] (fax)

## 2024-04-26 NOTE — Addendum Note (Signed)
 Encounter addended by: Islah Eve M, RN on: 04/26/2024 10:13 AM  Actions taken: Charge Capture section accepted

## 2024-04-26 NOTE — Patient Instructions (Addendum)
 Good to see you today!  Labs done today, your results will be available in MyChart, we will contact you for abnormal readings.  Your physician has requested that you have an echocardiogram. Echocardiography is a painless test that uses sound waves to create images of your heart. It provides your doctor with information about the size and shape of your heart and how well your heart's chambers and valves are working. This procedure takes approximately one hour. There are no restrictions for this procedure. Please do NOT wear cologne, perfume, aftershave, or lotions (deodorant is allowed). Please arrive 15 minutes prior to your appointment time.  Please note: We ask at that you not bring children with you during ultrasound (echo/ vascular) testing. Due to room size and safety concerns, children are not allowed in the ultrasound rooms during exams. Our front office staff cannot provide observation of children in our lobby area while testing is being conducted. An adult accompanying a patient to their appointment will only be allowed in the ultrasound room at the discretion of the ultrasound technician under special circumstances. We apologize for any inconvenience.  Notify clinic if b/p consistently greater than 120/80  Your physician recommends that you schedule a follow-up appointment 6 months with echocardiogram(March) Call office in January to schedule an appointment  If you have any questions or concerns before your next appointment please send us  a message through [place] or call our office at [phone].    TO LEAVE A MESSAGE FOR THE NURSE SELECT OPTION 2, PLEASE LEAVE A MESSAGE INCLUDING: YOUR NAME DATE OF BIRTH CALL BACK NUMBER REASON FOR CALL**this is important as we prioritize the call backs  YOU WILL RECEIVE A CALL BACK THE SAME DAY AS LONG AS YOU CALL BEFORE 4:00 PM At the [facility], you and your health needs are our priority. As part of our continuing mission to  provide you with exceptional heart care, we have created designated Provider Care Teams. These Care Teams include your primary Cardiologist (physician) and Advanced Practice Providers (APPs- Physician Assistants and Nurse Practitioners) who all work together to provide you with the care you need, when you need it.   You may see any of the following providers on your designated Care Team at your next follow up: Dr Toribio Fuel Dr Ezra Shuck Dr. Ria Commander Dr. Morene Brownie Amy Lenetta, NP Caffie Shed, [place] [facility] [place], [place] Beckey Coe, NP [place] Lee, NP Ellouise Class, NP Tinnie Redman, PharmD Jaun Bash, PharmD   Please be sure to bring in all your medications bottles to every appointment.    Thank you for choosing [facility]

## 2024-05-02 ENCOUNTER — Other Ambulatory Visit (HOSPITAL_COMMUNITY): Payer: Self-pay

## 2024-05-09 ENCOUNTER — Other Ambulatory Visit (HOSPITAL_COMMUNITY): Payer: Self-pay

## 2024-06-07 ENCOUNTER — Other Ambulatory Visit (HOSPITAL_COMMUNITY): Payer: Self-pay | Admitting: Cardiology

## 2024-06-07 ENCOUNTER — Other Ambulatory Visit (HOSPITAL_COMMUNITY): Payer: Self-pay

## 2024-06-07 MED ORDER — APIXABAN 5 MG PO TABS
5.0000 mg | ORAL_TABLET | Freq: Two times a day (BID) | ORAL | 3 refills | Status: AC
  Filled 2024-06-07: qty 180, 90d supply, fill #0
  Filled 2024-09-07: qty 180, 90d supply, fill #1

## 2024-07-11 ENCOUNTER — Other Ambulatory Visit (HOSPITAL_COMMUNITY): Payer: Self-pay

## 2024-07-11 ENCOUNTER — Other Ambulatory Visit: Payer: Self-pay

## 2024-07-11 MED ORDER — SACUBITRIL-VALSARTAN 97-103 MG PO TABS
1.0000 | ORAL_TABLET | Freq: Two times a day (BID) | ORAL | 0 refills | Status: AC
  Filled 2024-07-11 – 2024-08-01 (×2): qty 180, 90d supply, fill #0

## 2024-07-12 ENCOUNTER — Other Ambulatory Visit (HOSPITAL_COMMUNITY): Payer: Self-pay

## 2024-07-15 ENCOUNTER — Other Ambulatory Visit: Payer: Self-pay

## 2024-07-15 ENCOUNTER — Other Ambulatory Visit (HOSPITAL_COMMUNITY): Payer: Self-pay

## 2024-07-15 ENCOUNTER — Other Ambulatory Visit (HOSPITAL_COMMUNITY): Payer: Self-pay | Admitting: Cardiology

## 2024-07-15 DIAGNOSIS — I5042 Chronic combined systolic (congestive) and diastolic (congestive) heart failure: Secondary | ICD-10-CM

## 2024-07-15 MED ORDER — ISOSORBIDE MONONITRATE ER 30 MG PO TB24
30.0000 mg | ORAL_TABLET | Freq: Every day | ORAL | 11 refills | Status: AC
  Filled 2024-07-15: qty 30, 30d supply, fill #0
  Filled 2024-08-12: qty 30, 30d supply, fill #1
  Filled 2024-09-07: qty 30, 30d supply, fill #2

## 2024-07-15 MED ORDER — METOPROLOL SUCCINATE ER 25 MG PO TB24
25.0000 mg | ORAL_TABLET | Freq: Two times a day (BID) | ORAL | 11 refills | Status: AC
  Filled 2024-07-15: qty 60, 30d supply, fill #0
  Filled 2024-08-12: qty 60, 30d supply, fill #1
  Filled 2024-09-07: qty 60, 30d supply, fill #2

## 2024-07-19 ENCOUNTER — Other Ambulatory Visit (HOSPITAL_COMMUNITY): Payer: Self-pay

## 2024-08-01 ENCOUNTER — Other Ambulatory Visit (HOSPITAL_COMMUNITY): Payer: Self-pay

## 2024-08-01 ENCOUNTER — Other Ambulatory Visit: Payer: Self-pay

## 2024-08-03 ENCOUNTER — Other Ambulatory Visit (HOSPITAL_COMMUNITY): Payer: Self-pay

## 2024-08-11 ENCOUNTER — Other Ambulatory Visit: Payer: Self-pay

## 2024-08-12 ENCOUNTER — Other Ambulatory Visit (HOSPITAL_COMMUNITY): Payer: Self-pay

## 2024-09-02 ENCOUNTER — Other Ambulatory Visit (HOSPITAL_COMMUNITY): Payer: Self-pay

## 2024-09-02 ENCOUNTER — Telehealth (HOSPITAL_COMMUNITY): Payer: Self-pay

## 2024-09-02 NOTE — Telephone Encounter (Signed)
 [facility] Patient Advocate Encounter  This patient was approved for a [employer] grant that will help cover the cost of Eliquis , Entresto , Metoprolol .  Total amount awarded, $7,500.  Effective: 08/27/2024 - 08/26/2025.  BIN [id number] PCN [id number] [id number] ID [id number]  Pharmacy provided with approval and processing information. Patient informed via phone.  Rachel DEL, CPhT Rx Patient Advocate Phone: [phone]

## 2024-09-07 ENCOUNTER — Other Ambulatory Visit (HOSPITAL_COMMUNITY): Payer: Self-pay

## 2024-09-07 ENCOUNTER — Other Ambulatory Visit: Payer: Self-pay

## 2024-09-08 ENCOUNTER — Other Ambulatory Visit: Payer: Self-pay

## 2024-10-26 ENCOUNTER — Ambulatory Visit (HOSPITAL_COMMUNITY): Admitting: Cardiology

## 2024-10-26 ENCOUNTER — Other Ambulatory Visit (HOSPITAL_COMMUNITY)
